# Patient Record
Sex: Female | Born: 1965 | Race: White | Hispanic: No | Marital: Single | Smoking: Current every day smoker
Health system: Southern US, Community
[De-identification: ages and names within clinical notes are randomized; demographics above are authoritative.]

## PROBLEM LIST (undated history)

## (undated) DIAGNOSIS — I5031 Acute diastolic (congestive) heart failure: Secondary | ICD-10-CM

## (undated) DIAGNOSIS — I10 Essential (primary) hypertension: Secondary | ICD-10-CM

## (undated) DIAGNOSIS — C649 Malignant neoplasm of unspecified kidney, except renal pelvis: Secondary | ICD-10-CM

## (undated) DIAGNOSIS — R011 Cardiac murmur, unspecified: Secondary | ICD-10-CM

## (undated) DIAGNOSIS — C719 Malignant neoplasm of brain, unspecified: Secondary | ICD-10-CM

## (undated) DIAGNOSIS — C169 Malignant neoplasm of stomach, unspecified: Secondary | ICD-10-CM

## (undated) DIAGNOSIS — I739 Peripheral vascular disease, unspecified: Secondary | ICD-10-CM

## (undated) DIAGNOSIS — E119 Type 2 diabetes mellitus without complications: Secondary | ICD-10-CM

## (undated) DIAGNOSIS — I6523 Occlusion and stenosis of bilateral carotid arteries: Secondary | ICD-10-CM

## (undated) HISTORY — PX: CHOLECYSTECTOMY: SHX55

---

## 2001-06-04 ENCOUNTER — Encounter: Payer: Self-pay | Admitting: Pediatrics

## 2001-06-04 ENCOUNTER — Ambulatory Visit (HOSPITAL_COMMUNITY): Admission: RE | Admit: 2001-06-04 | Discharge: 2001-06-04 | Payer: Self-pay | Admitting: Pediatrics

## 2002-05-01 ENCOUNTER — Encounter: Payer: Self-pay | Admitting: Emergency Medicine

## 2002-05-01 ENCOUNTER — Emergency Department (HOSPITAL_COMMUNITY): Admission: EM | Admit: 2002-05-01 | Discharge: 2002-05-01 | Payer: Self-pay | Admitting: Emergency Medicine

## 2002-05-01 IMAGING — CT CT PELVIS W/O CM
1 series · 15 of 32 positions shown, 19 images · non-contrast
Comparison: none

FINDINGS
CLINICAL DATA: RIGHT FLANK PAIN, NAUSEA, VOMITING.
CT ABDOMEN AND PELVIS WITHOUT CONTRAST (URINARY CALCULUS PROTOCOL)
TECHNIQUE: NEITHER INTRAVENOUS NOR ORAL CONTRAST WERE ADMINISTERED.  HELICAL CT THROUGH THE
ABDOMEN AND PELVIS WAS PERFORMED AT 5 MM SLICE THICKNESS TO INCLUDE THE KIDNEYS, URETERS AND
BLADDER.  THERE ARE NO PRIOR CT STUDIES OF THE ABDOMEN OR PELVIS AVAILABLE FOR COMPARISON.
CT ABDOMEN W/O CONTRAST
BILATERAL LOWER POLE RENAL CALCULI ARE PRESENT.  THERE IS MODERATE TO SEVERE HYDRONEPHROSIS
INVOLVING THE RIGHT KIDNEY, AND THE RIGHT KIDNEY IS EDEMATOUS.  THERE IS SIGNIFICANT PERINEPHRIC
EDEMA AS WELL.  NO PROXIMAL URETERAL CALCULI ARE IDENTIFIED.
THE GALLBLADDER IS SURGICALLY ABSENT.  WITHIN THE LIMITS OF THE UNENHANCED TECHNIQUE, THE REMAINDER
OF THE UPPER ABDOMEN IS UNREMARKABLE.
IMPRESSION
1.  OBSTRUCTION OF THE RIGHT UPPER URINARY TRACT.
2.  BILATERAL LOWER POLE RENAL CALCULI.
CT PELVIS W/O CONTRAST
NO DISTAL RIGHT URETERAL CALCULI ARE IDENTIFIED.  THERE IS AN APPROXIMATE 2 MM CALCULUS IN THE
BLADDER, CONSISTENT WITH RECENT PASSAGE.  THE UTERUS AND ADNEXA ARE UNREMARKABLE FOR AGE.  THE
COLON AND SMALL BOWEL ARE UNREMARKABLE.
RECENTLY PASSED RIGHT URINARY CALCULUS, WITH THE CALCULUS NOW PRESENT IN THE BLADDER.

[Series 1363: — · axial · 0.90mm/px · z∈[+1211,+1566]mm · 15 of 80 slices shown, 19 images]
[im 6/80  soft-tissue]
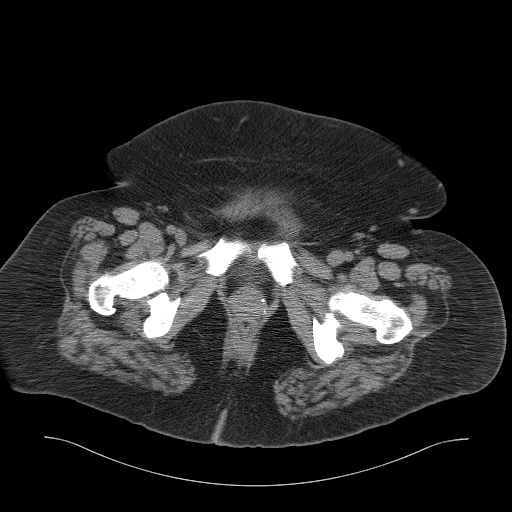
[im 6/80  bone]
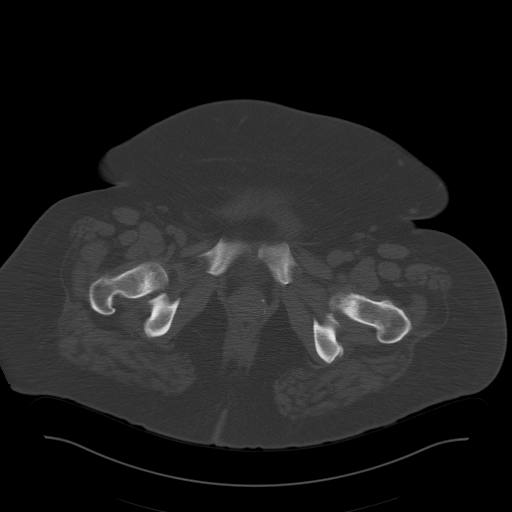
[im 11/80  soft-tissue]
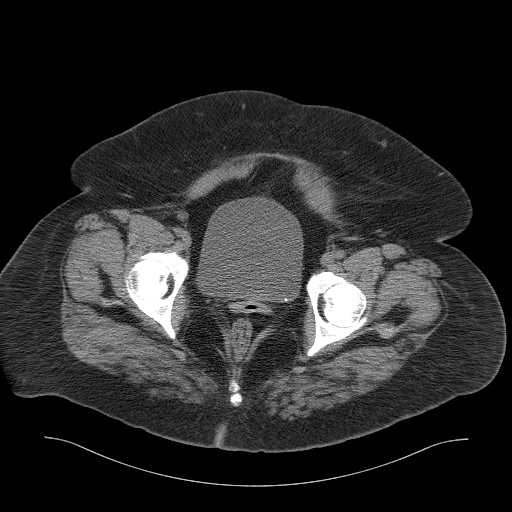
[im 16/80  soft-tissue]
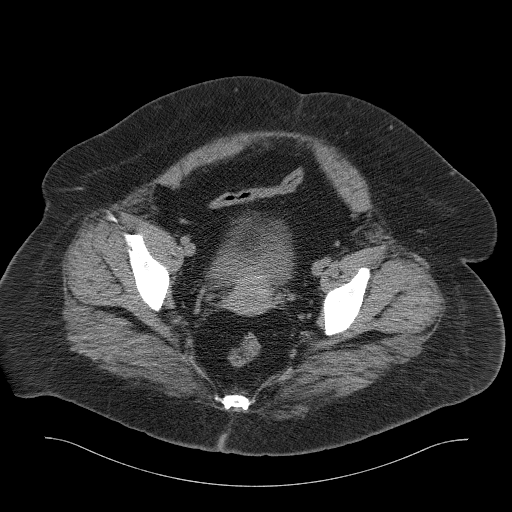
[im 23/80  soft-tissue]
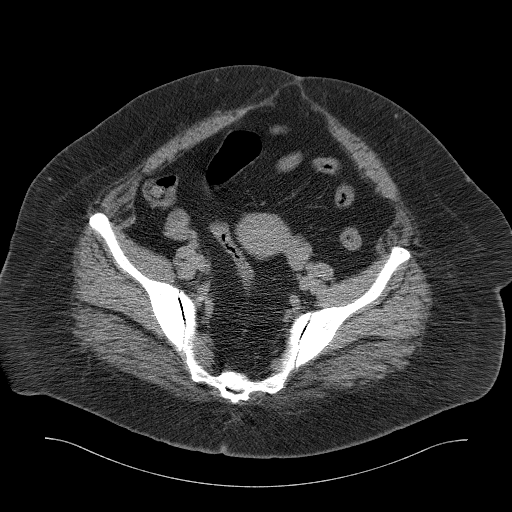
[im 29/80  soft-tissue]
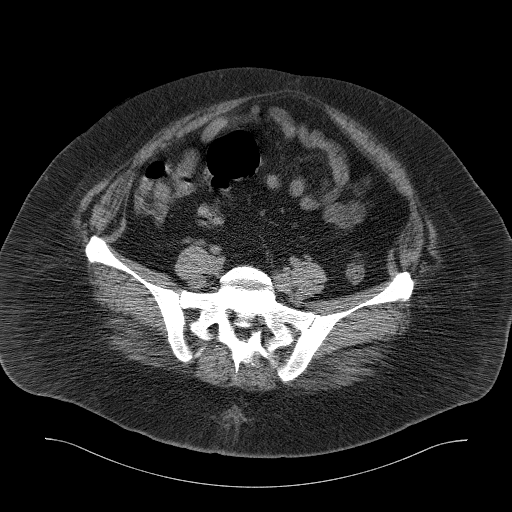
[im 34/80  soft-tissue]
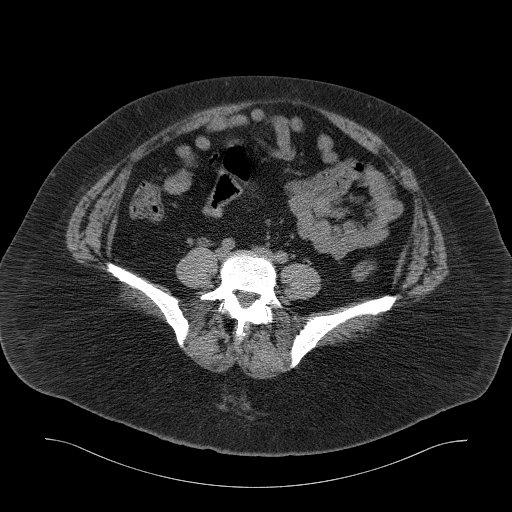
[im 41/80  soft-tissue]
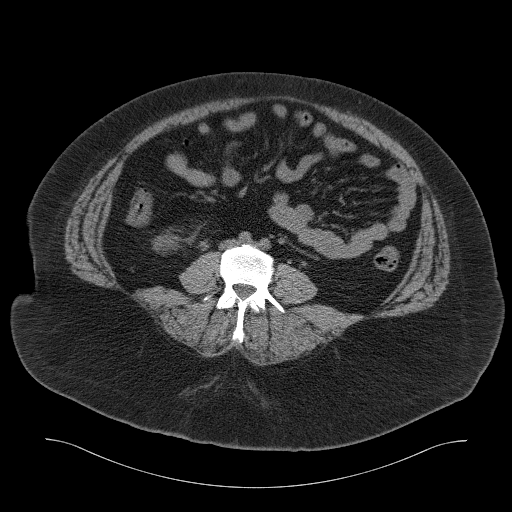
[im 46/80  soft-tissue]
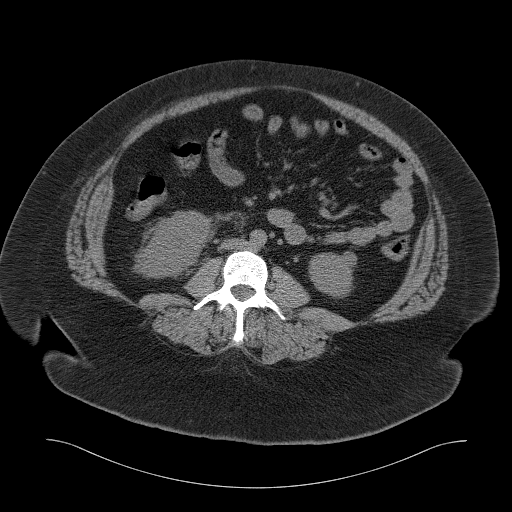
[im 51/80  soft-tissue]
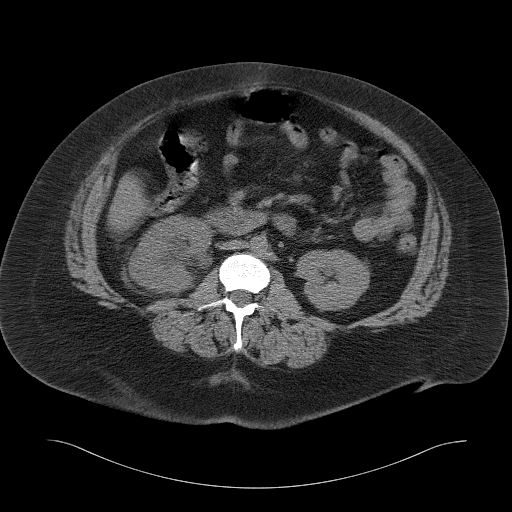
[im 51/80  bone]
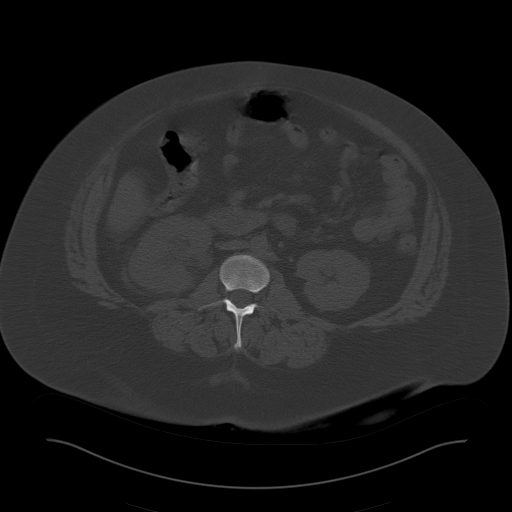
[im 57/80  soft-tissue]
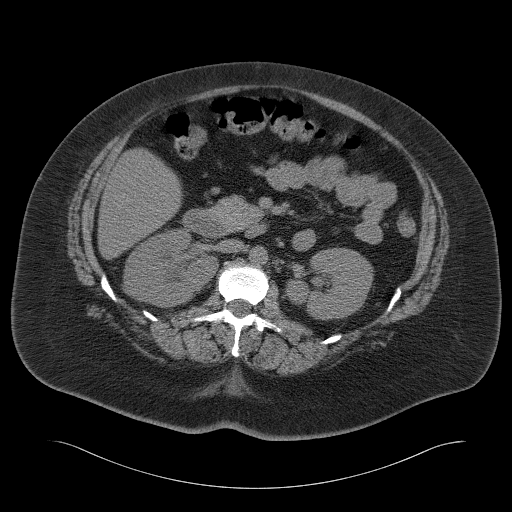
[im 64/80  soft-tissue]
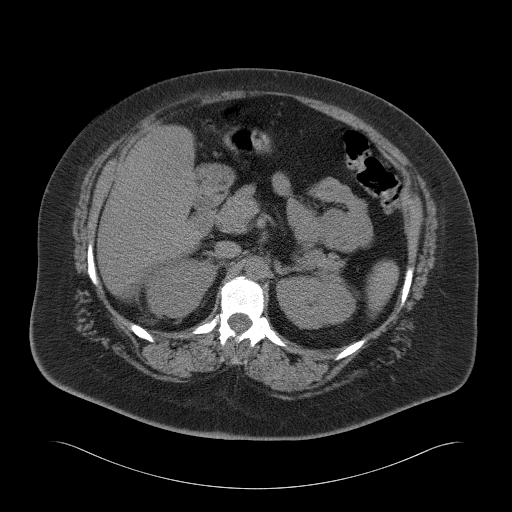
[im 69/80  soft-tissue]
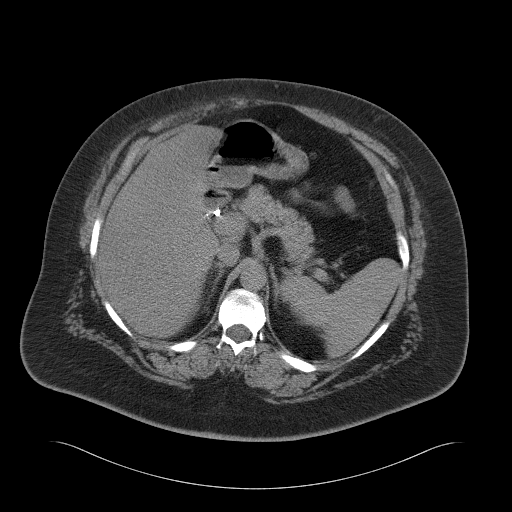
[im 69/80  lung]
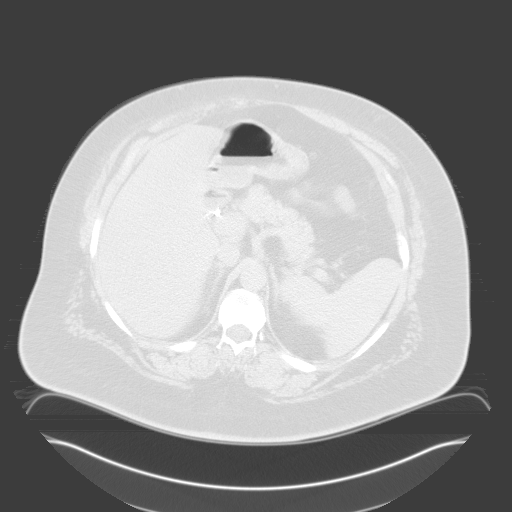
[im 72/80  lung]
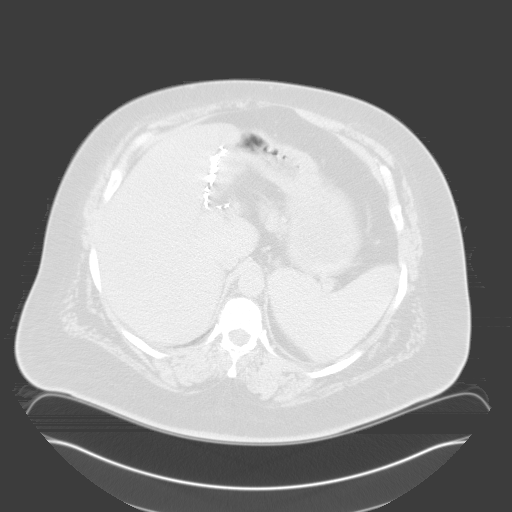
[im 74/80  soft-tissue]
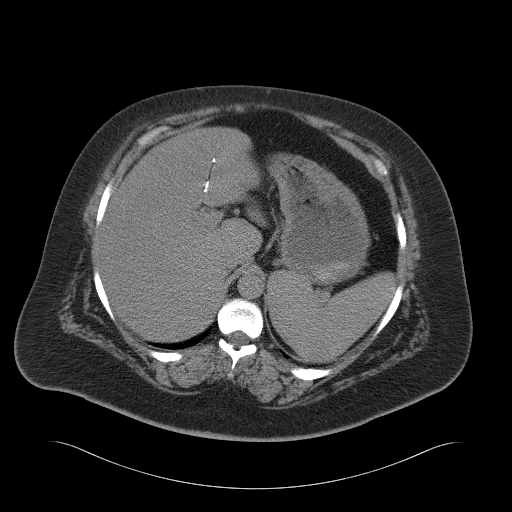
[im 74/80  lung]
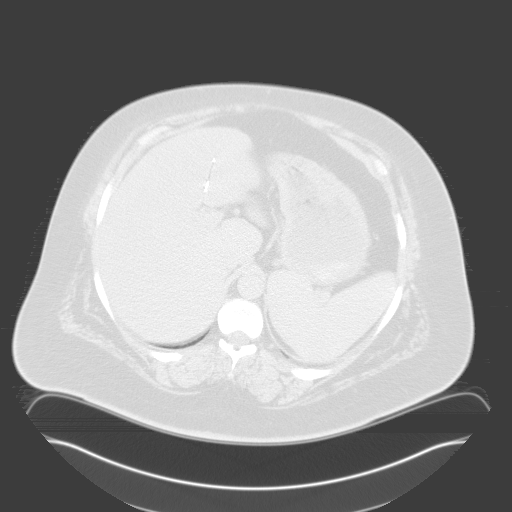
[im 77/80  lung]
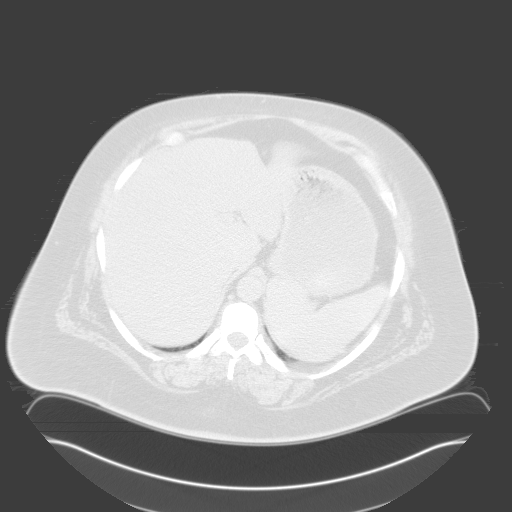

[15 of 32 positions shown; findings below may reference images not displayed]

## 2016-01-04 NOTE — Patient Instructions (Signed)
Natalie Contreras  01/04/2016     @PREFPERIOPPHARMACY @   Your procedure is scheduled on 01/11/16  Report to [facility] at [phone].  Call this number if you have problems the morning of surgery:  [phone]   Remember:  Do not eat food or drink liquids after midnight.  Take these medicines the morning of surgery with A SIP OF WATER zestoretic   Do not wear jewelry, make-up or nail polish.  Do not wear lotions, powders, or perfumes.  You may wear deodorant.  Do not shave 48 hours prior to surgery.  Men may shave face and neck.  Do not bring valuables to the hospital.  [facility] is not responsible for any belongings or valuables.  Contacts, dentures or bridgework may not be worn into surgery.  Leave your suitcase in the car.  After surgery it may be brought to your room.  For patients admitted to the hospital, discharge time will be determined by your treatment team.  Patients discharged the day of surgery will not be allowed to drive home.   Name and phone number of your driver:   family Special instructions:  none  Please read over the following fact sheets that you were given. Anesthesia Post-op Instructions      PATIENT INSTRUCTIONS POST-ANESTHESIA  IMMEDIATELY FOLLOWING SURGERY:  Do not drive or operate machinery for the first twenty four hours after surgery.  Do not make any important decisions for twenty four hours after surgery or while taking narcotic pain medications or sedatives.  If you develop intractable nausea and vomiting or a severe headache please notify your doctor immediately.  FOLLOW-UP:  Please make an appointment with your surgeon as instructed. You do not need to follow up with anesthesia unless specifically instructed to do so.  WOUND CARE INSTRUCTIONS (if applicable):  Keep a dry clean dressing on the anesthesia/puncture wound site if there is drainage.  Once the wound has quit draining you may leave it open to air.  Generally you should leave the  bandage intact for twenty four hours unless there is drainage.  If the epidural site drains for more than 36-48 hours please call the anesthesia department.  QUESTIONS?:  Please feel free to call your physician or the hospital operator if you have any questions, and they will be happy to assist you.      Cataract A cataract is a clouding of the lens of the eye. When a lens becomes cloudy, vision is reduced based on the degree and nature of the clouding. Many cataracts reduce vision to some degree. Some cataracts make people more near-sighted as they develop. Other cataracts increase glare. Cataracts that are ignored and become worse can sometimes look white. The white color can be seen through the pupil. CAUSES   Aging. However, cataracts may occur at any age, even in newborns.  Certain drugs.  Trauma to the eye.  Certain diseases such as diabetes.  Specific eye diseases such as chronic inflammation inside the eye or a sudden attack of a rare form of glaucoma.  Inherited or acquired medical problems. SYMPTOMS   Gradual, progressive drop in vision in the affected eye.  Severe, rapid visual loss. This most often happens when trauma is the cause. DIAGNOSIS  To detect a cataract, an eye doctor examines the lens. Cataracts are best diagnosed with an exam of the eyes with the pupils enlarged (dilated) by drops.  TREATMENT  For an early cataract, vision may improve by using different eyeglasses  or stronger lighting. If that does not help your vision, surgery is the only effective treatment. A cataract needs to be surgically removed when vision loss interferes with your everyday activities, such as driving, reading, or watching TV. A cataract may also have to be removed if it prevents examination or treatment of another eye problem. Surgery removes the cloudy lens and usually replaces it with a substitute lens (intraocular lens, IOL).  At a time when both you and your doctor agree, the cataract  will be surgically removed. If you have cataracts in both eyes, only one is usually removed at a time. This allows the operated eye to heal and be out of danger from any possible problems after surgery (such as infection or poor wound healing). In rare cases, a cataract may be doing damage to your eye. In these cases, your caregiver may advise surgical removal right away. The vast majority of people who have cataract surgery have better vision afterward. HOME CARE INSTRUCTIONS  If you are not planning surgery, you may be asked to do the following:  Use different eyeglasses.  Use stronger or brighter lighting.  Ask your eye doctor about reducing your medicine dose or changing medicines if it is thought that a medicine caused your cataract. Changing medicines does not make the cataract go away on its own.  Become familiar with your surroundings. Poor vision can lead to injury. Avoid bumping into things on the affected side. You are at a higher risk for tripping or falling.  Exercise extreme care when driving or operating machinery.  Wear sunglasses if you are sensitive to bright light or experiencing problems with glare. SEEK IMMEDIATE MEDICAL CARE IF:   You have a worsening or sudden vision loss.  You notice redness, swelling, or increasing pain in the eye.  You have a fever.   This information is not intended to replace advice given to you by your health care provider. Make sure you discuss any questions you have with your health care provider.   Document Released: 09/02/2005 Document Revised: 11/25/2011 Document Reviewed: 03/08/2015 Elsevier Interactive Patient Education [employer].

## 2016-01-08 ENCOUNTER — Encounter (HOSPITAL_COMMUNITY): Payer: Self-pay

## 2016-01-08 ENCOUNTER — Other Ambulatory Visit: Payer: Self-pay

## 2016-01-08 ENCOUNTER — Encounter (HOSPITAL_COMMUNITY)
Admission: RE | Admit: 2016-01-08 | Discharge: 2016-01-08 | Disposition: A | Discharge status: Home / Self Care | Payer: BLUE CROSS/BLUE SHIELD | Source: Ambulatory Visit | Attending: Ophthalmology | Admitting: Ophthalmology

## 2016-01-08 DIAGNOSIS — Z01812 Encounter for preprocedural laboratory examination: Secondary | ICD-10-CM | POA: Diagnosis not present

## 2016-01-08 DIAGNOSIS — Z01810 Encounter for preprocedural cardiovascular examination: Secondary | ICD-10-CM | POA: Insufficient documentation

## 2016-01-08 HISTORY — DX: Essential (primary) hypertension: I10

## 2016-01-08 LAB — CBC WITH DIFFERENTIAL/PLATELET
BASOS ABS: 0 10*3/uL (ref 0.0–0.1)
Basophils Relative: 1 %
EOS PCT: 2 %
Eosinophils Absolute: 0.1 10*3/uL (ref 0.0–0.7)
HCT: 45.1 % (ref 36.0–46.0)
Hemoglobin: 15.6 g/dL — ABNORMAL HIGH (ref 12.0–15.0)
LYMPHS PCT: 28 %
Lymphs Abs: 2 10*3/uL (ref 0.7–4.0)
MCH: 30.3 pg (ref 26.0–34.0)
MCHC: 34.6 g/dL (ref 30.0–36.0)
MCV: 87.6 fL (ref 78.0–100.0)
MONO ABS: 0.3 10*3/uL (ref 0.1–1.0)
MONOS PCT: 4 %
Neutro Abs: 4.6 10*3/uL (ref 1.7–7.7)
Neutrophils Relative %: 65 %
PLATELETS: 300 10*3/uL (ref 150–400)
RBC: 5.15 MIL/uL — ABNORMAL HIGH (ref 3.87–5.11)
RDW: 13.5 % (ref 11.5–15.5)
WBC: 7 10*3/uL (ref 4.0–10.5)

## 2016-01-08 LAB — BASIC METABOLIC PANEL
Anion gap: 10 (ref 5–15)
BUN: 25 mg/dL — ABNORMAL HIGH (ref 6–20)
CO2: 26 mmol/L (ref 22–32)
Calcium: 9.3 mg/dL (ref 8.9–10.3)
Chloride: 97 mmol/L — ABNORMAL LOW (ref 101–111)
Creatinine, Ser: 0.79 mg/dL (ref 0.44–1.00)
GFR calc Af Amer: >60 mL/min (ref >60)
GFR calc non Af Amer: >60 mL/min (ref >60)
Glucose, Bld: 533 mg/dL — ABNORMAL HIGH (ref 65–99)
Potassium: 4.1 mmol/L (ref 3.5–5.1)
Sodium: 133 mmol/L — ABNORMAL LOW (ref 135–145)

## 2016-01-08 LAB — HCG, SERUM, QUALITATIVE: Preg, Serum: NEGATIVE

## 2016-01-08 NOTE — Pre-Procedure Instructions (Signed)
Patient given information to sign up for my chart at home. 

## 2016-01-09 NOTE — Pre-Procedure Instructions (Signed)
Glucose on PAT is 533. Patient denies having diabetes. Dr Rochel Brome made aware of glucose. Patient's surgery will be cancelled pending treatment for diabetes. Called patient's PCP, Richardine Service [phone]. They are to see patient tomorrow 10 January 2016 at 1415  concerning glucose. Called Mrs. Cawley and spoke with her about this appointment as well as cancellation of surgery and reason why. Spoke with her briefly about diabetes, complications as well as diet that should be followed. [place] and spoke with Wannetta Sender and let her know surgery is cancelled for now and that patient will call and rescheduled when Patient's PCP thinks this is ok after being treated for diabetes.

## 2016-01-10 ENCOUNTER — Other Ambulatory Visit (HOSPITAL_COMMUNITY): Payer: Self-pay | Admitting: Nurse Practitioner

## 2016-01-10 DIAGNOSIS — R0989 Other specified symptoms and signs involving the circulatory and respiratory systems: Secondary | ICD-10-CM

## 2016-01-10 DIAGNOSIS — E785 Hyperlipidemia, unspecified: Secondary | ICD-10-CM

## 2016-01-10 DIAGNOSIS — E118 Type 2 diabetes mellitus with unspecified complications: Principal | ICD-10-CM

## 2016-01-10 DIAGNOSIS — IMO0002 Reserved for concepts with insufficient information to code with codable children: Secondary | ICD-10-CM

## 2016-01-10 DIAGNOSIS — E1165 Type 2 diabetes mellitus with hyperglycemia: Secondary | ICD-10-CM

## 2016-01-11 ENCOUNTER — Encounter (HOSPITAL_COMMUNITY): Admission: RE | Payer: Self-pay | Source: Ambulatory Visit

## 2016-01-11 ENCOUNTER — Ambulatory Visit (HOSPITAL_COMMUNITY): Admission: RE | Admit: 2016-01-11 | Payer: BLUE CROSS/BLUE SHIELD | Source: Ambulatory Visit | Admitting: Ophthalmology

## 2016-01-11 SURGERY — PHACOEMULSIFICATION, CATARACT, WITH IOL INSERTION
Anesthesia: Monitor Anesthesia Care | Site: Eye | Laterality: Left

## 2016-01-15 ENCOUNTER — Ambulatory Visit (HOSPITAL_COMMUNITY)
Admission: RE | Admit: 2016-01-15 | Discharge: 2016-01-15 | Disposition: A | Discharge status: Home / Self Care | Payer: BLUE CROSS/BLUE SHIELD | Source: Ambulatory Visit | Attending: Nurse Practitioner | Admitting: Nurse Practitioner

## 2016-01-15 DIAGNOSIS — R0989 Other specified symptoms and signs involving the circulatory and respiratory systems: Secondary | ICD-10-CM | POA: Diagnosis present

## 2016-01-15 DIAGNOSIS — R938 Abnormal findings on diagnostic imaging of other specified body structures: Secondary | ICD-10-CM | POA: Insufficient documentation

## 2016-01-15 DIAGNOSIS — E785 Hyperlipidemia, unspecified: Secondary | ICD-10-CM | POA: Diagnosis present

## 2016-01-15 DIAGNOSIS — E1165 Type 2 diabetes mellitus with hyperglycemia: Secondary | ICD-10-CM

## 2016-01-15 DIAGNOSIS — E118 Type 2 diabetes mellitus with unspecified complications: Secondary | ICD-10-CM

## 2016-01-15 DIAGNOSIS — IMO0002 Reserved for concepts with insufficient information to code with codable children: Secondary | ICD-10-CM

## 2016-02-02 ENCOUNTER — Encounter (HOSPITAL_COMMUNITY): Payer: Self-pay

## 2016-02-05 ENCOUNTER — Encounter (HOSPITAL_COMMUNITY)
Admission: RE | Admit: 2016-02-05 | Discharge: 2016-02-05 | Disposition: A | Discharge status: Home / Self Care | Payer: BLUE CROSS/BLUE SHIELD | Source: Ambulatory Visit | Attending: Ophthalmology | Admitting: Ophthalmology

## 2016-02-05 HISTORY — DX: Type 2 diabetes mellitus without complications: E11.9

## 2016-02-08 ENCOUNTER — Ambulatory Visit (HOSPITAL_COMMUNITY): Payer: BLUE CROSS/BLUE SHIELD | Admitting: Anesthesiology

## 2016-02-08 ENCOUNTER — Encounter (HOSPITAL_COMMUNITY)
Admission: RE | Disposition: A | Discharge status: Home / Self Care | Payer: Self-pay | Source: Ambulatory Visit | Attending: Ophthalmology

## 2016-02-08 ENCOUNTER — Encounter (HOSPITAL_COMMUNITY): Payer: Self-pay | Admitting: *Deleted

## 2016-02-08 ENCOUNTER — Ambulatory Visit (HOSPITAL_COMMUNITY)
Admission: RE | Admit: 2016-02-08 | Discharge: 2016-02-08 | Disposition: A | Discharge status: Home / Self Care | Payer: BLUE CROSS/BLUE SHIELD | Source: Ambulatory Visit | Attending: Ophthalmology | Admitting: Ophthalmology

## 2016-02-08 DIAGNOSIS — Z7982 Long term (current) use of aspirin: Secondary | ICD-10-CM | POA: Insufficient documentation

## 2016-02-08 DIAGNOSIS — F17210 Nicotine dependence, cigarettes, uncomplicated: Secondary | ICD-10-CM | POA: Insufficient documentation

## 2016-02-08 DIAGNOSIS — E119 Type 2 diabetes mellitus without complications: Secondary | ICD-10-CM | POA: Insufficient documentation

## 2016-02-08 DIAGNOSIS — Z7984 Long term (current) use of oral hypoglycemic drugs: Secondary | ICD-10-CM | POA: Insufficient documentation

## 2016-02-08 DIAGNOSIS — H25812 Combined forms of age-related cataract, left eye: Secondary | ICD-10-CM | POA: Insufficient documentation

## 2016-02-08 DIAGNOSIS — Z79899 Other long term (current) drug therapy: Secondary | ICD-10-CM | POA: Diagnosis not present

## 2016-02-08 DIAGNOSIS — I10 Essential (primary) hypertension: Secondary | ICD-10-CM | POA: Insufficient documentation

## 2016-02-08 HISTORY — PX: CATARACT EXTRACTION W/PHACO: SHX586

## 2016-02-08 LAB — PREGNANCY, URINE: Preg Test, Ur: NEGATIVE

## 2016-02-08 LAB — GLUCOSE, CAPILLARY: Glucose-Capillary: 302 mg/dL — ABNORMAL HIGH (ref 65–99)

## 2016-02-08 SURGERY — PHACOEMULSIFICATION, CATARACT, WITH IOL INSERTION
Anesthesia: Monitor Anesthesia Care | Site: Eye | Laterality: Left

## 2016-02-08 MED ORDER — EPINEPHRINE HCL 1 MG/ML IJ SOLN
INTRAMUSCULAR | Status: AC
  Filled 2016-02-08: qty 1

## 2016-02-08 MED ORDER — NEOMYCIN-POLYMYXIN-DEXAMETH 3.5-10000-0.1 OP SUSP
OPHTHALMIC | Status: DC | PRN
  Administered 2016-02-08: 2 [drp] via OPHTHALMIC

## 2016-02-08 MED ORDER — FENTANYL CITRATE (PF) 100 MCG/2ML IJ SOLN
25.0000 ug | INTRAMUSCULAR | Status: AC
  Administered 2016-02-08 (×2): 25 ug via INTRAVENOUS

## 2016-02-08 MED ORDER — POVIDONE-IODINE 5 % OP SOLN
OPHTHALMIC | Status: DC | PRN
  Administered 2016-02-08: 1 via OPHTHALMIC

## 2016-02-08 MED ORDER — PROVISC 10 MG/ML IO SOLN
INTRAOCULAR | Status: DC | PRN
  Administered 2016-02-08: 0.85 mL via INTRAOCULAR

## 2016-02-08 MED ORDER — EPINEPHRINE HCL 1 MG/ML IJ SOLN
INTRAOCULAR | Status: DC | PRN
  Administered 2016-02-08: 500 mL

## 2016-02-08 MED ORDER — LACTATED RINGERS IV SOLN
INTRAVENOUS | Status: DC
  Administered 2016-02-08: 07:00:00 via INTRAVENOUS

## 2016-02-08 MED ORDER — MIDAZOLAM HCL 2 MG/2ML IJ SOLN
1.0000 mg | INTRAMUSCULAR | Status: DC | PRN
  Administered 2016-02-08: 2 mg via INTRAVENOUS

## 2016-02-08 MED ORDER — FENTANYL CITRATE (PF) 100 MCG/2ML IJ SOLN
INTRAMUSCULAR | Status: AC
Start: 2016-02-08 — End: 2016-02-08
  Filled 2016-02-08: qty 2

## 2016-02-08 MED ORDER — PHENYLEPHRINE HCL 2.5 % OP SOLN
1.0000 [drp] | OPHTHALMIC | Status: AC
  Administered 2016-02-08 (×3): 1 [drp] via OPHTHALMIC

## 2016-02-08 MED ORDER — ONDANSETRON HCL 4 MG/2ML IJ SOLN: 4.0000 mg | Freq: Once | INTRAMUSCULAR | Status: DC | PRN

## 2016-02-08 MED ORDER — TETRACAINE HCL 0.5 % OP SOLN
1.0000 [drp] | OPHTHALMIC | Status: AC
  Administered 2016-02-08 (×3): 1 [drp] via OPHTHALMIC

## 2016-02-08 MED ORDER — BSS IO SOLN
INTRAOCULAR | Status: DC | PRN
  Administered 2016-02-08: 15 mL

## 2016-02-08 MED ORDER — LIDOCAINE HCL 3.5 % OP GEL
1.0000 "application " | Freq: Once | OPHTHALMIC | Status: AC
  Administered 2016-02-08: 1 via OPHTHALMIC

## 2016-02-08 MED ORDER — LIDOCAINE HCL (PF) 1 % IJ SOLN
INTRAOCULAR | Status: DC | PRN
  Administered 2016-02-08: .8 mL via OPHTHALMIC

## 2016-02-08 MED ORDER — MIDAZOLAM HCL 2 MG/2ML IJ SOLN
INTRAMUSCULAR | Status: AC
  Filled 2016-02-08: qty 2

## 2016-02-08 MED ORDER — FENTANYL CITRATE (PF) 100 MCG/2ML IJ SOLN: 25.0000 ug | INTRAMUSCULAR | Status: DC | PRN

## 2016-02-08 MED ORDER — CYCLOPENTOLATE-PHENYLEPHRINE 0.2-1 % OP SOLN
1.0000 [drp] | OPHTHALMIC | Status: AC
  Administered 2016-02-08 (×3): 1 [drp] via OPHTHALMIC

## 2016-02-08 SURGICAL SUPPLY — 23 items
CAPSULAR TENSION RING-AMO (OPHTHALMIC RELATED) IMPLANT
CLOTH BEACON ORANGE TIMEOUT ST (SAFETY) ×2 IMPLANT
EYE SHIELD UNIVERSAL CLEAR (GAUZE/BANDAGES/DRESSINGS) ×2 IMPLANT
GLOVE BIOGEL PI IND STRL 7.0 (GLOVE) ×2 IMPLANT
GLOVE BIOGEL PI IND STRL 7.5 (GLOVE) IMPLANT
GLOVE BIOGEL PI INDICATOR 7.0 (GLOVE) ×2
GLOVE BIOGEL PI INDICATOR 7.5 (GLOVE)
GLOVE EXAM NITRILE LRG STRL (GLOVE) IMPLANT
GLOVE EXAM NITRILE MD LF STRL (GLOVE) IMPLANT
KIT VITRECTOMY (OPHTHALMIC RELATED) IMPLANT
PAD ARMBOARD 7.5X6 YLW CONV (MISCELLANEOUS) ×2 IMPLANT
PROC W NO LENS (INTRAOCULAR LENS)
PROC W SPEC LENS (INTRAOCULAR LENS)
PROCESS W NO LENS (INTRAOCULAR LENS) IMPLANT
PROCESS W SPEC LENS (INTRAOCULAR LENS) IMPLANT
RETRACTOR IRIS SIGHTPATH (OPHTHALMIC RELATED) IMPLANT
RING MALYGIN (MISCELLANEOUS) IMPLANT
SIGHTPATH CAT PROC W REG LENS (Ophthalmic Related) ×2 IMPLANT
SYRINGE LUER LOK 1CC (MISCELLANEOUS) ×2 IMPLANT
TAPE SURG TRANSPORE 1 IN (GAUZE/BANDAGES/DRESSINGS) ×1 IMPLANT
TAPE SURGICAL TRANSPORE 1 IN (GAUZE/BANDAGES/DRESSINGS) ×1
VISCOELASTIC ADDITIONAL (OPHTHALMIC RELATED) IMPLANT
WATER STERILE IRR 250ML POUR (IV SOLUTION) ×2 IMPLANT

## 2016-02-08 NOTE — OR Nursing (Signed)
Glasses  Given to friend by  patient

## 2016-02-08 NOTE — Anesthesia Postprocedure Evaluation (Signed)
Anesthesia Post Note  Patient: Natalie Contreras  Procedure(s) Performed: Procedure(s) (LRB): CATARACT EXTRACTION PHACO AND INTRAOCULAR LENS PLACEMENT (IOC) (Left)  Patient location during evaluation: Short Stay Anesthesia Type: MAC Level of consciousness: awake and alert and oriented Pain management: pain level controlled Vital Signs Assessment: post-procedure vital signs reviewed and stable Respiratory status: spontaneous breathing Cardiovascular status: stable Postop Assessment: no signs of nausea or vomiting Anesthetic complications: no    Last Vitals:  Filed Vitals:   02/08/16 0755 02/08/16 0800  BP: 129/66   Temp:    Resp: 14 15    Last Pain: There were no vitals filed for this visit.               ADAMS, AMY A

## 2016-02-08 NOTE — Transfer of Care (Signed)
Immediate Anesthesia Transfer of Care Note  Patient: Natalie Contreras  Procedure(s) Performed: Procedure(s) with comments: CATARACT EXTRACTION PHACO AND INTRAOCULAR LENS PLACEMENT (IOC) (Left) - CDE 11.43   Patient Location: Short Stay  Anesthesia Type:MAC  Level of Consciousness: awake, alert , oriented and patient cooperative  Airway & Oxygen Therapy: Patient Spontanous Breathing  Post-op Assessment: Report given to RN and Post -op Vital signs reviewed and stable  Post vital signs: Reviewed and stable  Last Vitals:  Filed Vitals:   02/08/16 0755 02/08/16 0800  BP: 129/66   Temp:    Resp: 14 15    Last Pain: There were no vitals filed for this visit.    Patients Stated Pain Goal: 8 ([id number])  Complications: No apparent anesthesia complications

## 2016-02-08 NOTE — Discharge Instructions (Signed)

## 2016-02-08 NOTE — Anesthesia Preprocedure Evaluation (Signed)
Anesthesia Evaluation  Patient identified by MRN, date of birth, ID band Patient awake    Reviewed: Allergy & Precautions, NPO status , Patient's Chart, lab work & pertinent test results  Airway Mallampati: I  TM Distance: >3 FB     Dental  (+) Poor Dentition, Missing, Chipped   Pulmonary Current Smoker,    breath sounds clear to auscultation       Cardiovascular hypertension, Pt. on medications  Rhythm:Regular Rate:Normal     Neuro/Psych    GI/Hepatic   Endo/Other  diabetes, Poorly Controlled, Type 2, Oral Hypoglycemic Agents  Renal/GU      Musculoskeletal   Abdominal   Peds  Hematology   Anesthesia Other Findings   Reproductive/Obstetrics                             Anesthesia Physical Anesthesia Plan  ASA: III  Anesthesia Plan: MAC   Post-op Pain Management:    Induction: Intravenous  Airway Management Planned: Nasal Cannula  Additional Equipment:   Intra-op Plan:   Post-operative Plan:   Informed Consent: I have reviewed the patients History and Physical, chart, labs and discussed the procedure including the risks, benefits and alternatives for the proposed anesthesia with the patient or authorized representative who has indicated his/her understanding and acceptance.     Plan Discussed with:   Anesthesia Plan Comments:         Anesthesia Quick Evaluation

## 2016-02-08 NOTE — H&P (Signed)
I have reviewed the H&P, the patient was re-examined, and I have identified no interval changes in medical condition and plan of care since the history and physical of record  

## 2016-02-08 NOTE — Op Note (Signed)
Date of Admission: 02/08/2016  Date of Surgery: 02/08/2016   Pre-Op Dx: Cataract Left Eye  Post-Op Dx: Senile Combined Cataract Left  Eye,  Dx Code [id number]  Surgeon: Tonny Branch, M.D.  Assistants: None  Anesthesia: Topical with MAC  Indications: Painless, progressive loss of vision with compromise of daily activities.  Surgery: Cataract Extraction with Intraocular lens Implant Left Eye  Discription: The patient had dilating drops and viscous lidocaine placed into the Left eye in the pre-op holding area. After transfer to the operating room, a time out was performed. The patient was then prepped and draped. Beginning with a 55 degree blade a paracentesis port was made at the surgeon's 2 o'clock position. The anterior chamber was then filled with 1% non-preserved lidocaine with epinepherine. This was followed by filling the anterior chamber with Provisc.  A 2.29mm keratome blade was used to make a clear corneal incision at the temporal limbus.  A bent cystatome needle was used to create a continuous tear capsulotomy. Hydrodissection was performed with balanced salt solution on a Fine canula. The lens nucleus was then removed using the phacoemulsification handpiece. Residual cortex was removed with the I&A handpiece. The anterior chamber and capsular bag were refilled with Provisc. A posterior chamber intraocular lens was placed into the capsular bag with it's injector. The implant was positioned with the Kuglan hook. The Provisc was then removed from the anterior chamber and capsular bag with the I&A handpiece. Stromal hydration of the main incision and paracentesis port was performed with BSS on a Fine canula. The wounds were tested for leak which was negative. The patient tolerated the procedure well. There were no operative complications. The patient was then transferred to the recovery room in stable condition.  Complications: None  Specimen: None  EBL: None  Prosthetic device: Hoya iSert  250, power 18.0 D, SN [id number].

## 2016-02-08 NOTE — Anesthesia Procedure Notes (Signed)
Procedure Name: MAC Date/Time: 02/08/2016 8:01 AM Performed by: Andree Elk, Melchor Kirchgessner A Pre-anesthesia Checklist: Patient identified, Emergency Drugs available, Suction available, Timeout performed and Patient being monitored Patient Re-evaluated:Patient Re-evaluated prior to inductionOxygen Delivery Method: Nasal Cannula

## 2016-02-09 ENCOUNTER — Encounter (HOSPITAL_COMMUNITY): Payer: Self-pay | Admitting: Ophthalmology

## 2016-02-23 ENCOUNTER — Encounter (HOSPITAL_COMMUNITY)
Admission: RE | Admit: 2016-02-23 | Discharge: 2016-02-23 | Disposition: A | Discharge status: Home / Self Care | Payer: BLUE CROSS/BLUE SHIELD | Source: Ambulatory Visit | Attending: Ophthalmology | Admitting: Ophthalmology

## 2016-02-26 ENCOUNTER — Encounter (HOSPITAL_COMMUNITY)
Admission: RE | Disposition: A | Discharge status: Home / Self Care | Payer: Self-pay | Source: Ambulatory Visit | Attending: Ophthalmology

## 2016-02-26 ENCOUNTER — Ambulatory Visit (HOSPITAL_COMMUNITY): Payer: BLUE CROSS/BLUE SHIELD | Admitting: Anesthesiology

## 2016-02-26 ENCOUNTER — Encounter (HOSPITAL_COMMUNITY): Payer: Self-pay | Admitting: *Deleted

## 2016-02-26 ENCOUNTER — Ambulatory Visit (HOSPITAL_COMMUNITY)
Admission: RE | Admit: 2016-02-26 | Discharge: 2016-02-26 | Disposition: A | Discharge status: Home / Self Care | Payer: BLUE CROSS/BLUE SHIELD | Source: Ambulatory Visit | Attending: Ophthalmology | Admitting: Ophthalmology

## 2016-02-26 DIAGNOSIS — I10 Essential (primary) hypertension: Secondary | ICD-10-CM | POA: Diagnosis not present

## 2016-02-26 DIAGNOSIS — Z7984 Long term (current) use of oral hypoglycemic drugs: Secondary | ICD-10-CM | POA: Diagnosis not present

## 2016-02-26 DIAGNOSIS — Z7982 Long term (current) use of aspirin: Secondary | ICD-10-CM | POA: Insufficient documentation

## 2016-02-26 DIAGNOSIS — H2181 Floppy iris syndrome: Secondary | ICD-10-CM | POA: Insufficient documentation

## 2016-02-26 DIAGNOSIS — E119 Type 2 diabetes mellitus without complications: Secondary | ICD-10-CM | POA: Insufficient documentation

## 2016-02-26 DIAGNOSIS — F17200 Nicotine dependence, unspecified, uncomplicated: Secondary | ICD-10-CM | POA: Diagnosis not present

## 2016-02-26 DIAGNOSIS — Z79899 Other long term (current) drug therapy: Secondary | ICD-10-CM | POA: Diagnosis not present

## 2016-02-26 DIAGNOSIS — H268 Other specified cataract: Secondary | ICD-10-CM | POA: Diagnosis present

## 2016-02-26 HISTORY — PX: CATARACT EXTRACTION W/PHACO: SHX586

## 2016-02-26 LAB — GLUCOSE, CAPILLARY: Glucose-Capillary: 281 mg/dL — ABNORMAL HIGH (ref 65–99)

## 2016-02-26 LAB — PREGNANCY, URINE: PREG TEST UR: NEGATIVE

## 2016-02-26 SURGERY — PHACOEMULSIFICATION, CATARACT, WITH IOL INSERTION
Anesthesia: Monitor Anesthesia Care | Site: Eye | Laterality: Right

## 2016-02-26 MED ORDER — PROVISC 10 MG/ML IO SOLN
INTRAOCULAR | Status: DC | PRN
  Administered 2016-02-26: 0.85 mL via INTRAOCULAR

## 2016-02-26 MED ORDER — TETRACAINE HCL 0.5 % OP SOLN
1.0000 [drp] | OPHTHALMIC | Status: AC
  Administered 2016-02-26 (×3): 1 [drp] via OPHTHALMIC

## 2016-02-26 MED ORDER — BSS IO SOLN
INTRAOCULAR | Status: DC | PRN
  Administered 2016-02-26: 15 mL via INTRAOCULAR

## 2016-02-26 MED ORDER — PHENYLEPHRINE HCL 2.5 % OP SOLN
1.0000 [drp] | OPHTHALMIC | Status: AC
  Administered 2016-02-26 (×3): 1 [drp] via OPHTHALMIC

## 2016-02-26 MED ORDER — LACTATED RINGERS IV SOLN
INTRAVENOUS | Status: DC
  Administered 2016-02-26: 10:00:00 via INTRAVENOUS

## 2016-02-26 MED ORDER — EPINEPHRINE HCL 1 MG/ML IJ SOLN
INTRAMUSCULAR | Status: AC
  Filled 2016-02-26: qty 1

## 2016-02-26 MED ORDER — MIDAZOLAM HCL 2 MG/2ML IJ SOLN
1.0000 mg | INTRAMUSCULAR | Status: DC | PRN
  Administered 2016-02-26: 2 mg via INTRAVENOUS

## 2016-02-26 MED ORDER — MIDAZOLAM HCL 2 MG/2ML IJ SOLN
INTRAMUSCULAR | Status: AC
  Filled 2016-02-26: qty 2

## 2016-02-26 MED ORDER — LIDOCAINE 3.5 % OP GEL OPTIME - NO CHARGE
OPHTHALMIC | Status: DC | PRN
  Administered 2016-02-26: 1 [drp] via OPHTHALMIC

## 2016-02-26 MED ORDER — FENTANYL CITRATE (PF) 100 MCG/2ML IJ SOLN
25.0000 ug | INTRAMUSCULAR | Status: AC | PRN
  Administered 2016-02-26: 25 ug via INTRAVENOUS
  Administered 2016-02-26: 10:00:00 via INTRAVENOUS

## 2016-02-26 MED ORDER — NEOMYCIN-POLYMYXIN-DEXAMETH 3.5-10000-0.1 OP SUSP
OPHTHALMIC | Status: DC | PRN
  Administered 2016-02-26: 2 [drp] via OPHTHALMIC

## 2016-02-26 MED ORDER — EPINEPHRINE HCL 1 MG/ML IJ SOLN
INTRAOCULAR | Status: DC | PRN
  Administered 2016-02-26: 500 mL

## 2016-02-26 MED ORDER — POVIDONE-IODINE 5 % OP SOLN
OPHTHALMIC | Status: DC | PRN
  Administered 2016-02-26: 1 via OPHTHALMIC

## 2016-02-26 MED ORDER — LIDOCAINE HCL (PF) 1 % IJ SOLN
INTRAOCULAR | Status: DC | PRN
  Administered 2016-02-26 (×2): .8 mL via OPHTHALMIC

## 2016-02-26 MED ORDER — LIDOCAINE HCL 3.5 % OP GEL
1.0000 "application " | Freq: Once | OPHTHALMIC | Status: AC
  Administered 2016-02-26: 1 via OPHTHALMIC

## 2016-02-26 MED ORDER — FENTANYL CITRATE (PF) 100 MCG/2ML IJ SOLN
INTRAMUSCULAR | Status: AC
  Filled 2016-02-26: qty 2

## 2016-02-26 MED ORDER — CYCLOPENTOLATE-PHENYLEPHRINE 0.2-1 % OP SOLN
1.0000 [drp] | OPHTHALMIC | Status: AC
  Administered 2016-02-26 (×3): 1 [drp] via OPHTHALMIC

## 2016-02-26 SURGICAL SUPPLY — 23 items
CAPSULAR TENSION RING-AMO (OPHTHALMIC RELATED) IMPLANT
CLOTH BEACON ORANGE TIMEOUT ST (SAFETY) ×2 IMPLANT
EYE SHIELD UNIVERSAL CLEAR (GAUZE/BANDAGES/DRESSINGS) ×2 IMPLANT
GLOVE BIOGEL PI IND STRL 7.0 (GLOVE) ×1 IMPLANT
GLOVE BIOGEL PI IND STRL 7.5 (GLOVE) IMPLANT
GLOVE BIOGEL PI INDICATOR 7.0 (GLOVE) ×1
GLOVE BIOGEL PI INDICATOR 7.5 (GLOVE)
GLOVE EXAM NITRILE LRG STRL (GLOVE) IMPLANT
GLOVE EXAM NITRILE MD LF STRL (GLOVE) ×2 IMPLANT
KIT VITRECTOMY (OPHTHALMIC RELATED) IMPLANT
PAD ARMBOARD 7.5X6 YLW CONV (MISCELLANEOUS) ×2 IMPLANT
PROC W NO LENS (INTRAOCULAR LENS)
PROC W SPEC LENS (INTRAOCULAR LENS)
PROCESS W NO LENS (INTRAOCULAR LENS) IMPLANT
PROCESS W SPEC LENS (INTRAOCULAR LENS) IMPLANT
RETRACTOR IRIS SIGHTPATH (OPHTHALMIC RELATED) IMPLANT
RING MALYGIN (MISCELLANEOUS) IMPLANT
SIGHTPATH CAT PROC W REG LENS (Ophthalmic Related) ×2 IMPLANT
SYRINGE LUER LOK 1CC (MISCELLANEOUS) ×2 IMPLANT
TAPE SURG TRANSPORE 1 IN (GAUZE/BANDAGES/DRESSINGS) ×1 IMPLANT
TAPE SURGICAL TRANSPORE 1 IN (GAUZE/BANDAGES/DRESSINGS) ×1
VISCOELASTIC ADDITIONAL (OPHTHALMIC RELATED) ×2 IMPLANT
WATER STERILE IRR 250ML POUR (IV SOLUTION) ×2 IMPLANT

## 2016-02-26 NOTE — Anesthesia Postprocedure Evaluation (Signed)
Anesthesia Post Note  Patient: Natalie Contreras  Procedure(s) Performed: Procedure(s) (LRB): CATARACT EXTRACTION PHACO AND INTRAOCULAR LENS PLACEMENT RIGHT; CDE:  16.90 (Right)  Patient location during evaluation: Short Stay Anesthesia Type: MAC Level of consciousness: awake and alert Pain management: pain level controlled Vital Signs Assessment: post-procedure vital signs reviewed and stable Respiratory status: spontaneous breathing Cardiovascular status: blood pressure returned to baseline Postop Assessment: no signs of nausea or vomiting Anesthetic complications: no    Last Vitals:  Filed Vitals:   02/26/16 1005 02/26/16 1010  BP: 149/69 135/67  Resp: 16 16    Last Pain: There were no vitals filed for this visit.               Ural Acree

## 2016-02-26 NOTE — H&P (Signed)
I have reviewed the H&P, the patient was re-examined, and I have identified no interval changes in medical condition and plan of care since the history and physical of record  

## 2016-02-26 NOTE — Discharge Instructions (Signed)

## 2016-02-26 NOTE — Op Note (Signed)
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  Assistants: None  Anesthesia: Topical with MAC  Indications: Painless, progressive loss of vision with compromise of daily activities.  Surgery: Cataract Extraction with Intraocular lens Implant Right Eye, CPT Code [phone]  Discription: The patient had dilating drops and viscous lidocaine placed into the left eye in the pre-op holding area. After transfer to the operating room, a time out was performed. The patient was then prepped and draped. Beginning with a 27 degree blade a paracentesis port was made at the surgeon's 2 o'clock position. The anterior chamber was then filled with 1% non-preserved lidocaine with epinepherine. This was followed by filling the anterior chamber with Provisc. The pupil was less than 5.62mm so a Malyugan ring was placed into the anterior chamber using its injector. The loops were positioned with the Kuglan hook. A bent cystatome needle was used to create a continuous tear capsulotomy. Hydrodissection was performed with balanced salt solution on a Fine canula. The lens nucleus was then removed using the phacoemulsification handpiece. Residual cortex was removed with the I&A handpiece. The anterior chamber and capsular bag were refilled with Provisc. A posterior chamber intraocular lens was placed into the capsular bag with it's injector. The implant was positioned with the Kuglan hook. The Malyugan ring was disengaged from the iris margin and removed with its injector. The Provisc was then removed from the anterior chamber and capsular bag with the I&A handpiece. Stromal hydration of the main incision and paracentesis port was performed with BSS on a Fine canula. The wounds were tested for leak which was negative. The patient tolerated the  procedure well. There were no operative complications. The patient was then transferred to the recovery room in stable condition.  Complications: None  Specimen: None  EBL: None  Prosthetic device: Hoya Model 250, power 18.0, SN [id number].

## 2016-02-26 NOTE — Anesthesia Preprocedure Evaluation (Signed)
Anesthesia Evaluation  Patient identified by MRN, date of birth, ID band Patient awake    Reviewed: Allergy & Precautions, NPO status , Patient's Chart, lab work & pertinent test results  Airway Mallampati: I  TM Distance: >3 FB     Dental  (+) Poor Dentition, Missing, Chipped   Pulmonary Current Smoker,    breath sounds clear to auscultation       Cardiovascular hypertension, Pt. on medications  Rhythm:Regular Rate:Normal     Neuro/Psych    GI/Hepatic   Endo/Other  diabetes, Poorly Controlled, Type 2, Oral Hypoglycemic Agents  Renal/GU      Musculoskeletal   Abdominal   Peds  Hematology   Anesthesia Other Findings   Reproductive/Obstetrics                             Anesthesia Physical Anesthesia Plan  ASA: III  Anesthesia Plan: MAC   Post-op Pain Management:    Induction: Intravenous  Airway Management Planned: Nasal Cannula  Additional Equipment:   Intra-op Plan:   Post-operative Plan:   Informed Consent: I have reviewed the patients History and Physical, chart, labs and discussed the procedure including the risks, benefits and alternatives for the proposed anesthesia with the patient or authorized representative who has indicated his/her understanding and acceptance.     Plan Discussed with:   Anesthesia Plan Comments:         Anesthesia Quick Evaluation

## 2016-02-26 NOTE — Transfer of Care (Signed)
Immediate Anesthesia Transfer of Care Note  Patient: Natalie Contreras  Procedure(s) Performed: Procedure(s): CATARACT EXTRACTION PHACO AND INTRAOCULAR LENS PLACEMENT RIGHT; CDE:  16.90 (Right)  Patient Location: PACU  Anesthesia Type:MAC  Level of Consciousness: awake  Airway & Oxygen Therapy: Patient Spontanous Breathing  Post-op Assessment: Report given to RN  Post vital signs: Reviewed  Last Vitals:  Filed Vitals:   02/26/16 1005 02/26/16 1010  BP: 149/69 135/67  Resp: 16 16    Last Pain: There were no vitals filed for this visit.       Complications: No apparent anesthesia complications

## 2016-02-27 ENCOUNTER — Encounter (HOSPITAL_COMMUNITY): Payer: Self-pay | Admitting: Ophthalmology

## 2016-07-16 IMAGING — US US CAROTID DUPLEX BILAT
1 series · 13 of 24 positions shown · non-contrast
Comparison: None.

CLINICAL DATA: Bi bilateral carotid bruits. Hypertension,
hyperlipidemia, diabetes, previous tobacco abuse.

EXAM:
BILATERAL CAROTID DUPLEX ULTRASOUND
TECHNIQUE: Gray scale imaging, color Doppler and duplex ultrasound was
performed of bilateral carotid and vertebral arteries in the neck.
TECHNIQUE: Quantification of carotid stenosis is based on velocity parameters
that correlate the residual internal carotid diameter with
NASCET-based stenosis levels, using the diameter of the distal
internal carotid lumen as the denominator for stenosis measurement.

[Series 1: us carotid duplex bilat · 0.06mm/px · 13 of 68 slices shown]
[im 1/68]
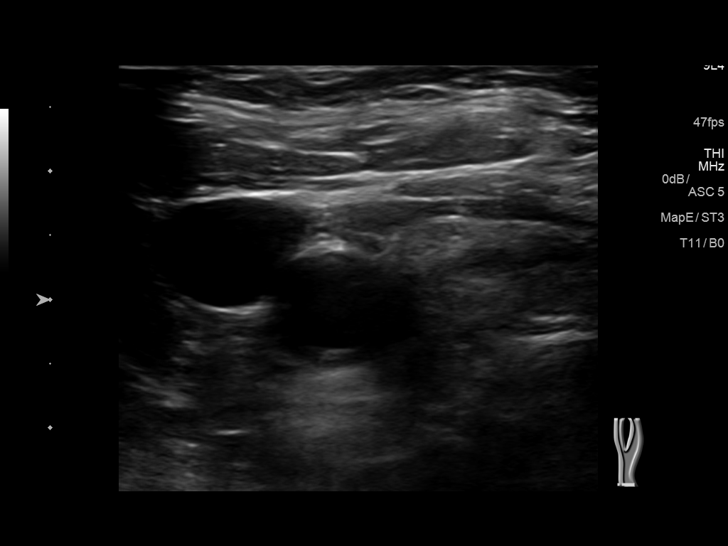
[im 6/68]
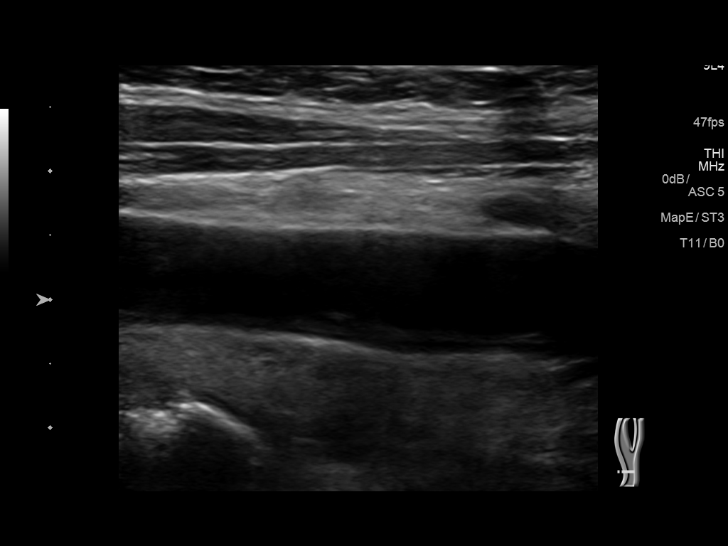
[im 12/68]
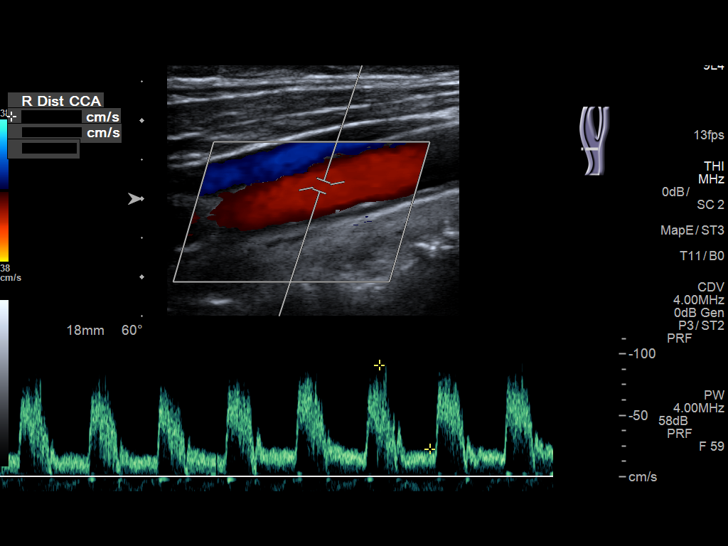
[im 18/68]
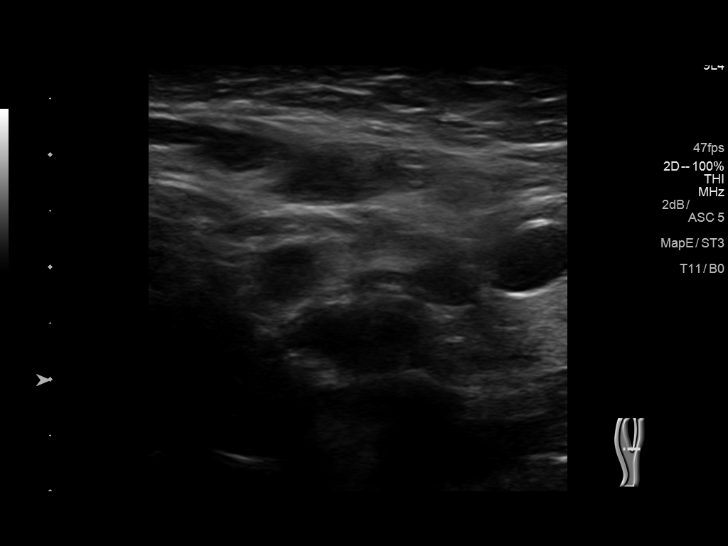
[im 24/68]
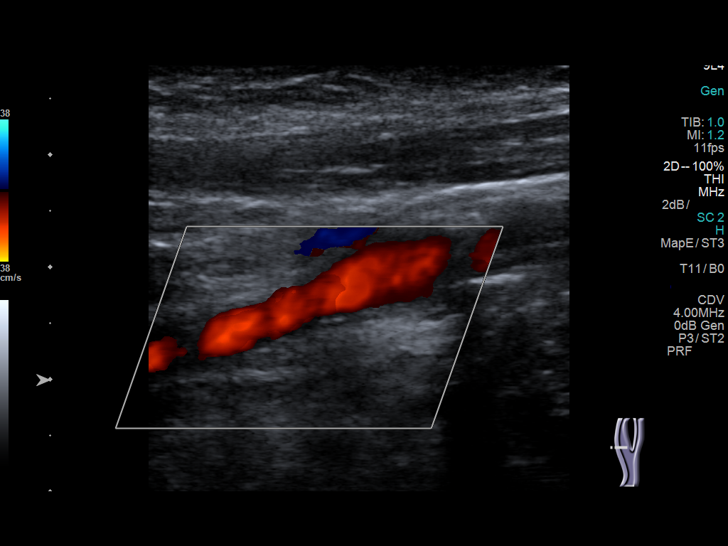
[im 30/68]
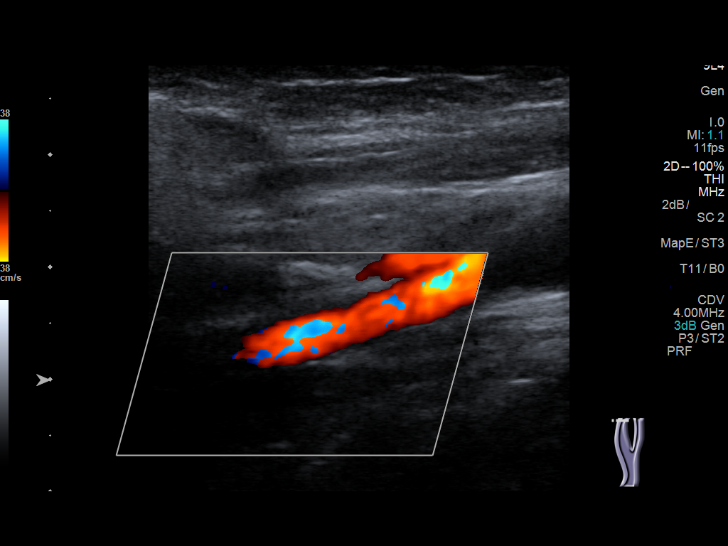
[im 35/68]
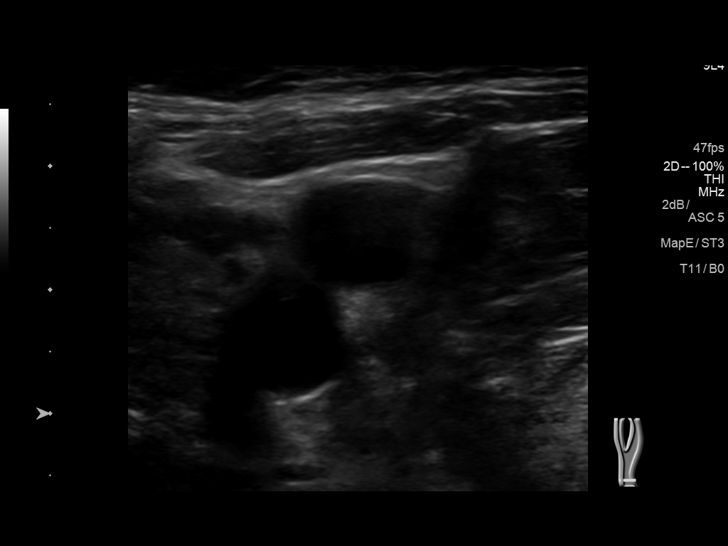
[im 38/68]
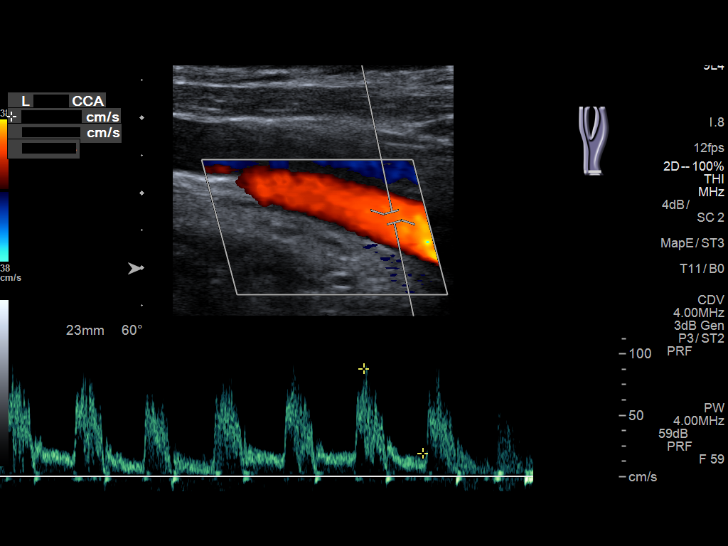
[im 44/68]
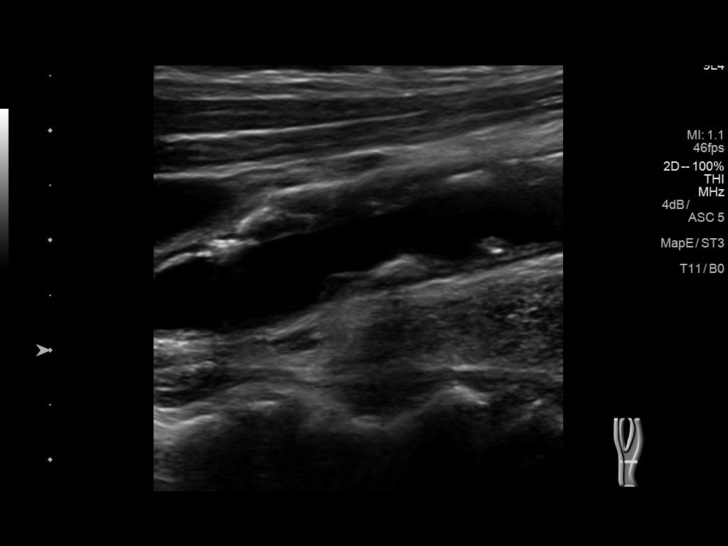
[im 50/68]
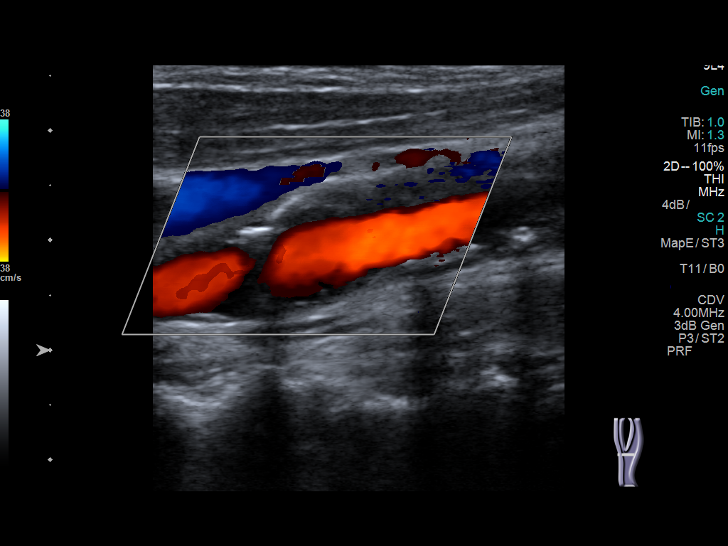
[im 56/68]
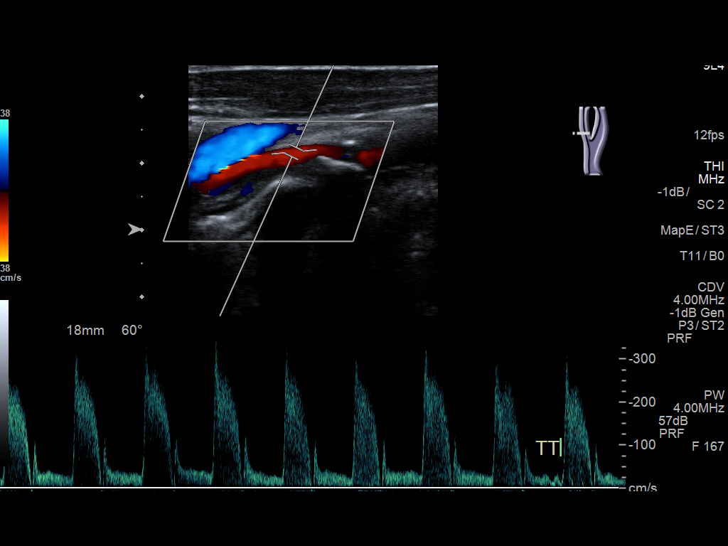
[im 62/68]
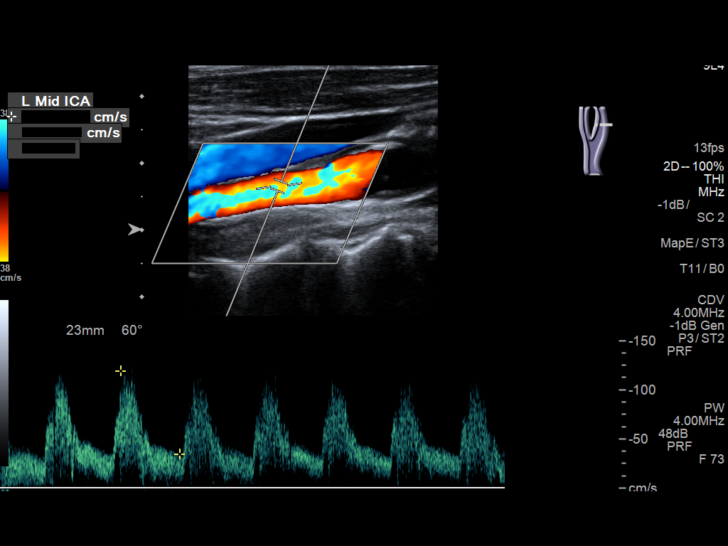
[im 68/68]
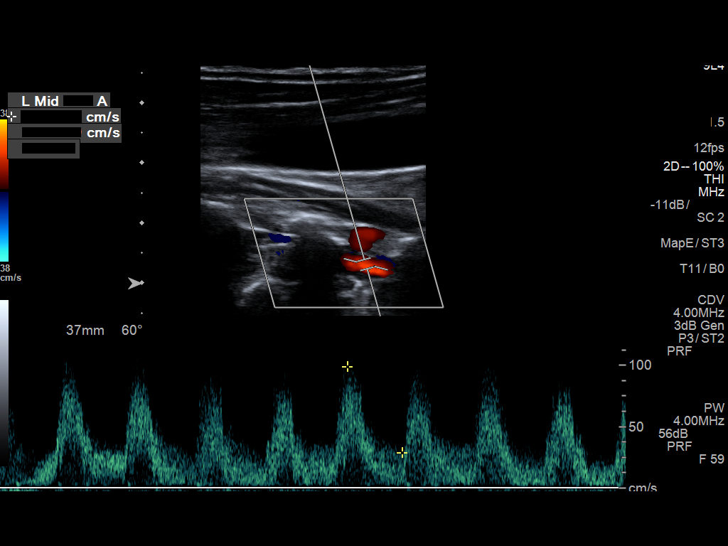

[13 of 24 positions shown; findings below may reference images not displayed]

The following velocity measurements were obtained:

PEAK SYSTOLIC/END DIASTOLIC

RIGHT

ICA:                     153/51cm/sec

CCA:                     103/19cm/sec

SYSTOLIC ICA/CCA RATIO:

DIASTOLIC ICA/CCA RATIO:

ECA:                     201cm/sec

LEFT

ICA:                     136/48cm/sec

CCA:                     76/19cm/sec

SYSTOLIC ICA/CCA RATIO:

DIASTOLIC ICA/CCA RATIO:

ECA:                     320cm/sec
FINDINGS: RIGHT CAROTID ARTERY: Smooth plaque through the common carotid
artery. Partially calcified eccentric plaque in the carotid bulb
extending across the bifurcation resulting in at least mild
stenosis. There arm elevated peak systolic velocities in the
proximal external carotid artery. Normal color Doppler signal and
waveforms through the visualized ICA.

RIGHT VERTEBRAL ARTERY:  Normal flow direction and waveform.

LEFT CAROTID ARTERY: Partially calcified plaque without stenosis in
the proximal and mid common carotid artery. Eccentric partially
calcified somewhat irregular plaque in the carotid bulb extending
into proximal internal and external carotid arteries. Focal color
aliasing in the proximal left external carotid artery with elevated
peak systolic velocities. Normal waveforms and color Doppler signal
through the ICA.

LEFT VERTEBRAL ARTERY: Normal flow direction and waveform.
IMPRESSION: 1. Bilateral carotid bifurcation and proximal ICA plaque resulting
in 50-69% diameter bilateral ICA stenosis.
2. Origin stenoses of the external carotid arteries, left worse than
right, may account for the bruits on physical exam.
3.  Antegrade bilateral vertebral arterial flow.

## 2016-10-07 ENCOUNTER — Other Ambulatory Visit (HOSPITAL_COMMUNITY): Payer: Self-pay | Admitting: Internal Medicine

## 2016-10-07 DIAGNOSIS — A269 Erysipeloid, unspecified: Secondary | ICD-10-CM

## 2016-10-07 DIAGNOSIS — R600 Localized edema: Secondary | ICD-10-CM

## 2016-10-07 DIAGNOSIS — M79606 Pain in leg, unspecified: Secondary | ICD-10-CM

## 2016-10-28 ENCOUNTER — Inpatient Hospital Stay (HOSPITAL_COMMUNITY): Payer: BLUE CROSS/BLUE SHIELD

## 2016-10-28 ENCOUNTER — Inpatient Hospital Stay (HOSPITAL_COMMUNITY)
Admission: EM | Admit: 2016-10-28 | Discharge: 2016-11-01 | DRG: 291 | Disposition: A | Discharge status: Home / Self Care | Payer: BLUE CROSS/BLUE SHIELD | Attending: Family Medicine | Admitting: Family Medicine

## 2016-10-28 ENCOUNTER — Encounter (HOSPITAL_COMMUNITY): Payer: Self-pay | Admitting: Emergency Medicine

## 2016-10-28 ENCOUNTER — Emergency Department (HOSPITAL_COMMUNITY): Payer: BLUE CROSS/BLUE SHIELD

## 2016-10-28 DIAGNOSIS — R0602 Shortness of breath: Secondary | ICD-10-CM | POA: Diagnosis present

## 2016-10-28 DIAGNOSIS — I272 Pulmonary hypertension, unspecified: Secondary | ICD-10-CM | POA: Diagnosis present

## 2016-10-28 DIAGNOSIS — E119 Type 2 diabetes mellitus without complications: Secondary | ICD-10-CM | POA: Diagnosis present

## 2016-10-28 DIAGNOSIS — I5031 Acute diastolic (congestive) heart failure: Secondary | ICD-10-CM | POA: Diagnosis present

## 2016-10-28 DIAGNOSIS — F17200 Nicotine dependence, unspecified, uncomplicated: Secondary | ICD-10-CM | POA: Diagnosis not present

## 2016-10-28 DIAGNOSIS — Z7982 Long term (current) use of aspirin: Secondary | ICD-10-CM

## 2016-10-28 DIAGNOSIS — Z9049 Acquired absence of other specified parts of digestive tract: Secondary | ICD-10-CM | POA: Diagnosis not present

## 2016-10-28 DIAGNOSIS — I110 Hypertensive heart disease with heart failure: Secondary | ICD-10-CM | POA: Diagnosis present

## 2016-10-28 DIAGNOSIS — Z9841 Cataract extraction status, right eye: Secondary | ICD-10-CM

## 2016-10-28 DIAGNOSIS — Z961 Presence of intraocular lens: Secondary | ICD-10-CM | POA: Diagnosis present

## 2016-10-28 DIAGNOSIS — Z7984 Long term (current) use of oral hypoglycemic drugs: Secondary | ICD-10-CM | POA: Diagnosis not present

## 2016-10-28 DIAGNOSIS — E785 Hyperlipidemia, unspecified: Secondary | ICD-10-CM

## 2016-10-28 DIAGNOSIS — R600 Localized edema: Secondary | ICD-10-CM

## 2016-10-28 DIAGNOSIS — J9601 Acute respiratory failure with hypoxia: Secondary | ICD-10-CM

## 2016-10-28 DIAGNOSIS — L089 Local infection of the skin and subcutaneous tissue, unspecified: Secondary | ICD-10-CM

## 2016-10-28 DIAGNOSIS — I10 Essential (primary) hypertension: Secondary | ICD-10-CM | POA: Diagnosis not present

## 2016-10-28 DIAGNOSIS — I340 Nonrheumatic mitral (valve) insufficiency: Secondary | ICD-10-CM | POA: Diagnosis present

## 2016-10-28 DIAGNOSIS — F17210 Nicotine dependence, cigarettes, uncomplicated: Secondary | ICD-10-CM | POA: Diagnosis present

## 2016-10-28 DIAGNOSIS — I509 Heart failure, unspecified: Secondary | ICD-10-CM

## 2016-10-28 DIAGNOSIS — E1140 Type 2 diabetes mellitus with diabetic neuropathy, unspecified: Secondary | ICD-10-CM

## 2016-10-28 DIAGNOSIS — Z9842 Cataract extraction status, left eye: Secondary | ICD-10-CM

## 2016-10-28 DIAGNOSIS — R0600 Dyspnea, unspecified: Secondary | ICD-10-CM

## 2016-10-28 DIAGNOSIS — E11628 Type 2 diabetes mellitus with other skin complications: Secondary | ICD-10-CM

## 2016-10-28 HISTORY — DX: Acute diastolic (congestive) heart failure: I50.31

## 2016-10-28 LAB — CBC WITH DIFFERENTIAL/PLATELET
BASOS ABS: 0 10*3/uL (ref 0.0–0.1)
Basophils Relative: 0 %
Eosinophils Absolute: 0.1 10*3/uL (ref 0.0–0.7)
Eosinophils Relative: 2 %
HEMATOCRIT: 43 % (ref 36.0–46.0)
Hemoglobin: 14.2 g/dL (ref 12.0–15.0)
LYMPHS PCT: 33 %
Lymphs Abs: 2.3 10*3/uL (ref 0.7–4.0)
MCH: 28.5 pg (ref 26.0–34.0)
MCHC: 33 g/dL (ref 30.0–36.0)
MCV: 86.2 fL (ref 78.0–100.0)
Monocytes Absolute: 0.4 10*3/uL (ref 0.1–1.0)
Monocytes Relative: 6 %
NEUTROS ABS: 4 10*3/uL (ref 1.7–7.7)
NEUTROS PCT: 59 %
Platelets: 275 10*3/uL (ref 150–400)
RBC: 4.99 MIL/uL (ref 3.87–5.11)
RDW: 14.7 % (ref 11.5–15.5)
WBC: 6.9 10*3/uL (ref 4.0–10.5)

## 2016-10-28 LAB — BRAIN NATRIURETIC PEPTIDE: B NATRIURETIC PEPTIDE 5: 220 pg/mL — AB (ref 0.0–100.0)

## 2016-10-28 LAB — COMPREHENSIVE METABOLIC PANEL
ALBUMIN: 3 g/dL — AB (ref 3.5–5.0)
ALT: 22 U/L (ref 14–54)
ANION GAP: 9 (ref 5–15)
AST: 16 U/L (ref 15–41)
Alkaline Phosphatase: 141 U/L — ABNORMAL HIGH (ref 38–126)
BILIRUBIN TOTAL: 0.4 mg/dL (ref 0.3–1.2)
BUN: 23 mg/dL — ABNORMAL HIGH (ref 6–20)
CO2: 29 mmol/L (ref 22–32)
Calcium: 9.1 mg/dL (ref 8.9–10.3)
Chloride: 101 mmol/L (ref 101–111)
Creatinine, Ser: 0.65 mg/dL (ref 0.44–1.00)
GFR calc Af Amer: >60 mL/min (ref >60)
Glucose, Bld: 205 mg/dL — ABNORMAL HIGH (ref 65–99)
POTASSIUM: 3.5 mmol/L (ref 3.5–5.1)
Sodium: 139 mmol/L (ref 135–145)
TOTAL PROTEIN: 6.7 g/dL (ref 6.5–8.1)

## 2016-10-28 LAB — GLUCOSE, CAPILLARY
GLUCOSE-CAPILLARY: 242 mg/dL — AB (ref 65–99)
GLUCOSE-CAPILLARY: 142 mg/dL — AB (ref 65–99)

## 2016-10-28 LAB — TROPONIN I
Troponin I: <0.03 ng/mL (ref <0.03)
Troponin I: <0.03 ng/mL (ref <0.03)

## 2016-10-28 LAB — TSH: TSH: 1.589 u[IU]/mL (ref 0.350–4.500)

## 2016-10-28 IMAGING — DX DG CHEST 2V
2 series · 2 of 2 positions shown · non-contrast
Comparison: None.

CLINICAL DATA: Shortness of breath with lower extremity edema.
Hypertension.

EXAM:
CHEST  2 VIEW

[chest pa]
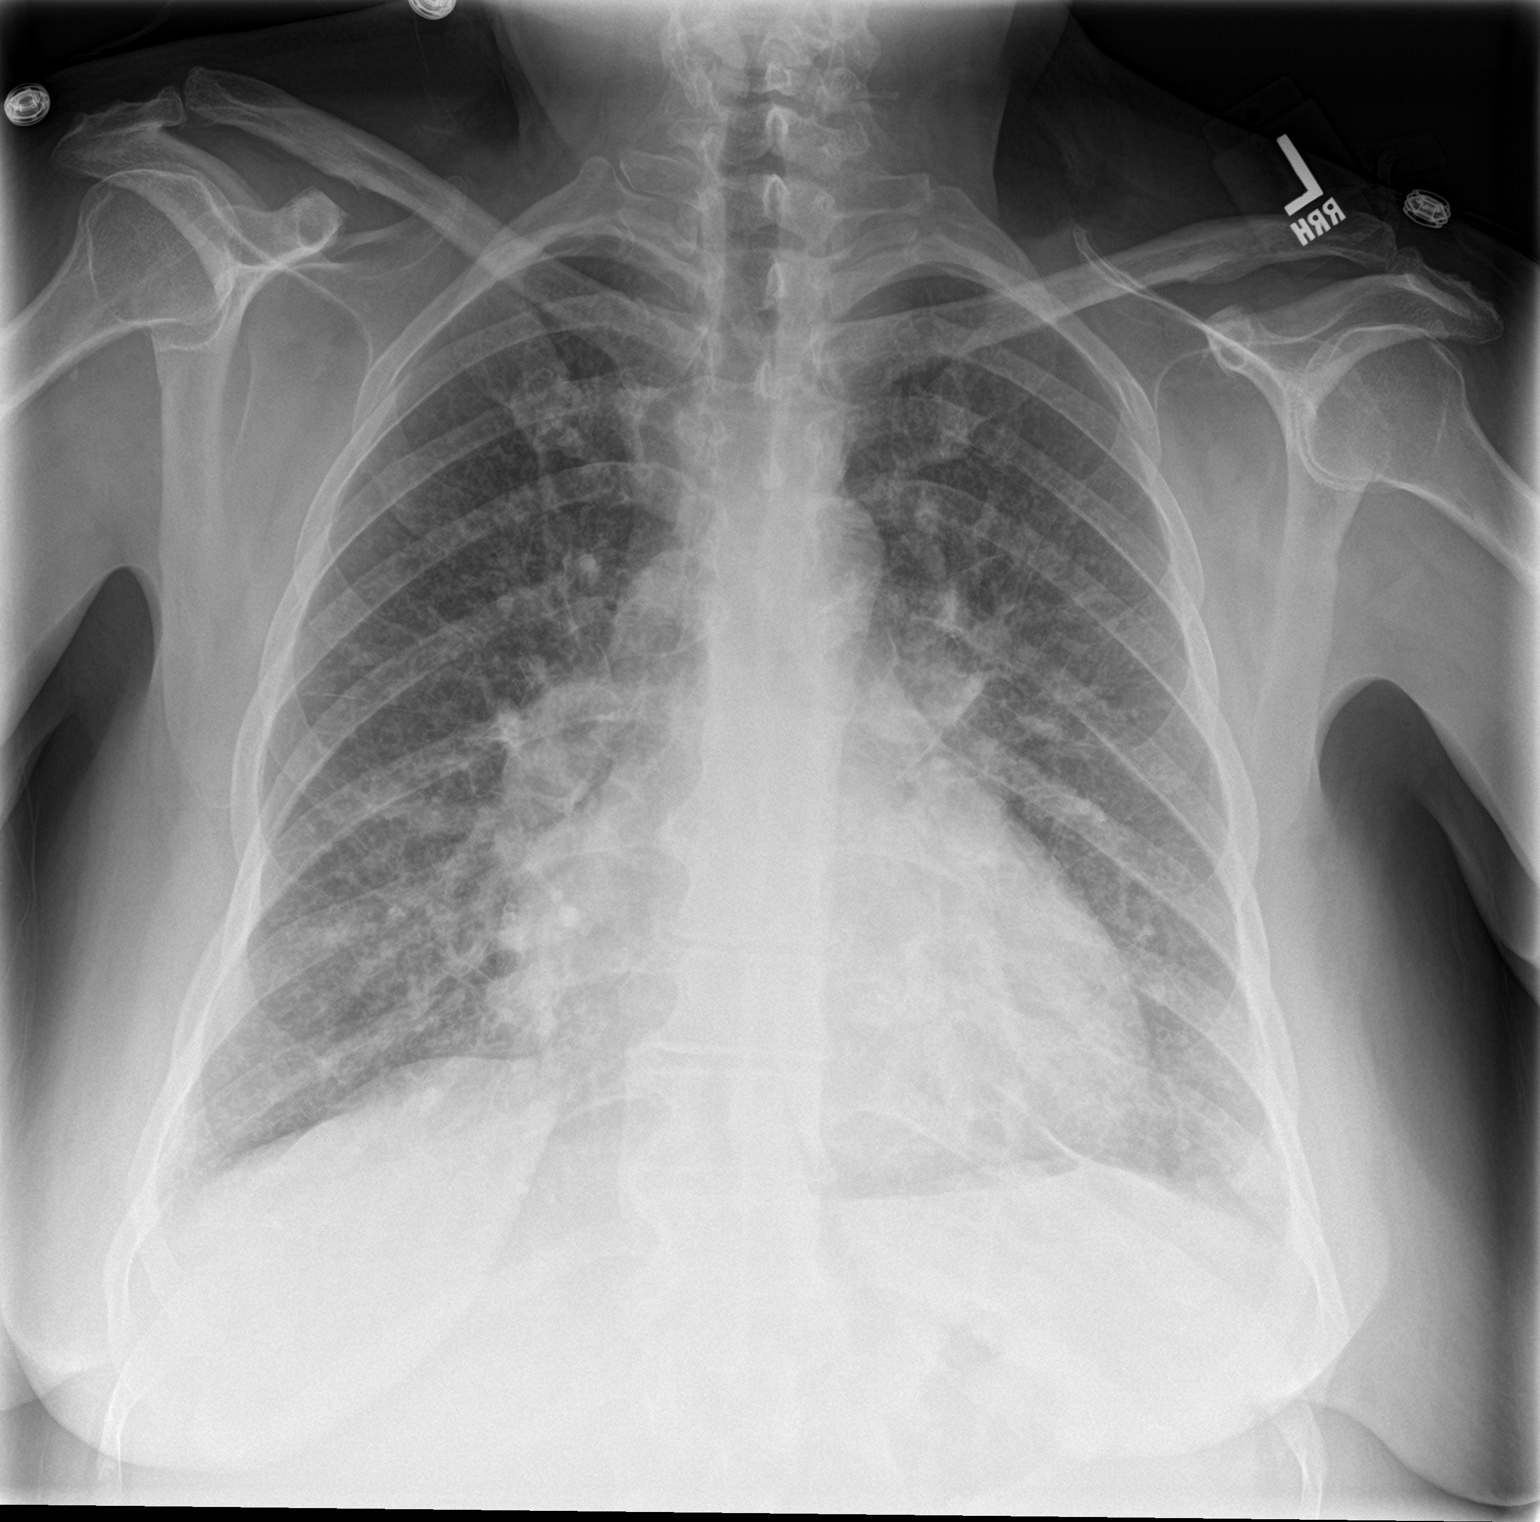

[chest lat]
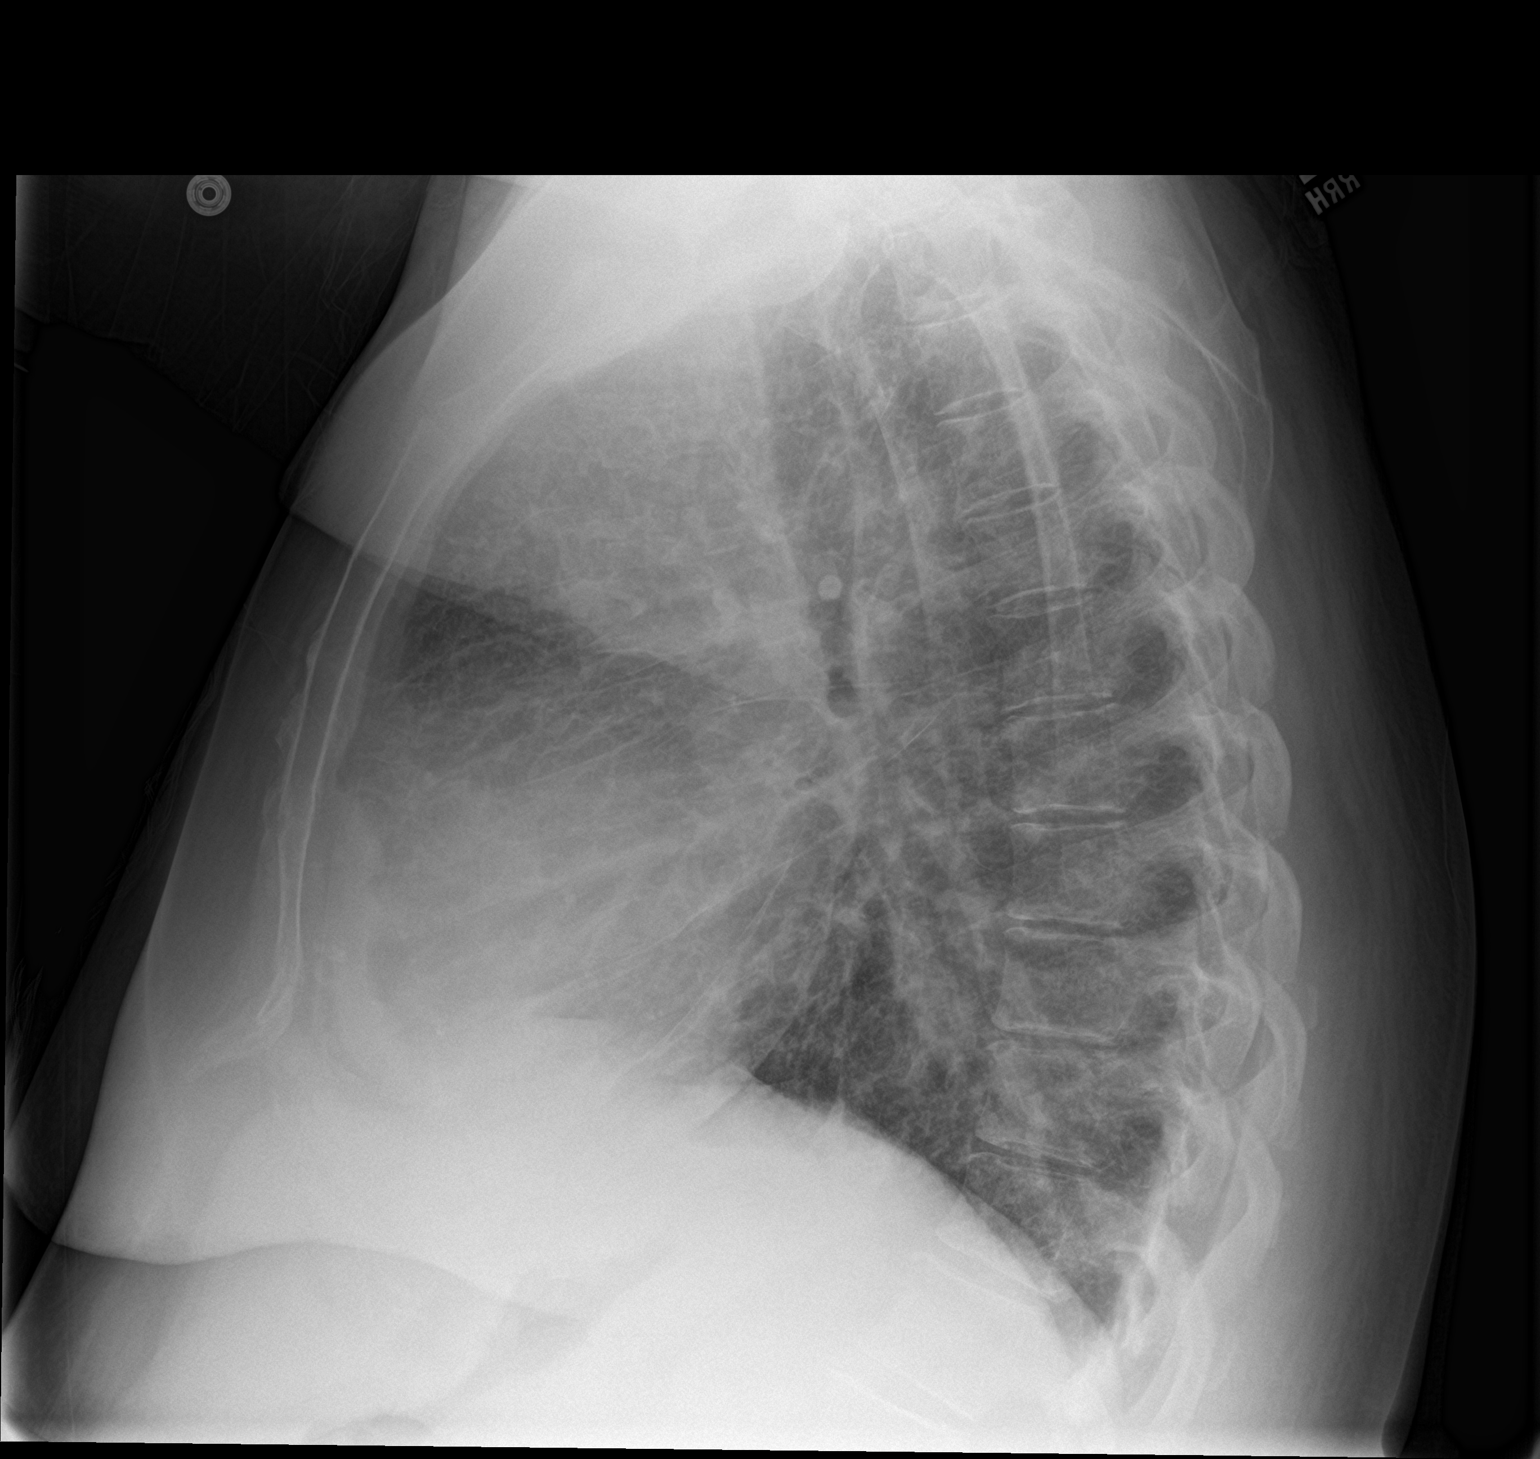

[2 of 2 positions shown; findings below may reference images not displayed]

FINDINGS: There is interstitial pulmonary edema bilaterally. There is no
airspace consolidation. There is cardiomegaly with pulmonary venous
hypertension. There is atherosclerotic calcification in the aorta.
No bone lesions. No pneumothorax.
IMPRESSION: Findings indicative of a degree of congestive heart failure. No
airspace consolidation.

## 2016-10-28 MED ORDER — ONDANSETRON HCL 4 MG/2ML IJ SOLN: 4.0000 mg | Freq: Four times a day (QID) | INTRAMUSCULAR | Status: DC | PRN

## 2016-10-28 MED ORDER — PRAVASTATIN SODIUM 40 MG PO TABS
40.0000 mg | ORAL_TABLET | Freq: Every day | ORAL | Status: DC
  Administered 2016-10-28 – 2016-10-31 (×4): 40 mg via ORAL
  Filled 2016-10-28 (×4): qty 1

## 2016-10-28 MED ORDER — NITROGLYCERIN 0.4 MG SL SUBL
0.4000 mg | SUBLINGUAL_TABLET | Freq: Once | SUBLINGUAL | Status: AC
  Administered 2016-10-28: 0.4 mg via SUBLINGUAL
  Filled 2016-10-28: qty 1

## 2016-10-28 MED ORDER — SODIUM CHLORIDE 0.9% FLUSH: 3.0000 mL | INTRAVENOUS | Status: DC | PRN

## 2016-10-28 MED ORDER — ASPIRIN EC 81 MG PO TBEC
81.0000 mg | DELAYED_RELEASE_TABLET | Freq: Every day | ORAL | Status: DC
  Administered 2016-10-29 – 2016-11-01 (×4): 81 mg via ORAL
  Filled 2016-10-28 (×4): qty 1

## 2016-10-28 MED ORDER — INSULIN ASPART 100 UNIT/ML ~~LOC~~ SOLN: 0.0000 [IU] | Freq: Every day | SUBCUTANEOUS | Status: DC

## 2016-10-28 MED ORDER — SODIUM CHLORIDE 0.9% FLUSH
3.0000 mL | Freq: Two times a day (BID) | INTRAVENOUS | Status: DC
  Administered 2016-10-28 – 2016-11-01 (×9): 3 mL via INTRAVENOUS

## 2016-10-28 MED ORDER — SODIUM CHLORIDE 0.9 % IV SOLN: 250.0000 mL | INTRAVENOUS | Status: DC | PRN

## 2016-10-28 MED ORDER — LISINOPRIL 10 MG PO TABS
30.0000 mg | ORAL_TABLET | Freq: Every day | ORAL | Status: DC
Start: 2016-10-29 — End: 2016-11-01
  Administered 2016-10-29 – 2016-11-01 (×4): 30 mg via ORAL
  Filled 2016-10-28 (×4): qty 3

## 2016-10-28 MED ORDER — ENOXAPARIN SODIUM 60 MG/0.6ML ~~LOC~~ SOLN
50.0000 mg | SUBCUTANEOUS | Status: DC
  Administered 2016-10-28 – 2016-10-31 (×4): 50 mg via SUBCUTANEOUS
  Filled 2016-10-28 (×4): qty 0.6

## 2016-10-28 MED ORDER — NITROGLYCERIN 2 % TD OINT
TOPICAL_OINTMENT | TRANSDERMAL | Status: AC
  Filled 2016-10-28: qty 1

## 2016-10-28 MED ORDER — FUROSEMIDE 10 MG/ML IJ SOLN
40.0000 mg | Freq: Two times a day (BID) | INTRAMUSCULAR | Status: DC
  Administered 2016-10-28 – 2016-10-29 (×3): 40 mg via INTRAVENOUS
  Filled 2016-10-28 (×3): qty 4

## 2016-10-28 MED ORDER — ACETAMINOPHEN 325 MG PO TABS
650.0000 mg | ORAL_TABLET | Freq: Four times a day (QID) | ORAL | Status: DC | PRN
  Administered 2016-10-28: 650 mg via ORAL
  Filled 2016-10-28: qty 2

## 2016-10-28 MED ORDER — INSULIN ASPART 100 UNIT/ML ~~LOC~~ SOLN
0.0000 [IU] | Freq: Three times a day (TID) | SUBCUTANEOUS | Status: DC
  Administered 2016-10-28: 5 [IU] via SUBCUTANEOUS
  Administered 2016-10-29: 2 [IU] via SUBCUTANEOUS
  Administered 2016-10-29: 3 [IU] via SUBCUTANEOUS
  Administered 2016-10-29: 5 [IU] via SUBCUTANEOUS
  Administered 2016-10-30: 3 [IU] via SUBCUTANEOUS
  Administered 2016-10-30: 2 [IU] via SUBCUTANEOUS
  Administered 2016-10-30 – 2016-10-31 (×2): 3 [IU] via SUBCUTANEOUS
  Administered 2016-10-31 (×2): 2 [IU] via SUBCUTANEOUS
  Administered 2016-11-01: 3 [IU] via SUBCUTANEOUS

## 2016-10-28 MED ORDER — FUROSEMIDE 10 MG/ML IJ SOLN
40.0000 mg | Freq: Once | INTRAMUSCULAR | Status: AC
  Administered 2016-10-28: 40 mg via INTRAVENOUS
  Filled 2016-10-28: qty 4

## 2016-10-28 MED ORDER — CARVEDILOL 3.125 MG PO TABS
3.1250 mg | ORAL_TABLET | Freq: Two times a day (BID) | ORAL | Status: DC
  Administered 2016-10-28 – 2016-11-01 (×8): 3.125 mg via ORAL
  Filled 2016-10-28 (×8): qty 1

## 2016-10-28 MED ORDER — ACETAMINOPHEN 325 MG PO TABS: 650.0000 mg | ORAL_TABLET | ORAL | Status: DC | PRN

## 2016-10-28 NOTE — ED Provider Notes (Signed)
[facility] DEPT Provider Note   CSN: [id number] Arrival date & time: 10/28/16  0715   By signing my name below, I, Hilbert Odor, attest that this documentation has been prepared under the direction and in the presence of Elnora Morrison, MD. Electronically Signed: Hilbert Odor, Scribe. 10/28/16. 9:12 AM. History   Chief Complaint Chief Complaint  Patient presents with  . Leg Swelling  . Shortness of Breath     HPI HPI Comments: Natalie Contreras is a 51 y.o. female who presents to the Emergency Department complaining of Worsening leg edema bilateral for the past 2 months. Patient developed new onset shortness of breath this morning with lying flat. No history of cardiac issues including no history of heart attack known or heart failure. Unsure weight gain. No fevers chills or cough. No chest pain but persistent shortness of breath. No home oxygen.   Past Medical History:  Diagnosis Date  . Diabetes mellitus without complication ([place])   . Hypertension     Patient Active Problem List   Diagnosis Date Noted  . CHF (congestive heart failure) ([place]) 10/28/2016    Past Surgical History:  Procedure Laterality Date  . CATARACT EXTRACTION W/PHACO Left 02/08/2016   Procedure: CATARACT EXTRACTION PHACO AND INTRAOCULAR LENS PLACEMENT (IOC);  Surgeon: Tonny Branch, MD;  Location: [facility];  Service: Ophthalmology;  Laterality: Left;  CDE 11.43   . CATARACT EXTRACTION W/PHACO Right 02/26/2016   Procedure: CATARACT EXTRACTION PHACO AND INTRAOCULAR LENS PLACEMENT RIGHT; CDE:  16.90;  Surgeon: Tonny Branch, MD;  Location: [facility];  Service: Ophthalmology;  Laterality: Right;  . CHOLECYSTECTOMY      OB History    No data available       Home Medications    Prior to Admission medications   Medication Sig Start Date End Date Taking? Authorizing Provider  aspirin EC 81 MG tablet Take 81 mg by mouth daily.    Historical Provider, MD  lisinopril-hydrochlorothiazide (PRINZIDE,ZESTORETIC)  10-12.5 MG tablet Take 1 tablet by mouth daily.    Historical Provider, MD  metFORMIN (GLUCOPHAGE) 500 MG tablet Take 1 tablet by mouth daily. 01/10/16   Historical Provider, MD    Family History History reviewed. No pertinent family history.  Social History Social History  Substance Use Topics  . Smoking status: Current Every Day Smoker    Packs/day: 1.00    Years: 25.00    Types: Cigarettes  . Smokeless tobacco: Not on file  . Alcohol use No     Allergies   Patient has no known allergies.   Review of Systems Review of Systems  Constitutional: Negative for chills and fever.  HENT: Negative for congestion.   Eyes: Negative for visual disturbance.  Respiratory: Positive for shortness of breath.   Cardiovascular: Positive for leg swelling. Negative for chest pain.  Gastrointestinal: Negative for abdominal pain and vomiting.  Genitourinary: Negative for dysuria and flank pain.  Musculoskeletal: Negative for back pain, neck pain and neck stiffness.  Skin: Negative for rash.  Neurological: Negative for light-headedness and headaches.     Physical Exam Updated Vital Signs BP 152/65   Pulse 66   Temp 97.8 F (36.6 C) (Oral)   Resp 18   Ht 5\' 4"  (1.626 m)   Wt 220 lb (99.8 kg)   SpO2 93%   BMI 37.76 kg/m   Physical Exam  Constitutional: She appears well-developed and well-nourished. No distress.  HENT:  Head: Normocephalic and atraumatic.  Eyes: Conjunctivae are normal.  Neck: Neck supple.  Cardiovascular: Normal rate and regular rhythm.   No murmur heard. Pulmonary/Chest: No respiratory distress. She has rales (crackles at bases, mild increase eff).  Abdominal: Soft. There is no tenderness.  Musculoskeletal: She exhibits edema.  Neurological: She is alert.  Skin: Skin is warm and dry. There is erythema.  Patient has 3+ pitting edema bilateral lower extremities from mid thigh down to ankles and feet. Patient has mild erythema anterior bilateral.  Psychiatric:  She has a normal mood and affect.  Nursing note and vitals reviewed.    ED Treatments / Results  DIAGNOSTIC STUDIES: Oxygen Saturation is 93% on RA, normal by my interpretation.    COORDINATION OF CARE: 9:12 AM Discussed treatment plan with pt at bedside and pt agreed to plan.  Labs (all labs ordered are listed, but only abnormal results are displayed) Labs Reviewed  COMPREHENSIVE METABOLIC PANEL - Abnormal; Notable for the following:       Result Value   Glucose, Bld 205 (*)    BUN 23 (*)    Albumin 3.0 (*)    Alkaline Phosphatase 141 (*)    All other components within normal limits  BRAIN NATRIURETIC PEPTIDE - Abnormal; Notable for the following:    B Natriuretic Peptide 220.0 (*)    All other components within normal limits  CBC WITH DIFFERENTIAL/PLATELET  TROPONIN I    EKG  EKG Interpretation  Date/Time:  Monday October 28 2016 08:20:36 EST Ventricular Rate:  73 PR Interval:    QRS Duration: 92 QT Interval:  394 QTC Calculation: 435 R Axis:   -52 Text Interpretation:  Sinus rhythm Left axis deviation Anterior infarct, old Confirmed by Quinley Nesler MD, Senan Urey [phone]) on 10/28/2016 9:09:33 AM       Radiology Dg Chest 2 View  Result Date: 10/28/2016 CLINICAL DATA:  Shortness of breath with lower extremity edema. Hypertension. EXAM: CHEST  2 VIEW COMPARISON:  None. FINDINGS: There is interstitial pulmonary edema bilaterally. There is no airspace consolidation. There is cardiomegaly with pulmonary venous hypertension. There is atherosclerotic calcification in the aorta. No bone lesions. No pneumothorax. IMPRESSION: Findings indicative of a degree of congestive heart failure. No airspace consolidation. Electronically Signed   By: Lowella Grip III M.D.   On: 10/28/2016 08:15    Procedures Procedures (including critical care time)  Medications Ordered in ED Medications  furosemide (LASIX) injection 40 mg (not administered)  nitroGLYCERIN (NITROSTAT) SL tablet 0.4 mg  (0.4 mg Sublingual Given 10/28/16 0820)     Initial Impression / Assessment and Plan / ED Course  I have reviewed the triage vital signs and the nursing notes.  Pertinent labs & imaging results that were available during my care of the patient were reviewed by me and considered in my medical decision making (see chart for details).   patient presents with clinically CHF with leg edema shortness of breath, abnormal x-ray and requiring 1 L nasal cannula. Discussed plan with triad hospitalist for admission/observation for further workup further IV Lasix, formal echo and possible other testing.  The patients results and plan were reviewed and discussed.   Any x-rays performed were independently reviewed by myself.   Differential diagnosis were considered with the presenting HPI.  Medications  furosemide (LASIX) injection 40 mg (not administered)  nitroGLYCERIN (NITROSTAT) SL tablet 0.4 mg (0.4 mg Sublingual Given 10/28/16 0820)    Vitals:   10/28/16 0827 10/28/16 0829 10/28/16 0830 10/28/16 0900  BP: 143/58  152/65   Pulse: 71 69 67 66  Resp: 20 17  17 18  Temp:      TempSrc:      SpO2: (!) 88% 92% 91% 93%  Weight:      Height:        Final diagnoses:  Bilateral lower extremity edema  Acute dyspnea    Admission/ observation were discussed with the admitting physician, patient and/or family and they are comfortable with the plan.    Final Clinical Impressions(s) / ED Diagnoses   Final diagnoses:  Bilateral lower extremity edema  Acute dyspnea    New Prescriptions New Prescriptions   No medications on file      Elnora Morrison, MD 10/28/16 [phone]

## 2016-10-28 NOTE — ED Notes (Signed)
Report given to Ander Purpura, RN for 3A admission.

## 2016-10-28 NOTE — ED Triage Notes (Signed)
Pt reports swelling in her feet and legs with redness since before Christmas.  Progressive shortness of breath.

## 2016-10-28 NOTE — H&P (Signed)
History and Physical    Natalie Contreras [id number] DOB: 08-Mar-1966 DOA: 10/28/2016  PCP: [name], MD  Patient coming from: home  Chief Complaint: home  HPI: Natalie Contreras is a 51 y.o. female with medical history significant of hypertension, diabetes, hyperlipidemia who presents to the hospital with complaints of shortness of breath. Patient reports lower extremity edema which she had noticed for the past 2 months. She was seen her primary care physician and was prescribed Lasix which did not have significant benefit. She has not had any chest pain. Her lower extremity edema has progressively gotten worse. She has redness in her lower extremities bilaterally. She feels that her weight has gone up at least 20 pounds since onset of symptoms. This morning, she woke up from sleep when she was unable to breathe and had to sit up. She's not had any fever, cough, nausea, vomiting, dysuria.  ED Course: Chest x-ray indicated congestive heart failure, BNP was mildly elevated. Cardiac enzymes were negative and EKG did not show any acute findings.  Review of Systems: As per HPI otherwise 10 point review of systems negative.    Past Medical History:  Diagnosis Date  . Diabetes mellitus without complication ([place])   . Hypertension     Past Surgical History:  Procedure Laterality Date  . CATARACT EXTRACTION W/PHACO Left 02/08/2016   Procedure: CATARACT EXTRACTION PHACO AND INTRAOCULAR LENS PLACEMENT (IOC);  Surgeon: Tonny Branch, MD;  Location: [facility];  Service: Ophthalmology;  Laterality: Left;  CDE 11.43   . CATARACT EXTRACTION W/PHACO Right 02/26/2016   Procedure: CATARACT EXTRACTION PHACO AND INTRAOCULAR LENS PLACEMENT RIGHT; CDE:  16.90;  Surgeon: Tonny Branch, MD;  Location: [facility];  Service: Ophthalmology;  Laterality: Right;  . CHOLECYSTECTOMY       reports that she has been smoking Cigarettes.  She has a 25.00 pack-year smoking history. She uses smokeless tobacco. She reports that she does  not drink alcohol or use drugs.  No Known Allergies  Family history: no family history of heart disease that the patient is aware of  Prior to Admission medications   Medication Sig Start Date End Date Taking? Authorizing Provider  aspirin EC 81 MG tablet Take 81 mg by mouth daily.   Yes Historical Provider, MD  carvedilol (COREG) 3.125 MG tablet Take 1 tablet by mouth 2 (two) times daily. 10/08/16  Yes Historical Provider, MD  dicloxacillin (DYNAPEN) 500 MG capsule Take 1 capsule by mouth daily. 09/24/16  Yes Historical Provider, MD  furosemide (LASIX) 40 MG tablet Take 1 tablet by mouth daily. 10/18/16  Yes Historical Provider, MD  glipiZIDE (GLUCOTROL) 5 MG tablet Take 5 mg by mouth daily before breakfast.   Yes Historical Provider, MD  lisinopril (PRINIVIL,ZESTRIL) 30 MG tablet Take 1 tablet by mouth daily. 10/17/16  Yes Historical Provider, MD  lovastatin (MEVACOR) 40 MG tablet Take 1 tablet by mouth at bedtime.  07/29/16  Yes Historical Provider, MD  metFORMIN (GLUCOPHAGE) 1000 MG tablet Take 1,000 mg by mouth daily.  01/10/16  Yes Historical Provider, MD    Physical Exam: Vitals:   10/28/16 0830 10/28/16 0900 10/28/16 0930 10/28/16 1027  BP: 152/65  163/63 (!) 178/59  Pulse: 67 66 68 71  Resp: 17 18 19    Temp:   98 F (36.7 C)   TempSrc:   Oral   SpO2: 91% 93% 94% 99%  Weight:    101.5 kg (223 lb 11.2 oz)  Height:    5\' 4"  (1.626 m)  Constitutional: NAD, calm, comfortable Vitals:   10/28/16 0830 10/28/16 0900 10/28/16 0930 10/28/16 1027  BP: 152/65  163/63 (!) 178/59  Pulse: 67 66 68 71  Resp: 17 18 19    Temp:   98 F (36.7 C)   TempSrc:   Oral   SpO2: 91% 93% 94% 99%  Weight:    101.5 kg (223 lb 11.2 oz)  Height:    5\' 4"  (1.626 m)   Eyes: PERRL, lids and conjunctivae normal ENMT: Mucous membranes are moist. Posterior pharynx clear of any exudate or lesions. Poor dentition.  Neck: normal, supple, no masses, no thyromegaly Respiratory: clear to auscultation  bilaterally, no wheezing, no crackles. Normal respiratory effort. No accessory muscle use.  Cardiovascular: Regular rate and rhythm, no murmurs / rubs / gallops.2-3+ extremity edema. 2+ pedal pulses. No carotid bruits.  Abdomen: no tenderness, no masses palpated. No hepatosplenomegaly. Bowel sounds positive.  Musculoskeletal: no clubbing / cyanosis. No joint deformity upper and lower extremities. Good ROM, no contractures. Normal muscle tone.  Skin: erythema over lower legs bilaterally Neurologic: CN 2-12 grossly intact. Sensation intact, DTR normal. Strength 5/5 in all 4.  Psychiatric: Normal judgment and insight. Alert and oriented x 3. Normal mood.    Labs on Admission: I have personally reviewed following labs and imaging studies  CBC:  Recent Labs Lab 10/28/16 0739  WBC 6.9  NEUTROABS 4.0  HGB 14.2  HCT 43.0  MCV 86.2  PLT 123XX123   Basic Metabolic Panel:  Recent Labs Lab 10/28/16 0739  NA 139  K 3.5  CL 101  CO2 29  GLUCOSE 205*  BUN 23*  CREATININE 0.65  CALCIUM 9.1   GFR: Estimated Creatinine Clearance: 98.6 mL/min (by C-G formula based on SCr of 0.65 mg/dL). Liver Function Tests:  Recent Labs Lab 10/28/16 0739  AST 16  ALT 22  ALKPHOS 141*  BILITOT 0.4  PROT 6.7  ALBUMIN 3.0*   No results for input(s): LIPASE, AMYLASE in the last 168 hours. No results for input(s): AMMONIA in the last 168 hours. Coagulation Profile: No results for input(s): INR, PROTIME in the last 168 hours. Cardiac Enzymes:  Recent Labs Lab 10/28/16 0739  TROPONINI <0.03   BNP (last 3 results) No results for input(s): PROBNP in the last 8760 hours. HbA1C: No results for input(s): HGBA1C in the last 72 hours. CBG: No results for input(s): GLUCAP in the last 168 hours. Lipid Profile: No results for input(s): CHOL, HDL, LDLCALC, TRIG, CHOLHDL, LDLDIRECT in the last 72 hours. Thyroid Function Tests: No results for input(s): TSH, T4TOTAL, FREET4, T3FREE, THYROIDAB in the last  72 hours. Anemia Panel: No results for input(s): VITAMINB12, FOLATE, FERRITIN, TIBC, IRON, RETICCTPCT in the last 72 hours. Urine analysis: No results found for: COLORURINE, APPEARANCEUR, LABSPEC, PHURINE, GLUCOSEU, HGBUR, BILIRUBINUR, KETONESUR, PROTEINUR, UROBILINOGEN, NITRITE, LEUKOCYTESUR Sepsis Labs: !!!!!!!!!!!!!!!!!!!!!!!!!!!!!!!!!!!!!!!!!!!! @LABRCNTIP (procalcitonin:4,lacticidven:4) )No results found for this or any previous visit (from the past 240 hour(s)).   Radiological Exams on Admission: Dg Chest 2 View  Result Date: 10/28/2016 CLINICAL DATA:  Shortness of breath with lower extremity edema. Hypertension. EXAM: CHEST  2 VIEW COMPARISON:  None. FINDINGS: There is interstitial pulmonary edema bilaterally. There is no airspace consolidation. There is cardiomegaly with pulmonary venous hypertension. There is atherosclerotic calcification in the aorta. No bone lesions. No pneumothorax. IMPRESSION: Findings indicative of a degree of congestive heart failure. No airspace consolidation. Electronically Signed   By: Lowella Grip III M.D.   On: 10/28/2016 08:15    EKG: Independently reviewed.  Sinus rhythm without acute changes  Assessment/Plan Active Problems:   CHF (congestive heart failure) ([facility])   Benign essential HTN   Hyperlipidemia   Diabetes mellitus type 2, noninsulin dependent ([place])    1. Acute congestive heart failure. Suspect diastolic heart failure. Start the patient on intravenous Lasix. Check echocardiogram. She is already on ACE inhibitor and beta blocker. Continue aspirin. Cycle cardiac markers and check TSH.  2. Diabetes. Hold metformin. Start insulin sliding scale insulin.  3. Hypertension. Continue lisinopril. Follow blood pressures.  4. Hyperlipidemia. Continue statin   DVT prophylaxis: lovenox Code Status: full code Family Communication: discussed with patient Disposition Plan: discharge home once improved Consults called:  Admission status:  inpatient, telemetry   Chene Kasinger MD [facility] Pager [phone]  If 7PM-7AM, please contact night-coverage [email or web address] Password [facility]  10/28/2016, 1:07 PM

## 2016-10-29 ENCOUNTER — Inpatient Hospital Stay (HOSPITAL_COMMUNITY): Payer: BLUE CROSS/BLUE SHIELD

## 2016-10-29 DIAGNOSIS — I509 Heart failure, unspecified: Secondary | ICD-10-CM

## 2016-10-29 DIAGNOSIS — I272 Pulmonary hypertension, unspecified: Secondary | ICD-10-CM | POA: Diagnosis present

## 2016-10-29 LAB — ECHOCARDIOGRAM COMPLETE
Height: 64 in
Weight: 3664.93 [oz_av]

## 2016-10-29 LAB — GLUCOSE, CAPILLARY
GLUCOSE-CAPILLARY: 149 mg/dL — AB (ref 65–99)
GLUCOSE-CAPILLARY: 200 mg/dL — AB (ref 65–99)
Glucose-Capillary: 248 mg/dL — ABNORMAL HIGH (ref 65–99)
Glucose-Capillary: 162 mg/dL — ABNORMAL HIGH (ref 65–99)

## 2016-10-29 LAB — BASIC METABOLIC PANEL
ANION GAP: 7 (ref 5–15)
BUN: 22 mg/dL — ABNORMAL HIGH (ref 6–20)
CHLORIDE: 102 mmol/L (ref 101–111)
CO2: 30 mmol/L (ref 22–32)
CREATININE: 0.53 mg/dL (ref 0.44–1.00)
Calcium: 8.6 mg/dL — ABNORMAL LOW (ref 8.9–10.3)
GFR calc non Af Amer: >60 mL/min (ref >60)
Glucose, Bld: 133 mg/dL — ABNORMAL HIGH (ref 65–99)
Potassium: 3.3 mmol/L — ABNORMAL LOW (ref 3.5–5.1)
SODIUM: 139 mmol/L (ref 135–145)

## 2016-10-29 LAB — TROPONIN I: Troponin I: <0.03 ng/mL (ref <0.03)

## 2016-10-29 MED ORDER — POTASSIUM CHLORIDE CRYS ER 20 MEQ PO TBCR
40.0000 meq | EXTENDED_RELEASE_TABLET | Freq: Once | ORAL | Status: AC
  Administered 2016-10-29: 40 meq via ORAL
  Filled 2016-10-29: qty 2

## 2016-10-29 MED ORDER — FUROSEMIDE 10 MG/ML IJ SOLN
60.0000 mg | Freq: Two times a day (BID) | INTRAMUSCULAR | Status: DC
  Administered 2016-10-30 – 2016-11-01 (×5): 60 mg via INTRAVENOUS
  Filled 2016-10-29 (×5): qty 6

## 2016-10-29 MED ORDER — INSULIN GLARGINE 100 UNIT/ML ~~LOC~~ SOLN
10.0000 [IU] | Freq: Every day | SUBCUTANEOUS | Status: DC
  Administered 2016-10-29 – 2016-10-31 (×3): 10 [IU] via SUBCUTANEOUS
  Filled 2016-10-29 (×6): qty 0.1

## 2016-10-29 NOTE — Progress Notes (Addendum)
Inpatient Diabetes Program Recommendations  AACE/ADA: New Consensus Statement on Inpatient Glycemic Control (2015)  Target Ranges:  Prepandial:   less than 140 mg/dL      Peak postprandial:   less than 180 mg/dL (1-2 hours)      Critically ill patients:  140 - 180 mg/dL  Results for FLORENTINE, LEISTER (MRN [id number]) as of 10/29/2016 11:58  Ref. Range 10/28/2016 16:29 10/28/2016 21:07 10/29/2016 07:28 10/29/2016 11:12  Glucose-Capillary Latest Ref Range: 65 - 99 mg/dL 242 (H) 142 (H) 149 (H) 200 (H)    Review of Glycemic Control  Diabetes history: DM2 Outpatient Diabetes medications: Metformin 1000 mg daily, Glipizide 5 mg QAM Current orders for Inpatient glycemic control: Novolog 0-15 units TID with meals, Novolog 0-5 units QHS  Inpatient Diabetes Program Recommendations: Insulin - Meal Coverage: While inpatient, please consider ordering Novolog 3 units TID with meals for meal coverage if patient eats at least 50% of meals. A1C: Please consider ordering an A1C to evaluate glycemic control over the past 2-3 months.  Thanks, Barnie Alderman, RN, MSN, CDE Diabetes Coordinator Inpatient Diabetes Program [phone] (Team Pager from 8am to 5pm)

## 2016-10-29 NOTE — Progress Notes (Signed)
PROGRESS NOTE    Natalie Contreras  [id number] DOB: December 05, 1965 DOA: 10/28/2016 PCP: [name], MD    Brief Narrative:  51 year old female admitted with progressive lower extremity edema who developed significant orthopnea prior to admission. Found to have decompensated CHF . Started on intravenous Lasix. She has significant volume overload and will likely be in the hospital several days.   Assessment & Plan:   Active Problems:   Acute diastolic CHF (congestive heart failure) (HCC)   Benign essential HTN   Hyperlipidemia   Diabetes mellitus type 2, noninsulin dependent ([place])   1. Acute c diastolic ongestive heart failure. Patient was started on Lasix 40 mg IV twice a day, but urine output has been unimpressive. Will increase to 60 mg twice a day . echocardiogram shows normal ejection fraction with grade 2 diastolic dysfunction. She is already on ACE inhibitor and beta blocker. Continue aspirin. TSH and cardiac markers are unremarkable.  2. Diabetes. Hold metformin and glipizide. She is on insulin sliding scale insulin. Blood sugars are running high. Will add Lantus.  3. Hypertension. Continue lisinopril and carvedilol. Follow blood pressures.  4. Hyperlipidemia. Continue statin  5. Pulmonary hypertension. Noted on echocardiogram with a PA pressure of 49 mmHg . would likely benefit from sleep study as an outpatient.   DVT prophylaxis: Lovenox Code Status: Full code Family Communication: Discussed with patient Disposition Plan: Discharge home once improved   Consultants:     Procedures:  Echo:- Left ventricle: The cavity size was normal. Wall thickness was   increased in a pattern of mild LVH. Systolic function was normal.   The estimated ejection fraction was in the range of 55% to 60%.   Wall motion was normal; there were no regional wall motion   abnormalities. Features are consistent with a pseudonormal left   ventricular filling pattern, with concomitant  abnormal relaxation   and increased filling pressure (grade 2 diastolic dysfunction).   Doppler parameters are consistent with high ventricular filling   pressure. - Aortic valve: Mildly calcified annulus. Trileaflet; mildly   thickened leaflets. Valve area (VTI): 1.65 cm^2. Valve area   (Vmax): 1.79 cm^2. Valve area (Vmean): 2.04 cm^2. - Mitral valve: Mildly calcified annulus. Mildly thickened leaflets   . There was mild regurgitation. - Left atrium: The atrium was moderately dilated. - Right atrium: The atrium was mildly dilated. - Pulmonary arteries: Systolic pressure was moderately increased.   PA peak pressure: 49 mm Hg (S). - Inferior vena cava: The vessel was dilated. The respirophasic   diameter changes were blunted (< 50%), consistent with elevated    central venous pressure.  Antimicrobials:      Subjective: Feeling better today. Feels that lower extremity edema is improving. Breathing is improving. No chest pain.  Objective: Vitals:   10/28/16 1954 10/28/16 2100 10/29/16 0456 10/29/16 1439  BP:  (!) 169/65 (!) 165/66 (!) 154/128  Pulse:  73 72 74  Resp:  18 18 20   Temp:  97.9 F (36.6 C) 97.8 F (36.6 C) 98.1 F (36.7 C)  TempSrc:  Oral Oral Oral  SpO2: 95% 97% 98% 95%  Weight:   103.9 kg (229 lb 0.9 oz)   Height:        Intake/Output Summary (Last 24 hours) at 10/29/16 1732 Last data filed at 10/29/16 1300  Gross per 24 hour  Intake              720 ml  Output  800 ml  Net              -80 ml   Filed Weights   10/28/16 0722 10/28/16 1027 10/29/16 0456  Weight: 99.8 kg (220 lb) 101.5 kg (223 lb 11.2 oz) 103.9 kg (229 lb 0.9 oz)    Examination:  General exam: Appears calm and comfortable  Respiratory system: Crackles at bases. Respiratory effort normal. Cardiovascular system: S1 & S2 heard, RRR. No JVD, murmurs, rubs, gallops or clicks. 2-3+ pedal edema. Gastrointestinal system: Abdomen is nondistended, soft and nontender. No  organomegaly or masses felt. Normal bowel sounds heard. Central nervous system: Alert and oriented. No focal neurological deficits. Extremities: Symmetric 5 x 5 power. Skin: erythema noted over lower extremities bilaterally Psychiatry: Judgement and insight appear normal. Mood & affect appropriate.     Data Reviewed: I have personally reviewed following labs and imaging studies  CBC:  Recent Labs Lab 10/28/16 0739  WBC 6.9  NEUTROABS 4.0  HGB 14.2  HCT 43.0  MCV 86.2  PLT 123XX123   Basic Metabolic Panel:  Recent Labs Lab 10/28/16 0739 10/29/16 0615  NA 139 139  K 3.5 3.3*  CL 101 102  CO2 29 30  GLUCOSE 205* 133*  BUN 23* 22*  CREATININE 0.65 0.53  CALCIUM 9.1 8.6*   GFR: Estimated Creatinine Clearance: 99.9 mL/min (by C-G formula based on SCr of 0.53 mg/dL). Liver Function Tests:  Recent Labs Lab 10/28/16 0739  AST 16  ALT 22  ALKPHOS 141*  BILITOT 0.4  PROT 6.7  ALBUMIN 3.0*   No results for input(s): LIPASE, AMYLASE in the last 168 hours. No results for input(s): AMMONIA in the last 168 hours. Coagulation Profile: No results for input(s): INR, PROTIME in the last 168 hours. Cardiac Enzymes:  Recent Labs Lab 10/28/16 0739 10/28/16 1405 10/28/16 1836 10/29/16 0055  TROPONINI <0.03 <0.03 <0.03 <0.03   BNP (last 3 results) No results for input(s): PROBNP in the last 8760 hours. HbA1C: No results for input(s): HGBA1C in the last 72 hours. CBG:  Recent Labs Lab 10/28/16 1629 10/28/16 2107 10/29/16 0728 10/29/16 1112 10/29/16 1610  GLUCAP 242* 142* 149* 200* 248*   Lipid Profile: No results for input(s): CHOL, HDL, LDLCALC, TRIG, CHOLHDL, LDLDIRECT in the last 72 hours. Thyroid Function Tests:  Recent Labs  10/28/16 1405  TSH 1.589   Anemia Panel: No results for input(s): VITAMINB12, FOLATE, FERRITIN, TIBC, IRON, RETICCTPCT in the last 72 hours. Sepsis Labs: No results for input(s): PROCALCITON, LATICACIDVEN in the last 168  hours.  No results found for this or any previous visit (from the past 240 hour(s)).       Radiology Studies: Dg Chest 2 View  Result Date: 10/28/2016 CLINICAL DATA:  Shortness of breath with lower extremity edema. Hypertension. EXAM: CHEST  2 VIEW COMPARISON:  None. FINDINGS: There is interstitial pulmonary edema bilaterally. There is no airspace consolidation. There is cardiomegaly with pulmonary venous hypertension. There is atherosclerotic calcification in the aorta. No bone lesions. No pneumothorax. IMPRESSION: Findings indicative of a degree of congestive heart failure. No airspace consolidation. Electronically Signed   By: Lowella Grip III M.D.   On: 10/28/2016 08:15        Scheduled Meds: . aspirin EC  81 mg Oral Daily  . carvedilol  3.125 mg Oral BID  . enoxaparin (LOVENOX) injection  50 mg Subcutaneous Q24H  . [START ON 10/30/2016] furosemide  60 mg Intravenous BID  . insulin aspart  0-15 Units Subcutaneous TID  WC  . insulin aspart  0-5 Units Subcutaneous QHS  . lisinopril  30 mg Oral Daily  . potassium chloride  40 mEq Oral Once  . pravastatin  40 mg Oral q1800  . sodium chloride flush  3 mL Intravenous Q12H   Continuous Infusions:   LOS: 1 day    Time spent: 60mins    Audree Schrecengost, MD [facility] Pager [phone]  If 7PM-7AM, please contact night-coverage [email or web address] Password [facility] 10/29/2016, 5:32 PM

## 2016-10-29 NOTE — Care Management Note (Signed)
  Case Management Note  Patient Details  Name: Natalie Contreras MRN: [id number] Date of Birth: 05-16-1966   Subjective/Objective:                  Pt admitted with CHF. She lives with family and is ind with ADL's. Pt has PCP, cardiologist, She drives herself to appointments. She weighs herself daily. She has insurance with drug coverage. She has no difficulty affording or managing medications. She plans to return home with self care. Has no HH or DME needs at this time.   Action/Plan: Pt plans to return home with self care. No CM needs.   Expected Discharge Date:    10/30/2016              Expected Discharge Plan:  Home/Self Care  In-House Referral:  NA  Discharge planning Services  CM Consult  Post Acute Care Choice:  NA Choice offered to:  NA  Status of Service:  Completed, signed off  Sherald Barge, RN 10/29/2016, 1:54 PM

## 2016-10-29 NOTE — Progress Notes (Signed)
*  PRELIMINARY RESULTS* Echocardiogram 2D Echocardiogram has been performed.  Natalie Contreras 10/29/2016, 10:54 AM

## 2016-10-30 DIAGNOSIS — J9601 Acute respiratory failure with hypoxia: Secondary | ICD-10-CM

## 2016-10-30 DIAGNOSIS — I5031 Acute diastolic (congestive) heart failure: Secondary | ICD-10-CM

## 2016-10-30 DIAGNOSIS — E119 Type 2 diabetes mellitus without complications: Secondary | ICD-10-CM

## 2016-10-30 LAB — BASIC METABOLIC PANEL
Anion gap: 8 (ref 5–15)
BUN: 21 mg/dL — AB (ref 6–20)
CO2: 29 mmol/L (ref 22–32)
CREATININE: 0.63 mg/dL (ref 0.44–1.00)
Calcium: 8.4 mg/dL — ABNORMAL LOW (ref 8.9–10.3)
Chloride: 102 mmol/L (ref 101–111)
Glucose, Bld: 203 mg/dL — ABNORMAL HIGH (ref 65–99)
Potassium: 3.2 mmol/L — ABNORMAL LOW (ref 3.5–5.1)
SODIUM: 139 mmol/L (ref 135–145)

## 2016-10-30 LAB — GLUCOSE, CAPILLARY
GLUCOSE-CAPILLARY: 158 mg/dL — AB (ref 65–99)
GLUCOSE-CAPILLARY: 206 mg/dL — AB (ref 65–99)
Glucose-Capillary: 138 mg/dL — ABNORMAL HIGH (ref 65–99)
Glucose-Capillary: 137 mg/dL — ABNORMAL HIGH (ref 65–99)

## 2016-10-30 LAB — PREGNANCY, URINE: Preg Test, Ur: NEGATIVE

## 2016-10-30 MED ORDER — NICOTINE 21 MG/24HR TD PT24
21.0000 mg | MEDICATED_PATCH | Freq: Every day | TRANSDERMAL | Status: DC
  Administered 2016-10-30 – 2016-11-01 (×3): 21 mg via TRANSDERMAL
  Filled 2016-10-30 (×3): qty 1

## 2016-10-30 MED ORDER — POTASSIUM CHLORIDE CRYS ER 20 MEQ PO TBCR
40.0000 meq | EXTENDED_RELEASE_TABLET | Freq: Two times a day (BID) | ORAL | Status: AC
  Administered 2016-10-30 (×2): 40 meq via ORAL
  Filled 2016-10-30 (×2): qty 2

## 2016-10-30 MED ORDER — INSULIN ASPART 100 UNIT/ML ~~LOC~~ SOLN
3.0000 [IU] | Freq: Three times a day (TID) | SUBCUTANEOUS | Status: DC
  Administered 2016-10-30 – 2016-11-01 (×5): 3 [IU] via SUBCUTANEOUS

## 2016-10-30 NOTE — Progress Notes (Signed)
PROGRESS NOTE  Natalie Contreras [id number] DOB: 10/16/1965 DOA: 10/28/2016 PCP: [name], MD  Brief Narrative: 51 year old woman PMH hypertension, hyperlipidemia, diabetes mellitus type 2 on oral medications, presented with increasing lower extremity edema over the last 2 months resistant to Lasix. Admitted for treatment of acute heart failure.  Assessment/Plan 1. Acute diastolic congestive heart failure, new diagnosis.  Now starting to respond increased dose Lasix. Continue high-dose IV Lasix and will follow I/L. Still has significant volume overload.  2. Acute hypoxic respiratory failure secondary to above.  Wean oxygen as tolerated  3. Diabetes mellitus type 2, stable.  NovoLog 3 units with meals. Check hemoglobin A1c.  4. Mild pulmonary hypertension. Consider outpatient sleep study.  5. Tobacco use disorder.  Nicotine patch.   Appears stable to still has massive volume overload. Anticipate will need several days of IV diuresis.  Check urine pregnancy  DVT prophylaxis: enoxaparin Code Status: full code Family Communication: none Disposition Plan: home   [name], MD  [facility] Direct contact: [phone] --[place]ia [place]  --[email or web address]; password [email or web address]  7PM-7AM contact night coverage as above 10/30/2016, 1:02 PM  LOS: 2 days   Consultants:    Procedures:  2-d echo Study Conclusions  - Left ventricle: The cavity size was normal. Wall thickness was   increased in a pattern of mild LVH. Systolic function was normal.   The estimated ejection fraction was in the range of 55% to 60%.   Wall motion was normal; there were no regional wall motion   abnormalities. Features are consistent with a pseudonormal left   ventricular filling pattern, with concomitant abnormal relaxation   and increased filling pressure (grade 2 diastolic dysfunction).   Doppler parameters are consistent with high ventricular filling   pressure. - Aortic valve:  Mildly calcified annulus. Trileaflet; mildly   thickened leaflets. Valve area (VTI): 1.65 cm^2. Valve area   (Vmax): 1.79 cm^2. Valve area (Vmean): 2.04 cm^2. - Mitral valve: Mildly calcified annulus. Mildly thickened leaflets   . There was mild regurgitation. - Left atrium: The atrium was moderately dilated. - Right atrium: The atrium was mildly dilated. - Pulmonary arteries: Systolic pressure was moderately increased.   PA peak pressure: 49 mm Hg (S). - Inferior vena cava: The vessel was dilated. The respirophasic   diameter changes were blunted (< 50%), consistent with elevated   central venous pressure. - Technically adequate study.  Antimicrobials:    Interval history/Subjective: Feels much better. Decreased lower extremity edema. Breathing well. No complaints. Wants to go home. Reports significant urinary output.  Objective: Vitals:   10/29/16 0456 10/29/16 1439 10/29/16 2211 10/30/16 0645  BP: (!) 165/66 (!) 154/128 (!) 157/57 (!) 156/66  Pulse: 72 74 73 75  Resp: 18 20 20 17   Temp: 97.8 F (36.6 C) 98.1 F (36.7 C) 97.8 F (36.6 C) 97.1 F (36.2 C)  TempSrc: Oral Oral Oral Oral  SpO2: 98% 95% 92% 97%  Weight: 103.9 kg (229 lb 0.9 oz)   102 kg (224 lb 13.9 oz)  Height:        Intake/Output Summary (Last 24 hours) at 10/30/16 1302 Last data filed at 10/30/16 1100  Gross per 24 hour  Intake              960 ml  Output              600 ml  Net              360 ml  Filed Weights   10/28/16 1027 10/29/16 0456 10/30/16 0645  Weight: 101.5 kg (223 lb 11.2 oz) 103.9 kg (229 lb 0.9 oz) 102 kg (224 lb 13.9 oz)    Exam:    Constitutional:   Appears calm, comfortable, sitting in chair. Respiratory:   Clear to auscultation anteriorly. Posterior crackles on the left. No rhonchi or wheezes. Normal respiratory effort. Cardiovascular:   Regular rate and rhythm. No murmur, rub or gallop.  3+ bilateral lower extremity edema Psychiatric:   Alert, speech fluent  and clear. Grossly normal mood and affect.   I have personally reviewed following labs and imaging studies:  Urine output 1400  -700 since admission  Blood sugars stable  Potassium 3.2, BUN and creatinine without significant change.  TSH within normal limits.  Scheduled Meds: . aspirin EC  81 mg Oral Daily  . carvedilol  3.125 mg Oral BID  . enoxaparin (LOVENOX) injection  50 mg Subcutaneous Q24H  . furosemide  60 mg Intravenous BID  . insulin aspart  0-15 Units Subcutaneous TID WC  . insulin aspart  0-5 Units Subcutaneous QHS  . insulin glargine  10 Units Subcutaneous QHS  . lisinopril  30 mg Oral Daily  . pravastatin  40 mg Oral q1800  . sodium chloride flush  3 mL Intravenous Q12H   Continuous Infusions:  Active Problems:   Acute diastolic CHF (congestive heart failure) (HCC)   Benign essential HTN   Hyperlipidemia   Diabetes mellitus type 2, noninsulin dependent ([place])   Pulmonary hypertension   LOS: 2 days

## 2016-10-30 NOTE — Progress Notes (Signed)
Went in the patients room to assess her and the room was full of the smell of cigarette smoke.  The NT who had her today also stated that she could smell the smoke.  I asked her if she was smoking in her room and she denied that she had. She states she does not have cigarettes in the room and I could check her stuff.  I told her that I was not going to do that but, smoking is not allowed in the room.   I went over the smoking policy with her and she verbalized understanding.   Dr Sarajane Jews was made aware of the patient most likely smoking in her room.

## 2016-10-30 NOTE — Progress Notes (Signed)
Inpatient Diabetes Program Recommendations  AACE/ADA: New Consensus Statement on Inpatient Glycemic Control (2015)  Target Ranges:  Prepandial:   less than 140 mg/dL      Peak postprandial:   less than 180 mg/dL (1-2 hours)      Critically ill patients:  140 - 180 mg/dL   Results for SHAKINAH, BECVAR (MRN [id number]) as of 10/30/2016 10:45  Ref. Range 10/29/2016 07:28 10/29/2016 11:12 10/29/2016 16:10 10/29/2016 22:04  Glucose-Capillary Latest Ref Range: 65 - 99 mg/dL 149 (H) 200 (H) 248 (H) 162 (H)     Review of Glycemic Control  Diabetes history: DM2  Outpatient DM meds: Metformin 1000 mg daily, Glipizide 5 mg QAM  Current orders: Novolog Moderate Correction Scale/ SSI (0-15 units) TID AC + HS     Lantus 10 units QHS     MD- Please consider the following in-hospital insulin adjustments:  1. Start Novolog 3 units TID with meals for meal coverage if patient eats at least 50% of meals.  2. Order an A1C to evaluate glycemic control over the past 2-3 months.    --Will follow patient during hospitalization--  Wyn Quaker RN, MSN, CDE Diabetes Coordinator Inpatient Glycemic Control Team Team Pager: [phone] (8a-5p)

## 2016-10-31 DIAGNOSIS — F17200 Nicotine dependence, unspecified, uncomplicated: Secondary | ICD-10-CM

## 2016-10-31 LAB — BASIC METABOLIC PANEL
ANION GAP: 6 (ref 5–15)
BUN: 19 mg/dL (ref 6–20)
CO2: 32 mmol/L (ref 22–32)
Calcium: 8.7 mg/dL — ABNORMAL LOW (ref 8.9–10.3)
Chloride: 102 mmol/L (ref 101–111)
Creatinine, Ser: 0.55 mg/dL (ref 0.44–1.00)
GFR calc Af Amer: >60 mL/min (ref >60)
GLUCOSE: 123 mg/dL — AB (ref 65–99)
POTASSIUM: 3.6 mmol/L (ref 3.5–5.1)
Sodium: 140 mmol/L (ref 135–145)

## 2016-10-31 LAB — GLUCOSE, CAPILLARY
GLUCOSE-CAPILLARY: 140 mg/dL — AB (ref 65–99)
Glucose-Capillary: 125 mg/dL — ABNORMAL HIGH (ref 65–99)
Glucose-Capillary: 130 mg/dL — ABNORMAL HIGH (ref 65–99)
Glucose-Capillary: 177 mg/dL — ABNORMAL HIGH (ref 65–99)

## 2016-10-31 NOTE — Progress Notes (Signed)
PROGRESS NOTE  Natalie Contreras [id number] DOB: [birth year] DOA: 10/28/2016 PCP: [name], MD  Brief Narrative: 51 year old woman PMH hypertension, hyperlipidemia, diabetes mellitus type 2 on oral medications, presented with increasing lower extremity edema over the last 2 months resistant to Lasix. Admitted for treatment of acute heart failure.  Assessment/Plan 1. Acute diastolic congestive heart failure, new diagnosis.  Improving but still has significant volume overload. Continue current management with IV Lasix.  2. Acute hypoxic respiratory failure secondary to above.  Resolved.  3. Diabetes mellitus type 2, stable.  Blood sugars stable. Continue current management.  4. Mild pulmonary hypertension.   Consider outpatient sleep study.  5. Tobacco use disorder.  Continue nicotine patch.   Improving with decreasing lower extremity edema although still has significant volume overload. If continues to respond well, anticipate discharge home on oral diuretics in the next 48 hours.  DVT prophylaxis: enoxaparin Code Status: full code Family Communication: none Disposition Plan: home   [name], MD  [facility] Direct contact: [phone] --Via [place]  --[email or web address]; password [email or web address]  7PM-7AM contact night coverage as above 10/31/2016, 12:47 PM  LOS: 3 days   Consultants:    Procedures:  2-d echo Study Conclusions  - Left ventricle: The cavity size was normal. Wall thickness was   increased in a pattern of mild LVH. Systolic function was normal.   The estimated ejection fraction was in the range of 55% to 60%.   Wall motion was normal; there were no regional wall motion   abnormalities. Features are consistent with a pseudonormal left   ventricular filling pattern, with concomitant abnormal relaxation   and increased filling pressure (grade 2 diastolic dysfunction).   Doppler parameters are consistent with high ventricular filling  pressure. - Aortic valve: Mildly calcified annulus. Trileaflet; mildly   thickened leaflets. Valve area (VTI): 1.65 cm^2. Valve area   (Vmax): 1.79 cm^2. Valve area (Vmean): 2.04 cm^2. - Mitral valve: Mildly calcified annulus. Mildly thickened leaflets   . There was mild regurgitation. - Left atrium: The atrium was moderately dilated. - Right atrium: The atrium was mildly dilated. - Pulmonary arteries: Systolic pressure was moderately increased.   PA peak pressure: 49 mm Hg (S). - Inferior vena cava: The vessel was dilated. The respirophasic   diameter changes were blunted (< 50%), consistent with elevated   central venous pressure. - Technically adequate study.  Antimicrobials:    Interval history/Subjective: Continues to feel better. Less swelling in the legs. Breathing well.  Objective: Vitals:   10/30/16 1441 10/30/16 2023 10/30/16 2027 10/31/16 0702  BP: (!) 189/69 (!) 181/61  (!) 161/62  Pulse: 66 67  79  Resp: 16 18  18   Temp: 97.8 F (36.6 C) 97.5 F (36.4 C)  98.6 F (37 C)  TempSrc: Oral Oral  Oral  SpO2: 98% 94% 95% 93%  Weight:    96.9 kg (213 lb 9.6 oz)  Height:        Intake/Output Summary (Last 24 hours) at 10/31/16 1247 Last data filed at 10/31/16 0900  Gross per 24 hour  Intake              480 ml  Output              500 ml  Net              -20 ml     Filed Weights   10/29/16 0456 10/30/16 0645 10/31/16 0702  Weight: 103.9 kg (229 lb 0.9 oz)  102 kg (224 lb 13.9 oz) 96.9 kg (213 lb 9.6 oz)    Exam:    Constitutional. Appears calm, comfortable, sitting in chair.  Respiratory. [place] crackles. No wheezes or rhonchi. Normal respiratory effort.  Cardiovascular. Regular rate and rhythm. No murmur, rub or gallop. 3+ bilateral lower extremity edema, however improving.   Skin. Some bulla over bilateral lower legs.  Psychiatric. Alert. Speech fluent and clear. Grossly normal mood and affect.   I have personally reviewed following labs  and imaging studies:  Urine output not completely recorded. I/O unreliable.  BUN and creatinine stable. Potassium within normal limits.  Urine pregnancy negative.  Scheduled Meds: . aspirin EC  81 mg Oral Daily  . carvedilol  3.125 mg Oral BID  . enoxaparin (LOVENOX) injection  50 mg Subcutaneous Q24H  . furosemide  60 mg Intravenous BID  . insulin aspart  0-15 Units Subcutaneous TID WC  . insulin aspart  0-5 Units Subcutaneous QHS  . insulin aspart  3 Units Subcutaneous TID WC  . insulin glargine  10 Units Subcutaneous QHS  . lisinopril  30 mg Oral Daily  . nicotine  21 mg Transdermal Daily  . pravastatin  40 mg Oral q1800  . sodium chloride flush  3 mL Intravenous Q12H   Continuous Infusions:  Principal Problem:   Acute diastolic CHF (congestive heart failure) (HCC) Active Problems:   Benign essential HTN   Hyperlipidemia   Diabetes mellitus type 2, noninsulin dependent (HCC)   Pulmonary hypertension   Acute respiratory failure with hypoxia ([place])   LOS: 3 days

## 2016-11-01 DIAGNOSIS — I272 Pulmonary hypertension, unspecified: Secondary | ICD-10-CM

## 2016-11-01 LAB — BASIC METABOLIC PANEL
ANION GAP: 9 (ref 5–15)
BUN: 23 mg/dL — ABNORMAL HIGH (ref 6–20)
CALCIUM: 8.9 mg/dL (ref 8.9–10.3)
CO2: 32 mmol/L (ref 22–32)
Chloride: 99 mmol/L — ABNORMAL LOW (ref 101–111)
Creatinine, Ser: 0.66 mg/dL (ref 0.44–1.00)
GLUCOSE: 133 mg/dL — AB (ref 65–99)
Potassium: 3.4 mmol/L — ABNORMAL LOW (ref 3.5–5.1)
SODIUM: 140 mmol/L (ref 135–145)

## 2016-11-01 LAB — GLUCOSE, CAPILLARY
GLUCOSE-CAPILLARY: 176 mg/dL — AB (ref 65–99)
Glucose-Capillary: 108 mg/dL — ABNORMAL HIGH (ref 65–99)

## 2016-11-01 LAB — HEMOGLOBIN A1C
HEMOGLOBIN A1C: 9.2 % — AB (ref 4.8–5.6)
MEAN PLASMA GLUCOSE: 217 mg/dL

## 2016-11-01 MED ORDER — POTASSIUM CHLORIDE CRYS ER 20 MEQ PO TBCR
40.0000 meq | EXTENDED_RELEASE_TABLET | Freq: Every day | ORAL | 0 refills | Status: DC
Start: 2016-11-01 — End: 2017-04-15

## 2016-11-01 MED ORDER — POTASSIUM CHLORIDE CRYS ER 20 MEQ PO TBCR
40.0000 meq | EXTENDED_RELEASE_TABLET | Freq: Two times a day (BID) | ORAL | Status: DC
  Administered 2016-11-01: 40 meq via ORAL
  Filled 2016-11-01: qty 2

## 2016-11-01 NOTE — Progress Notes (Signed)
Pt IV removed, tolerated well.  Reviewed discharge instructions with pt and answered all questions.

## 2016-11-01 NOTE — Discharge Summary (Addendum)
Physician Discharge Summary  Natalie Contreras [id number] DOB: 10/03/1965 DOA: 10/28/2016  PCP: [name], MD  Admit date: 10/28/2016 Discharge date: 11/01/2016  Recommendations for Outpatient Follow-up:  1. Ongoing treatment for diastolic congestive heart failure. Lasix increased to twice daily. Started on oral potassium. 2. Continue to encourage smoking cessation. 3. Consider outpatient sleep study  Follow-up Information    [name], MD Follow up in 1 week(s).   Specialty:  Internal Medicine Why:  Call office for appointment Contact information: [address] [place] [address] [phone]          Discharge Diagnoses:  1. Acute diastolic congestive heart failure 2. Acute hypoxic respiratory failure secondary to heart failure 3. Diabetes mellitus type 2 4. Mild pulmonary hypertension 5. Tobacco use disorder  Discharge Condition: Improved Disposition: Home  Diet recommendation: Heart healthy diabetic diet  Filed Weights   10/30/16 0645 10/31/16 0702 11/01/16 0609  Weight: 102 kg (224 lb 13.9 oz) 96.9 kg (213 lb 9.6 oz) 95 kg (209 lb 6.4 oz)    History of present illness:  51 year old woman PMH hypertension, hyperlipidemia, diabetes mellitus type 2 on oral medications, presented with increasing lower extremity edema over the last 2 months resistant to Lasix. Admitted for treatment of acute heart failure.  Hospital Course:  Patient was treated with IV diuretics with gradual clinical improvement, excellent diuresis and significant reduction in peripheral edema. Weight down approximately 15 pounds. Echocardiogram revealed diastolic dysfunction. On discharge Lasix was increased to twice daily and potassium was added. She has follow-up arranged with her primary care physician 2/17 therefore will defer further treatment, adjustment of Lasix, as needed BMP and consideration for cardiology involvement as an outpatient to her primary care physician.  Acute hypoxic  respiratory failure resolved with treatment of CHF. Diabetes remained stable. Mild pulmonary hypertension was noted, could consider outpatient sleep study. Recommend smoking cessation.  Procedures:  2-d echo Study Conclusions  - Left ventricle: The cavity size was normal. Wall thickness was increased in a pattern of mild LVH. Systolic function was normal. The estimated ejection fraction was in the range of 55% to 60%. Wall motion was normal; there were no regional wall motion abnormalities. Features are consistent with a pseudonormal left ventricular filling pattern, with concomitant abnormal relaxation and increased filling pressure (grade 2 diastolic dysfunction). Doppler parameters are consistent with high ventricular filling pressure. - Aortic valve: Mildly calcified annulus. Trileaflet; mildly thickened leaflets. Valve area (VTI): 1.65 cm^2. Valve area (Vmax): 1.79 cm^2. Valve area (Vmean): 2.04 cm^2. - Mitral valve: Mildly calcified annulus. Mildly thickened leaflets . There was mild regurgitation. - Left atrium: The atrium was moderately dilated. - Right atrium: The atrium was mildly dilated. - Pulmonary arteries: Systolic pressure was moderately increased. PA peak pressure: 49 mm Hg (S). - Inferior vena cava: The vessel was dilated. The respirophasic diameter changes were blunted (<50%), consistent with elevated central venous pressure. - Technically adequate study.  Today: Subjective. Feeling much better. Breathing well. Decreasing lower extremity edema.  Objective. Afebrile, temperature 97.7, pulse 67, respirations 18, blood pressure 167/72. SPO2 93% on room air.  Appears calm, comfortable. Cardiovascular regular rate and rhythm. No murmur, rub or gallop. Decreasing lower extremity edema, 2+.  Respiratory clear to auscultation bilaterally. No wheezes, rales or rhonchi. Normal respiratory effort.  Psychiatric. Normal mood and affect.  Speech fluent and appropriate.  Discharge Instructions  Discharge Instructions    (HEART FAILURE PATIENTS) Call MD:  Anytime you have any of the following symptoms: 1) 3  pound weight gain in 24 hours or 5 pounds in 1 week 2) shortness of breath, with or without a dry hacking cough 3) swelling in the hands, feet or stomach 4) if you have to sleep on extra pillows at night in order to breathe.    Complete by:  As directed    Diet - low sodium heart healthy    Complete by:  As directed    Discharge instructions    Complete by:  As directed    Call your physician or seek immediate medical attention for weight gain, worsening swelling, pain, shortness of breath or worsening of condition.   Heart Failure patients record your daily weight using the same scale at the same time of day    Complete by:  As directed    Increase activity slowly    Complete by:  As directed      Allergies as of 11/01/2016   No Known Allergies     Medication List    STOP taking these medications   dicloxacillin 500 MG capsule Commonly known as:  DYNAPEN     TAKE these medications   aspirin EC 81 MG tablet Take 81 mg by mouth daily.   carvedilol 3.125 MG tablet Commonly known as:  COREG Take 1 tablet by mouth 2 (two) times daily.   furosemide 40 MG tablet Commonly known as:  LASIX Take 1 tablet by mouth daily.   glipiZIDE 5 MG tablet Commonly known as:  GLUCOTROL Take 5 mg by mouth daily before breakfast.   lisinopril 30 MG tablet Commonly known as:  PRINIVIL,ZESTRIL Take 1 tablet by mouth daily.   lovastatin 40 MG tablet Commonly known as:  MEVACOR Take 1 tablet by mouth at bedtime.   metFORMIN 1000 MG tablet Commonly known as:  GLUCOPHAGE Take 1,000 mg by mouth daily.   potassium chloride SA 20 MEQ tablet Commonly known as:  K-DUR,KLOR-CON Take 2 tablets (40 mEq total) by mouth daily.      No Known Allergies  The results of significant diagnostics from this hospitalization (including  imaging, microbiology, ancillary and laboratory) are listed below for reference.    Significant Diagnostic Studies: Dg Chest 2 View  Result Date: 10/28/2016 CLINICAL DATA:  Shortness of breath with lower extremity edema. Hypertension. EXAM: CHEST  2 VIEW COMPARISON:  None. FINDINGS: There is interstitial pulmonary edema bilaterally. There is no airspace consolidation. There is cardiomegaly with pulmonary venous hypertension. There is atherosclerotic calcification in the aorta. No bone lesions. No pneumothorax. IMPRESSION: Findings indicative of a degree of congestive heart failure. No airspace consolidation. Electronically Signed   By: Lowella Grip III M.D.   On: 10/28/2016 08:15    Microbiology: No results found for this or any previous visit (from the past 240 hour(s)).   Labs: Basic Metabolic Panel:  Recent Labs Lab 10/28/16 0739 10/29/16 0615 10/30/16 0435 10/31/16 0559 11/01/16 0502  NA 139 139 139 140 140  K 3.5 3.3* 3.2* 3.6 3.4*  CL 101 102 102 102 99*  CO2 29 30 29  32 32  GLUCOSE 205* 133* 203* 123* 133*  BUN 23* 22* 21* 19 23*  CREATININE 0.65 0.53 0.63 0.55 0.66  CALCIUM 9.1 8.6* 8.4* 8.7* 8.9   Liver Function Tests:  Recent Labs Lab 10/28/16 0739  AST 16  ALT 22  ALKPHOS 141*  BILITOT 0.4  PROT 6.7  ALBUMIN 3.0*   CBC:  Recent Labs Lab 10/28/16 0739  WBC 6.9  NEUTROABS 4.0  HGB 14.2  HCT 43.0  MCV 86.2  PLT 275   Cardiac Enzymes:  Recent Labs Lab 10/28/16 0739 10/28/16 1405 10/28/16 1836 10/29/16 0055  TROPONINI <0.03 <0.03 <0.03 <0.03    Recent Labs  10/28/16 0739  BNP 220.0*    CBG:  Recent Labs Lab 10/31/16 1110 10/31/16 1659 10/31/16 2125 11/01/16 0751 11/01/16 1124  GLUCAP 140* 130* 177* 108* 176*    Principal Problem:   Acute diastolic CHF (congestive heart failure) (HCC) Active Problems:   Benign essential HTN   Hyperlipidemia   Diabetes mellitus type 2, noninsulin dependent ([place])   Pulmonary  hypertension   Acute respiratory failure with hypoxia ([place])   Time coordinating discharge: 25 minutes  Signed:  [name], MD [facility] 11/01/2016, 12:16 PM

## 2016-12-19 ENCOUNTER — Ambulatory Visit (INDEPENDENT_AMBULATORY_CARE_PROVIDER_SITE_OTHER): Payer: BLUE CROSS/BLUE SHIELD | Admitting: Cardiology

## 2016-12-19 ENCOUNTER — Encounter: Payer: Self-pay | Admitting: Cardiology

## 2016-12-19 VITALS — BP 150/74 | HR 72 | Ht 64.0 in | Wt 199.0 lb

## 2016-12-19 DIAGNOSIS — R0989 Other specified symptoms and signs involving the circulatory and respiratory systems: Secondary | ICD-10-CM | POA: Diagnosis not present

## 2016-12-19 DIAGNOSIS — I5033 Acute on chronic diastolic (congestive) heart failure: Secondary | ICD-10-CM

## 2016-12-19 DIAGNOSIS — Z79899 Other long term (current) drug therapy: Secondary | ICD-10-CM

## 2016-12-19 DIAGNOSIS — I10 Essential (primary) hypertension: Secondary | ICD-10-CM | POA: Diagnosis not present

## 2016-12-19 DIAGNOSIS — E119 Type 2 diabetes mellitus without complications: Secondary | ICD-10-CM | POA: Diagnosis not present

## 2016-12-19 MED ORDER — FUROSEMIDE 40 MG PO TABS: 40.0000 mg | ORAL_TABLET | Freq: Every day | ORAL | 2 refills | Status: DC

## 2016-12-19 NOTE — Progress Notes (Signed)
Clinical Summary Natalie Contreras is a 51 y.o.female seen as new patient, she is referred by Dr Maudie Mercury  1. Heart murmur - echo 10/2016 without significant valvular dysfunction - no recent symptoms  2. Chronic diastolic HF - patient admitted 10/2016 with volume overload - echo 10/2016 LVEF 55-60%, grade II diastolic dysfunction - diuresed 15 lbs, discharge weight 209 lbs  - home weight have been around 199 lbs. Still with LE edema. Occasional SOB. Net diuresis looks to have slowed.  - limiting salt intake. Avoid NSAIDs.    3. OSA screen - pcp is referring for evaluation  4. HTN - does not check regularly at home - ran out lisionpril x 2 weeks  5. DM2 - followed by pcp, she is on oral agents only - 10/2016 HgbA1c 9.2  Past Medical History:  Diagnosis Date  . Diabetes mellitus without complication ([place])   . Hypertension      No Known Allergies   Current Outpatient Prescriptions  Medication Sig Dispense Refill  . aspirin EC 81 MG tablet Take 81 mg by mouth daily.    . carvedilol (COREG) 3.125 MG tablet Take 1 tablet by mouth 2 (two) times daily.  4  . furosemide (LASIX) 40 MG tablet Take 1 tablet by mouth daily.  2  . glipiZIDE (GLUCOTROL) 5 MG tablet Take 5 mg by mouth daily before breakfast.    . lisinopril (PRINIVIL,ZESTRIL) 30 MG tablet Take 1 tablet by mouth daily.  2  . lovastatin (MEVACOR) 40 MG tablet Take 1 tablet by mouth at bedtime.   2  . metFORMIN (GLUCOPHAGE) 1000 MG tablet Take 1,000 mg by mouth daily.   10  . potassium chloride SA (K-DUR,KLOR-CON) 20 MEQ tablet Take 2 tablets (40 mEq total) by mouth daily. 60 tablet 0   No current facility-administered medications for this visit.      Past Surgical History:  Procedure Laterality Date  . CATARACT EXTRACTION W/PHACO Left 02/08/2016   Procedure: CATARACT EXTRACTION PHACO AND INTRAOCULAR LENS PLACEMENT (IOC);  Surgeon: Tonny , MD;  Location: [facility];  Service: Ophthalmology;  Laterality: Left;  CDE 11.43     . CATARACT EXTRACTION W/PHACO Right 02/26/2016   Procedure: CATARACT EXTRACTION PHACO AND INTRAOCULAR LENS PLACEMENT RIGHT; CDE:  16.90;  Surgeon: Tonny , MD;  Location: [facility];  Service: Ophthalmology;  Laterality: Right;  . CHOLECYSTECTOMY       No Known Allergies     Family History  Problem Relation Age of Onset  . Lung cancer Mother   . Lung cancer Father   . AAA (abdominal aortic aneurysm) Brother      Social History Ms. Cocke reports that she has been smoking Cigarettes.  She has a 25.00 pack-year smoking history. She uses smokeless tobacco. Ms. Holdren reports that she does not drink alcohol.   Review of Systems CONSTITUTIONAL: No weight loss, fever, chills, weakness or fatigue.  HEENT: Eyes: No visual loss, blurred vision, double vision or yellow sclerae.No hearing loss, sneezing, congestion, runny nose or sore throat.  SKIN: No rash or itching.  CARDIOVASCULAR: no chest pain RESPIRATORY: No shortness of breath, cough or sputum.  GASTROINTESTINAL: No anorexia, nausea, vomiting or diarrhea. No abdominal pain or blood.  GENITOURINARY: No burning on urination, no polyuria NEUROLOGICAL: No headache, dizziness, syncope, paralysis, ataxia, numbness or tingling in the extremities. No change in bowel or bladder control.  MUSCULOSKELETAL: No muscle, back pain, joint pain or stiffness.  LYMPHATICS: No enlarged nodes. No history of splenectomy.  PSYCHIATRIC: No history of depression or anxiety.  ENDOCRINOLOGIC: No reports of sweating, cold or heat intolerance. No polyuria or polydipsia.  [name]   Physical Examination Vitals:   12/19/16 0824 12/19/16 0827  BP: (!) 152/78 (!) 150/74  Pulse: 72    Vitals:   12/19/16 0824  Weight: 199 lb (90.3 kg)  Height: 5\' 4"  (1.626 m)    Gen: resting comfortably, no acute distress HEENT: no scleral icterus, pupils equal round and reactive, no palptable cervical adenopathy,  CV: RRR, 2/6 systolic murmur at apex, no jvd. Bilateral carotid  bruits Resp: Clear to auscultation bilaterally GI: abdomen is soft, non-tender, non-distended, normal bowel sounds, no hepatosplenomegaly MSK: extremities are warm, 1+ bilateral LE edema Skin: warm, no rash Neuro:  no focal deficits Psych: appropriate affect      Assessment and Plan  1. Acute on chronic diastolic HF - remains volume overloaded. We will increase lasix to 40mg  in AM and 20mg  in pm, check BMET/Mg  2. Carotid bruits - order carotid US  3. HTN - elevated in clinic, follow with increased diuresis  4. DM2  - not at goal - continued therapy by pcp    F/u 3-4 week with PA.    Arnoldo Lenis, M.D.

## 2016-12-19 NOTE — Patient Instructions (Signed)
Medication Instructions:  INCREASE LASIX TO 40 MG IN THE MORNING & 20 MG IN THE EVENING   Labwork: Your physician recommends that you return for lab work in: [place]   Testing/Procedures: Your physician has requested that you have a carotid duplex. This test is an ultrasound of the carotid arteries in your neck. It looks at blood flow through these arteries that supply the brain with blood. Allow one hour for this exam. There are no restrictions or special instructions.    Follow-Up: Your physician recommends that you schedule a follow-up appointment in: 3-4 WEEKS    Any Other Special Instructions Will Be Listed Below (If Applicable).     If you need a refill on your cardiac medications before your next appointment, please call your pharmacy.

## 2016-12-23 LAB — MAGNESIUM: MAGNESIUM: 1.9 mg/dL (ref 1.5–2.5)

## 2016-12-23 LAB — BASIC METABOLIC PANEL
BUN: 30 mg/dL — ABNORMAL HIGH (ref 7–25)
CALCIUM: 8.5 mg/dL — AB (ref 8.6–10.2)
CO2: 31 mmol/L (ref 20–31)
CREATININE: 0.7 mg/dL (ref 0.50–1.10)
Chloride: 106 mmol/L (ref 98–110)
GLUCOSE: 140 mg/dL — AB (ref 65–99)
Potassium: 4.2 mmol/L (ref 3.5–5.3)
SODIUM: 140 mmol/L (ref 135–146)

## 2016-12-25 ENCOUNTER — Ambulatory Visit (HOSPITAL_COMMUNITY)
Admission: RE | Admit: 2016-12-25 | Discharge: 2016-12-25 | Disposition: A | Discharge status: Home / Self Care | Payer: BLUE CROSS/BLUE SHIELD | Source: Ambulatory Visit | Attending: Cardiology | Admitting: Cardiology

## 2016-12-25 ENCOUNTER — Telehealth: Payer: Self-pay

## 2016-12-25 DIAGNOSIS — I6523 Occlusion and stenosis of bilateral carotid arteries: Secondary | ICD-10-CM | POA: Diagnosis not present

## 2016-12-25 DIAGNOSIS — R0989 Other specified symptoms and signs involving the circulatory and respiratory systems: Secondary | ICD-10-CM

## 2016-12-25 NOTE — Telephone Encounter (Signed)
Called pt. Left message for pt to return call.  

## 2016-12-25 NOTE — Telephone Encounter (Signed)
-----   Message from Arnoldo Lenis, MD sent at 12/25/2016  2:33 PM EDT ----- Carotid US shows moderate blockages on both sides, we will continue to monitor at this time  J BrancH MD

## 2017-01-15 ENCOUNTER — Encounter: Payer: Self-pay | Admitting: Physician Assistant

## 2017-01-15 ENCOUNTER — Ambulatory Visit (INDEPENDENT_AMBULATORY_CARE_PROVIDER_SITE_OTHER): Payer: BLUE CROSS/BLUE SHIELD | Admitting: Physician Assistant

## 2017-01-15 VITALS — BP 160/78 | HR 67 | Ht 64.0 in | Wt 201.6 lb

## 2017-01-15 DIAGNOSIS — I6523 Occlusion and stenosis of bilateral carotid arteries: Secondary | ICD-10-CM | POA: Diagnosis not present

## 2017-01-15 DIAGNOSIS — I5031 Acute diastolic (congestive) heart failure: Secondary | ICD-10-CM | POA: Diagnosis not present

## 2017-01-15 DIAGNOSIS — E785 Hyperlipidemia, unspecified: Secondary | ICD-10-CM

## 2017-01-15 DIAGNOSIS — H539 Unspecified visual disturbance: Secondary | ICD-10-CM | POA: Diagnosis not present

## 2017-01-15 DIAGNOSIS — I10 Essential (primary) hypertension: Secondary | ICD-10-CM | POA: Diagnosis not present

## 2017-01-15 DIAGNOSIS — Z720 Tobacco use: Secondary | ICD-10-CM | POA: Insufficient documentation

## 2017-01-15 HISTORY — DX: Occlusion and stenosis of bilateral carotid arteries: I65.23

## 2017-01-15 MED ORDER — HYDRALAZINE HCL 50 MG PO TABS: 50.0000 mg | ORAL_TABLET | Freq: Three times a day (TID) | ORAL | 3 refills | Status: DC

## 2017-01-15 MED ORDER — FUROSEMIDE 40 MG PO TABS: 40.0000 mg | ORAL_TABLET | Freq: Two times a day (BID) | ORAL | 3 refills | Status: DC

## 2017-01-15 NOTE — Progress Notes (Signed)
Cardiology Office Note    Date:  01/15/2017   ID:  Natalie, Contreras April 22, 1966, MRN [id number]  PCP:  [name], MD  Cardiologist: Dr. Harl Bowie   Chief Complaint  Patient presents with  . Follow-up    History of Present Illness:  Natalie Contreras is a 51 y.o. female  who saw Dr. Harl Bowie for the first time 12/19/16 with acute on chronic diastolic heart failure. Lasix was increased to 40 mg in the morning 20 in the evening. 2-D echo 10/2016 LVEF 55-60% with grade 2 DD, baseline weight 199 pounds, hypertension, DM 2, moderate carotid disease.  Patient comes in today for two-week follow-up. Her legs remain swollen but she thinks are low better. Her weight is up 2 pounds. She continues to eat canned foods. Her blood pressure is elevated. She is also complaining of visual changes with dizziness at times. She says it feels like when she goes out in the sun and comes back in and can't see. She's had many episodes in the past year. She had cataracts removed last year. Still smoking just under a pack of cigarettes daily.    Past Medical History:  Diagnosis Date  . Diabetes mellitus without complication ([place])   . Hypertension     Past Surgical History:  Procedure Laterality Date  . CATARACT EXTRACTION W/PHACO Left 02/08/2016   Procedure: CATARACT EXTRACTION PHACO AND INTRAOCULAR LENS PLACEMENT (IOC);  Surgeon: Tonny Branch, MD;  Location: [facility];  Service: Ophthalmology;  Laterality: Left;  CDE 11.43   . CATARACT EXTRACTION W/PHACO Right 02/26/2016   Procedure: CATARACT EXTRACTION PHACO AND INTRAOCULAR LENS PLACEMENT RIGHT; CDE:  16.90;  Surgeon: Tonny Branch, MD;  Location: [facility];  Service: Ophthalmology;  Laterality: Right;  . CHOLECYSTECTOMY      Current Medications: Outpatient Medications Prior to Visit  Medication Sig Dispense Refill  . aspirin EC 81 MG tablet Take 81 mg by mouth daily.    . carvedilol (COREG) 3.125 MG tablet Take 1 tablet by mouth 2 (two) times daily.  4  . glipiZIDE  (GLUCOTROL) 5 MG tablet Take 5 mg by mouth daily before breakfast.    . lisinopril (PRINIVIL,ZESTRIL) 30 MG tablet Take 1 tablet by mouth daily.  2  . lovastatin (MEVACOR) 40 MG tablet Take 1 tablet by mouth at bedtime.   2  . metFORMIN (GLUCOPHAGE) 1000 MG tablet Take 1,000 mg by mouth daily.   10  . potassium chloride SA (K-DUR,KLOR-CON) 20 MEQ tablet Take 2 tablets (40 mEq total) by mouth daily. 60 tablet 0  . furosemide (LASIX) 40 MG tablet Take 1 tablet (40 mg total) by mouth daily. Take 40 mg in the am, take 20 mg in the pm 45 tablet 2  . hydrALAZINE (APRESOLINE) 25 MG tablet Take 25 mg by mouth 3 (three) times daily.   0   No facility-administered medications prior to visit.      Allergies:   Patient has no known allergies.   Social History   Social History  . Marital status: Single    Spouse name: N/A  . Number of children: N/A  . Years of education: N/A   Social History Main Topics  . Smoking status: Current Every Day Smoker    Packs/day: 0.75    Years: 25.00    Types: Cigarettes    Start date: 09/16/1984  . Smokeless tobacco: Current User  . Alcohol use No  . Drug use: No  . Sexual activity: Yes  Birth control/ protection: None   Other Topics Concern  . None   Social History Narrative  . None     Family History:  The patient's   family history includes AAA (abdominal aortic aneurysm) in her brother; Lung cancer in her father and mother.   ROS:   Please see the history of present illness.    Review of Systems  Constitution: Positive for weakness, malaise/fatigue and weight gain.  HENT: Negative.   Eyes: Positive for visual disturbance.  Cardiovascular: Positive for leg swelling.  Respiratory: Negative.   Hematologic/Lymphatic: Negative.   Musculoskeletal: Negative.  Negative for joint pain.  Gastrointestinal: Negative.   Genitourinary: Negative.    All other systems reviewed and are negative.   PHYSICAL EXAM:   VS:  BP (!) 160/78   Pulse 67   Ht 5'  4" (1.626 m)   Wt 201 lb 9.6 oz (91.4 kg)   SpO2 97%   BMI 34.60 kg/m   Physical Exam  GEN: Obese, in no acute distress smells of cigarette smoke Neck:carotid bruits, no JVD, or masses Cardiac:RRR; no murmurs, rubs, or gallops  Respiratory:  Decreased breath sounds butclear to auscultation bilaterally, normal work of breathing GI: soft, nontender, nondistended, + BS Ext:3-4+ edema on the right, 2-3+ edema on the left Neuro:  Alert and Oriented x 3 Psych: euthymic mood, full affect  Wt Readings from Last 3 Encounters:  01/15/17 201 lb 9.6 oz (91.4 kg)  12/19/16 199 lb (90.3 kg)  11/01/16 209 lb 6.4 oz (95 kg)      Studies/Labs Reviewed:   EKG:  EKG is not ordered today.    Recent Labs: 10/28/2016: ALT 22; B Natriuretic Peptide 220.0; Hemoglobin 14.2; Platelets 275; TSH 1.589 12/23/2016: BUN 30; Creat 0.70; Magnesium 1.9; Potassium 4.2; Sodium 140   Lipid Panel No results found for: CHOL, TRIG, HDL, CHOLHDL, VLDL, LDLCALC, LDLDIRECT  Additional studies/ records that were reviewed today include:  Carotid Dopplers 4/2018IMPRESSION: 1. 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 2. Origin stenoses of the external carotid arteries, left worse than right, may account for the bruits on physical exam. 3.  Antegrade bilateral vertebral arterial flow.     Electronically Signed   By: Lucrezia Europe M.D.   On: 12/25/2016 09:16    2-D echo 2/2018Study Conclusions   - Left ventricle: The cavity size was normal. Wall thickness was   increased in a pattern of mild LVH. Systolic function was normal.   The estimated ejection fraction was in the range of 55% to 60%.   Wall motion was normal; there were no regional wall motion   abnormalities. Features are consistent with a pseudonormal left   ventricular filling pattern, with concomitant abnormal relaxation   and increased filling pressure (grade 2 diastolic dysfunction).    Doppler parameters are consistent with high ventricular filling   pressure. - Aortic valve: Mildly calcified annulus. Trileaflet; mildly   thickened leaflets. Valve area (VTI): 1.65 cm^2. Valve area   (Vmax): 1.79 cm^2. Valve area (Vmean): 2.04 cm^2. - Mitral valve: Mildly calcified annulus. Mildly thickened leaflets   . There was mild regurgitation. - Left atrium: The atrium was moderately dilated. - Right atrium: The atrium was mildly dilated. - Pulmonary arteries: Systolic pressure was moderately increased.   PA peak pressure: 49 mm Hg (S). - Inferior vena cava: The vessel was dilated. The respirophasic   diameter changes were blunted (< 50%), consistent with elevated   central  venous pressure. - Technically adequate study.      ASSESSMENT:    1. Acute diastolic CHF (congestive heart failure) ([place])   2. Benign essential HTN   3. Hyperlipidemia, unspecified hyperlipidemia type   4. Carotid stenosis, asymptomatic, bilateral   5. Visual disturbance   6. Tobacco abuse      PLAN:  In order of problems listed above:  Acute on chronic diastolic CHF patients with actually up 2 pounds and she continues to have edema although she thinks it might be marginally better. Blood pressure is also elevated. Increase Lasix to 40 mg twice a day, increase hydralazine to 50 mg 3 times a day,BMET today, 2 g sodium diet discussed with patient and given a diet to follow. She refuses to wear compression stockings. Follow-up with myself or Dr. Harl Bowie in 2 weeks.  Essential hypertension elevated increase hydralazine to 50 mg 3 times a day  Hyperlipidemia on Mevacor  Carotid disease with moderate stenosis bilaterally doubt causing visual disturbances. We'll follow.  Visual disturbances recommend follow-up with ophthalmologist and if every  Tobacco abuse smoking cessation discussed   Medication Adjustments/Labs and Tests Ordered: Current medicines are reviewed at length with the patient today.   Concerns regarding medicines are outlined above.  Medication changes, Labs and Tests ordered today are listed in the Patient Instructions below. Patient Instructions  Your physician recommends that you schedule a follow-up appointment in: 2 Weeks with Ermalinda Barrios, PA-C.   Your physician recommends that you schedule a follow-up appointment in: 1 Month with Dr. Harl Bowie.   Your physician has recommended you make the following change in your medication:  Increase Lasix to 40 mg Two Times Daily  Increase Hydralazine to 50 mg Three Times Daily   Your physician discussed the hazards of tobacco use. Tobacco use cessation is recommended and techniques and options to help you quit were discussed.  Please see Dr. Maudie Mercury for vision   You have been given a low salt diet.   If you need a refill on your cardiac medications before your next appointment, please call your pharmacy.  Thank you for choosing [facility]!         Sumner Boast, PA-C  01/15/2017 11:21 AM    [facility] [place], [place], [place]  [address] Phone: [phone]; Fax: [phone]

## 2017-01-15 NOTE — Patient Instructions (Addendum)
Your physician recommends that you schedule a follow-up appointment in: 2 Weeks with Ermalinda Barrios, PA-C.   Your physician recommends that you schedule a follow-up appointment in: 1 Month with Dr. Harl Bowie.   Your physician has recommended you make the following change in your medication:  Increase Lasix to 40 mg Two Times Daily  Increase Hydralazine to 50 mg Three Times Daily   Your physician discussed the hazards of tobacco use. Tobacco use cessation is recommended and techniques and options to help you quit were discussed.  Please see Dr. Maudie Mercury for vision   You have been given a low salt diet.   If you need a refill on your cardiac medications before your next appointment, please call your pharmacy.  Thank you for choosing [facility]!

## 2017-01-17 LAB — BASIC METABOLIC PANEL
BUN: 33 mg/dL — AB (ref 7–25)
CHLORIDE: 105 mmol/L (ref 98–110)
CO2: 30 mmol/L (ref 20–31)
CREATININE: 0.73 mg/dL (ref 0.50–1.10)
Calcium: 8.5 mg/dL — ABNORMAL LOW (ref 8.6–10.2)
Glucose, Bld: 162 mg/dL — ABNORMAL HIGH (ref 65–99)
POTASSIUM: 4 mmol/L (ref 3.5–5.3)
Sodium: 141 mmol/L (ref 135–146)

## 2017-01-22 ENCOUNTER — Telehealth: Payer: Self-pay

## 2017-01-22 NOTE — Telephone Encounter (Signed)
lmtcb for lab results

## 2017-01-28 ENCOUNTER — Encounter: Payer: Self-pay | Admitting: *Deleted

## 2017-01-29 ENCOUNTER — Ambulatory Visit (INDEPENDENT_AMBULATORY_CARE_PROVIDER_SITE_OTHER): Payer: BLUE CROSS/BLUE SHIELD | Admitting: Physician Assistant

## 2017-01-29 ENCOUNTER — Encounter: Payer: Self-pay | Admitting: Physician Assistant

## 2017-01-29 VITALS — BP 140/72 | HR 73 | Ht 64.0 in | Wt 207.0 lb

## 2017-01-29 DIAGNOSIS — I10 Essential (primary) hypertension: Secondary | ICD-10-CM | POA: Diagnosis not present

## 2017-01-29 DIAGNOSIS — I6523 Occlusion and stenosis of bilateral carotid arteries: Secondary | ICD-10-CM | POA: Diagnosis not present

## 2017-01-29 DIAGNOSIS — E785 Hyperlipidemia, unspecified: Secondary | ICD-10-CM | POA: Diagnosis not present

## 2017-01-29 DIAGNOSIS — Z720 Tobacco use: Secondary | ICD-10-CM

## 2017-01-29 DIAGNOSIS — I5031 Acute diastolic (congestive) heart failure: Secondary | ICD-10-CM

## 2017-01-29 MED ORDER — FUROSEMIDE 40 MG PO TABS: ORAL_TABLET | ORAL | 3 refills | Status: DC

## 2017-01-29 NOTE — Patient Instructions (Signed)
Your physician recommends that you schedule a follow-up appointment Dr. Harl Bowie  Your physician has recommended you make the following change in your medication:   Increase Lasix to 60 mg in the morning and 40 mg in the Evening  You have been referred to the Lymphedema clinic   If you need a refill on your cardiac medications before your next appointment, please call your pharmacy.  Thank you for choosing [facility]!

## 2017-01-29 NOTE — Progress Notes (Signed)
Cardiology Office Note    Date:  01/29/2017   ID:  Krisy, Dix 1966-05-22, MRN [id number]  PCP:  [name], MD  Cardiologist: Dr. Harl Bowie  Chief Complaint  Patient presents with  . Follow-up    History of Present Illness:  Natalie Contreras is a 51 y.o. female   who saw Dr. Harl Bowie for the first time 12/19/16 with acute on chronic diastolic heart failure. Lasix was increased to 40 mg in the morning 20 in the evening. 2-D echo 10/2016 LVEF 55-60% with grade 2 DD, baseline weight 199 pounds, hypertension, DM 2, moderate carotid disease.   I saw the patient 01/15/17 in f/u and weight up 2 lbs and legs remained swollen. She was eating a lot of canned foods. BP high and some dizziness. Was smoking. I increased lasix to 40 mg BID, hydralazine 50 mg TID, 2 gm sodium diet.   bmet 5/4 stable crt 0.73.  Patient comes in today with 6 pound weight gain and ongoing edema problems. She feels like her edema goes down at night and is actually a little bit better but when she is on her feet all day working at the [employer] she has significant edema. She says she does not eat out and she has cut back on her salt dramatically. She says she is not eating any canned foods. Blood pressure is a little better today. She has not missed any doses of medications.  Past Medical History:  Diagnosis Date  . Diabetes mellitus without complication ([place])   . Hypertension     Past Surgical History:  Procedure Laterality Date  . CATARACT EXTRACTION W/PHACO Left 02/08/2016   Procedure: CATARACT EXTRACTION PHACO AND INTRAOCULAR LENS PLACEMENT (IOC);  Surgeon: Tonny Branch, MD;  Location: [facility];  Service: Ophthalmology;  Laterality: Left;  CDE 11.43   . CATARACT EXTRACTION W/PHACO Right 02/26/2016   Procedure: CATARACT EXTRACTION PHACO AND INTRAOCULAR LENS PLACEMENT RIGHT; CDE:  16.90;  Surgeon: Tonny Branch, MD;  Location: [facility];  Service: Ophthalmology;  Laterality: Right;  . CHOLECYSTECTOMY      Current  Medications: Outpatient Medications Prior to Visit  Medication Sig Dispense Refill  . aspirin EC 81 MG tablet Take 81 mg by mouth daily.    . carvedilol (COREG) 3.125 MG tablet Take 1 tablet by mouth 2 (two) times daily.  4  . glipiZIDE (GLUCOTROL) 5 MG tablet Take 5 mg by mouth daily before breakfast.    . hydrALAZINE (APRESOLINE) 50 MG tablet Take 1 tablet (50 mg total) by mouth 3 (three) times daily. 270 tablet 3  . lisinopril (PRINIVIL,ZESTRIL) 30 MG tablet Take 1 tablet by mouth daily.  2  . lovastatin (MEVACOR) 40 MG tablet Take 1 tablet by mouth at bedtime.   2  . metFORMIN (GLUCOPHAGE) 1000 MG tablet Take 1,000 mg by mouth daily.   10  . potassium chloride SA (K-DUR,KLOR-CON) 20 MEQ tablet Take 2 tablets (40 mEq total) by mouth daily. 60 tablet 0  . furosemide (LASIX) 40 MG tablet Take 1 tablet (40 mg total) by mouth 2 (two) times daily. 180 tablet 3   No facility-administered medications prior to visit.      Allergies:   Patient has no known allergies.   Social History   Social History  . Marital status: Single    Spouse name: N/A  . Number of children: N/A  . Years of education: N/A   Social History Main Topics  . Smoking status: Current Every  Day Smoker    Packs/day: 0.75    Years: 25.00    Types: Cigarettes    Start date: 09/16/1984  . Smokeless tobacco: Current User  . Alcohol use No  . Drug use: No  . Sexual activity: Yes    Birth control/ protection: None   Other Topics Concern  . None   Social History Narrative  . None     Family History:  The patient's family history includes AAA (abdominal aortic aneurysm) in her brother; Lung cancer in her father and mother.   ROS:   Please see the history of present illness.    Review of Systems  Constitution: Positive for weight gain.  HENT: Negative.   Eyes: Negative.   Cardiovascular: Positive for leg swelling.  Respiratory: Negative.   Hematologic/Lymphatic: Negative.   Musculoskeletal: Positive for joint  pain, muscle cramps and stiffness.  Gastrointestinal: Negative.   Genitourinary: Negative.   Neurological: Negative.    All other systems reviewed and are negative.   PHYSICAL EXAM:   VS:  BP 140/72   Pulse 73   Ht 5\' 4"  (1.626 m)   Wt 207 lb (93.9 kg)   SpO2 96%   BMI 35.53 kg/m   Physical Exam  GEN: Obese, in no acute distress  Neck: Bilateral carotid bruits, no JVD, or masses Cardiac:RRR; no murmurs, rubs, or gallops  Respiratory:  clear to auscultation bilaterally, normal work of breathing GI: soft, nontender, nondistended, + BS Ext: 3-4+ edema on the right lower extremity, 2-3+ edema on the left, brawny changes bilaterally Neuro:  Alert and Oriented x 3 Psych: euthymic mood, full affect  Wt Readings from Last 3 Encounters:  01/29/17 207 lb (93.9 kg)  01/15/17 201 lb 9.6 oz (91.4 kg)  12/19/16 199 lb (90.3 kg)      Studies/Labs Reviewed:   EKG:  EKG is not ordered today.   Recent Labs: 10/28/2016: ALT 22; B Natriuretic Peptide 220.0; Hemoglobin 14.2; Platelets 275; TSH 1.589 12/23/2016: Magnesium 1.9 01/17/2017: BUN 33; Creat 0.73; Potassium 4.0; Sodium 141   Lipid Panel No results found for: CHOL, TRIG, HDL, CHOLHDL, VLDL, LDLCALC, LDLDIRECT  Additional studies/ records that were reviewed today include:   Carotid Dopplers 4/2018IMPRESSION: 1. 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 2. Origin stenoses of the external carotid arteries, left worse than right, may account for the bruits on physical exam. 3.  Antegrade bilateral vertebral arterial flow.     Electronically Signed   By: Lucrezia Europe M.D.   On: 12/25/2016 09:16     2-D echo 2/2018Study Conclusions   - Left ventricle: The cavity size was normal. Wall thickness was   increased in a pattern of mild LVH. Systolic function was normal.   The estimated ejection fraction was in the range of 55% to 60%.   Wall motion was normal; there were  no regional wall motion   abnormalities. Features are consistent with a pseudonormal left   ventricular filling pattern, with concomitant abnormal relaxation   and increased filling pressure (grade 2 diastolic dysfunction).   Doppler parameters are consistent with high ventricular filling   pressure. - Aortic valve: Mildly calcified annulus. Trileaflet; mildly   thickened leaflets. Valve area (VTI): 1.65 cm^2. Valve area   (Vmax): 1.79 cm^2. Valve area (Vmean): 2.04 cm^2. - Mitral valve: Mildly calcified annulus. Mildly thickened leaflets   . There was mild regurgitation. - Left atrium: The atrium was moderately dilated. - Right atrium: The  atrium was mildly dilated. - Pulmonary arteries: Systolic pressure was moderately increased.   PA peak pressure: 49 mm Hg (S). - Inferior vena cava: The vessel was dilated. The respirophasic   diameter changes were blunted (< 50%), consistent with elevated   central venous pressure. - Technically adequate study.       ASSESSMENT:    1. Acute diastolic CHF (congestive heart failure) ([place])   2. Benign essential HTN   3. Carotid stenosis, asymptomatic, bilateral   4. Hyperlipidemia, unspecified hyperlipidemia type   5. Tobacco abuse      PLAN:  In order of problems listed above:  Acute diastolic CHF weight continues to creep up despite increase Lasix. Edema is significant. Blood pressure is better. Will increase Lasix to 60 mg in the morning 40 in the afternoon. Referred to lymphedema clinic, check lab work today, follow-up with Dr. branch in 2-3 weeks. Continue 2 g sodium diet.  Benign essential hypertension improved with increase hydralazine  Carotid stenosis with moderate stenosis 40-59% on Dopplers 12/2016. Continue to monitor closely  Hyperlipidemia on Mevacor 40 mg daily  Tobacco abuse smoking cessation discussed with patient. She has cut back to less than a pack per day but is not going to quit yet. Medication Adjustments/Labs and  Tests Ordered: Current medicines are reviewed at length with the patient today.  Concerns regarding medicines are outlined above.  Medication changes, Labs and Tests ordered today are listed in the Patient Instructions below. There are no Patient Instructions on file for this visit.   Sumner Boast, PA-C  01/29/2017 11:12 AM    [facility] [place], [place], [place]  [address] Phone: [phone]; Fax: [phone]

## 2017-01-30 NOTE — Addendum Note (Signed)
Addended by: Levonne Hubert on: 01/30/2017 10:10 AM   Modules accepted: Orders

## 2017-02-04 LAB — BASIC METABOLIC PANEL
BUN: 28 mg/dL — ABNORMAL HIGH (ref 7–25)
CALCIUM: 8.9 mg/dL (ref 8.6–10.2)
CO2: 29 mmol/L (ref 20–31)
CREATININE: 0.69 mg/dL (ref 0.50–1.10)
Chloride: 106 mmol/L (ref 98–110)
Glucose, Bld: 146 mg/dL — ABNORMAL HIGH (ref 65–99)
Potassium: 3.7 mmol/L (ref 3.5–5.3)
Sodium: 143 mmol/L (ref 135–146)

## 2017-02-05 IMAGING — US US EXTREM LOW VENOUS BILAT
1 series · 13 of 24 positions shown · non-contrast
Comparison: None.

CLINICAL DATA: 50-year-old female with a history of swelling



[Series 1: us extrem low venous bilat · 0.08mm/px · 13 of 61 slices shown]
[im 1/61]
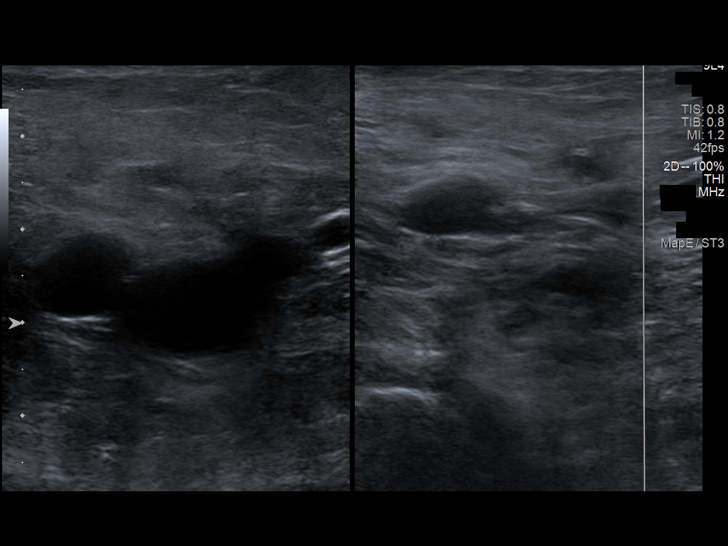
[im 6/61]
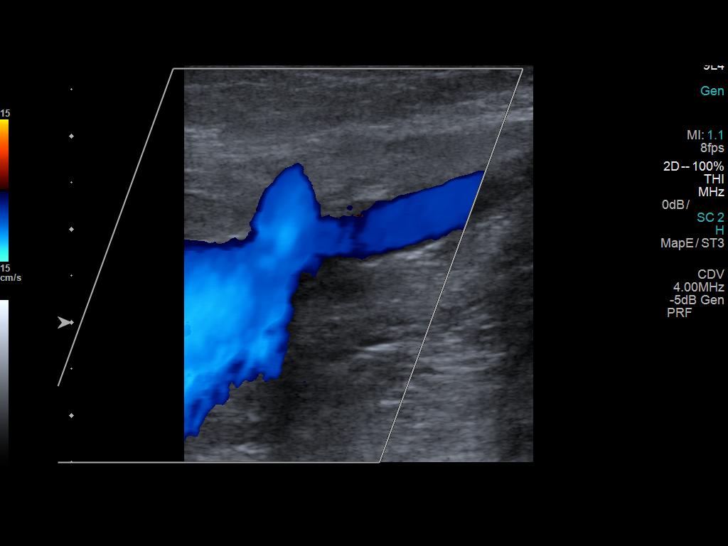
[im 11/61]
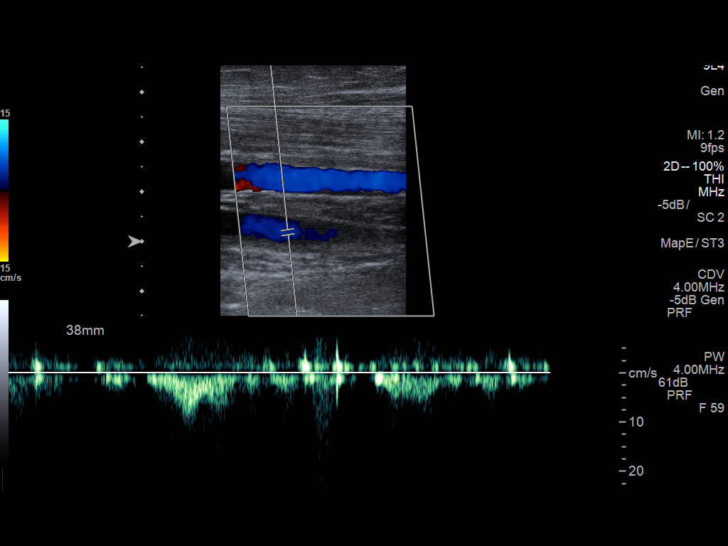
[im 16/61]
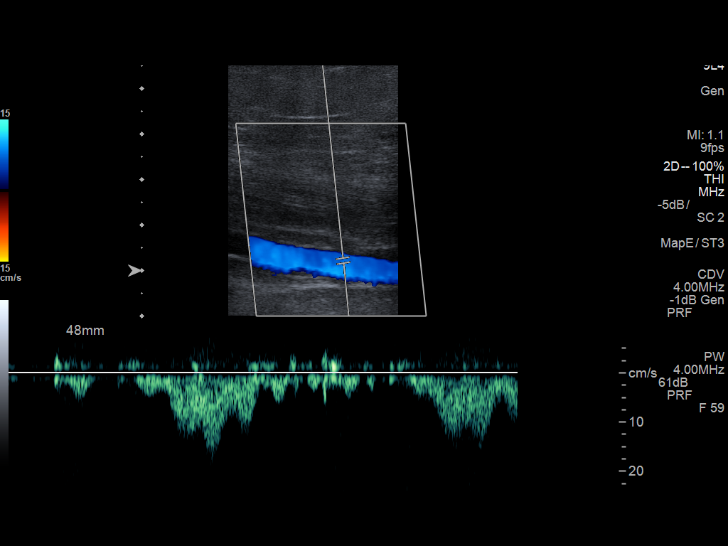
[im 21/61]
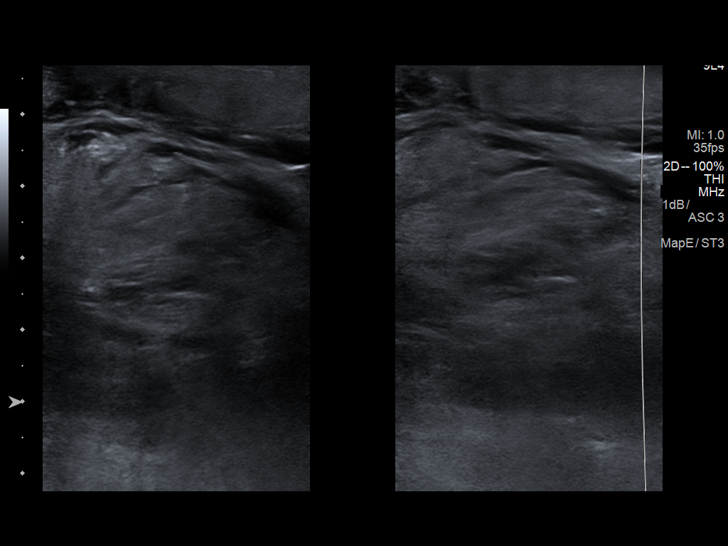
[im 27/61]
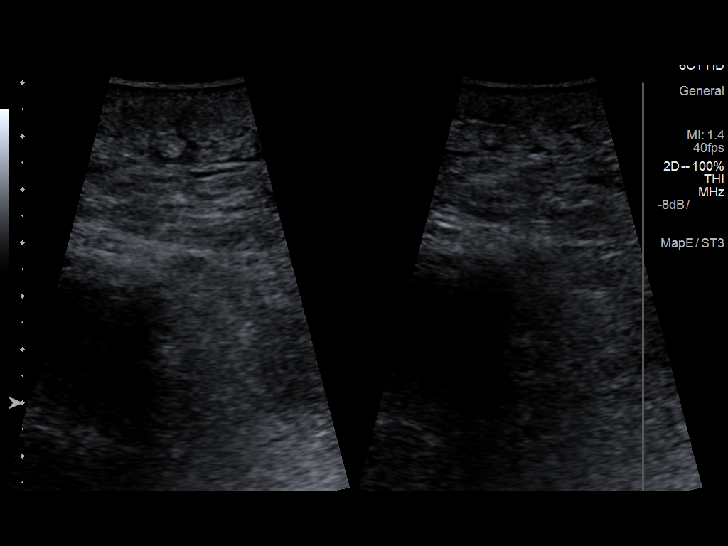
[im 32/61]
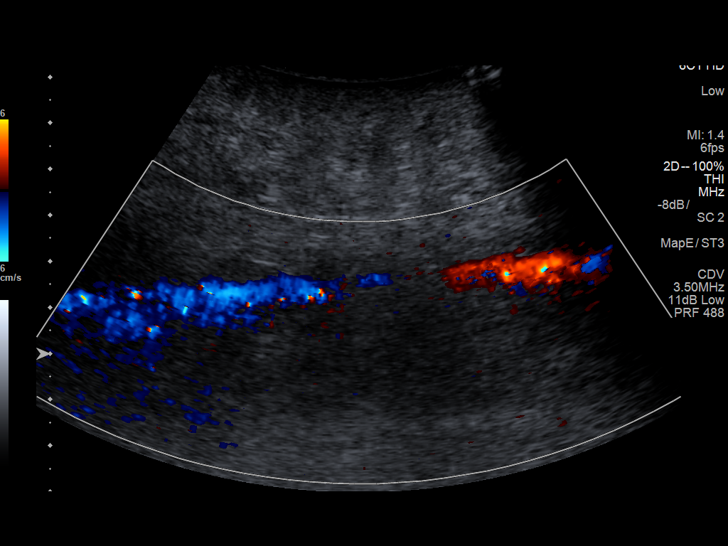
[im 34/61]
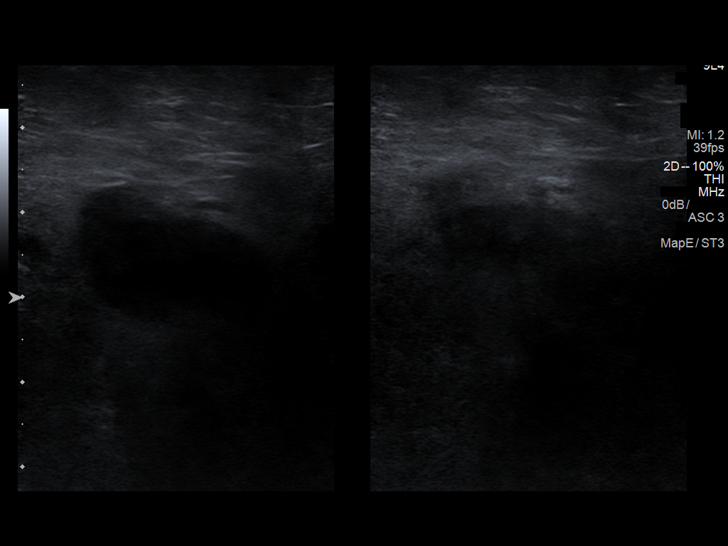
[im 40/61]
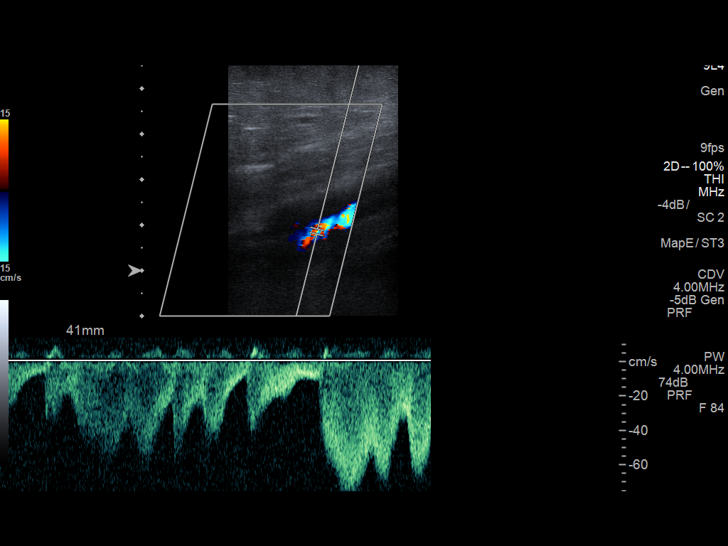
[im 45/61]
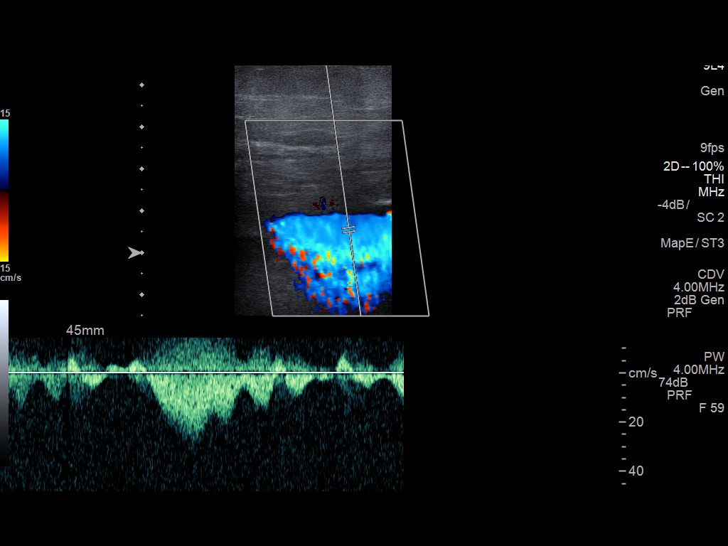
[im 50/61]
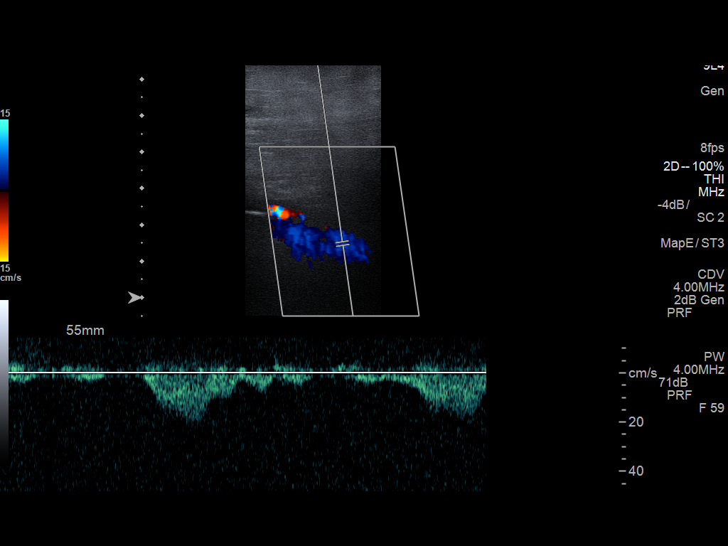
[im 55/61]
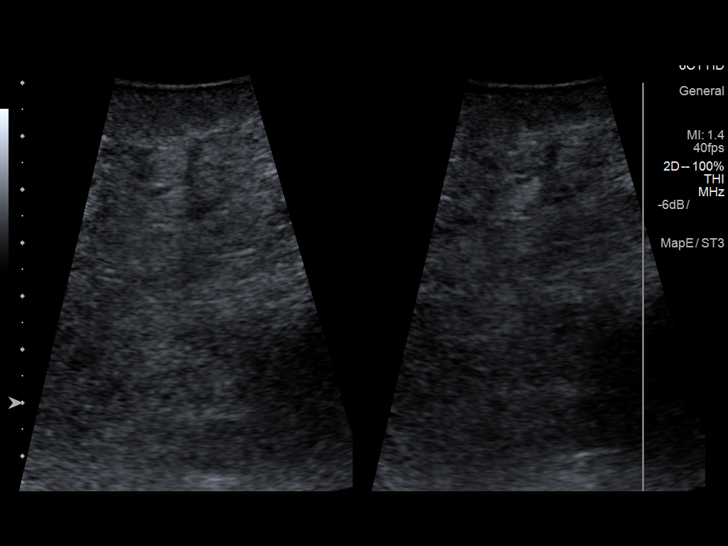
[im 61/61]
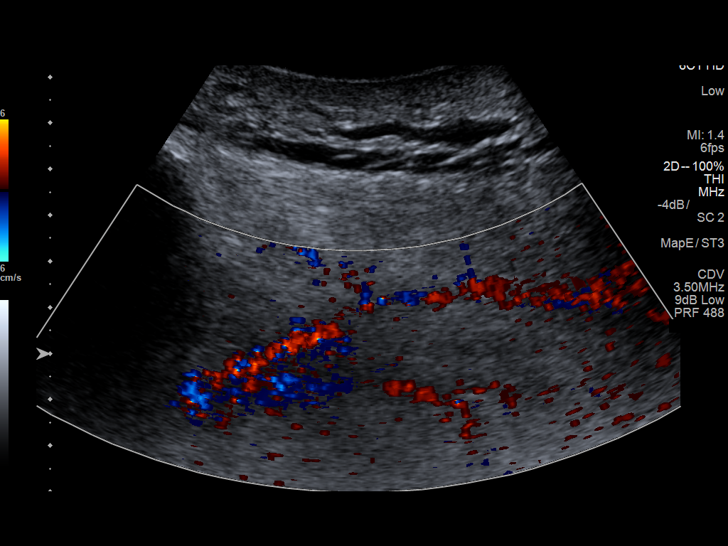

[13 of 24 positions shown; findings below may reference images not displayed]

FINDINGS: RIGHT LOWER EXTREMITY

Common Femoral Vein: No evidence of thrombus. Normal
compressibility, respiratory phasicity and response to augmentation.

Saphenofemoral Junction: No evidence of thrombus. Normal
compressibility and flow on color Doppler imaging.

Profunda Femoral Vein: No evidence of thrombus. Normal
compressibility and flow on color Doppler imaging.

Femoral Vein: No evidence of thrombus. Normal compressibility,
respiratory phasicity and response to augmentation.

Popliteal Vein: No evidence of thrombus. Normal compressibility,
respiratory phasicity and response to augmentation.

Calf Veins: No evidence of thrombus. Normal compressibility and flow
on color Doppler imaging.

Superficial Great Saphenous Vein: No evidence of thrombus. Normal
compressibility and flow on color Doppler imaging.

Other Findings:  Edema

LEFT LOWER EXTREMITY

Common Femoral Vein: No evidence of thrombus. Normal
compressibility, respiratory phasicity and response to augmentation.

Saphenofemoral Junction: No evidence of thrombus. Normal
compressibility and flow on color Doppler imaging.

Profunda Femoral Vein: No evidence of thrombus. Normal
compressibility and flow on color Doppler imaging.

Femoral Vein: No evidence of thrombus. Normal compressibility,
respiratory phasicity and response to augmentation.

Popliteal Vein: No evidence of thrombus. Normal compressibility,
respiratory phasicity and response to augmentation.

Calf Veins: No evidence of thrombus. Normal compressibility and flow
on color Doppler imaging.

Superficial Great Saphenous Vein: No evidence of thrombus. Normal
compressibility and flow on color Doppler imaging.

Other Findings:  Edema
IMPRESSION: Sonographic survey of the bilateral lower extremities negative for
DVT.

Edema of the lower extremities.

## 2017-02-07 ENCOUNTER — Encounter: Payer: Self-pay | Admitting: Cardiology

## 2017-02-07 ENCOUNTER — Telehealth: Payer: Self-pay | Admitting: *Deleted

## 2017-02-07 ENCOUNTER — Ambulatory Visit (INDEPENDENT_AMBULATORY_CARE_PROVIDER_SITE_OTHER): Payer: BLUE CROSS/BLUE SHIELD | Admitting: Cardiology

## 2017-02-07 VITALS — BP 122/50 | HR 75 | Ht 64.0 in | Wt 211.0 lb

## 2017-02-07 DIAGNOSIS — R42 Dizziness and giddiness: Secondary | ICD-10-CM | POA: Diagnosis not present

## 2017-02-07 DIAGNOSIS — I5033 Acute on chronic diastolic (congestive) heart failure: Secondary | ICD-10-CM | POA: Diagnosis not present

## 2017-02-07 DIAGNOSIS — I5031 Acute diastolic (congestive) heart failure: Secondary | ICD-10-CM

## 2017-02-07 MED ORDER — MECLIZINE HCL 25 MG PO TABS: 25.0000 mg | ORAL_TABLET | Freq: Three times a day (TID) | ORAL | 3 refills | Status: DC | PRN

## 2017-02-07 MED ORDER — TORSEMIDE 20 MG PO TABS: ORAL_TABLET | ORAL | 3 refills | Status: DC

## 2017-02-07 NOTE — Telephone Encounter (Signed)
-----   Message from Arnoldo Lenis, MD sent at 02/07/2017  3:19 PM EDT ----- Let patient know labs reviewed, I think her kidney function is ok. Would change her diuretics. Stop lasix, start torsemide 40mg  in AM and 20mg  in PM, update [place] on weights mid next week. Repeat BMET/Mg in 2 weeks  J BrancH MD

## 2017-02-07 NOTE — Patient Instructions (Addendum)
Medication Instructions:  START MECLIZINE 25 MG - TAKE EVERY 8 HOURS AS NEEDED FOR DIZZINESS  Labwork: I REQUESTED A COPY OF LABS FROM PCP   Testing/Procedures: NONE  Follow-Up: Your physician recommends that you schedule a follow-up appointment in: 1  MONTH   Any Other Special Instructions Will Be Listed Below (If Applicable).  PLEASE CALL us WITH YOUR WEIGHTS IN 1 WEEK    If you need a refill on your cardiac medications before your next appointment, please call your pharmacy.

## 2017-02-07 NOTE — Telephone Encounter (Signed)
Patient notified. Orders placed. °

## 2017-02-07 NOTE — Progress Notes (Signed)
Clinical Summary Natalie Contreras is a 51 y.o.female seen today for follow up of the following medical problems. This is a focused visit on her history of acute on chronic dyastolic HF.    1. Chronic diastolic HF - patient admitted 10/2016 with volume overload - echo 10/2016 LVEF 55-60%, grade II diastolic dysfunction - diuresed 15 lbs, discharge weight 209 lbs  - home weight have been around 199 lbs. Still with LE edema. Occasional SOB. Net diuresis looks to have slowed.  - limiting salt intake. Avoid NSAIDs.  - lasix increased to 60mg  in AM and 40mg  in PM most recently. Labs remain stable  2. Dizziness - can occur in any position. - mainly occurs at work. Feeling of room spinning around her.  Past Medical History:  Diagnosis Date  . Diabetes mellitus without complication ([place])   . Hypertension      No Known Allergies   Current Outpatient Prescriptions  Medication Sig Dispense Refill  . aspirin EC 81 MG tablet Take 81 mg by mouth daily.    . carvedilol (COREG) 3.125 MG tablet Take 1 tablet by mouth 2 (two) times daily.  4  . furosemide (LASIX) 40 MG tablet Take 60 mg (1 1/2 Tablets) in the Morning and Take 40 mg (1 Tablet) in the Evening Daily 225 tablet 3  . glipiZIDE (GLUCOTROL) 5 MG tablet Take 5 mg by mouth daily before breakfast.    . hydrALAZINE (APRESOLINE) 50 MG tablet Take 1 tablet (50 mg total) by mouth 3 (three) times daily. 270 tablet 3  . lisinopril (PRINIVIL,ZESTRIL) 30 MG tablet Take 1 tablet by mouth daily.  2  . lovastatin (MEVACOR) 40 MG tablet Take 1 tablet by mouth at bedtime.   2  . metFORMIN (GLUCOPHAGE) 1000 MG tablet Take 1,000 mg by mouth daily.   10  . potassium chloride SA (K-DUR,KLOR-CON) 20 MEQ tablet Take 2 tablets (40 mEq total) by mouth daily. 60 tablet 0   No current facility-administered medications for this visit.      Past Surgical History:  Procedure Laterality Date  . CATARACT EXTRACTION W/PHACO Left 02/08/2016   Procedure: CATARACT  EXTRACTION PHACO AND INTRAOCULAR LENS PLACEMENT (IOC);  Surgeon: Tonny Branch, MD;  Location: [facility];  Service: Ophthalmology;  Laterality: Left;  CDE 11.43   . CATARACT EXTRACTION W/PHACO Right 02/26/2016   Procedure: CATARACT EXTRACTION PHACO AND INTRAOCULAR LENS PLACEMENT RIGHT; CDE:  16.90;  Surgeon: Tonny Branch, MD;  Location: [facility];  Service: Ophthalmology;  Laterality: Right;  . CHOLECYSTECTOMY       No Known Allergies    Family History  Problem Relation Age of Onset  . Lung cancer Mother   . Lung cancer Father   . AAA (abdominal aortic aneurysm) Brother      Social History Natalie Contreras reports that she has been smoking Cigarettes.  She started smoking about 32 years ago. She has a 18.75 pack-year smoking history. She uses smokeless tobacco. Natalie Contreras reports that she does not drink alcohol.   Review of Systems CONSTITUTIONAL: No weight loss, fever, chills, weakness or fatigue.  HEENT: Eyes: No visual loss, blurred vision, double vision or yellow sclerae.No hearing loss, sneezing, congestion, runny nose or sore throat.  SKIN: No rash or itching.  CARDIOVASCULAR: no chest pain, no palpitatoins.  RESPIRATORY: No shortness of breath, cough or sputum.  GASTROINTESTINAL: No anorexia, nausea, vomiting or diarrhea. No abdominal pain or blood.  GENITOURINARY: No burning on urination, no polyuria NEUROLOGICAL: +dizziness MUSCULOSKELETAL:  No muscle, back pain, joint pain or stiffness.  LYMPHATICS: No enlarged nodes. No history of splenectomy.  PSYCHIATRIC: No history of depression or anxiety.  ENDOCRINOLOGIC: No reports of sweating, cold or heat intolerance. No polyuria or polydipsia.  [name]   Physical Examination Vitals:   02/07/17 0758  BP: (!) 122/50  Pulse: 75   Filed Weights   02/07/17 0758  Weight: 211 lb (95.7 kg)    Gen: resting comfortably, no acute distress HEENT: no scleral icterus, pupils equal round and reactive, no palptable cervical adenopathy,  CV: RRR, no  jvd Resp: Clear to auscultation bilaterally GI: abdomen is soft, non-tender, non-distended, normal bowel sounds, no hepatosplenomegaly MSK: extremities are warm, no edema.  Skin: warm, no rash Neuro:  no focal deficits Psych: appropriate affect   Diagnostic Studies  10/2016 echo Study Conclusions  - Left ventricle: The cavity size was normal. Wall thickness was   increased in a pattern of mild LVH. Systolic function was normal.   The estimated ejection fraction was in the range of 55% to 60%.   Wall motion was normal; there were no regional wall motion   abnormalities. Features are consistent with a pseudonormal left   ventricular filling pattern, with concomitant abnormal relaxation   and increased filling pressure (grade 2 diastolic dysfunction).   Doppler parameters are consistent with high ventricular filling   pressure. - Aortic valve: Mildly calcified annulus. Trileaflet; mildly   thickened leaflets. Valve area (VTI): 1.65 cm^2. Valve area   (Vmax): 1.79 cm^2. Valve area (Vmean): 2.04 cm^2. - Mitral valve: Mildly calcified annulus. Mildly thickened leaflets   . There was mild regurgitation. - Left atrium: The atrium was moderately dilated. - Right atrium: The atrium was mildly dilated. - Pulmonary arteries: Systolic pressure was moderately increased.   PA peak pressure: 49 mm Hg (S). - Inferior vena cava: The vessel was dilated. The respirophasic   diameter changes were blunted (< 50%), consistent with elevated   central venous pressure. - Technically adequate study.   Assessment and Plan  1. Acute on chronic diastolic HF - remains volume overloaded. Change to torsemide 40mg  in AM and 20mg  in PM - report weights in 1 week  2. Vertigo - start prn meclizine.    F/u 1 month      Arnoldo Lenis, M.D.

## 2017-02-11 ENCOUNTER — Encounter: Payer: Self-pay | Admitting: *Deleted

## 2017-02-17 ENCOUNTER — Other Ambulatory Visit (HOSPITAL_COMMUNITY): Payer: Self-pay | Admitting: Preventative Medicine

## 2017-02-17 ENCOUNTER — Ambulatory Visit (HOSPITAL_COMMUNITY)
Admission: RE | Admit: 2017-02-17 | Discharge: 2017-02-17 | Disposition: A | Discharge status: Home / Self Care | Payer: BLUE CROSS/BLUE SHIELD | Source: Ambulatory Visit | Attending: Preventative Medicine | Admitting: Preventative Medicine

## 2017-02-17 DIAGNOSIS — R9089 Other abnormal findings on diagnostic imaging of central nervous system: Secondary | ICD-10-CM | POA: Diagnosis not present

## 2017-02-17 DIAGNOSIS — R42 Dizziness and giddiness: Secondary | ICD-10-CM | POA: Diagnosis not present

## 2017-02-17 IMAGING — CT CT HEAD W/O CM
3 series · 16 of 46 positions shown, 19 images · non-contrast
Comparison: None.

CLINICAL DATA: Dizziness and giddiness for the past week. No known
injury.

EXAM:
CT HEAD WITHOUT CONTRAST
TECHNIQUE: Contiguous axial images were obtained from the base of the skull
through the vertex without intravenous contrast.

[Series 2: head wo · axial · 0.41mm/px · z∈[+22,+142]mm · 10 of 29 slices shown, 13 images]
[im 3/29  brain]
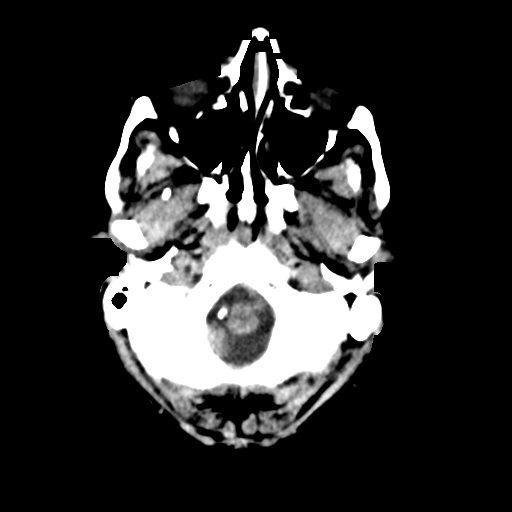
[im 3/29  bone]
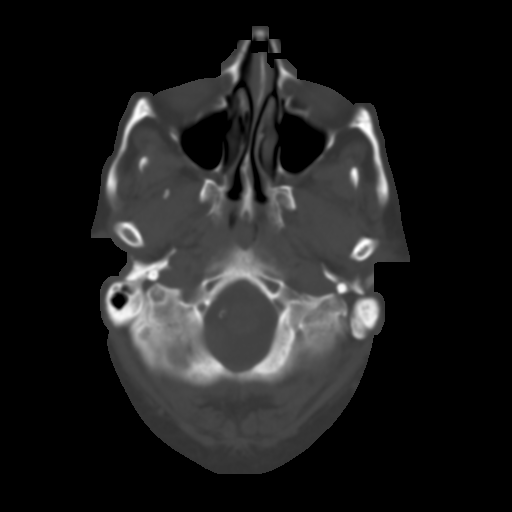
[im 6/29  brain]
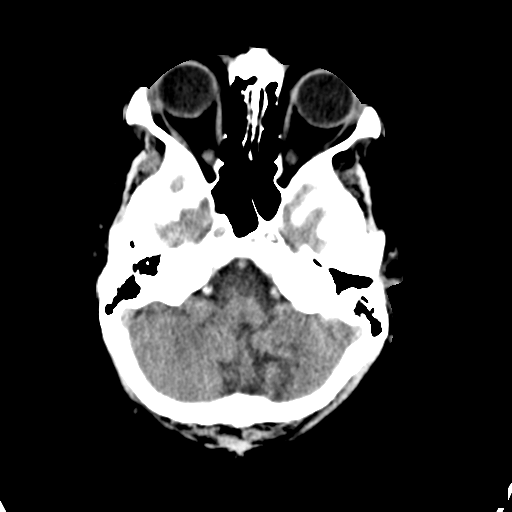
[im 8/29  brain]
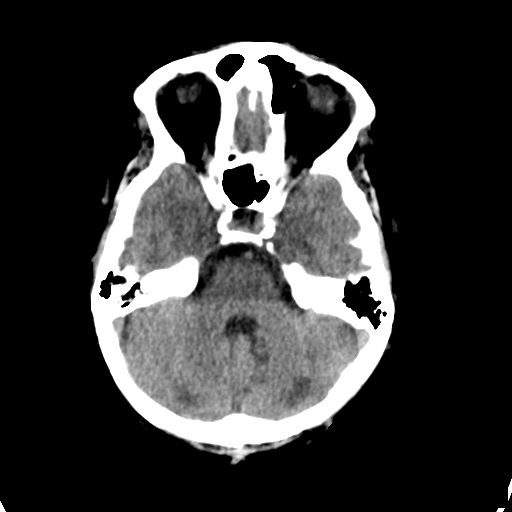
[im 11/29  brain]
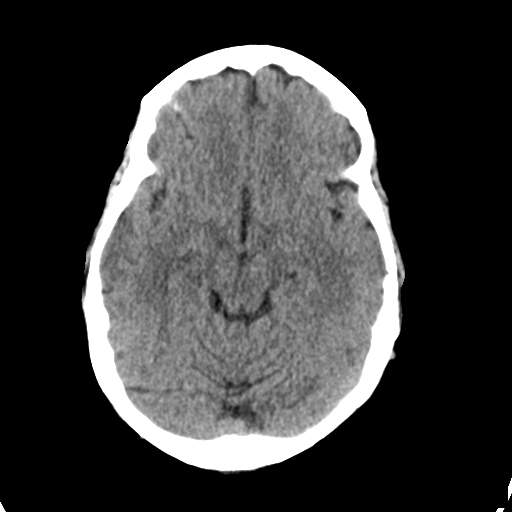
[im 14/29  brain]
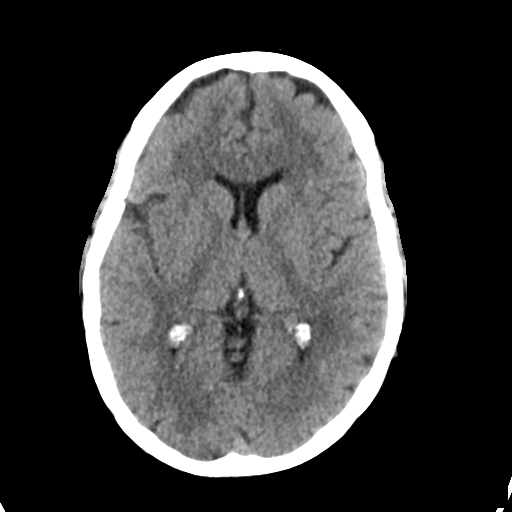
[im 14/29  bone]
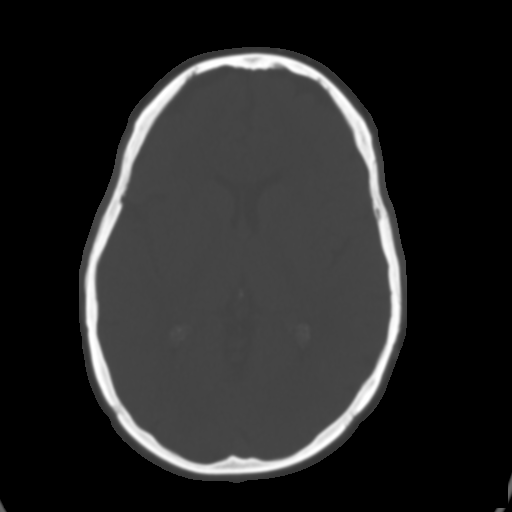
[im 16/29  brain]
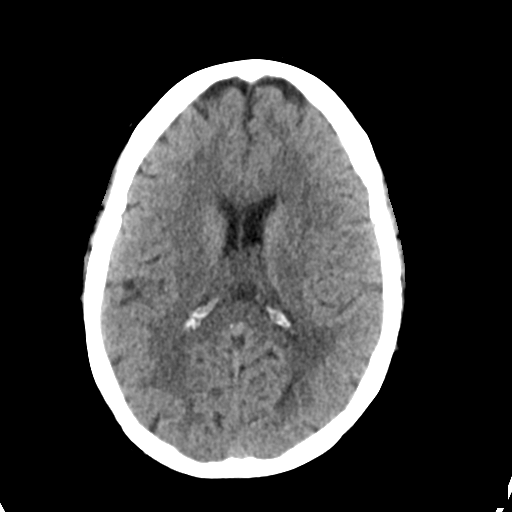
[im 19/29  brain]
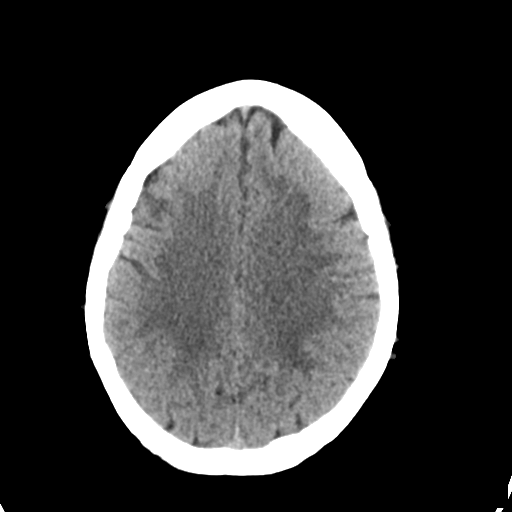
[im 22/29  brain]
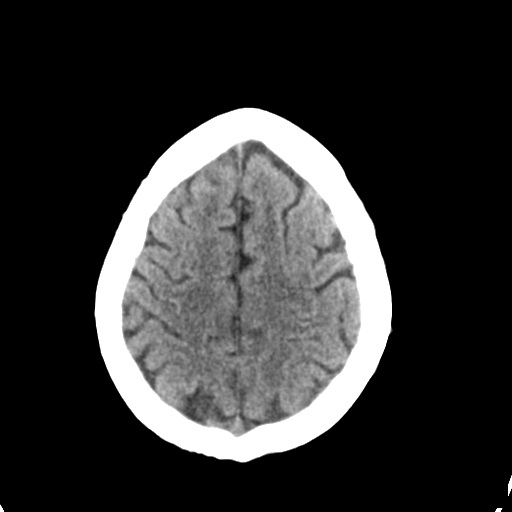
[im 24/29  brain]
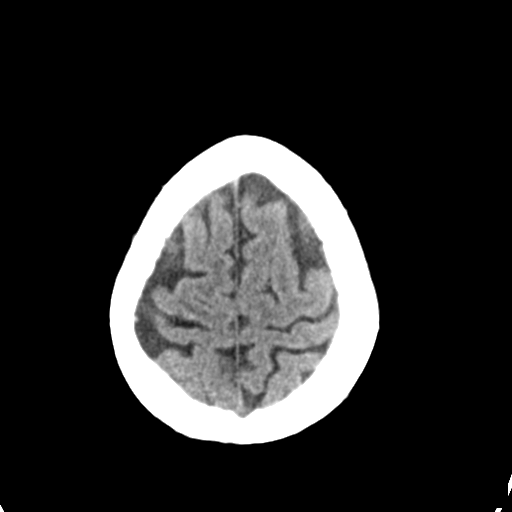
[im 24/29  bone]
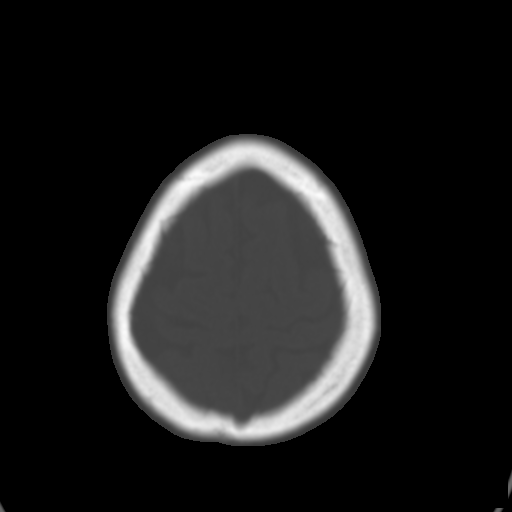
[im 27/29  brain]
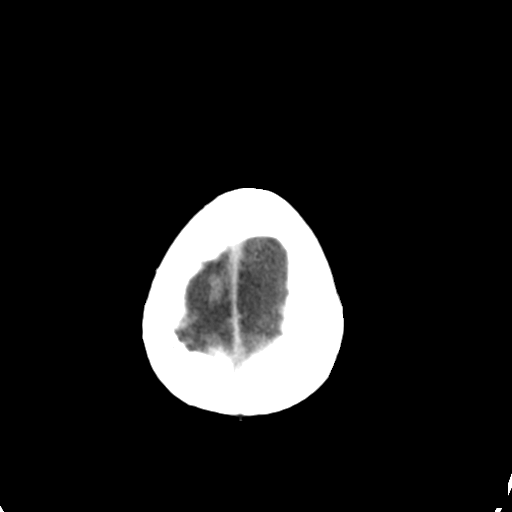

[Series 4: coronal soft tissue · coronal · 0.30mm/px · 3 of 64 slices shown]
[im 22/64  brain]
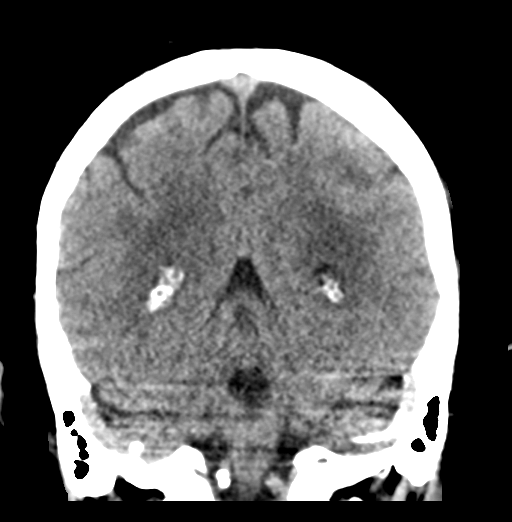
[im 29/64  brain]
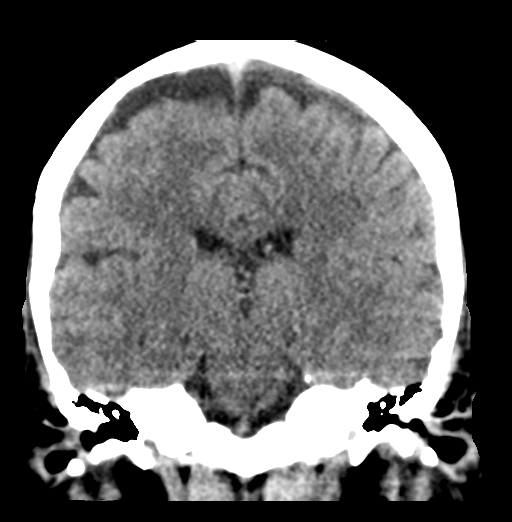
[im 36/64  brain]
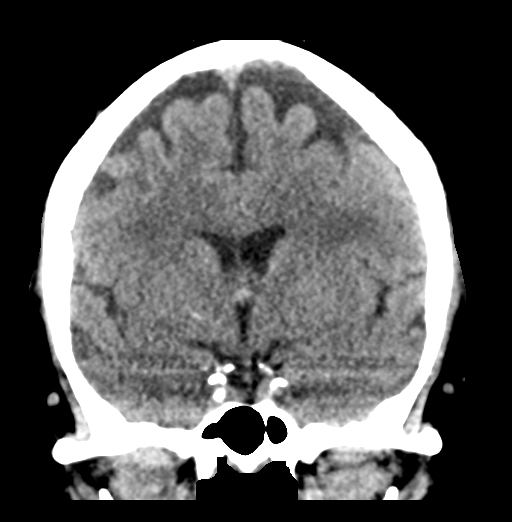

[Series 5: sagittal soft tissue · sagittal · 0.29mm/px · 3 of 48 slices shown]
[im 16/48  brain]
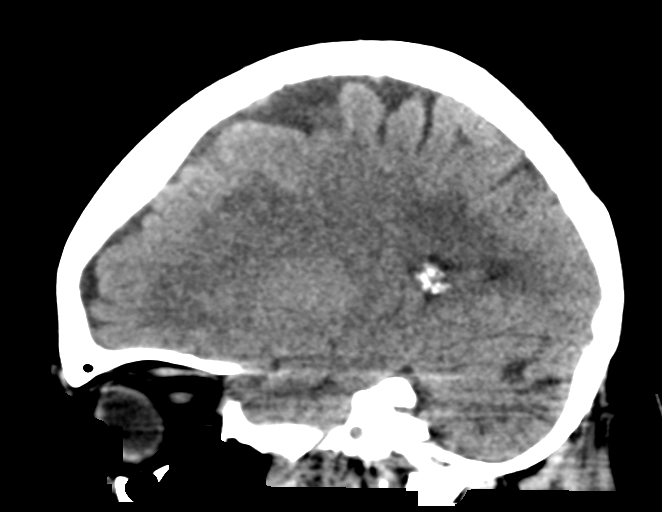
[im 24/48  brain]
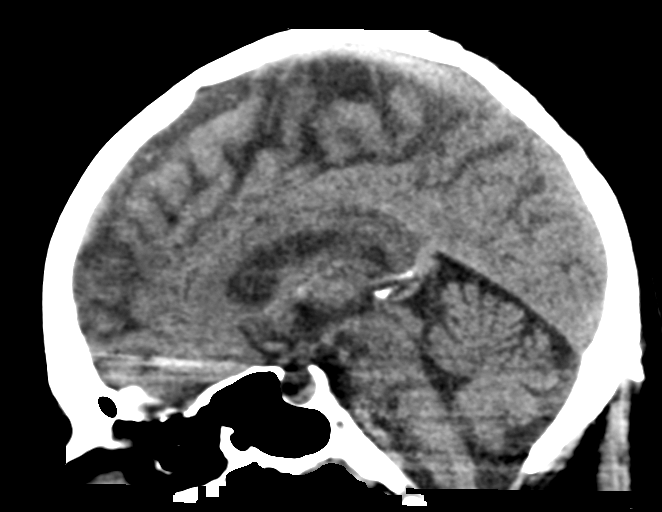
[im 32/48  brain]
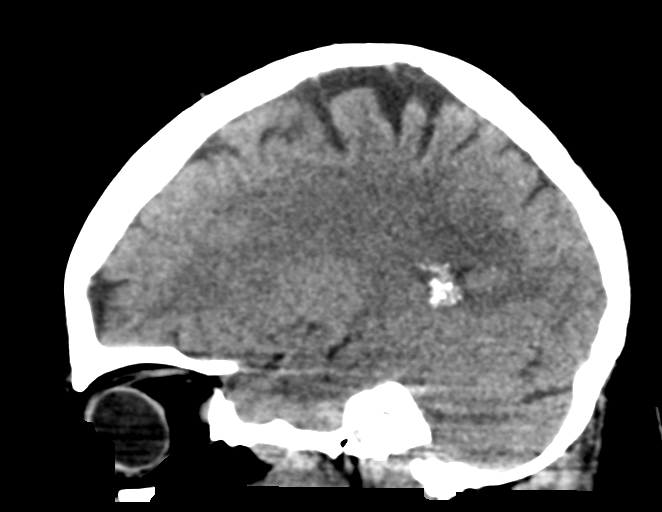

[16 of 46 positions shown; findings below may reference images not displayed]

FINDINGS: Brain: Mild patchy white matter low density in both cerebral
hemispheres. Normal size and position of the ventricles. No
intracranial hemorrhage, mass lesion or CT evidence of acute
infarction.

Vascular: No hyperdense vessel or unexpected calcification.

Skull: Normal. Negative for fracture or focal lesion.

Sinuses/Orbits: Unremarkable.

Other: None.
IMPRESSION: Mild patchy white matter low density in both cerebral hemispheres.
These could represent chronic small vessel ischemic changes or
subacute small vessel white matter ischemic changes. A demyelinating
process such as multiple sclerosis is less likely but not excluded.
If this is a clinical concern, a MRI without and with contrast would
be recommended.

## 2017-02-26 ENCOUNTER — Ambulatory Visit: Payer: BLUE CROSS/BLUE SHIELD | Admitting: Cardiology

## 2017-02-27 ENCOUNTER — Ambulatory Visit (INDEPENDENT_AMBULATORY_CARE_PROVIDER_SITE_OTHER): Payer: BLUE CROSS/BLUE SHIELD | Admitting: Cardiology

## 2017-02-27 ENCOUNTER — Encounter: Payer: Self-pay | Admitting: Cardiology

## 2017-02-27 VITALS — BP 126/60 | HR 70 | Ht 64.0 in | Wt 199.0 lb

## 2017-02-27 DIAGNOSIS — I5033 Acute on chronic diastolic (congestive) heart failure: Secondary | ICD-10-CM

## 2017-02-27 DIAGNOSIS — Z79899 Other long term (current) drug therapy: Secondary | ICD-10-CM | POA: Diagnosis not present

## 2017-02-27 DIAGNOSIS — R55 Syncope and collapse: Secondary | ICD-10-CM

## 2017-02-27 DIAGNOSIS — R930 Abnormal findings on diagnostic imaging of skull and head, not elsewhere classified: Secondary | ICD-10-CM | POA: Diagnosis not present

## 2017-02-27 NOTE — Patient Instructions (Addendum)
Medication Instructions:  DECREASE TORSEMIDE TO 20 MG DAILY    Labwork: TODAY   Testing/Procedures: NONE  Follow-Up: Your physician recommends that you schedule a follow-up appointment in: 6 WEEKS    Any Other Special Instructions Will Be Listed Below (If Applicable).  You have been referred to [facility]. [facility]     If you need a refill on your cardiac medications before your next appointment, please call your pharmacy.

## 2017-02-27 NOTE — Progress Notes (Signed)
Clinical Summary Natalie Contreras is a 51 y.o.female seen today for follow up of the following medical problems.   1. Chronic diastolic HF - patient admitted 10/2016 with volume overload - echo 10/2016 LVEF 55-60%, grade II diastolic dysfunction - diuresed 15 lbs, discharge weight 209 lbs   - last visit we changed her to torsemide 40mg  in AM and 20mg  in PM - reports improved swelling. Weight down 12 lbs based on clinic weights.    2. Syncope - episoe occurred while walking at work - no prodrome. Has had prior dizziness but no other recent syncope   Past Medical History:  Diagnosis Date  . Diabetes mellitus without complication ([place])   . Hypertension      No Known Allergies   Current Outpatient Prescriptions  Medication Sig Dispense Refill  . aspirin EC 81 MG tablet Take 81 mg by mouth daily.    . carvedilol (COREG) 3.125 MG tablet Take 1 tablet by mouth 2 (two) times daily.  4  . glipiZIDE (GLUCOTROL) 5 MG tablet Take 5 mg by mouth daily before breakfast.    . hydrALAZINE (APRESOLINE) 50 MG tablet Take 1 tablet (50 mg total) by mouth 3 (three) times daily. 270 tablet 3  . lisinopril (PRINIVIL,ZESTRIL) 30 MG tablet Take 1 tablet by mouth daily.  2  . lovastatin (MEVACOR) 40 MG tablet Take 1 tablet by mouth at bedtime.   2  . meclizine (ANTIVERT) 25 MG tablet Take 1 tablet (25 mg total) by mouth 3 (three) times daily as needed for dizziness. 30 tablet 3  . metFORMIN (GLUCOPHAGE) 1000 MG tablet Take 1,000 mg by mouth daily.   10  . potassium chloride SA (K-DUR,KLOR-CON) 20 MEQ tablet Take 2 tablets (40 mEq total) by mouth daily. 60 tablet 0  . torsemide (DEMADEX) 20 MG tablet Take 40 mg in the AM and 20 mg in the PM 270 tablet 3   No current facility-administered medications for this visit.      Past Surgical History:  Procedure Laterality Date  . CATARACT EXTRACTION W/PHACO Left 02/08/2016   Procedure: CATARACT EXTRACTION PHACO AND INTRAOCULAR LENS PLACEMENT (IOC);   Surgeon: Tonny , MD;  Location: [facility];  Service: Ophthalmology;  Laterality: Left;  CDE 11.43   . CATARACT EXTRACTION W/PHACO Right 02/26/2016   Procedure: CATARACT EXTRACTION PHACO AND INTRAOCULAR LENS PLACEMENT RIGHT; CDE:  16.90;  Surgeon: Tonny , MD;  Location: [facility];  Service: Ophthalmology;  Laterality: Right;  . CHOLECYSTECTOMY       No Known Allergies    Family History  Problem Relation Age of Onset  . Lung cancer Mother   . Lung cancer Father   . AAA (abdominal aortic aneurysm) Brother      Social History [name] reports that she has been smoking Cigarettes.  She started smoking about 32 years ago. She has a 18.75 pack-year smoking history. She uses smokeless tobacco. [name] reports that she does not drink alcohol.   Review of Systems CONSTITUTIONAL: No weight loss, fever, chills, weakness or fatigue.  HEENT: Eyes: No visual loss, blurred vision, double vision or yellow sclerae.No hearing loss, sneezing, congestion, runny nose or sore throat.  SKIN: No rash or itching.  CARDIOVASCULAR: per hpi RESPIRATORY: No shortness of breath, cough or sputum.  GASTROINTESTINAL: No anorexia, nausea, vomiting or diarrhea. No abdominal pain or blood.  GENITOURINARY: No burning on urination, no polyuria NEUROLOGICAL: per hpi MUSCULOSKELETAL: No muscle, back pain, joint pain or stiffness.  LYMPHATICS:  No enlarged nodes. No history of splenectomy.  PSYCHIATRIC: No history of depression or anxiety.  ENDOCRINOLOGIC: No reports of sweating, cold or heat intolerance. No polyuria or polydipsia.  [name]   Physical Examination There were no vitals filed for this visit. Filed Weights   02/27/17 0813  Weight: 199 lb (90.3 kg)    Gen: resting comfortably, no acute distress HEENT: no scleral icterus, pupils equal round and reactive, no palptable cervical adenopathy,  CV: RRR, 2/6 systolic murmur RUSB, no jvd Resp: Clear to auscultation bilaterally GI: abdomen is soft,  non-tender, non-distended, normal bowel sounds, no hepatosplenomegaly MSK: extremities are warm, 1+ bilateral LE edema Skin: warm, no rash Neuro:  no focal deficits Psych: appropriate affect   Diagnostic Studies 10/2016 echo Study Conclusions  - Left ventricle: The cavity size was normal. Wall thickness was increased in a pattern of mild LVH. Systolic function was normal. The estimated ejection fraction was in the range of 55% to 60%. Wall motion was normal; there were no regional wall motion abnormalities. Features are consistent with a pseudonormal left ventricular filling pattern, with concomitant abnormal relaxation and increased filling pressure (grade 2 diastolic dysfunction). Doppler parameters are consistent with high ventricular filling pressure. - Aortic valve: Mildly calcified annulus. Trileaflet; mildly thickened leaflets. Valve area (VTI): 1.65 cm^2. Valve area (Vmax): 1.79 cm^2. Valve area (Vmean): 2.04 cm^2. - Mitral valve: Mildly calcified annulus. Mildly thickened leaflets . There was mild regurgitation. - Left atrium: The atrium was moderately dilated. - Right atrium: The atrium was mildly dilated. - Pulmonary arteries: Systolic pressure was moderately increased. PA peak pressure: 49 mm Hg (S). - Inferior vena cava: The vessel was dilated. The respirophasic diameter changes were blunted (<50%), consistent with elevated central venous pressure. - Technically adequate study.   12/2016 carotid US IMPRESSION: 1. 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 2. Origin stenoses of the external carotid arteries, left worse than right, may account for the bruits on physical exam. 3.  Antegrade bilateral vertebral arterial flow.   Assessment and Plan  1. Acute on chronic diastolic HF - signifcant improvement on torsemide - she had recent episode of syncope. She is  orthostatic in clinic. I suspect though she still has peripheral edema that she is intravascualrly depleted and this led to her episdoe - lower toresmide to 20mg  daily. Repeat labs  2. Abnormal head CT - CT ordered after recent syncope episode. I suspect her syncope was orthoatic in nature however her CT head does show changes of which the significance is unclear to me. She is quite anxious about this report - I will refer to Dr Arsenio Katz to evaluate findings and see if additional testing is indicated.    3. Syncope - orthostatic in clinic - diuretic changes as described above  F/u 6 weeks.       Arnoldo Lenis, M.D.

## 2017-03-05 LAB — BASIC METABOLIC PANEL
BUN: 41 mg/dL — ABNORMAL HIGH (ref 7–25)
CALCIUM: 8.8 mg/dL (ref 8.6–10.2)
CHLORIDE: 105 mmol/L (ref 98–110)
CO2: 26 mmol/L (ref 20–31)
Creat: 0.83 mg/dL (ref 0.50–1.10)
GLUCOSE: 120 mg/dL — AB (ref 65–99)
Potassium: 4.3 mmol/L (ref 3.5–5.3)
SODIUM: 139 mmol/L (ref 135–146)

## 2017-03-05 LAB — MAGNESIUM: Magnesium: 2.2 mg/dL (ref 1.5–2.5)

## 2017-03-12 ENCOUNTER — Ambulatory Visit: Payer: BLUE CROSS/BLUE SHIELD | Admitting: Physician Assistant

## 2017-04-07 ENCOUNTER — Emergency Department (HOSPITAL_COMMUNITY): Payer: BLUE CROSS/BLUE SHIELD

## 2017-04-07 ENCOUNTER — Inpatient Hospital Stay (HOSPITAL_COMMUNITY): Payer: BLUE CROSS/BLUE SHIELD

## 2017-04-07 ENCOUNTER — Inpatient Hospital Stay (HOSPITAL_COMMUNITY)
Admission: EM | Admit: 2017-04-07 | Discharge: 2017-04-15 | DRG: 913 | Disposition: A | Discharge status: Home / Self Care | Payer: BLUE CROSS/BLUE SHIELD | Attending: Internal Medicine | Admitting: Internal Medicine

## 2017-04-07 ENCOUNTER — Encounter (HOSPITAL_COMMUNITY): Payer: Self-pay

## 2017-04-07 DIAGNOSIS — E1140 Type 2 diabetes mellitus with diabetic neuropathy, unspecified: Secondary | ICD-10-CM | POA: Diagnosis present

## 2017-04-07 DIAGNOSIS — Z79899 Other long term (current) drug therapy: Secondary | ICD-10-CM

## 2017-04-07 DIAGNOSIS — E11649 Type 2 diabetes mellitus with hypoglycemia without coma: Secondary | ICD-10-CM | POA: Diagnosis present

## 2017-04-07 DIAGNOSIS — I110 Hypertensive heart disease with heart failure: Secondary | ICD-10-CM | POA: Diagnosis present

## 2017-04-07 DIAGNOSIS — Z9841 Cataract extraction status, right eye: Secondary | ICD-10-CM | POA: Diagnosis not present

## 2017-04-07 DIAGNOSIS — L02619 Cutaneous abscess of unspecified foot: Secondary | ICD-10-CM | POA: Diagnosis not present

## 2017-04-07 DIAGNOSIS — S91332D Puncture wound without foreign body, left foot, subsequent encounter: Secondary | ICD-10-CM | POA: Diagnosis not present

## 2017-04-07 DIAGNOSIS — I2510 Atherosclerotic heart disease of native coronary artery without angina pectoris: Secondary | ICD-10-CM | POA: Diagnosis present

## 2017-04-07 DIAGNOSIS — L02612 Cutaneous abscess of left foot: Secondary | ICD-10-CM | POA: Diagnosis present

## 2017-04-07 DIAGNOSIS — W458XXA Other foreign body or object entering through skin, initial encounter: Secondary | ICD-10-CM | POA: Diagnosis present

## 2017-04-07 DIAGNOSIS — E1151 Type 2 diabetes mellitus with diabetic peripheral angiopathy without gangrene: Secondary | ICD-10-CM | POA: Diagnosis present

## 2017-04-07 DIAGNOSIS — N289 Disorder of kidney and ureter, unspecified: Secondary | ICD-10-CM | POA: Diagnosis not present

## 2017-04-07 DIAGNOSIS — I5032 Chronic diastolic (congestive) heart failure: Secondary | ICD-10-CM | POA: Diagnosis present

## 2017-04-07 DIAGNOSIS — I10 Essential (primary) hypertension: Secondary | ICD-10-CM

## 2017-04-07 DIAGNOSIS — Z9842 Cataract extraction status, left eye: Secondary | ICD-10-CM

## 2017-04-07 DIAGNOSIS — E11628 Type 2 diabetes mellitus with other skin complications: Secondary | ICD-10-CM | POA: Diagnosis present

## 2017-04-07 DIAGNOSIS — D62 Acute posthemorrhagic anemia: Secondary | ICD-10-CM | POA: Diagnosis present

## 2017-04-07 DIAGNOSIS — E785 Hyperlipidemia, unspecified: Secondary | ICD-10-CM | POA: Diagnosis present

## 2017-04-07 DIAGNOSIS — F17210 Nicotine dependence, cigarettes, uncomplicated: Secondary | ICD-10-CM | POA: Diagnosis present

## 2017-04-07 DIAGNOSIS — S90852A Superficial foreign body, left foot, initial encounter: Secondary | ICD-10-CM

## 2017-04-07 DIAGNOSIS — Z7984 Long term (current) use of oral hypoglycemic drugs: Secondary | ICD-10-CM

## 2017-04-07 DIAGNOSIS — W25XXXA Contact with sharp glass, initial encounter: Secondary | ICD-10-CM | POA: Diagnosis present

## 2017-04-07 DIAGNOSIS — L03119 Cellulitis of unspecified part of limb: Secondary | ICD-10-CM | POA: Diagnosis not present

## 2017-04-07 DIAGNOSIS — L97509 Non-pressure chronic ulcer of other part of unspecified foot with unspecified severity: Secondary | ICD-10-CM | POA: Diagnosis not present

## 2017-04-07 DIAGNOSIS — E0840 Diabetes mellitus due to underlying condition with diabetic neuropathy, unspecified: Secondary | ICD-10-CM | POA: Diagnosis not present

## 2017-04-07 DIAGNOSIS — E11621 Type 2 diabetes mellitus with foot ulcer: Secondary | ICD-10-CM | POA: Diagnosis not present

## 2017-04-07 DIAGNOSIS — L089 Local infection of the skin and subcutaneous tissue, unspecified: Secondary | ICD-10-CM | POA: Diagnosis present

## 2017-04-07 DIAGNOSIS — I739 Peripheral vascular disease, unspecified: Secondary | ICD-10-CM | POA: Diagnosis not present

## 2017-04-07 DIAGNOSIS — D509 Iron deficiency anemia, unspecified: Secondary | ICD-10-CM | POA: Diagnosis present

## 2017-04-07 DIAGNOSIS — N179 Acute kidney failure, unspecified: Secondary | ICD-10-CM | POA: Diagnosis not present

## 2017-04-07 DIAGNOSIS — I5033 Acute on chronic diastolic (congestive) heart failure: Secondary | ICD-10-CM | POA: Diagnosis present

## 2017-04-07 DIAGNOSIS — Z961 Presence of intraocular lens: Secondary | ICD-10-CM | POA: Diagnosis present

## 2017-04-07 DIAGNOSIS — R52 Pain, unspecified: Secondary | ICD-10-CM

## 2017-04-07 DIAGNOSIS — L03116 Cellulitis of left lower limb: Secondary | ICD-10-CM | POA: Diagnosis present

## 2017-04-07 DIAGNOSIS — S91342A Puncture wound with foreign body, left foot, initial encounter: Secondary | ICD-10-CM | POA: Diagnosis not present

## 2017-04-07 DIAGNOSIS — S91332A Puncture wound without foreign body, left foot, initial encounter: Secondary | ICD-10-CM

## 2017-04-07 DIAGNOSIS — Z7982 Long term (current) use of aspirin: Secondary | ICD-10-CM

## 2017-04-07 DIAGNOSIS — Y990 Civilian activity done for income or pay: Secondary | ICD-10-CM

## 2017-04-07 DIAGNOSIS — N170 Acute kidney failure with tubular necrosis: Secondary | ICD-10-CM | POA: Diagnosis present

## 2017-04-07 DIAGNOSIS — S90852D Superficial foreign body, left foot, subsequent encounter: Secondary | ICD-10-CM | POA: Diagnosis not present

## 2017-04-07 DIAGNOSIS — E119 Type 2 diabetes mellitus without complications: Secondary | ICD-10-CM

## 2017-04-07 DIAGNOSIS — Z419 Encounter for procedure for purposes other than remedying health state, unspecified: Secondary | ICD-10-CM

## 2017-04-07 HISTORY — DX: Acute diastolic (congestive) heart failure: I50.31

## 2017-04-07 HISTORY — DX: Cardiac murmur, unspecified: R01.1

## 2017-04-07 HISTORY — DX: Peripheral vascular disease, unspecified: I73.9

## 2017-04-07 HISTORY — DX: Occlusion and stenosis of bilateral carotid arteries: I65.23

## 2017-04-07 LAB — BASIC METABOLIC PANEL
Anion gap: 11 (ref 5–15)
BUN: 27 mg/dL — AB (ref 6–20)
CHLORIDE: 101 mmol/L (ref 101–111)
CO2: 28 mmol/L (ref 22–32)
CREATININE: 0.82 mg/dL (ref 0.44–1.00)
Calcium: 8.9 mg/dL (ref 8.9–10.3)
GFR calc Af Amer: >60 mL/min (ref >60)
GFR calc non Af Amer: >60 mL/min (ref >60)
Glucose, Bld: 118 mg/dL — ABNORMAL HIGH (ref 65–99)
POTASSIUM: 3.4 mmol/L — AB (ref 3.5–5.1)
Sodium: 140 mmol/L (ref 135–145)

## 2017-04-07 LAB — CBC WITH DIFFERENTIAL/PLATELET
BASOS ABS: 0 10*3/uL (ref 0.0–0.1)
BASOS PCT: 0 %
Eosinophils Absolute: 0.1 10*3/uL (ref 0.0–0.7)
Eosinophils Relative: 1 %
HEMATOCRIT: 33.5 % — AB (ref 36.0–46.0)
HEMOGLOBIN: 10.9 g/dL — AB (ref 12.0–15.0)
LYMPHS PCT: 17 %
Lymphs Abs: 1.9 10*3/uL (ref 0.7–4.0)
MCH: 29.1 pg (ref 26.0–34.0)
MCHC: 32.5 g/dL (ref 30.0–36.0)
MCV: 89.6 fL (ref 78.0–100.0)
MONO ABS: 0.8 10*3/uL (ref 0.1–1.0)
Monocytes Relative: 7 %
NEUTROS ABS: 8.2 10*3/uL — AB (ref 1.7–7.7)
NEUTROS PCT: 75 %
Platelets: 316 10*3/uL (ref 150–400)
RBC: 3.74 MIL/uL — ABNORMAL LOW (ref 3.87–5.11)
RDW: 13.9 % (ref 11.5–15.5)
WBC: 11.1 10*3/uL — ABNORMAL HIGH (ref 4.0–10.5)

## 2017-04-07 LAB — LACTIC ACID, PLASMA
LACTIC ACID, VENOUS: 0.7 mmol/L (ref 0.5–1.9)
Lactic Acid, Venous: 1 mmol/L (ref 0.5–1.9)

## 2017-04-07 LAB — URINALYSIS, ROUTINE W REFLEX MICROSCOPIC
Bilirubin Urine: NEGATIVE
GLUCOSE, UA: NEGATIVE mg/dL
Hgb urine dipstick: NEGATIVE
KETONES UR: NEGATIVE mg/dL
LEUKOCYTES UA: NEGATIVE
NITRITE: NEGATIVE
PH: 5 (ref 5.0–8.0)
Protein, ur: NEGATIVE mg/dL
SPECIFIC GRAVITY, URINE: 1.006 (ref 1.005–1.030)

## 2017-04-07 IMAGING — DX DG CHEST 2V
2 series · 2 of 2 positions shown · non-contrast
Comparison: [DATE]

CLINICAL DATA: Congestion and rales on exam.

EXAM:
CHEST  2 VIEW

[chest lat]
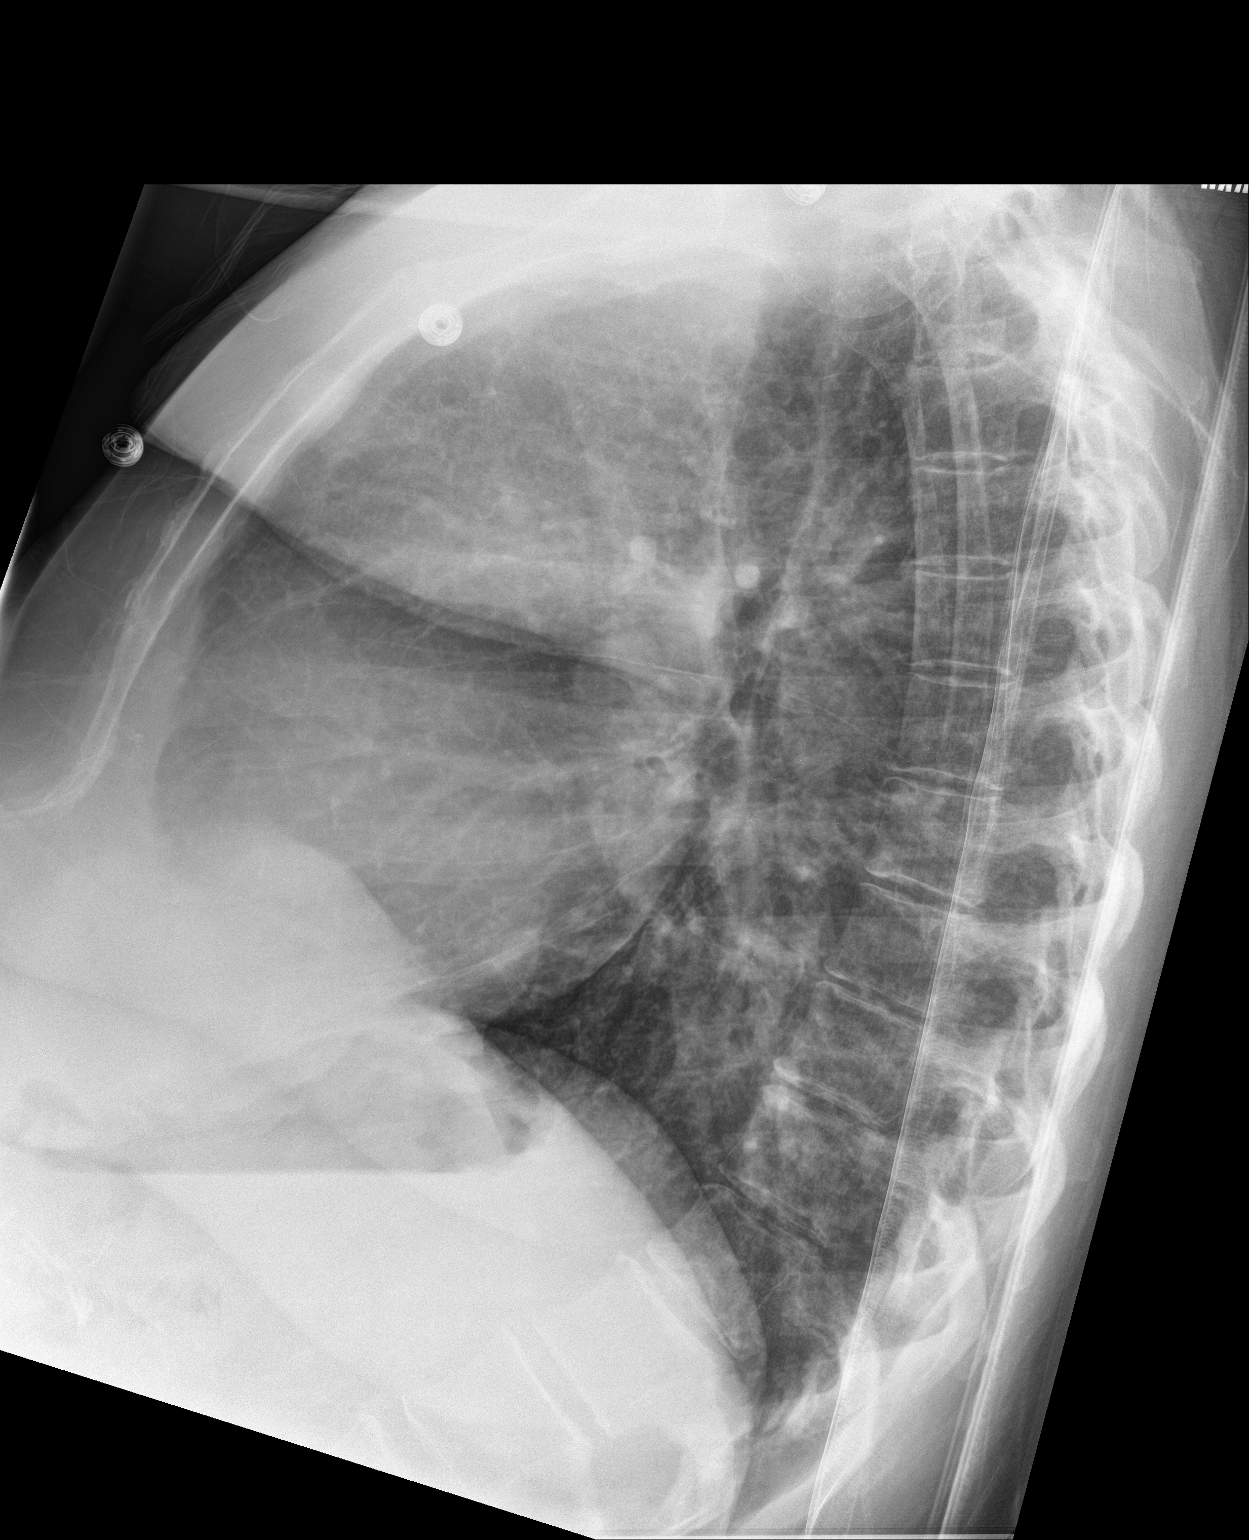

[chest ap]
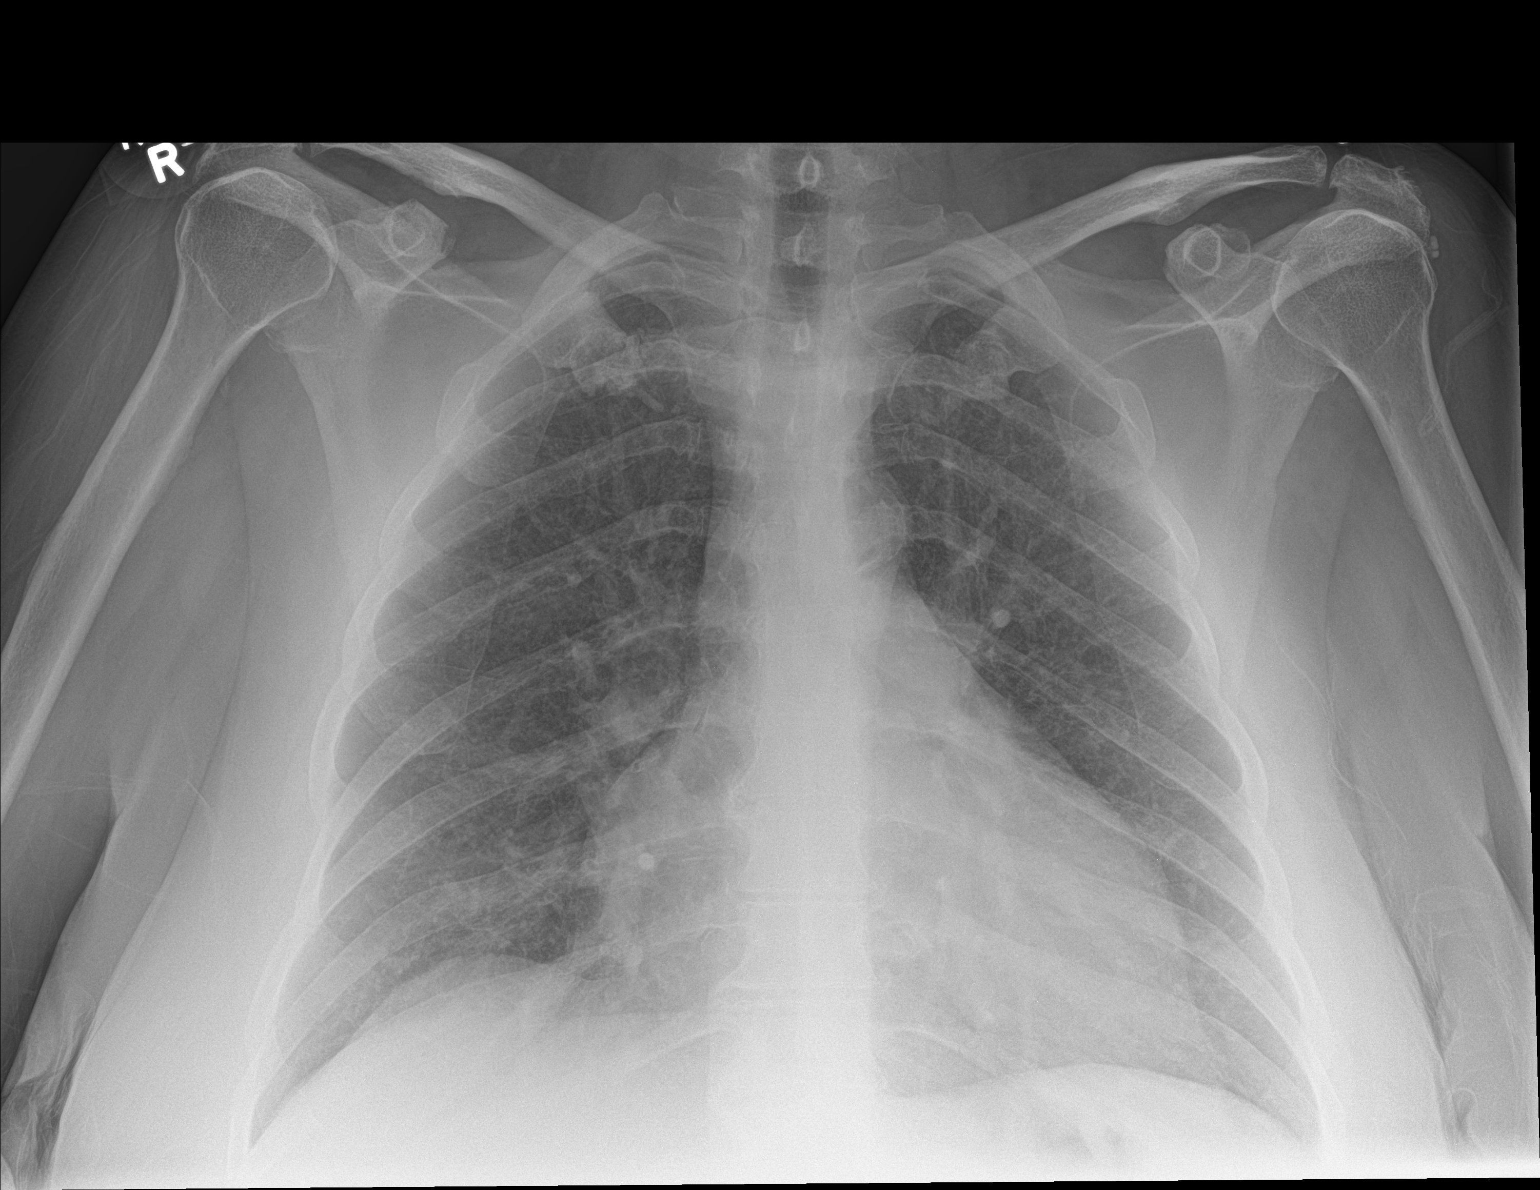

[2 of 2 positions shown; findings below may reference images not displayed]

FINDINGS: The heart is mildly enlarged. Mild pulmonary vascular congestion is
present without frank edema. There are no significant effusions. No
focal airspace disease is present. The visualized soft tissues and
bony thorax are unremarkable.
IMPRESSION: 1. Mild cardiac enlargement and pulmonary vascular congestion. This
may represent early congestive heart failure.

## 2017-04-07 IMAGING — DX DG FOOT COMPLETE 3+V*L*
3 series · 3 of 3 positions shown · non-contrast
Comparison: None.

CLINICAL DATA: Puncture wound of the bottom of the foot.

EXAM:
LEFT FOOT - COMPLETE 3+ VIEW

[foot ap]
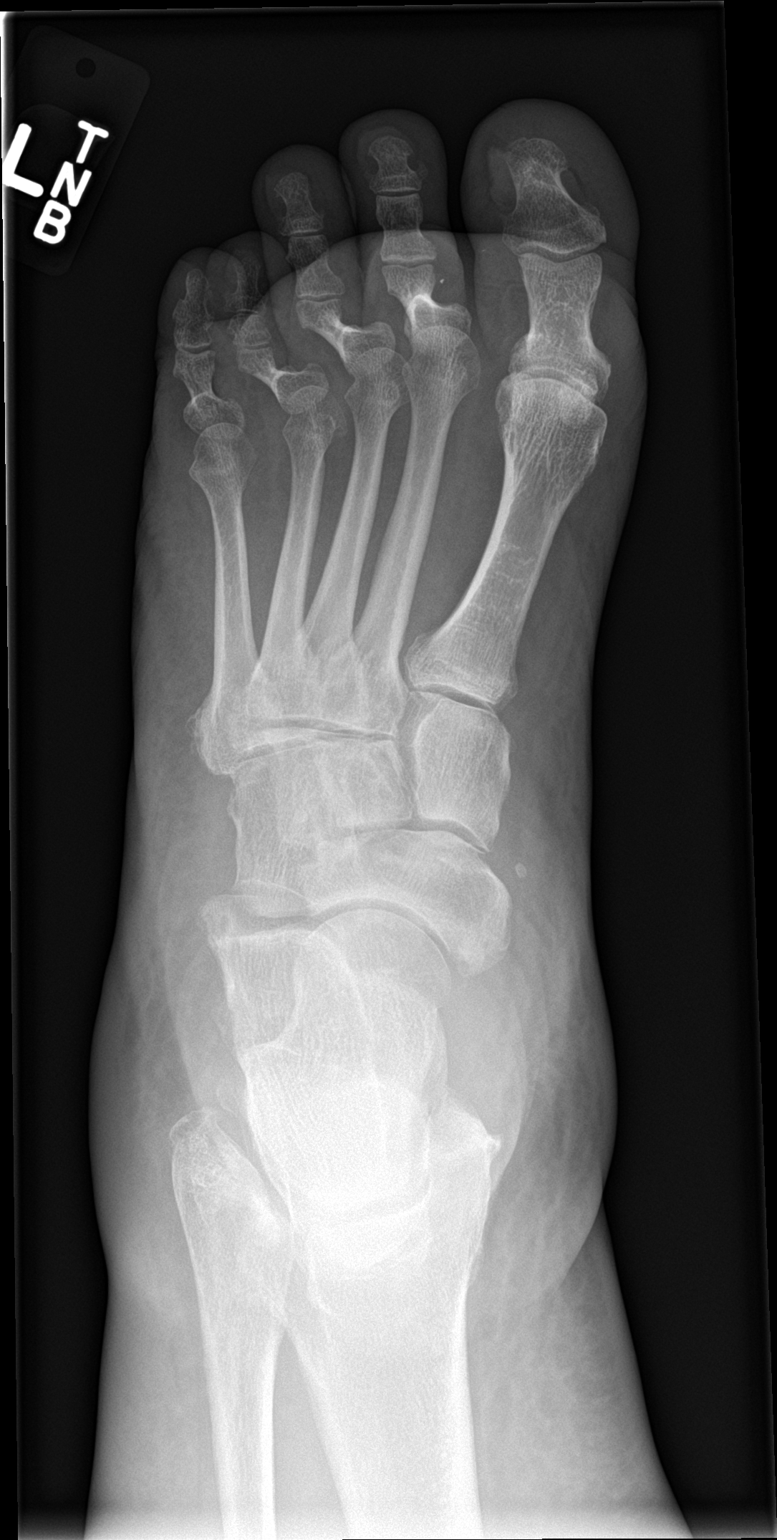

[foot obl]
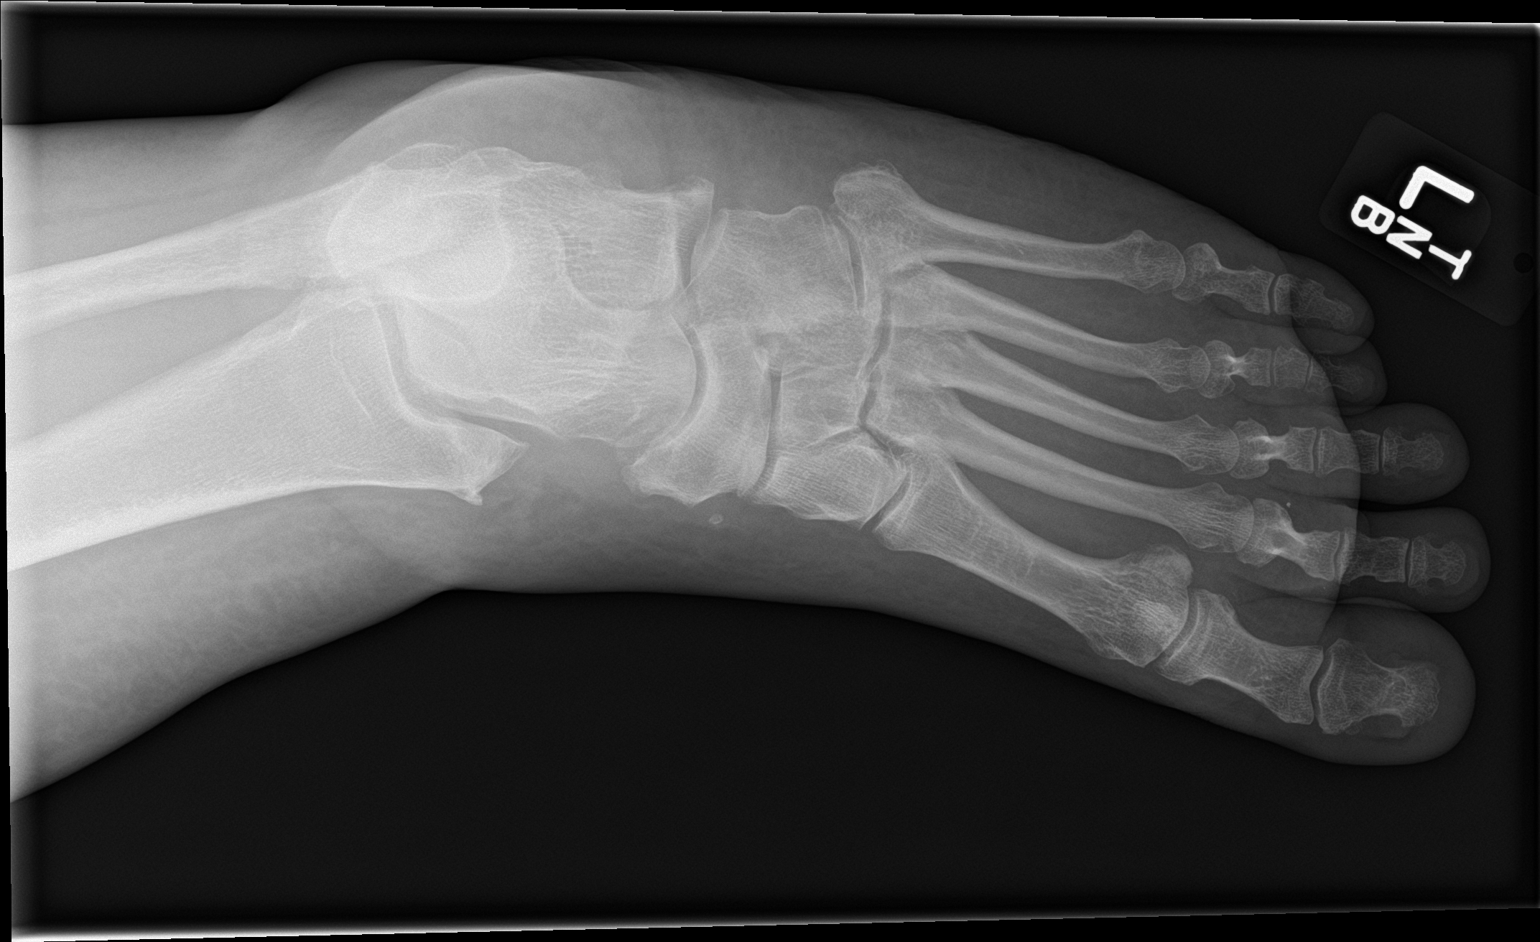

[foot lat]
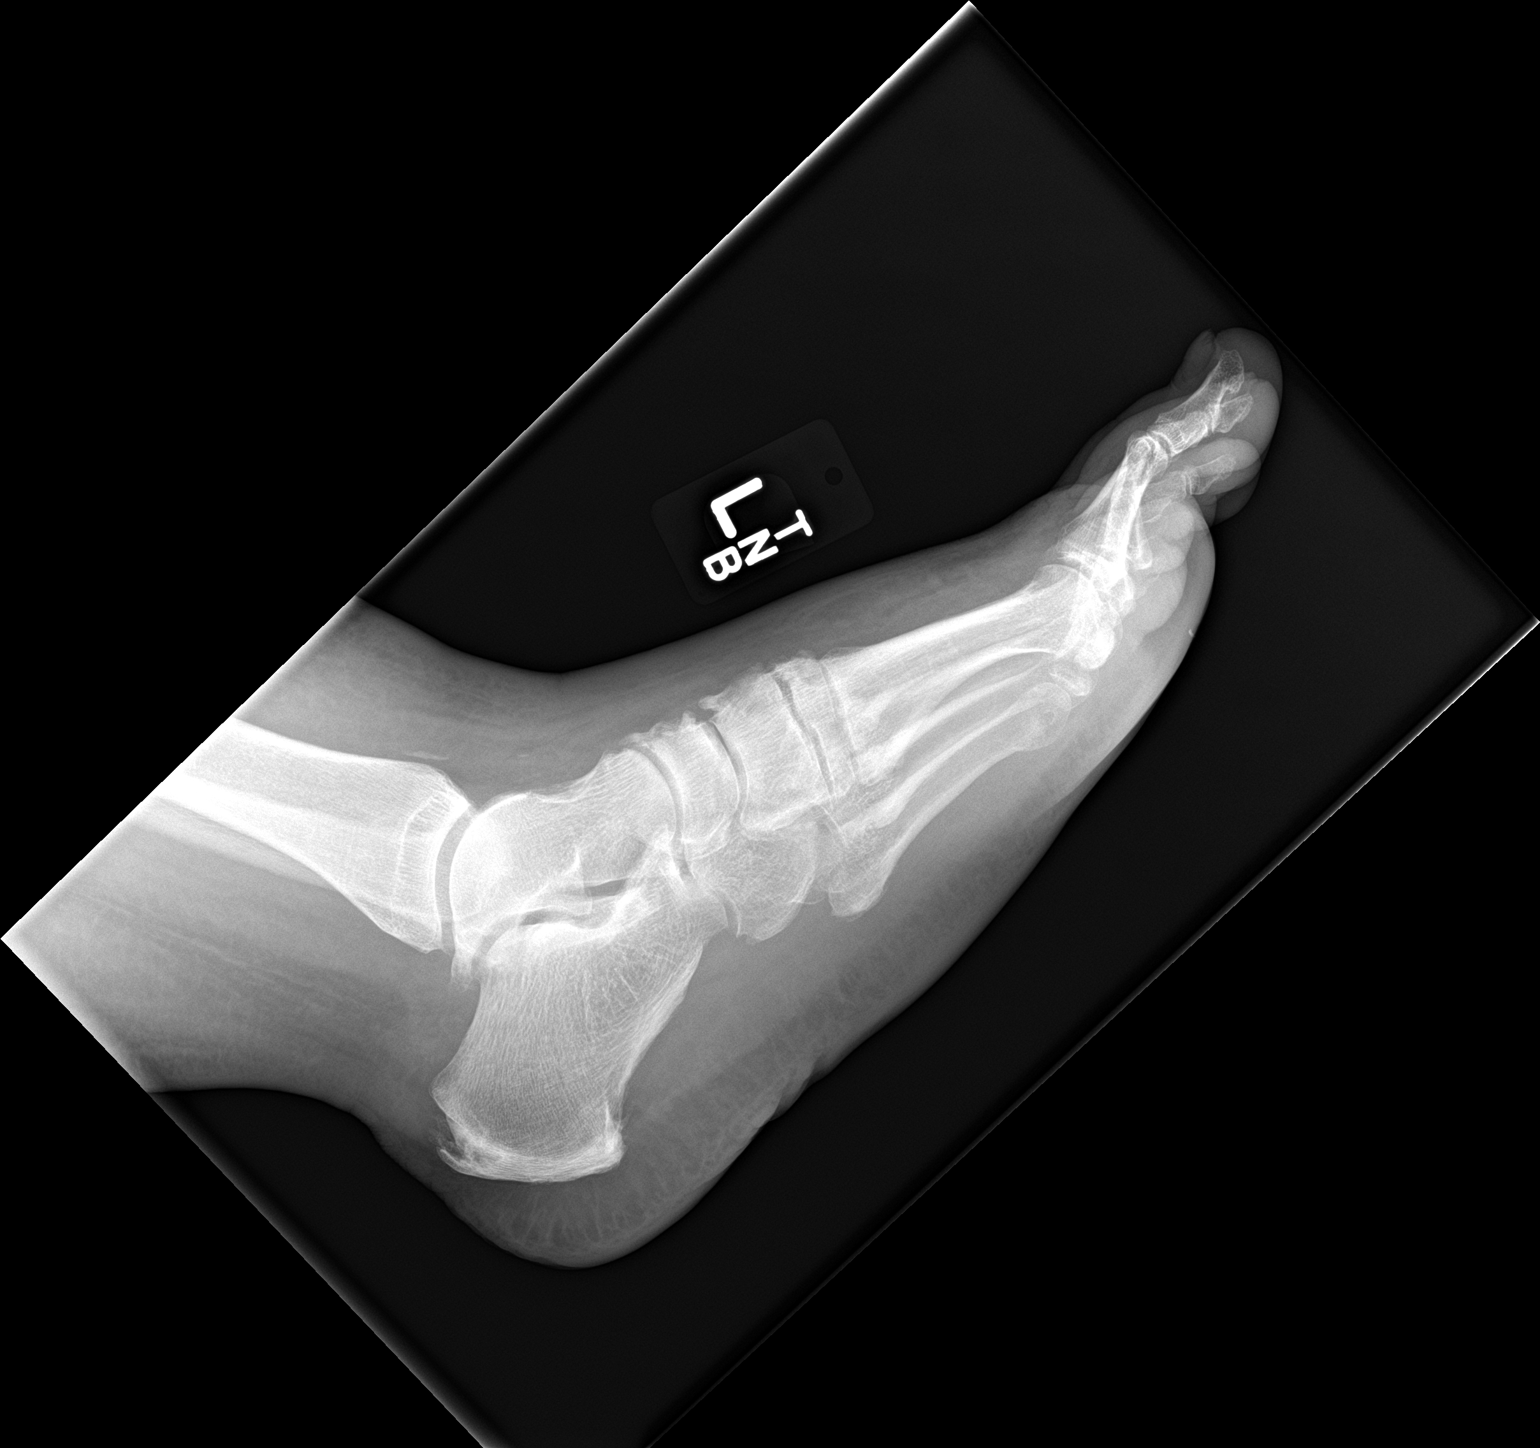

[3 of 3 positions shown; findings below may reference images not displayed]

FINDINGS: There is no evidence of fracture or dislocation. Mild osteoarthritis
of the first MTP joint, first TMT joint and talonavicular joint.
Enthesopathic changes of the Achilles tendon insertion. Tiny
radiopaque foreign body in the soft tissues of the lateral base of
the second toe along the plantar aspect. No soft tissue emphysema.
IMPRESSION: Tiny radiopaque foreign body in the soft tissues of the lateral base
of the second toe along the plantar aspect. No evidence of
osteomyelitis.

## 2017-04-07 IMAGING — CR DG FOOT COMPLETE 3+V*L*
1 series · 3 of 3 positions shown · non-contrast
Comparison: Film from earlier in the same day

CLINICAL DATA: Follow-up wound debridement, check for retained
foreign body

EXAM:
LEFT FOOT - COMPLETE 3+ VIEW

[Series 1: ap · 0.17mm/px · 3 of 3 slices shown]
[im 1/3]
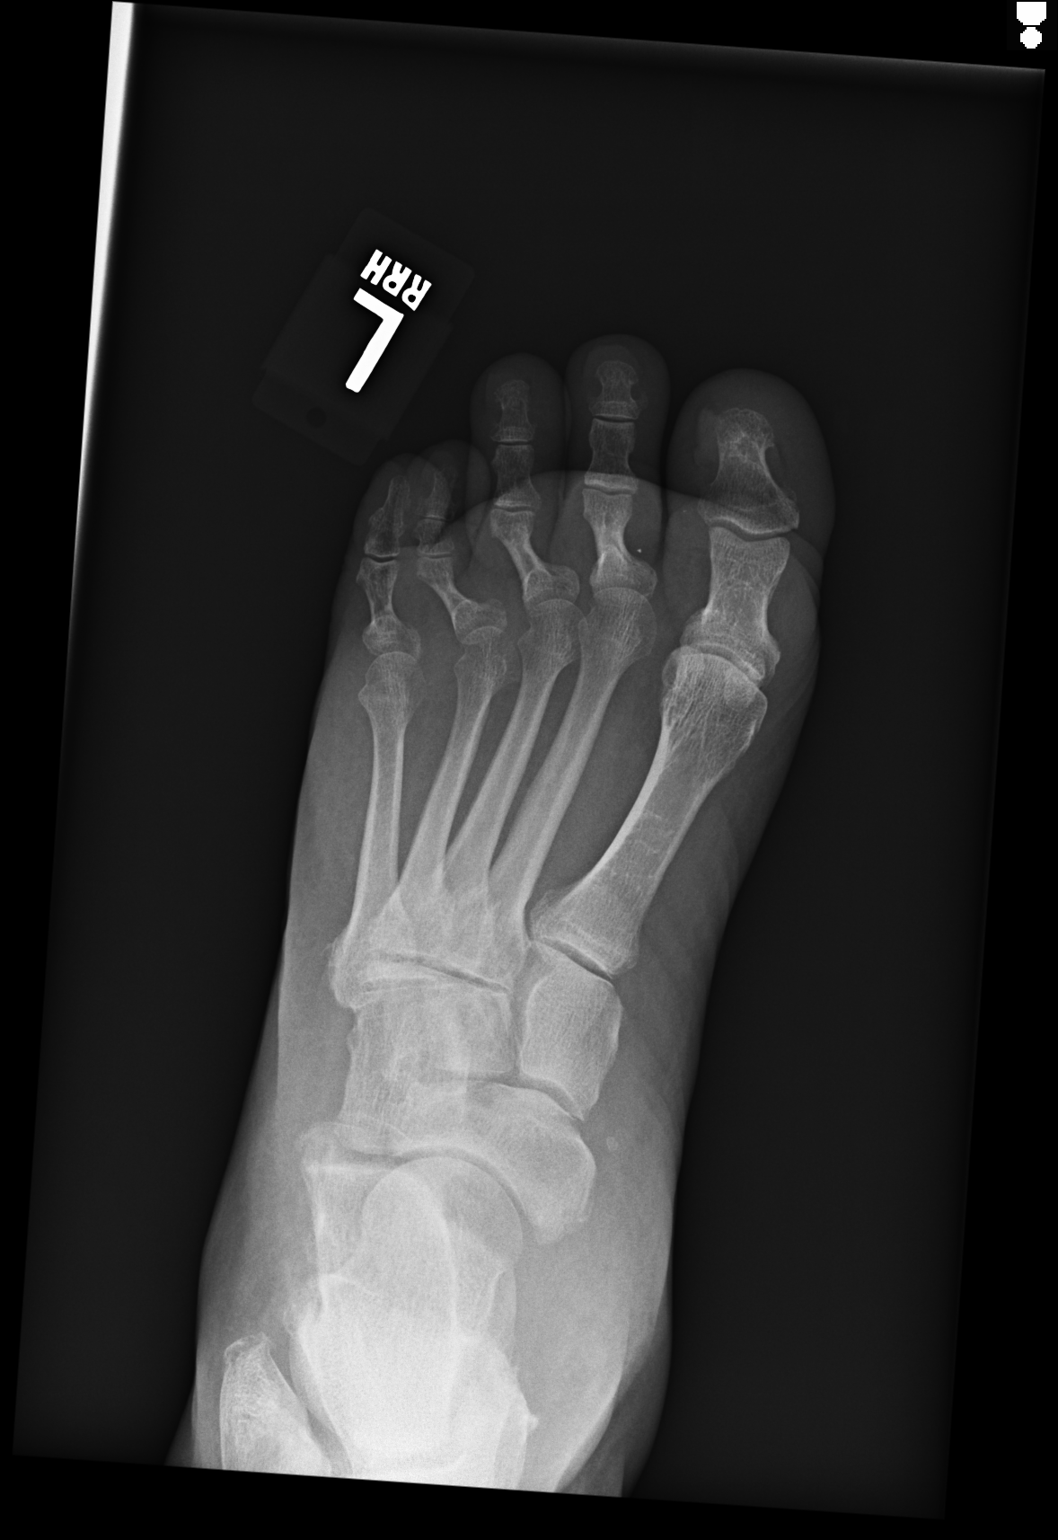
[im 2/3]
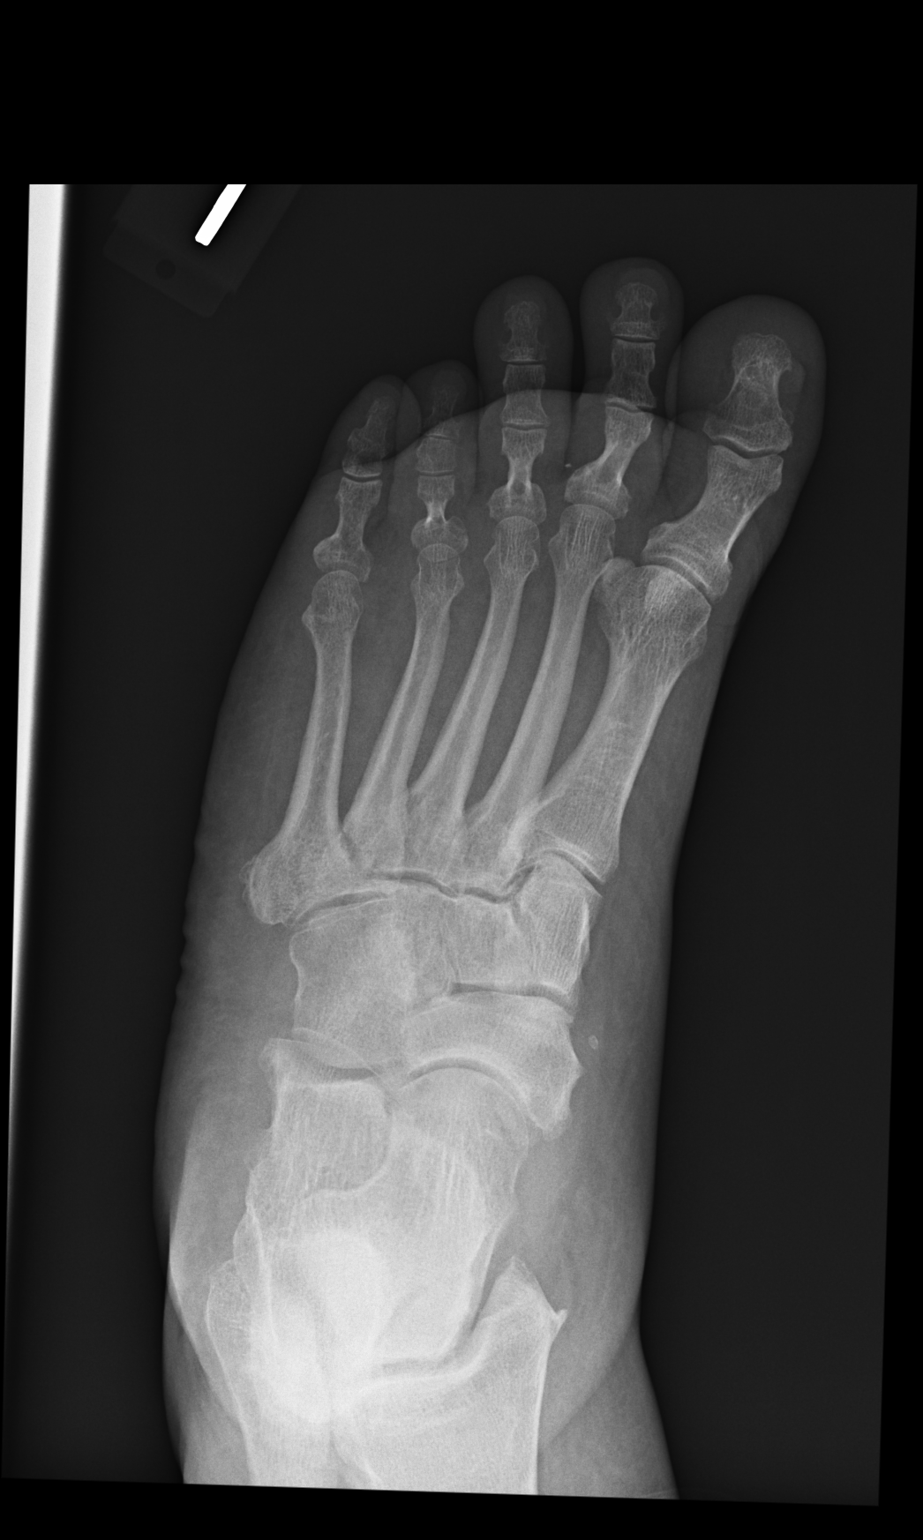
[im 3/3]
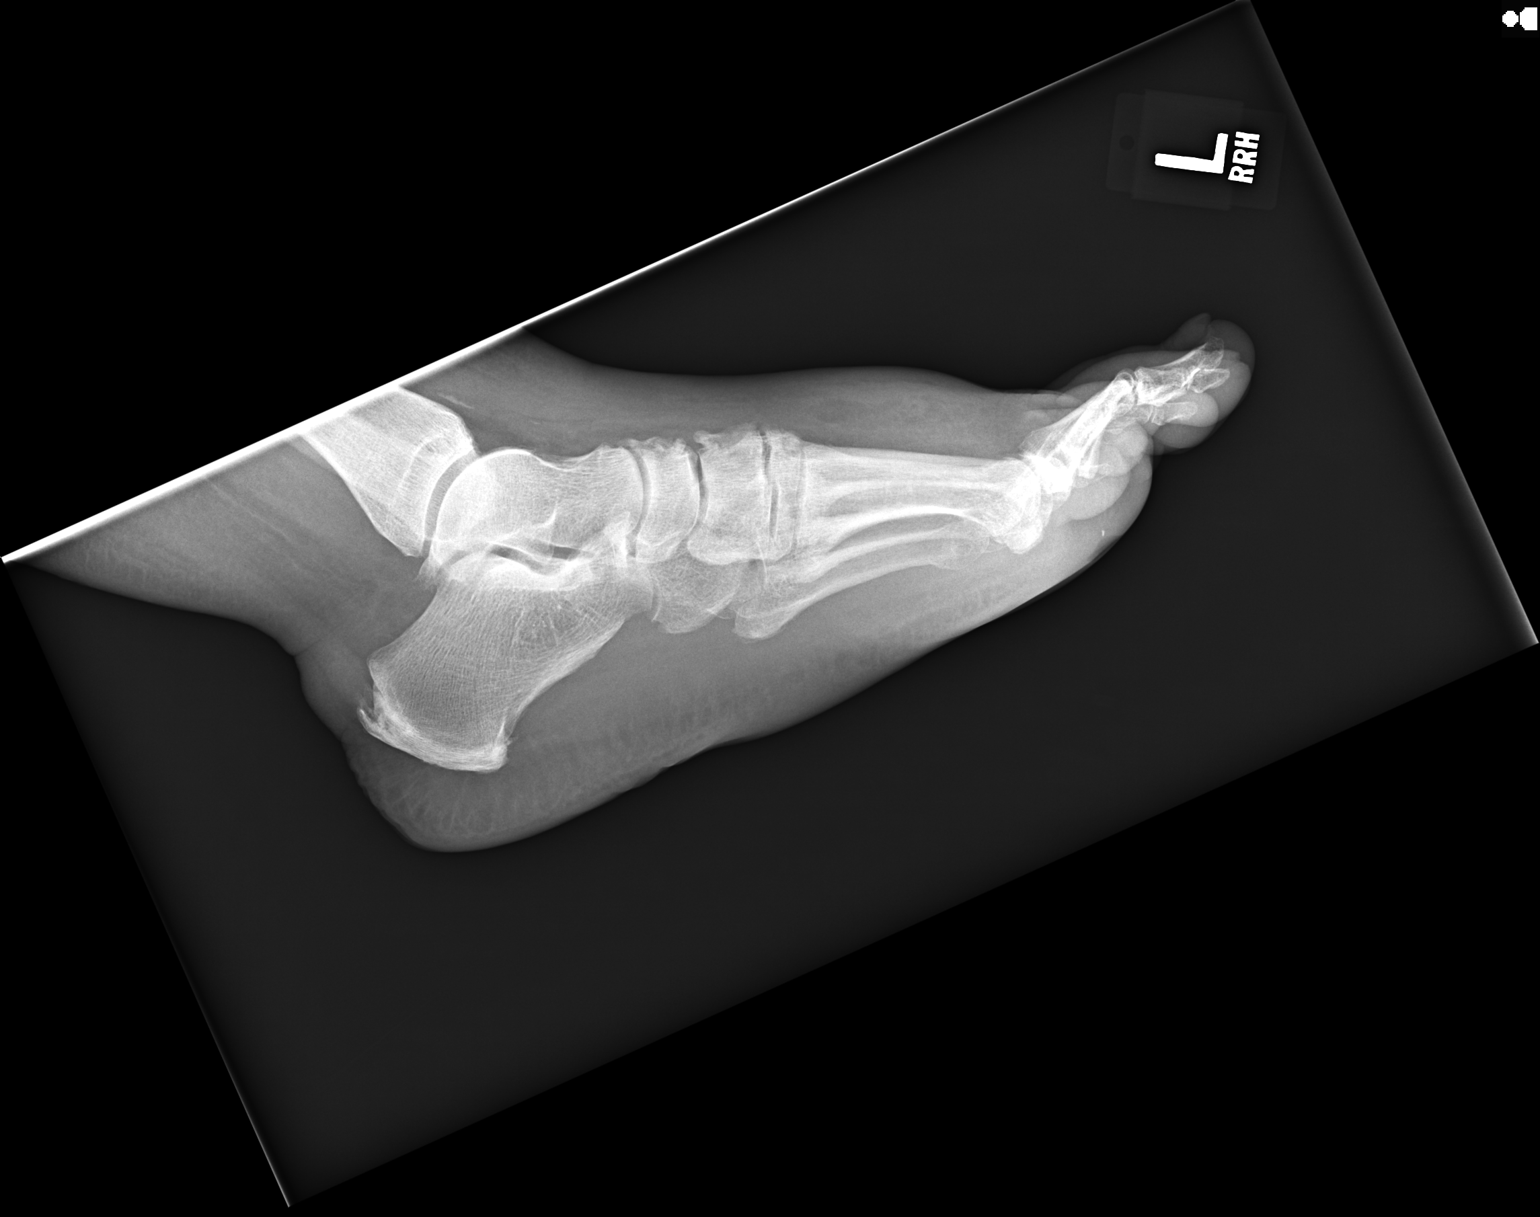

[3 of 3 positions shown; findings below may reference images not displayed]

FINDINGS: There is again noted a a metallic density identified within the soft
tissues of the plantar aspect of the foot between the second and
third proximal phalanges. No acute fracture or dislocation is noted.
A soft tissue defect consistent with the recent treatment is noted.
The remainder the exam is stable from the prior study.
IMPRESSION: Stable appearing Retained foreign body along the plantar aspect of
the foot as described.

## 2017-04-07 MED ORDER — ACYCLOVIR 800 MG PO TABS
800.0000 mg | ORAL_TABLET | Freq: Every day | ORAL | Status: DC
  Filled 2017-04-07 (×3): qty 1

## 2017-04-07 MED ORDER — CARVEDILOL 3.125 MG PO TABS
3.1250 mg | ORAL_TABLET | Freq: Two times a day (BID) | ORAL | Status: DC
  Administered 2017-04-07 – 2017-04-15 (×15): 3.125 mg via ORAL
  Filled 2017-04-07 (×18): qty 1

## 2017-04-07 MED ORDER — ONDANSETRON HCL 4 MG/2ML IJ SOLN
4.0000 mg | Freq: Once | INTRAMUSCULAR | Status: AC
  Administered 2017-04-07: 4 mg via INTRAVENOUS
  Filled 2017-04-07: qty 2

## 2017-04-07 MED ORDER — LIDOCAINE HCL (PF) 2 % IJ SOLN
10.0000 mL | Freq: Once | INTRAMUSCULAR | Status: DC
  Filled 2017-04-07: qty 10

## 2017-04-07 MED ORDER — ONDANSETRON HCL 4 MG/2ML IJ SOLN: 4.0000 mg | Freq: Four times a day (QID) | INTRAMUSCULAR | Status: DC | PRN

## 2017-04-07 MED ORDER — PYRIDOSTIGMINE BROMIDE 60 MG PO TABS
60.0000 mg | ORAL_TABLET | Freq: Three times a day (TID) | ORAL | Status: DC
  Administered 2017-04-07 – 2017-04-15 (×23): 60 mg via ORAL
  Filled 2017-04-07 (×29): qty 1

## 2017-04-07 MED ORDER — SODIUM CHLORIDE 0.9% FLUSH
3.0000 mL | INTRAVENOUS | Status: DC | PRN
  Administered 2017-04-07: 3 mL via INTRAVENOUS
  Filled 2017-04-07: qty 3

## 2017-04-07 MED ORDER — SODIUM CHLORIDE 0.9 % IV SOLN
INTRAVENOUS | Status: DC
  Administered 2017-04-07 – 2017-04-09 (×3): via INTRAVENOUS

## 2017-04-07 MED ORDER — ASPIRIN EC 81 MG PO TBEC
81.0000 mg | DELAYED_RELEASE_TABLET | Freq: Every day | ORAL | Status: DC
  Administered 2017-04-08 – 2017-04-15 (×7): 81 mg via ORAL
  Filled 2017-04-07 (×8): qty 1

## 2017-04-07 MED ORDER — ACYCLOVIR 200 MG PO CAPS
800.0000 mg | ORAL_CAPSULE | Freq: Every day | ORAL | Status: DC
  Administered 2017-04-08 – 2017-04-15 (×5): 800 mg via ORAL
  Filled 2017-04-07 (×6): qty 4

## 2017-04-07 MED ORDER — PIPERACILLIN-TAZOBACTAM 3.375 G IVPB 30 MIN
3.3750 g | Freq: Once | INTRAVENOUS | Status: AC
  Administered 2017-04-07: 3.375 g via INTRAVENOUS
  Filled 2017-04-07: qty 50

## 2017-04-07 MED ORDER — SODIUM CHLORIDE 0.9% FLUSH
3.0000 mL | Freq: Two times a day (BID) | INTRAVENOUS | Status: DC
  Administered 2017-04-07 – 2017-04-15 (×10): 3 mL via INTRAVENOUS

## 2017-04-07 MED ORDER — MORPHINE SULFATE (PF) 2 MG/ML IV SOLN
2.0000 mg | INTRAVENOUS | Status: DC | PRN
  Administered 2017-04-07 – 2017-04-10 (×7): 2 mg via INTRAVENOUS
  Filled 2017-04-07 (×7): qty 1

## 2017-04-07 MED ORDER — SODIUM CHLORIDE 0.9 % IV SOLN: 250.0000 mL | INTRAVENOUS | Status: DC | PRN

## 2017-04-07 MED ORDER — MORPHINE SULFATE (PF) 4 MG/ML IV SOLN
4.0000 mg | Freq: Once | INTRAVENOUS | Status: AC
  Administered 2017-04-07: 4 mg via INTRAVENOUS
  Filled 2017-04-07: qty 1

## 2017-04-07 MED ORDER — PRAVASTATIN SODIUM 40 MG PO TABS
40.0000 mg | ORAL_TABLET | Freq: Every day | ORAL | Status: DC
  Administered 2017-04-08 – 2017-04-15 (×8): 40 mg via ORAL
  Filled 2017-04-07 (×10): qty 1

## 2017-04-07 MED ORDER — ONDANSETRON HCL 4 MG PO TABS: 4.0000 mg | ORAL_TABLET | Freq: Four times a day (QID) | ORAL | Status: DC | PRN

## 2017-04-07 MED ORDER — DEXTROSE 5 % IV SOLN
1.0000 g | INTRAVENOUS | Status: DC
  Administered 2017-04-08: 1 g via INTRAVENOUS
  Filled 2017-04-07 (×2): qty 10

## 2017-04-07 MED ORDER — ENOXAPARIN SODIUM 40 MG/0.4ML ~~LOC~~ SOLN
40.0000 mg | SUBCUTANEOUS | Status: DC
  Administered 2017-04-07 – 2017-04-15 (×9): 40 mg via SUBCUTANEOUS
  Filled 2017-04-07 (×9): qty 0.4

## 2017-04-07 MED ORDER — HYDROMORPHONE HCL 1 MG/ML IJ SOLN
0.5000 mg | Freq: Once | INTRAMUSCULAR | Status: AC
  Administered 2017-04-07: 0.5 mg via INTRAVENOUS
  Filled 2017-04-07: qty 1

## 2017-04-07 MED ORDER — DEXTROSE 5 % IV SOLN
1.0000 g | Freq: Once | INTRAVENOUS | Status: AC
  Administered 2017-04-07: 1 g via INTRAVENOUS
  Filled 2017-04-07: qty 10

## 2017-04-07 NOTE — Progress Notes (Signed)
Pharmacy Antibiotic Note  Natalie Contreras is a 51 y.o. female admitted on 04/07/2017 with cellulitis.  Pharmacy has been consulted for rocephin dosing. Rocephin 1gm given earlier today in ED  Plan: Continue rocephin 1gm IV q24 hours F/u cultures and clinical course  Height: 5\' 4"  (162.6 cm) Weight: 196 lb (88.9 kg) IBW/kg (Calculated) : 54.7  Temp (24hrs), Avg:97.7 F (36.5 C), Min:97.7 F (36.5 C), Max:97.7 F (36.5 C)   Recent Labs Lab 04/07/17 1140 04/07/17 1326  WBC 11.1*  --   CREATININE 0.82  --   LATICACIDVEN 1.0 0.7    Estimated Creatinine Clearance: 89.6 mL/min (by C-G formula based on SCr of 0.82 mg/dL).    No Known Allergies  Thank you for allowing pharmacy to be a part of this patient's care.  Excell Seltzer Poteet 04/07/2017 5:15 PM

## 2017-04-07 NOTE — ED Triage Notes (Addendum)
Pt reports stepping on glass at work on 7/14 when the glass punctured her left foot but she did not realize it until later due to peripheral neuropathy r/t DM. States she noticed the ulceration a few days ago, now area is painful, LLE is red and swollen. 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

## 2017-04-07 NOTE — H&P (Signed)
History and Physical    Natalie Contreras QQI:[id number] DOB: 01-25-1966 DOA: 04/07/2017  PCP: [name], MD   Patient coming from: Home   Chief Complaint: Left foot pain.    HPI: Natalie Contreras is a 51 y.o. female with medical history significant of type 2 diabetes mellitus. Patient presents with left foot pain. The pain is moderate to severe intensity, with local edema and erythema, there is no improving factors, it seems to be worse with ambulation. Denies any associated fevers chills, nausea vomiting. The pain does not radiate. Her symptoms started about 7 days ago with a injury to his left foot, she stepped on a glass, producing a cut on her forefoot. These symptoms have been worsening over last 7 days. Today the skin had worsening discoloration, that prompted her to come to the hospital.   ED Course: Diagnosed with a cellulitis, started on IV antibiotics, she had local debridement.  Review of Systems:  1. General. No fevers no chills, no waking a weight loss 2. ENT no runny nose or sore throat 3. Pulmonary no shortness of breath, cough or ecchymosis 4. Cardiovascular. No angina, no claudication, no PND orthopnea 5. Gastrointestinal, no nausea, no vomiting or diarrhea 6. Skeletal no joint pain 7. Hematology no easy bruisability or frequent infections 8. Endocrine a tremors daily called 9. Urology no dysuria or increased frequency 10. Skin positive for rash in the left foot as mentioned in history present illness  Past Medical History:  Diagnosis Date  . Diabetes mellitus without complication ([place])   . Heart murmur   . Hypertension     Past Surgical History:  Procedure Laterality Date  . CATARACT EXTRACTION W/PHACO Left 02/08/2016   Procedure: CATARACT EXTRACTION PHACO AND INTRAOCULAR LENS PLACEMENT (IOC);  Surgeon: Tonny Branch, MD;  Location: [facility];  Service: Ophthalmology;  Laterality: Left;  CDE 11.43   . CATARACT EXTRACTION W/PHACO Right 02/26/2016   Procedure:  CATARACT EXTRACTION PHACO AND INTRAOCULAR LENS PLACEMENT RIGHT; CDE:  16.90;  Surgeon: Tonny Branch, MD;  Location: [facility];  Service: Ophthalmology;  Laterality: Right;  . CHOLECYSTECTOMY       reports that she has been smoking Cigarettes.  She started smoking about 32 years ago. She has a 18.75 pack-year smoking history. She uses smokeless tobacco. She reports that she does not drink alcohol or use drugs.  No Known Allergies  Family History  Problem Relation Age of Onset  . Lung cancer Mother   . Lung cancer Father   . AAA (abdominal aortic aneurysm) Brother    Unacceptable: Noncontributory, unremarkable, or negative. Acceptable: Family history reviewed and not pertinent (If you reviewed it)  Prior to Admission medications   Medication Sig Start Date End Date Taking? Authorizing Provider  acyclovir (ZOVIRAX) 800 MG tablet Take 1 tablet by mouth daily. 02/19/17  Yes [provider]  aspirin EC 81 MG tablet Take 81 mg by mouth daily.   Yes [provider]  carvedilol (COREG) 3.125 MG tablet Take 1 tablet by mouth 2 (two) times daily. 10/08/16  Yes [provider]  glimepiride (AMARYL) 2 MG tablet Take 1 tablet by mouth daily. 03/15/17  Yes [provider]  glipiZIDE (GLUCOTROL) 5 MG tablet Take 5 mg by mouth daily before breakfast.   Yes [provider]  hydrALAZINE (APRESOLINE) 50 MG tablet Take 1 tablet (50 mg total) by mouth 3 (three) times daily. 01/15/17 04/15/17 Yes Natalie Burn, PA-C  lisinopril (PRINIVIL,ZESTRIL) 30 MG tablet Take 1 tablet  by mouth daily. 10/17/16  Yes [provider]  lovastatin (MEVACOR) 40 MG tablet Take 1 tablet by mouth at bedtime.  07/29/16  Yes [provider]  meclizine (ANTIVERT) 25 MG tablet Take 1 tablet (25 mg total) by mouth 3 (three) times daily as needed for dizziness. 02/07/17  Yes BranchAlphonse Guild, MD  metFORMIN (GLUCOPHAGE) 1000 MG tablet Take 1,000 mg by mouth daily.  01/10/16  Yes [provider]  potassium chloride SA (K-DUR,KLOR-CON) 20 MEQ tablet Take 2 tablets (40 mEq total) by mouth daily. 11/01/16  Yes Natalie Cota, MD  pyridostigmine (MESTINON) 60 MG tablet Take 1 tablet by mouth every 8 (eight) hours. 03/12/17  Yes [provider]  torsemide (DEMADEX) 20 MG tablet Take 40 mg in the AM and 20 mg in the PM 02/07/17  Yes Arnoldo Lenis, MD    Physical Exam: Vitals:   04/07/17 1300 04/07/17 1315 04/07/17 1330 04/07/17 1430  BP: (!) 118/46  (!) 122/48 (!) 116/56  Pulse: 65 65 65 (!) 59  Resp:      Temp:      TempSrc:      SpO2: (!) 88% 99% 99% 100%  Weight:      Height:        Constitutional: deconditioned  Vitals:   04/07/17 1300 04/07/17 1315 04/07/17 1330 04/07/17 1430  BP: (!) 118/46  (!) 122/48 (!) 116/56  Pulse: 65 65 65 (!) 59  Resp:      Temp:      TempSrc:      SpO2: (!) 88% 99% 99% 100%  Weight:      Height:       Eyes: PERRL, lids and conjunctivae normal, no pallor or icterus.  Head normocephalic, nose and ears with no deformities.  ENMT: Mucous membranes are dry. Posterior pharynx clear of any exudate or lesions.Normal dentition.  Neck: normal, supple, no masses, no thyromegaly Respiratory: clear to auscultation bilaterally, no wheezing, no crackles. Normal respiratory effort. No accessory muscle use.  Cardiovascular: Regular rate and rhythm, no murmurs / rubs / gallops. No extremity edema. 2+ pedal pulses. No carotid bruits.  Abdomen: no tenderness, no masses palpated. No hepatosplenomegaly. Bowel sounds positive.  Musculoskeletal: no clubbing / cyanosis. No joint deformity upper and lower extremities. Good ROM, no contractures. Normal muscle tone.  Skin: left foot with erythema and edema, increase local temperature, at the forefoot plantar region, base of the first metatarsal noted round ulcerated lesion, abut 10 mm diameter with clean borders.  Neurologic: CN 2-12 grossly intact. Sensation intact, DTR normal. Strength  5/5 in all 4.    Labs on Admission: I have personally reviewed following labs and imaging studies  CBC:  Recent Labs Lab 04/07/17 1140  WBC 11.1*  NEUTROABS 8.2*  HGB 10.9*  HCT 33.5*  MCV 89.6  PLT 993   Basic Metabolic Panel:  Recent Labs Lab 04/07/17 1140  NA 140  K 3.4*  CL 101  CO2 28  GLUCOSE 118*  BUN 27*  CREATININE 0.82  CALCIUM 8.9   GFR: Estimated Creatinine Clearance: 89.6 mL/min (by C-G formula based on SCr of 0.82 mg/dL). Liver Function Tests: No results for input(s): AST, ALT, ALKPHOS, BILITOT, PROT, ALBUMIN in the last 168 hours. No results for input(s): LIPASE, AMYLASE in the last 168 hours. No results for input(s): AMMONIA in the last 168 hours. Coagulation Profile: No results for input(s): INR, PROTIME in the last 168 hours. Cardiac Enzymes: No results for input(s): CKTOTAL,  CKMB, CKMBINDEX, TROPONINI in the last 168 hours. BNP (last 3 results) No results for input(s): PROBNP in the last 8760 hours. HbA1C: No results for input(s): HGBA1C in the last 72 hours. CBG: No results for input(s): GLUCAP in the last 168 hours. Lipid Profile: No results for input(s): CHOL, HDL, LDLCALC, TRIG, CHOLHDL, LDLDIRECT in the last 72 hours. Thyroid Function Tests: No results for input(s): TSH, T4TOTAL, FREET4, T3FREE, THYROIDAB in the last 72 hours. Anemia Panel: No results for input(s): VITAMINB12, FOLATE, FERRITIN, TIBC, IRON, RETICCTPCT in the last 72 hours. Urine analysis:    Component Value Date/Time   COLORURINE STRAW (A) 04/07/2017 1027   APPEARANCEUR CLEAR 04/07/2017 1027   LABSPEC 1.006 04/07/2017 1027   PHURINE 5.0 04/07/2017 1027   GLUCOSEU NEGATIVE 04/07/2017 1027   HGBUR NEGATIVE 04/07/2017 1027   BILIRUBINUR NEGATIVE 04/07/2017 1027   KETONESUR NEGATIVE 04/07/2017 1027   PROTEINUR NEGATIVE 04/07/2017 1027   NITRITE NEGATIVE 04/07/2017 1027   LEUKOCYTESUR NEGATIVE 04/07/2017 1027    Radiological Exams on Admission: Dg Chest 2  View  Result Date: 04/07/2017 CLINICAL DATA:  Congestion and rales on exam. EXAM: CHEST  2 VIEW COMPARISON:  10/28/2016 FINDINGS: The heart is mildly enlarged. Mild pulmonary vascular congestion is present without frank edema. There are no significant effusions. No focal airspace disease is present. The visualized soft tissues and bony thorax are unremarkable. IMPRESSION: 1. Mild cardiac enlargement and pulmonary vascular congestion. This may represent early congestive heart failure. Electronically Signed   By: San Morelle M.D.   On: 04/07/2017 11:32   Dg Foot Complete Left  Result Date: 04/07/2017 CLINICAL DATA:  Follow-up wound debridement, check for retained foreign body EXAM: LEFT FOOT - COMPLETE 3+ VIEW COMPARISON:  Film from earlier in the same day FINDINGS: There is again noted a a metallic density identified within the soft tissues of the plantar aspect of the foot between the second and third proximal phalanges. No acute fracture or dislocation is noted. A soft tissue defect consistent with the recent treatment is noted. The remainder the exam is stable from the prior study. IMPRESSION: Stable appearing Retained foreign body along the plantar aspect of the foot as described. Electronically Signed   By: Inez Catalina M.D.   On: 04/07/2017 14:49   Dg Foot Complete Left  Result Date: 04/07/2017 CLINICAL DATA:  Puncture wound of the bottom of the foot. EXAM: LEFT FOOT - COMPLETE 3+ VIEW COMPARISON:  None. FINDINGS: There is no evidence of fracture or dislocation. Mild osteoarthritis of the first MTP joint, first TMT joint and talonavicular joint. Enthesopathic changes of the Achilles tendon insertion. Tiny radiopaque foreign body in the soft tissues of the lateral base of the second toe along the plantar aspect. No soft tissue emphysema. IMPRESSION: Tiny radiopaque foreign body in the soft tissues of the lateral base of the second toe along the plantar aspect. No evidence of osteomyelitis.  Electronically Signed   By: Kathreen Devoid   On: 04/07/2017 11:13    EKG: Independently reviewed. NA  Assessment/Plan Active Problems:   Cellulitis   This is a 51 year old female with diabetes mellitus type 2 who presents with left foot injury for last 7 days. It has been worsening, associated with erythema, edema and discoloration. On initial physical examination blood pressure 122/48 , heart rate 65, oxygen saturation 99%. Lungs clear to auscultation, heart S1-S2 present rhythmic, abdomen soft, no lower extremity edema. Left foot with edema, erythema, increased local tenderness. Positive ulceration at the forefoot, plantar  region, base of the first metatarsal. Sodium 140, potassium 3.4, chloride 101, bicarbonate 28, glucose 118, BUN 27, creatinine 0.82, white count 11.1, hemoglobin 10.9, hematocrit 33.5, platelets 316, urinalysis negative for infection. Foot x-ray with stable appearing retained foreign object along the plantar aspect. Chest x-ray negative for infiltrates.  Working diagnosis left cellulitis.  1. Left foot cellulitis. Patient will be admitted to the medical unit, she will placed on IV antibiotic therapy with ceftriaxone, nonpurulent cellulitis. Patient will get an MRI of the foot to evaluate extension of the infection. Follow-up cell count, temperature curve and cultures.   2. Type 2 diabetes mellitus. We'll continue glucose coverage and monitor with insulin sliding scale. Diabetic diet. Will hold on oral hypoglycemic agents. Patient on glimepiride, glipizide and metformin at home.  3. Hypertension. Continue Coreg 3.125 g twice daily, hold on hydralazine, lisinopril and torsemide to avoid hypotension. Patient will receive gentle IV fluids.  4. Dyslipidemia. Continue lovastatin 40 mg, daily.   DVT prophylaxis: enoxaparin  Code Status: full  Family Communication: No family at the bedside  Disposition Plan: Home  Consults called: Admission status: inpatient   Monae Topping Gerome Apley MD [facility] Pager [phone]  If 7PM-7AM, please contact night-coverage [email or web address] Password [email or web address]  04/07/2017, 4:11 PM

## 2017-04-07 NOTE — ED Notes (Signed)
Pt unable to provide UA.

## 2017-04-07 NOTE — ED Provider Notes (Signed)
[facility] DEPT Provider Note   CSN: [id number] Arrival date & time: 04/07/17  9373     History   Chief Complaint Chief Complaint  Patient presents with  . Wound Infection    HPI Natalie Contreras is a 51 y.o. female.  [name] who presents to the emergency department with a complaint of pain and swelling of the left foot.  The patient states her usual approximately 9 days ago she was at work, was walking out to [employer], and apparently stepped on a piece of glass. The patient states that she is diabetic, she has diabetic neuropathy, and she did not notice the injury. On the following day when looking at her shoes she saw blood in her shoe and then found blood in her sock. She then noticed a piece of glass that was in the sock. She removed the glass and the thought she had removed the glass from her foot. She has been using conservative measures and over-the-counter medications to try to treat the foot injury. On yesterday she began to have increasing pain. Today she had unbearable pain and noticed redness and swelling involving the left foot. She presents now because she is having increasing pain of her foot and also she is concerned that the wound is slow in healing with her being a diabetic. She has had chills, but no actual fever. She's had some nausea as well. Nothing makes the pain any better. Standing on it or moving makes the pain worse.      Past Medical History:  Diagnosis Date  . Diabetes mellitus without complication ([place])   . Heart murmur   . Hypertension     Patient Active Problem List   Diagnosis Date Noted  . Carotid stenosis, asymptomatic, bilateral 01/15/2017  . Visual disturbance 01/15/2017  . Tobacco abuse 01/15/2017  . Acute respiratory failure with hypoxia ([place]) 10/30/2016  . Pulmonary hypertension ([place]) 10/29/2016  . Acute diastolic CHF (congestive heart failure) ([place]) 10/28/2016  . Benign essential HTN 10/28/2016  .  Hyperlipidemia 10/28/2016  . Diabetes mellitus type 2, noninsulin dependent ([place]) 10/28/2016    Past Surgical History:  Procedure Laterality Date  . CATARACT EXTRACTION W/PHACO Left 02/08/2016   Procedure: CATARACT EXTRACTION PHACO AND INTRAOCULAR LENS PLACEMENT (IOC);  Surgeon: Tonny Branch, MD;  Location: [facility];  Service: Ophthalmology;  Laterality: Left;  CDE 11.43   . CATARACT EXTRACTION W/PHACO Right 02/26/2016   Procedure: CATARACT EXTRACTION PHACO AND INTRAOCULAR LENS PLACEMENT RIGHT; CDE:  16.90;  Surgeon: Tonny Branch, MD;  Location: [facility];  Service: Ophthalmology;  Laterality: Right;  . CHOLECYSTECTOMY      OB History    No data available       Home Medications    Prior to Admission medications   Medication Sig Start Date End Date Taking? Authorizing Provider  aspirin EC 81 MG tablet Take 81 mg by mouth daily.    [provider]  carvedilol (COREG) 3.125 MG tablet Take 1 tablet by mouth 2 (two) times daily. 10/08/16   [provider]  glipiZIDE (GLUCOTROL) 5 MG tablet Take 5 mg by mouth daily before breakfast.    [provider]  hydrALAZINE (APRESOLINE) 50 MG tablet Take 1 tablet (50 mg total) by mouth 3 (three) times daily. 01/15/17 04/15/17  Imogene Burn, PA-C  lisinopril (PRINIVIL,ZESTRIL) 30 MG tablet Take 1 tablet by mouth daily. 10/17/16   [provider]  lovastatin (MEVACOR) 40 MG tablet Take 1 tablet by  mouth at bedtime.  07/29/16   [provider]  meclizine (ANTIVERT) 25 MG tablet Take 1 tablet (25 mg total) by mouth 3 (three) times daily as needed for dizziness. 02/07/17   Arnoldo Lenis, MD  metFORMIN (GLUCOPHAGE) 1000 MG tablet Take 1,000 mg by mouth daily.  01/10/16   [provider]  potassium chloride SA (K-DUR,KLOR-CON) 20 MEQ tablet Take 2 tablets (40 mEq total) by mouth daily. 11/01/16   Samuella Cota, MD  torsemide (DEMADEX) 20 MG tablet Take 40 mg in the AM and 20 mg in the PM 02/07/17   Branch,  Alphonse Guild, MD    Family History Family History  Problem Relation Age of Onset  . Lung cancer Mother   . Lung cancer Father   . AAA (abdominal aortic aneurysm) Brother     Social History Social History  Substance Use Topics  . Smoking status: Current Every Day Smoker    Packs/day: 0.75    Years: 25.00    Types: Cigarettes    Start date: 09/16/1984  . Smokeless tobacco: Current User  . Alcohol use No     Allergies   Patient has no known allergies.   Review of Systems Review of Systems  Constitutional: Positive for chills. Negative for activity change.       All ROS Neg except as noted in HPI  HENT: Negative for nosebleeds.   Eyes: Negative for photophobia and discharge.  Respiratory: Negative for cough, shortness of breath and wheezing.   Cardiovascular: Negative for chest pain and palpitations.  Gastrointestinal: Positive for nausea. Negative for abdominal pain and blood in stool.  Genitourinary: Negative for dysuria, frequency and hematuria.  Musculoskeletal: Negative for arthralgias, back pain and neck pain.  Skin: Positive for wound.       Left foot wound  Neurological: Negative for dizziness, seizures and speech difficulty.  Psychiatric/Behavioral: Negative for confusion and hallucinations.     Physical Exam Updated Vital Signs BP (!) 128/53 (BP Location: Left Arm)   Pulse 78   Temp 97.7 F (36.5 C) (Oral)   Resp 18   Ht 5\' 4"  (1.626 m)   Wt 88.9 kg (196 lb)   SpO2 98%   BMI 33.64 kg/m   Physical Exam  Constitutional: She is oriented to person, place, and time. She appears well-developed and well-nourished.  Non-toxic appearance.  HENT:  Head: Normocephalic.  Right Ear: Tympanic membrane and external ear normal.  Left Ear: Tympanic membrane and external ear normal.  Eyes: Pupils are equal, round, and reactive to light. EOM and lids are normal.  Neck: Normal range of motion. Neck supple. Carotid bruit is not present.  Cardiovascular: Regular rhythm,  normal heart sounds, intact distal pulses and normal pulses.  Bradycardia present.   Pulmonary/Chest: No accessory muscle usage. No respiratory distress. She has rhonchi. She has rales.  Few rales noted. Some scattered rhonchi also present. The patient speaks in complete sentences without problem.  Abdominal: Soft. Bowel sounds are normal. There is no tenderness. There is no guarding.  Musculoskeletal: Normal range of motion.  There is a puncture wound/ulcer of the plantar surface of the left foot between the second and third toe. There is tenderness to palpation. There is increased redness from the first toe over to the third toe extending into the midfoot. There is some increased warmth present. The area is extremely tender to touch. The dorsalis pedis pulses 2+. The posterior tibial pulses 2+. There is increased redness of the anterior tibial  area on the right and left lower extremity. The area is not hot. There is 2+ pitting edema of the right and left lower extremities.  Lymphadenopathy:       Head (right side): No submandibular adenopathy present.       Head (left side): No submandibular adenopathy present.    She has no cervical adenopathy.  Neurological: She is alert and oriented to person, place, and time. She has normal strength. No cranial nerve deficit or sensory deficit.  Skin: Skin is warm and dry.  Psychiatric: She has a normal mood and affect. Her speech is normal.  Nursing note and vitals reviewed.    ED Treatments / Results  Labs (all labs ordered are listed, but only abnormal results are displayed) Labs Reviewed - No data to display  EKG  EKG Interpretation None       Radiology No results found.  Procedures Irrigation and debridement of the left foot.  Irrigation and debridement Date/Time: 04/07/2017 4:31 PM Performed by: Lily Kocher Authorized by: Lily Kocher  Consent: Verbal consent obtained. Risks and benefits: risks, benefits and alternatives were  discussed Consent given by: patient Patient understanding: patient states understanding of the procedure being performed Patient identity confirmed: verbally with patient Time out: Immediately prior to procedure a "time out" was called to verify the correct patient, procedure, equipment, support staff and site/side marked as required. Preparation: Patient was prepped and draped in the usual sterile fashion. Local anesthesia used: yes  Anesthesia: Local anesthesia used: yes Local Anesthetic: lidocaine 2% without epinephrine  Sedation: Patient sedated: no Patient tolerance: Patient tolerated the procedure well with no immediate complications    (including critical care time)  Medications Ordered in ED Medications - No data to display   Initial Impression / Assessment and Plan / ED Course  I have reviewed the triage vital signs and the nursing notes.  Pertinent labs & imaging results that were available during my care of the patient were reviewed by me and considered in my medical decision making (see chart for details).       Final Clinical Impressions(s) / ED Diagnoses MDM Vital signs reviewed. Patient given medication to assist with her pain.  Patient states pain has not improved with the pain medication. Will order additional medication.  The basic metabolic panel shows the potassium be slightly low 3.4, the glucose is elevated at 118, BUN is elevated at 27, otherwise the basic metabolic panel was within normal limits. The complete blood count shows an elevation in white blood cells of 11,100. There is slight anemia present. There is no noted shift to the left. Urinalysis is within normal limits.  X-ray of the left foot shows a tiny radiopaque foreign body in the soft tissue of the lateral base of the second toe along the plantar aspect. There is no fracture. There is no evidence of osteomyelitis. A chest x-ray was obtained which shows mild cardiac enlargement and pulmonary  vascular congestion consistent with some early congestive heart failure.  The patient was given intravenous Dilaudid. The patient states the pain is significantly improved.  The case was discussed in detail with Dr. Roderic Palau. Orthopedics consulted concerning the case. And they referred to general surgery. I discussed the case with Dr. Arnoldo Morale. He will see the patient is a hospital. I discussed the case with the hospitalist. And they will admit the patient to the hospital.  The scab wound of the plantar surface was debrided. An attempt was made to find the foreign body  noted on x-ray, but this was unsuccessful.    Final diagnoses:  Puncture wound of left foot  Foreign body in left foot  Cellulitis of left foot    New Prescriptions New Prescriptions   No medications on file     Annette Stable 04/07/17 1635    Milton Ferguson, MD 04/08/17 [phone]

## 2017-04-07 NOTE — ED Notes (Signed)
O2 applied at 2 lpm via [place]. 

## 2017-04-07 NOTE — ED Notes (Signed)
Assumed care of patient from [place], [place]. Pt resting quietly. No distress. Awaiting test results.

## 2017-04-08 DIAGNOSIS — S90852A Superficial foreign body, left foot, initial encounter: Secondary | ICD-10-CM

## 2017-04-08 DIAGNOSIS — S91332A Puncture wound without foreign body, left foot, initial encounter: Secondary | ICD-10-CM

## 2017-04-08 LAB — COMPREHENSIVE METABOLIC PANEL
ALBUMIN: 2.4 g/dL — AB (ref 3.5–5.0)
ALK PHOS: 140 U/L — AB (ref 38–126)
ALT: 30 U/L (ref 14–54)
AST: 30 U/L (ref 15–41)
Anion gap: 9 (ref 5–15)
BUN: 28 mg/dL — ABNORMAL HIGH (ref 6–20)
CALCIUM: 8.4 mg/dL — AB (ref 8.9–10.3)
CO2: 28 mmol/L (ref 22–32)
CREATININE: 1.09 mg/dL — AB (ref 0.44–1.00)
Chloride: 103 mmol/L (ref 101–111)
GFR calc Af Amer: >60 mL/min (ref >60)
GFR calc non Af Amer: 59 mL/min — ABNORMAL LOW (ref >60)
GLUCOSE: 68 mg/dL (ref 65–99)
Potassium: 3.5 mmol/L (ref 3.5–5.1)
SODIUM: 140 mmol/L (ref 135–145)
Total Bilirubin: 0.5 mg/dL (ref 0.3–1.2)
Total Protein: 5.7 g/dL — ABNORMAL LOW (ref 6.5–8.1)

## 2017-04-08 LAB — SURGICAL PCR SCREEN
MRSA, PCR: NEGATIVE
Staphylococcus aureus: NEGATIVE

## 2017-04-08 LAB — CBC
HCT: 29.3 % — ABNORMAL LOW (ref 36.0–46.0)
HEMOGLOBIN: 9.5 g/dL — AB (ref 12.0–15.0)
MCH: 28.9 pg (ref 26.0–34.0)
MCHC: 32.4 g/dL (ref 30.0–36.0)
MCV: 89.1 fL (ref 78.0–100.0)
PLATELETS: 269 10*3/uL (ref 150–400)
RBC: 3.29 MIL/uL — ABNORMAL LOW (ref 3.87–5.11)
RDW: 14.1 % (ref 11.5–15.5)
WBC: 7.9 10*3/uL (ref 4.0–10.5)

## 2017-04-08 LAB — HIV ANTIBODY (ROUTINE TESTING W REFLEX): HIV Screen 4th Generation wRfx: NONREACTIVE

## 2017-04-08 MED ORDER — CHLORHEXIDINE GLUCONATE CLOTH 2 % EX PADS
6.0000 | MEDICATED_PAD | Freq: Once | CUTANEOUS | Status: AC
  Administered 2017-04-08: 6 via TOPICAL

## 2017-04-08 MED ORDER — VANCOMYCIN HCL IN DEXTROSE 1-5 GM/200ML-% IV SOLN
1000.0000 mg | Freq: Two times a day (BID) | INTRAVENOUS | Status: DC
  Administered 2017-04-09 – 2017-04-10 (×4): 1000 mg via INTRAVENOUS
  Filled 2017-04-08 (×4): qty 200

## 2017-04-08 MED ORDER — CHLORHEXIDINE GLUCONATE CLOTH 2 % EX PADS
6.0000 | MEDICATED_PAD | Freq: Once | CUTANEOUS | Status: AC
  Administered 2017-04-09: 6 via TOPICAL

## 2017-04-08 MED ORDER — PIPERACILLIN-TAZOBACTAM 3.375 G IVPB
3.3750 g | Freq: Three times a day (TID) | INTRAVENOUS | Status: DC
Start: 2017-04-08 — End: 2017-04-10
  Administered 2017-04-08 – 2017-04-10 (×6): 3.375 g via INTRAVENOUS
  Filled 2017-04-08 (×6): qty 50

## 2017-04-08 MED ORDER — VANCOMYCIN HCL 10 G IV SOLR
1500.0000 mg | Freq: Once | INTRAVENOUS | Status: AC
  Administered 2017-04-08: 1500 mg via INTRAVENOUS
  Filled 2017-04-08: qty 1500

## 2017-04-08 NOTE — Consult Note (Signed)
Reason for Consult: Foreign body, left foot with cellulitis Referring Physician: [name] is an 51 y.o. female.  HPI: [name] who some glass 10 days ago and presented emergency room yesterday with worsening swelling and pain left foot. 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 She states that when she did step on the glass, she noticed blood on her sock. She was seen in the emergency room yesterday and an x-ray confirmed a foreign body in the left foot. And attempt by the emergency room was unsuccessful to remove it. The patient was admitted to the hospital for IV antibiotics. Patient currently has no pain. Patient has had a vascular workup in her private physician's office in the past.  Past Medical History:  Diagnosis Date  . Diabetes mellitus without complication ([place])   . Heart murmur   . Hypertension     Past Surgical History:  Procedure Laterality Date  . CATARACT EXTRACTION W/PHACO Left 02/08/2016   Procedure: CATARACT EXTRACTION PHACO AND INTRAOCULAR LENS PLACEMENT (IOC);  Surgeon: Tonny Branch, MD;  Location: [facility];  Service: Ophthalmology;  Laterality: Left;  CDE 11.43   . CATARACT EXTRACTION W/PHACO Right 02/26/2016   Procedure: CATARACT EXTRACTION PHACO AND INTRAOCULAR LENS PLACEMENT RIGHT; CDE:  16.90;  Surgeon: Tonny Branch, MD;  Location: [facility];  Service: Ophthalmology;  Laterality: Right;  . CHOLECYSTECTOMY      Family History  Problem Relation Age of Onset  . Lung cancer Mother   . Lung cancer Father   . AAA (abdominal aortic aneurysm) Brother     Social History:  reports that she has been smoking Cigarettes.  She started smoking about 32 years ago. She has a 18.75 pack-year smoking history. She uses smokeless tobacco. She reports that she does not drink alcohol or use drugs.  Allergies: No Known Allergies  Medications:  Scheduled: . acyclovir  800 mg Oral Daily  . aspirin EC  81 mg Oral  Daily  . carvedilol  3.125 mg Oral BID  . enoxaparin (LOVENOX) injection  40 mg Subcutaneous Q24H  . lidocaine  10 mL Other Once  . pravastatin  40 mg Oral q1800  . pyridostigmine  60 mg Oral Q8H  . sodium chloride flush  3 mL Intravenous Q12H    Results for orders placed or performed during the hospital encounter of 04/07/17 (from the past 48 hour(s))  Urinalysis, Routine w reflex microscopic     Status: Abnormal   Collection Time: 04/07/17 10:27 AM  Result Value Ref Range   Color, Urine STRAW (A) YELLOW   APPearance CLEAR CLEAR   Specific Gravity, Urine 1.006 1.005 - 1.030   pH 5.0 5.0 - 8.0   Glucose, UA NEGATIVE NEGATIVE mg/dL   Hgb urine dipstick NEGATIVE NEGATIVE   Bilirubin Urine NEGATIVE NEGATIVE   Ketones, ur NEGATIVE NEGATIVE mg/dL   Protein, ur NEGATIVE NEGATIVE mg/dL   Nitrite NEGATIVE NEGATIVE   Leukocytes, UA NEGATIVE NEGATIVE  Basic metabolic panel     Status: Abnormal   Collection Time: 04/07/17 11:40 AM  Result Value Ref Range   Sodium 140 135 - 145 mmol/L   Potassium 3.4 (L) 3.5 - 5.1 mmol/L   Chloride 101 101 - 111 mmol/L   CO2 28 22 - 32 mmol/L   Glucose, Bld 118 (H) 65 - 99 mg/dL   BUN 27 (H) 6 - 20 mg/dL   Creatinine, Ser 0.82 0.44 - 1.00 mg/dL  Calcium 8.9 8.9 - 10.3 mg/dL   GFR calc non Af Amer >60 >60 mL/min   GFR calc Af Amer >60 >60 mL/min    Comment: (NOTE) The eGFR has been calculated using the CKD EPI equation. This calculation has not been validated in all clinical situations. eGFR's persistently <60 mL/min signify possible Chronic Kidney Disease.    Anion gap 11 5 - 15  Lactic acid, plasma     Status: None   Collection Time: 04/07/17 11:40 AM  Result Value Ref Range   Lactic Acid, Venous 1.0 0.5 - 1.9 mmol/L  CBC with Differential     Status: Abnormal   Collection Time: 04/07/17 11:40 AM  Result Value Ref Range   WBC 11.1 (H) 4.0 - 10.5 K/uL   RBC 3.74 (L) 3.87 - 5.11 MIL/uL   Hemoglobin 10.9 (L) 12.0 - 15.0 g/dL   HCT 33.5 (L)  36.0 - 46.0 %   MCV 89.6 78.0 - 100.0 fL   MCH 29.1 26.0 - 34.0 pg   MCHC 32.5 30.0 - 36.0 g/dL   RDW 13.9 11.5 - 15.5 %   Platelets 316 150 - 400 K/uL   Neutrophils Relative % 75 %   Neutro Abs 8.2 (H) 1.7 - 7.7 K/uL   Lymphocytes Relative 17 %   Lymphs Abs 1.9 0.7 - 4.0 K/uL   Monocytes Relative 7 %   Monocytes Absolute 0.8 0.1 - 1.0 K/uL   Eosinophils Relative 1 %   Eosinophils Absolute 0.1 0.0 - 0.7 K/uL   Basophils Relative 0 %   Basophils Absolute 0.0 0.0 - 0.1 K/uL  Culture, blood (routine x 2)     Status: None (Preliminary result)   Collection Time: 04/07/17 11:40 AM  Result Value Ref Range   Specimen Description BLOOD BLOOD LEFT WRIST    Special Requests      BOTTLES DRAWN AEROBIC AND ANAEROBIC Blood Culture adequate volume   Culture PENDING    Report Status PENDING   Culture, blood (routine x 2)     Status: None (Preliminary result)   Collection Time: 04/07/17 11:40 AM  Result Value Ref Range   Specimen Description BLOOD BLOOD LEFT HAND    Special Requests      BOTTLES DRAWN AEROBIC AND ANAEROBIC Blood Culture adequate volume   Culture PENDING    Report Status PENDING   Lactic acid, plasma     Status: None   Collection Time: 04/07/17  1:26 PM  Result Value Ref Range   Lactic Acid, Venous 0.7 0.5 - 1.9 mmol/L  Wound or Superficial Culture     Status: None (Preliminary result)   Collection Time: 04/07/17  1:35 PM  Result Value Ref Range   Specimen Description FOOT    Special Requests Normal    Gram Stain      FEW WBC PRESENT, PREDOMINANTLY PMN MODERATE GRAM POSITIVE COCCI FEW GRAM POSITIVE RODS RARE GRAM NEGATIVE RODS Performed at [facility], [address], [place], [place] [address]    Culture PENDING    Report Status PENDING   Comprehensive metabolic panel     Status: Abnormal   Collection Time: 04/08/17  6:06 AM  Result Value Ref Range   Sodium 140 135 - 145 mmol/L   Potassium 3.5 3.5 - 5.1 mmol/L   Chloride 103 101 - 111 mmol/L   CO2 28 22  - 32 mmol/L   Glucose, Bld 68 65 - 99 mg/dL   BUN 28 (H) 6 - 20 mg/dL  Creatinine, Ser 1.09 (H) 0.44 - 1.00 mg/dL   Calcium 8.4 (L) 8.9 - 10.3 mg/dL   Total Protein 5.7 (L) 6.5 - 8.1 g/dL   Albumin 2.4 (L) 3.5 - 5.0 g/dL   AST 30 15 - 41 U/L   ALT 30 14 - 54 U/L   Alkaline Phosphatase 140 (H) 38 - 126 U/L   Total Bilirubin 0.5 0.3 - 1.2 mg/dL   GFR calc non Af Amer 59 (L) >60 mL/min   GFR calc Af Amer >60 >60 mL/min    Comment: (NOTE) The eGFR has been calculated using the CKD EPI equation. This calculation has not been validated in all clinical situations. eGFR's persistently <60 mL/min signify possible Chronic Kidney Disease.    Anion gap 9 5 - 15  CBC     Status: Abnormal   Collection Time: 04/08/17  6:06 AM  Result Value Ref Range   WBC 7.9 4.0 - 10.5 K/uL   RBC 3.29 (L) 3.87 - 5.11 MIL/uL   Hemoglobin 9.5 (L) 12.0 - 15.0 g/dL   HCT 29.3 (L) 36.0 - 46.0 %   MCV 89.1 78.0 - 100.0 fL   MCH 28.9 26.0 - 34.0 pg   MCHC 32.4 30.0 - 36.0 g/dL   RDW 14.1 11.5 - 15.5 %   Platelets 269 150 - 400 K/uL    Dg Chest 2 View  Result Date: 04/07/2017 CLINICAL DATA:  Congestion and rales on exam. EXAM: CHEST  2 VIEW COMPARISON:  10/28/2016 FINDINGS: The heart is mildly enlarged. Mild pulmonary vascular congestion is present without frank edema. There are no significant effusions. No focal airspace disease is present. The visualized soft tissues and bony thorax are unremarkable. IMPRESSION: 1. Mild cardiac enlargement and pulmonary vascular congestion. This may represent early congestive heart failure. Electronically Signed   By: San Morelle M.D.   On: 04/07/2017 11:32   Dg Foot Complete Left  Result Date: 04/07/2017 CLINICAL DATA:  Follow-up wound debridement, check for retained foreign body EXAM: LEFT FOOT - COMPLETE 3+ VIEW COMPARISON:  Film from earlier in the same day FINDINGS: There is again noted a a metallic density identified within the soft tissues of the plantar aspect  of the foot between the second and third proximal phalanges. No acute fracture or dislocation is noted. A soft tissue defect consistent with the recent treatment is noted. The remainder the exam is stable from the prior study. IMPRESSION: Stable appearing Retained foreign body along the plantar aspect of the foot as described. Electronically Signed   By: Inez Catalina M.D.   On: 04/07/2017 14:49   Dg Foot Complete Left  Result Date: 04/07/2017 CLINICAL DATA:  Puncture wound of the bottom of the foot. EXAM: LEFT FOOT - COMPLETE 3+ VIEW COMPARISON:  None. FINDINGS: There is no evidence of fracture or dislocation. Mild osteoarthritis of the first MTP joint, first TMT joint and talonavicular joint. Enthesopathic changes of the Achilles tendon insertion. Tiny radiopaque foreign body in the soft tissues of the lateral base of the second toe along the plantar aspect. No soft tissue emphysema. IMPRESSION: Tiny radiopaque foreign body in the soft tissues of the lateral base of the second toe along the plantar aspect. No evidence of osteomyelitis. Electronically Signed   By: Kathreen Devoid   On: 04/07/2017 11:13    ROS:  Pertinent items are noted in HPI.  Blood pressure (!) 112/52, pulse 73, temperature 99.5 F (37.5 C), temperature source Oral, resp. rate 18, height 5' 4" (1.626  m), weight 196 lb (88.9 kg), last menstrual period 12/16/2015, SpO2 (!) 86 %. Physical Exam: Pleasant well-developed well-nourished white female in no acute distress. Head is normocephalic, atraumatic Lungs clear auscultation with breath sounds bilaterally Heart examination reveals a regular rate and rhythm without S3, S4, murmurs Left foot is swollen and erythematous. A scab is noted along the plantar surface of the foot between the second and third metatarsal heads. No purulent drainage noted. I was unable to palpate a dorsalis pedis or posterior tibial pulse.  X-ray images reviewed  Assessment/Plan: Impression: Foreign body with  cellulitis, left foot, diabetes mellitus with neuropathy Plan: Will take patient to the operating room tomorrow for removal of foreign body of the left foot. The risks and benefits of the procedure fully explained to the patient, who gave informed consent.  Natalie Contreras 04/08/2017, 8:34 AM

## 2017-04-08 NOTE — Progress Notes (Signed)
Inpatient Diabetes Program Recommendations  AACE/ADA: New Consensus Statement on Inpatient Glycemic Control (2015)  Target Ranges:  Prepandial:   less than 140 mg/dL      Peak postprandial:   less than 180 mg/dL (1-2 hours)      Critically ill patients:  140 - 180 mg/dL   Results for Natalie Contreras, Natalie Contreras (MRN [id number]) as of 04/08/2017 09:47  Ref. Range 04/07/2017 11:40 04/08/2017 06:06  Glucose Latest Ref Range: 65 - 99 mg/dL 118 (H) 68   Review of Glycemic Control  Diabetes history: DM2 Outpatient Diabetes medications: Glipizide 5 mg QAM, Amaryl 1 mg daily, Metformin 1000 mg daily Current orders for Inpatient glycemic control: None  Inpatient Diabetes Program Recommendations: Correction (SSI): While inpatient, please consider ordering CBGs with Novolog 0-9 units TID with meals and Novolog 0-5 units QHS.  Thanks, Barnie Alderman, RN, MSN, CDE Diabetes Coordinator Inpatient Diabetes Program [phone] (Team Pager from 8am to 5pm)

## 2017-04-08 NOTE — Progress Notes (Signed)
Pharmacy Antibiotic Note  Natalie Contreras is a 51 y.o. female admitted on 04/07/2017 with cellulitis.  Was initially started on Rocephin but d/c'd in favor of Vanc and Zosyn.  Plan: Vancomycin 1500mg  x 1 then 1000mg  IV q12hrs Zosyn 3.375gm IV q8h F/u cultures and clinical course  Height: 5\' 4"  (162.6 cm) Weight: 196 lb (88.9 kg) IBW/kg (Calculated) : 54.7  Temp (24hrs), Avg:98.6 F (37 C), Min:98.1 F (36.7 C), Max:99.5 F (37.5 C)   Recent Labs Lab 04/07/17 1140 04/07/17 1326 04/08/17 0606  WBC 11.1*  --  7.9  CREATININE 0.82  --  1.09*  LATICACIDVEN 1.0 0.7  --     Estimated Creatinine Clearance: 67.4 mL/min (A) (by C-G formula based on SCr of 1.09 mg/dL (H)).    No Known Allergies  Thank you for allowing pharmacy to be a part of this patient's care.  Hart Robinsons A 04/08/2017 1:12 PM

## 2017-04-08 NOTE — Progress Notes (Signed)
PROGRESS NOTE    Natalie Contreras  ALP:[id number] DOB: May 31, 1966 DOA: 04/07/2017 PCP: [name], MD    Brief Narrative:  This is a 51 year old female with diabetes mellitus type 2 who presents with left foot injury for last 7 days. It has been worsening, associated with erythema, edema and discoloration. On initial physical examination blood pressure 122/48 , heart rate 65, oxygen saturation 99%. Lungs clear to auscultation, heart S1-S2 present rhythmic, abdomen soft, no lower extremity edema. Left foot with edema, erythema, increased local tenderness. Positive ulceration at the forefoot, plantar region, base of the first metatarsal. Sodium 140, potassium 3.4, chloride 101, bicarbonate 28, glucose 118, BUN 27, creatinine 0.82, white count 11.1, hemoglobin 10.9, hematocrit 33.5, platelets 316, urinalysis negative for infection. Foot x-ray with stable appearing retained foreign object along the plantar aspect. Chest x-ray negative for infiltrates.  Working diagnosis left cellulitis   Assessment & Plan:   Active Problems:   Cellulitis   1. Left foot cellulitis. Worsening rash and edema, will change antibiotic to Zosyn and Vancomycin, will continue to follow on cell count, temperature curve. Patient scheduled for OR in am, to extract body. Wbc at 7.9, cultures have been no growth.   2. Type 2 diabetes mellitus. Glucose coverage and monitor with insulin sliding scale. Capillary glucose   3. Hypertension.  Coreg 3.125 g twice daily, continue to hold on hydralazine, lisinopril and torsemide to avoid hypotension.   4. Dyslipidemia. On lovastatin 40 mg, daily.     DVT prophylaxis: enoxaparin  Code Status: full  Family Communication: no family at the bedside  Disposition Plan: Home   Consultants:   Surgery   Procedures:     Antimicrobials:   Vancomycin     Subjective: Patient with persistent edema, and erythema on the left foot, pain improved with analgesics, no  associated nausea or vomiting.   Objective: Vitals:   04/07/17 1839 04/07/17 2119 04/07/17 2205 04/08/17 0444  BP: 128/71  125/64 (!) 112/52  Pulse: 79  73 73  Resp: 18  18 18   Temp: 98.2 F (36.8 C)  98.1 F (36.7 C) 99.5 F (37.5 C)  TempSrc: Oral  Oral Oral  SpO2: 95% 96% 98% (!) 86%  Weight: 88.9 kg (196 lb)     Height: 5\' 4"  (1.626 m)       Intake/Output Summary (Last 24 hours) at 04/08/17 1245 Last data filed at 04/08/17 1020  Gross per 24 hour  Intake           983.75 ml  Output                0 ml  Net           983.75 ml   Filed Weights   04/07/17 0954 04/07/17 1839  Weight: 88.9 kg (196 lb) 88.9 kg (196 lb)    Examination:  General exam: deconditioned E ENT. No pallor or icterus, oral mucosa moist.   Respiratory system: Clear to auscultation. Respiratory effort normal. Cardiovascular system: S1 & S2 heard, RRR. No JVD, murmurs, rubs, gallops or clicks. No pedal edema. Gastrointestinal system: Abdomen is nondistended, soft and nontender. No organomegaly or masses felt. Normal bowel sounds heard. Central nervous system: Alert and oriented. No focal neurological deficits. Extremities: Symmetric 5 x 5 power. Skin: left foot with edema and erythema, tender to palpation and increased local temperature. .     Data Reviewed: I have personally reviewed following labs and imaging studies  CBC:  Recent Labs Lab 04/07/17  1140 04/08/17 0606  WBC 11.1* 7.9  NEUTROABS 8.2*  --   HGB 10.9* 9.5*  HCT 33.5* 29.3*  MCV 89.6 89.1  PLT 316 270   Basic Metabolic Panel:  Recent Labs Lab 04/07/17 1140 04/08/17 0606  NA 140 140  K 3.4* 3.5  CL 101 103  CO2 28 28  GLUCOSE 118* 68  BUN 27* 28*  CREATININE 0.82 1.09*  CALCIUM 8.9 8.4*   GFR: Estimated Creatinine Clearance: 67.4 mL/min (A) (by C-G formula based on SCr of 1.09 mg/dL (H)). Liver Function Tests:  Recent Labs Lab 04/08/17 0606  AST 30  ALT 30  ALKPHOS 140*  BILITOT 0.5  PROT 5.7*  ALBUMIN  2.4*   No results for input(s): LIPASE, AMYLASE in the last 168 hours. No results for input(s): AMMONIA in the last 168 hours. Coagulation Profile: No results for input(s): INR, PROTIME in the last 168 hours. Cardiac Enzymes: No results for input(s): CKTOTAL, CKMB, CKMBINDEX, TROPONINI in the last 168 hours. BNP (last 3 results) No results for input(s): PROBNP in the last 8760 hours. HbA1C: No results for input(s): HGBA1C in the last 72 hours. CBG: No results for input(s): GLUCAP in the last 168 hours. Lipid Profile: No results for input(s): CHOL, HDL, LDLCALC, TRIG, CHOLHDL, LDLDIRECT in the last 72 hours. Thyroid Function Tests: No results for input(s): TSH, T4TOTAL, FREET4, T3FREE, THYROIDAB in the last 72 hours. Anemia Panel: No results for input(s): VITAMINB12, FOLATE, FERRITIN, TIBC, IRON, RETICCTPCT in the last 72 hours. Sepsis Labs:  Recent Labs Lab 04/07/17 1140 04/07/17 1326  LATICACIDVEN 1.0 0.7    Recent Results (from the past 240 hour(s))  Culture, blood (routine x 2)     Status: None (Preliminary result)   Collection Time: 04/07/17 11:40 AM  Result Value Ref Range Status   Specimen Description BLOOD BLOOD LEFT WRIST  Final   Special Requests   Final    BOTTLES DRAWN AEROBIC AND ANAEROBIC Blood Culture adequate volume   Culture NO GROWTH < 24 HOURS  Final   Report Status PENDING  Incomplete  Culture, blood (routine x 2)     Status: None (Preliminary result)   Collection Time: 04/07/17 11:40 AM  Result Value Ref Range Status   Specimen Description BLOOD BLOOD LEFT HAND  Final   Special Requests   Final    BOTTLES DRAWN AEROBIC AND ANAEROBIC Blood Culture adequate volume   Culture NO GROWTH < 24 HOURS  Final   Report Status PENDING  Incomplete  Wound or Superficial Culture     Status: None (Preliminary result)   Collection Time: 04/07/17  1:35 PM  Result Value Ref Range Status   Specimen Description FOOT  Final   Special Requests Normal  Final   Gram  Stain   Final    FEW WBC PRESENT, PREDOMINANTLY PMN MODERATE GRAM POSITIVE COCCI FEW GRAM POSITIVE RODS RARE GRAM NEGATIVE RODS    Culture   Final    CULTURE REINCUBATED FOR BETTER GROWTH Performed at [facility], [address] [place], [place] [address]    Report Status PENDING  Incomplete  Surgical PCR screen     Status: None   Collection Time: 04/08/17  9:43 AM  Result Value Ref Range Status   MRSA, PCR NEGATIVE NEGATIVE Final   Staphylococcus aureus NEGATIVE NEGATIVE Final    Comment:        The Xpert SA Assay (FDA approved for NASAL specimens in patients over 50 years of age), is  one component of a comprehensive surveillance program.  Test performance has been validated by [facility] for patients greater than or equal to 25 year old. It is not intended to diagnose infection nor to guide or monitor treatment.          Radiology Studies: Dg Chest 2 View  Result Date: 04/07/2017 CLINICAL DATA:  Congestion and rales on exam. EXAM: CHEST  2 VIEW COMPARISON:  10/28/2016 FINDINGS: The heart is mildly enlarged. Mild pulmonary vascular congestion is present without frank edema. There are no significant effusions. No focal airspace disease is present. The visualized soft tissues and bony thorax are unremarkable. IMPRESSION: 1. Mild cardiac enlargement and pulmonary vascular congestion. This may represent early congestive heart failure. Electronically Signed   By: San Morelle M.D.   On: 04/07/2017 11:32   Dg Foot Complete Left  Result Date: 04/07/2017 CLINICAL DATA:  Follow-up wound debridement, check for retained foreign body EXAM: LEFT FOOT - COMPLETE 3+ VIEW COMPARISON:  Film from earlier in the same day FINDINGS: There is again noted a a metallic density identified within the soft tissues of the plantar aspect of the foot between the second and third proximal phalanges. No acute fracture or dislocation is noted. A soft tissue defect consistent with the  recent treatment is noted. The remainder the exam is stable from the prior study. IMPRESSION: Stable appearing Retained foreign body along the plantar aspect of the foot as described. Electronically Signed   By: Inez Catalina M.D.   On: 04/07/2017 14:49   Dg Foot Complete Left  Result Date: 04/07/2017 CLINICAL DATA:  Puncture wound of the bottom of the foot. EXAM: LEFT FOOT - COMPLETE 3+ VIEW COMPARISON:  None. FINDINGS: There is no evidence of fracture or dislocation. Mild osteoarthritis of the first MTP joint, first TMT joint and talonavicular joint. Enthesopathic changes of the Achilles tendon insertion. Tiny radiopaque foreign body in the soft tissues of the lateral base of the second toe along the plantar aspect. No soft tissue emphysema. IMPRESSION: Tiny radiopaque foreign body in the soft tissues of the lateral base of the second toe along the plantar aspect. No evidence of osteomyelitis. Electronically Signed   By: Kathreen Devoid   On: 04/07/2017 11:13        Scheduled Meds: . acyclovir  800 mg Oral Daily  . aspirin EC  81 mg Oral Daily  . carvedilol  3.125 mg Oral BID  . Chlorhexidine Gluconate Cloth  6 each Topical Once   And  . Chlorhexidine Gluconate Cloth  6 each Topical Once  . enoxaparin (LOVENOX) injection  40 mg Subcutaneous Q24H  . lidocaine  10 mL Other Once  . pravastatin  40 mg Oral q1800  . pyridostigmine  60 mg Oral Q8H  . sodium chloride flush  3 mL Intravenous Q12H   Continuous Infusions: . sodium chloride    . sodium chloride 75 mL/hr at 04/08/17 7035     LOS: 1 day        Tawni Millers, MD [facility] Pager [phone]  If 7PM-7AM, please contact night-coverage [email or web address] Password [facility] 04/08/2017, 12:45 PM

## 2017-04-09 ENCOUNTER — Inpatient Hospital Stay (HOSPITAL_COMMUNITY): Payer: BLUE CROSS/BLUE SHIELD | Admitting: Anesthesiology

## 2017-04-09 ENCOUNTER — Inpatient Hospital Stay (HOSPITAL_COMMUNITY): Payer: BLUE CROSS/BLUE SHIELD

## 2017-04-09 ENCOUNTER — Encounter: Payer: Self-pay | Admitting: Cardiology

## 2017-04-09 ENCOUNTER — Encounter (HOSPITAL_COMMUNITY): Payer: Self-pay | Admitting: *Deleted

## 2017-04-09 ENCOUNTER — Encounter (HOSPITAL_COMMUNITY)
Admission: EM | Disposition: A | Discharge status: Home / Self Care | Payer: Self-pay | Source: Home / Self Care | Attending: Internal Medicine

## 2017-04-09 ENCOUNTER — Ambulatory Visit: Payer: BLUE CROSS/BLUE SHIELD | Admitting: Cardiology

## 2017-04-09 DIAGNOSIS — S90852D Superficial foreign body, left foot, subsequent encounter: Secondary | ICD-10-CM

## 2017-04-09 DIAGNOSIS — S91332D Puncture wound without foreign body, left foot, subsequent encounter: Secondary | ICD-10-CM

## 2017-04-09 DIAGNOSIS — S90852A Superficial foreign body, left foot, initial encounter: Secondary | ICD-10-CM | POA: Diagnosis present

## 2017-04-09 HISTORY — PX: FOREIGN BODY REMOVAL: SHX962

## 2017-04-09 LAB — BASIC METABOLIC PANEL
ANION GAP: 9 (ref 5–15)
BUN: 26 mg/dL — ABNORMAL HIGH (ref 6–20)
CHLORIDE: 102 mmol/L (ref 101–111)
CO2: 27 mmol/L (ref 22–32)
Calcium: 8.3 mg/dL — ABNORMAL LOW (ref 8.9–10.3)
Creatinine, Ser: 0.9 mg/dL (ref 0.44–1.00)
GFR calc non Af Amer: >60 mL/min (ref >60)
GLUCOSE: 105 mg/dL — AB (ref 65–99)
Potassium: 3.3 mmol/L — ABNORMAL LOW (ref 3.5–5.1)
Sodium: 138 mmol/L (ref 135–145)

## 2017-04-09 LAB — GLUCOSE, CAPILLARY
GLUCOSE-CAPILLARY: 125 mg/dL — AB (ref 65–99)
GLUCOSE-CAPILLARY: 99 mg/dL (ref 65–99)
Glucose-Capillary: 124 mg/dL — ABNORMAL HIGH (ref 65–99)

## 2017-04-09 LAB — CBC WITH DIFFERENTIAL/PLATELET
BASOS ABS: 0 10*3/uL (ref 0.0–0.1)
BASOS PCT: 0 %
Eosinophils Absolute: 0.2 10*3/uL (ref 0.0–0.7)
Eosinophils Relative: 2 %
HEMATOCRIT: 27.9 % — AB (ref 36.0–46.0)
HEMOGLOBIN: 9.2 g/dL — AB (ref 12.0–15.0)
LYMPHS PCT: 25 %
Lymphs Abs: 2.2 10*3/uL (ref 0.7–4.0)
MCH: 29.3 pg (ref 26.0–34.0)
MCHC: 33 g/dL (ref 30.0–36.0)
MCV: 88.9 fL (ref 78.0–100.0)
Monocytes Absolute: 0.9 10*3/uL (ref 0.1–1.0)
Monocytes Relative: 11 %
NEUTROS ABS: 5.4 10*3/uL (ref 1.7–7.7)
NEUTROS PCT: 62 %
Platelets: 263 10*3/uL (ref 150–400)
RBC: 3.14 MIL/uL — ABNORMAL LOW (ref 3.87–5.11)
RDW: 14.1 % (ref 11.5–15.5)
WBC: 8.6 10*3/uL (ref 4.0–10.5)

## 2017-04-09 IMAGING — CR DG FOOT 2V*L*
1 series · 2 of 2 positions shown · non-contrast
Comparison: None

CLINICAL DATA: Foreign body excision LEFT foot

EXAM:
LEFT FOOT - 2 VIEW

[Series 2: lat · 0.17mm/px · 2 of 2 slices shown]
[im 1/2]
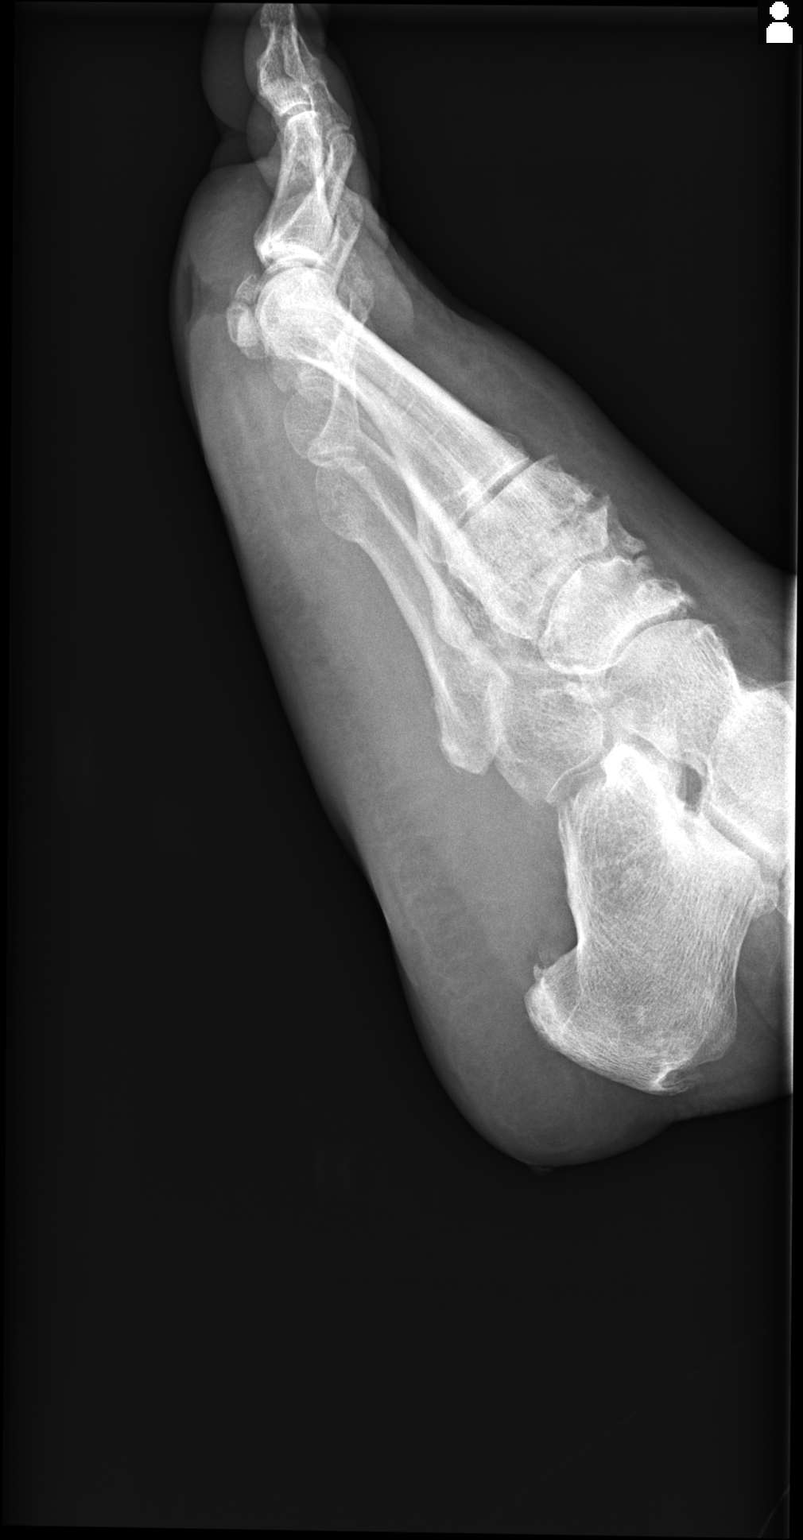
[im 2/2]
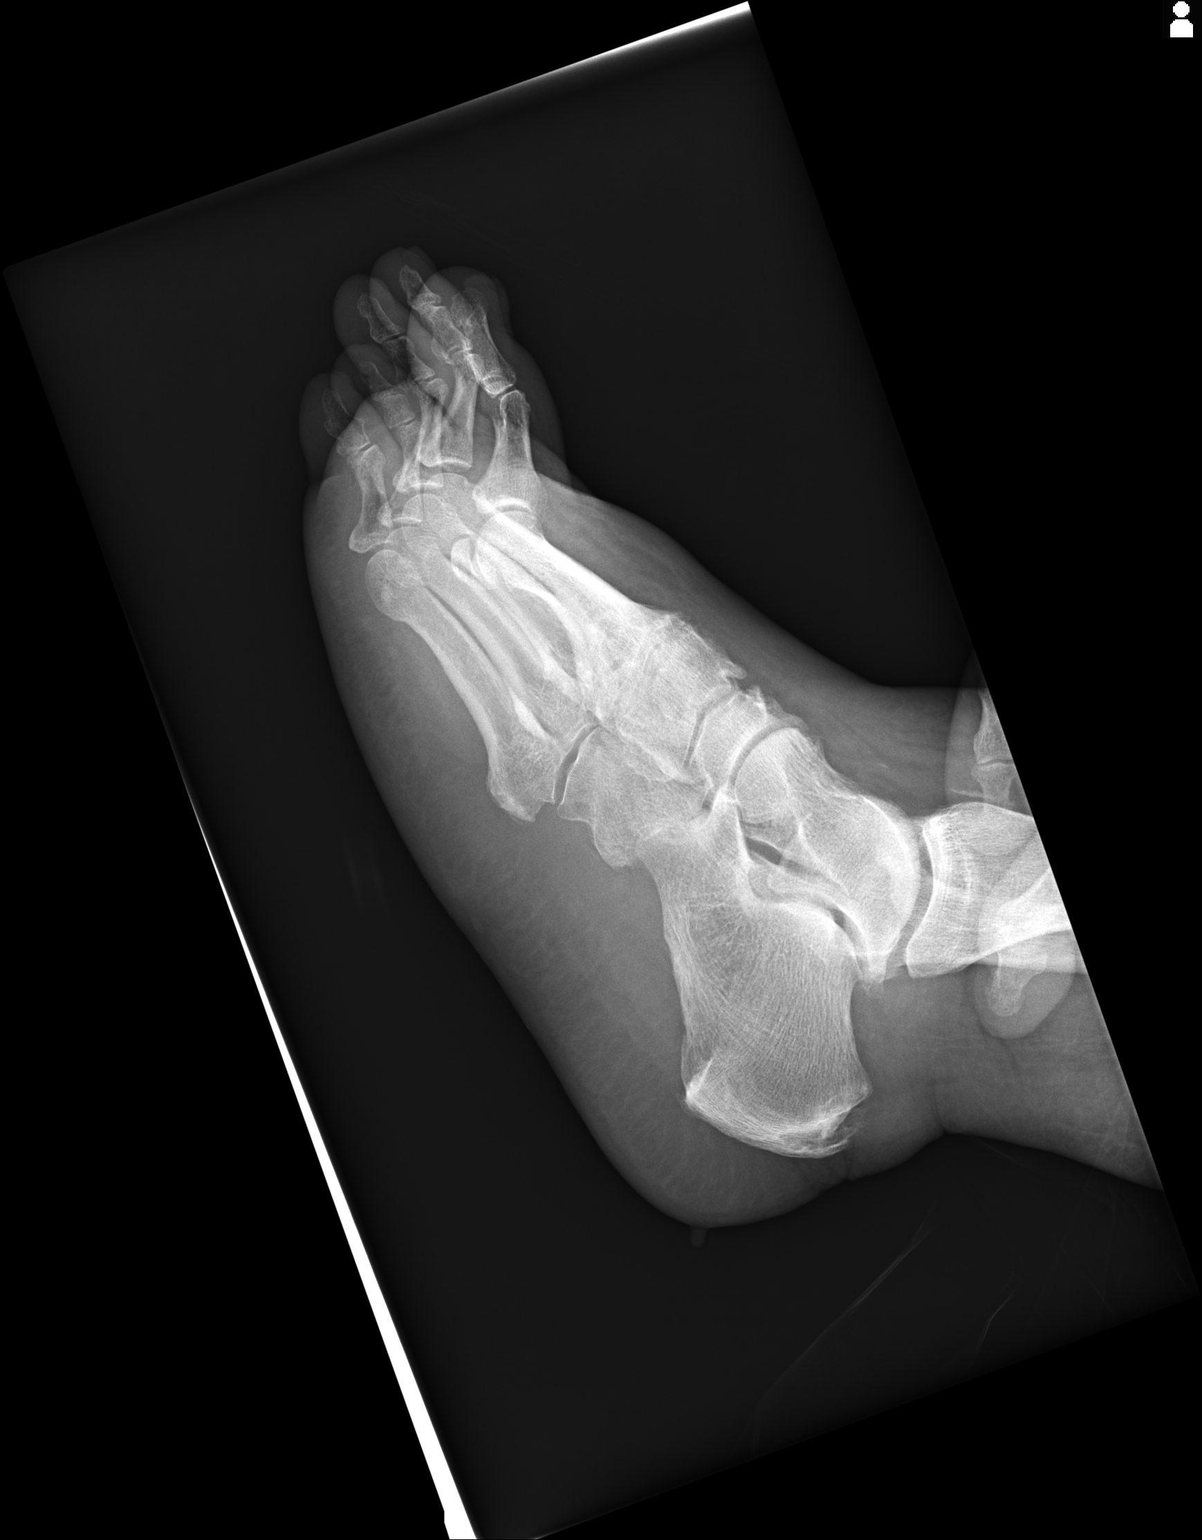

[2 of 2 positions shown; findings below may reference images not displayed]

FINDINGS: Lateral and oblique lateral views of the LEFT 4 were obtained
intraoperatively.

The tiny plantar radiopaque foreign body identified at the level of
the first MTP joint is no longer seen.

Soft tissue lucency is identified compatible with wound/incision.

Diffuse soft tissue swelling LEFT foot.

Small plantar and Achilles insertion calcaneal spurs.

Scattered intertarsal degenerative changes.

No fracture, dislocation or bone destruction.
IMPRESSION: Tiny plantar foreign body previous identified at the LEFT forefoot
is no longer identified.

## 2017-04-09 SURGERY — FOREIGN BODY REMOVAL ADULT
Anesthesia: General | Laterality: Left

## 2017-04-09 MED ORDER — BUPIVACAINE HCL (PF) 0.5 % IJ SOLN
INTRAMUSCULAR | Status: AC
  Filled 2017-04-09: qty 30

## 2017-04-09 MED ORDER — BACITRACIN-NEOMYCIN-POLYMYXIN 400-5-5000 EX OINT
TOPICAL_OINTMENT | CUTANEOUS | Status: AC
  Filled 2017-04-09: qty 1

## 2017-04-09 MED ORDER — LIDOCAINE HCL (PF) 1 % IJ SOLN
INTRAMUSCULAR | Status: AC
  Filled 2017-04-09: qty 5

## 2017-04-09 MED ORDER — PROPOFOL 10 MG/ML IV BOLUS
INTRAVENOUS | Status: DC | PRN
  Administered 2017-04-09: 150 mg via INTRAVENOUS

## 2017-04-09 MED ORDER — MIDAZOLAM HCL 2 MG/2ML IJ SOLN
INTRAMUSCULAR | Status: AC
  Filled 2017-04-09: qty 2

## 2017-04-09 MED ORDER — HYDRALAZINE HCL 20 MG/ML IJ SOLN: 5.0000 mg | Freq: Four times a day (QID) | INTRAMUSCULAR | Status: DC | PRN

## 2017-04-09 MED ORDER — LIDOCAINE HCL (CARDIAC) 10 MG/ML IV SOLN
INTRAVENOUS | Status: DC | PRN
  Administered 2017-04-09: 50 mg via INTRAVENOUS

## 2017-04-09 MED ORDER — INSULIN ASPART 100 UNIT/ML ~~LOC~~ SOLN
0.0000 [IU] | Freq: Every day | SUBCUTANEOUS | Status: DC
  Administered 2017-04-11 – 2017-04-14 (×3): 2 [IU] via SUBCUTANEOUS

## 2017-04-09 MED ORDER — HYDRALAZINE HCL 25 MG PO TABS
50.0000 mg | ORAL_TABLET | Freq: Three times a day (TID) | ORAL | Status: DC
  Administered 2017-04-09 – 2017-04-15 (×18): 50 mg via ORAL
  Filled 2017-04-09 (×19): qty 2

## 2017-04-09 MED ORDER — LACTATED RINGERS IV SOLN
INTRAVENOUS | Status: DC
  Administered 2017-04-09: 08:00:00 via INTRAVENOUS

## 2017-04-09 MED ORDER — ONDANSETRON HCL 4 MG/2ML IJ SOLN
4.0000 mg | Freq: Once | INTRAMUSCULAR | Status: AC
  Administered 2017-04-09: 4 mg via INTRAVENOUS

## 2017-04-09 MED ORDER — ROCURONIUM BROMIDE 50 MG/5ML IV SOLN
INTRAVENOUS | Status: AC
  Filled 2017-04-09: qty 1

## 2017-04-09 MED ORDER — ONDANSETRON HCL 4 MG/2ML IJ SOLN
INTRAMUSCULAR | Status: AC
  Filled 2017-04-09: qty 2

## 2017-04-09 MED ORDER — PROPOFOL 10 MG/ML IV BOLUS
INTRAVENOUS | Status: AC
  Filled 2017-04-09: qty 20

## 2017-04-09 MED ORDER — KETOROLAC TROMETHAMINE 30 MG/ML IJ SOLN
30.0000 mg | Freq: Once | INTRAMUSCULAR | Status: AC
  Administered 2017-04-09: 30 mg via INTRAVENOUS

## 2017-04-09 MED ORDER — INSULIN ASPART 100 UNIT/ML ~~LOC~~ SOLN
0.0000 [IU] | Freq: Three times a day (TID) | SUBCUTANEOUS | Status: DC
  Administered 2017-04-09: 1 [IU] via SUBCUTANEOUS
  Administered 2017-04-10 – 2017-04-11 (×2): 2 [IU] via SUBCUTANEOUS
  Administered 2017-04-11: 3 [IU] via SUBCUTANEOUS
  Administered 2017-04-12: 1 [IU] via SUBCUTANEOUS
  Administered 2017-04-12: 5 [IU] via SUBCUTANEOUS
  Administered 2017-04-12: 2 [IU] via SUBCUTANEOUS
  Administered 2017-04-13: 3 [IU] via SUBCUTANEOUS
  Administered 2017-04-13 (×2): 2 [IU] via SUBCUTANEOUS
  Administered 2017-04-14: 1 [IU] via SUBCUTANEOUS
  Administered 2017-04-14: 3 [IU] via SUBCUTANEOUS
  Administered 2017-04-14 – 2017-04-15 (×3): 2 [IU] via SUBCUTANEOUS
  Administered 2017-04-15: 5 [IU] via SUBCUTANEOUS

## 2017-04-09 MED ORDER — LISINOPRIL 10 MG PO TABS
30.0000 mg | ORAL_TABLET | Freq: Every day | ORAL | Status: DC
  Administered 2017-04-09 – 2017-04-10 (×2): 30 mg via ORAL
  Filled 2017-04-09 (×3): qty 3

## 2017-04-09 MED ORDER — FENTANYL CITRATE (PF) 100 MCG/2ML IJ SOLN: 25.0000 ug | INTRAMUSCULAR | Status: DC | PRN

## 2017-04-09 MED ORDER — HYDRALAZINE HCL 25 MG PO TABS: 50.0000 mg | ORAL_TABLET | Freq: Three times a day (TID) | ORAL | Status: DC

## 2017-04-09 MED ORDER — 0.9 % SODIUM CHLORIDE (POUR BTL) OPTIME
TOPICAL | Status: DC | PRN
  Administered 2017-04-09 (×2): 1000 mL

## 2017-04-09 MED ORDER — MIDAZOLAM HCL 2 MG/2ML IJ SOLN
1.0000 mg | INTRAMUSCULAR | Status: AC
  Administered 2017-04-09: 2 mg via INTRAVENOUS

## 2017-04-09 MED ORDER — KETOROLAC TROMETHAMINE 30 MG/ML IJ SOLN
INTRAMUSCULAR | Status: AC
  Filled 2017-04-09: qty 1

## 2017-04-09 MED ORDER — INSULIN ASPART 100 UNIT/ML ~~LOC~~ SOLN
3.0000 [IU] | Freq: Three times a day (TID) | SUBCUTANEOUS | Status: DC
  Administered 2017-04-09 – 2017-04-10 (×3): 3 [IU] via SUBCUTANEOUS

## 2017-04-09 MED ORDER — BACITRACIN-NEOMYCIN-POLYMYXIN OINTMENT TUBE
TOPICAL_OINTMENT | CUTANEOUS | Status: DC | PRN
  Administered 2017-04-09: 1 via TOPICAL

## 2017-04-09 MED ORDER — FENTANYL CITRATE (PF) 100 MCG/2ML IJ SOLN
INTRAMUSCULAR | Status: DC | PRN
  Administered 2017-04-09: 50 ug via INTRAVENOUS

## 2017-04-09 MED ORDER — FENTANYL CITRATE (PF) 100 MCG/2ML IJ SOLN
INTRAMUSCULAR | Status: AC
  Filled 2017-04-09: qty 2

## 2017-04-09 MED ORDER — HYDROCODONE-ACETAMINOPHEN 5-325 MG PO TABS
1.0000 | ORAL_TABLET | ORAL | Status: DC | PRN
  Administered 2017-04-09 – 2017-04-11 (×4): 2 via ORAL
  Filled 2017-04-09 (×6): qty 2

## 2017-04-09 SURGICAL SUPPLY — 38 items
BAG HAMPER (MISCELLANEOUS) ×2 IMPLANT
BANDAGE ACE 3X5.8 VEL STRL LF (GAUZE/BANDAGES/DRESSINGS) ×2 IMPLANT
BANDAGE CONFORM 2X5YD N/S (GAUZE/BANDAGES/DRESSINGS) ×2 IMPLANT
BANDAGE ESMARK 4X12 BL STRL LF (DISPOSABLE) IMPLANT
BNDG ESMARK 4X12 BLUE STRL LF (DISPOSABLE)
CLOTH BEACON ORANGE TIMEOUT ST (SAFETY) ×2 IMPLANT
COVER LIGHT HANDLE STERIS (MISCELLANEOUS) ×4 IMPLANT
COVER MAYO STAND XLG (DRAPE) ×4 IMPLANT
COVER X RAY CASSETTE (MISCELLANEOUS) ×2 IMPLANT
DECANTER SPIKE VIAL GLASS SM (MISCELLANEOUS) IMPLANT
DRESSING TELFA 4X3 1S ST N-ADH (GAUZE/BANDAGES/DRESSINGS) ×2 IMPLANT
ELECT NEEDLE TIP 2.8 STRL (NEEDLE) IMPLANT
ELECT REM PT RETURN 9FT ADLT (ELECTROSURGICAL) ×2
ELECTRODE REM PT RTRN 9FT ADLT (ELECTROSURGICAL) ×1 IMPLANT
FORMALIN 10 PREFIL 480ML (MISCELLANEOUS) IMPLANT
GLOVE BIOGEL PI IND STRL 7.0 (GLOVE) ×1 IMPLANT
GLOVE BIOGEL PI INDICATOR 7.0 (GLOVE) ×1
GLOVE SURG SS PI 7.5 STRL IVOR (GLOVE) ×6 IMPLANT
GOWN STRL REUS W/TWL LRG LVL3 (GOWN DISPOSABLE) ×4 IMPLANT
KIT ROOM TURNOVER APOR (KITS) ×2 IMPLANT
LIQUID BAND (GAUZE/BANDAGES/DRESSINGS) IMPLANT
MANIFOLD NEPTUNE II (INSTRUMENTS) ×2 IMPLANT
NEEDLE HYPO 25X1 1.5 SAFETY (NEEDLE) ×2 IMPLANT
NS IRRIG 1000ML POUR BTL (IV SOLUTION) IMPLANT
PACK MINOR (CUSTOM PROCEDURE TRAY) ×2 IMPLANT
PAD ARMBOARD 7.5X6 YLW CONV (MISCELLANEOUS) ×2 IMPLANT
SET BASIN LINEN APH (SET/KITS/TRAYS/PACK) ×2 IMPLANT
SOL PREP PROV IODINE SCRUB 4OZ (MISCELLANEOUS) ×2 IMPLANT
SPONGE GAUZE 2X2 8PLY STRL LF (GAUZE/BANDAGES/DRESSINGS) IMPLANT
STRIP CLOSURE SKIN 1/4X3 (GAUZE/BANDAGES/DRESSINGS) IMPLANT
SUT ETHILON 3 0 FSL (SUTURE) IMPLANT
SUT PROLENE 3 0 PS 1 (SUTURE) IMPLANT
SUT VIC AB 3-0 SH 27 (SUTURE) ×1
SUT VIC AB 3-0 SH 27X BRD (SUTURE) ×1 IMPLANT
SUT VIC AB 4-0 PS2 27 (SUTURE) IMPLANT
SUT VICRYL AB 3-0 FS1 BRD 27IN (SUTURE) IMPLANT
SYR CONTROL 10ML LL (SYRINGE) ×2 IMPLANT
TOWEL OR 17X26 4PK STRL BLUE (TOWEL DISPOSABLE) ×2 IMPLANT

## 2017-04-09 NOTE — Progress Notes (Addendum)
Pt hit her left arm on the chair in her sleep and now has a wound just below the elbow. The area was cleansed and pink foam applied.

## 2017-04-09 NOTE — Anesthesia Preprocedure Evaluation (Signed)
Anesthesia Evaluation  Patient identified by MRN, date of birth, ID band Patient awake    Reviewed: Allergy & Precautions, NPO status , Patient's Chart, lab work & pertinent test results  Airway Mallampati: I  TM Distance: >3 FB     Dental  (+) Poor Dentition, Missing, Chipped   Pulmonary Current Smoker,    breath sounds clear to auscultation       Cardiovascular hypertension, Pt. on medications + Peripheral Vascular Disease and +CHF   Rhythm:Regular Rate:Normal     Neuro/Psych    GI/Hepatic negative GI ROS, Neg liver ROS,   Endo/Other  diabetes, Poorly Controlled, Type 2, Oral Hypoglycemic Agents  Renal/GU      Musculoskeletal   Abdominal   Peds  Hematology  (+) Blood dyscrasia, anemia ,   Anesthesia Other Findings   Reproductive/Obstetrics                             Anesthesia Physical Anesthesia Plan  ASA: III  Anesthesia Plan: General   Post-op Pain Management:    Induction: Intravenous  PONV Risk Score and Plan:   Airway Management Planned: LMA  Additional Equipment:   Intra-op Plan:   Post-operative Plan: Extubation in OR  Informed Consent: I have reviewed the patients History and Physical, chart, labs and discussed the procedure including the risks, benefits and alternatives for the proposed anesthesia with the patient or authorized representative who has indicated his/her understanding and acceptance.     Plan Discussed with:   Anesthesia Plan Comments:         Anesthesia Quick Evaluation

## 2017-04-09 NOTE — Evaluation (Signed)
Physical Therapy Evaluation Patient Details Name: Natalie Contreras MRN: [id number] DOB: 1965/11/04 Today's Date: 04/09/2017   History of Present Illness   [name] who some glass 10 days ago and presented emergency room yesterday with worsening swelling and pain left foot. 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 She states that when she did step on the glass, she noticed blood on her sock. She was seen in the emergency room yesterday and an x-ray confirmed a foreign body in the left foot. And attempt by the emergency room was unsuccessful to remove it. The patient was admitted to the hospital for IV antibiotics. Patient currently has no pain. Patient has had a vascular workup in her private physician's office in the past.  Clinical Impression  Natalie Contreras normally ambulates with no assistive device.  She lives with her daughter.  She has no steps to get into her home; nor are there any steps in her house.  She was I in bed mobility and mod I with ambulation with a walker.  She feels that she is able to walk without an assistive device but therapist reminded pt that she will be more sore tomorrow than she is at the present time.  Natalie Contreras does not need skilled physical therapy services at this time.    Follow Up Recommendations No PT follow up    Equipment Recommendations  Rolling walker with 5" wheels    Recommendations for Other Services   none    Precautions / Restrictions Precautions Precautions: None Restrictions Weight Bearing Restrictions: No      Mobility  Bed Mobility Overal bed mobility: Independent                Transfers Overall transfer level: Independent                  Ambulation/Gait Ambulation/Gait assistance: Modified independent (Device/Increase time) Ambulation Distance (Feet): 80 Feet Assistive device: Rolling walker (2 wheeled) Gait Pattern/deviations: Step-to pattern                 Pertinent Vitals/Pain Pain Assessment: No/denies pain (States that she just had pain medication )    Home Living Family/patient expects to be discharged to:: Private residence Living Arrangements: Children Available Help at Discharge: Family Type of Home: House Home Access: Level entry     Home Layout: One level Home Equipment: None      Prior Function Level of Independence: Independent                  Extremity/Trunk Assessment        Lower Extremity Assessment Lower Extremity Assessment: Overall WFL for tasks assessed       Communication   Communication: No difficulties  Cognition Arousal/Alertness: Awake/alert Behavior During Therapy: WFL for tasks assessed/performed Overall Cognitive Status: Within Functional Limits for tasks assessed                                               Assessment/Plan    PT Assessment Patent does not need any further PT services                         End of Session Equipment Utilized During Treatment: Gait belt Activity Tolerance: Patient tolerated treatment well Patient left: in  chair;with call bell/phone within reach        Time: 1500-1530 PT Time Calculation (min) (ACUTE ONLY): 30 min   Charges:   PT Evaluation $PT Eval Low Complexity: 1 Procedure        Rayetta Humphrey, PT CLT [phone] 04/09/2017, 3:30 PM

## 2017-04-09 NOTE — Anesthesia Procedure Notes (Signed)
Procedure Name: LMA Insertion Date/Time: 04/09/2017 8:53 AM Performed by: Vista Deck Pre-anesthesia Checklist: Patient identified, Patient being monitored, Emergency Drugs available, Timeout performed and Suction available Patient Re-evaluated:Patient Re-evaluated prior to induction Oxygen Delivery Method: Circle System Utilized Preoxygenation: Pre-oxygenation with 100% oxygen Induction Type: IV induction Ventilation: Mask ventilation without difficulty LMA: LMA inserted LMA Size: 3.0 Number of attempts: 1 Placement Confirmation: positive ETCO2 and breath sounds checked- equal and bilateral Tube secured with: Tape Dental Injury: Teeth and Oropharynx as per pre-operative assessment

## 2017-04-09 NOTE — Progress Notes (Deleted)
Clinical Summary Natalie Contreras is a 51 y.o.female seen today for follow up of the following medical problems.   1. Chronic diastolic HF - patient admitted 10/2016 with volume overload - echo 10/2016 LVEF 55-60%, grade II diastolic dysfunction - diuresed 15 lbs, discharge weight 209 lbs   - last visit we changed her to torsemide 40mg  in AM and 20mg  in PM - reports improved swelling. Weight down 12 lbs based on clinic weights.   - last visit weight down to 199 lbs.    2. Syncope - episoe occurred while walking at work - no prodrome. Has had prior dizziness but no other recent syncope  - last visit we lowered torsemide to 20mg  daily as she was orthostatic.  Past Medical History:  Diagnosis Date  . Diabetes mellitus without complication ([place])   . Heart murmur   . Hypertension      No Known Allergies   No current facility-administered medications for this visit.    No current outpatient prescriptions on file.   Facility-Administered Medications Ordered in Other Visits  Medication Dose Route Frequency Provider Last Rate Last Dose  . [MAR Hold] 0.9 %  sodium chloride infusion  250 mL Intravenous PRN Arrien, Jimmy Picket, MD      . 0.9 %  sodium chloride infusion   Intravenous Continuous Arrien, Jimmy Picket, MD 50 mL/hr at 04/08/17 1252    . [MAR Hold] acyclovir (ZOVIRAX) 200 MG capsule 800 mg  800 mg Oral Daily Arrien, Jimmy Picket, MD   800 mg at 04/08/17 [phone]  . [MAR Hold] aspirin EC tablet 81 mg  81 mg Oral Daily Arrien, Jimmy Picket, MD   81 mg at 04/08/17 0950  . [MAR Hold] carvedilol (COREG) tablet 3.125 mg  3.125 mg Oral BID Tawni Millers, MD   3.125 mg at 04/08/17 2111  . [MAR Hold] enoxaparin (LOVENOX) injection 40 mg  40 mg Subcutaneous Q24H Arrien, Jimmy Picket, MD   40 mg at 04/08/17 1708  . lactated ringers infusion   Intravenous Continuous Lerry Liner, MD      . Doug Sou Hold] lidocaine (XYLOCAINE) 2 % injection 10 mL  10 mL Other Once  Lily Kocher, PA-C      . [MAR Hold] morphine 2 MG/ML injection 2 mg  2 mg Intravenous Q4H PRN Arrien, Jimmy Picket, MD   2 mg at 04/08/17 2132  . [MAR Hold] ondansetron (ZOFRAN) tablet 4 mg  4 mg Oral Q6H PRN Arrien, Jimmy Picket, MD       Or  . Doug Sou Hold] ondansetron [facility]) injection 4 mg  4 mg Intravenous Q6H PRN Arrien, Jimmy Picket, MD      . ondansetron [facility]) injection 4 mg  4 mg Intravenous Once Lerry Liner, MD      . Doug Sou Hold] piperacillin-tazobactam (ZOSYN) IVPB 3.375 g  3.375 g Intravenous Q8H Tawni Millers, MD 12.5 mL/hr at 04/09/17 0620 3.375 g at 04/09/17 0620  . [MAR Hold] pravastatin (PRAVACHOL) tablet 40 mg  40 mg Oral q1800 Tawni Millers, MD   40 mg at 04/08/17 1708  . [MAR Hold] pyridostigmine (MESTINON) tablet 60 mg  60 mg Oral Q8H Arrien, Jimmy Picket, MD   60 mg at 04/08/17 2111  . [MAR Hold] sodium chloride flush (NS) 0.9 % injection 3 mL  3 mL Intravenous Q12H Arrien, Jimmy Picket, MD   3 mL at 04/08/17 2110  . [MAR Hold] sodium chloride flush (NS) 0.9 % injection 3 mL  3  mL Intravenous PRN Arrien, Jimmy Picket, MD   3 mL at 04/07/17 2159  . [MAR Hold] vancomycin (VANCOCIN) IVPB 1000 mg/200 mL premix  1,000 mg Intravenous Q12H Arrien, Jimmy Picket, MD   Stopped at 04/09/17 [phone]     Past Surgical History:  Procedure Laterality Date  . CATARACT EXTRACTION W/PHACO Left 02/08/2016   Procedure: CATARACT EXTRACTION PHACO AND INTRAOCULAR LENS PLACEMENT (IOC);  Surgeon: Tonny , MD;  Location: [facility];  Service: Ophthalmology;  Laterality: Left;  CDE 11.43   . CATARACT EXTRACTION W/PHACO Right 02/26/2016   Procedure: CATARACT EXTRACTION PHACO AND INTRAOCULAR LENS PLACEMENT RIGHT; CDE:  16.90;  Surgeon: Tonny , MD;  Location: [facility];  Service: Ophthalmology;  Laterality: Right;  . CHOLECYSTECTOMY       No Known Allergies    Family History  Problem Relation Age of Onset  . Lung cancer Mother   . Lung cancer Father     . AAA (abdominal aortic aneurysm) Brother      Social History Ms. Plante reports that she has been smoking Cigarettes.  She started smoking about 32 years ago. She has a 18.75 pack-year smoking history. She uses smokeless tobacco. Ms. Gao reports that she does not drink alcohol.   Review of Systems CONSTITUTIONAL: No weight loss, fever, chills, weakness or fatigue.  HEENT: Eyes: No visual loss, blurred vision, double vision or yellow sclerae.No hearing loss, sneezing, congestion, runny nose or sore throat.  SKIN: No rash or itching.  CARDIOVASCULAR:  RESPIRATORY: No shortness of breath, cough or sputum.  GASTROINTESTINAL: No anorexia, nausea, vomiting or diarrhea. No abdominal pain or blood.  GENITOURINARY: No burning on urination, no polyuria NEUROLOGICAL: No headache, dizziness, syncope, paralysis, ataxia, numbness or tingling in the extremities. No change in bowel or bladder control.  MUSCULOSKELETAL: No muscle, back pain, joint pain or stiffness.  LYMPHATICS: No enlarged nodes. No history of splenectomy.  PSYCHIATRIC: No history of depression or anxiety.  ENDOCRINOLOGIC: No reports of sweating, cold or heat intolerance. No polyuria or polydipsia.  [name]   Physical Examination There were no vitals filed for this visit. There were no vitals filed for this visit.  Gen: resting comfortably, no acute distress HEENT: no scleral icterus, pupils equal round and reactive, no palptable cervical adenopathy,  CV Resp: Clear to auscultation bilaterally GI: abdomen is soft, non-tender, non-distended, normal bowel sounds, no hepatosplenomegaly MSK: extremities are warm, no edema.  Skin: warm, no rash Neuro:  no focal deficits Psych: appropriate affect   Diagnostic Studies 10/2016 echo Study Conclusions  - Left ventricle: The cavity size was normal. Wall thickness was increased in a pattern of mild LVH. Systolic function was normal. The estimated ejection fraction was in the range  of 55% to 60%. Wall motion was normal; there were no regional wall motion abnormalities. Features are consistent with a pseudonormal left ventricular filling pattern, with concomitant abnormal relaxation and increased filling pressure (grade 2 diastolic dysfunction). Doppler parameters are consistent with high ventricular filling pressure. - Aortic valve: Mildly calcified annulus. Trileaflet; mildly thickened leaflets. Valve area (VTI): 1.65 cm^2. Valve area (Vmax): 1.79 cm^2. Valve area (Vmean): 2.04 cm^2. - Mitral valve: Mildly calcified annulus. Mildly thickened leaflets . There was mild regurgitation. - Left atrium: The atrium was moderately dilated. - Right atrium: The atrium was mildly dilated. - Pulmonary arteries: Systolic pressure was moderately increased. PA peak pressure: 49 mm Hg (S). - Inferior vena cava: The vessel was dilated. The respirophasic diameter changes were blunted (<50%), consistent with elevated  central venous pressure. - Technically adequate study.   12/2016 carotid US IMPRESSION: 1. 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 2. Origin stenoses of the external carotid arteries, left worse than right, may account for the bruits on physical exam. 3. Antegrade bilateral vertebral arterial flow.     Assessment and Plan  1. Acute on chronic diastolic HF - signifcant improvement on torsemide - she had recent episode of syncope. She is orthostatic in clinic. I suspect though she still has peripheral edema that she is intravascualrly depleted and this led to her episdoe - lower toresmide to 20mg  daily. Repeat labs  2. Abnormal head CT - CT ordered after recent syncope episode. I suspect her syncope was orthoatic in nature however her CT head does show changes of which the significance is unclear to me. She is quite anxious about this report - I will refer to Dr  Arsenio Katz to evaluate findings and see if additional testing is indicated.    3. Syncope - orthostatic in clinic - diuretic changes as described above  F/u 6 weeks.       Arnoldo Lenis, M.D.

## 2017-04-09 NOTE — Transfer of Care (Signed)
Immediate Anesthesia Transfer of Care Note  Patient: Natalie Contreras  Procedure(s) Performed: Procedure(s): FOREIGN BODY REMOVAL ADULT FOOT (Left)  Patient Location: PACU  Anesthesia Type:General  Level of Consciousness: awake and unresponsive  Airway & Oxygen Therapy: Patient Spontanous Breathing and non-rebreather face mask  Post-op Assessment: Report given to RN and Post -op Vital signs reviewed and stable  Post vital signs: Reviewed and stable  Last Vitals:  Vitals:   04/09/17 0807 04/09/17 0838  BP: (!) 168/73 (!) 154/55  Pulse:    Resp:    Temp:      Last Pain:  Vitals:   04/09/17 0749  TempSrc: Oral  PainSc: 7       Patients Stated Pain Goal: 7 (85/02/77 4128)  Complications: No apparent anesthesia complications

## 2017-04-09 NOTE — Anesthesia Postprocedure Evaluation (Signed)
Anesthesia Post Note  Patient: Natalie Contreras  Procedure(s) Performed: Procedure(s) (LRB): FOREIGN BODY REMOVAL ADULT FOOT (Left)  Patient location during evaluation: PACU Anesthesia Type: General Level of consciousness: awake and alert Pain management: satisfactory to patient Vital Signs Assessment: post-procedure vital signs reviewed and stable Respiratory status: spontaneous breathing and patient connected to nasal cannula oxygen Cardiovascular status: stable Postop Assessment: no signs of nausea or vomiting Anesthetic complications: no     Last Vitals:  Vitals:   04/09/17 0945 04/09/17 1000  BP: (!) 166/64 (!) 175/66  Pulse: 72 69  Resp: 19 15  Temp:      Last Pain:  Vitals:   04/09/17 0749  TempSrc: Oral  PainSc: 7                  Indianna Boran

## 2017-04-09 NOTE — Progress Notes (Addendum)
[facility] PROGRESS NOTE    Progress Note  Natalie Contreras  BEE:[id number] DOB: 1965/11/14 DOA: 04/07/2017 PCP: [name], MD     Brief Narrative:   Natalie Contreras is an 51 y.o. female past medical history of diabetes type 2 present with left foot injury evidence for days prior to admission in the ED he was found to be afebrile with leukocytosis.  Assessment/Plan:   Active Problems:   Cellulitis   Foreign body in left foot  Now on IV vancomycin and Zosyn, surgery was consulted, recommended surgical intervention for Foreign body extraction, performed on 04/09/2017. KVO IV fluids. She has remained afebrile.  Type 2 diabetes mellitus: Continue sliding scale insulin. Next  Essential hypertension: Continue Coreg, continue to torsemide, restart metoprolol and hydralazine.   DVT prophylaxis: lovenox Family Communication:none Disposition Plan/Barrier to D/C: unable to determine Code Status:     Code Status Orders        Start     Ordered   04/07/17 1701  Full code  Continuous     04/07/17 1700    Code Status History    Date Active Date Inactive Code Status Order ID Comments User Context   10/28/2016  1:07 PM 11/01/2016  3:52 PM Full Code [id number]  Kathie Dike, MD Inpatient        IV Access:    Peripheral IV   Procedures and diagnostic studies:   Dg Foot 2 Views Left  Result Date: 04/09/2017 CLINICAL DATA:  Foreign body excision LEFT foot EXAM: LEFT FOOT - 2 VIEW COMPARISON:  None FINDINGS: Lateral and oblique lateral views of the LEFT 4 were obtained intraoperatively. The tiny plantar radiopaque foreign body identified at the level of the first MTP joint is no longer seen. Soft tissue lucency is identified compatible with wound/incision. Diffuse soft tissue swelling LEFT foot. Small plantar and Achilles insertion calcaneal spurs. Scattered intertarsal degenerative changes. No fracture, dislocation or bone destruction. IMPRESSION: Tiny plantar foreign  body previous identified at the LEFT forefoot is no longer identified. Electronically Signed   By: Lavonia Dana M.D.   On: 04/09/2017 09:22   Dg Foot Complete Left  Result Date: 04/07/2017 CLINICAL DATA:  Follow-up wound debridement, check for retained foreign body EXAM: LEFT FOOT - COMPLETE 3+ VIEW COMPARISON:  Film from earlier in the same day FINDINGS: There is again noted a a metallic density identified within the soft tissues of the plantar aspect of the foot between the second and third proximal phalanges. No acute fracture or dislocation is noted. A soft tissue defect consistent with the recent treatment is noted. The remainder the exam is stable from the prior study. IMPRESSION: Stable appearing Retained foreign body along the plantar aspect of the foot as described. Electronically Signed   By: Inez Catalina M.D.   On: 04/07/2017 14:49     Medical Consultants:    None.  Anti-Infectives:   IV vancomycin and Zosyn.  Subjective:    Natalie Contreras she relates her pain is controlled.  Objective:    Vitals:   04/09/17 0934 04/09/17 0945 04/09/17 1000 04/09/17 1015  BP: 134/61 (!) 166/64 (!) 175/66 (!) 174/63  Pulse: 70 72 69 75  Resp: 18 19 15 15   Temp: 98.7 F (37.1 C)     TempSrc:      SpO2: 100% 98% 100% 98%  Weight:      Height:        Intake/Output Summary (Last 24 hours) at 04/09/17 1356 Last data  filed at 04/09/17 1001  Gross per 24 hour  Intake          2724.17 ml  Output              400 ml  Net          2324.17 ml   Filed Weights   04/07/17 0954 04/07/17 1839 04/09/17 0751  Weight: 88.9 kg (196 lb) 88.9 kg (196 lb) 88.9 kg (196 lb)    Exam: General exam: In no acute distress, missing multiple teeth. Respiratory system: Good air movement in clear to auscultation. Cardiovascular system: Regular rate and rhythm with positive S1-S2 no murmurs rubs gallops. Gastrointestinal system: Abdomen is nondistended, soft and nontender.  Central nervous system: Alert  and oriented 3 nonfocal Extremities: 2+ edema. Skin: Left foot is wrapped, she has an erythema on the right lower extremity Psychiatry: Judgment and insight appear normal, mood and affect are appropriate.   Data Reviewed:    Labs: Basic Metabolic Panel:  Recent Labs Lab 04/07/17 1140 04/08/17 0606 04/09/17 0425  NA 140 140 138  K 3.4* 3.5 3.3*  CL 101 103 102  CO2 28 28 27   GLUCOSE 118* 68 105*  BUN 27* 28* 26*  CREATININE 0.82 1.09* 0.90  CALCIUM 8.9 8.4* 8.3*   GFR Estimated Creatinine Clearance: 81.6 mL/min (by C-G formula based on SCr of 0.9 mg/dL). Liver Function Tests:  Recent Labs Lab 04/08/17 0606  AST 30  ALT 30  ALKPHOS 140*  BILITOT 0.5  PROT 5.7*  ALBUMIN 2.4*   No results for input(s): LIPASE, AMYLASE in the last 168 hours. No results for input(s): AMMONIA in the last 168 hours. Coagulation profile No results for input(s): INR, PROTIME in the last 168 hours.  CBC:  Recent Labs Lab 04/07/17 1140 04/08/17 0606 04/09/17 0425  WBC 11.1* 7.9 8.6  NEUTROABS 8.2*  --  5.4  HGB 10.9* 9.5* 9.2*  HCT 33.5* 29.3* 27.9*  MCV 89.6 89.1 88.9  PLT 316 269 263   Cardiac Enzymes: No results for input(s): CKTOTAL, CKMB, CKMBINDEX, TROPONINI in the last 168 hours. BNP (last 3 results) No results for input(s): PROBNP in the last 8760 hours. CBG:  Recent Labs Lab 04/09/17 0747 04/09/17 0936  GLUCAP 124* 125*   D-Dimer: No results for input(s): DDIMER in the last 72 hours. Hgb A1c: No results for input(s): HGBA1C in the last 72 hours. Lipid Profile: No results for input(s): CHOL, HDL, LDLCALC, TRIG, CHOLHDL, LDLDIRECT in the last 72 hours. Thyroid function studies: No results for input(s): TSH, T4TOTAL, T3FREE, THYROIDAB in the last 72 hours.  Invalid input(s): FREET3 Anemia work up: No results for input(s): VITAMINB12, FOLATE, FERRITIN, TIBC, IRON, RETICCTPCT in the last 72 hours. Sepsis Labs:  Recent Labs Lab 04/07/17 1140  04/07/17 1326 04/08/17 0606 04/09/17 0425  WBC 11.1*  --  7.9 8.6  LATICACIDVEN 1.0 0.7  --   --    Microbiology Recent Results (from the past 240 hour(s))  Culture, blood (routine x 2)     Status: None (Preliminary result)   Collection Time: 04/07/17 11:40 AM  Result Value Ref Range Status   Specimen Description BLOOD BLOOD LEFT WRIST  Final   Special Requests   Final    BOTTLES DRAWN AEROBIC AND ANAEROBIC Blood Culture adequate volume   Culture NO GROWTH 2 DAYS  Final   Report Status PENDING  Incomplete  Culture, blood (routine x 2)     Status: None (Preliminary result)   Collection  Time: 04/07/17 11:40 AM  Result Value Ref Range Status   Specimen Description BLOOD BLOOD LEFT HAND  Final   Special Requests   Final    BOTTLES DRAWN AEROBIC AND ANAEROBIC Blood Culture adequate volume   Culture NO GROWTH 2 DAYS  Final   Report Status PENDING  Incomplete  Wound or Superficial Culture     Status: Abnormal (Preliminary result)   Collection Time: 04/07/17  1:35 PM  Result Value Ref Range Status   Specimen Description FOOT  Final   Special Requests Normal  Final   Gram Stain   Final    FEW WBC PRESENT, PREDOMINANTLY PMN MODERATE GRAM POSITIVE COCCI FEW GRAM POSITIVE RODS RARE GRAM NEGATIVE RODS Performed at [facility], [address], [place], [place] [address]    Culture MULTIPLE ORGANISMS PRESENT, NONE PREDOMINANT (A)  Final   Report Status PENDING  Incomplete  Surgical PCR screen     Status: None   Collection Time: 04/08/17  9:43 AM  Result Value Ref Range Status   MRSA, PCR NEGATIVE NEGATIVE Final   Staphylococcus aureus NEGATIVE NEGATIVE Final    Comment:        The Xpert SA Assay (FDA approved for NASAL specimens in patients over 26 years of age), is one component of a comprehensive surveillance program.  Test performance has been validated by [facility] for patients greater than or equal to 9 year old. It is not intended to diagnose infection nor  to guide or monitor treatment.      Medications:   . acyclovir  800 mg Oral Daily  . aspirin EC  81 mg Oral Daily  . carvedilol  3.125 mg Oral BID  . enoxaparin (LOVENOX) injection  40 mg Subcutaneous Q24H  . lidocaine  10 mL Other Once  . pravastatin  40 mg Oral q1800  . pyridostigmine  60 mg Oral Q8H  . sodium chloride flush  3 mL Intravenous Q12H   Continuous Infusions: . sodium chloride    . sodium chloride 50 mL/hr at 04/09/17 1123  . piperacillin-tazobactam (ZOSYN)  IV Stopped (04/09/17 1020)  . vancomycin Stopped (04/09/17 0556)      LOS: 2 days   Charlynne Cousins  [facility] Pager [phone]  *Please refer to [email or web address], password [email or web address] to get updated schedule on who will round on this patient, as hospitalists switch teams weekly. If 7PM-7AM, please contact night-coverage at [email or web address], password [email or web address] for any overnight needs.  04/09/2017, 1:56 PM

## 2017-04-09 NOTE — H&P (View-Only) (Signed)
Reason for Consult: Foreign body, left foot with cellulitis Referring Physician: [name] is an 51 y.o. female.  HPI: [name] who some glass 10 days ago and presented emergency room yesterday with worsening swelling and pain left foot. 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 She states that when she did step on the glass, she noticed blood on her sock. She was seen in the emergency room yesterday and an x-ray confirmed a foreign body in the left foot. And attempt by the emergency room was unsuccessful to remove it. The patient was admitted to the hospital for IV antibiotics. Patient currently has no pain. Patient has had a vascular workup in her private physician's office in the past.  Past Medical History:  Diagnosis Date  . Diabetes mellitus without complication ([place])   . Heart murmur   . Hypertension     Past Surgical History:  Procedure Laterality Date  . CATARACT EXTRACTION W/PHACO Left 02/08/2016   Procedure: CATARACT EXTRACTION PHACO AND INTRAOCULAR LENS PLACEMENT (IOC);  Surgeon: Tonny Branch, MD;  Location: [facility];  Service: Ophthalmology;  Laterality: Left;  CDE 11.43   . CATARACT EXTRACTION W/PHACO Right 02/26/2016   Procedure: CATARACT EXTRACTION PHACO AND INTRAOCULAR LENS PLACEMENT RIGHT; CDE:  16.90;  Surgeon: Tonny Branch, MD;  Location: [facility];  Service: Ophthalmology;  Laterality: Right;  . CHOLECYSTECTOMY      Family History  Problem Relation Age of Onset  . Lung cancer Mother   . Lung cancer Father   . AAA (abdominal aortic aneurysm) Brother     Social History:  reports that she has been smoking Cigarettes.  She started smoking about 32 years ago. She has a 18.75 pack-year smoking history. She uses smokeless tobacco. She reports that she does not drink alcohol or use drugs.  Allergies: No Known Allergies  Medications:  Scheduled: . acyclovir  800 mg Oral Daily  . aspirin EC  81 mg Oral  Daily  . carvedilol  3.125 mg Oral BID  . enoxaparin (LOVENOX) injection  40 mg Subcutaneous Q24H  . lidocaine  10 mL Other Once  . pravastatin  40 mg Oral q1800  . pyridostigmine  60 mg Oral Q8H  . sodium chloride flush  3 mL Intravenous Q12H    Results for orders placed or performed during the hospital encounter of 04/07/17 (from the past 48 hour(s))  Urinalysis, Routine w reflex microscopic     Status: Abnormal   Collection Time: 04/07/17 10:27 AM  Result Value Ref Range   Color, Urine STRAW (A) YELLOW   APPearance CLEAR CLEAR   Specific Gravity, Urine 1.006 1.005 - 1.030   pH 5.0 5.0 - 8.0   Glucose, UA NEGATIVE NEGATIVE mg/dL   Hgb urine dipstick NEGATIVE NEGATIVE   Bilirubin Urine NEGATIVE NEGATIVE   Ketones, ur NEGATIVE NEGATIVE mg/dL   Protein, ur NEGATIVE NEGATIVE mg/dL   Nitrite NEGATIVE NEGATIVE   Leukocytes, UA NEGATIVE NEGATIVE  Basic metabolic panel     Status: Abnormal   Collection Time: 04/07/17 11:40 AM  Result Value Ref Range   Sodium 140 135 - 145 mmol/L   Potassium 3.4 (L) 3.5 - 5.1 mmol/L   Chloride 101 101 - 111 mmol/L   CO2 28 22 - 32 mmol/L   Glucose, Bld 118 (H) 65 - 99 mg/dL   BUN 27 (H) 6 - 20 mg/dL   Creatinine, Ser 0.82 0.44 - 1.00 mg/dL  Calcium 8.9 8.9 - 10.3 mg/dL   GFR calc non Af Amer >60 >60 mL/min   GFR calc Af Amer >60 >60 mL/min    Comment: (NOTE) The eGFR has been calculated using the CKD EPI equation. This calculation has not been validated in all clinical situations. eGFR's persistently <60 mL/min signify possible Chronic Kidney Disease.    Anion gap 11 5 - 15  Lactic acid, plasma     Status: None   Collection Time: 04/07/17 11:40 AM  Result Value Ref Range   Lactic Acid, Venous 1.0 0.5 - 1.9 mmol/L  CBC with Differential     Status: Abnormal   Collection Time: 04/07/17 11:40 AM  Result Value Ref Range   WBC 11.1 (H) 4.0 - 10.5 K/uL   RBC 3.74 (L) 3.87 - 5.11 MIL/uL   Hemoglobin 10.9 (L) 12.0 - 15.0 g/dL   HCT 33.5 (L)  36.0 - 46.0 %   MCV 89.6 78.0 - 100.0 fL   MCH 29.1 26.0 - 34.0 pg   MCHC 32.5 30.0 - 36.0 g/dL   RDW 13.9 11.5 - 15.5 %   Platelets 316 150 - 400 K/uL   Neutrophils Relative % 75 %   Neutro Abs 8.2 (H) 1.7 - 7.7 K/uL   Lymphocytes Relative 17 %   Lymphs Abs 1.9 0.7 - 4.0 K/uL   Monocytes Relative 7 %   Monocytes Absolute 0.8 0.1 - 1.0 K/uL   Eosinophils Relative 1 %   Eosinophils Absolute 0.1 0.0 - 0.7 K/uL   Basophils Relative 0 %   Basophils Absolute 0.0 0.0 - 0.1 K/uL  Culture, blood (routine x 2)     Status: None (Preliminary result)   Collection Time: 04/07/17 11:40 AM  Result Value Ref Range   Specimen Description BLOOD BLOOD LEFT WRIST    Special Requests      BOTTLES DRAWN AEROBIC AND ANAEROBIC Blood Culture adequate volume   Culture PENDING    Report Status PENDING   Culture, blood (routine x 2)     Status: None (Preliminary result)   Collection Time: 04/07/17 11:40 AM  Result Value Ref Range   Specimen Description BLOOD BLOOD LEFT HAND    Special Requests      BOTTLES DRAWN AEROBIC AND ANAEROBIC Blood Culture adequate volume   Culture PENDING    Report Status PENDING   Lactic acid, plasma     Status: None   Collection Time: 04/07/17  1:26 PM  Result Value Ref Range   Lactic Acid, Venous 0.7 0.5 - 1.9 mmol/L  Wound or Superficial Culture     Status: None (Preliminary result)   Collection Time: 04/07/17  1:35 PM  Result Value Ref Range   Specimen Description FOOT    Special Requests Normal    Gram Stain      FEW WBC PRESENT, PREDOMINANTLY PMN MODERATE GRAM POSITIVE COCCI FEW GRAM POSITIVE RODS RARE GRAM NEGATIVE RODS Performed at [facility], [address], [place], [place] [address]    Culture PENDING    Report Status PENDING   Comprehensive metabolic panel     Status: Abnormal   Collection Time: 04/08/17  6:06 AM  Result Value Ref Range   Sodium 140 135 - 145 mmol/L   Potassium 3.5 3.5 - 5.1 mmol/L   Chloride 103 101 - 111 mmol/L   CO2 28 22  - 32 mmol/L   Glucose, Bld 68 65 - 99 mg/dL   BUN 28 (H) 6 - 20 mg/dL  Creatinine, Ser 1.09 (H) 0.44 - 1.00 mg/dL   Calcium 8.4 (L) 8.9 - 10.3 mg/dL   Total Protein 5.7 (L) 6.5 - 8.1 g/dL   Albumin 2.4 (L) 3.5 - 5.0 g/dL   AST 30 15 - 41 U/L   ALT 30 14 - 54 U/L   Alkaline Phosphatase 140 (H) 38 - 126 U/L   Total Bilirubin 0.5 0.3 - 1.2 mg/dL   GFR calc non Af Amer 59 (L) >60 mL/min   GFR calc Af Amer >60 >60 mL/min    Comment: (NOTE) The eGFR has been calculated using the CKD EPI equation. This calculation has not been validated in all clinical situations. eGFR's persistently <60 mL/min signify possible Chronic Kidney Disease.    Anion gap 9 5 - 15  CBC     Status: Abnormal   Collection Time: 04/08/17  6:06 AM  Result Value Ref Range   WBC 7.9 4.0 - 10.5 K/uL   RBC 3.29 (L) 3.87 - 5.11 MIL/uL   Hemoglobin 9.5 (L) 12.0 - 15.0 g/dL   HCT 29.3 (L) 36.0 - 46.0 %   MCV 89.1 78.0 - 100.0 fL   MCH 28.9 26.0 - 34.0 pg   MCHC 32.4 30.0 - 36.0 g/dL   RDW 14.1 11.5 - 15.5 %   Platelets 269 150 - 400 K/uL    Dg Chest 2 View  Result Date: 04/07/2017 CLINICAL DATA:  Congestion and rales on exam. EXAM: CHEST  2 VIEW COMPARISON:  10/28/2016 FINDINGS: The heart is mildly enlarged. Mild pulmonary vascular congestion is present without frank edema. There are no significant effusions. No focal airspace disease is present. The visualized soft tissues and bony thorax are unremarkable. IMPRESSION: 1. Mild cardiac enlargement and pulmonary vascular congestion. This may represent early congestive heart failure. Electronically Signed   By: San Morelle M.D.   On: 04/07/2017 11:32   Dg Foot Complete Left  Result Date: 04/07/2017 CLINICAL DATA:  Follow-up wound debridement, check for retained foreign body EXAM: LEFT FOOT - COMPLETE 3+ VIEW COMPARISON:  Film from earlier in the same day FINDINGS: There is again noted a a metallic density identified within the soft tissues of the plantar aspect  of the foot between the second and third proximal phalanges. No acute fracture or dislocation is noted. A soft tissue defect consistent with the recent treatment is noted. The remainder the exam is stable from the prior study. IMPRESSION: Stable appearing Retained foreign body along the plantar aspect of the foot as described. Electronically Signed   By: Inez Catalina M.D.   On: 04/07/2017 14:49   Dg Foot Complete Left  Result Date: 04/07/2017 CLINICAL DATA:  Puncture wound of the bottom of the foot. EXAM: LEFT FOOT - COMPLETE 3+ VIEW COMPARISON:  None. FINDINGS: There is no evidence of fracture or dislocation. Mild osteoarthritis of the first MTP joint, first TMT joint and talonavicular joint. Enthesopathic changes of the Achilles tendon insertion. Tiny radiopaque foreign body in the soft tissues of the lateral base of the second toe along the plantar aspect. No soft tissue emphysema. IMPRESSION: Tiny radiopaque foreign body in the soft tissues of the lateral base of the second toe along the plantar aspect. No evidence of osteomyelitis. Electronically Signed   By: Kathreen Devoid   On: 04/07/2017 11:13    ROS:  Pertinent items are noted in HPI.  Blood pressure (!) 112/52, pulse 73, temperature 99.5 F (37.5 C), temperature source Oral, resp. rate 18, height 5' 4" (1.626  m), weight 196 lb (88.9 kg), last menstrual period 12/16/2015, SpO2 (!) 86 %. Physical Exam: Pleasant well-developed well-nourished white female in no acute distress. Head is normocephalic, atraumatic Lungs clear auscultation with breath sounds bilaterally Heart examination reveals a regular rate and rhythm without S3, S4, murmurs Left foot is swollen and erythematous. A scab is noted along the plantar surface of the foot between the second and third metatarsal heads. No purulent drainage noted. I was unable to palpate a dorsalis pedis or posterior tibial pulse.  X-ray images reviewed  Assessment/Plan: Impression: Foreign body with  cellulitis, left foot, diabetes mellitus with neuropathy Plan: Will take patient to the operating room tomorrow for removal of foreign body of the left foot. The risks and benefits of the procedure fully explained to the patient, who gave informed consent.  Natalie Contreras 04/08/2017, 8:34 AM

## 2017-04-09 NOTE — Op Note (Signed)
Patient:  Natalie Contreras  DOB:  [birth year]  MRN:  [id number]   Preop Diagnosis:  Cellulitis, left foot Foreign body, left foot  Postop Diagnosis:  Same  Procedure:  Removal foreign body, left foot  Surgeon:  Aviva Signs, M.D.  Anes:  Gen.  Indications:  Patient is a 51 year old white female with diabetic neuropathy in the left foot who 10 days ago noticed blood at the sole of her foot and felt that she stepped on glass. She presented emergency room due to worsening left foot swelling. A small foreign body was noted in the sole of the left foot. The risks and benefits of the procedure were fully explained to the patient, who gave informed consent.  Procedure note:  The patient was placed in supine position. After general anesthesia was administered, the left foot was prepped and draped using usual sterile technique with Betadine. Surgical site confirmation was performed.  An open wound was already present in the left foot. This was explored and debrided using a curette. The foreign body was seen. Intraoperative x-rays revealed the foreign body to be absent from the left foot. The wound was irrigated with normal saline. 0.5% Sensorcaine was instilled into the surrounding wound. Triple antibiotic ointment and a dry sterile dressing were applied.  All tape and needle counts were correct at the end the procedure. The patient was awakened and transferred to PACU in stable condition.  Complications:  None  EBL:  Minimal  Specimen:  None

## 2017-04-09 NOTE — Interval H&P Note (Signed)
History and Physical Interval Note:  04/09/2017 8:08 AM  Natalie Contreras  has presented today for surgery, with the diagnosis of Foreign body, left foot  The various methods of treatment have been discussed with the patient and family. After consideration of risks, benefits and other options for treatment, the patient has consented to  Procedure(s): FOREIGN BODY REMOVAL ADULT FOOT (Left) as a surgical intervention .  The patient's history has been reviewed, patient examined, no change in status, stable for surgery.  I have reviewed the patient's chart and labs.  Questions were answered to the patient's satisfaction.     Aviva Signs

## 2017-04-10 ENCOUNTER — Inpatient Hospital Stay (HOSPITAL_COMMUNITY): Payer: BLUE CROSS/BLUE SHIELD

## 2017-04-10 ENCOUNTER — Encounter (HOSPITAL_COMMUNITY): Payer: Self-pay | Admitting: Internal Medicine

## 2017-04-10 DIAGNOSIS — I739 Peripheral vascular disease, unspecified: Secondary | ICD-10-CM

## 2017-04-10 DIAGNOSIS — E0840 Diabetes mellitus due to underlying condition with diabetic neuropathy, unspecified: Secondary | ICD-10-CM

## 2017-04-10 DIAGNOSIS — N289 Disorder of kidney and ureter, unspecified: Secondary | ICD-10-CM

## 2017-04-10 DIAGNOSIS — N179 Acute kidney failure, unspecified: Secondary | ICD-10-CM | POA: Diagnosis not present

## 2017-04-10 DIAGNOSIS — I10 Essential (primary) hypertension: Secondary | ICD-10-CM | POA: Diagnosis present

## 2017-04-10 DIAGNOSIS — I5032 Chronic diastolic (congestive) heart failure: Secondary | ICD-10-CM | POA: Diagnosis present

## 2017-04-10 DIAGNOSIS — I5033 Acute on chronic diastolic (congestive) heart failure: Secondary | ICD-10-CM | POA: Diagnosis present

## 2017-04-10 HISTORY — DX: Peripheral vascular disease, unspecified: I73.9

## 2017-04-10 LAB — CBC
HEMATOCRIT: 28.4 % — AB (ref 36.0–46.0)
HEMOGLOBIN: 9 g/dL — AB (ref 12.0–15.0)
MCH: 28.9 pg (ref 26.0–34.0)
MCHC: 31.7 g/dL (ref 30.0–36.0)
MCV: 91.3 fL (ref 78.0–100.0)
Platelets: 260 10*3/uL (ref 150–400)
RBC: 3.11 MIL/uL — AB (ref 3.87–5.11)
RDW: 13.6 % (ref 11.5–15.5)
WBC: 8.3 10*3/uL (ref 4.0–10.5)

## 2017-04-10 LAB — GLUCOSE, CAPILLARY
GLUCOSE-CAPILLARY: 152 mg/dL — AB (ref 65–99)
GLUCOSE-CAPILLARY: 66 mg/dL (ref 65–99)
Glucose-Capillary: 111 mg/dL — ABNORMAL HIGH (ref 65–99)
Glucose-Capillary: 118 mg/dL — ABNORMAL HIGH (ref 65–99)
Glucose-Capillary: 183 mg/dL — ABNORMAL HIGH (ref 65–99)

## 2017-04-10 LAB — BASIC METABOLIC PANEL
ANION GAP: 8 (ref 5–15)
BUN: 28 mg/dL — ABNORMAL HIGH (ref 6–20)
CHLORIDE: 103 mmol/L (ref 101–111)
CO2: 28 mmol/L (ref 22–32)
CREATININE: 1.39 mg/dL — AB (ref 0.44–1.00)
Calcium: 8.6 mg/dL — ABNORMAL LOW (ref 8.9–10.3)
GFR calc non Af Amer: 44 mL/min — ABNORMAL LOW (ref >60)
GFR, EST AFRICAN AMERICAN: 51 mL/min — AB (ref >60)
Glucose, Bld: 125 mg/dL — ABNORMAL HIGH (ref 65–99)
POTASSIUM: 3.7 mmol/L (ref 3.5–5.1)
Sodium: 139 mmol/L (ref 135–145)

## 2017-04-10 LAB — AEROBIC CULTURE W GRAM STAIN (SUPERFICIAL SPECIMEN): Special Requests: NORMAL

## 2017-04-10 MED ORDER — DEXTROSE 5 % IV SOLN
1.0000 g | INTRAVENOUS | Status: DC
  Administered 2017-04-11 – 2017-04-15 (×6): 1 g via INTRAVENOUS
  Filled 2017-04-10 (×7): qty 10

## 2017-04-10 NOTE — Progress Notes (Signed)
1 Day Post-Op  Subjective: Patient has minimal incisional pain.  Objective: Vital signs in last 24 hours: Temp:  [97.6 F (36.4 C)-98.7 F (37.1 C)] 98.5 F (36.9 C) (07/26 0525) Pulse Rate:  [57-75] 58 (07/26 0525) Resp:  [15-19] 16 (07/26 0525) BP: (110-175)/(61-88) 140/63 (07/26 0525) SpO2:  [98 %-100 %] 99 % (07/26 0525) Last BM Date: 04/06/17  Intake/Output from previous day: 07/25 0701 - 07/26 0700 In: 450 [I.V.:300; IV Piggyback:150] Out: 0  Intake/Output this shift: No intake/output data recorded.  General appearance: alert, cooperative and no distress Extremities: Left foot dressing intact. Does have some skin blistering over second toe  Lab Results:   Recent Labs  04/09/17 0425 04/10/17 0420  WBC 8.6 8.3  HGB 9.2* 9.0*  HCT 27.9* 28.4*  PLT 263 260   BMET  Recent Labs  04/09/17 0425 04/10/17 0420  NA 138 139  K 3.3* 3.7  CL 102 103  CO2 27 28  GLUCOSE 105* 125*  BUN 26* 28*  CREATININE 0.90 1.39*  CALCIUM 8.3* 8.6*   PT/INR No results for input(s): LABPROT, INR in the last 72 hours.  Studies/Results: Dg Foot 2 Views Left  Result Date: 04/09/2017 CLINICAL DATA:  Foreign body excision LEFT foot EXAM: LEFT FOOT - 2 VIEW COMPARISON:  None FINDINGS: Lateral and oblique lateral views of the LEFT 4 were obtained intraoperatively. The tiny plantar radiopaque foreign body identified at the level of the first MTP joint is no longer seen. Soft tissue lucency is identified compatible with wound/incision. Diffuse soft tissue swelling LEFT foot. Small plantar and Achilles insertion calcaneal spurs. Scattered intertarsal degenerative changes. No fracture, dislocation or bone destruction. IMPRESSION: Tiny plantar foreign body previous identified at the LEFT forefoot is no longer identified. Electronically Signed   By: Lavonia Dana M.D.   On: 04/09/2017 09:22    Anti-infectives: Anti-infectives    Start     Dose/Rate Route Frequency Ordered Stop   04/09/17 0600   vancomycin (VANCOCIN) IVPB 1000 mg/200 mL premix     1,000 mg 200 mL/hr over 60 Minutes Intravenous Every 12 hours 04/08/17 1306     04/08/17 1500  vancomycin (VANCOCIN) 1,500 mg in sodium chloride 0.9 % 500 mL IVPB     1,500 mg 250 mL/hr over 120 Minutes Intravenous  Once 04/08/17 1306 04/08/17 1908   04/08/17 1400  piperacillin-tazobactam (ZOSYN) IVPB 3.375 g     3.375 g 12.5 mL/hr over 240 Minutes Intravenous Every 8 hours 04/08/17 1300     04/08/17 1000  cefTRIAXone (ROCEPHIN) 1 g in dextrose 5 % 50 mL IVPB  Status:  Discontinued     1 g 100 mL/hr over 30 Minutes Intravenous Every 24 hours 04/07/17 1714 04/08/17 1245   04/08/17 1000  acyclovir (ZOVIRAX) 200 MG capsule 800 mg     800 mg Oral Daily 04/07/17 1846     04/07/17 1715  acyclovir (ZOVIRAX) tablet 800 mg  Status:  Discontinued     800 mg Oral Daily 04/07/17 1701 04/07/17 1845   04/07/17 1215  cefTRIAXone (ROCEPHIN) 1 g in dextrose 5 % 50 mL IVPB     1 g 100 mL/hr over 30 Minutes Intravenous  Once 04/07/17 1210 04/07/17 1454   04/07/17 1215  piperacillin-tazobactam (ZOSYN) IVPB 3.375 g     3.375 g 100 mL/hr over 30 Minutes Intravenous  Once 04/07/17 1210 04/07/17 1337      Assessment/Plan: s/p Procedure(s): FOREIGN BODY REMOVAL ADULT FOOT  impression: stable on postperaive day 1.  Will get segmental arterial doppler study today.  LOS: 3 days    Aviva Signs 04/10/2017

## 2017-04-10 NOTE — Progress Notes (Signed)
PROGRESS NOTE    Natalie Contreras  WNI:[id number] DOB: 05-08-1966 DOA: 04/07/2017 PCP: [name], MD    Brief Narrative:  [name] with DM-2 who presented with a left foot injury after apparently accidentally stepping on a piece of glass at work. She did not notice or feel the injury until the following few days when she saw blood in her shoe. 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 Her foot became red, swollen, and discolored. In the ED, she was afebrile and hemodynamically stable. Her lab data revealed:  sodium 140, potassium 3.4, chloride 101, bicarbonate 28, glucose 118, BUN27, creatinine 0.82, white count 11.1, hemoglobin 10.9, hematocrit 33.5, platelets 316, urinalysis negative for infection. Foot x-ray revealed stable appearing retained foreign object along the plantar aspect. Chest x-ray was negative for infiltrates. She was admitted for evaluation and management of left foot cellulitis.  Assessment & Plan:   Principal Problem:   Cellulitis of left foot Active Problems:   PAD (peripheral artery disease) (HCC)   Diabetes mellitus with neuropathy (HCC)   Chronic diastolic CHF (congestive heart failure) ([place])   Acute renal insufficiency   Foreign body in left foot   Essential hypertension   1. Left foot cellulitis associated with left foot foreign body. The patient was given Rocephin 1 in the ED and subsequently started on vancomycin. Supportive treatment and gentle IV fluids were given. -Zosyn was added to vancomycin for treatment. -General surgeon, Dr. Arnoldo Morale was consulted for removal of the foreign object from her left foot. She was taken to the OR  on 7/25 with successful removal of the foreign object. -ABI was ordered by Dr. Arnoldo Morale and it revealed PAD of the left lower extremity with an ABI of 0.74. -Wound culture prior to the operation revealed multiple organisms with none predominate. Blood cultures have remained negative to date. She is afebrile and  her white blood cell count has normalized. -Would favor narrowing antibiotic from Zosyn back to Rocephin, particularly in light of the elevated creatinine, negative cultures, and afebrile.  PAD Noted per arterial Dopplers. Patient also has a history of nonobstructive coronary artery disease. -Aspirin was restarted. Will order a fasting lipid profile.  Chronic diastolic heart failure. The patient is treated with torsemide chronically. It was held on admission. She was started on gentle IV fluids 48 hours. -In light of the recent increase in her creatinine, we'll continue to hold torsemide. -Her CHF appears to be stable.  Essential hypertension. Patient is treated chronically with carvedilol, hydralazine, and lisinopril. All were continued on admission. -Her blood pressure is stable and controlled.  Type 2 diabetes with neuropathy. Patient is treated chronically with glimeperide, glipizide, and metformin. Her oral agents were withheld and sliding scale NovoLog was started. -Her CBGs have been relatively controlled. Will order hemoglobin A1c.  Acute renal insufficiency. Patient has no history of chronic kidney disease. Her creatinine was within normal limits on admission. -Creatinine has trended up to 1.39. She does not appear volume depleted. She was hydrated the first 2 days of the hospitalization. -We'll continue to hold torsemide. Watch closely on chronic treatment with ACE inhibitor. -We will monitor her urine output. Of note, Zosyn discontinued in favor of Rocephin and get a vancomycin trough-discussed with pharmacy.  Normocytic anemia. Patient's hemoglobin was 10.9 on admission and has drifted down to 9.5>>9.0. -Patient appears to have chronic normocytic anemia, likely of chronic disease with an element of mild acute blood loss from the operation. -We'll order an anemia panel and  TSH; Hemoccult stools. We'll continue to monitor.    DVT prophylaxis: Lovenox Code Status: Full  code Family Communication: Family not available Disposition Plan: Discharge to home with clinically appropriate   Consultants:   Gen Surgery  Procedures:   ABI-base left ABI 0.74 (previously 0.79). Right ABI 0.96-stable. 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  Removal of foreign body, left foot, Dr. Arnoldo Morale, 04/09/17.  Antimicrobials:   Zosyn  Vancomycin 7/23>>  Rocephin 7/23 x 1.   Subjective: Patient says that her left foot is sore, but not particular painful. She denies diarrhea, vomiting, or sensation of feeling dehydrated. She believes her urine output is normal.  Objective: Vitals:   04/09/17 1412 04/09/17 2135 04/09/17 2229 04/10/17 0525  BP: (!) 149/88 110/76  140/63  Pulse: 67 (!) 57  (!) 58  Resp: 16 16  16   Temp: 98.3 F (36.8 C) 97.6 F (36.4 C)  98.5 F (36.9 C)  TempSrc: Oral Oral  Oral  SpO2: 98% 100% 98% 99%  Weight:      Height:        Intake/Output Summary (Last 24 hours) at 04/10/17 1342 Last data filed at 04/10/17 0300  Gross per 24 hour  Intake              150 ml  Output                0 ml  Net              150 ml   Filed Weights   04/07/17 0954 04/07/17 1839 04/09/17 0751  Weight: 88.9 kg (196 lb) 88.9 kg (196 lb) 88.9 kg (196 lb)    Examination:  General exam: Appears calm and comfortable  Respiratory system: Clear to auscultation. Respiratory effort normal. Cardiovascular system: S1 & S2 heard, With a 2/6 systolic murmur.. Trace right lower extremity/ pedal edema and trace- 1+ edema on the left lower extremity. Gastrointestinal system: Abdomen is obese, nondistended, soft and nontender. No organomegaly or masses felt. Normal bowel sounds heard. Central nervous system: Alert and oriented. No focal neurological deficits. Skin: Left foot with bandage in place, not taken off; mild erythema of the toes on the left foot noted; bilateral pretibial mild erythema. Psychiatry: Judgement and insight appear normal.  Mood & affect appropriate.     Data Reviewed: I have personally reviewed following labs and imaging studies  CBC:  Recent Labs Lab 04/07/17 1140 04/08/17 0606 04/09/17 0425 04/10/17 0420  WBC 11.1* 7.9 8.6 8.3  NEUTROABS 8.2*  --  5.4  --   HGB 10.9* 9.5* 9.2* 9.0*  HCT 33.5* 29.3* 27.9* 28.4*  MCV 89.6 89.1 88.9 91.3  PLT 316 269 263 829   Basic Metabolic Panel:  Recent Labs Lab 04/07/17 1140 04/08/17 0606 04/09/17 0425 04/10/17 0420  NA 140 140 138 139  K 3.4* 3.5 3.3* 3.7  CL 101 103 102 103  CO2 28 28 27 28   GLUCOSE 118* 68 105* 125*  BUN 27* 28* 26* 28*  CREATININE 0.82 1.09* 0.90 1.39*  CALCIUM 8.9 8.4* 8.3* 8.6*   GFR: Estimated Creatinine Clearance: 52.9 mL/min (A) (by C-G formula based on SCr of 1.39 mg/dL (H)). Liver Function Tests:  Recent Labs Lab 04/08/17 0606  AST 30  ALT 30  ALKPHOS 140*  BILITOT 0.5  PROT 5.7*  ALBUMIN 2.4*   No results for input(s): LIPASE, AMYLASE in the last 168 hours. No results for input(s): AMMONIA in the last 168 hours.  Coagulation Profile: No results for input(s): INR, PROTIME in the last 168 hours. Cardiac Enzymes: No results for input(s): CKTOTAL, CKMB, CKMBINDEX, TROPONINI in the last 168 hours. BNP (last 3 results) No results for input(s): PROBNP in the last 8760 hours. HbA1C: No results for input(s): HGBA1C in the last 72 hours. CBG:  Recent Labs Lab 04/09/17 0747 04/09/17 0936 04/09/17 2107 04/10/17 0732 04/10/17 1129  GLUCAP 124* 125* 99 111* 152*   Lipid Profile: No results for input(s): CHOL, HDL, LDLCALC, TRIG, CHOLHDL, LDLDIRECT in the last 72 hours. Thyroid Function Tests: No results for input(s): TSH, T4TOTAL, FREET4, T3FREE, THYROIDAB in the last 72 hours. Anemia Panel: No results for input(s): VITAMINB12, FOLATE, FERRITIN, TIBC, IRON, RETICCTPCT in the last 72 hours. Sepsis Labs:  Recent Labs Lab 04/07/17 1140 04/07/17 1326  LATICACIDVEN 1.0 0.7    Recent Results (from the  past 240 hour(s))  Culture, blood (routine x 2)     Status: None (Preliminary result)   Collection Time: 04/07/17 11:40 AM  Result Value Ref Range Status   Specimen Description BLOOD BLOOD LEFT WRIST  Final   Special Requests   Final    BOTTLES DRAWN AEROBIC AND ANAEROBIC Blood Culture adequate volume   Culture NO GROWTH 3 DAYS  Final   Report Status PENDING  Incomplete  Culture, blood (routine x 2)     Status: None (Preliminary result)   Collection Time: 04/07/17 11:40 AM  Result Value Ref Range Status   Specimen Description BLOOD BLOOD LEFT HAND  Final   Special Requests   Final    BOTTLES DRAWN AEROBIC AND ANAEROBIC Blood Culture adequate volume   Culture NO GROWTH 3 DAYS  Final   Report Status PENDING  Incomplete  Wound or Superficial Culture     Status: Abnormal   Collection Time: 04/07/17  1:35 PM  Result Value Ref Range Status   Specimen Description FOOT  Final   Special Requests Normal  Final   Gram Stain   Final    FEW WBC PRESENT, PREDOMINANTLY PMN MODERATE GRAM POSITIVE COCCI FEW GRAM POSITIVE RODS RARE GRAM NEGATIVE RODS Performed at [facility], [place] [address] [place], [place] [address]    Culture MULTIPLE ORGANISMS PRESENT, NONE PREDOMINANT (A)  Final   Report Status 04/10/2017 FINAL  Final  Surgical PCR screen     Status: None   Collection Time: 04/08/17  9:43 AM  Result Value Ref Range Status   MRSA, PCR NEGATIVE NEGATIVE Final   Staphylococcus aureus NEGATIVE NEGATIVE Final    Comment:        The Xpert SA Assay (FDA approved for NASAL specimens in patients over 88 years of age), is one component of a comprehensive surveillance program.  Test performance has been validated by [facility] for patients greater than or equal to 51 year old. It is not intended to diagnose infection nor to guide or monitor treatment.          Radiology Studies: US Arterial Seg Single  Result Date: 04/10/2017 CLINICAL DATA:  Previous Abnormal lower extremity  ABIs segmental study. Recent left foot foreign body removal. Diabetes, hypertension, hyperlipidemia, previous tobacco abuse. EXAM: NONINVASIVE PHYSIOLOGIC VASCULAR STUDY OF BILATERAL LOWER EXTREMITIES TECHNIQUE: Evaluation of both lower extremities were performed at rest, including calculation of ankle-brachial indices with single level Doppler, pressure recording. COMPARISON:  01/15/2016 FINDINGS: Right ABI:  0.96, stable Left ABI:  0.74 (previously 0.79) Right Lower Extremity: Biphasic arterial waveform in the dorsalis pedis. Left Lower  Extremity:  Monophasic arterial waveforms distally. IMPRESSION: 1. 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 Should the patient fail conservative treatment, consider CTA runoff (higher spatial resolution) or MRA runoff (no radiation risk, can be performed noncontrast in the setting of renal dysfunction) to better define the site and nature of arterial occlusive disease and delineate treatment options. Electronically Signed   By: Lucrezia Europe M.D.   On: 04/10/2017 10:38   Dg Foot 2 Views Left  Result Date: 04/09/2017 CLINICAL DATA:  Foreign body excision LEFT foot EXAM: LEFT FOOT - 2 VIEW COMPARISON:  None FINDINGS: Lateral and oblique lateral views of the LEFT 4 were obtained intraoperatively. The tiny plantar radiopaque foreign body identified at the level of the first MTP joint is no longer seen. Soft tissue lucency is identified compatible with wound/incision. Diffuse soft tissue swelling LEFT foot. Small plantar and Achilles insertion calcaneal spurs. Scattered intertarsal degenerative changes. No fracture, dislocation or bone destruction. IMPRESSION: Tiny plantar foreign body previous identified at the LEFT forefoot is no longer identified. Electronically Signed   By: Lavonia Dana M.D.   On: 04/09/2017 09:22        Scheduled Meds: . acyclovir  800 mg Oral Daily  . aspirin EC  81 mg Oral Daily  . carvedilol  3.125 mg Oral BID  . enoxaparin  (LOVENOX) injection  40 mg Subcutaneous Q24H  . hydrALAZINE  50 mg Oral TID  . insulin aspart  0-5 Units Subcutaneous QHS  . insulin aspart  0-9 Units Subcutaneous TID WC  . insulin aspart  3 Units Subcutaneous TID WC  . lidocaine  10 mL Other Once  . lisinopril  30 mg Oral Daily  . pravastatin  40 mg Oral q1800  . pyridostigmine  60 mg Oral Q8H  . sodium chloride flush  3 mL Intravenous Q12H   Continuous Infusions: . sodium chloride    . piperacillin-tazobactam (ZOSYN)  IV Stopped (04/10/17 1036)  . vancomycin Stopped (04/10/17 0558)     LOS: 3 days    Time spent: [place], MD [facility] Pager [phone]  If 7PM-7AM, please contact night-coverage [email or web address] Password [email or web address] 04/10/2017, 1:42 PM

## 2017-04-10 NOTE — Progress Notes (Signed)
Pharmacy Antibiotic Note  Natalie Contreras is a 51 y.o. female admitted on 04/07/2017 with cellulitis.  Was initially started on Rocephin but d/c'd in favor of Vanc and Zosyn.  SCr has bumped up, D/W Dr Caryn Section, plan to d/c Zosyn and place back on Rocephin.  Will check Vanc trough tomorrow.  Plan: Vancomycin 1000mg  IV q12hrs Check trough level tomorrow in addition to f/u SCr F/u cultures and clinical course  Rocephin 7/23 >>7/24, 7/26 >> Zosyn 7/23 >> 7/26 Vancomycin 7/24 >>  Height: 5\' 4"  (162.6 cm) Weight: 196 lb (88.9 kg) IBW/kg (Calculated) : 54.7  Temp (24hrs), Avg:98.1 F (36.7 C), Min:97.6 F (36.4 C), Max:98.5 F (36.9 C)   Recent Labs Lab 04/07/17 1140 04/07/17 1326 04/08/17 0606 04/09/17 0425 04/10/17 0420  WBC 11.1*  --  7.9 8.6 8.3  CREATININE 0.82  --  1.09* 0.90 1.39*  LATICACIDVEN 1.0 0.7  --   --   --     Estimated Creatinine Clearance: 52.9 mL/min (A) (by C-G formula based on SCr of 1.39 mg/dL (H)).    No Known Allergies  Thank you for allowing pharmacy to be a part of this patient's care.  Hart Robinsons A 04/10/2017 2:05 PM

## 2017-04-10 NOTE — Care Management Note (Signed)
Case Management Note  Patient Details  Name: Natalie Contreras MRN: [id number] Date of Birth: 1966-04-28  Subjective/Objective:                  Pt admitted with cellulitis. She is from home, lives with her daughter. She is ind with ADL's. She has a PCP, transportation and insurance with drug coverage. PT has recommended no HH but use of RW. Pt does not want RW and does not feel she'll need one. She does ask about shower stool. She has no preference of DME agency.   Action/Plan: Referral sent to [place], [facility] rep. He has obtained pt info from chart and delivered shower stool to pt room. Plan for DC home with self care. CM will follow to DC.   Expected Discharge Date:      04/11/2017            Expected Discharge Plan:  Home/Self Care  In-House Referral:  NA  Discharge planning Services  CM Consult  Post Acute Care Choice:  Durable Medical Equipment Choice offered to:  Patient  DME Arranged:  Shower stool DME Agency:  [facility].  Status of Service:  Completed, signed off  Sherald Barge, RN 04/10/2017, 1:29 PM

## 2017-04-10 NOTE — Addendum Note (Signed)
Addendum  created 04/10/17 1453 by Ollen Bowl, CRNA   Sign clinical note

## 2017-04-10 NOTE — Anesthesia Postprocedure Evaluation (Signed)
Anesthesia Post Note  Patient: Natalie Contreras  Procedure(s) Performed: Procedure(s) (LRB): FOREIGN BODY REMOVAL ADULT FOOT (Left)  Patient location during evaluation: Nursing Unit Anesthesia Type: General Level of consciousness: awake and alert and oriented Pain management: pain level controlled Vital Signs Assessment: post-procedure vital signs reviewed and stable Respiratory status: spontaneous breathing Cardiovascular status: blood pressure returned to baseline Postop Assessment: no signs of nausea or vomiting Anesthetic complications: no     Last Vitals:  Vitals:   04/10/17 0525 04/10/17 1439  BP: 140/63 (!) 113/46  Pulse: (!) 58 77  Resp: 16 16  Temp: 36.9 C 36.8 C    Last Pain:  Vitals:   04/10/17 1439  TempSrc: Oral  PainSc:                  Tressie Stalker

## 2017-04-11 ENCOUNTER — Encounter (HOSPITAL_COMMUNITY): Payer: Self-pay | Admitting: General Surgery

## 2017-04-11 DIAGNOSIS — I739 Peripheral vascular disease, unspecified: Secondary | ICD-10-CM

## 2017-04-11 LAB — BASIC METABOLIC PANEL
Anion gap: 8 (ref 5–15)
BUN: 29 mg/dL — AB (ref 6–20)
CHLORIDE: 103 mmol/L (ref 101–111)
CO2: 26 mmol/L (ref 22–32)
CREATININE: 1.63 mg/dL — AB (ref 0.44–1.00)
Calcium: 8.8 mg/dL — ABNORMAL LOW (ref 8.9–10.3)
GFR calc Af Amer: 42 mL/min — ABNORMAL LOW (ref >60)
GFR calc non Af Amer: 36 mL/min — ABNORMAL LOW (ref >60)
Glucose, Bld: 111 mg/dL — ABNORMAL HIGH (ref 65–99)
Potassium: 3.7 mmol/L (ref 3.5–5.1)
Sodium: 137 mmol/L (ref 135–145)

## 2017-04-11 LAB — LIPID PANEL
CHOL/HDL RATIO: 4.4 ratio
CHOLESTEROL: 155 mg/dL (ref 0–200)
HDL: 35 mg/dL — AB (ref >40)
LDL Cholesterol: 104 mg/dL — ABNORMAL HIGH (ref 0–99)
Triglycerides: 79 mg/dL (ref <150)
VLDL: 16 mg/dL (ref 0–40)

## 2017-04-11 LAB — IRON AND TIBC
Iron: 21 ug/dL — ABNORMAL LOW (ref 28–170)
SATURATION RATIOS: 11 % (ref 10.4–31.8)
TIBC: 195 ug/dL — AB (ref 250–450)
UIBC: 174 ug/dL

## 2017-04-11 LAB — GLUCOSE, CAPILLARY
GLUCOSE-CAPILLARY: 242 mg/dL — AB (ref 65–99)
Glucose-Capillary: 119 mg/dL — ABNORMAL HIGH (ref 65–99)
Glucose-Capillary: 181 mg/dL — ABNORMAL HIGH (ref 65–99)
Glucose-Capillary: 214 mg/dL — ABNORMAL HIGH (ref 65–99)

## 2017-04-11 LAB — VITAMIN B12: Vitamin B-12: 440 pg/mL (ref 180–914)

## 2017-04-11 LAB — CBC
HCT: 28.8 % — ABNORMAL LOW (ref 36.0–46.0)
HEMOGLOBIN: 9.4 g/dL — AB (ref 12.0–15.0)
MCH: 29 pg (ref 26.0–34.0)
MCHC: 32.6 g/dL (ref 30.0–36.0)
MCV: 88.9 fL (ref 78.0–100.0)
PLATELETS: 336 10*3/uL (ref 150–400)
RBC: 3.24 MIL/uL — AB (ref 3.87–5.11)
RDW: 14 % (ref 11.5–15.5)
WBC: 8.7 10*3/uL (ref 4.0–10.5)

## 2017-04-11 LAB — VANCOMYCIN, TROUGH: Vancomycin Tr: 39 ug/mL (ref 15–20)

## 2017-04-11 LAB — FERRITIN: Ferritin: 205 ng/mL (ref 11–307)

## 2017-04-11 LAB — TSH: TSH: 1.509 u[IU]/mL (ref 0.350–4.500)

## 2017-04-11 MED ORDER — FUROSEMIDE 10 MG/ML IJ SOLN
20.0000 mg | Freq: Two times a day (BID) | INTRAMUSCULAR | Status: DC
  Administered 2017-04-11 – 2017-04-12 (×3): 20 mg via INTRAVENOUS
  Filled 2017-04-11 (×3): qty 2

## 2017-04-11 MED ORDER — LISINOPRIL 10 MG PO TABS
10.0000 mg | ORAL_TABLET | Freq: Every day | ORAL | Status: DC
  Administered 2017-04-11 – 2017-04-12 (×2): 10 mg via ORAL
  Filled 2017-04-11: qty 1

## 2017-04-11 MED ORDER — NICOTINE 14 MG/24HR TD PT24
14.0000 mg | MEDICATED_PATCH | Freq: Every day | TRANSDERMAL | Status: DC
  Administered 2017-04-11 – 2017-04-15 (×4): 14 mg via TRANSDERMAL
  Filled 2017-04-11 (×5): qty 1

## 2017-04-11 NOTE — Progress Notes (Signed)
Came in patients room to administer her her insulin.  Smelled of cigarette smoke in her room.  Discussed with her that it was against hospital policy to smoke in the rooms.  She verbalized understanding and states that she is not smoking in her room.  Voiced to her that it is not safe to smoke with the Nicotine patch in place.  As well.

## 2017-04-11 NOTE — Progress Notes (Signed)
2 Days Post-Op  Subjective: Patient sitting in chair. Denies any incisional pain.  Objective: Vital signs in last 24 hours: Temp:  [98 F (36.7 C)-98.4 F (36.9 C)] 98 F (36.7 C) (07/27 0603) Pulse Rate:  [63-77] 63 (07/27 0603) Resp:  [16] 16 (07/27 0603) BP: (110-130)/(46-63) 130/63 (07/27 0603) SpO2:  [93 %-100 %] 98 % (07/27 0603) Last BM Date: 04/06/17  Intake/Output from previous day: 07/26 0701 - 07/27 0700 In: 200 [IV Piggyback:200] Out: -  Intake/Output this shift: No intake/output data recorded.  General appearance: alert, cooperative and no distress Extremities: Left foot still slightly swollen. Incision healing well. No purulent drainage noted. Ischemic changes noted in the left second toe. This is new.  Lab Results:   Recent Labs  04/10/17 0420 04/11/17 0511  WBC 8.3 8.7  HGB 9.0* 9.4*  HCT 28.4* 28.8*  PLT 260 336   BMET  Recent Labs  04/10/17 0420 04/11/17 0511  NA 139 137  K 3.7 3.7  CL 103 103  CO2 28 26  GLUCOSE 125* 111*  BUN 28* 29*  CREATININE 1.39* 1.63*  CALCIUM 8.6* 8.8*   PT/INR No results for input(s): LABPROT, INR in the last 72 hours.  Studies/Results: US Arterial Seg Single  Result Date: 04/10/2017 CLINICAL DATA:  Previous Abnormal lower extremity ABIs segmental study. Recent left foot foreign body removal. Diabetes, hypertension, hyperlipidemia, previous tobacco abuse. EXAM: NONINVASIVE PHYSIOLOGIC VASCULAR STUDY OF BILATERAL LOWER EXTREMITIES TECHNIQUE: Evaluation of both lower extremities were performed at rest, including calculation of ankle-brachial indices with single level Doppler, pressure recording. COMPARISON:  01/15/2016 FINDINGS: Right ABI:  0.96, stable Left ABI:  0.74 (previously 0.79) Right Lower Extremity: Biphasic arterial waveform in the dorsalis pedis. Left Lower Extremity:  Monophasic arterial waveforms distally. IMPRESSION: 1. 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 Should the  patient fail conservative treatment, consider CTA runoff (higher spatial resolution) or MRA runoff (no radiation risk, can be performed noncontrast in the setting of renal dysfunction) to better define the site and nature of arterial occlusive disease and delineate treatment options. Electronically Signed   By: Lucrezia Europe M.D.   On: 04/10/2017 10:38    Anti-infectives: Anti-infectives    Start     Dose/Rate Route Frequency Ordered Stop   04/11/17 0800  cefTRIAXone (ROCEPHIN) 1 g in dextrose 5 % 50 mL IVPB     1 g 100 mL/hr over 30 Minutes Intravenous Every 24 hours 04/10/17 1357     04/09/17 0600  vancomycin (VANCOCIN) IVPB 1000 mg/200 mL premix  Status:  Discontinued     1,000 mg 200 mL/hr over 60 Minutes Intravenous Every 12 hours 04/08/17 1306 04/11/17 0914   04/08/17 1500  vancomycin (VANCOCIN) 1,500 mg in sodium chloride 0.9 % 500 mL IVPB     1,500 mg 250 mL/hr over 120 Minutes Intravenous  Once 04/08/17 1306 04/08/17 1908   04/08/17 1400  piperacillin-tazobactam (ZOSYN) IVPB 3.375 g  Status:  Discontinued     3.375 g 12.5 mL/hr over 240 Minutes Intravenous Every 8 hours 04/08/17 1300 04/10/17 1357   04/08/17 1000  cefTRIAXone (ROCEPHIN) 1 g in dextrose 5 % 50 mL IVPB  Status:  Discontinued     1 g 100 mL/hr over 30 Minutes Intravenous Every 24 hours 04/07/17 1714 04/08/17 1245   04/08/17 1000  acyclovir (ZOVIRAX) 200 MG capsule 800 mg     800 mg Oral Daily 04/07/17 1846     04/07/17 1715  acyclovir (ZOVIRAX) tablet 800  mg  Status:  Discontinued     800 mg Oral Daily 04/07/17 1701 04/07/17 1845   04/07/17 1215  cefTRIAXone (ROCEPHIN) 1 g in dextrose 5 % 50 mL IVPB     1 g 100 mL/hr over 30 Minutes Intravenous  Once 04/07/17 1210 04/07/17 1454   04/07/17 1215  piperacillin-tazobactam (ZOSYN) IVPB 3.375 g     3.375 g 100 mL/hr over 30 Minutes Intravenous  Once 04/07/17 1210 04/07/17 1337      Assessment/Plan: s/p Procedure(s): FOREIGN BODY REMOVAL ADULT FOOT Impression:  Patient healing well from warm body removal standpoint. She does have ischemic changes in the left second toe. The blood supply to the toes were not in the area of debridement. We will monitor this. I did review her ABIs. No need for further vascular workup at this time. We will continue to monitor.  LOS: 4 days    Aviva Signs 04/11/2017

## 2017-04-11 NOTE — Progress Notes (Signed)
CRITICAL VALUE ALERT  Critical Value:  Vanc Trough 39  Date & Time Notied: 04/11/2017 0160  Provider Notified: N/A  Orders Received/Actions taken: Vanc not given per previous electronic order.

## 2017-04-11 NOTE — Progress Notes (Signed)
Passed on in report that I was not able to do the daily dressing change on my shift.  Passed on in report to [place] whom stated that she would do the dressing change, if possible.

## 2017-04-11 NOTE — Progress Notes (Addendum)
Pharmacy Antibiotic Note  Natalie Contreras is a 51 y.o. female admitted on 04/07/2017 with cellulitis.  Was initially started on Rocephin but d/c'd in favor of Vanc and Zosyn.  Vancomycin trough elevated at 34mcg/ml and SCr has bumped up even more. D/W Dr Caryn Section and will narrow tx, continue on Rocephin.  Plan: Continue ceftriaxone 1gm IV q24h F/u cultures and clinical course  Height: 5\' 4"  (162.6 cm) Weight: 196 lb (88.9 kg) IBW/kg (Calculated) : 54.7  Temp (24hrs), Avg:98.2 F (36.8 C), Min:98 F (36.7 C), Max:98.4 F (36.9 C)   Recent Labs Lab 04/07/17 1140 04/07/17 1326 04/08/17 0606 04/09/17 0425 04/10/17 0420 04/11/17 0511  WBC 11.1*  --  7.9 8.6 8.3 8.7  CREATININE 0.82  --  1.09* 0.90 1.39* 1.63*  LATICACIDVEN 1.0 0.7  --   --   --   --   VANCOTROUGH  --   --   --   --   --  39*    Estimated Creatinine Clearance: 45.1 mL/min (A) (by C-G formula based on SCr of 1.63 mg/dL (H)).    No Known Allergies  Antibiotics: Rocephin 7/23 >>7/24, 7/26 >> Zosyn 7/23 >> 7/26 Vancomycin 7/24 >>7/27  Microbiology Cxs: 7/23 BCx: ngtd 7/23 Wound Cx: FEW WBC PRESENT, mostly PMN , mod GPC, few GPR, rare GNR--> Multiple organisms present, No predominant organisms. 7/24 MRSA PCR is negative  Thank you for allowing pharmacy to be a part of this patient's care.  Isac Sarna, BS Pharm D, BCPS Clinical Pharmacist Pager [phone] 04/11/2017 9:15 AM

## 2017-04-11 NOTE — Progress Notes (Addendum)
Inpatient Diabetes Program Recommendations  AACE/ADA: New Consensus Statement on Inpatient Glycemic Control (2015)  Target Ranges:  Prepandial:   less than 140 mg/dL      Peak postprandial:   less than 180 mg/dL (1-2 hours)      Critically ill patients:  140 - 180 mg/dL  Results for ARLYCE, CIRCLE (MRN [id number]) as of 04/11/2017 08:18  Ref. Range 04/10/2017 07:32 04/10/2017 11:29 04/10/2017 16:20 04/10/2017 17:40 04/10/2017 20:18 04/11/2017 07:50  Glucose-Capillary Latest Ref Range: 65 - 99 mg/dL 111 (H) 152 (H) 66 118 (H) 183 (H) 119 (H)    Review of Glycemic Control  Diabetes history: DM2 Outpatient Diabetes medications: Amaryl 2 mg daily, Metformin 1000 mg daily Current orders for Inpatient glycemic control: Novolog 0-9 units TID with meals, Novolog 0-5 units QHS, Novolog 3 TID with meals for meal coverage  Inpatient Diabetes Program Recommendations: Insulin - Meal Coverage: Glucose down to 66 mg/dl yesterday at 16:20 after getting Novolog meal coverage for lunch yesterday. May want to consider discontinuing meal coverage.  Thanks, Barnie Alderman, RN, MSN, CDE Diabetes Coordinator Inpatient Diabetes Program [phone] (Team Pager from 8am to 5pm)

## 2017-04-11 NOTE — Progress Notes (Signed)
PROGRESS NOTE    Natalie Contreras  AOZ:[id number] DOB: [birth year] DOA: 04/07/2017 PCP: [name], MD    Brief Narrative:  [name] with DM-2 who presented with a left foot injury after apparently accidentally stepping on a piece of glass at work. She did not notice or feel the injury until the following few days when she saw blood in her shoe. 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 Her foot became red, swollen, and discolored. In the ED, she was afebrile and hemodynamically stable. Her lab data revealed:  sodium 140, potassium 3.4, chloride 101, bicarbonate 28, glucose 118, BUN27, creatinine 0.82, white count 11.1, hemoglobin 10.9, hematocrit 33.5, platelets 316, urinalysis negative for infection. Foot x-ray revealed stable appearing retained foreign object along the plantar aspect. Chest x-ray was negative for infiltrates. She was admitted for evaluation and management of left foot cellulitis.  Assessment & Plan:   Principal Problem:   Cellulitis of left foot Active Problems:   PAD (peripheral artery disease) (HCC)   Diabetes mellitus with neuropathy (HCC)   Chronic diastolic CHF (congestive heart failure) (HCC)   AKI (acute kidney injury) ([place])   Foreign body in left foot   Essential hypertension   1. Left foot cellulitis associated with left foot foreign body. The patient was given Rocephin 1 in the ED and subsequently started on vancomycin. Supportive treatment and gentle IV fluids were given. -Zosyn was added to vancomycin for treatment. -General surgeon, Dr. Arnoldo Morale was consulted for removal of the foreign object from her left foot. She was taken to the OR  on 7/25 with successful removal of the foreign object. -ABI was ordered by Dr. Arnoldo Morale and it revealed PAD of the left lower extremity with an ABI of 0.74. -Wound culture prior to the operation revealed multiple organisms with none predominate. Blood cultures have remained negative to date. She is  afebrile and her white blood cell count has normalized. --Antibiotic narrowed from Zosyn and vancomycin to Rocephin for monotherapy.  PAD Noted per arterial Dopplers. Patient also has a history of nonobstructive coronary artery disease. -Aspirin was restarted. -Fasting lipid profile revealed a total cholesterol of 155, HDL of 35, and LDL of 104. We'll continue pravastatin.  Acute on chronic diastolic heart failure. The patient is treated with torsemide chronically. It was held on admission. She was started on gentle IV fluids 48 hours. -She has worsening bilateral lower extremity edema. -We'll start Lasix 20 mg IV every 12 hours. Strict ins and outs and daily weights ordered.  Acute kidney injury. Patient has no history of chronic kidney disease. Her creatinine was within normal limits on admission. -She was given IV fluids 24 hours. -Creatinine trended up to 1.39 on 7/26. It is 1.63 today. -In light of the lower extremity edema and diastolic heart failure, Lasix will be started as stated above. -Decreased the dose of fosinopril from 30 to 10 mg daily. -The etiology is unclear, but could be ATN, vancomycin induced (trough level 39 on 7/27), or from heart failure. -We'll order urine sodium, potassium, creatinine, chloride for further evaluation. Burnis Medin consult nephrology for 7/28.  Essential hypertension. Patient is treated chronically with carvedilol, hydralazine, and lisinopril. All were continued on admission. -Her blood pressure is stable and controlled. -Due to AKI, the dose was in a low was decreased.  Type 2 diabetes with neuropathy. Patient is treated chronically with glimeperide, glipizide, and metformin. Her oral agents were withheld and sliding scale NovoLog was started. -Her CBGs have been relatively controlled,  but has trended toward the hypoglycemic range. -Mealtime NovoLog was discontinued. - A1c pending.  Normocytic anemia. Patient's hemoglobin was 10.9 on  admission and has drifted down to 9.5>>9.0. -Patient appears to have chronic normocytic anemia, likely of chronic disease with an element of mild acute blood loss from the operation. -Anemia studies ordered and revealed a low total iron of 21, TIBC 195, ferritin 205, and B12 of 440. TSH was within normal limits at 1.5. -We'll start ferrous sulfate at the time of discharge. Her hemoglobin has been stable for the past 48 hours.    DVT prophylaxis: Lovenox Code Status: Full code Family Communication: Family not available Disposition Plan: Discharge to home with clinically appropriate   Consultants:   Gen Surgery  Procedures:   ABI-base left ABI 0.74 (previously 0.79). Right ABI 0.96-stable. 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  Removal of foreign body, left foot, Dr. Arnoldo Morale, 04/09/17.  Antimicrobials:   Zosyn>>7/25  Vancomycin 7/23>>7/26  Rocephin 7/23 x 1 in ED>>restart 7/26>>   Subjective: Patient has no complaints of nausea, vomiting, or diarrhea. She believes her urine output has been about the same. She  Objective: Vitals:   04/10/17 1439 04/10/17 2025 04/10/17 2202 04/11/17 0603  BP: (!) 113/46  (!) 110/58 130/63  Pulse: 77  68 63  Resp: 16  16 16   Temp: 98.3 F (36.8 C)  98.4 F (36.9 C) 98 F (36.7 C)  TempSrc: Oral  Oral Oral  SpO2: 100% 93% 94% 98%  Weight:      Height:        Intake/Output Summary (Last 24 hours) at 04/11/17 1253 Last data filed at 04/10/17 1827  Gross per 24 hour  Intake              200 ml  Output                0 ml  Net              200 ml   Filed Weights   04/07/17 0954 04/07/17 1839 04/09/17 0751  Weight: 88.9 kg (196 lb) 88.9 kg (196 lb) 88.9 kg (196 lb)    Examination:  General exam: Appears calm and comfortable  Respiratory system: Clear Anteriorly and decreased breath sounds in the bases. Respiratory effort normal. Cardiovascular system: S1 & S2 heard, with a 2/6 systolic murmur.. 3+  bilateral lower extremity pitting edema. Gastrointestinal system: Abdomen is obese, nondistended, soft and nontender. No organomegaly or masses felt. Normal bowel sounds heard. Central nervous system: Alert and oriented. No focal neurological deficits. Skin: Left foot with plantar incision healing; no early drainage; mild edema around the incision site; ischemic bluish tent to the left second toe. Psychiatry: Judgement and insight appear normal. Mood & affect appropriate.     Data Reviewed: I have personally reviewed following labs and imaging studies  CBC:  Recent Labs Lab 04/07/17 1140 04/08/17 0606 04/09/17 0425 04/10/17 0420 04/11/17 0511  WBC 11.1* 7.9 8.6 8.3 8.7  NEUTROABS 8.2*  --  5.4  --   --   HGB 10.9* 9.5* 9.2* 9.0* 9.4*  HCT 33.5* 29.3* 27.9* 28.4* 28.8*  MCV 89.6 89.1 88.9 91.3 88.9  PLT 316 269 263 260 734   Basic Metabolic Panel:  Recent Labs Lab 04/07/17 1140 04/08/17 0606 04/09/17 0425 04/10/17 0420 04/11/17 0511  NA 140 140 138 139 137  K 3.4* 3.5 3.3* 3.7 3.7  CL 101 103 102 103 103  CO2 28 28  27 28 26   GLUCOSE 118* 68 105* 125* 111*  BUN 27* 28* 26* 28* 29*  CREATININE 0.82 1.09* 0.90 1.39* 1.63*  CALCIUM 8.9 8.4* 8.3* 8.6* 8.8*   GFR: Estimated Creatinine Clearance: 45.1 mL/min (A) (by C-G formula based on SCr of 1.63 mg/dL (H)). Liver Function Tests:  Recent Labs Lab 04/08/17 0606  AST 30  ALT 30  ALKPHOS 140*  BILITOT 0.5  PROT 5.7*  ALBUMIN 2.4*   No results for input(s): LIPASE, AMYLASE in the last 168 hours. No results for input(s): AMMONIA in the last 168 hours. Coagulation Profile: No results for input(s): INR, PROTIME in the last 168 hours. Cardiac Enzymes: No results for input(s): CKTOTAL, CKMB, CKMBINDEX, TROPONINI in the last 168 hours. BNP (last 3 results) No results for input(s): PROBNP in the last 8760 hours. HbA1C: No results for input(s): HGBA1C in the last 72 hours. CBG:  Recent Labs Lab 04/10/17 1620  04/10/17 1740 04/10/17 2018 04/11/17 0750 04/11/17 1134  GLUCAP 66 118* 183* 119* 181*   Lipid Profile:  Recent Labs  04/11/17 0511  CHOL 155  HDL 35*  LDLCALC 104*  TRIG 79  CHOLHDL 4.4   Thyroid Function Tests:  Recent Labs  04/11/17 0511  TSH 1.509   Anemia Panel: No results for input(s): VITAMINB12, FOLATE, FERRITIN, TIBC, IRON, RETICCTPCT in the last 72 hours. Sepsis Labs:  Recent Labs Lab 04/07/17 1140 04/07/17 1326  LATICACIDVEN 1.0 0.7    Recent Results (from the past 240 hour(s))  Culture, blood (routine x 2)     Status: None (Preliminary result)   Collection Time: 04/07/17 11:40 AM  Result Value Ref Range Status   Specimen Description BLOOD BLOOD LEFT WRIST  Final   Special Requests   Final    BOTTLES DRAWN AEROBIC AND ANAEROBIC Blood Culture adequate volume   Culture NO GROWTH 4 DAYS  Final   Report Status PENDING  Incomplete  Culture, blood (routine x 2)     Status: None (Preliminary result)   Collection Time: 04/07/17 11:40 AM  Result Value Ref Range Status   Specimen Description BLOOD BLOOD LEFT HAND  Final   Special Requests   Final    BOTTLES DRAWN AEROBIC AND ANAEROBIC Blood Culture adequate volume   Culture NO GROWTH 4 DAYS  Final   Report Status PENDING  Incomplete  Wound or Superficial Culture     Status: Abnormal   Collection Time: 04/07/17  1:35 PM  Result Value Ref Range Status   Specimen Description FOOT  Final   Special Requests Normal  Final   Gram Stain   Final    FEW WBC PRESENT, PREDOMINANTLY PMN MODERATE GRAM POSITIVE COCCI FEW GRAM POSITIVE RODS RARE GRAM NEGATIVE RODS Performed at [facility], [place] [address], [place], [place] [address]    Culture MULTIPLE ORGANISMS PRESENT, NONE PREDOMINANT (A)  Final   Report Status 04/10/2017 FINAL  Final  Surgical PCR screen     Status: None   Collection Time: 04/08/17  9:43 AM  Result Value Ref Range Status   MRSA, PCR NEGATIVE NEGATIVE Final   Staphylococcus aureus  NEGATIVE NEGATIVE Final    Comment:        The Xpert SA Assay (FDA approved for NASAL specimens in patients over 31 years of age), is one component of a comprehensive surveillance program.  Test performance has been validated by [facility] for patients greater than or equal to 57 year old. It is not intended to diagnose  infection nor to guide or monitor treatment.          Radiology Studies: US Arterial Seg Single  Result Date: 04/10/2017 CLINICAL DATA:  Previous Abnormal lower extremity ABIs segmental study. Recent left foot foreign body removal. Diabetes, hypertension, hyperlipidemia, previous tobacco abuse. EXAM: NONINVASIVE PHYSIOLOGIC VASCULAR STUDY OF BILATERAL LOWER EXTREMITIES TECHNIQUE: Evaluation of both lower extremities were performed at rest, including calculation of ankle-brachial indices with single level Doppler, pressure recording. COMPARISON:  01/15/2016 FINDINGS: Right ABI:  0.96, stable Left ABI:  0.74 (previously 0.79) Right Lower Extremity: Biphasic arterial waveform in the dorsalis pedis. Left Lower Extremity:  Monophasic arterial waveforms distally. IMPRESSION: 1. 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 Should the patient fail conservative treatment, consider CTA runoff (higher spatial resolution) or MRA runoff (no radiation risk, can be performed noncontrast in the setting of renal dysfunction) to better define the site and nature of arterial occlusive disease and delineate treatment options. Electronically Signed   By: Lucrezia Europe M.D.   On: 04/10/2017 10:38        Scheduled Meds: . acyclovir  800 mg Oral Daily  . aspirin EC  81 mg Oral Daily  . carvedilol  3.125 mg Oral BID  . enoxaparin (LOVENOX) injection  40 mg Subcutaneous Q24H  . furosemide  20 mg Intravenous BID  . hydrALAZINE  50 mg Oral TID  . insulin aspart  0-5 Units Subcutaneous QHS  . insulin aspart  0-9 Units Subcutaneous TID WC  . lidocaine  10 mL Other Once  .  lisinopril  10 mg Oral Daily  . nicotine  14 mg Transdermal Daily  . pravastatin  40 mg Oral q1800  . pyridostigmine  60 mg Oral Q8H  . sodium chloride flush  3 mL Intravenous Q12H   Continuous Infusions: . sodium chloride    . cefTRIAXone (ROCEPHIN)  IV 1 g (04/11/17 1024)     LOS: 4 days    Time spent: [place], MD [facility] Pager [phone]  If 7PM-7AM, please contact night-coverage [email or web address] Password [facility] 04/11/2017, 12:53 PM

## 2017-04-12 LAB — CBC
HEMATOCRIT: 27.9 % — AB (ref 36.0–46.0)
HEMOGLOBIN: 9.4 g/dL — AB (ref 12.0–15.0)
MCH: 29.7 pg (ref 26.0–34.0)
MCHC: 33.7 g/dL (ref 30.0–36.0)
MCV: 88.3 fL (ref 78.0–100.0)
Platelets: 304 10*3/uL (ref 150–400)
RBC: 3.16 MIL/uL — AB (ref 3.87–5.11)
RDW: 14.1 % (ref 11.5–15.5)
WBC: 8.4 10*3/uL (ref 4.0–10.5)

## 2017-04-12 LAB — BASIC METABOLIC PANEL
Anion gap: 9 (ref 5–15)
BUN: 32 mg/dL — AB (ref 6–20)
CHLORIDE: 103 mmol/L (ref 101–111)
CO2: 26 mmol/L (ref 22–32)
CREATININE: 1.78 mg/dL — AB (ref 0.44–1.00)
Calcium: 8.4 mg/dL — ABNORMAL LOW (ref 8.9–10.3)
GFR calc non Af Amer: 32 mL/min — ABNORMAL LOW (ref >60)
GFR, EST AFRICAN AMERICAN: 38 mL/min — AB (ref >60)
Glucose, Bld: 174 mg/dL — ABNORMAL HIGH (ref 65–99)
POTASSIUM: 3.5 mmol/L (ref 3.5–5.1)
Sodium: 138 mmol/L (ref 135–145)

## 2017-04-12 LAB — CHLORIDE, URINE, RANDOM: Chloride Urine: <15 mmol/L

## 2017-04-12 LAB — CULTURE, BLOOD (ROUTINE X 2)
CULTURE: NO GROWTH
Culture: NO GROWTH
SPECIAL REQUESTS: ADEQUATE
Special Requests: ADEQUATE

## 2017-04-12 LAB — HEMOGLOBIN A1C
HEMOGLOBIN A1C: 6.7 % — AB (ref 4.8–5.6)
MEAN PLASMA GLUCOSE: 146 mg/dL

## 2017-04-12 LAB — CREATININE, URINE, RANDOM: Creatinine, Urine: 150.13 mg/dL

## 2017-04-12 LAB — GLUCOSE, CAPILLARY
GLUCOSE-CAPILLARY: 260 mg/dL — AB (ref 65–99)
Glucose-Capillary: 148 mg/dL — ABNORMAL HIGH (ref 65–99)
Glucose-Capillary: 178 mg/dL — ABNORMAL HIGH (ref 65–99)
Glucose-Capillary: 216 mg/dL — ABNORMAL HIGH (ref 65–99)

## 2017-04-12 LAB — NA AND K (SODIUM & POTASSIUM), RAND UR
Potassium Urine: 25 mmol/L
Sodium, Ur: 25 mmol/L

## 2017-04-12 MED ORDER — POTASSIUM CHLORIDE IN NACL 20-0.45 MEQ/L-% IV SOLN
INTRAVENOUS | Status: DC
  Administered 2017-04-12 – 2017-04-14 (×4): via INTRAVENOUS
  Filled 2017-04-12 (×7): qty 1000

## 2017-04-12 MED ORDER — FUROSEMIDE 10 MG/ML IJ SOLN
30.0000 mg | Freq: Two times a day (BID) | INTRAMUSCULAR | Status: DC
  Administered 2017-04-12 – 2017-04-14 (×5): 30 mg via INTRAVENOUS
  Filled 2017-04-12 (×5): qty 4

## 2017-04-12 MED ORDER — BACITRACIN-NEOMYCIN-POLYMYXIN 400-5-5000 EX OINT
TOPICAL_OINTMENT | Freq: Two times a day (BID) | CUTANEOUS | Status: DC
  Administered 2017-04-12 – 2017-04-14 (×7): 1 via TOPICAL
  Filled 2017-04-12 (×7): qty 1

## 2017-04-12 NOTE — Progress Notes (Signed)
Dressing changed as ordered.  Patient stated she did not want the TED hose back on the left leg.  She was educated about the importance but stated it felt better off.

## 2017-04-12 NOTE — Progress Notes (Signed)
3 Days Post-Op  Subjective: No complaints of left foot pain.  Objective: Vital signs in last 24 hours: Temp:  [98.1 F (36.7 C)-99.5 F (37.5 C)] 98.3 F (36.8 C) (07/28 0834) Pulse Rate:  [63-76] 64 (07/28 0834) Resp:  [16-18] 18 (07/28 0834) BP: (122-154)/(44-65) 154/45 (07/28 0834) SpO2:  [92 %-98 %] 93 % (07/28 0630) Weight:  [211 lb 6.4 oz (95.9 kg)] 211 lb 6.4 oz (95.9 kg) (07/28 0500) Last BM Date: 04/11/17  Intake/Output from previous day: 07/27 0701 - 07/28 0700 In: 1048 [P.O.:1021; I.V.:27] Out: -  Intake/Output this shift: Total I/O In: -  Out: 300 [Urine:300]  General appearance: alert, cooperative and no distress Extremities: Left foot and lower extremity swollen. Left second toe with blister present distally. No open ulcerations.  Lab Results:   Recent Labs  04/11/17 0511 04/12/17 0442  WBC 8.7 8.4  HGB 9.4* 9.4*  HCT 28.8* 27.9*  PLT 336 304   BMET  Recent Labs  04/11/17 0511 04/12/17 0442  NA 137 138  K 3.7 3.5  CL 103 103  CO2 26 26  GLUCOSE 111* 174*  BUN 29* 32*  CREATININE 1.63* 1.78*  CALCIUM 8.8* 8.4*   PT/INR No results for input(s): LABPROT, INR in the last 72 hours.  Studies/Results: US Arterial Seg Single  Result Date: 04/10/2017 CLINICAL DATA:  Previous Abnormal lower extremity ABIs segmental study. Recent left foot foreign body removal. Diabetes, hypertension, hyperlipidemia, previous tobacco abuse. EXAM: NONINVASIVE PHYSIOLOGIC VASCULAR STUDY OF BILATERAL LOWER EXTREMITIES TECHNIQUE: Evaluation of both lower extremities were performed at rest, including calculation of ankle-brachial indices with single level Doppler, pressure recording. COMPARISON:  01/15/2016 FINDINGS: Right ABI:  0.96, stable Left ABI:  0.74 (previously 0.79) Right Lower Extremity: Biphasic arterial waveform in the dorsalis pedis. Left Lower Extremity:  Monophasic arterial waveforms distally. IMPRESSION: 1. Moderate left lower extremity arterial occlusive  disease at rest. Should the patient fail conservative treatment, consider CTA runoff (higher spatial resolution) or MRA runoff (no radiation risk, can be performed noncontrast in the setting of renal dysfunction) to better define the site and nature of arterial occlusive disease and delineate treatment options. Electronically Signed   By: Lucrezia Europe M.D.   On: 04/10/2017 10:38    Anti-infectives: Anti-infectives    Start     Dose/Rate Route Frequency Ordered Stop   04/11/17 0800  cefTRIAXone (ROCEPHIN) 1 g in dextrose 5 % 50 mL IVPB     1 g 100 mL/hr over 30 Minutes Intravenous Every 24 hours 04/10/17 1357     04/09/17 0600  vancomycin (VANCOCIN) IVPB 1000 mg/200 mL premix  Status:  Discontinued     1,000 mg 200 mL/hr over 60 Minutes Intravenous Every 12 hours 04/08/17 1306 04/11/17 0914   04/08/17 1500  vancomycin (VANCOCIN) 1,500 mg in sodium chloride 0.9 % 500 mL IVPB     1,500 mg 250 mL/hr over 120 Minutes Intravenous  Once 04/08/17 1306 04/08/17 1908   04/08/17 1400  piperacillin-tazobactam (ZOSYN) IVPB 3.375 g  Status:  Discontinued     3.375 g 12.5 mL/hr over 240 Minutes Intravenous Every 8 hours 04/08/17 1300 04/10/17 1357   04/08/17 1000  cefTRIAXone (ROCEPHIN) 1 g in dextrose 5 % 50 mL IVPB  Status:  Discontinued     1 g 100 mL/hr over 30 Minutes Intravenous Every 24 hours 04/07/17 1714 04/08/17 1245   04/08/17 1000  acyclovir (ZOVIRAX) 200 MG capsule 800 mg     800 mg Oral Daily 04/07/17 1846  04/07/17 1715  acyclovir (ZOVIRAX) tablet 800 mg  Status:  Discontinued     800 mg Oral Daily 04/07/17 1701 04/07/17 1845   04/07/17 1215  cefTRIAXone (ROCEPHIN) 1 g in dextrose 5 % 50 mL IVPB     1 g 100 mL/hr over 30 Minutes Intravenous  Once 04/07/17 1210 04/07/17 1454   04/07/17 1215  piperacillin-tazobactam (ZOSYN) IVPB 3.375 g     3.375 g 100 mL/hr over 30 Minutes Intravenous  Once 04/07/17 1210 04/07/17 1337      Assessment/Plan: s/p Procedure(s): FOREIGN BODY REMOVAL  ADULT FOOT Impression: Left foot abscess resolving. Ischemic changes and left second toe which are not critical at this time. Patient still has renal insufficiency. Being addressed by hospitalist.  LOS: 5 days    Aviva Signs 04/12/2017

## 2017-04-12 NOTE — Progress Notes (Signed)
PROGRESS NOTE    Natalie Contreras  MWU:[id number] DOB: 1965-11-21 DOA: 04/07/2017 PCP: [name], MD    Brief Narrative:  [name] with DM-2 who presented with a left foot injury after apparently accidentally stepping on a piece of glass at work. She did not notice or feel the injury until the following few days when she saw blood in her shoe. 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 Her foot became red, swollen, and discolored. In the ED, she was afebrile and hemodynamically stable. Her lab data revealed:  sodium 140, potassium 3.4, chloride 101, bicarbonate 28, glucose 118, BUN27, creatinine 0.82, white count 11.1, hemoglobin 10.9, hematocrit 33.5, platelets 316, urinalysis negative for infection. Foot x-ray revealed stable appearing retained foreign object along the plantar aspect. Chest x-ray was negative for infiltrates. She was admitted for evaluation and management of left foot cellulitis.  Assessment & Plan:   Principal Problem:   Cellulitis of left foot Active Problems:   PAD (peripheral artery disease) (HCC)   Diabetes mellitus with neuropathy (HCC)   Chronic diastolic CHF (congestive heart failure) (HCC)   AKI (acute kidney injury) ([place])   Foreign body in left foot   Essential hypertension   1. Left foot cellulitis with abscess associated with left foot foreign body. The patient was given Rocephin 1 in the ED and subsequently started on vancomycin. Supportive treatment and gentle IV fluids were given. -Zosyn was added to vancomycin for treatment. -General surgeon, Dr. Arnoldo Morale was consulted for removal of the foreign object from her left foot. She was taken to the OR  on 7/25 with successful removal of the foreign object. -ABI was ordered by Dr. Arnoldo Morale and it revealed PAD of the left lower extremity with an ABI of 0.74. -Wound culture prior to the operation revealed multiple organisms with none predominate. Blood cultures have remained negative to date.  She is afebrile and her white blood cell count has normalized. --Antibiotic narrowed from Zosyn and vancomycin to Rocephin for monotherapy. -The left foot cellulitis/abscess is resolving and appears to be healing well. Discussed with Dr. Arnoldo Morale.  PAD/ischemic changes left second toe Noted per arterial Dopplers. Patient also has a history of nonobstructive coronary artery disease. -Patient has ischemic changes of the distal left second toe. This was noted by Dr. Arnoldo Morale. He recommends watchful observation, but notes that the patient may eventually lose that toe. -Aspirin was restarted. -Fasting lipid profile revealed a total cholesterol of 155, HDL of 35, and LDL of 104. We'll continue pravastatin.  Acute on chronic diastolic heart failure. The patient is treated with torsemide chronically. It was held on admission. She was started on gentle IV fluids 48 hours. -She developed worsening bilateral lower extremity edema on 7/27. -Lasix 20 mg IV every 12 hours was started on 7/27. Compression stockings were added.. -There is a small improvement in the edema.  Acute kidney injury. Patient has no history of chronic kidney disease. Her creatinine was within normal limits on admission. -She was given IV fluids 24 hours. -Creatinine trended up to 1.39 on 7/26>>1.63 >>1.78 today. Not sure if the urine output recorded as accurate. -In light of the lower extremity edema and diastolic heart failure, Lasix was started as stated above. -We'll stop the lisinopril altogether after it was decreased to 10 mg daily. -Urine indices ordered and noted. -Nephrology consult pending. -In the meantime, will at half-normal saline at 100 cc an hour and increase Lasix to 30 mg every 12 hours, pending nephrology consultation.  Essential hypertension. Patient is treated chronically with carvedilol, hydralazine, and lisinopril. All were continued on admission. -Lisinopril was discontinued 7/28 due to acute kidney  injury. -Her blood pressure has been stable. If her blood pressure trends up off of lisinopril, will increase the dose of carvedilol or hydralazine as tolerated.  Type 2 diabetes with neuropathy. Patient is treated chronically with glimeperide, glipizide, and metformin. Her oral agents were withheld and sliding scale NovoLog was started. -Her CBGs have been relatively controlled, but has trended toward the hypoglycemic range. -Mealtime NovoLog was discontinued. - Her A1c was 6.7.  Normocytic anemia. Patient's hemoglobin was 10.9 on admission and has drifted down to 9.5>>9.0. -Patient appears to have chronic normocytic anemia, likely of chronic disease with an element of mild acute blood loss from the operation. -Anemia studies ordered and revealed a low total iron of 21, TIBC 195, ferritin 205, and B12 of 440. TSH was within normal limits at 1.5. -We'll start ferrous sulfate. Her hemoglobin has been stable ranging from 9.0-9.4.    DVT prophylaxis: Lovenox Code Status: Full code Family Communication: Family not available Disposition Plan: Discharge to home with clinically appropriate   Consultants:   Gen Surgery  Procedures:   ABI-base left ABI 0.74 (previously 0.79). Right ABI 0.96-stable. 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  Removal of foreign body, left foot, Dr. Arnoldo Morale, 04/09/17.  Antimicrobials:   Zosyn>>7/25  Vancomycin 7/23>>7/26  Rocephin 7/23 x 1 in ED>>restart 7/26>>   Subjective: Patient reports urinating near normal. She denies foot pain at rest. She denies shortness of breath. She says that the compression stockings are helping with the fluid in her legs.  Objective: Vitals:   04/11/17 2100 04/12/17 0500 04/12/17 0630 04/12/17 0834  BP: (!) 128/44  (!) 135/49 (!) 154/45  Pulse: 76  63 64  Resp: 16  18 18   Temp: 99.5 F (37.5 C)  98.3 F (36.8 C) 98.3 F (36.8 C)  TempSrc: Oral  Oral Oral  SpO2: 92%  93%   Weight:  95.9  kg (211 lb 6.4 oz)    Height:        Intake/Output Summary (Last 24 hours) at 04/12/17 1627 Last data filed at 04/12/17 3762  Gross per 24 hour  Intake              667 ml  Output              300 ml  Net              367 ml   Filed Weights   04/07/17 1839 04/09/17 0751 04/12/17 0500  Weight: 88.9 kg (196 lb) 88.9 kg (196 lb) 95.9 kg (211 lb 6.4 oz)    Examination:  General exam: Appears calm and comfortable  Respiratory system: Clear Anteriorly and decreased breath sounds in the bases. Respiratory effort normal. Cardiovascular system: S1 & S2 heard, with a 2/6 systolic murmur.. 3+ bilateral lower extremity pitting edema-Slight decrease from yesterday.. Gastrointestinal system: Abdomen is obese, nondistended, soft and nontender. No organomegaly or masses felt. Normal bowel sounds heard. Central nervous system: Alert and oriented. No focal neurological deficits. Skin: Left foot with plantar incision healing; no early drainage; mild edema around the incision site;  bluish hue of the left second toe. Psychiatry: Judgement and insight appear normal. Mood & affect appropriate.     Data Reviewed: I have personally reviewed following labs and imaging studies  CBC:  Recent Labs Lab 04/07/17 1140 04/08/17 0606 04/09/17 0425  04/10/17 0420 04/11/17 0511 04/12/17 0442  WBC 11.1* 7.9 8.6 8.3 8.7 8.4  NEUTROABS 8.2*  --  5.4  --   --   --   HGB 10.9* 9.5* 9.2* 9.0* 9.4* 9.4*  HCT 33.5* 29.3* 27.9* 28.4* 28.8* 27.9*  MCV 89.6 89.1 88.9 91.3 88.9 88.3  PLT 316 269 263 260 336 030   Basic Metabolic Panel:  Recent Labs Lab 04/08/17 0606 04/09/17 0425 04/10/17 0420 04/11/17 0511 04/12/17 0442  NA 140 138 139 137 138  K 3.5 3.3* 3.7 3.7 3.5  CL 103 102 103 103 103  CO2 28 27 28 26 26   GLUCOSE 68 105* 125* 111* 174*  BUN 28* 26* 28* 29* 32*  CREATININE 1.09* 0.90 1.39* 1.63* 1.78*  CALCIUM 8.4* 8.3* 8.6* 8.8* 8.4*   GFR: Estimated Creatinine Clearance: 43 mL/min (A) (by  C-G formula based on SCr of 1.78 mg/dL (H)). Liver Function Tests:  Recent Labs Lab 04/08/17 0606  AST 30  ALT 30  ALKPHOS 140*  BILITOT 0.5  PROT 5.7*  ALBUMIN 2.4*   No results for input(s): LIPASE, AMYLASE in the last 168 hours. No results for input(s): AMMONIA in the last 168 hours. Coagulation Profile: No results for input(s): INR, PROTIME in the last 168 hours. Cardiac Enzymes: No results for input(s): CKTOTAL, CKMB, CKMBINDEX, TROPONINI in the last 168 hours. BNP (last 3 results) No results for input(s): PROBNP in the last 8760 hours. HbA1C:  Recent Labs  04/11/17 0511  HGBA1C 6.7*   CBG:  Recent Labs Lab 04/11/17 1617 04/11/17 2142 04/12/17 0743 04/12/17 1127 04/12/17 1626  GLUCAP 242* 214* 148* 260* 178*   Lipid Profile:  Recent Labs  04/11/17 0511  CHOL 155  HDL 35*  LDLCALC 104*  TRIG 79  CHOLHDL 4.4   Thyroid Function Tests:  Recent Labs  04/11/17 0511  TSH 1.509   Anemia Panel:  Recent Labs  04/11/17 0511  VITAMINB12 440  FERRITIN 205  TIBC 195*  IRON 21*   Sepsis Labs:  Recent Labs Lab 04/07/17 1140 04/07/17 1326  LATICACIDVEN 1.0 0.7    Recent Results (from the past 240 hour(s))  Culture, blood (routine x 2)     Status: None   Collection Time: 04/07/17 11:40 AM  Result Value Ref Range Status   Specimen Description BLOOD BLOOD LEFT WRIST  Final   Special Requests   Final    BOTTLES DRAWN AEROBIC AND ANAEROBIC Blood Culture adequate volume   Culture NO GROWTH 5 DAYS  Final   Report Status 04/12/2017 FINAL  Final  Culture, blood (routine x 2)     Status: None   Collection Time: 04/07/17 11:40 AM  Result Value Ref Range Status   Specimen Description BLOOD BLOOD LEFT HAND  Final   Special Requests   Final    BOTTLES DRAWN AEROBIC AND ANAEROBIC Blood Culture adequate volume   Culture NO GROWTH 5 DAYS  Final   Report Status 04/12/2017 FINAL  Final  Wound or Superficial Culture     Status: Abnormal   Collection Time:  04/07/17  1:35 PM  Result Value Ref Range Status   Specimen Description FOOT  Final   Special Requests Normal  Final   Gram Stain   Final    FEW WBC PRESENT, PREDOMINANTLY PMN MODERATE GRAM POSITIVE COCCI FEW GRAM POSITIVE RODS RARE GRAM NEGATIVE RODS Performed at [facility], [place] [address], [place], [place] [address]    Culture MULTIPLE ORGANISMS PRESENT, NONE  PREDOMINANT (A)  Final   Report Status 04/10/2017 FINAL  Final  Surgical PCR screen     Status: None   Collection Time: 04/08/17  9:43 AM  Result Value Ref Range Status   MRSA, PCR NEGATIVE NEGATIVE Final   Staphylococcus aureus NEGATIVE NEGATIVE Final    Comment:        The Xpert SA Assay (FDA approved for NASAL specimens in patients over 24 years of age), is one component of a comprehensive surveillance program.  Test performance has been validated by [facility] for patients greater than or equal to 61 year old. It is not intended to diagnose infection nor to guide or monitor treatment.          Radiology Studies: No results found.      Scheduled Meds: . acyclovir  800 mg Oral Daily  . aspirin EC  81 mg Oral Daily  . carvedilol  3.125 mg Oral BID  . enoxaparin (LOVENOX) injection  40 mg Subcutaneous Q24H  . furosemide  20 mg Intravenous BID  . hydrALAZINE  50 mg Oral TID  . insulin aspart  0-5 Units Subcutaneous QHS  . insulin aspart  0-9 Units Subcutaneous TID WC  . lidocaine  10 mL Other Once  . lisinopril  10 mg Oral Daily  . neomycin-bacitracin-polymyxin   Topical BID  . nicotine  14 mg Transdermal Daily  . pravastatin  40 mg Oral q1800  . pyridostigmine  60 mg Oral Q8H  . sodium chloride flush  3 mL Intravenous Q12H   Continuous Infusions: . sodium chloride    . cefTRIAXone (ROCEPHIN)  IV Stopped (04/12/17 0902)     LOS: 5 days    Time spent: [place], MD [facility] Pager [phone]  If 7PM-7AM, please contact  night-coverage [email or web address] Password [facility] 04/12/2017, 4:27 PM

## 2017-04-13 LAB — GLUCOSE, CAPILLARY
GLUCOSE-CAPILLARY: 205 mg/dL — AB (ref 65–99)
GLUCOSE-CAPILLARY: 147 mg/dL — AB (ref 65–99)
Glucose-Capillary: 191 mg/dL — ABNORMAL HIGH (ref 65–99)
Glucose-Capillary: 187 mg/dL — ABNORMAL HIGH (ref 65–99)

## 2017-04-13 LAB — BASIC METABOLIC PANEL
Anion gap: 10 (ref 5–15)
BUN: 30 mg/dL — AB (ref 6–20)
CALCIUM: 8.6 mg/dL — AB (ref 8.9–10.3)
CO2: 25 mmol/L (ref 22–32)
CREATININE: 1.55 mg/dL — AB (ref 0.44–1.00)
Chloride: 104 mmol/L (ref 101–111)
GFR calc non Af Amer: 38 mL/min — ABNORMAL LOW (ref >60)
GFR, EST AFRICAN AMERICAN: 44 mL/min — AB (ref >60)
Glucose, Bld: 182 mg/dL — ABNORMAL HIGH (ref 65–99)
Potassium: 3.9 mmol/L (ref 3.5–5.1)
SODIUM: 139 mmol/L (ref 135–145)

## 2017-04-13 LAB — PROTEIN / CREATININE RATIO, URINE
Creatinine, Urine: 27.96 mg/dL
PROTEIN CREATININE RATIO: 0.64 mg/mg{creat} — AB (ref 0.00–0.15)
Total Protein, Urine: 18 mg/dL

## 2017-04-13 MED ORDER — POTASSIUM CHLORIDE CRYS ER 20 MEQ PO TBCR
20.0000 meq | EXTENDED_RELEASE_TABLET | Freq: Two times a day (BID) | ORAL | Status: DC
  Administered 2017-04-13: 20 meq via ORAL
  Filled 2017-04-13: qty 1

## 2017-04-13 MED ORDER — POTASSIUM CHLORIDE IN NACL 20-0.45 MEQ/L-% IV SOLN
INTRAVENOUS | Status: AC
  Filled 2017-04-13: qty 1000

## 2017-04-13 MED ORDER — FERROUS SULFATE 325 (65 FE) MG PO TABS
325.0000 mg | ORAL_TABLET | Freq: Every day | ORAL | Status: DC
  Administered 2017-04-14 – 2017-04-15 (×2): 325 mg via ORAL
  Filled 2017-04-13 (×2): qty 1

## 2017-04-13 NOTE — Progress Notes (Signed)
Wound care done as ordered for today.  Done later in the day since it was changed late shift this am per the nurse.  The patient refused bilateral ted stockings.

## 2017-04-13 NOTE — Progress Notes (Signed)
Dressing on left foot changed as ordered. No TED hose placed due to PT refusal. Assisted pt to the chair. All belongings within reach.

## 2017-04-13 NOTE — Consult Note (Signed)
Reason for Consult:AKI Referring Physician: Dr. Andria Rhein is an 51 y.o. female.  HPI: She is a patient who has history of hypertension, diabetes, came with complaints of left foot pain. According to the patient stepped on a glass and develop some swelling and pain. Patient  came to the emergency room and  was found to have a foreign body. Presently the foreign  body was removed and feeling better. Presently  consult is called because of acute kidney injury. Patient denies any nausea or vomiting. She denies any previous history of renal failure or kidney stone. She however states that she has some swelling of her legs for some time and she was admitted to the hospital because of CHF recently. She denies any proteinuria.   Past Medical History:  Diagnosis Date  . Acute diastolic CHF (congestive heart failure) (HCC) 10/28/2016  . Carotid stenosis, asymptomatic, bilateral 01/15/2017  . Diabetes mellitus without complication (HCC)   . Heart murmur   . Hypertension   . PAD (peripheral artery disease) (HCC) 04/10/2017   Left ABI 0.74. Right ABI 0.96.    Past Surgical History:  Procedure Laterality Date  . CATARACT EXTRACTION W/PHACO Left 02/08/2016   Procedure: CATARACT EXTRACTION PHACO AND INTRAOCULAR LENS PLACEMENT (IOC);  Surgeon: Gemma Payor, MD;  Location: [facility];  Service: Ophthalmology;  Laterality: Left;  CDE 11.43   . CATARACT EXTRACTION W/PHACO Right 02/26/2016   Procedure: CATARACT EXTRACTION PHACO AND INTRAOCULAR LENS PLACEMENT RIGHT; CDE:  16.90;  Surgeon: Gemma Payor, MD;  Location: [facility];  Service: Ophthalmology;  Laterality: Right;  . CHOLECYSTECTOMY    . FOREIGN BODY REMOVAL Left 04/09/2017   Procedure: FOREIGN BODY REMOVAL ADULT FOOT;  Surgeon: Franky Macho, MD;  Location: [facility];  Service: General;  Laterality: Left;    Family History  Problem Relation Age of Onset  . Lung cancer Mother   . Lung cancer Father   . AAA (abdominal aortic aneurysm) Brother      Social History:  reports that she has been smoking Cigarettes.  She started smoking about 32 years ago. She has a 18.75 pack-year smoking history. She uses smokeless tobacco. She reports that she does not drink alcohol or use drugs.  Allergies: No Known Allergies  Medications: I have reviewed the patient's current medications.  Results for orders placed or performed during the hospital encounter of 04/07/17 (from the past 48 hour(s))  Glucose, capillary     Status: Abnormal   Collection Time: 04/11/17 11:34 AM  Result Value Ref Range   Glucose-Capillary 181 (H) 65 - 99 mg/dL   Comment 1 Notify RN   Glucose, capillary     Status: Abnormal   Collection Time: 04/11/17  4:17 PM  Result Value Ref Range   Glucose-Capillary 242 (H) 65 - 99 mg/dL   Comment 1 Notify RN   Glucose, capillary     Status: Abnormal   Collection Time: 04/11/17  9:42 PM  Result Value Ref Range   Glucose-Capillary 214 (H) 65 - 99 mg/dL  CBC     Status: Abnormal   Collection Time: 04/12/17  4:42 AM  Result Value Ref Range   WBC 8.4 4.0 - 10.5 K/uL   RBC 3.16 (L) 3.87 - 5.11 MIL/uL   Hemoglobin 9.4 (L) 12.0 - 15.0 g/dL   HCT 00.4 (L) 22.4 - 08.2 %   MCV 88.3 78.0 - 100.0 fL   MCH 29.7 26.0 - 34.0 pg   MCHC 33.7 30.0 - 36.0 g/dL  RDW 14.1 11.5 - 15.5 %   Platelets 304 150 - 400 K/uL  Basic metabolic panel     Status: Abnormal   Collection Time: 04/12/17  4:42 AM  Result Value Ref Range   Sodium 138 135 - 145 mmol/L   Potassium 3.5 3.5 - 5.1 mmol/L   Chloride 103 101 - 111 mmol/L   CO2 26 22 - 32 mmol/L   Glucose, Bld 174 (H) 65 - 99 mg/dL   BUN 32 (H) 6 - 20 mg/dL   Creatinine, Ser 1.78 (H) 0.44 - 1.00 mg/dL   Calcium 8.4 (L) 8.9 - 10.3 mg/dL   GFR calc non Af Amer 32 (L) >60 mL/min   GFR calc Af Amer 38 (L) >60 mL/min    Comment: (NOTE) The eGFR has been calculated using the CKD EPI equation. This calculation has not been validated in all clinical situations. eGFR's persistently <60 mL/min  signify possible Chronic Kidney Disease.    Anion gap 9 5 - 15  Glucose, capillary     Status: Abnormal   Collection Time: 04/12/17  7:43 AM  Result Value Ref Range   Glucose-Capillary 148 (H) 65 - 99 mg/dL   Comment 1 Notify RN   Chloride, urine, random     Status: None   Collection Time: 04/12/17  9:00 AM  Result Value Ref Range   Chloride Urine <15 mmol/L  Creatinine, urine, random     Status: None   Collection Time: 04/12/17  9:00 AM  Result Value Ref Range   Creatinine, Urine 150.13 mg/dL  Na and K (sodium & potassium), rand urine     Status: None   Collection Time: 04/12/17  9:00 AM  Result Value Ref Range   Sodium, Ur 25 mmol/L   Potassium Urine 25 mmol/L  Glucose, capillary     Status: Abnormal   Collection Time: 04/12/17 11:27 AM  Result Value Ref Range   Glucose-Capillary 260 (H) 65 - 99 mg/dL   Comment 1 Notify RN   Glucose, capillary     Status: Abnormal   Collection Time: 04/12/17  4:26 PM  Result Value Ref Range   Glucose-Capillary 178 (H) 65 - 99 mg/dL   Comment 1 Notify RN   Glucose, capillary     Status: Abnormal   Collection Time: 04/12/17  8:52 PM  Result Value Ref Range   Glucose-Capillary 216 (H) 65 - 99 mg/dL  Basic metabolic panel     Status: Abnormal   Collection Time: 04/13/17  7:04 AM  Result Value Ref Range   Sodium 139 135 - 145 mmol/L   Potassium 3.9 3.5 - 5.1 mmol/L   Chloride 104 101 - 111 mmol/L   CO2 25 22 - 32 mmol/L   Glucose, Bld 182 (H) 65 - 99 mg/dL   BUN 30 (H) 6 - 20 mg/dL   Creatinine, Ser 1.55 (H) 0.44 - 1.00 mg/dL   Calcium 8.6 (L) 8.9 - 10.3 mg/dL   GFR calc non Af Amer 38 (L) >60 mL/min   GFR calc Af Amer 44 (L) >60 mL/min    Comment: (NOTE) The eGFR has been calculated using the CKD EPI equation. This calculation has not been validated in all clinical situations. eGFR's persistently <60 mL/min signify possible Chronic Kidney Disease.    Anion gap 10 5 - 15  Glucose, capillary     Status: Abnormal   Collection Time:  04/13/17  9:10 AM  Result Value Ref Range   Glucose-Capillary 191 (H) 65 -  99 mg/dL   Comment 1 Document in Chart     No results found.  Review of Systems  Constitutional: Negative for chills and fever.  Respiratory: Negative for cough, sputum production and shortness of breath.   Cardiovascular: Positive for leg swelling. Negative for orthopnea.  Gastrointestinal: Negative for nausea and vomiting.  Musculoskeletal:       Left foot pain and swelling   Blood pressure (!) 147/35, pulse 67, temperature 98.5 F (36.9 C), temperature source Oral, resp. rate 16, height '5\' 4"'$  (1.626 m), weight 96.4 kg (212 lb 8 oz), last menstrual period 12/16/2015, SpO2 94 %. Physical Exam  Assessment/Plan: problem #1 acute kidney injury: Possibly a combination of vancomycin related AKI/ Acyclovir/ Pre/renal/ATN. Patient presently nonoliguric. Her renal function is improving. Patient denies any nausea or vomiting. Problem #2 hypertension: Blood pressure is reasonably controlled Problem #3 history of diabetes Problem #4 history of diabetic neuropathy Problem #5 history of infected left leg/foreign body and status post removal Problem #6 anemia :  Problem#7 PAD Plan: Agree with present treatment 2]Renal panel in am 3]Urine protein to creatinine ratio  Ethelbert Thain S 04/13/2017, 10:17 AM

## 2017-04-13 NOTE — Progress Notes (Signed)
PROGRESS NOTE    Natalie Contreras  NFA:[id number] DOB: [birth year] DOA: 04/07/2017 PCP: [name], MD    Brief Narrative:  [name] with DM-2 who presented with a left foot injury after apparently accidentally stepping on a piece of glass at work. She did not notice or feel the injury until the following few days when she saw blood in her shoe. 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 Her foot became red, swollen, and discolored. In the ED, she was afebrile and hemodynamically stable. Her lab data revealed:  sodium 140, potassium 3.4, chloride 101, bicarbonate 28, glucose 118, BUN27, creatinine 0.82, white count 11.1, hemoglobin 10.9, hematocrit 33.5, platelets 316, urinalysis negative for infection. Foot x-ray revealed stable appearing retained foreign object along the plantar aspect. Chest x-ray was negative for infiltrates. She was admitted for evaluation and management of left foot cellulitis/abscess.  Assessment & Plan:   Principal Problem:   Cellulitis and abscess of foot Active Problems:   PAD (peripheral artery disease) (HCC)   Diabetes mellitus with neuropathy (HCC)   Chronic diastolic CHF (congestive heart failure) (HCC)   AKI (acute kidney injury) ([place])   Foreign body in left foot   Essential hypertension   1. Left foot cellulitis with abscess associated with left foot foreign body. The patient was given Rocephin 1 in the ED and subsequently started on vancomycin. Supportive treatment and gentle IV fluids were given. -Zosyn was added to vancomycin for treatment. -General surgeon, Dr. Arnoldo Morale was consulted for removal of the foreign object from her left foot. She was taken to the OR  on 7/25 with successful removal of the foreign object. -ABI was ordered by Dr. Arnoldo Morale and it revealed PAD of the left lower extremity with an ABI of 0.74. -Wound culture prior to the operation revealed multiple organisms with none predominate. Blood cultures have remained  negative to date. She is afebrile and her white blood cell count has normalized. --Antibiotic narrowed from Zosyn and vancomycin to Rocephin for monotherapy. Day #7 total antibiotic therapy. -The left foot cellulitis/abscess is resolving and appears to be healing well. Discussed with Dr. Arnoldo Morale. -Consider 2-3 more days of antibiotics which could be treated orally following discharge.  PAD/ischemic changes left second toe Noted per arterial Dopplers. Patient also has a history of nonobstructive coronary artery disease. -Patient has ischemic changes of the distal left second toe. This was noted by Dr. Arnoldo Morale. He recommends watchful observation, but notes that the patient may eventually lose that toe. -Aspirin was restarted. -Fasting lipid profile revealed a total cholesterol of 155, HDL of 35, and LDL of 104. Pravastatin was continued.  Acute on chronic diastolic heart failure. The patient is treated with torsemide chronically. It was held on admission. On admission, she was started on gentle IV fluids 48 hours. -She developed worsening bilateral lower extremity edema on 7/27. -Lasix 20 mg IV every 12 hours was started on 7/27. Compression stockings were added, with some improvement.  Acute kidney injury. Patient has no history of chronic kidney disease. Her creatinine was within normal limits on admission. -Creatinine trended up to 1.39 on 7/26>>1.63 >>1.78. Lasix was started as above along with IV fluids. -Lisinopril was held on 7/28. -Urine indices ordered and noted. -Nephrology consulted. Befekadu He agreed with the current management of Lasix and IV fluids. -Etiology could be a combination of vancomycin induced renal injury/Acyclovir/Prerenal. -Her creatinine improved to 1.5 today. -Continue current management.  Essential hypertension. Patient is treated chronically with carvedilol, hydralazine, and lisinopril.  All were continued on admission. -Lisinopril was discontinued 7/28 due  to acute kidney injury. -Her blood pressure has been stable. If her blood pressure trends up off of lisinopril, will increase the dose of carvedilol or hydralazine as tolerated.  Type 2 diabetes with neuropathy. Patient is treated chronically with glimeperide, glipizide, and metformin. Her oral agents were withheld and sliding scale NovoLog was started. -Her CBGs have been relatively controlled, generally less than 200. -Mealtime NovoLog was discontinued due to borderline hypoglycemia a couple of days ago. - Her A1c was 6.7.  Normocytic anemia. Patient's hemoglobin was 10.9 on admission and has drifted down to 9.5>>9.0. -Patient appears to have chronic normocytic anemia, likely of chronic disease with an element of mild acute blood loss from the operation. -Anemia studies ordered and revealed a low total iron of 21, TIBC 195, ferritin 205, and B12 of 440. TSH was within normal limits at 1.5. -Ferrous sulfate ordered. Her hemoglobin has been stable ranging from 9.0-9.4.    DVT prophylaxis: Lovenox Code Status: Full code Family Communication: Family not available Disposition Plan: Discharge to home with clinically appropriate   Consultants:   Gen Surgery  Procedures:   ABI-base left ABI 0.74 (previously 0.79). Right ABI 0.96-stable. 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  Removal of foreign body, left foot, Dr. Arnoldo Morale, 04/09/17.  Antimicrobials:   Zosyn>>7/25  Vancomycin 7/23>>7/26  Rocephin 7/23 x 1 in ED>>restarted on 7/26>>   Subjective: Patient has no new complaints of chest pain, abdominal pain, painful with urination, or increased foot pain.  Objective: Vitals:   04/12/17 2055 04/13/17 0500 04/13/17 0954 04/13/17 1427  BP: 136/81 (!) 100/37 (!) 147/35 128/72  Pulse: 82 74 67 77  Resp:  16  16  Temp: 98.3 F (36.8 C) 98.5 F (36.9 C)  99.2 F (37.3 C)  TempSrc: Oral Oral  Oral  SpO2: 96% 94%  93%  Weight:  96.4 kg (212 lb 8 oz)      Height:        Intake/Output Summary (Last 24 hours) at 04/13/17 1738 Last data filed at 04/13/17 0624  Gross per 24 hour  Intake          1659.67 ml  Output             1050 ml  Net           609.67 ml   Filed Weights   04/09/17 0751 04/12/17 0500 04/13/17 0500  Weight: 88.9 kg (196 lb) 95.9 kg (211 lb 6.4 oz) 96.4 kg (212 lb 8 oz)    Examination:  General exam: Appears calm and comfortable  Respiratory system: Clear Anteriorly and decreased breath sounds in the bases. Respiratory effort normal. Cardiovascular system: S1 & S2 heard, with a 2/6 systolic murmur.. 2-3+ bilateral lower extremity pitting edema-Slight decrease from yesterday.. Gastrointestinal system: Abdomen is obese, nondistended, soft and nontender. No organomegaly or masses felt. Normal bowel sounds heard. Central nervous system: Alert and oriented. No focal neurological deficits. Skin: Left foot with plantar incision healing; no early drainage; mild edema around the incision site;  bluish hue of the left second toe. Psychiatry: Judgement and insight appear normal. Mood & affect appropriate.     Data Reviewed: I have personally reviewed following labs and imaging studies  CBC:  Recent Labs Lab 04/07/17 1140 04/08/17 0606 04/09/17 0425 04/10/17 0420 04/11/17 0511 04/12/17 0442  WBC 11.1* 7.9 8.6 8.3 8.7 8.4  NEUTROABS 8.2*  --  5.4  --   --   --  HGB 10.9* 9.5* 9.2* 9.0* 9.4* 9.4*  HCT 33.5* 29.3* 27.9* 28.4* 28.8* 27.9*  MCV 89.6 89.1 88.9 91.3 88.9 88.3  PLT 316 269 263 260 336 563   Basic Metabolic Panel:  Recent Labs Lab 04/09/17 0425 04/10/17 0420 04/11/17 0511 04/12/17 0442 04/13/17 0704  NA 138 139 137 138 139  K 3.3* 3.7 3.7 3.5 3.9  CL 102 103 103 103 104  CO2 27 28 26 26 25   GLUCOSE 105* 125* 111* 174* 182*  BUN 26* 28* 29* 32* 30*  CREATININE 0.90 1.39* 1.63* 1.78* 1.55*  CALCIUM 8.3* 8.6* 8.8* 8.4* 8.6*   GFR: Estimated Creatinine Clearance: 49.5 mL/min (A) (by C-G formula  based on SCr of 1.55 mg/dL (H)). Liver Function Tests:  Recent Labs Lab 04/08/17 0606  AST 30  ALT 30  ALKPHOS 140*  BILITOT 0.5  PROT 5.7*  ALBUMIN 2.4*   No results for input(s): LIPASE, AMYLASE in the last 168 hours. No results for input(s): AMMONIA in the last 168 hours. Coagulation Profile: No results for input(s): INR, PROTIME in the last 168 hours. Cardiac Enzymes: No results for input(s): CKTOTAL, CKMB, CKMBINDEX, TROPONINI in the last 168 hours. BNP (last 3 results) No results for input(s): PROBNP in the last 8760 hours. HbA1C:  Recent Labs  04/11/17 0511  HGBA1C 6.7*   CBG:  Recent Labs Lab 04/12/17 1626 04/12/17 2052 04/13/17 0910 04/13/17 1254 04/13/17 1710  GLUCAP 178* 216* 191* 205* 187*   Lipid Profile:  Recent Labs  04/11/17 0511  CHOL 155  HDL 35*  LDLCALC 104*  TRIG 79  CHOLHDL 4.4   Thyroid Function Tests:  Recent Labs  04/11/17 0511  TSH 1.509   Anemia Panel:  Recent Labs  04/11/17 0511  VITAMINB12 440  FERRITIN 205  TIBC 195*  IRON 21*   Sepsis Labs:  Recent Labs Lab 04/07/17 1140 04/07/17 1326  LATICACIDVEN 1.0 0.7    Recent Results (from the past 240 hour(s))  Culture, blood (routine x 2)     Status: None   Collection Time: 04/07/17 11:40 AM  Result Value Ref Range Status   Specimen Description BLOOD BLOOD LEFT WRIST  Final   Special Requests   Final    BOTTLES DRAWN AEROBIC AND ANAEROBIC Blood Culture adequate volume   Culture NO GROWTH 5 DAYS  Final   Report Status 04/12/2017 FINAL  Final  Culture, blood (routine x 2)     Status: None   Collection Time: 04/07/17 11:40 AM  Result Value Ref Range Status   Specimen Description BLOOD BLOOD LEFT HAND  Final   Special Requests   Final    BOTTLES DRAWN AEROBIC AND ANAEROBIC Blood Culture adequate volume   Culture NO GROWTH 5 DAYS  Final   Report Status 04/12/2017 FINAL  Final  Wound or Superficial Culture     Status: Abnormal   Collection Time: 04/07/17   1:35 PM  Result Value Ref Range Status   Specimen Description FOOT  Final   Special Requests Normal  Final   Gram Stain   Final    FEW WBC PRESENT, PREDOMINANTLY PMN MODERATE GRAM POSITIVE COCCI FEW GRAM POSITIVE RODS RARE GRAM NEGATIVE RODS Performed at [facility], [place] [address] [place], [place] [address]    Culture MULTIPLE ORGANISMS PRESENT, NONE PREDOMINANT (A)  Final   Report Status 04/10/2017 FINAL  Final  Surgical PCR screen     Status: None   Collection Time: 04/08/17  9:43 AM  Result Value Ref Range Status   MRSA, PCR NEGATIVE NEGATIVE Final   Staphylococcus aureus NEGATIVE NEGATIVE Final    Comment:        The Xpert SA Assay (FDA approved for NASAL specimens in patients over 49 years of age), is one component of a comprehensive surveillance program.  Test performance has been validated by [facility] for patients greater than or equal to 65 year old. It is not intended to diagnose infection nor to guide or monitor treatment.          Radiology Studies: No results found.      Scheduled Meds: . acyclovir  800 mg Oral Daily  . aspirin EC  81 mg Oral Daily  . carvedilol  3.125 mg Oral BID  . enoxaparin (LOVENOX) injection  40 mg Subcutaneous Q24H  . furosemide  30 mg Intravenous BID  . hydrALAZINE  50 mg Oral TID  . insulin aspart  0-5 Units Subcutaneous QHS  . insulin aspart  0-9 Units Subcutaneous TID WC  . lidocaine  10 mL Other Once  . neomycin-bacitracin-polymyxin   Topical BID  . nicotine  14 mg Transdermal Daily  . pravastatin  40 mg Oral q1800  . pyridostigmine  60 mg Oral Q8H  . sodium chloride flush  3 mL Intravenous Q12H   Continuous Infusions: . 0.45 % NaCl with KCl 20 mEq / L 100 mL/hr at 04/13/17 0724  . sodium chloride    . cefTRIAXone (ROCEPHIN)  IV Stopped (04/13/17 1016)     LOS: 6 days    Time spent: [place], MD [facility] Pager [phone]  If 7PM-7AM, please contact  night-coverage [email or web address] Password [facility] 04/13/2017, 5:38 PM

## 2017-04-14 LAB — RENAL FUNCTION PANEL
ALBUMIN: 2.3 g/dL — AB (ref 3.5–5.0)
ANION GAP: 10 (ref 5–15)
BUN: 32 mg/dL — ABNORMAL HIGH (ref 6–20)
CALCIUM: 8.8 mg/dL — AB (ref 8.9–10.3)
CO2: 25 mmol/L (ref 22–32)
CREATININE: 1.44 mg/dL — AB (ref 0.44–1.00)
Chloride: 104 mmol/L (ref 101–111)
GFR, EST AFRICAN AMERICAN: 49 mL/min — AB (ref >60)
GFR, EST NON AFRICAN AMERICAN: 42 mL/min — AB (ref >60)
Glucose, Bld: 134 mg/dL — ABNORMAL HIGH (ref 65–99)
PHOSPHORUS: 3.5 mg/dL (ref 2.5–4.6)
Potassium: 4.1 mmol/L (ref 3.5–5.1)
SODIUM: 139 mmol/L (ref 135–145)

## 2017-04-14 LAB — GLUCOSE, CAPILLARY
GLUCOSE-CAPILLARY: 250 mg/dL — AB (ref 65–99)
GLUCOSE-CAPILLARY: 186 mg/dL — AB (ref 65–99)
Glucose-Capillary: 140 mg/dL — ABNORMAL HIGH (ref 65–99)
Glucose-Capillary: 220 mg/dL — ABNORMAL HIGH (ref 65–99)

## 2017-04-14 NOTE — Progress Notes (Signed)
Wound care completed as ordered. Pt tolerated well. No pain per patient. Wound is healing well.  Margaret Pyle, RN

## 2017-04-14 NOTE — Plan of Care (Signed)
Problem: Tissue Perfusion: Goal: Risk factors for ineffective tissue perfusion will decrease Outcome: Progressing Pt has refused SCD's and TED hose. RN re explained the importance of these items. Patient is receiving lovenox shots and moving extremities adequately.

## 2017-04-14 NOTE — Progress Notes (Signed)
[facility] PROGRESS NOTE    Progress Note  Natalie Contreras  QBH:[id number] DOB: [birth year] DOA: 04/07/2017 PCP: [name], MD     Brief Narrative:   Natalie Contreras is an 51 y.o. female past medical history diabetes mellitus type 2, with left foot injury after apparently accidentally stepping on a piece of glass.  Assessment/Plan:   Left Cellulitis and abscess of foot Patient on IV Rocephin. Surgical intervention was performed on 04/09/2017. ABI orders by Dr. Arnoldo Morale which revealed. The the left lower extremity. Blood cultures remain negative till date.  PAD: Continue aspirin. ABIs showed decreased flow of 0.74 to the left lower extremity. Continue pravastatin. Continue aspirin.  Essential hypertension: Continue Coreg hydralazine and lisinopril.  Diabetes mellitus with neuropathy (HCC) Continue oral agents and sliding scale insulin.  AKI: Nephrology was consulted who agreed with IV Lasix and IV fluids. This likely due to combination of IV vancomycin, acyclovir in the setting of prerenal azotemia. Continue to hold torsemide.  Acute on chronic diastolic heart failure: She is diuresing well on IV Lasix, continue compression stockings. Further management per IV Lasix.  Essential hypertension: Continue Coreg Lasix and hydralazine. Blood pressure seems to be stable.  Normocytic anemia: Hemoglobin slowly trending down, started empirically on oral ferrous sulfate.   DVT prophylaxis: lovenox Family Communication:none Disposition Plan/Barrier to D/C: home hopefully in am Code Status:     Code Status Orders        Start     Ordered   04/07/17 1701  Full code  Continuous     04/07/17 1700    Code Status History    Date Active Date Inactive Code Status Order ID Comments User Context   10/28/2016  1:07 PM 11/01/2016  3:52 PM Full Code [id number]  Kathie Dike, MD Inpatient        IV Access:    Peripheral IV   Procedures and diagnostic studies:     No results found.   Medical Consultants:    None.  Anti-Infectives:   [place]  Subjective:    Natalie Contreras 1 no new complains.  Objective:    Vitals:   04/13/17 1427 04/13/17 2048 04/14/17 0500 04/14/17 0556  BP: 128/72 130/68  (!) 138/33  Pulse: 77 78  100  Resp: 16 16  16   Temp: 99.2 F (37.3 C) 99.1 F (37.3 C)  98.2 F (36.8 C)  TempSrc: Oral Oral  Oral  SpO2: 93% 94%  98%  Weight:   95.4 kg (210 lb 6.4 oz)   Height:        Intake/Output Summary (Last 24 hours) at 04/14/17 0942 Last data filed at 04/14/17 0100  Gross per 24 hour  Intake             1820 ml  Output                0 ml  Net             1820 ml   Filed Weights   04/12/17 0500 04/13/17 0500 04/14/17 0500  Weight: 95.9 kg (211 lb 6.4 oz) 96.4 kg (212 lb 8 oz) 95.4 kg (210 lb 6.4 oz)    Exam: General exam: In no acute distress. Respiratory system: Good air movement clear to auscultation. Cardiovascular system: Regular rate and rhythm with positive S1-S2 no murmurs rubs gallops. Gastrointestinal system: Bowel sounds soft nontender nondistended. Central nervous system: Awake alert and oriented 3. Extremities: Lower extremity edema. Skin: Left foot plantar incision is  healing mild erythema. Psychiatry: Judgment and insight are normal.   Data Reviewed:    Labs: Basic Metabolic Panel:  Recent Labs Lab 04/10/17 0420 04/11/17 0511 04/12/17 0442 04/13/17 0704 04/14/17 0650  NA 139 137 138 139 139  K 3.7 3.7 3.5 3.9 4.1  CL 103 103 103 104 104  CO2 28 26 26 25 25   GLUCOSE 125* 111* 174* 182* 134*  BUN 28* 29* 32* 30* 32*  CREATININE 1.39* 1.63* 1.78* 1.55* 1.44*  CALCIUM 8.6* 8.8* 8.4* 8.6* 8.8*  PHOS  --   --   --   --  3.5   GFR Estimated Creatinine Clearance: 53 mL/min (A) (by C-G formula based on SCr of 1.44 mg/dL (H)). Liver Function Tests:  Recent Labs Lab 04/08/17 0606 04/14/17 0650  AST 30  --   ALT 30  --   ALKPHOS 140*  --   BILITOT 0.5  --   PROT  5.7*  --   ALBUMIN 2.4* 2.3*   No results for input(s): LIPASE, AMYLASE in the last 168 hours. No results for input(s): AMMONIA in the last 168 hours. Coagulation profile No results for input(s): INR, PROTIME in the last 168 hours.  CBC:  Recent Labs Lab 04/07/17 1140 04/08/17 0606 04/09/17 0425 04/10/17 0420 04/11/17 0511 04/12/17 0442  WBC 11.1* 7.9 8.6 8.3 8.7 8.4  NEUTROABS 8.2*  --  5.4  --   --   --   HGB 10.9* 9.5* 9.2* 9.0* 9.4* 9.4*  HCT 33.5* 29.3* 27.9* 28.4* 28.8* 27.9*  MCV 89.6 89.1 88.9 91.3 88.9 88.3  PLT 316 269 263 260 336 304   Cardiac Enzymes: No results for input(s): CKTOTAL, CKMB, CKMBINDEX, TROPONINI in the last 168 hours. BNP (last 3 results) No results for input(s): PROBNP in the last 8760 hours. CBG:  Recent Labs Lab 04/13/17 0910 04/13/17 1254 04/13/17 1710 04/13/17 2046 04/14/17 0729  GLUCAP 191* 205* 187* 147* 140*   D-Dimer: No results for input(s): DDIMER in the last 72 hours. Hgb A1c: No results for input(s): HGBA1C in the last 72 hours. Lipid Profile: No results for input(s): CHOL, HDL, LDLCALC, TRIG, CHOLHDL, LDLDIRECT in the last 72 hours. Thyroid function studies: No results for input(s): TSH, T4TOTAL, T3FREE, THYROIDAB in the last 72 hours.  Invalid input(s): FREET3 Anemia work up: No results for input(s): VITAMINB12, FOLATE, FERRITIN, TIBC, IRON, RETICCTPCT in the last 72 hours. Sepsis Labs:  Recent Labs Lab 04/07/17 1140 04/07/17 1326  04/09/17 0425 04/10/17 0420 04/11/17 0511 04/12/17 0442  WBC 11.1*  --   < > 8.6 8.3 8.7 8.4  LATICACIDVEN 1.0 0.7  --   --   --   --   --   < > = values in this interval not displayed. Microbiology Recent Results (from the past 240 hour(s))  Culture, blood (routine x 2)     Status: None   Collection Time: 04/07/17 11:40 AM  Result Value Ref Range Status   Specimen Description BLOOD BLOOD LEFT WRIST  Final   Special Requests   Final    BOTTLES DRAWN AEROBIC AND ANAEROBIC  Blood Culture adequate volume   Culture NO GROWTH 5 DAYS  Final   Report Status 04/12/2017 FINAL  Final  Culture, blood (routine x 2)     Status: None   Collection Time: 04/07/17 11:40 AM  Result Value Ref Range Status   Specimen Description BLOOD BLOOD LEFT HAND  Final   Special Requests   Final    BOTTLES  DRAWN AEROBIC AND ANAEROBIC Blood Culture adequate volume   Culture NO GROWTH 5 DAYS  Final   Report Status 04/12/2017 FINAL  Final  Wound or Superficial Culture     Status: Abnormal   Collection Time: 04/07/17  1:35 PM  Result Value Ref Range Status   Specimen Description FOOT  Final   Special Requests Normal  Final   Gram Stain   Final    FEW WBC PRESENT, PREDOMINANTLY PMN MODERATE GRAM POSITIVE COCCI FEW GRAM POSITIVE RODS RARE GRAM NEGATIVE RODS Performed at [facility], [place] [address] [place],  [address]    Culture MULTIPLE ORGANISMS PRESENT, NONE PREDOMINANT (A)  Final   Report Status 04/10/2017 FINAL  Final  Surgical PCR screen     Status: None   Collection Time: 04/08/17  9:43 AM  Result Value Ref Range Status   MRSA, PCR NEGATIVE NEGATIVE Final   Staphylococcus aureus NEGATIVE NEGATIVE Final    Comment:        The Xpert SA Assay (FDA approved for NASAL specimens in patients over 28 years of age), is one component of a comprehensive surveillance program.  Test performance has been validated by [facility] for patients greater than or equal to 68 year old. It is not intended to diagnose infection nor to guide or monitor treatment.      Medications:   . acyclovir  800 mg Oral Daily  . aspirin EC  81 mg Oral Daily  . carvedilol  3.125 mg Oral BID  . enoxaparin (LOVENOX) injection  40 mg Subcutaneous Q24H  . ferrous sulfate  325 mg Oral Q breakfast  . furosemide  30 mg Intravenous BID  . hydrALAZINE  50 mg Oral TID  . insulin aspart  0-5 Units Subcutaneous QHS  . insulin aspart  0-9 Units Subcutaneous TID WC  . lidocaine  10 mL Other Once   . neomycin-bacitracin-polymyxin   Topical BID  . nicotine  14 mg Transdermal Daily  . pravastatin  40 mg Oral q1800  . pyridostigmine  60 mg Oral Q8H  . sodium chloride flush  3 mL Intravenous Q12H   Continuous Infusions: . 0.45 % NaCl with KCl 20 mEq / L 100 mL/hr at 04/14/17 0557  . sodium chloride    . cefTRIAXone (ROCEPHIN)  IV Stopped (04/14/17 6962)      LOS: 7 days   Charlynne Cousins  [facility] Pager [phone]  *Please refer to [email or web address], password [email or web address] to get updated schedule on who will round on this patient, as hospitalists switch teams weekly. If 7PM-7AM, please contact night-coverage at [email or web address], password [email or web address] for any overnight needs.  04/14/2017, 9:42 AM

## 2017-04-14 NOTE — Progress Notes (Signed)
Subjective: Interval History: has no complaint of nausea or vomiting. She is  feeling much better  Objective: Vital signs in last 24 hours: Temp:  [98.2 F (36.8 C)-99.2 F (37.3 C)] 98.2 F (36.8 C) (07/30 0556) Pulse Rate:  [67-100] 100 (07/30 0556) Resp:  [16] 16 (07/30 0556) BP: (128-147)/(33-72) 138/33 (07/30 0556) SpO2:  [93 %-98 %] 98 % (07/30 0556) Weight:  [95.4 kg (210 lb 6.4 oz)] 95.4 kg (210 lb 6.4 oz) (07/30 0500) Weight change: -0.953 kg (-2 lb 1.6 oz)  Intake/Output from previous day: 07/29 0701 - 07/30 0700 In: 1920 [I.V.:1870; IV Piggyback:50] Out: -  Intake/Output this shift: No intake/output data recorded.  General appearance: alert, cooperative and no distress Resp: clear to auscultation bilaterally Cardio: regular rate and rhythm, S1, S2 normal, no murmur, click, rub or gallop Extremities: no edema, redness or tenderness in the calves or thighs  Lab Results:  Recent Labs  04/12/17 0442  WBC 8.4  HGB 9.4*  HCT 27.9*  PLT 304   BMET:  Recent Labs  04/13/17 0704 04/14/17 0650  NA 139 139  K 3.9 4.1  CL 104 104  CO2 25 25  GLUCOSE 182* 134*  BUN 30* 32*  CREATININE 1.55* 1.44*  CALCIUM 8.6* 8.8*   No results for input(s): PTH in the last 72 hours. Iron Studies: No results for input(s): IRON, TIBC, TRANSFERRIN, FERRITIN in the last 72 hours.  Studies/Results: No results found.  I have reviewed the patient's current medications.  Assessment/Plan: Problem #1 acute kidney injury: Her renal function is improving. Presently she is asymptomatic. Patient is non-oliguric and had 1900 mL of urine output the last 24 hours. Problem #2 hypertension: Her blood pressure is reasonably controlled Problem #3 history of diabetes Problem #4 anemia: Her iron saturation is 11% and ferritin is 25 most likely iron deficiency anemia Problem #5 cellulitis of her left foot: Presently she is on antibiotics. Patient is a febrile and her white blood cell count is  normal. Problem #6 bone and mineral disorder: Her calcium and phosphorous is his range. Problem #7 PAD Plan: Decrease IV fluid to 75 mL per hour We'll check renal panel in the morning.  LOS: 7 days   Eeshan Verbrugge S 04/14/2017,8:13 AM

## 2017-04-15 DIAGNOSIS — L03119 Cellulitis of unspecified part of limb: Secondary | ICD-10-CM

## 2017-04-15 DIAGNOSIS — L02619 Cutaneous abscess of unspecified foot: Secondary | ICD-10-CM

## 2017-04-15 DIAGNOSIS — L03116 Cellulitis of left lower limb: Secondary | ICD-10-CM

## 2017-04-15 DIAGNOSIS — I5032 Chronic diastolic (congestive) heart failure: Secondary | ICD-10-CM

## 2017-04-15 DIAGNOSIS — N179 Acute kidney failure, unspecified: Secondary | ICD-10-CM

## 2017-04-15 LAB — CBC
HCT: 26.8 % — ABNORMAL LOW (ref 36.0–46.0)
Hemoglobin: 8.8 g/dL — ABNORMAL LOW (ref 12.0–15.0)
MCH: 28.9 pg (ref 26.0–34.0)
MCHC: 32.8 g/dL (ref 30.0–36.0)
MCV: 88.2 fL (ref 78.0–100.0)
PLATELETS: 316 10*3/uL (ref 150–400)
RBC: 3.04 MIL/uL — AB (ref 3.87–5.11)
RDW: 14.2 % (ref 11.5–15.5)
WBC: 8.7 10*3/uL (ref 4.0–10.5)

## 2017-04-15 LAB — RENAL FUNCTION PANEL
Albumin: 2.2 g/dL — ABNORMAL LOW (ref 3.5–5.0)
Anion gap: 10 (ref 5–15)
BUN: 35 mg/dL — AB (ref 6–20)
CO2: 25 mmol/L (ref 22–32)
Calcium: 8.7 mg/dL — ABNORMAL LOW (ref 8.9–10.3)
Chloride: 106 mmol/L (ref 101–111)
Creatinine, Ser: 1.41 mg/dL — ABNORMAL HIGH (ref 0.44–1.00)
GFR calc Af Amer: 50 mL/min — ABNORMAL LOW (ref >60)
GFR calc non Af Amer: 43 mL/min — ABNORMAL LOW (ref >60)
GLUCOSE: 197 mg/dL — AB (ref 65–99)
PHOSPHORUS: 4.3 mg/dL (ref 2.5–4.6)
POTASSIUM: 4.1 mmol/L (ref 3.5–5.1)
SODIUM: 141 mmol/L (ref 135–145)

## 2017-04-15 LAB — GLUCOSE, CAPILLARY
Glucose-Capillary: 168 mg/dL — ABNORMAL HIGH (ref 65–99)
Glucose-Capillary: 262 mg/dL — ABNORMAL HIGH (ref 65–99)
Glucose-Capillary: 160 mg/dL — ABNORMAL HIGH (ref 65–99)

## 2017-04-15 MED ORDER — AMOXICILLIN-POT CLAVULANATE 875-125 MG PO TABS: 1.0000 | ORAL_TABLET | Freq: Two times a day (BID) | ORAL | Status: DC

## 2017-04-15 MED ORDER — AMOXICILLIN-POT CLAVULANATE 875-125 MG PO TABS: 1.0000 | ORAL_TABLET | Freq: Two times a day (BID) | ORAL | 0 refills | Status: DC

## 2017-04-15 NOTE — Care Management Note (Signed)
Case Management Note  Patient Details  Name: ANAIAH MCMANNIS MRN: [id number] Date of Birth: [birth year]  Expected Discharge Date:     04/15/2017             Expected Discharge Plan:  Home/Self Care  In-House Referral:  NA  Discharge planning Services  CM Consult  Post Acute Care Choice:  Durable Medical Equipment Choice offered to:  Patient  DME Arranged:  Shower stool DME Agency:  [place].  Status of Service:  Completed, signed off  If discussed at [place] of Stay Meetings, dates discussed:  04/15/2017   Sherald Barge, RN 04/15/2017, 3:14 PM

## 2017-04-15 NOTE — Discharge Summary (Signed)
Physician Discharge Summary  Natalie Contreras CHE:[id number] DOB: 03/09/1966 DOA: 04/07/2017  PCP: [name], MD  Admit date: 04/07/2017 Discharge date: 04/15/2017  Admitted From: Home.  Disposition:  To home.   Recommendations for Outpatient Follow-up:  1. Follow up with PCP in 1-2 days. 2. Follow up with surgery in 2 weeks. 3. Follow up with nephrology in 2 weeks.   Home Health: None.  Equipment/Devices: None.  Discharge Condition: residual redness of lower extrmities. Cr of 1.4.  CODE STATUS: FULL CODE.  Diet recommendation: cardiac diet.   Brief/Interim Summary: Patient was admitted into the hospital for left foot cellulitis and AKI on April 07, 2017 by Dr Cathlean Sauer.  As per his H and P:  " Natalie Contreras is a 51 y.o. female with medical history significant of type 2 diabetes mellitus. Patient presents with left foot pain. The pain is moderate to severe intensity, with local edema and erythema, there is no improving factors, it seems to be worse with ambulation. Denies any associated fevers chills, nausea vomiting. The pain does not radiate. Her symptoms started about 7 days ago with a injury to his left foot, she stepped on a glass, producing a cut on her forefoot. These symptoms have been worsening over last 7 days. Today the skin had worsening discoloration, that prompted her to come to the hospital.  ED Course: Diagnosed with a cellulitis, started on IV antibiotics, she had local debridement.  HOSPITAL COURSE: Patient was admitted into the hospital, and she was seen in consultation with surgery, Dr Arnoldo Morale.  She was taken to the OR, and had and I and D with removal of foreign body (Glass she stepped on).  She also has a discoloration of her middle toe.  This seems to have improved slightly, but it is still looking ashened.  She was started on IV Rocephin, and later IV Vancomycin.  Her Cr rose, and Dr Hinda Lenis was seen in consultation.  He felt that her AKI was multifactorial including  Vancomycin induced nephropathy, along with prerenal cause, and ATN.  Her renal improved and her discharge Cr was 1.4.  Dr Arnoldo Morale would like to see her in the office in 2 weeks, and Dr Chilton Greathouse would like to see her in follow up as well.  She is very anxious to go home, and was almost insistant.  I marked her cellulitis border, so Dr Maudie Mercury can see it when she see him for follow up in a couple of days.  She will be discharged on Augmentin 875 BID for 5 days.  Thank you for allowing me to pariticipate in her care.  Good Day.     Discharge Diagnoses:  Principal Problem:   Cellulitis and abscess of foot Active Problems:   Diabetes mellitus with neuropathy ([place])   Foreign body in left foot   PAD (peripheral artery disease) ([facility])   Essential hypertension   Chronic diastolic CHF (congestive heart failure) (H[facility])   AKI (acute kidney injury) [facility])   Discharge Instructions  Discharge Instructions    Diet - low sodium heart healthy    Complete by:  As directed    Discharge instructions    Complete by:  As directed    Follow up with Dr Maudie Mercury in 2 days so he can check on your cellulitis.   Increase activity slowly    Complete by:  As directed      Allergies as of 04/15/2017   No Known Allergies     Medication List  STOP taking these medications   acyclovir 800 MG tablet Commonly known as:  ZOVIRAX   lisinopril 30 MG tablet Commonly known as:  PRINIVIL,ZESTRIL   meclizine 25 MG tablet Commonly known as:  ANTIVERT   metFORMIN 1000 MG tablet Commonly known as:  GLUCOPHAGE   potassium chloride SA 20 MEQ tablet Commonly known as:  K-DUR,KLOR-CON     TAKE these medications   amoxicillin-clavulanate 875-125 MG tablet Commonly known as:  AUGMENTIN Take 1 tablet by mouth every 12 (twelve) hours.   aspirin EC 81 MG tablet Take 81 mg by mouth daily.   carvedilol 3.125 MG tablet Commonly known as:  COREG Take 1 tablet by mouth 2 (two) times daily.   glimepiride 2 MG tablet Commonly  known as:  AMARYL Take 1 tablet by mouth daily.   glipiZIDE 5 MG tablet Commonly known as:  GLUCOTROL Take 5 mg by mouth daily before breakfast.   hydrALAZINE 50 MG tablet Commonly known as:  APRESOLINE Take 1 tablet (50 mg total) by mouth 3 (three) times daily.   lovastatin 40 MG tablet Commonly known as:  MEVACOR Take 1 tablet by mouth at bedtime.   pyridostigmine 60 MG tablet Commonly known as:  MESTINON Take 1 tablet by mouth every 8 (eight) hours.   torsemide 20 MG tablet Commonly known as:  DEMADEX Take 40 mg in the AM and 20 mg in the PM            Durable Medical Equipment        Start     Ordered   04/10/17 1110  For home use only DME Shower stool  Once     04/10/17 1109     Follow-up Information    Aviva Signs, MD. Schedule an appointment as soon as possible for a visit on 05/01/2017.   Specialty:  General Surgery Contact information: [address] [place] [address] [phone]        Fran Lowes, MD Follow up in 4 week(s).   Specialty:  Nephrology Contact information: [address] [address] [phone]          No Known Allergies  Consultations:  Surgery   Nephrology.    Procedures/Studies: Dg Chest 2 View  Result Date: 04/07/2017 CLINICAL DATA:  Congestion and rales on exam. EXAM: CHEST  2 VIEW COMPARISON:  10/28/2016 FINDINGS: The heart is mildly enlarged. Mild pulmonary vascular congestion is present without frank edema. There are no significant effusions. No focal airspace disease is present. The visualized soft tissues and bony thorax are unremarkable. IMPRESSION: 1. Mild cardiac enlargement and pulmonary vascular congestion. This may represent early congestive heart failure. Electronically Signed   By: San Morelle M.D.   On: 04/07/2017 11:32   US Arterial Seg Single  Result Date: 04/10/2017 CLINICAL DATA:  Previous Abnormal lower extremity ABIs segmental study. Recent left  foot foreign body removal. Diabetes, hypertension, hyperlipidemia, previous tobacco abuse. EXAM: NONINVASIVE PHYSIOLOGIC VASCULAR STUDY OF BILATERAL LOWER EXTREMITIES TECHNIQUE: Evaluation of both lower extremities were performed at rest, including calculation of ankle-brachial indices with single level Doppler, pressure recording. COMPARISON:  01/15/2016 FINDINGS: Right ABI:  0.96, stable Left ABI:  0.74 (previously 0.79) Right Lower Extremity: Biphasic arterial waveform in the dorsalis pedis. Left Lower Extremity:  Monophasic arterial waveforms distally. IMPRESSION: 1. 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 Should the patient fail conservative treatment, consider CTA runoff (higher spatial resolution) or MRA runoff (no radiation risk, can be performed noncontrast in  the setting of renal dysfunction) to better define the site and nature of arterial occlusive disease and delineate treatment options. Electronically Signed   By: Lucrezia Europe M.D.   On: 04/10/2017 10:38   Dg Foot 2 Views Left  Result Date: 04/09/2017 CLINICAL DATA:  Foreign body excision LEFT foot EXAM: LEFT FOOT - 2 VIEW COMPARISON:  None FINDINGS: Lateral and oblique lateral views of the LEFT 4 were obtained intraoperatively. The tiny plantar radiopaque foreign body identified at the level of the first MTP joint is no longer seen. Soft tissue lucency is identified compatible with wound/incision. Diffuse soft tissue swelling LEFT foot. Small plantar and Achilles insertion calcaneal spurs. Scattered intertarsal degenerative changes. No fracture, dislocation or bone destruction. IMPRESSION: Tiny plantar foreign body previous identified at the LEFT forefoot is no longer identified. Electronically Signed   By: Lavonia Dana M.D.   On: 04/09/2017 09:22   Dg Foot Complete Left  Result Date: 04/07/2017 CLINICAL DATA:  Follow-up wound debridement, check for retained foreign body EXAM: LEFT FOOT - COMPLETE 3+ VIEW COMPARISON:   Film from earlier in the same day FINDINGS: There is again noted a a metallic density identified within the soft tissues of the plantar aspect of the foot between the second and third proximal phalanges. No acute fracture or dislocation is noted. A soft tissue defect consistent with the recent treatment is noted. The remainder the exam is stable from the prior study. IMPRESSION: Stable appearing Retained foreign body along the plantar aspect of the foot as described. Electronically Signed   By: Inez Catalina M.D.   On: 04/07/2017 14:49   Dg Foot Complete Left  Result Date: 04/07/2017 CLINICAL DATA:  Puncture wound of the bottom of the foot. EXAM: LEFT FOOT - COMPLETE 3+ VIEW COMPARISON:  None. FINDINGS: There is no evidence of fracture or dislocation. Mild osteoarthritis of the first MTP joint, first TMT joint and talonavicular joint. Enthesopathic changes of the Achilles tendon insertion. Tiny radiopaque foreign body in the soft tissues of the lateral base of the second toe along the plantar aspect. No soft tissue emphysema. IMPRESSION: Tiny radiopaque foreign body in the soft tissues of the lateral base of the second toe along the plantar aspect. No evidence of osteomyelitis. Electronically Signed   By: Kathreen Devoid   On: 04/07/2017 11:13    Subjective:  I am going home today.   Discharge Exam: Vitals:   04/15/17 0507 04/15/17 1418  BP: (!) 157/61 138/60  Pulse: 74 70  Resp: 16 16  Temp: 98.4 F (36.9 C) 98.2 F (36.8 C)   Vitals:   04/14/17 2041 04/14/17 2117 04/15/17 0507 04/15/17 1418  BP:  (!) 166/48 (!) 157/61 138/60  Pulse:  73 74 70  Resp:  16 16 16   Temp:  98.5 F (36.9 C) 98.4 F (36.9 C) 98.2 F (36.8 C)  TempSrc:  Oral Oral Oral  SpO2: 91% 95% 93% 99%  Weight:   97.6 kg (215 lb 3.2 oz)   Height:        General: Pt is alert, awake, not in acute distress Cardiovascular: RRR, S1/S2 +, no rubs, no gallops Respiratory: CTA bilaterally, no wheezing, no rhonchi Abdominal:  Soft, NT, ND, bowel sounds + Extremities: no edema, no cyanosis    The results of significant diagnostics from this hospitalization (including imaging, microbiology, ancillary and laboratory) are listed below for reference.     Microbiology: Recent Results (from the past 240 hour(s))  Culture, blood (routine x 2)  Status: None   Collection Time: 04/07/17 11:40 AM  Result Value Ref Range Status   Specimen Description BLOOD BLOOD LEFT WRIST  Final   Special Requests   Final    BOTTLES DRAWN AEROBIC AND ANAEROBIC Blood Culture adequate volume   Culture NO GROWTH 5 DAYS  Final   Report Status 04/12/2017 FINAL  Final  Culture, blood (routine x 2)     Status: None   Collection Time: 04/07/17 11:40 AM  Result Value Ref Range Status   Specimen Description BLOOD BLOOD LEFT HAND  Final   Special Requests   Final    BOTTLES DRAWN AEROBIC AND ANAEROBIC Blood Culture adequate volume   Culture NO GROWTH 5 DAYS  Final   Report Status 04/12/2017 FINAL  Final  Wound or Superficial Culture     Status: Abnormal   Collection Time: 04/07/17  1:35 PM  Result Value Ref Range Status   Specimen Description FOOT  Final   Special Requests Normal  Final   Gram Stain   Final    FEW WBC PRESENT, PREDOMINANTLY PMN MODERATE GRAM POSITIVE COCCI FEW GRAM POSITIVE RODS RARE GRAM NEGATIVE RODS Performed at [facility], [place] [address], [place], [place] [address]    Culture MULTIPLE ORGANISMS PRESENT, NONE PREDOMINANT (A)  Final   Report Status 04/10/2017 FINAL  Final  Surgical PCR screen     Status: None   Collection Time: 04/08/17  9:43 AM  Result Value Ref Range Status   MRSA, PCR NEGATIVE NEGATIVE Final   Staphylococcus aureus NEGATIVE NEGATIVE Final    Comment:        The Xpert SA Assay (FDA approved for NASAL specimens in patients over 37 years of age), is one component of a comprehensive surveillance program.  Test performance has been validated by [facility] for patients  greater than or equal to 44 year old. It is not intended to diagnose infection nor to guide or monitor treatment.      Recent Labs  10/28/16 0739  BNP 732.2*   Basic Metabolic Panel:  Recent Labs Lab 04/11/17 0511 04/12/17 0442 04/13/17 0704 04/14/17 0650 04/15/17 0559  NA 137 138 139 139 141  K 3.7 3.5 3.9 4.1 4.1  CL 103 103 104 104 106  CO2 26 26 25 25 25   GLUCOSE 111* 174* 182* 134* 197*  BUN 29* 32* 30* 32* 35*  CREATININE 1.63* 1.78* 1.55* 1.44* 1.41*  CALCIUM 8.8* 8.4* 8.6* 8.8* 8.7*  PHOS  --   --   --  3.5 4.3   Liver Function Tests:  Recent Labs Lab 04/14/17 0650 04/15/17 0559  ALBUMIN 2.3* 2.2*   CBC:  Recent Labs Lab 04/09/17 0425 04/10/17 0420 04/11/17 0511 04/12/17 0442 04/15/17 0559  WBC 8.6 8.3 8.7 8.4 8.7  NEUTROABS 5.4  --   --   --   --   HGB 9.2* 9.0* 9.4* 9.4* 8.8*  HCT 27.9* 28.4* 28.8* 27.9* 26.8*  MCV 88.9 91.3 88.9 88.3 88.2  PLT 263 260 336 304 316   CBG:  Recent Labs Lab 04/14/17 1631 04/14/17 2058 04/15/17 0724 04/15/17 1112 04/15/17 1643  GLUCAP 186* 220* 168* 262* 160*   Urinalysis    Component Value Date/Time   COLORURINE STRAW (A) 04/07/2017 1027   APPEARANCEUR CLEAR 04/07/2017 1027   LABSPEC 1.006 04/07/2017 1027   PHURINE 5.0 04/07/2017 [place] 04/07/2017 1027   [place] 04/07/2017 1027   [place] 04/07/2017 1027  [place] NEGATIVE 04/07/2017 [place] 04/07/2017 1027   NITRITE NEGATIVE 04/07/2017 1027   LEUKOCYTESUR NEGATIVE 04/07/2017 1027   Microbiology Recent Results (from the past 240 hour(s))  Culture, blood (routine x 2)     Status: None   Collection Time: 04/07/17 11:40 AM  Result Value Ref Range Status   Specimen Description BLOOD BLOOD LEFT WRIST  Final   Special Requests   Final    BOTTLES DRAWN AEROBIC AND ANAEROBIC Blood Culture adequate volume   Culture NO GROWTH 5 DAYS  Final   Report Status 04/12/2017 FINAL  Final  Culture,  blood (routine x 2)     Status: None   Collection Time: 04/07/17 11:40 AM  Result Value Ref Range Status   Specimen Description BLOOD BLOOD LEFT HAND  Final   Special Requests   Final    BOTTLES DRAWN AEROBIC AND ANAEROBIC Blood Culture adequate volume   Culture NO GROWTH 5 DAYS  Final   Report Status 04/12/2017 FINAL  Final  Wound or Superficial Culture     Status: Abnormal   Collection Time: 04/07/17  1:35 PM  Result Value Ref Range Status   Specimen Description FOOT  Final   Special Requests Normal  Final   Gram Stain   Final    FEW WBC PRESENT, PREDOMINANTLY PMN MODERATE GRAM POSITIVE COCCI FEW GRAM POSITIVE RODS RARE GRAM NEGATIVE RODS Performed at [facility], [place] [address], [place], [place] [address]    Culture MULTIPLE ORGANISMS PRESENT, NONE PREDOMINANT (A)  Final   Report Status 04/10/2017 FINAL  Final  Surgical PCR screen     Status: None   Collection Time: 04/08/17  9:43 AM  Result Value Ref Range Status   MRSA, PCR NEGATIVE NEGATIVE Final   Staphylococcus aureus NEGATIVE NEGATIVE Final    Comment:        The Xpert SA Assay (FDA approved for NASAL specimens in patients over 57 years of age), is one component of a comprehensive surveillance program.  Test performance has been validated by [facility] for patients greater than or equal to 45 year old. It is not intended to diagnose infection nor to guide or monitor treatment.     Time coordinating discharge: Over 30 minutes SIGNED:  Orvan Falconer, MD FACP [facility] 04/15/2017, 6:04 PM   If 7PM-7AM, please contact night-coverage [email or web address] Password [email or web address]

## 2017-04-15 NOTE — Progress Notes (Signed)
6 Days Post-Op  Subjective: Patient has no complaints.  Objective: Vital signs in last 24 hours: Temp:  [98.4 F (36.9 C)-98.5 F (36.9 C)] 98.4 F (36.9 C) (07/31 0507) Pulse Rate:  [72-74] 74 (07/31 0507) Resp:  [16] 16 (07/31 0507) BP: (146-166)/(48-61) 157/61 (07/31 0507) SpO2:  [91 %-98 %] 93 % (07/31 0507) Weight:  [215 lb 3.2 oz (97.6 kg)] 215 lb 3.2 oz (97.6 kg) (07/31 0507) Last BM Date: 04/13/17  Intake/Output from previous day: 07/30 0701 - 07/31 0700 In: 1782.9 [P.O.:720; I.V.:1062.9] Out: -  Intake/Output this shift: No intake/output data recorded.  General appearance: alert, cooperative and no distress Extremities: Left foot wound healing well. Left second toe slightly less ischemic looking, stable bulla present.  Lab Results:   Recent Labs  04/15/17 0559  WBC 8.7  HGB 8.8*  HCT 26.8*  PLT 316   BMET  Recent Labs  04/14/17 0650 04/15/17 0559  NA 139 141  K 4.1 4.1  CL 104 106  CO2 25 25  GLUCOSE 134* 197*  BUN 32* 35*  CREATININE 1.44* 1.41*  CALCIUM 8.8* 8.7*   PT/INR No results for input(s): LABPROT, INR in the last 72 hours.  Studies/Results: No results found.  Anti-infectives: Anti-infectives    Start     Dose/Rate Route Frequency Ordered Stop   04/11/17 0800  cefTRIAXone (ROCEPHIN) 1 g in dextrose 5 % 50 mL IVPB     1 g 100 mL/hr over 30 Minutes Intravenous Every 24 hours 04/10/17 1357     04/09/17 0600  vancomycin (VANCOCIN) IVPB 1000 mg/200 mL premix  Status:  Discontinued     1,000 mg 200 mL/hr over 60 Minutes Intravenous Every 12 hours 04/08/17 1306 04/11/17 0914   04/08/17 1500  vancomycin (VANCOCIN) 1,500 mg in sodium chloride 0.9 % 500 mL IVPB     1,500 mg 250 mL/hr over 120 Minutes Intravenous  Once 04/08/17 1306 04/08/17 1908   04/08/17 1400  piperacillin-tazobactam (ZOSYN) IVPB 3.375 g  Status:  Discontinued     3.375 g 12.5 mL/hr over 240 Minutes Intravenous Every 8 hours 04/08/17 1300 04/10/17 1357   04/08/17 1000   cefTRIAXone (ROCEPHIN) 1 g in dextrose 5 % 50 mL IVPB  Status:  Discontinued     1 g 100 mL/hr over 30 Minutes Intravenous Every 24 hours 04/07/17 1714 04/08/17 1245   04/08/17 1000  acyclovir (ZOVIRAX) 200 MG capsule 800 mg     800 mg Oral Daily 04/07/17 1846     04/07/17 1715  acyclovir (ZOVIRAX) tablet 800 mg  Status:  Discontinued     800 mg Oral Daily 04/07/17 1701 04/07/17 1845   04/07/17 1215  cefTRIAXone (ROCEPHIN) 1 g in dextrose 5 % 50 mL IVPB     1 g 100 mL/hr over 30 Minutes Intravenous  Once 04/07/17 1210 04/07/17 1454   04/07/17 1215  piperacillin-tazobactam (ZOSYN) IVPB 3.375 g     3.375 g 100 mL/hr over 30 Minutes Intravenous  Once 04/07/17 1210 04/07/17 1337      Assessment/Plan: s/p Procedure(s): FOREIGN BODY REMOVAL ADULT FOOT Impression: Has healed well from surgery. We will monitor left second toe. She is to follow-up in my office in 2 weeks.  LOS: 8 days    Aviva Signs 04/15/2017

## 2017-04-15 NOTE — Progress Notes (Signed)
Subjective: Interval History: Patient presently denies any nausea or vomiting. Her appetite is good. Denies also any difficulty breathing.  Objective: Vital signs in last 24 hours: Temp:  [98.4 F (36.9 C)-98.5 F (36.9 C)] 98.4 F (36.9 C) (07/31 0507) Pulse Rate:  [72-74] 74 (07/31 0507) Resp:  [16] 16 (07/31 0507) BP: (146-166)/(48-61) 157/61 (07/31 0507) SpO2:  [91 %-98 %] 93 % (07/31 0507) Weight:  [97.6 kg (215 lb 3.2 oz)] 97.6 kg (215 lb 3.2 oz) (07/31 0507) Weight change: 2.177 kg (4 lb 12.8 oz)  Intake/Output from previous day: 07/30 0701 - 07/31 0700 In: 1782.9 [P.O.:720; I.V.:1062.9] Out: -  Intake/Output this shift: No intake/output data recorded.  General appearance: alert, cooperative and no distress Resp: clear to auscultation bilaterally Cardio: regular rate and rhythm, S1, S2 normal, no murmur, click, rub or gallop Extremities: no edema, redness or tenderness in the calves or thighs  Lab Results:  Recent Labs  04/15/17 0559  WBC 8.7  HGB 8.8*  HCT 26.8*  PLT 316   BMET:   Recent Labs  04/14/17 0650 04/15/17 0559  NA 139 141  K 4.1 4.1  CL 104 106  CO2 25 25  GLUCOSE 134* 197*  BUN 32* 35*  CREATININE 1.44* 1.41*  CALCIUM 8.8* 8.7*   No results for input(Contreras): PTH in the last 72 hours. Iron Studies: No results for input(Contreras): IRON, TIBC, TRANSFERRIN, FERRITIN in the last 72 hours.  Studies/Results: No results found.  I have reviewed the patient'Contreras current medications.  Assessment/Plan: Problem #1 acute kidney injury: Her renal function is Progressively improving. Patient presently remains asymptomatic. Problem #2 hypertension: Her blood pressure is reasonably controlled Problem #3 history of diabetes: Her blood sugar is reasonably controlled Problem #4 anemia: Her iron saturation is 11% and ferritin is 25 most likely iron deficiency anemia Problem #5 cellulitis of her left foot: Presently she is on antibiotics. Patient is a febrile and her  white blood cell count is normal. Problem #6 bone and mineral disorder: Her calcium and phosphorous is his range. Problem #7 PAD Plan: We'll DC IV fluid We'll check renal panel in the morning. We'll see patient in 4 weeks when he is discharged.  LOS: 8 days   Natalie Contreras 04/15/2017,8:39 AM

## 2017-04-15 NOTE — Progress Notes (Signed)
Discharge instructions read to patient and family .  All verbalized understanding of instructions.  Discharged to home with family. 

## 2017-05-01 ENCOUNTER — Encounter: Payer: Self-pay | Admitting: General Surgery

## 2017-05-01 ENCOUNTER — Encounter (HOSPITAL_COMMUNITY): Payer: Self-pay

## 2017-05-01 ENCOUNTER — Encounter (HOSPITAL_COMMUNITY)
Admission: RE | Admit: 2017-05-01 | Discharge: 2017-05-01 | Disposition: A | Discharge status: Home / Self Care | Payer: BLUE CROSS/BLUE SHIELD | Source: Ambulatory Visit | Attending: General Surgery | Admitting: General Surgery

## 2017-05-01 ENCOUNTER — Ambulatory Visit (INDEPENDENT_AMBULATORY_CARE_PROVIDER_SITE_OTHER): Payer: Self-pay | Admitting: General Surgery

## 2017-05-01 VITALS — BP 156/102 | HR 72 | Temp 97.8°F | Resp 18 | Ht 64.0 in | Wt 212.0 lb

## 2017-05-01 DIAGNOSIS — I96 Gangrene, not elsewhere classified: Secondary | ICD-10-CM

## 2017-05-01 DIAGNOSIS — Z01812 Encounter for preprocedural laboratory examination: Secondary | ICD-10-CM | POA: Insufficient documentation

## 2017-05-01 LAB — CBC WITH DIFFERENTIAL/PLATELET
Basophils Absolute: 0.1 10*3/uL (ref 0.0–0.1)
Basophils Relative: 1 %
EOS ABS: 0.2 10*3/uL (ref 0.0–0.7)
Eosinophils Relative: 3 %
HCT: 29.2 % — ABNORMAL LOW (ref 36.0–46.0)
HEMOGLOBIN: 9.5 g/dL — AB (ref 12.0–15.0)
LYMPHS ABS: 1.8 10*3/uL (ref 0.7–4.0)
Lymphocytes Relative: 23 %
MCH: 28.9 pg (ref 26.0–34.0)
MCHC: 32.5 g/dL (ref 30.0–36.0)
MCV: 88.8 fL (ref 78.0–100.0)
MONOS PCT: 6 %
Monocytes Absolute: 0.5 10*3/uL (ref 0.1–1.0)
NEUTROS PCT: 67 %
Neutro Abs: 5 10*3/uL (ref 1.7–7.7)
Platelets: 388 10*3/uL (ref 150–400)
RBC: 3.29 MIL/uL — ABNORMAL LOW (ref 3.87–5.11)
RDW: 14.9 % (ref 11.5–15.5)
WBC: 7.5 10*3/uL (ref 4.0–10.5)

## 2017-05-01 LAB — BASIC METABOLIC PANEL
Anion gap: 12 (ref 5–15)
BUN: 26 mg/dL — AB (ref 6–20)
CHLORIDE: 99 mmol/L — AB (ref 101–111)
CO2: 28 mmol/L (ref 22–32)
CREATININE: 1.23 mg/dL — AB (ref 0.44–1.00)
Calcium: 8.7 mg/dL — ABNORMAL LOW (ref 8.9–10.3)
GFR calc non Af Amer: 51 mL/min — ABNORMAL LOW (ref >60)
GFR, EST AFRICAN AMERICAN: 59 mL/min — AB (ref >60)
Glucose, Bld: 172 mg/dL — ABNORMAL HIGH (ref 65–99)
Potassium: 3.6 mmol/L (ref 3.5–5.1)
Sodium: 139 mmol/L (ref 135–145)

## 2017-05-01 LAB — HEMOGLOBIN A1C
HEMOGLOBIN A1C: 7 % — AB (ref 4.8–5.6)
Mean Plasma Glucose: 154.2 mg/dL

## 2017-05-01 LAB — SURGICAL PCR SCREEN
MRSA, PCR: INVALID — AB
Staphylococcus aureus: INVALID — AB

## 2017-05-01 NOTE — H&P (Signed)
Natalie Contreras is an 51 y.o. female.   Chief Complaint: Gangrene, left second toe HPI: Patient is a 51 year old white female who was recently hospitalized for cellulitis of the left foot with foreign body removal of the left foot are now presents with dry gangrene of the left second toe. This was not unexpected. She has finished her antibiotic course. She currently has no pain. She now presents for a left second toe amputation.  Past Medical History:  Diagnosis Date  . Acute diastolic CHF (congestive heart failure) ([facility]) 10/28/2016  . Carotid stenosis, asymptomatic, bilateral 01/15/2017  . Diabetes mellitus without complication ([place])   . Heart murmur   . Hypertension   . PAD (peripheral artery disease) ([facility]) 04/10/2017   Left ABI 0.74. Right ABI 0.96.    Past Surgical History:  Procedure Laterality Date  . CATARACT EXTRACTION W/PHACO Left 02/08/2016   Procedure: CATARACT EXTRACTION PHACO AND INTRAOCULAR LENS PLACEMENT (IOC);  Surgeon: Tonny Branch, MD;  Location: [facility];  Service: Ophthalmology;  Laterality: Left;  CDE 11.43   . CATARACT EXTRACTION W/PHACO Right 02/26/2016   Procedure: CATARACT EXTRACTION PHACO AND INTRAOCULAR LENS PLACEMENT RIGHT; CDE:  16.90;  Surgeon: Tonny Branch, MD;  Location: [facility];  Service: Ophthalmology;  Laterality: Right;  . CHOLECYSTECTOMY    . FOREIGN BODY REMOVAL Left 04/09/2017   Procedure: FOREIGN BODY REMOVAL ADULT FOOT;  Surgeon: Aviva Signs, MD;  Location: [facility];  Service: General;  Laterality: Left;    Family History  Problem Relation Age of Onset  . Lung cancer Mother   . Lung cancer Father   . AAA (abdominal aortic aneurysm) Brother    Social History:  reports that she has been smoking Cigarettes.  She started smoking about 32 years ago. She has a 18.75 pack-year smoking history. She uses smokeless tobacco. She reports that she does not drink alcohol or use drugs.  Allergies: No Known Allergies  No prescriptions prior to admission.    No  results found for this or any previous visit (from the past 48 hour(s)). No results found.  Review of Systems  Constitutional: Negative.   HENT: Negative.   Eyes: Negative.   Respiratory: Negative.   Cardiovascular: Negative.   Gastrointestinal: Negative.   Genitourinary: Negative.   Skin: Negative.   Neurological: Negative.   Endo/Heme/Allergies: Negative.   Psychiatric/Behavioral: Negative.     Last menstrual period 12/16/2015. Physical Exam  Vitals reviewed. Constitutional: She is oriented to person, place, and time. She appears well-developed and well-nourished.  HENT:  Head: Normocephalic and atraumatic.  Cardiovascular: Normal rate, regular rhythm and normal heart sounds.   No murmur heard. Respiratory: Effort normal and breath sounds normal. She has no wheezes. She has no rales.  Musculoskeletal:  Dry gangrene with mummification of left second toe. Mild erythema with skin breakdown at base of left second toe. No purulent drainage noted. Peripheral pulses palpable.  Neurological: She is alert and oriented to person, place, and time.  Skin: Skin is warm and dry.    ABI results reviewed. Assessment/Plan Impression: Dry gangrene of left second toe, diabetes mellitus Plan: Patient is scheduled for left second toe amputation on 05/05/2017. The risks and benefits of the procedure including bleeding, infection, and wound breakdown were fully explained to the patient, who gave informed consent. Aviva Signs, MD 05/01/2017, 10:48 AM

## 2017-05-01 NOTE — Patient Instructions (Signed)
Natalie Contreras  05/01/2017     @PREFPERIOPPHARMACY @   Your procedure is scheduled on  05/05/2017 .  Report to [facility] at  800  A.M.  Call this number if you have problems the morning of surgery:  [phone]   Remember:  Do not eat food or drink liquids after midnight.  Take these medicines the morning of surgery with A SIP OF WATER  Coreg, mestinon.   Do not wear jewelry, make-up or nail polish.  Do not wear lotions, powders, or perfumes, or deoderant.  Do not shave 48 hours prior to surgery.  Men may shave face and neck.  Do not bring valuables to the hospital.  [facility] is not responsible for any belongings or valuables.  Contacts, dentures or bridgework may not be worn into surgery.  Leave your suitcase in the car.  After surgery it may be brought to your room.  For patients admitted to the hospital, discharge time will be determined by your treatment team.  Patients discharged the day of surgery will not be allowed to drive home.   Name and phone number of your driver:   family Special instructions:  None  Please read over the following fact sheets that you were given. Anesthesia Post-op Instructions and Care and Recovery After Surgery      Toe Amputation Toe amputation is a surgical procedure to remove all or part of a toe. You may have this procedure if:  Tissue in your toe is dying because of poor blood supply.  You have a severe infection in your toe.  Removing your toe keeps nearby tissue healthy. If the toe is infected, removing it helps to keep the infection from spreading. Tell a health care provider about:  Any allergies you have.  All medicines you are taking, including vitamins, herbs, eye drops, creams, and over-the-counter medicines.  Any problems you or family members have had with anesthetic medicines.  Any blood disorders you have.  Any surgeries you have had.  Any medical conditions you have.  Whether you are  pregnant or may be pregnant. What are the risks? Generally, this is a safe procedure. However, problems may occur, including:  Bleeding.  Buildup of blood and fluid (hematoma).  Infection.  Allergic reactions to medicines.  Tissue death in the flap of skin (flap necrosis).  Trouble with healing.  Minor changes in the way that you walk (your gait).  Feeling pain in the area that was removed (phantom pain). This is rare.  What happens before the procedure?  Follow instructions from your health care provider about eating or drinking restrictions.  Ask your health care provider about: ? Changing or stopping your regular medicines. This is especially important if you are taking diabetes medicines or blood thinners. ? Taking medicines such as aspirin and ibuprofen. These medicines can thin your blood. Do not take these medicines before your procedure if your health care provider instructs you not to.  You may be given antibiotic medicine to help prevent infection.  Plan to have someone take you home after the procedure.  If you will be going home right after the procedure, plan to have someone with you for 24 hours. What happens during the procedure?  You will be given one or more of the following: ? A medicine to help you relax (sedative). ? A medicine to numb the area (local anesthetic). ? A medicine to make you fall asleep (general  anesthetic). ? A medicine that is injected into your spine to numb the area below and slightly above the injection site (spinal anesthetic). ? A medicine that is injected into an area of your body to numb everything below the injection site (regional anesthetic).  To reduce your risk of infection: ? Your health care team will wash or sanitize their hands. ? Your skin will be washed with soap.  Your surgeon will mark the area of your toe for removal.  Your surgeon will make a surgical cut (incision) in your toe.  The dead tissue and bone will  be removed.  Nerves and vessels will be tied or heated with a special tool to stop bleeding.  The area will be drained and cleaned.  If only part of a toe is removed, the remaining part will be covered with a flap of skin.  The incision will be treated in one of these ways: ? It will be closed with stitches (sutures). ? It will be left open to heal if there is an infection.  The incision area may be packed with gauze and covered with bandages (dressings).  Tissue samples may be sent to a lab to be examined under a microscope. The procedure may vary among health care providers and hospitals. What happens after the procedure?  Your blood pressure, heart rate, breathing rate, and blood oxygen level will be monitored often until the medicines you were given have worn off.  Your foot will be raised up high (elevated) to relieve swelling.  You will be monitored for pain.  You will be given pain medicines and antibiotics.  Your health care provider or physical therapist will help you to move around as soon as possible.  Do not drive for 24 hours if you received a sedative. This information is not intended to replace advice given to you by your health care provider. Make sure you discuss any questions you have with your health care provider. Document Released: 08/14/2015 Document Revised: 05/06/2016 Document Reviewed: 05/27/2015 Elsevier Interactive Patient Education  2018 [place].  Toe Amputation, Care After Refer to this sheet in the next few weeks. These instructions provide you with information on caring for yourself after your procedure. Your health care provider may also give you more specific instructions. Your treatment has been planned according to current medical practices, but problems sometimes occur. Call your health care provider if you have any problems or questions after your procedure. What can I expect after the procedure? After your procedure, it is common to have  some pain. Pain usually improves within a week. Follow these instructions at home: Medicines  Take your antibiotic medicine as told by your health care provider. Do not stop taking the antibiotic even if you start to feel better.  Take over-the-counter and prescription medicines only as told by your health care provider. Managing pain, stiffness, and swelling   If directed, apply ice to the surgical area: ? Put ice in a plastic bag. ? Place a towel between your skin and the bag. ? Leave the ice on for 20 minutes, 2-3 times a day.  Raise (elevate) your foot so it is above the level of your heart. This helps to reduce swelling.  Try to walk each day. Incision care   Follow instructions from your health care provider about how to take care of your cut from surgery (incision). Make sure you: ? Wash your hands with soap and water before you change your bandage (dressing). If soap and  water are not available, use hand sanitizer. ? Change your dressing as told by your health care provider. ? If you got stitches (sutures), leave them in place. They may need to stay in place for 2 weeks or longer.  Check your incision area every day for signs of infection. Check for: ? More redness, swelling, or pain. ? More fluid or blood. ? Warmth. ? Pus or a bad smell. Bathing  Do not take baths, swim, use a hot tub, or soak your foot until your health care provider approves.  You may shower unless you were told not to. When you shower, keep your dressing dry.  If your dressing has been removed, you may wash your skin with warm water and soap. Driving  Do not drive for 24 hours if you received a sedative.  Do not drive or operate heavy machinery while taking prescription pain medicine. Activity  Do exercises as told by your health care provider.  Return to your normal activities as told by your health care provider. Ask your health care provider what activities are safe for you. General  instructions  Do not use any tobacco products, such as cigarettes, chewing tobacco, and e-cigarettes. If you need help quitting, ask your health care provider.  Ask your health care provider about wearing special shoes or using inserts to support your foot.  If you have diabetes, keep your blood sugar under control.  Keep all follow-up visits as told by your health care provider. This is important. Contact a health care provider if:  You have more redness around your incision.  You have more fluid or blood coming from your incision.  Your incision feels warm to the touch.  You have pus or a bad smell coming from your incision.  You have a fever.  Your dressing is soaked with blood.  Your sutures tear or they separate.  You have numbness or tingling in your toes or foot.  Your foot is cool or pale, or it changes color.  Your pain does not improve after you take your medicine. Get help right away if:  You have pain or swelling that gets worse or does not go away.  You have red streaks on your skin near your toes, foot, or leg.  You have pain in your calf or behind your knee.  You have shortness of breath.  You have chest pain. This information is not intended to replace advice given to you by your health care provider. Make sure you discuss any questions you have with your health care provider. Document Released: 08/14/2015 Document Revised: 05/06/2016 Document Reviewed: 05/27/2015 Elsevier Interactive Patient Education  2018 [place] Anesthesia, Adult General anesthesia is the use of medicines to make a person "go to sleep" (be unconscious) for a medical procedure. General anesthesia is often recommended when a procedure:  Is long.  Requires you to be still or in an unusual position.  Is major and can cause you to lose blood.  Is impossible to do without general anesthesia.  The medicines used for general anesthesia are called general anesthetics.  In addition to making you sleep, the medicines:  Prevent pain.  Control your blood pressure.  Relax your muscles.  Tell a health care provider about:  Any allergies you have.  All medicines you are taking, including vitamins, herbs, eye drops, creams, and over-the-counter medicines.  Any problems you or family members have had with anesthetic medicines.  Types of anesthetics you have had in the  past.  Any bleeding disorders you have.  Any surgeries you have had.  Any medical conditions you have.  Any history of heart or lung conditions, such as heart failure, sleep apnea, or chronic obstructive pulmonary disease (COPD).  Whether you are pregnant or may be pregnant.  Whether you use tobacco, alcohol, marijuana, or street drugs.  Any history of Armed forces logistics/support/administrative officer.  Any history of depression or anxiety. What are the risks? Generally, this is a safe procedure. However, problems may occur, including:  Allergic reaction to anesthetics.  Lung and heart problems.  Inhaling food or liquids from your stomach into your lungs (aspiration).  Injury to nerves.  Waking up during your procedure and being unable to move (rare).  Extreme agitation or a state of mental confusion (delirium) when you wake up from the anesthetic.  Air in the bloodstream, which can lead to stroke.  These problems are more likely to develop if you are having a major surgery or if you have an advanced medical condition. You can prevent some of these complications by answering all of your health care provider's questions thoroughly and by following all pre-procedure instructions. General anesthesia can cause side effects, including:  Nausea or vomiting  A sore throat from the breathing tube.  Feeling cold or shivery.  Feeling tired, washed out, or achy.  Sleepiness or drowsiness.  Confusion or agitation.  What happens before the procedure? Staying hydrated Follow instructions from your health  care provider about hydration, which may include:  Up to 2 hours before the procedure - you may continue to drink clear liquids, such as water, clear fruit juice, black coffee, and plain tea.  Eating and drinking restrictions Follow instructions from your health care provider about eating and drinking, which may include:  8 hours before the procedure - stop eating heavy meals or foods such as meat, fried foods, or fatty foods.  6 hours before the procedure - stop eating light meals or foods, such as toast or cereal.  6 hours before the procedure - stop drinking milk or drinks that contain milk.  2 hours before the procedure - stop drinking clear liquids.  Medicines  Ask your health care provider about: ? Changing or stopping your regular medicines. This is especially important if you are taking diabetes medicines or blood thinners. ? Taking medicines such as aspirin and ibuprofen. These medicines can thin your blood. Do not take these medicines before your procedure if your health care provider instructs you not to. ? Taking new dietary supplements or medicines. Do not take these during the week before your procedure unless your health care provider approves them.  If you are told to take a medicine or to continue taking a medicine on the day of the procedure, take the medicine with sips of water. General instructions   Ask if you will be going home the same day, the following day, or after a longer hospital stay. ? Plan to have someone take you home. ? Plan to have someone stay with you for the first 24 hours after you leave the hospital or clinic.  For 3-6 weeks before the procedure, try not to use any tobacco products, such as cigarettes, chewing tobacco, and e-cigarettes.  You may brush your teeth on the morning of the procedure, but make sure to spit out the toothpaste. What happens during the procedure?  You will be given anesthetics through a mask and through an IV tube in  one of your veins.  You may  receive medicine to help you relax (sedative).  As soon as you are asleep, a breathing tube may be used to help you breathe.  An anesthesia specialist will stay with you throughout the procedure. He or she will help keep you comfortable and safe by continuing to give you medicines and adjusting the amount of medicine that you get. He or she will also watch your blood pressure, pulse, and oxygen levels to make sure that the anesthetics do not cause any problems.  If a breathing tube was used to help you breathe, it will be removed before you wake up. The procedure may vary among health care providers and hospitals. What happens after the procedure?  You will wake up, often slowly, after the procedure is complete, usually in a recovery area.  Your blood pressure, heart rate, breathing rate, and blood oxygen level will be monitored until the medicines you were given have worn off.  You may be given medicine to help you calm down if you feel anxious or agitated.  If you will be going home the same day, your health care provider may check to make sure you can stand, drink, and urinate.  Your health care providers will treat your pain and side effects before you go home.  Do not drive for 24 hours if you received a sedative.  You may: ? Feel nauseous and vomit. ? Have a sore throat. ? Have mental slowness. ? Feel cold or shivery. ? Feel sleepy. ? Feel tired. ? Feel sore or achy, even in parts of your body where you did not have surgery. This information is not intended to replace advice given to you by your health care provider. Make sure you discuss any questions you have with your health care provider. Document Released: 12/10/2007 Document Revised: 02/13/2016 Document Reviewed: 08/17/2015 Elsevier Interactive Patient Education  2018 [place] Anesthesia, Adult, Care After These instructions provide you with information about caring for  yourself after your procedure. Your health care provider may also give you more specific instructions. Your treatment has been planned according to current medical practices, but problems sometimes occur. Call your health care provider if you have any problems or questions after your procedure. What can I expect after the procedure? After the procedure, it is common to have:  Vomiting.  A sore throat.  Mental slowness.  It is common to feel:  Nauseous.  Cold or shivery.  Sleepy.  Tired.  Sore or achy, even in parts of your body where you did not have surgery.  Follow these instructions at home: For at least 24 hours after the procedure:  Do not: ? Participate in activities where you could fall or become injured. ? Drive. ? Use heavy machinery. ? Drink alcohol. ? Take sleeping pills or medicines that cause drowsiness. ? Make important decisions or sign legal documents. ? Take care of children on your own.  Rest. Eating and drinking  If you vomit, drink water, juice, or soup when you can drink without vomiting.  Drink enough fluid to keep your urine clear or pale yellow.  Make sure you have little or no nausea before eating solid foods.  Follow the diet recommended by your health care provider. General instructions  Have a responsible adult stay with you until you are awake and alert.  Return to your normal activities as told by your health care provider. Ask your health care provider what activities are safe for you.  Take over-the-counter and prescription medicines only as  told by your health care provider.  If you smoke, do not smoke without supervision.  Keep all follow-up visits as told by your health care provider. This is important. Contact a health care provider if:  You continue to have nausea or vomiting at home, and medicines are not helpful.  You cannot drink fluids or start eating again.  You cannot urinate after 8-12 hours.  You develop a  skin rash.  You have fever.  You have increasing redness at the site of your procedure. Get help right away if:  You have difficulty breathing.  You have chest pain.  You have unexpected bleeding.  You feel that you are having a life-threatening or urgent problem. This information is not intended to replace advice given to you by your health care provider. Make sure you discuss any questions you have with your health care provider. Document Released: 12/09/2000 Document Revised: 02/05/2016 Document Reviewed: 08/17/2015 Elsevier Interactive Patient Education  [employer].

## 2017-05-01 NOTE — Progress Notes (Signed)
Subjective:     Natalie Contreras  History for follow-up hospitalization of cellulitis of the left second toe along with removal of foreign body. Patient states she has no pain in the left second toe. There has been serous drainage present. No purulent drainage noted. Objective:    BP (!) 156/102   Pulse 72   Temp 97.8 F (36.6 C)   Resp 18   Ht 5\' 4"  (1.626 m)   Wt 212 lb (96.2 kg)   LMP 12/16/2015 Comment: irregular  BMI 36.39 kg/m   General:  alert, cooperative and no distress  Left second toe with dry gangrene distally. Erythema and mild swelling is noted proximally. No purulent drainage noted.     Assessment:    Dry gangrene of left second toe, diabetes mellitus    Plan:   Patient is scheduled for left second toe amputation on 05/05/2017. The risks and benefits of the procedure including wound breakdown were fully explained to the patient, who gave informed consent.

## 2017-05-01 NOTE — Patient Instructions (Signed)
Toe Amputation Toe amputation is a surgical procedure to remove all or part of a toe. You may have this procedure if:  Tissue in your toe is dying because of poor blood supply.  You have a severe infection in your toe.  Removing your toe keeps nearby tissue healthy. If the toe is infected, removing it helps to keep the infection from spreading. Tell a health care provider about:  Any allergies you have.  All medicines you are taking, including vitamins, herbs, eye drops, creams, and over-the-counter medicines.  Any problems you or family members have had with anesthetic medicines.  Any blood disorders you have.  Any surgeries you have had.  Any medical conditions you have.  Whether you are pregnant or may be pregnant. What are the risks? Generally, this is a safe procedure. However, problems may occur, including:  Bleeding.  Buildup of blood and fluid (hematoma).  Infection.  Allergic reactions to medicines.  Tissue death in the flap of skin (flap necrosis).  Trouble with healing.  Minor changes in the way that you walk (your gait).  Feeling pain in the area that was removed (phantom pain). This is rare.  What happens before the procedure?  Follow instructions from your health care provider about eating or drinking restrictions.  Ask your health care provider about: ? Changing or stopping your regular medicines. This is especially important if you are taking diabetes medicines or blood thinners. ? Taking medicines such as aspirin and ibuprofen. These medicines can thin your blood. Do not take these medicines before your procedure if your health care provider instructs you not to.  You may be given antibiotic medicine to help prevent infection.  Plan to have someone take you home after the procedure.  If you will be going home right after the procedure, plan to have someone with you for 24 hours. What happens during the procedure?  You will be given one or more  of the following: ? A medicine to help you relax (sedative). ? A medicine to numb the area (local anesthetic). ? A medicine to make you fall asleep (general anesthetic). ? A medicine that is injected into your spine to numb the area below and slightly above the injection site (spinal anesthetic). ? A medicine that is injected into an area of your body to numb everything below the injection site (regional anesthetic).  To reduce your risk of infection: ? Your health care team will wash or sanitize their hands. ? Your skin will be washed with soap.  Your surgeon will Maneh Sieben the area of your toe for removal.  Your surgeon will make a surgical cut (incision) in your toe.  The dead tissue and bone will be removed.  Nerves and vessels will be tied or heated with a special tool to stop bleeding.  The area will be drained and cleaned.  If only part of a toe is removed, the remaining part will be covered with a flap of skin.  The incision will be treated in one of these ways: ? It will be closed with stitches (sutures). ? It will be left open to heal if there is an infection.  The incision area may be packed with gauze and covered with bandages (dressings).  Tissue samples may be sent to a lab to be examined under a microscope. The procedure may vary among health care providers and hospitals. What happens after the procedure?  Your blood pressure, heart rate, breathing rate, and blood oxygen level will be monitored   often until the medicines you were given have worn off.  Your foot will be raised up high (elevated) to relieve swelling.  You will be monitored for pain.  You will be given pain medicines and antibiotics.  Your health care provider or physical therapist will help you to move around as soon as possible.  Do not drive for 24 hours if you received a sedative. This information is not intended to replace advice given to you by your health care provider. Make sure you discuss  any questions you have with your health care provider. Document Released: 08/14/2015 Document Revised: 05/06/2016 Document Reviewed: 05/27/2015 Elsevier Interactive Patient Education  2018 [employer]  

## 2017-05-02 LAB — MRSA CULTURE: CULTURE: NOT DETECTED

## 2017-05-05 ENCOUNTER — Inpatient Hospital Stay (HOSPITAL_COMMUNITY): Payer: BLUE CROSS/BLUE SHIELD | Admitting: Anesthesiology

## 2017-05-05 ENCOUNTER — Encounter (HOSPITAL_COMMUNITY)
Admission: RE | Disposition: A | Discharge status: Home / Self Care | Payer: Self-pay | Source: Ambulatory Visit | Attending: General Surgery

## 2017-05-05 ENCOUNTER — Encounter (HOSPITAL_COMMUNITY): Payer: Self-pay

## 2017-05-05 ENCOUNTER — Observation Stay (HOSPITAL_COMMUNITY)
Admission: RE | Admit: 2017-05-05 | Discharge: 2017-05-06 | Disposition: A | Discharge status: Home / Self Care | Payer: BLUE CROSS/BLUE SHIELD | Source: Ambulatory Visit | Attending: General Surgery | Admitting: General Surgery

## 2017-05-05 DIAGNOSIS — E1165 Type 2 diabetes mellitus with hyperglycemia: Secondary | ICD-10-CM | POA: Insufficient documentation

## 2017-05-05 DIAGNOSIS — Z7984 Long term (current) use of oral hypoglycemic drugs: Secondary | ICD-10-CM | POA: Insufficient documentation

## 2017-05-05 DIAGNOSIS — E1152 Type 2 diabetes mellitus with diabetic peripheral angiopathy with gangrene: Secondary | ICD-10-CM | POA: Diagnosis not present

## 2017-05-05 DIAGNOSIS — I272 Pulmonary hypertension, unspecified: Secondary | ICD-10-CM | POA: Insufficient documentation

## 2017-05-05 DIAGNOSIS — Z7982 Long term (current) use of aspirin: Secondary | ICD-10-CM | POA: Insufficient documentation

## 2017-05-05 DIAGNOSIS — I96 Gangrene, not elsewhere classified: Secondary | ICD-10-CM | POA: Diagnosis not present

## 2017-05-05 DIAGNOSIS — I10 Essential (primary) hypertension: Secondary | ICD-10-CM | POA: Insufficient documentation

## 2017-05-05 DIAGNOSIS — Z79899 Other long term (current) drug therapy: Secondary | ICD-10-CM | POA: Insufficient documentation

## 2017-05-05 DIAGNOSIS — F17210 Nicotine dependence, cigarettes, uncomplicated: Secondary | ICD-10-CM | POA: Insufficient documentation

## 2017-05-05 DIAGNOSIS — IMO0002 Reserved for concepts with insufficient information to code with codable children: Secondary | ICD-10-CM

## 2017-05-05 HISTORY — PX: AMPUTATION TOE: SHX6595

## 2017-05-05 LAB — GLUCOSE, CAPILLARY
GLUCOSE-CAPILLARY: 77 mg/dL (ref 65–99)
GLUCOSE-CAPILLARY: 109 mg/dL — AB (ref 65–99)
GLUCOSE-CAPILLARY: 100 mg/dL — AB (ref 65–99)
GLUCOSE-CAPILLARY: 133 mg/dL — AB (ref 65–99)

## 2017-05-05 SURGERY — AMPUTATION, TOE
Anesthesia: Monitor Anesthesia Care | Site: Second Toe | Laterality: Left

## 2017-05-05 MED ORDER — ENOXAPARIN SODIUM 40 MG/0.4ML ~~LOC~~ SOLN: 40.0000 mg | SUBCUTANEOUS | Status: DC

## 2017-05-05 MED ORDER — FENTANYL CITRATE (PF) 100 MCG/2ML IJ SOLN
INTRAMUSCULAR | Status: AC
  Filled 2017-05-05: qty 2

## 2017-05-05 MED ORDER — TORSEMIDE 20 MG PO TABS
40.0000 mg | ORAL_TABLET | Freq: Two times a day (BID) | ORAL | Status: DC
  Administered 2017-05-05 – 2017-05-06 (×2): 40 mg via ORAL
  Filled 2017-05-05 (×2): qty 2

## 2017-05-05 MED ORDER — KETOROLAC TROMETHAMINE 30 MG/ML IJ SOLN
30.0000 mg | Freq: Four times a day (QID) | INTRAMUSCULAR | Status: DC | PRN
Start: 2017-05-05 — End: 2017-05-06

## 2017-05-05 MED ORDER — BUPIVACAINE HCL (PF) 0.25 % IJ SOLN
INTRAMUSCULAR | Status: DC | PRN
  Administered 2017-05-05: 3 mL

## 2017-05-05 MED ORDER — MIDAZOLAM HCL 2 MG/2ML IJ SOLN
INTRAMUSCULAR | Status: AC
  Filled 2017-05-05: qty 2

## 2017-05-05 MED ORDER — INSULIN ASPART 100 UNIT/ML ~~LOC~~ SOLN: 0.0000 [IU] | Freq: Three times a day (TID) | SUBCUTANEOUS | Status: DC

## 2017-05-05 MED ORDER — FENTANYL CITRATE (PF) 100 MCG/2ML IJ SOLN
INTRAMUSCULAR | Status: DC | PRN
  Administered 2017-05-05 (×3): 25 ug via INTRAVENOUS

## 2017-05-05 MED ORDER — ROCURONIUM BROMIDE 50 MG/5ML IV SOLN
INTRAVENOUS | Status: AC
  Filled 2017-05-05: qty 1

## 2017-05-05 MED ORDER — ACETAMINOPHEN 325 MG PO TABS
650.0000 mg | ORAL_TABLET | Freq: Four times a day (QID) | ORAL | Status: DC | PRN
  Administered 2017-05-05: 650 mg via ORAL
  Filled 2017-05-05: qty 2

## 2017-05-05 MED ORDER — PYRIDOSTIGMINE BROMIDE 60 MG PO TABS
60.0000 mg | ORAL_TABLET | Freq: Three times a day (TID) | ORAL | Status: DC
  Administered 2017-05-05 – 2017-05-06 (×3): 60 mg via ORAL
  Filled 2017-05-05 (×7): qty 1

## 2017-05-05 MED ORDER — PROPOFOL 500 MG/50ML IV EMUL
INTRAVENOUS | Status: DC | PRN
  Administered 2017-05-05: 35 ug/kg/min via INTRAVENOUS

## 2017-05-05 MED ORDER — PIPERACILLIN-TAZOBACTAM 3.375 G IVPB
3.3750 g | Freq: Three times a day (TID) | INTRAVENOUS | Status: DC
  Administered 2017-05-05 – 2017-05-06 (×3): 3.375 g via INTRAVENOUS
  Filled 2017-05-05 (×3): qty 50

## 2017-05-05 MED ORDER — DEXTROSE 5 % IV SOLN
INTRAVENOUS | Status: DC | PRN
  Administered 2017-05-05: 10:00:00 via INTRAVENOUS

## 2017-05-05 MED ORDER — VANCOMYCIN HCL IN DEXTROSE 1-5 GM/200ML-% IV SOLN
1000.0000 mg | INTRAVENOUS | Status: AC
  Administered 2017-05-05: 1000 mg via INTRAVENOUS
  Filled 2017-05-05: qty 200

## 2017-05-05 MED ORDER — GLYCOPYRROLATE 0.2 MG/ML IJ SOLN
INTRAMUSCULAR | Status: AC
  Filled 2017-05-05: qty 3

## 2017-05-05 MED ORDER — LACTATED RINGERS IV SOLN
INTRAVENOUS | Status: DC
  Administered 2017-05-05 (×2): via INTRAVENOUS

## 2017-05-05 MED ORDER — ONDANSETRON HCL 4 MG/2ML IJ SOLN: 4.0000 mg | Freq: Four times a day (QID) | INTRAMUSCULAR | Status: DC | PRN

## 2017-05-05 MED ORDER — ONDANSETRON 4 MG PO TBDP
4.0000 mg | ORAL_TABLET | Freq: Four times a day (QID) | ORAL | Status: DC | PRN
  Administered 2017-05-06: 4 mg via ORAL
  Filled 2017-05-05: qty 1

## 2017-05-05 MED ORDER — LIDOCAINE HCL (PF) 1 % IJ SOLN
INTRAMUSCULAR | Status: AC
  Filled 2017-05-05: qty 2

## 2017-05-05 MED ORDER — NEOSTIGMINE METHYLSULFATE 10 MG/10ML IV SOLN
INTRAVENOUS | Status: AC
  Filled 2017-05-05: qty 1

## 2017-05-05 MED ORDER — VANCOMYCIN HCL IN DEXTROSE 1-5 GM/200ML-% IV SOLN
1000.0000 mg | Freq: Two times a day (BID) | INTRAVENOUS | Status: DC
  Administered 2017-05-05: 1000 mg via INTRAVENOUS
  Filled 2017-05-05: qty 200

## 2017-05-05 MED ORDER — HYDROMORPHONE HCL 1 MG/ML IJ SOLN
1.0000 mg | INTRAMUSCULAR | Status: DC | PRN
  Administered 2017-05-05: 1 mg via INTRAVENOUS
  Filled 2017-05-05: qty 1

## 2017-05-05 MED ORDER — HYDROCODONE-ACETAMINOPHEN 5-325 MG PO TABS
1.0000 | ORAL_TABLET | ORAL | Status: DC | PRN
  Administered 2017-05-05 – 2017-05-06 (×3): 2 via ORAL
  Filled 2017-05-05 (×3): qty 2

## 2017-05-05 MED ORDER — PRAVASTATIN SODIUM 40 MG PO TABS
40.0000 mg | ORAL_TABLET | Freq: Every day | ORAL | Status: DC
  Administered 2017-05-05: 40 mg via ORAL
  Filled 2017-05-05: qty 1

## 2017-05-05 MED ORDER — MIDAZOLAM HCL 5 MG/5ML IJ SOLN
INTRAMUSCULAR | Status: DC | PRN
  Administered 2017-05-05 (×3): 1 mg via INTRAVENOUS

## 2017-05-05 MED ORDER — CHLORHEXIDINE GLUCONATE CLOTH 2 % EX PADS: 6.0000 | MEDICATED_PAD | Freq: Once | CUTANEOUS | Status: DC

## 2017-05-05 MED ORDER — ACETAMINOPHEN 650 MG RE SUPP: 650.0000 mg | Freq: Four times a day (QID) | RECTAL | Status: DC | PRN

## 2017-05-05 MED ORDER — SODIUM CHLORIDE 0.9 % IR SOLN
Status: DC | PRN
  Administered 2017-05-05: 1000 mL

## 2017-05-05 MED ORDER — LACTATED RINGERS IV SOLN
INTRAVENOUS | Status: DC
  Administered 2017-05-05: 08:00:00 via INTRAVENOUS

## 2017-05-05 MED ORDER — LIDOCAINE HCL 1 % IJ SOLN
INTRAMUSCULAR | Status: DC | PRN
Start: 2017-05-05 — End: 2017-05-05
  Administered 2017-05-05: 3 mL

## 2017-05-05 MED ORDER — BUPIVACAINE HCL (PF) 0.25 % IJ SOLN
INTRAMUSCULAR | Status: AC
  Filled 2017-05-05: qty 30

## 2017-05-05 MED ORDER — MIDAZOLAM HCL 2 MG/2ML IJ SOLN
1.0000 mg | Freq: Once | INTRAMUSCULAR | Status: AC | PRN
  Administered 2017-05-05: 2 mg via INTRAVENOUS
  Filled 2017-05-05: qty 2

## 2017-05-05 MED ORDER — GLIMEPIRIDE 2 MG PO TABS
2.0000 mg | ORAL_TABLET | Freq: Every day | ORAL | Status: DC
  Administered 2017-05-06: 2 mg via ORAL
  Filled 2017-05-05: qty 1

## 2017-05-05 MED ORDER — CARVEDILOL 3.125 MG PO TABS
6.2500 mg | ORAL_TABLET | Freq: Two times a day (BID) | ORAL | Status: DC
  Administered 2017-05-05 – 2017-05-06 (×3): 6.25 mg via ORAL
  Filled 2017-05-05 (×4): qty 2

## 2017-05-05 MED ORDER — LIDOCAINE HCL (PF) 1 % IJ SOLN
INTRAMUSCULAR | Status: AC
  Filled 2017-05-05: qty 30

## 2017-05-05 SURGICAL SUPPLY — 32 items
BAG HAMPER (MISCELLANEOUS) ×2 IMPLANT
BANDAGE ELASTIC 4 VELCRO NS (GAUZE/BANDAGES/DRESSINGS) IMPLANT
BANDAGE ELASTIC 4 VELCRO ST LF (GAUZE/BANDAGES/DRESSINGS) ×2 IMPLANT
BNDG CONFORM 3 STRL LF (GAUZE/BANDAGES/DRESSINGS) ×2 IMPLANT
BNDG GAUZE ELAST 4 BULKY (GAUZE/BANDAGES/DRESSINGS) IMPLANT
CLOTH BEACON ORANGE TIMEOUT ST (SAFETY) ×2 IMPLANT
COVER LIGHT HANDLE STERIS (MISCELLANEOUS) ×4 IMPLANT
DECANTER SPIKE VIAL GLASS SM (MISCELLANEOUS) ×4 IMPLANT
DRSG XEROFORM 1X8 (GAUZE/BANDAGES/DRESSINGS) IMPLANT
ELECT REM PT RETURN 9FT ADLT (ELECTROSURGICAL) ×2
ELECTRODE REM PT RTRN 9FT ADLT (ELECTROSURGICAL) ×1 IMPLANT
FORMALIN 10 PREFIL 120ML (MISCELLANEOUS) ×2 IMPLANT
GAUZE SPONGE 4X4 12PLY STRL (GAUZE/BANDAGES/DRESSINGS) ×2 IMPLANT
GAUZE XEROFORM 1X8 LF (GAUZE/BANDAGES/DRESSINGS) ×2 IMPLANT
GLOVE BIOGEL PI IND STRL 7.0 (GLOVE) ×1 IMPLANT
GLOVE BIOGEL PI INDICATOR 7.0 (GLOVE) ×1
GLOVE SURG SS PI 7.5 STRL IVOR (GLOVE) ×2 IMPLANT
GOWN STRL REUS W/TWL LRG LVL3 (GOWN DISPOSABLE) ×6 IMPLANT
INST SET MINOR BONE (KITS) ×2 IMPLANT
KIT ROOM TURNOVER APOR (KITS) ×2 IMPLANT
MANIFOLD NEPTUNE II (INSTRUMENTS) ×2 IMPLANT
NS IRRIG 1000ML POUR BTL (IV SOLUTION) ×2 IMPLANT
PACK BASIC LIMB (CUSTOM PROCEDURE TRAY) ×2 IMPLANT
PAD ARMBOARD 7.5X6 YLW CONV (MISCELLANEOUS) ×2 IMPLANT
SET BASIN LINEN APH (SET/KITS/TRAYS/PACK) ×2 IMPLANT
SOL PREP PROV IODINE SCRUB 4OZ (MISCELLANEOUS) ×2 IMPLANT
SPONGE GAUZE 2X2 8PLY STRL LF (GAUZE/BANDAGES/DRESSINGS) ×2 IMPLANT
SPONGE LAP 18X18 X RAY DECT (DISPOSABLE) ×2 IMPLANT
SUT PROLENE 2 0 SH 30 (SUTURE) ×2 IMPLANT
SUT PROLENE 3 0 PS 2 (SUTURE) ×4 IMPLANT
SYR CONTROL 10ML LL (SYRINGE) ×2 IMPLANT
TUBE ANAEROBIC PORT A CUL  W/M (MISCELLANEOUS) IMPLANT

## 2017-05-05 NOTE — Anesthesia Postprocedure Evaluation (Signed)
Anesthesia Post Note  Patient: Natalie Contreras  Procedure(s) Performed: Procedure(s) (LRB): LEFT SECOND TOE AMPUTATION (Left)  Patient location during evaluation: PACU Anesthesia Type: MAC Level of consciousness: awake and patient cooperative Pain management: pain level controlled Vital Signs Assessment: post-procedure vital signs reviewed and stable Respiratory status: spontaneous breathing, nonlabored ventilation and respiratory function stable Cardiovascular status: blood pressure returned to baseline Postop Assessment: no signs of nausea or vomiting Anesthetic complications: no     Last Vitals:  Vitals:   05/05/17 0920 05/05/17 1055  BP:  (!) 180/91  Pulse:  83  Resp: (!) 28 20  Temp:  37.1 C  SpO2: 98% 100%    Last Pain:  Vitals:   05/05/17 1055  TempSrc:   PainSc: Asleep                 Yvette Roark J

## 2017-05-05 NOTE — Anesthesia Preprocedure Evaluation (Addendum)
Anesthesia Evaluation  Patient identified by MRN, date of birth, ID band Patient awake    Airway Mallampati: I  TM Distance: >3 FB Neck ROM: Full    Dental  (+) Poor Dentition   Pulmonary Current Smoker,  Moderate pulmonary htn peak pressures 49 mmHg   Pulmonary exam normal        Cardiovascular hypertension, +CHF (recent EF 55% - 60%)  + Valvular Problems/Murmurs AS  Rhythm:Regular Rate:Normal + Systolic murmurs    Neuro/Psych    GI/Hepatic   Endo/Other  diabetes, Well Controlled, Type 1  Renal/GU Renal InsufficiencyRenal disease     Musculoskeletal   Abdominal (+) + obese,   Peds  Hematology   Anesthesia Other Findings   Reproductive/Obstetrics                            Anesthesia Physical Anesthesia Plan  ASA: IV  Anesthesia Plan: MAC   Post-op Pain Management:    Induction: Intravenous  PONV Risk Score and Plan:   Airway Management Planned: Nasal Cannula and Natural Airway  Additional Equipment:   Intra-op Plan:   Post-operative Plan: Extubation in OR  Informed Consent:   Plan Discussed with: CRNA  Anesthesia Plan Comments:         Anesthesia Quick Evaluation

## 2017-05-05 NOTE — Evaluation (Signed)
Physical Therapy Evaluation Patient Details Name: Natalie Contreras MRN: [id number] DOB: May 03, 1966 Today's Date: 05/05/2017   History of Present Illness  Patient is a 51 year old white female who was recently hospitalized for cellulitis of the left foot with foreign body removal of the left foot are now presents with dry gangrene of the left second toe. This was not unexpected. She has finished her antibiotic course. She currently has no pain. She now presents for a left second toe amputation.  Clinical Impression  Pt received lying semirecumbent in bed and was agreeable to PT evaluation this date. Pt admitted with the above diagnosis and currently presents with the deficits detailed below (see "PT problem list"). She is from home with her dtr and pt was independent with all ADLs, IADLs, community ambulation with no AD, and is still driving when she needs to. Per therapy orders, pt is to be L heel WB. Currently pt required min A for bed mobility, and min guard to min A for transfers and ambulation with RW. Pt able to demonstrate proper WB on L heel throughout transfers and gait with RW. Pt reports multiple falls in the last 6 months due to her diabetic neuropathy. Pt currently functioning below her baseline level of function. Recommend HHPT once discharged to address gait and balance training as well as generalized weakness in order to decrease risk for falls. Pt agreeable to this plan.     Follow Up Recommendations Home health PT    Equipment Recommendations  None recommended by PT    Recommendations for Other Services       Precautions / Restrictions Precautions Precautions: Fall Restrictions Weight Bearing Restrictions: Yes LLE Weight Bearing:  (Heel WB on LLE)      Mobility  Bed Mobility Overal bed mobility: Needs Assistance Bed Mobility: Supine to Sit     Supine to sit: Min assist     General bed mobility comments: min A for upper body  Transfers Overall transfer level:  Needs assistance Equipment used: Rolling walker (2 wheeled) Transfers: Sit to/from Stand Sit to Stand: Min guard;Min assist         General transfer comment: cues for proper hand placement and cues to only put weight through L heel  Ambulation/Gait Ambulation/Gait assistance: Min guard;Min assist Ambulation Distance (Feet): 8 Feet Assistive device: Rolling walker (2 wheeled) Gait Pattern/deviations: Step-to pattern   Gait velocity interpretation: <1.8 ft/sec, indicative of risk for recurrent falls General Gait Details: amb in room with RW; cues to maintain weight through heel on L, pt able to demo understanding  Stairs            Wheelchair Mobility    Modified Rankin (Stroke Patients Only)       Balance Overall balance assessment: Needs assistance Sitting-balance support: No upper extremity supported Sitting balance-Leahy Scale: Good     Standing balance support: Bilateral upper extremity supported Standing balance-Leahy Scale: Fair                               Pertinent Vitals/Pain Pain Assessment: 0-10 Pain Score: 9  Pain Location: diabetic nerve pain in 3rd toe  Pain Descriptors / Indicators: Shooting Pain Intervention(s): Limited activity within patient's tolerance;Monitored during session;Repositioned    Home Living Family/patient expects to be discharged to:: Private residence Living Arrangements: Children (dtr) Available Help at Discharge: Family;Available 24 hours/day Type of Home: Apartment Home Access: Level entry     Home Layout: One  level Home Equipment: Grab bars - tub/shower;Shower seat;Walker - 2 wheels      Prior Function Level of Independence: Independent         Comments: independent with all ADLs, IADLs, community ambulation without AD, still driving     Hand Dominance   Dominant Hand: Right    Extremity/Trunk Assessment   Upper Extremity Assessment Upper Extremity Assessment: Overall WFL for tasks  assessed    Lower Extremity Assessment Lower Extremity Assessment: Generalized weakness    Cervical / Trunk Assessment Cervical / Trunk Assessment: Normal  Communication   Communication: No difficulties  Cognition Arousal/Alertness: Awake/alert Behavior During Therapy: WFL for tasks assessed/performed Overall Cognitive Status: Within Functional Limits for tasks assessed                                        General Comments      Exercises     Assessment/Plan    PT Assessment Patient needs continued PT services  PT Problem List Decreased strength;Decreased activity tolerance;Decreased balance;Decreased mobility;Decreased knowledge of use of DME;Impaired sensation       PT Treatment Interventions DME instruction;Gait training;Functional mobility training;Therapeutic activities;Therapeutic exercise;Balance training;Patient/family education    PT Goals (Current goals can be found in the Care Plan section)  Acute Rehab PT Goals Patient Stated Goal: to go home PT Goal Formulation: With patient Time For Goal Achievement: 05/10/17 Potential to Achieve Goals: Good    Frequency Min 3X/week   Barriers to discharge        Co-evaluation               AM-PAC PT "6 Clicks" Daily Activity  Outcome Measure Difficulty turning over in bed (including adjusting bedclothes, sheets and blankets)?: None Difficulty moving from lying on back to sitting on the side of the bed? : A Little Difficulty sitting down on and standing up from a chair with arms (e.g., wheelchair, bedside commode, etc,.)?: A Little Help needed moving to and from a bed to chair (including a wheelchair)?: A Little Help needed walking in hospital room?: A Little Help needed climbing 3-5 steps with a railing? : A Lot 6 Click Score: 18    End of Session Equipment Utilized During Treatment: Gait belt;Oxygen Activity Tolerance: Patient tolerated treatment well;No increased pain Patient left: in  chair;with call bell/phone within reach Nurse Communication: Mobility status PT Visit Diagnosis: Unsteadiness on feet (R26.81);History of falling (Z91.81);Other abnormalities of gait and mobility (R26.89);Difficulty in walking, not elsewhere classified (R26.2)    Time: 5456-2563 PT Time Calculation (min) (ACUTE ONLY): 26 min   Charges:   PT Evaluation $PT Eval Low Complexity: 1 Low PT Treatments $Therapeutic Activity: 8-22 mins   PT G Codes:   PT G-Codes **NOT FOR INPATIENT CLASS** Functional Assessment Tool Used: AM-PAC 6 Clicks Basic Mobility Functional Limitation: Mobility: Walking and moving around Mobility: Walking and Moving Around Current Status (S9373): At least 60 percent but less than 80 percent impaired, limited or restricted Mobility: Walking and Moving Around Goal Status [phone]): At least 20 percent but less than 40 percent impaired, limited or restricted     Geraldine Solar PT, DPT

## 2017-05-05 NOTE — Progress Notes (Signed)
Pharmacy Antibiotic Note  Natalie Contreras is a 51 y.o. female admitted on 05/05/2017 with cellulitis, s/p left second toe amputation.     Plan: Vancomycin 1000mg  IV q12hrs F/u cultures and clinical course     Temp (24hrs), Avg:99.6 F (37.6 C), Min:98.3 F (36.8 C), Max:101.9 F (38.8 C)   Recent Labs Lab 05/01/17 1130  WBC 7.5  CREATININE 1.23*    Estimated Creatinine Clearance: 62.3 mL/min (A) (by C-G formula based on SCr of 1.23 mg/dL (H)).    No Known Allergies  Antibiotics: Vancomycin 8/20 >>   Thank you for allowing pharmacy to be a part of this patient's care.  Hart Robinsons, PharmD Clinical Pharmacist Pager:  [phone] 05/05/2017

## 2017-05-05 NOTE — Transfer of Care (Signed)
Immediate Anesthesia Transfer of Care Note  Patient: Natalie Contreras  Procedure(s) Performed: Procedure(s): LEFT SECOND TOE AMPUTATION (Left)  Patient Location: PACU  Anesthesia Type:MAC  Level of Consciousness: awake and patient cooperative  Airway & Oxygen Therapy: Patient Spontanous Breathing and Patient connected to face mask oxygen  Post-op Assessment: Report given to RN, Post -op Vital signs reviewed and stable and Patient moving all extremities  Post vital signs: Reviewed and stable  Last Vitals:  Vitals:   05/05/17 0915 05/05/17 0920  BP: (!) 179/75   Pulse:    Resp: (!) 24 (!) 28  Temp:    SpO2: 99% 98%    Last Pain:  Vitals:   05/05/17 0813  TempSrc: Oral  PainSc: 2          Complications: No apparent anesthesia complications

## 2017-05-05 NOTE — Interval H&P Note (Signed)
History and Physical Interval Note:  05/05/2017 9:16 AM  Natalie Contreras  has presented today for surgery, with the diagnosis of gangrene of left 2nd toe  The various methods of treatment have been discussed with the patient and family. After consideration of risks, benefits and other options for treatment, the patient has consented to  Procedure(s): AMPUTATION 2ND TOE (Left) as a surgical intervention .  The patient's history has been reviewed, patient examined, no change in status, stable for surgery.  I have reviewed the patient's chart and labs.  Questions were answered to the patient's satisfaction.     Aviva Signs

## 2017-05-05 NOTE — Anesthesia Postprocedure Evaluation (Signed)
Anesthesia Post Note  Patient: Natalie Contreras  Procedure(s) Performed: Procedure(s) (LRB): LEFT SECOND TOE AMPUTATION (Left)  Patient location during evaluation: PACU Anesthesia Type: MAC Level of consciousness: awake and patient cooperative Pain management: pain level controlled Vital Signs Assessment: post-procedure vital signs reviewed and stable Respiratory status: spontaneous breathing, nonlabored ventilation, respiratory function stable and patient connected to nasal cannula oxygen Cardiovascular status: blood pressure returned to baseline Postop Assessment: no signs of nausea or vomiting Anesthetic complications: no     Last Vitals:  Vitals:   05/05/17 0915 05/05/17 0920  BP: (!) 179/75   Pulse:    Resp: (!) 24 (!) 28  Temp:    SpO2: 99% 98%    Last Pain:  Vitals:   05/05/17 0813  TempSrc: Oral  PainSc: 2                  Awa Bachicha J

## 2017-05-05 NOTE — Op Note (Signed)
Patient:  Natalie Contreras  DOB:  [birth year]  MRN:  [id number]   Preop Diagnosis:  Dry gangrene, left second toe, diabetes mellitus  Postop Diagnosis:  Same  Procedure:  Left second toe amputation  Surgeon:  Aviva Signs, M.D.  Anes:  Mac  Indications:  Patient is a 51 year old white female with uncontrolled diabetes mellitus and peripheral vascular disease who now presents with dry gangrene of the left second toe. The risks and benefits of the procedure were fully explained to the patient, who gave informed consent.  Procedure note:  The patient was placed in supine position. After monitored anesthesia care was given, the left foot was prepped and draped using usual sterile technique with Betadine. Surgical site confirmation was performed. One percent Xylocaine mixed with 1/4% bupivacaine was injected in and around the left second toe for a block.  An elliptical incision was made around the base the left second toe. This was taken down to the transmetatarsal head. Necrotic tissue was found. This was debrided sharply without difficulty. The toe was then amputated using a transmetatarsal approach. It was removed from the operative field. The wound was irrigated with normal saline. Xeroform was placed along the base of the amputation site. The skin was reapproximated using 2-0 and 3-0 Prolene interrupted sutures. A dry sterile dressing and Ace wrap were applied.  All tape and needle counts were correct at the end the procedure. The patient was transferred to PACU in stable condition.  Complications:  None  EBL:  Minimal  Specimen:  Left second toe

## 2017-05-06 DIAGNOSIS — E1152 Type 2 diabetes mellitus with diabetic peripheral angiopathy with gangrene: Secondary | ICD-10-CM | POA: Diagnosis not present

## 2017-05-06 LAB — GLUCOSE, CAPILLARY: Glucose-Capillary: 95 mg/dL (ref 65–99)

## 2017-05-06 LAB — BASIC METABOLIC PANEL
Anion gap: 7 (ref 5–15)
BUN: 23 mg/dL — AB (ref 6–20)
CALCIUM: 8.5 mg/dL — AB (ref 8.9–10.3)
CHLORIDE: 100 mmol/L — AB (ref 101–111)
CO2: 29 mmol/L (ref 22–32)
CREATININE: 1.26 mg/dL — AB (ref 0.44–1.00)
GFR calc non Af Amer: 49 mL/min — ABNORMAL LOW (ref >60)
GFR, EST AFRICAN AMERICAN: 57 mL/min — AB (ref >60)
Glucose, Bld: 104 mg/dL — ABNORMAL HIGH (ref 65–99)
Potassium: 3.8 mmol/L (ref 3.5–5.1)
SODIUM: 136 mmol/L (ref 135–145)

## 2017-05-06 LAB — CBC
HEMATOCRIT: 25.2 % — AB (ref 36.0–46.0)
HEMOGLOBIN: 8.3 g/dL — AB (ref 12.0–15.0)
MCH: 29 pg (ref 26.0–34.0)
MCHC: 32.9 g/dL (ref 30.0–36.0)
MCV: 88.1 fL (ref 78.0–100.0)
Platelets: 229 10*3/uL (ref 150–400)
RBC: 2.86 MIL/uL — AB (ref 3.87–5.11)
RDW: 14.8 % (ref 11.5–15.5)
WBC: 7.6 10*3/uL (ref 4.0–10.5)

## 2017-05-06 MED ORDER — SULFAMETHOXAZOLE-TRIMETHOPRIM 800-160 MG PO TABS: 1.0000 | ORAL_TABLET | Freq: Two times a day (BID) | ORAL | 0 refills | Status: DC

## 2017-05-06 NOTE — Care Management Note (Signed)
Case Management Note  Patient Details  Name: Natalie Contreras MRN: [id number] Date of Birth: 07/21/1966  Subjective/Objective:                  S/p toe amputation. Pt from home with family, ind with ADL's. HH PT recommended pt agreeable, has no preference of [place] provider. Aware HH has 48 hrs to make first visit. No DME needs. No further DC needs communicated.  Action/Plan: DC home today with [place], [facility] rep, aware of referral and will obtain pt info from chart.   Expected Discharge Date:  05/06/17               Expected Discharge Plan:  [place]  In-House Referral:  NA  Discharge planning Services  CM Consult  Post Acute Care Choice:  Home Health Choice offered to:  Patient  HH Arranged:  PT [place]:  [place]  Status of Service:  Completed, signed off  Sherald Barge, RN 05/06/2017, 11:05 AM

## 2017-05-06 NOTE — Anesthesia Postprocedure Evaluation (Signed)
Anesthesia Post Note  Patient: Natalie Contreras  Procedure(s) Performed: Procedure(s) (LRB): LEFT SECOND TOE AMPUTATION (Left)  Patient location during evaluation: Nursing Unit Anesthesia Type: MAC Level of consciousness: awake and alert, oriented and patient cooperative Pain management: pain level controlled Vital Signs Assessment: post-procedure vital signs reviewed and stable Respiratory status: spontaneous breathing Cardiovascular status: stable Postop Assessment: no signs of nausea or vomiting Anesthetic complications: no     Last Vitals:  Vitals:   05/05/17 2025 05/06/17 0541  BP: (!) 158/72 (!) 122/53  Pulse: 90 (!) 55  Resp: 20 15  Temp: (!) 38.1 C 37.4 C  SpO2: 96% 98%    Last Pain:  Vitals:   05/06/17 0643  TempSrc:   PainSc: Asleep                 Marshaun Lortie A

## 2017-05-06 NOTE — Addendum Note (Signed)
Addendum  created 05/06/17 0829 by Mikey College, MD   Anesthesia Attestations filed

## 2017-05-06 NOTE — Progress Notes (Signed)
Pt discharged home today per Dr. Jenkins. Pt's IV site D/C'd and WDL. Pt's VSS. Pt provided with home medication list, discharge instructions and prescriptions. Verbalized understanding. Pt left floor via WC in stable condition accompanied by NT. 

## 2017-05-06 NOTE — Addendum Note (Signed)
Addendum  created 05/06/17 0804 by Mickel Baas, CRNA   Sign clinical note

## 2017-05-06 NOTE — Discharge Summary (Signed)
Physician Discharge Summary  Patient ID: Natalie Contreras MRN: [id number] DOB/AGE: November 03, 1965 51 y.o.  Admit date: 05/05/2017 Discharge date: 05/06/2017  Admission Diagnoses:Dry gangrene of left second toe, diabetes mellitus  Discharge Diagnoses: Same Active Problems:   Toe amputation status, left (HCC)   Gangrene of toe of left foot [facility])   Discharged Condition: good  Hospital Course: Patient is a 51 year old white female with uncontrolled diabetes mellitus who was recently discharged from the hospital with cellulitis of left foot. She was also noted to have mild peripheral vascular disease which was not in need of surgical revascularization. She developed dry gangrene of the left second toe. She underwent a left second toe amputation on 05/05/2017. She tolerated the procedure well. Her postoperative course has been unremarkable. Her diet was advanced without difficulty. The patient is being discharged home on 05/06/2017 in good and improving condition.  Treatments: surgery: Toe amputation, left second toe on 05/05/2017  Discharge Exam: Blood pressure (!) 122/53, pulse (!) 55, temperature 99.3 F (37.4 C), temperature source Oral, resp. rate 15, height 5\' 4"  (1.626 m), weight 196 lb (88.9 kg), last menstrual period 12/16/2015, SpO2 98 %. General appearance: alert, cooperative and no distress Resp: clear to auscultation bilaterally Cardio: regular rate and rhythm, S1, S2 normal, no murmur, click, rub or gallop Extremities: Left foot dressing dry and intact.  Disposition: 01-Home or Self Care  Discharge Instructions    Diet - low sodium heart healthy    Complete by:  As directed    Increase activity slowly    Complete by:  As directed      Allergies as of 05/06/2017   No Known Allergies     Medication List    STOP taking these medications   amoxicillin-clavulanate 875-125 MG tablet Commonly known as:  AUGMENTIN     TAKE these medications   aspirin EC 81 MG tablet Take  81 mg by mouth daily.   carvedilol 6.25 MG tablet Commonly known as:  COREG Take 6.25 mg by mouth 2 (two) times daily.   glimepiride 2 MG tablet Commonly known as:  AMARYL Take 2 mg by mouth daily with breakfast.   hydrALAZINE 50 MG tablet Commonly known as:  APRESOLINE Take 1 tablet (50 mg total) by mouth 3 (three) times daily.   lovastatin 40 MG tablet Commonly known as:  MEVACOR Take 40 mg by mouth at bedtime.   pyridostigmine 60 MG tablet Commonly known as:  MESTINON Take 60 mg by mouth every 8 (eight) hours.   sulfamethoxazole-trimethoprim 800-160 MG tablet Commonly known as:  BACTRIM DS,SEPTRA DS Take 1 tablet by mouth 2 (two) times daily.   torsemide 20 MG tablet Commonly known as:  DEMADEX Take 40 mg in the AM and 20 mg in the PM      Follow-up Information    Aviva Signs, MD. Schedule an appointment as soon as possible for a visit on 05/08/2017.   Specialty:  General Surgery Contact information: [id number] [place] [address] [phone]           Signed: Aviva Signs 05/06/2017, 8:29 AM

## 2017-05-07 ENCOUNTER — Encounter (HOSPITAL_COMMUNITY): Payer: Self-pay | Admitting: General Surgery

## 2017-05-08 ENCOUNTER — Ambulatory Visit (INDEPENDENT_AMBULATORY_CARE_PROVIDER_SITE_OTHER): Payer: Self-pay | Admitting: General Surgery

## 2017-05-08 ENCOUNTER — Encounter: Payer: Self-pay | Admitting: General Surgery

## 2017-05-08 VITALS — BP 141/59 | HR 75 | Temp 97.2°F | Resp 18 | Ht 64.0 in | Wt 211.0 lb

## 2017-05-08 DIAGNOSIS — Z09 Encounter for follow-up examination after completed treatment for conditions other than malignant neoplasm: Secondary | ICD-10-CM

## 2017-05-08 NOTE — Progress Notes (Signed)
Subjective:     Natalie Contreras  Status post left second toe amputation. Patient has no complaints. Objective:    BP (!) 141/59   Pulse 75   Temp (!) 97.2 F (36.2 C)   Resp 18   Ht 5\' 4"  (1.626 m)   Wt 211 lb (95.7 kg)   LMP 12/16/2015   BMI 36.22 kg/m   General:  alert, cooperative and no distress  Left second toe amputation site with partial dehiscence of wound. This was not unexpected. Xeroform removed. Cleaned and redressed.     Assessment:    Doing well postoperatively.    Plan:   Wash wound with soap and water daily. Follow-up here in 2 weeks.

## 2017-05-22 ENCOUNTER — Ambulatory Visit (INDEPENDENT_AMBULATORY_CARE_PROVIDER_SITE_OTHER): Payer: Self-pay | Admitting: General Surgery

## 2017-05-22 VITALS — BP 135/61 | HR 67 | Temp 97.8°F | Resp 18 | Ht 64.0 in | Wt 209.0 lb

## 2017-05-22 DIAGNOSIS — Z09 Encounter for follow-up examination after completed treatment for conditions other than malignant neoplasm: Secondary | ICD-10-CM

## 2017-05-22 NOTE — Progress Notes (Signed)
Subjective:     Natalie Contreras  Status post left second toe amputation. Patient denies any fever or pain. Objective:    BP 135/61   Pulse 67   Temp 97.8 F (36.6 C)   Resp 18   Ht 5\' 4"  (1.626 m)   Wt 209 lb (94.8 kg)   LMP 12/16/2015   BMI 35.87 kg/m   General:  alert, cooperative and no distress  Left foot amputation site healing well by secondary intention. Loose sutures removed. No purulent drainage noted. Significantly less swelling and erythema in left foot.     Assessment:    Doing well postoperatively.    Plan:   Wash wound daily with soap and water. May apply triple antibiotic ointment daily. Follow-up urine 2 weeks.

## 2017-06-05 ENCOUNTER — Ambulatory Visit (INDEPENDENT_AMBULATORY_CARE_PROVIDER_SITE_OTHER): Payer: Self-pay | Admitting: General Surgery

## 2017-06-05 ENCOUNTER — Encounter: Payer: Self-pay | Admitting: General Surgery

## 2017-06-05 VITALS — BP 150/57 | HR 70 | Temp 97.7°F | Resp 18 | Ht 64.0 in | Wt 208.0 lb

## 2017-06-05 DIAGNOSIS — Z09 Encounter for follow-up examination after completed treatment for conditions other than malignant neoplasm: Secondary | ICD-10-CM

## 2017-06-05 MED ORDER — SULFAMETHOXAZOLE-TRIMETHOPRIM 400-80 MG PO TABS: 1.0000 | ORAL_TABLET | Freq: Two times a day (BID) | ORAL | 0 refills | Status: DC

## 2017-06-05 NOTE — Progress Notes (Signed)
Subjective:     Natalie Contreras  Status post left second toe amputation. I have been seeing her for follow-up wound care due to the wound healing by secondary intention. She has developed necrosis of the tip of her left fourth toe and an ulceration at the base of the left fifth toe since I last saw her. She denies any fevers. Objective:    BP (!) 150/57   Pulse 70   Temp 97.7 F (36.5 C)   Resp 18   Ht 5\' 4"  (1.626 m)   Wt 208 lb (94.3 kg)   LMP 12/16/2015   BMI 35.70 kg/m   General:  alert, cooperative and no distress  Left foot with poorly healing left second toe amputation site. Dry gangrene is noted at the tip of the left fourth toe. A small skin ulceration is noted laterally at the base of the left fifth toe. The great toe is not involved.     Assessment:    Diabetes mellitus with peripheral vascular disease. I am concerned that she has had progression of her disease and may need a transmetatarsal amputation of the left foot, but saving the left great toe.    Plan:   Bactrim DS 1 tablet by mouth twice a day 2 weeks. I will see her in one week for follow-up. She is aware that she may require further amputation.

## 2017-06-12 ENCOUNTER — Ambulatory Visit (INDEPENDENT_AMBULATORY_CARE_PROVIDER_SITE_OTHER): Payer: Self-pay | Admitting: General Surgery

## 2017-06-12 ENCOUNTER — Encounter: Payer: Self-pay | Admitting: General Surgery

## 2017-06-12 VITALS — BP 124/54 | HR 70 | Temp 97.8°F | Resp 18 | Ht 64.0 in | Wt 212.0 lb

## 2017-06-12 DIAGNOSIS — Z09 Encounter for follow-up examination after completed treatment for conditions other than malignant neoplasm: Secondary | ICD-10-CM

## 2017-06-12 NOTE — Progress Notes (Signed)
Subjective:     Natalie Contreras  Status post left second toe amputation with wound healing by secondary intention. It was on antibiotics. Patient states her glucoses are well controlled. She denies any fevers. Objective:    BP (!) 124/54   Pulse 70   Temp 97.8 F (36.6 C)   Resp 18   Ht 5\' 4"  (1.626 m)   Wt 212 lb (96.2 kg)   LMP 12/16/2015   BMI 36.39 kg/m   General:  alert, cooperative and no distress  Left foot only slightly edematous. Minimal erythema noted. Surgical wound healing slowly by secondary intention. No purulent drainage noted. Tip of left fourth toe with dry gangrene present. Small superficial ulceration noted along the medial aspect of left fifth toe. Previous incision and drainage site along the sole of the left foot healed.     Assessment:    Diabetes mellitus with peripheral vascular disease, status post left second toe amputation. Healing slowly by secondary intention. Dry gangrene of left fourth toe.    Plan:   Finish antibiotic course. Continue cleaning wound daily. Follow-up here in 3 weeks.

## 2017-06-27 ENCOUNTER — Other Ambulatory Visit: Payer: Self-pay | Admitting: Cardiology

## 2017-07-03 ENCOUNTER — Encounter: Payer: Self-pay | Admitting: General Surgery

## 2017-07-03 ENCOUNTER — Ambulatory Visit (INDEPENDENT_AMBULATORY_CARE_PROVIDER_SITE_OTHER): Payer: Self-pay | Admitting: General Surgery

## 2017-07-03 VITALS — BP 134/53 | HR 78 | Temp 97.8°F | Resp 18 | Ht 64.0 in | Wt 221.0 lb

## 2017-07-03 DIAGNOSIS — Z09 Encounter for follow-up examination after completed treatment for conditions other than malignant neoplasm: Secondary | ICD-10-CM

## 2017-07-03 NOTE — Progress Notes (Signed)
Subjective:     Natalie Contreras  Here for follow-up of left second toe". Patient has bilateral leg edema extending up to the knees. She denies any fevers. She has had no purulent drainage from the open amputation site wound. Objective:    BP (!) 134/53   Pulse 78   Temp 97.8 F (36.6 C)   Resp 18   Ht 5\' 4"  (1.626 m)   Wt 221 lb (100.2 kg)   LMP 12/16/2015   BMI 37.93 kg/m   General:  alert, cooperative and no distress  Left foot is with pitting edema, the amputation site is without purulent drainage. Soft eschar is present. The left fourth toe has dry gangrene on its tip. No worsening cellulitis is noted. The left foot was redressed.     Assessment:    Left foot cellulitis secondary to peripheral vascular disease, diabetes mellitus    Plan:   The left foot actually is improved since I last saw her. No need for further amputation at this time. Continue current wound care. Follow. One month.

## 2017-07-17 ENCOUNTER — Ambulatory Visit (INDEPENDENT_AMBULATORY_CARE_PROVIDER_SITE_OTHER): Payer: Self-pay | Admitting: General Surgery

## 2017-07-17 ENCOUNTER — Inpatient Hospital Stay (HOSPITAL_COMMUNITY)
Admission: EM | Admit: 2017-07-17 | Discharge: 2017-07-23 | DRG: 857 | Disposition: A | Discharge status: Home Health | Payer: BLUE CROSS/BLUE SHIELD | Attending: General Surgery | Admitting: General Surgery

## 2017-07-17 ENCOUNTER — Encounter: Payer: Self-pay | Admitting: General Surgery

## 2017-07-17 ENCOUNTER — Emergency Department (HOSPITAL_COMMUNITY): Payer: BLUE CROSS/BLUE SHIELD

## 2017-07-17 ENCOUNTER — Encounter (HOSPITAL_COMMUNITY): Payer: Self-pay | Admitting: Emergency Medicine

## 2017-07-17 VITALS — BP 191/78 | HR 71 | Temp 96.6°F | Resp 18 | Ht 64.0 in | Wt 209.0 lb

## 2017-07-17 DIAGNOSIS — E1169 Type 2 diabetes mellitus with other specified complication: Secondary | ICD-10-CM | POA: Diagnosis present

## 2017-07-17 DIAGNOSIS — D509 Iron deficiency anemia, unspecified: Secondary | ICD-10-CM | POA: Diagnosis present

## 2017-07-17 DIAGNOSIS — L03119 Cellulitis of unspecified part of limb: Secondary | ICD-10-CM

## 2017-07-17 DIAGNOSIS — I739 Peripheral vascular disease, unspecified: Secondary | ICD-10-CM | POA: Diagnosis present

## 2017-07-17 DIAGNOSIS — Z79899 Other long term (current) drug therapy: Secondary | ICD-10-CM

## 2017-07-17 DIAGNOSIS — I96 Gangrene, not elsewhere classified: Secondary | ICD-10-CM | POA: Diagnosis present

## 2017-07-17 DIAGNOSIS — Y835 Amputation of limb(s) as the cause of abnormal reaction of the patient, or of later complication, without mention of misadventure at the time of the procedure: Secondary | ICD-10-CM | POA: Diagnosis present

## 2017-07-17 DIAGNOSIS — N179 Acute kidney failure, unspecified: Secondary | ICD-10-CM | POA: Diagnosis not present

## 2017-07-17 DIAGNOSIS — Z7982 Long term (current) use of aspirin: Secondary | ICD-10-CM

## 2017-07-17 DIAGNOSIS — I5033 Acute on chronic diastolic (congestive) heart failure: Secondary | ICD-10-CM | POA: Diagnosis present

## 2017-07-17 DIAGNOSIS — M86172 Other acute osteomyelitis, left ankle and foot: Secondary | ICD-10-CM | POA: Diagnosis present

## 2017-07-17 DIAGNOSIS — L0390 Cellulitis, unspecified: Secondary | ICD-10-CM | POA: Insufficient documentation

## 2017-07-17 DIAGNOSIS — Z23 Encounter for immunization: Secondary | ICD-10-CM | POA: Diagnosis not present

## 2017-07-17 DIAGNOSIS — E0840 Diabetes mellitus due to underlying condition with diabetic neuropathy, unspecified: Secondary | ICD-10-CM

## 2017-07-17 DIAGNOSIS — Z961 Presence of intraocular lens: Secondary | ICD-10-CM | POA: Diagnosis present

## 2017-07-17 DIAGNOSIS — F17210 Nicotine dependence, cigarettes, uncomplicated: Secondary | ICD-10-CM | POA: Diagnosis present

## 2017-07-17 DIAGNOSIS — D638 Anemia in other chronic diseases classified elsewhere: Secondary | ICD-10-CM | POA: Diagnosis present

## 2017-07-17 DIAGNOSIS — Z9841 Cataract extraction status, right eye: Secondary | ICD-10-CM | POA: Diagnosis not present

## 2017-07-17 DIAGNOSIS — I272 Pulmonary hypertension, unspecified: Secondary | ICD-10-CM | POA: Diagnosis present

## 2017-07-17 DIAGNOSIS — L03116 Cellulitis of left lower limb: Secondary | ICD-10-CM | POA: Diagnosis present

## 2017-07-17 DIAGNOSIS — IMO0002 Reserved for concepts with insufficient information to code with codable children: Secondary | ICD-10-CM

## 2017-07-17 DIAGNOSIS — E860 Dehydration: Secondary | ICD-10-CM | POA: Diagnosis not present

## 2017-07-17 DIAGNOSIS — Z7984 Long term (current) use of oral hypoglycemic drugs: Secondary | ICD-10-CM | POA: Diagnosis not present

## 2017-07-17 DIAGNOSIS — E1152 Type 2 diabetes mellitus with diabetic peripheral angiopathy with gangrene: Secondary | ICD-10-CM | POA: Diagnosis present

## 2017-07-17 DIAGNOSIS — I10 Essential (primary) hypertension: Secondary | ICD-10-CM | POA: Diagnosis not present

## 2017-07-17 DIAGNOSIS — E1140 Type 2 diabetes mellitus with diabetic neuropathy, unspecified: Secondary | ICD-10-CM | POA: Diagnosis present

## 2017-07-17 DIAGNOSIS — L02619 Cutaneous abscess of unspecified foot: Secondary | ICD-10-CM | POA: Diagnosis not present

## 2017-07-17 DIAGNOSIS — E11628 Type 2 diabetes mellitus with other skin complications: Secondary | ICD-10-CM | POA: Diagnosis present

## 2017-07-17 DIAGNOSIS — Z9842 Cataract extraction status, left eye: Secondary | ICD-10-CM | POA: Diagnosis not present

## 2017-07-17 DIAGNOSIS — M869 Osteomyelitis, unspecified: Secondary | ICD-10-CM | POA: Insufficient documentation

## 2017-07-17 DIAGNOSIS — I110 Hypertensive heart disease with heart failure: Secondary | ICD-10-CM | POA: Diagnosis present

## 2017-07-17 DIAGNOSIS — R531 Weakness: Secondary | ICD-10-CM | POA: Diagnosis present

## 2017-07-17 DIAGNOSIS — Z89422 Acquired absence of other left toe(s): Secondary | ICD-10-CM | POA: Diagnosis not present

## 2017-07-17 DIAGNOSIS — I5032 Chronic diastolic (congestive) heart failure: Secondary | ICD-10-CM | POA: Diagnosis present

## 2017-07-17 DIAGNOSIS — T8149XA Infection following a procedure, other surgical site, initial encounter: Principal | ICD-10-CM | POA: Diagnosis present

## 2017-07-17 DIAGNOSIS — E1165 Type 2 diabetes mellitus with hyperglycemia: Secondary | ICD-10-CM | POA: Diagnosis present

## 2017-07-17 DIAGNOSIS — M86271 Subacute osteomyelitis, right ankle and foot: Secondary | ICD-10-CM | POA: Insufficient documentation

## 2017-07-17 DIAGNOSIS — Z09 Encounter for follow-up examination after completed treatment for conditions other than malignant neoplasm: Secondary | ICD-10-CM

## 2017-07-17 LAB — CBC WITH DIFFERENTIAL/PLATELET
BASOS ABS: 0 10*3/uL (ref 0.0–0.1)
Basophils Relative: 0 %
EOS ABS: 0.1 10*3/uL (ref 0.0–0.7)
EOS PCT: 1 %
HCT: 28.6 % — ABNORMAL LOW (ref 36.0–46.0)
Hemoglobin: 8.9 g/dL — ABNORMAL LOW (ref 12.0–15.0)
Lymphocytes Relative: 10 %
Lymphs Abs: 1.2 10*3/uL (ref 0.7–4.0)
MCH: 24.5 pg — ABNORMAL LOW (ref 26.0–34.0)
MCHC: 31.1 g/dL (ref 30.0–36.0)
MCV: 78.6 fL (ref 78.0–100.0)
Monocytes Absolute: 0.6 10*3/uL (ref 0.1–1.0)
Monocytes Relative: 5 %
NEUTROS PCT: 84 %
Neutro Abs: 9.4 10*3/uL — ABNORMAL HIGH (ref 1.7–7.7)
PLATELETS: 426 10*3/uL — AB (ref 150–400)
RBC: 3.64 MIL/uL — AB (ref 3.87–5.11)
RDW: 16.2 % — ABNORMAL HIGH (ref 11.5–15.5)
WBC: 11.3 10*3/uL — AB (ref 4.0–10.5)

## 2017-07-17 LAB — COMPREHENSIVE METABOLIC PANEL
ALT: 42 U/L (ref 14–54)
AST: 36 U/L (ref 15–41)
Albumin: 2.7 g/dL — ABNORMAL LOW (ref 3.5–5.0)
Alkaline Phosphatase: 313 U/L — ABNORMAL HIGH (ref 38–126)
Anion gap: 10 (ref 5–15)
BILIRUBIN TOTAL: 0.7 mg/dL (ref 0.3–1.2)
BUN: 35 mg/dL — AB (ref 6–20)
CO2: 24 mmol/L (ref 22–32)
CREATININE: 1.14 mg/dL — AB (ref 0.44–1.00)
Calcium: 8.8 mg/dL — ABNORMAL LOW (ref 8.9–10.3)
Chloride: 99 mmol/L — ABNORMAL LOW (ref 101–111)
GFR, EST NON AFRICAN AMERICAN: 56 mL/min — AB (ref >60)
Glucose, Bld: 310 mg/dL — ABNORMAL HIGH (ref 65–99)
POTASSIUM: 4.4 mmol/L (ref 3.5–5.1)
Sodium: 133 mmol/L — ABNORMAL LOW (ref 135–145)
TOTAL PROTEIN: 7.2 g/dL (ref 6.5–8.1)

## 2017-07-17 LAB — GLUCOSE, CAPILLARY
GLUCOSE-CAPILLARY: 322 mg/dL — AB (ref 65–99)
Glucose-Capillary: 201 mg/dL — ABNORMAL HIGH (ref 65–99)

## 2017-07-17 LAB — CBG MONITORING, ED: GLUCOSE-CAPILLARY: 288 mg/dL — AB (ref 65–99)

## 2017-07-17 IMAGING — DX DG FOOT COMPLETE 3+V*L*
3 series · 3 of 3 positions shown · non-contrast
Comparison: [DATE]

CLINICAL DATA: Left foot pain, swelling and redness for 3 days.

EXAM:
LEFT FOOT - COMPLETE 3+ VIEW

[foot ap]
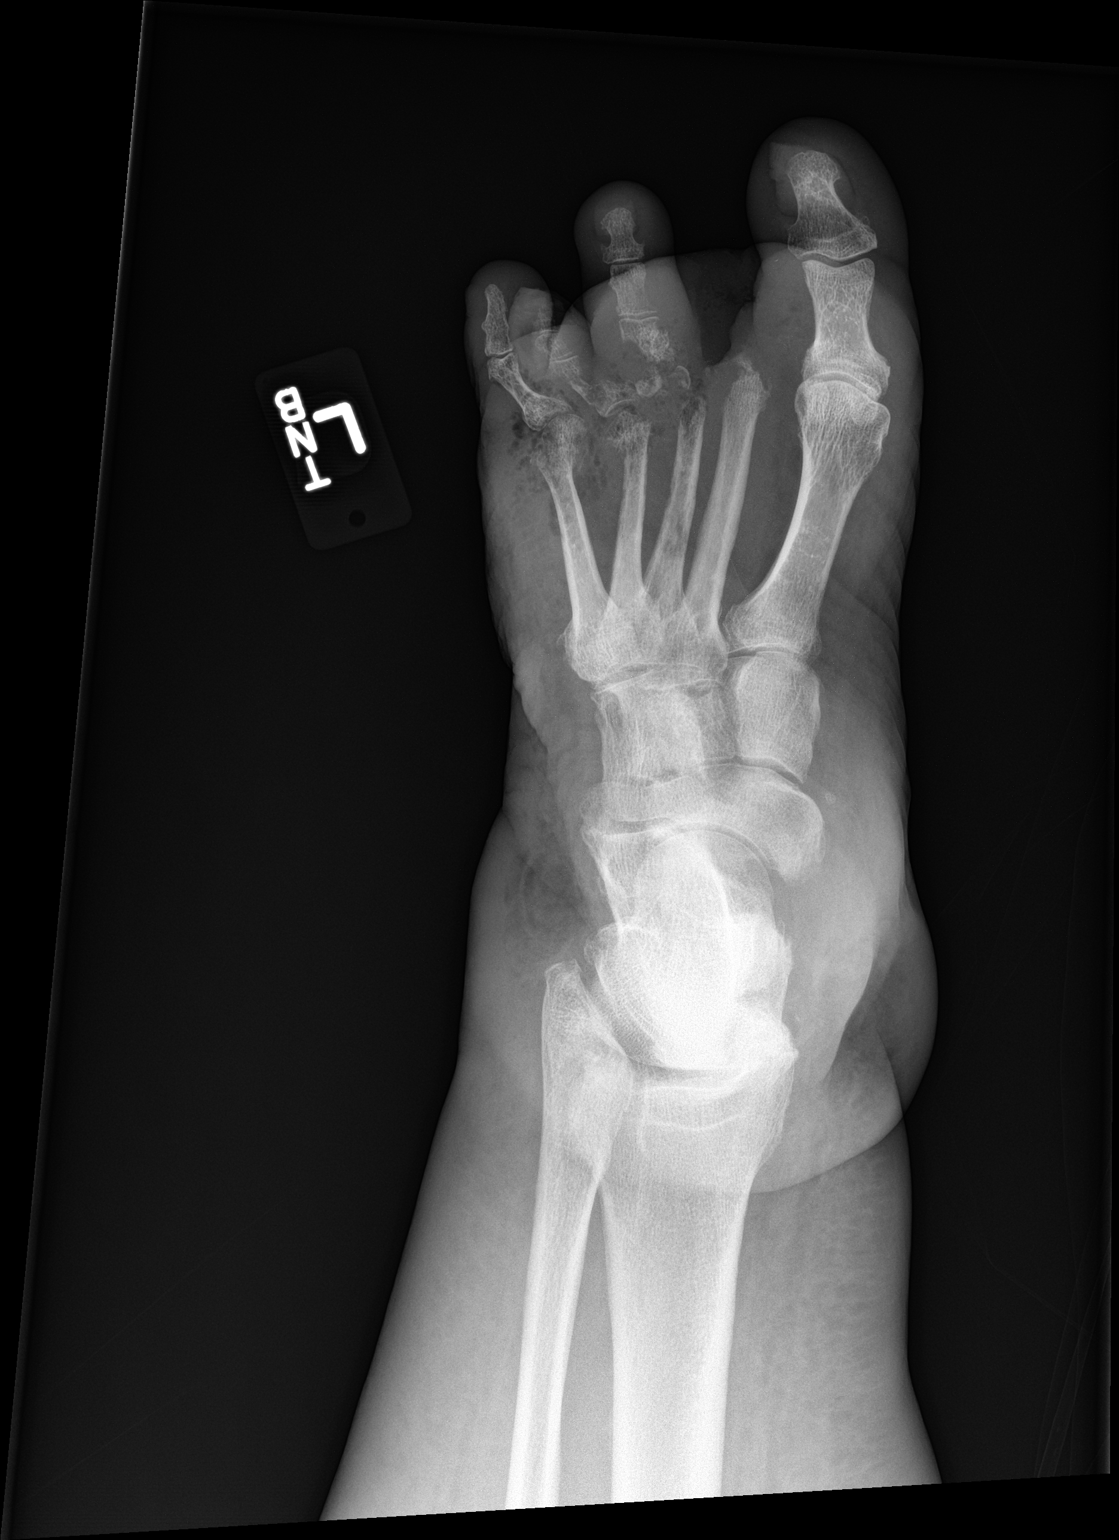

[foot obl]
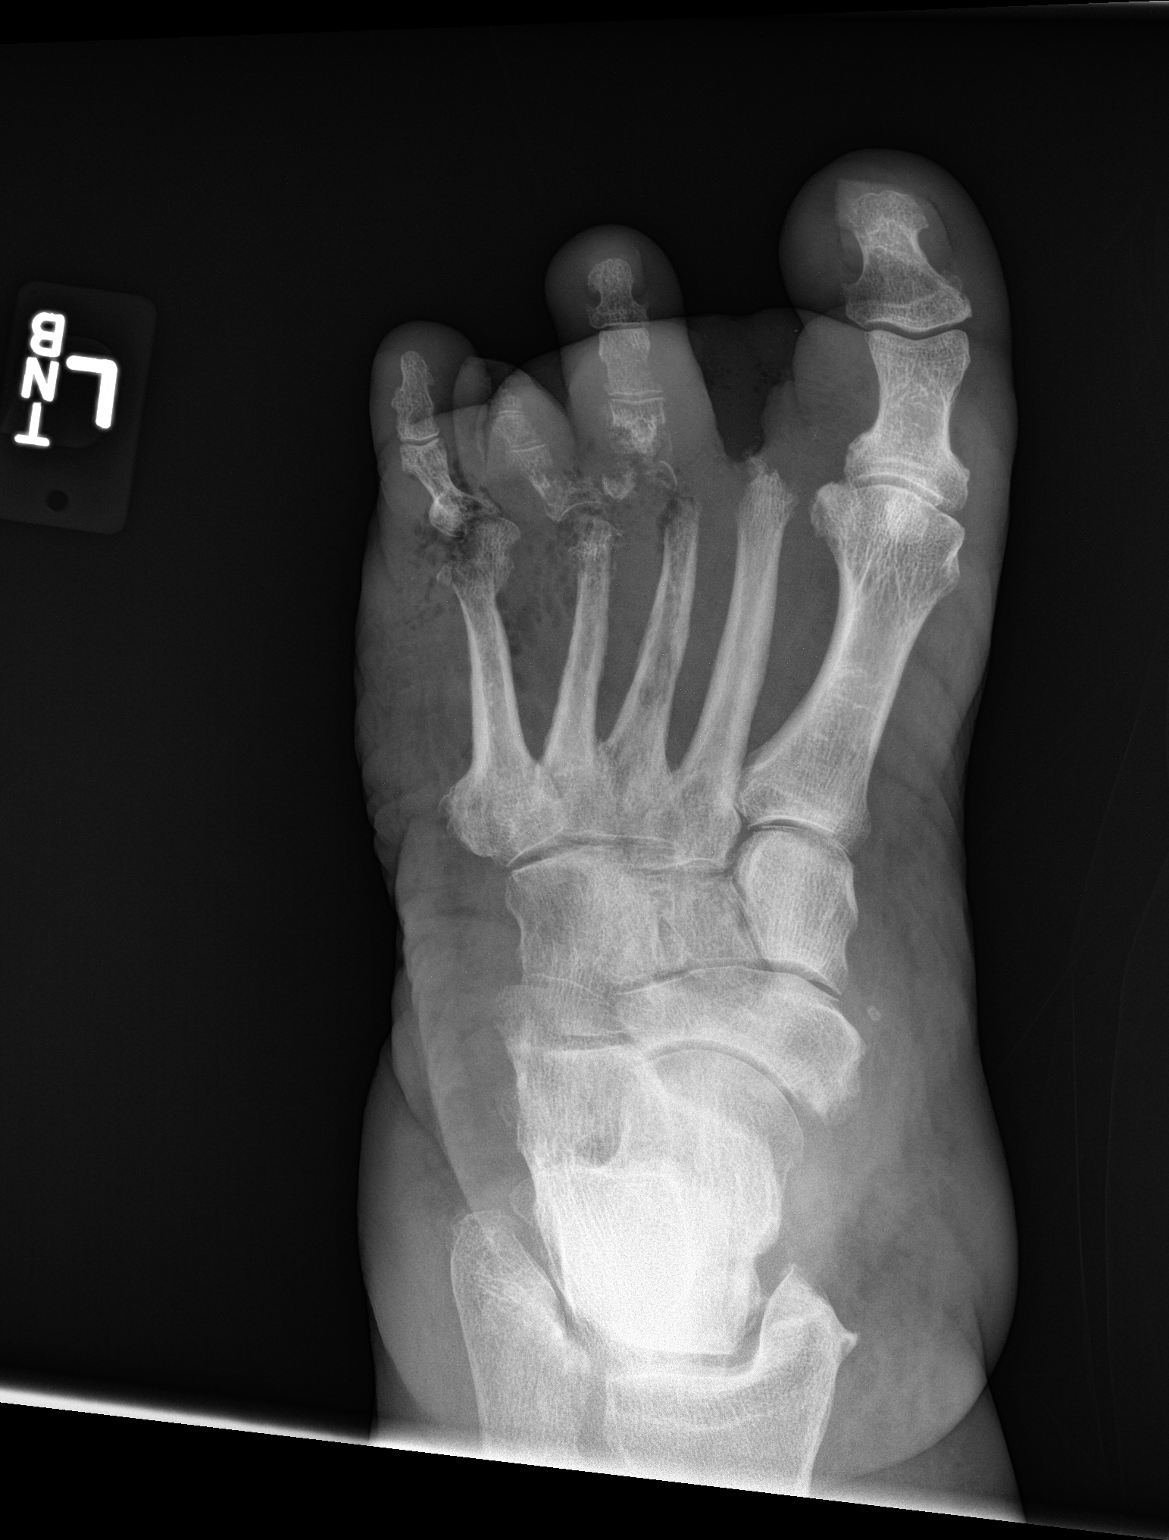

[foot lat]
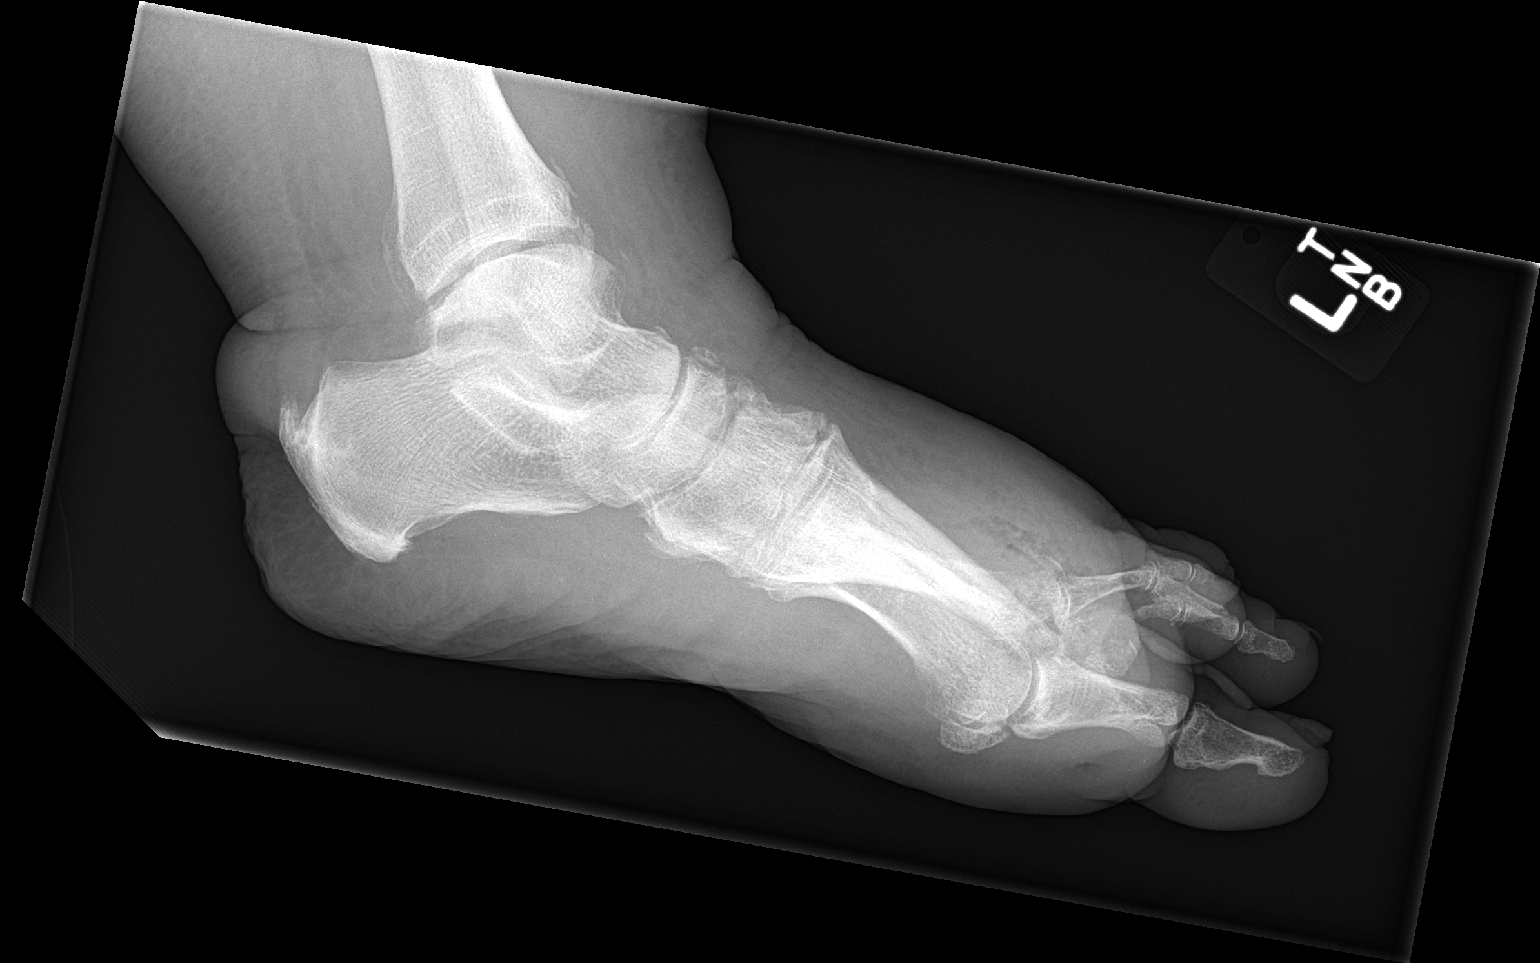

[3 of 3 positions shown; findings below may reference images not displayed]

FINDINGS: Irregularity, deformity and osteolysis at the third, fourth and
fifth MTP joints noted compatible with acute osteomyelitis.

Fragmentation of the proximal aspect of the third toe proximal
phalanx noted.

Deformity of the second third and fourth metatarsal heads noted.

There is been amputation of the second toe.

Diffuse soft tissue swelling with gas noted.
IMPRESSION: Findings compatible with acute osteomyelitis at the third, fourth
and fifth MTP joints. Deformity and fragmentation as described.

Irregularity of the distal second metatarsal which could be related
to chronic/amputation changes.

Diffuse soft tissue swelling with gas.

## 2017-07-17 MED ORDER — SODIUM CHLORIDE 0.9 % IV SOLN
250.0000 mL | INTRAVENOUS | Status: DC | PRN
  Administered 2017-07-18 – 2017-07-21 (×2): 250 mL via INTRAVENOUS

## 2017-07-17 MED ORDER — ONDANSETRON HCL 4 MG PO TABS: 4.0000 mg | ORAL_TABLET | Freq: Four times a day (QID) | ORAL | Status: DC | PRN

## 2017-07-17 MED ORDER — VANCOMYCIN HCL 10 G IV SOLR
1500.0000 mg | Freq: Once | INTRAVENOUS | Status: AC
  Administered 2017-07-17: 1500 mg via INTRAVENOUS
  Filled 2017-07-17: qty 1500

## 2017-07-17 MED ORDER — TETANUS-DIPHTH-ACELL PERTUSSIS 5-2.5-18.5 LF-MCG/0.5 IM SUSP
0.5000 mL | Freq: Once | INTRAMUSCULAR | Status: AC
  Administered 2017-07-17: 0.5 mL via INTRAMUSCULAR
  Filled 2017-07-17: qty 0.5

## 2017-07-17 MED ORDER — SODIUM CHLORIDE 0.9% FLUSH
3.0000 mL | Freq: Two times a day (BID) | INTRAVENOUS | Status: DC
  Administered 2017-07-17 – 2017-07-22 (×7): 3 mL via INTRAVENOUS

## 2017-07-17 MED ORDER — ONDANSETRON HCL 4 MG/2ML IJ SOLN: 4.0000 mg | Freq: Four times a day (QID) | INTRAMUSCULAR | Status: DC | PRN

## 2017-07-17 MED ORDER — ACETAMINOPHEN 325 MG PO TABS: 650.0000 mg | ORAL_TABLET | Freq: Four times a day (QID) | ORAL | Status: DC | PRN

## 2017-07-17 MED ORDER — HYDRALAZINE HCL 25 MG PO TABS
50.0000 mg | ORAL_TABLET | Freq: Three times a day (TID) | ORAL | Status: DC
  Administered 2017-07-17 – 2017-07-23 (×17): 50 mg via ORAL
  Filled 2017-07-17 (×17): qty 2

## 2017-07-17 MED ORDER — ACETAMINOPHEN 650 MG RE SUPP: 650.0000 mg | Freq: Four times a day (QID) | RECTAL | Status: DC | PRN

## 2017-07-17 MED ORDER — VANCOMYCIN HCL 10 G IV SOLR
1250.0000 mg | Freq: Two times a day (BID) | INTRAVENOUS | Status: DC
  Administered 2017-07-18 – 2017-07-19 (×4): 1250 mg via INTRAVENOUS
  Filled 2017-07-17 (×7): qty 1250

## 2017-07-17 MED ORDER — SODIUM CHLORIDE 0.9% FLUSH: 3.0000 mL | INTRAVENOUS | Status: DC | PRN

## 2017-07-17 MED ORDER — INSULIN ASPART 100 UNIT/ML ~~LOC~~ SOLN
0.0000 [IU] | Freq: Three times a day (TID) | SUBCUTANEOUS | Status: DC
  Administered 2017-07-17: 7 [IU] via SUBCUTANEOUS
  Administered 2017-07-18 (×2): 2 [IU] via SUBCUTANEOUS

## 2017-07-17 MED ORDER — CIPROFLOXACIN IN D5W 400 MG/200ML IV SOLN
400.0000 mg | Freq: Once | INTRAVENOUS | Status: AC
  Administered 2017-07-17: 400 mg via INTRAVENOUS
  Filled 2017-07-17: qty 200

## 2017-07-17 MED ORDER — CEFAZOLIN SODIUM-DEXTROSE 1-4 GM/50ML-% IV SOLN
1.0000 g | Freq: Once | INTRAVENOUS | Status: AC
  Administered 2017-07-17: 1 g via INTRAVENOUS
  Filled 2017-07-17: qty 50

## 2017-07-17 MED ORDER — INSULIN ASPART 100 UNIT/ML ~~LOC~~ SOLN: 0.0000 [IU] | Freq: Three times a day (TID) | SUBCUTANEOUS | Status: DC

## 2017-07-17 MED ORDER — PIPERACILLIN-TAZOBACTAM 3.375 G IVPB
3.3750 g | Freq: Three times a day (TID) | INTRAVENOUS | Status: DC
  Administered 2017-07-17 – 2017-07-23 (×17): 3.375 g via INTRAVENOUS
  Filled 2017-07-17 (×13): qty 50

## 2017-07-17 NOTE — Progress Notes (Signed)
Subjective:     Natalie Contreras  Status post left second toe amputation.  Patient continues to have drainage from the open wound.  In addition, she has worsening swelling in both lower extremities.  She states her blood sugars have been dropping recently.  She denies any fevers. Objective:    BP (!) 191/78   Pulse 71   Temp (!) 96.6 F (35.9 C)   Resp 18   Ht 5\' 4"  (1.626 m)   Wt 209 lb (94.8 kg)   LMP 12/16/2015   BMI 35.87 kg/m   General:  alert, cooperative and no distress  Left foot swollen and slightly erythematous.  She does have pretibial edema.  This is bilaterally.  Serous drainage noted from wound.  No purulent drainage is noted.  Left fifth toe cyanotic.  Dry gangrene noted on tip of left fourth toe.     Assessment:    Nonhealing diabetic foot wound, bilateral lower extremity swelling, uncontrolled diabetes mellitus    Plan:   I have told the patient to go to the emergency room for admission.  She needs workup to explain her bilateral lower extremity swelling.  Also need to get her blood sugars under better control.  She will need IV antibiotics.  May need to go back to the operating room for transmetatarsal amputation.

## 2017-07-17 NOTE — Progress Notes (Signed)
Pharmacy Antibiotic Note  Natalie Contreras is a 51 y.o. female admitted on 07/17/2017 with Wound infection.  Pharmacy has been consulted for vancomycin and zosyn dosing.  Plan: Vancomycin 1500 mg IV loading dose then, Vancomycin 1250 IV every 12 hours.  Goal trough 15-20 mcg/mL. Zosyn 3.375g IV q8h (4 hour infusion).  Height: 5\' 4"  (162.6 cm) Weight: 209 lb (94.8 kg) IBW/kg (Calculated) : 54.7  Temp (24hrs), Avg:97.4 F (36.3 C), Min:96.6 F (35.9 C), Max:98.1 F (36.7 C)   Recent Labs Lab 07/17/17 1216  WBC 11.3*  CREATININE 1.14*    Estimated Creatinine Clearance: 66.6 mL/min (A) (by C-G formula based on SCr of 1.14 mg/dL (H)).    No Known Allergies  Antimicrobials this admission: Vancomycin  11/1 >>  Zosyn  11/1 >>  Ancef 11/1 x 1 dose ED Cipro 11/1 x 1 dose ED    Thank you for allowing pharmacy to be a part of this patient's care.  Chriss Czar 07/17/2017 4:41 PM

## 2017-07-17 NOTE — ED Triage Notes (Signed)
PT c/o increased bilateral leg swelling with redness and trouble ambulating x3 days. PT seen at Dr. Arnoldo Morale office this am for work-in due to redness and he told her to come to ED for eval. PT also reports frequent periods of hypoglycemia over the past week with increased weakness.

## 2017-07-17 NOTE — ED Provider Notes (Signed)
[facility] EMERGENCY DEPARTMENT Provider Note   CSN: [id number] Arrival date & time: 07/17/17  1107     History   Chief Complaint Chief Complaint  Patient presents with  . Leg Swelling  . Weakness    HPI Natalie Contreras is a 51 y.o. female.  Complains of bilateral leg swelling for the past 1 week.  She denies pain anywhere.  She is also complaining of generalized weakness to the point where she could not get out of a recliner yesterday.  Other associated symptoms patient reports that her blood sugar has gone low.  The lowest that she recalls is 70 causing her to "pass out" her last low blood sugar was yesterday.  She does not recall the value.  She saw Dr. Arnoldo Morale today in the office who recommended that she come here for evaluation of leg edema admission and intravenous antibiotics  HPI  Past Medical History:  Diagnosis Date  . Acute diastolic CHF (congestive heart failure) ([place]) 10/28/2016  . Carotid stenosis, asymptomatic, bilateral 01/15/2017  . Diabetes mellitus without complication ([place])   . Heart murmur   . Hypertension   . PAD (peripheral artery disease) ([place]) 04/10/2017   Left ABI 0.74. Right ABI 0.96.    Patient Active Problem List   Diagnosis Date Noted  . Toe amputation status, left ([place]) 05/05/2017  . Gangrene of toe of left foot ([place])   . PAD (peripheral artery disease) ([place]) 04/10/2017  . Essential hypertension 04/10/2017  . Chronic diastolic CHF (congestive heart failure) ([place]) 04/10/2017  . AKI (acute kidney injury) ([place]) 04/10/2017  . Foreign body in left foot   . Cellulitis and abscess of foot 04/07/2017  . Carotid stenosis, asymptomatic, bilateral 01/15/2017  . Visual disturbance 01/15/2017  . Tobacco abuse 01/15/2017  . Acute respiratory failure with hypoxia ([place]) 10/30/2016  . Pulmonary hypertension ([place]) 10/29/2016  . Acute diastolic CHF (congestive heart failure) ([place]) 10/28/2016  . Benign essential HTN 10/28/2016  . Hyperlipidemia 10/28/2016  .  Diabetes mellitus with neuropathy ([place]) 10/28/2016    Past Surgical History:  Procedure Laterality Date  . AMPUTATION TOE Left 05/05/2017   Procedure: LEFT SECOND TOE AMPUTATION;  Surgeon: Aviva Signs, MD;  Location: [facility] ORS;  Service: General;  Laterality: Left;  . CATARACT EXTRACTION W/PHACO Left 02/08/2016   Procedure: CATARACT EXTRACTION PHACO AND INTRAOCULAR LENS PLACEMENT (IOC);  Surgeon: Tonny Branch, MD;  Location: [facility];  Service: Ophthalmology;  Laterality: Left;  CDE 11.43   . CATARACT EXTRACTION W/PHACO Right 02/26/2016   Procedure: CATARACT EXTRACTION PHACO AND INTRAOCULAR LENS PLACEMENT RIGHT; CDE:  16.90;  Surgeon: Tonny Branch, MD;  Location: [facility];  Service: Ophthalmology;  Laterality: Right;  . CHOLECYSTECTOMY    . FOREIGN BODY REMOVAL Left 04/09/2017   Procedure: FOREIGN BODY REMOVAL ADULT FOOT;  Surgeon: Aviva Signs, MD;  Location: [facility];  Service: General;  Laterality: Left;    OB History    Gravida Para Term Preterm AB Living             2   SAB TAB Ectopic Multiple Live Births                   Home Medications    Prior to Admission medications   Medication Sig Start Date End Date Taking? Authorizing Provider  aspirin EC 81 MG tablet Take 81 mg by mouth daily.    [provider]  carvedilol (COREG) 6.25 MG tablet TAKE (1) TABLET BY MOUTH TWICE DAILY. 06/27/17  Arnoldo Lenis, MD  glimepiride (AMARYL) 2 MG tablet Take 2 mg by mouth daily with breakfast.  03/15/17   [provider]  hydrALAZINE (APRESOLINE) 50 MG tablet Take 1 tablet (50 mg total) by mouth 3 (three) times daily. 01/15/17 05/02/17  Imogene Burn, PA-C  lovastatin (MEVACOR) 40 MG tablet Take 40 mg by mouth at bedtime.  07/29/16   [provider]  pyridostigmine (MESTINON) 60 MG tablet Take 60 mg by mouth every 8 (eight) hours.  03/12/17   [provider]  sulfamethoxazole-trimethoprim (BACTRIM) 400-80 MG tablet Take 1 tablet by mouth 2 (two) times daily.  06/05/17   Aviva Signs, MD  torsemide (DEMADEX) 20 MG tablet Take 40 mg in the AM and 20 mg in the PM 02/07/17   Branch, Alphonse Guild, MD    Family History Family History  Problem Relation Age of Onset  . Lung cancer Mother   . Lung cancer Father   . AAA (abdominal aortic aneurysm) Brother     Social History Social History  Substance Use Topics  . Smoking status: Current Every Day Smoker    Packs/day: 0.75    Years: 25.00    Types: Cigarettes    Start date: 09/16/1984  . Smokeless tobacco: Never Used  . Alcohol use No     Allergies   Patient has no known allergies.   Review of Systems Review of Systems  Constitutional: Positive for fatigue.  Cardiovascular: Positive for leg swelling.  Skin: Positive for color change and wound.       Open wound of the left foot reddened bilateral lower extremities  Allergic/Immunologic: Positive for immunocompromised state.       Diabetic  Neurological: Positive for weakness.       Generalized weakness     Physical Exam Updated Vital Signs BP (!) 151/62 (BP Location: Right Arm)   Pulse 75   Temp 98.1 F (36.7 C) (Oral)   Resp 17   Ht 5\' 4"  (1.626 m)   Wt 94.8 kg (209 lb)   LMP 12/16/2015   SpO2 95%   BMI 35.87 kg/m   Physical Exam  Constitutional: She appears well-developed and well-nourished.  HENT:  Head: Normocephalic and atraumatic.  Eyes: Pupils are equal, round, and reactive to light. Conjunctivae are normal.  Neck: Neck supple. No tracheal deviation present. No thyromegaly present.  Cardiovascular: Normal rate and regular rhythm.   No murmur heard. Pulmonary/Chest: Effort normal and breath sounds normal.  Abdominal: Soft. Bowel sounds are normal. She exhibits no distension. There is no tenderness.  Musculoskeletal: Normal range of motion. She exhibits no edema or tenderness.  Left lower extremity second toe is amputated at site of amputation wound is foul-smelling, draining serous material.  Fourth toe distally is  black and necrotic appearing fifth toe is cyanotic.  DP and PT pulses obtainable by Doppler.  Leg below the knee is edematous with 2+ edema and slightly reddened.  Nontender.  Right lower extremity 2+ edema, slightly reddened.  DP and PT pulses obtainable with Doppler.  Bilateral upper extremities without redness swelling or tenderness neurovascularly intact  Neurological: She is alert. Coordination normal.  Skin: Skin is warm and dry. No rash noted.  Psychiatric: She has a normal mood and affect.  Nursing note and vitals reviewed.    ED Treatments / Results  Labs (all labs ordered are listed, but only abnormal results are displayed) Labs Reviewed  COMPREHENSIVE METABOLIC PANEL - Abnormal; Notable for the following:  Result Value   Sodium 133 (*)    Chloride 99 (*)    Glucose, Bld 310 (*)    BUN 35 (*)    Creatinine, Ser 1.14 (*)    Calcium 8.8 (*)    Albumin 2.7 (*)    Alkaline Phosphatase 313 (*)    GFR calc non Af Amer 56 (*)    All other components within normal limits  CBC WITH DIFFERENTIAL/PLATELET - Abnormal; Notable for the following:    WBC 11.3 (*)    RBC 3.64 (*)    Hemoglobin 8.9 (*)    HCT 28.6 (*)    MCH 24.5 (*)    RDW 16.2 (*)    Platelets 426 (*)    Neutro Abs 9.4 (*)    All other components within normal limits  CBG MONITORING, ED - Abnormal; Notable for the following:    Glucose-Capillary 288 (*)    All other components within normal limits  CULTURE, BLOOD (ROUTINE X 2)  CULTURE, BLOOD (ROUTINE X 2)    EKG  EKG Interpretation None       Radiology No results found.  Procedures Procedures (including critical care time)  Medications Ordered in ED Medications  Tdap (BOOSTRIX) injection 0.5 mL (not administered)  ceFAZolin (ANCEF) IVPB 1 g/50 mL premix (not administered)    Results for orders placed or performed during the hospital encounter of 07/17/17  Comprehensive metabolic panel  Result Value Ref Range   Sodium 133 (L) 135 - 145  mmol/L   Potassium 4.4 3.5 - 5.1 mmol/L   Chloride 99 (L) 101 - 111 mmol/L   CO2 24 22 - 32 mmol/L   Glucose, Bld 310 (H) 65 - 99 mg/dL   BUN 35 (H) 6 - 20 mg/dL   Creatinine, Ser 1.14 (H) 0.44 - 1.00 mg/dL   Calcium 8.8 (L) 8.9 - 10.3 mg/dL   Total Protein 7.2 6.5 - 8.1 g/dL   Albumin 2.7 (L) 3.5 - 5.0 g/dL   AST 36 15 - 41 U/L   ALT 42 14 - 54 U/L   Alkaline Phosphatase 313 (H) 38 - 126 U/L   Total Bilirubin 0.7 0.3 - 1.2 mg/dL   GFR calc non Af Amer 56 (L) >60 mL/min   GFR calc Af Amer >60 >60 mL/min   Anion gap 10 5 - 15  CBC with Differential  Result Value Ref Range   WBC 11.3 (H) 4.0 - 10.5 K/uL   RBC 3.64 (L) 3.87 - 5.11 MIL/uL   Hemoglobin 8.9 (L) 12.0 - 15.0 g/dL   HCT 28.6 (L) 36.0 - 46.0 %   MCV 78.6 78.0 - 100.0 fL   MCH 24.5 (L) 26.0 - 34.0 pg   MCHC 31.1 30.0 - 36.0 g/dL   RDW 16.2 (H) 11.5 - 15.5 %   Platelets 426 (H) 150 - 400 K/uL   Neutrophils Relative % 84 %   Neutro Abs 9.4 (H) 1.7 - 7.7 K/uL   Lymphocytes Relative 10 %   Lymphs Abs 1.2 0.7 - 4.0 K/uL   Monocytes Relative 5 %   Monocytes Absolute 0.6 0.1 - 1.0 K/uL   Eosinophils Relative 1 %   Eosinophils Absolute 0.1 0.0 - 0.7 K/uL   Basophils Relative 0 %   Basophils Absolute 0.0 0.0 - 0.1 K/uL  CBG monitoring, ED  Result Value Ref Range   Glucose-Capillary 288 (H) 65 - 99 mg/dL   US Venous Img Lower Bilateral  Result Date: 07/17/2017 CLINICAL DATA:  51 year old female with a history of swelling EXAM: BILATERAL LOWER EXTREMITY VENOUS DOPPLER ULTRASOUND TECHNIQUE: Gray-scale sonography with graded compression, as well as color Doppler and duplex ultrasound were performed to evaluate the lower extremity deep venous systems from the level of the common femoral vein and including the common femoral, femoral, profunda femoral, popliteal and calf veins including the posterior tibial, peroneal and gastrocnemius veins when visible. The superficial great saphenous vein was also interrogated. Spectral Doppler  was utilized to evaluate flow at rest and with distal augmentation maneuvers in the common femoral, femoral and popliteal veins. COMPARISON:  None. FINDINGS: RIGHT LOWER EXTREMITY Common Femoral Vein: No evidence of thrombus. Normal compressibility, respiratory phasicity and response to augmentation. Saphenofemoral Junction: No evidence of thrombus. Normal compressibility and flow on color Doppler imaging. Profunda Femoral Vein: No evidence of thrombus. Normal compressibility and flow on color Doppler imaging. Femoral Vein: No evidence of thrombus. Normal compressibility, respiratory phasicity and response to augmentation. Popliteal Vein: No evidence of thrombus. Normal compressibility, respiratory phasicity and response to augmentation. Calf Veins: No evidence of thrombus. Normal compressibility and flow on color Doppler imaging. Superficial Great Saphenous Vein: No evidence of thrombus. Normal compressibility and flow on color Doppler imaging. Other Findings:  Edema LEFT LOWER EXTREMITY Common Femoral Vein: No evidence of thrombus. Normal compressibility, respiratory phasicity and response to augmentation. Saphenofemoral Junction: No evidence of thrombus. Normal compressibility and flow on color Doppler imaging. Profunda Femoral Vein: No evidence of thrombus. Normal compressibility and flow on color Doppler imaging. Femoral Vein: No evidence of thrombus. Normal compressibility, respiratory phasicity and response to augmentation. Popliteal Vein: No evidence of thrombus. Normal compressibility, respiratory phasicity and response to augmentation. Calf Veins: No evidence of thrombus. Normal compressibility and flow on color Doppler imaging. Superficial Great Saphenous Vein: No evidence of thrombus. Normal compressibility and flow on color Doppler imaging. Other Findings:  Edema IMPRESSION: Sonographic survey of the bilateral lower extremities negative for DVT. Edema of the lower extremities. Electronically Signed    By: Corrie Mckusick D.O.   On: 07/17/2017 15:13   Dg Foot Complete Left  Result Date: 07/17/2017 CLINICAL DATA:  Left foot pain, swelling and redness for 3 days. EXAM: LEFT FOOT - COMPLETE 3+ VIEW COMPARISON:  04/07/2017 FINDINGS: Irregularity, deformity and osteolysis at the third, fourth and fifth MTP joints noted compatible with acute osteomyelitis. Fragmentation of the proximal aspect of the third toe proximal phalanx noted. Deformity of the second third and fourth metatarsal heads noted. There is been amputation of the second toe. Diffuse soft tissue swelling with gas noted. IMPRESSION: Findings compatible with acute osteomyelitis at the third, fourth and fifth MTP joints. Deformity and fragmentation as described. Irregularity of the distal second metatarsal which could be related to chronic/amputation changes. Diffuse soft tissue swelling with gas. Electronically Signed   By: Margarette Canada M.D.   On: 07/17/2017 15:28   Initial Impression / Assessment and Plan / ED Course  I have reviewed the triage vital signs and the nursing notes.  Pertinent labs & imaging results that were available during my care of the patient were reviewed by me and considered in my medical decision making (see chart for details).     3:35 PM patient resting comfortably treatment with intravenous antibiotics.  Patient will likely need surgery on left foot to include partial amputation.  Patient is aware.  I consulted Dr. Arnoldo Morale general surgery who will see patient in hospital.. Consulted Dr. Shanon Brow from hospitalist service who will arrange for overnight stay and  admission to renal insufficiency is chronic Final Clinical Impressions(s) / ED Diagnoses  Diagnosis #1 osteomyelitis of left foot #2 hyperglycemia #3 generalized weakness #4 chronic renal insufficiency Final diagnoses:  None    New Prescriptions New Prescriptions   No medications on file     Orlie Dakin, MD 07/17/17 1551

## 2017-07-17 NOTE — H&P (Signed)
History and Physical    CATHERYNE DEFORD YKD:[id number] DOB: Jul 10, 1966 DOA: 07/17/2017  PCP: [name], MD  Patient coming from:  home  Chief Complaint:   Foot swollen and red  HPI: LORENIA HOSTON is a 51 y.o. female with medical history significant of recent left toe amputation by dr Arnoldo Morale, chf diastolic , PAD, DM comes in with worsening swelling and redness and foul order to left wound.  Pt saw dr Arnoldo Morale today in follow up and he sent to ED for admission for iv abx.  Pt denies any fevers.  The foot has been worsening for days.  No n/v/d.  There has been swelling in the legs also, denies sob or cp.  Pt found to have wound infection with likely osteo and will likely need further amputation of foot.   Review of Systems: As per HPI otherwise 10 point review of systems negative.   Past Medical History:  Diagnosis Date  . Acute diastolic CHF (congestive heart failure) ([place]) 10/28/2016  . Carotid stenosis, asymptomatic, bilateral 01/15/2017  . Diabetes mellitus without complication ([place])   . Heart murmur   . Hypertension   . PAD (peripheral artery disease) ([place]) 04/10/2017   Left ABI 0.74. Right ABI 0.96.    Past Surgical History:  Procedure Laterality Date  . AMPUTATION TOE Left 05/05/2017   Procedure: LEFT SECOND TOE AMPUTATION;  Surgeon: Aviva Signs, MD;  Location: [facility];  Service: General;  Laterality: Left;  . CATARACT EXTRACTION W/PHACO Left 02/08/2016   Procedure: CATARACT EXTRACTION PHACO AND INTRAOCULAR LENS PLACEMENT (IOC);  Surgeon: Tonny Branch, MD;  Location: [facility];  Service: Ophthalmology;  Laterality: Left;  CDE 11.43   . CATARACT EXTRACTION W/PHACO Right 02/26/2016   Procedure: CATARACT EXTRACTION PHACO AND INTRAOCULAR LENS PLACEMENT RIGHT; CDE:  16.90;  Surgeon: Tonny Branch, MD;  Location: [facility];  Service: Ophthalmology;  Laterality: Right;  . CHOLECYSTECTOMY    . FOREIGN BODY REMOVAL Left 04/09/2017   Procedure: FOREIGN BODY REMOVAL ADULT FOOT;  Surgeon: Aviva Signs, MD;  Location: [facility];  Service: General;  Laterality: Left;     reports that she has been smoking Cigarettes.  She started smoking about 32 years ago. She has a 18.75 pack-year smoking history. She has never used smokeless tobacco. She reports that she does not drink alcohol or use drugs.  No Known Allergies  Family History  Problem Relation Age of Onset  . Lung cancer Mother   . Lung cancer Father   . AAA (abdominal aortic aneurysm) Brother     Prior to Admission medications   Medication Sig Start Date End Date Taking? Authorizing Provider  aspirin EC 81 MG tablet Take 81 mg by mouth daily.   Yes [provider]  carvedilol (COREG) 6.25 MG tablet TAKE (1) TABLET BY MOUTH TWICE DAILY. 06/27/17  Yes Branch, Alphonse Guild, MD  glimepiride (AMARYL) 2 MG tablet Take 2 mg by mouth daily with breakfast.  03/15/17  Yes [provider]  hydrALAZINE (APRESOLINE) 50 MG tablet Take 1 tablet (50 mg total) by mouth 3 (three) times daily. 01/15/17 07/17/17 Yes Imogene Burn, PA-C  metFORMIN (GLUCOPHAGE) 500 MG tablet Take 500 mg by mouth 2 (two) times daily with a meal.   Yes [provider]  sulfamethoxazole-trimethoprim (BACTRIM) 400-80 MG tablet Take 1 tablet by mouth 2 (two) times daily. Patient not taking: Reported on 07/17/2017 06/05/17   Aviva Signs, MD  torsemide (DEMADEX) 20 MG tablet Take 40 mg in the  AM and 20 mg in the PM Patient not taking: Reported on 07/17/2017 02/07/17   Arnoldo Lenis, MD    Physical Exam: Vitals:   07/17/17 1138  BP: (!) 151/62  Pulse: 75  Resp: 17  Temp: 98.1 F (36.7 C)  TempSrc: Oral  SpO2: 95%  Weight: 94.8 kg (209 lb)  Height: 5\' 4"  (1.626 m)     Constitutional: NAD, calm, comfortable Vitals:   07/17/17 1138  BP: (!) 151/62  Pulse: 75  Resp: 17  Temp: 98.1 F (36.7 C)  TempSrc: Oral  SpO2: 95%  Weight: 94.8 kg (209 lb)  Height: 5\' 4"  (1.626 m)   Eyes: PERRL, lids and conjunctivae normal ENMT: Mucous  membranes are moist. Posterior pharynx clear of any exudate or lesions.Normal dentition.  Neck: normal, supple, no masses, no thyromegaly Respiratory: clear to auscultation bilaterally, no wheezing, no crackles. Normal respiratory effort. No accessory muscle use.  Cardiovascular: Regular rate and rhythm, no murmurs / rubs / gallops.2+ extremity edema. 1+ pedal pulses. No carotid bruits.  Abdomen: no tenderness, no masses palpated. No hepatosplenomegaly. Bowel sounds positive.  Musculoskeletal: no clubbing / cyanosis. No joint deformity upper and lower extremities. Good ROM, no contractures. Normal muscle tone.  Skin: edema and redness to bilateral legs, foul odor from left open foot wound Neurologic: CN 2-12 grossly intact Strength 5/5 in all 4.  Psychiatric: Normal judgment and insight. Alert and oriented x 3. Normal mood.    Labs on Admission: I have personally reviewed following labs and imaging studies  CBC:  Recent Labs Lab 07/17/17 1216  WBC 11.3*  NEUTROABS 9.4*  HGB 8.9*  HCT 28.6*  MCV 78.6  PLT 244*   Basic Metabolic Panel:  Recent Labs Lab 07/17/17 1216  NA 133*  K 4.4  CL 99*  CO2 24  GLUCOSE 310*  BUN 35*  CREATININE 1.14*  CALCIUM 8.8*   GFR: Estimated Creatinine Clearance: 66.6 mL/min (A) (by C-G formula based on SCr of 1.14 mg/dL (H)). Liver Function Tests:  Recent Labs Lab 07/17/17 1216  AST 36  ALT 42  ALKPHOS 313*  BILITOT 0.7  PROT 7.2  ALBUMIN 2.7*   CBG:  Recent Labs Lab 07/17/17 1141  GLUCAP 288*    Recent Results (from the past 240 hour(s))  Blood culture (routine x 2)     Status: None (Preliminary result)   Collection Time: 07/17/17  2:39 PM  Result Value Ref Range Status   Specimen Description RIGHT ANTECUBITAL  Final   Special Requests   Final    BOTTLES DRAWN AEROBIC AND ANAEROBIC Blood Culture adequate volume   Culture PENDING  Incomplete   Report Status PENDING  Incomplete     Radiological Exams on Admission: US  Venous Img Lower Bilateral  Result Date: 07/17/2017 CLINICAL DATA:  51 year old female with a history of swelling EXAM: BILATERAL LOWER EXTREMITY VENOUS DOPPLER ULTRASOUND TECHNIQUE: Gray-scale sonography with graded compression, as well as color Doppler and duplex ultrasound were performed to evaluate the lower extremity deep venous systems from the level of the common femoral vein and including the common femoral, femoral, profunda femoral, popliteal and calf veins including the posterior tibial, peroneal and gastrocnemius veins when visible. The superficial great saphenous vein was also interrogated. Spectral Doppler was utilized to evaluate flow at rest and with distal augmentation maneuvers in the common femoral, femoral and popliteal veins. COMPARISON:  None. FINDINGS: RIGHT LOWER EXTREMITY Common Femoral Vein: No evidence of thrombus. Normal compressibility, respiratory phasicity and response to  augmentation. Saphenofemoral Junction: No evidence of thrombus. Normal compressibility and flow on color Doppler imaging. Profunda Femoral Vein: No evidence of thrombus. Normal compressibility and flow on color Doppler imaging. Femoral Vein: No evidence of thrombus. Normal compressibility, respiratory phasicity and response to augmentation. Popliteal Vein: No evidence of thrombus. Normal compressibility, respiratory phasicity and response to augmentation. Calf Veins: No evidence of thrombus. Normal compressibility and flow on color Doppler imaging. Superficial Great Saphenous Vein: No evidence of thrombus. Normal compressibility and flow on color Doppler imaging. Other Findings:  Edema LEFT LOWER EXTREMITY Common Femoral Vein: No evidence of thrombus. Normal compressibility, respiratory phasicity and response to augmentation. Saphenofemoral Junction: No evidence of thrombus. Normal compressibility and flow on color Doppler imaging. Profunda Femoral Vein: No evidence of thrombus. Normal compressibility and flow on  color Doppler imaging. Femoral Vein: No evidence of thrombus. Normal compressibility, respiratory phasicity and response to augmentation. Popliteal Vein: No evidence of thrombus. Normal compressibility, respiratory phasicity and response to augmentation. Calf Veins: No evidence of thrombus. Normal compressibility and flow on color Doppler imaging. Superficial Great Saphenous Vein: No evidence of thrombus. Normal compressibility and flow on color Doppler imaging. Other Findings:  Edema IMPRESSION: Sonographic survey of the bilateral lower extremities negative for DVT. Edema of the lower extremities. Electronically Signed   By: Corrie Mckusick D.O.   On: 07/17/2017 15:13   Dg Foot Complete Left  Result Date: 07/17/2017 CLINICAL DATA:  Left foot pain, swelling and redness for 3 days. EXAM: LEFT FOOT - COMPLETE 3+ VIEW COMPARISON:  04/07/2017 FINDINGS: Irregularity, deformity and osteolysis at the third, fourth and fifth MTP joints noted compatible with acute osteomyelitis. Fragmentation of the proximal aspect of the third toe proximal phalanx noted. Deformity of the second third and fourth metatarsal heads noted. There is been amputation of the second toe. Diffuse soft tissue swelling with gas noted. IMPRESSION: Findings compatible with acute osteomyelitis at the third, fourth and fifth MTP joints. Deformity and fragmentation as described. Irregularity of the distal second metatarsal which could be related to chronic/amputation changes. Diffuse soft tissue swelling with gas. Electronically Signed   By: Margarette Canada M.D.   On: 07/17/2017 15:28    Old chart reviewed Case discussed with edp  Assessment/Plan 51 yo female with left foot cellulitis and osteomyelitis  Principal Problem:   Cellulitis and osteo of left foot- iv vanc and zosyn.  Elevate legs.  Lasix.  Consult dr Arnoldo Morale, who is aware pt will likely need further amputation  Active Problems:   Toe amputation status, left ([place])- as above   Diabetes  mellitus with neuropathy ([place])- place on SSI   Pulmonary hypertension ([place])- clarify home meds   PAD (peripheral artery disease) ([place])- noted, hold aspirin until clear when going to OR   Essential hypertension- stable   Chronic diastolic CHF (congestive heart failure) ([place])- change po lasix to iv lasix 40 mg iv q 24 hours, elevate legs.  Had echo recently per cardiology notes, diastolic dysfunction with normal EF.     DVT prophylaxis:  scds Code Status:  full Family Communication:  none Disposition Plan:  Per day team Consults called:  General surgery Admission status:  admission   Minah Axelrod A MD [facility]  If 7PM-7AM, please contact night-coverage [email or web address] Password [email or web address]  07/17/2017, 4:05 PM

## 2017-07-18 DIAGNOSIS — E0840 Diabetes mellitus due to underlying condition with diabetic neuropathy, unspecified: Secondary | ICD-10-CM

## 2017-07-18 LAB — BASIC METABOLIC PANEL
ANION GAP: 8 (ref 5–15)
BUN: 29 mg/dL — ABNORMAL HIGH (ref 6–20)
CO2: 24 mmol/L (ref 22–32)
Calcium: 8.4 mg/dL — ABNORMAL LOW (ref 8.9–10.3)
Chloride: 104 mmol/L (ref 101–111)
Creatinine, Ser: 1.01 mg/dL — ABNORMAL HIGH (ref 0.44–1.00)
GFR calc Af Amer: >60 mL/min (ref >60)
GLUCOSE: 166 mg/dL — AB (ref 65–99)
POTASSIUM: 4.3 mmol/L (ref 3.5–5.1)
Sodium: 136 mmol/L (ref 135–145)

## 2017-07-18 LAB — GLUCOSE, CAPILLARY
GLUCOSE-CAPILLARY: 194 mg/dL — AB (ref 65–99)
GLUCOSE-CAPILLARY: 161 mg/dL — AB (ref 65–99)
Glucose-Capillary: 191 mg/dL — ABNORMAL HIGH (ref 65–99)
Glucose-Capillary: 124 mg/dL — ABNORMAL HIGH (ref 65–99)

## 2017-07-18 LAB — CBC
HEMATOCRIT: 24.7 % — AB (ref 36.0–46.0)
HEMOGLOBIN: 7.6 g/dL — AB (ref 12.0–15.0)
MCH: 24.5 pg — ABNORMAL LOW (ref 26.0–34.0)
MCHC: 30.8 g/dL (ref 30.0–36.0)
MCV: 79.7 fL (ref 78.0–100.0)
Platelets: 376 10*3/uL (ref 150–400)
RBC: 3.1 MIL/uL — AB (ref 3.87–5.11)
RDW: 16.5 % — ABNORMAL HIGH (ref 11.5–15.5)
WBC: 9.8 10*3/uL (ref 4.0–10.5)

## 2017-07-18 LAB — VITAMIN B12: Vitamin B-12: 796 pg/mL (ref 180–914)

## 2017-07-18 LAB — IRON AND TIBC
IRON: 21 ug/dL — AB (ref 28–170)
Saturation Ratios: 10 % — ABNORMAL LOW (ref 10.4–31.8)
TIBC: 211 ug/dL — ABNORMAL LOW (ref 250–450)
UIBC: 190 ug/dL

## 2017-07-18 LAB — FERRITIN: FERRITIN: 347 ng/mL — AB (ref 11–307)

## 2017-07-18 LAB — RETICULOCYTES
RBC.: 3.34 MIL/uL — ABNORMAL LOW (ref 3.87–5.11)
RETIC COUNT ABSOLUTE: 90.2 10*3/uL (ref 19.0–186.0)
Retic Ct Pct: 2.7 % (ref 0.4–3.1)

## 2017-07-18 LAB — FOLATE: Folate: 11.1 ng/mL (ref >5.9)

## 2017-07-18 MED ORDER — ENOXAPARIN SODIUM 40 MG/0.4ML ~~LOC~~ SOLN
40.0000 mg | SUBCUTANEOUS | Status: DC
  Administered 2017-07-18 – 2017-07-22 (×4): 40 mg via SUBCUTANEOUS
  Filled 2017-07-18 (×5): qty 0.4

## 2017-07-18 MED ORDER — INSULIN ASPART 100 UNIT/ML ~~LOC~~ SOLN
0.0000 [IU] | Freq: Every day | SUBCUTANEOUS | Status: DC
  Administered 2017-07-20 – 2017-07-21 (×2): 2 [IU] via SUBCUTANEOUS

## 2017-07-18 MED ORDER — INSULIN ASPART 100 UNIT/ML ~~LOC~~ SOLN
0.0000 [IU] | Freq: Three times a day (TID) | SUBCUTANEOUS | Status: DC
  Administered 2017-07-18: 3 [IU] via SUBCUTANEOUS
  Administered 2017-07-19: 2 [IU] via SUBCUTANEOUS
  Administered 2017-07-19: 3 [IU] via SUBCUTANEOUS
  Administered 2017-07-19 – 2017-07-20 (×2): 5 [IU] via SUBCUTANEOUS
  Administered 2017-07-20: 8 [IU] via SUBCUTANEOUS
  Administered 2017-07-20 – 2017-07-22 (×3): 3 [IU] via SUBCUTANEOUS
  Administered 2017-07-22: 5 [IU] via SUBCUTANEOUS
  Administered 2017-07-22: 3 [IU] via SUBCUTANEOUS
  Administered 2017-07-23: 2 [IU] via SUBCUTANEOUS
  Administered 2017-07-23: 5 [IU] via SUBCUTANEOUS

## 2017-07-18 NOTE — Care Management Note (Addendum)
Case Management Note  Patient Details  Name: [name] MRN: [id number] Date of Birth: 1966/04/29  Subjective/Objective:   S/p left toe amputation. Adm with cellulitis/osteo. Will require transmetatarsal amputation.Scheduled for 07/21/2017 to allow for continued IV antibiotics and swelling to reduce. She is active with [facility]. From home, ind with ADL's. Has insurance and PCP.            Action/Plan:  Anticipate DC home with resumption of HH. Will follow.   Expected Discharge Date:  07/22/2017             Expected Discharge Plan:  [place]  In-House Referral:     Discharge planning Services  CM Consult  Post Acute Care Choice:  Home Health, Resumption of Svcs/PTA Provider Choice offered to:     DME Arranged:    DME Agency:     HH Arranged:    [place] Agency:     Status of Service:  In process, will continue to follow  If discussed at Long Length of Stay Meetings, dates discussed:    Additional Comments:  Ivori Storr, Chauncey Reading, RN 07/18/2017, 10:17 AM

## 2017-07-18 NOTE — Progress Notes (Signed)
PROGRESS NOTE    Natalie Contreras  WCH:[id number] DOB: 1965-09-30 DOA: 07/17/2017 PCP: [name], MD    Brief Narrative:  51 year old female with a history of hypertension, diabetes, admitted to the hospital with swelling/redness of her left foot and foul odor coming from the left foot wound.  She was found to have evidence of osteomyelitis on plain films done in the emergency room.  She is admitted for IV antibiotics and further surgical management.   Assessment & Plan:   Principal Problem:   Cellulitis and abscess of foot Active Problems:   Diabetes mellitus with neuropathy (H[facility])   Pulmonary hypertension (H[facility])   PAD (peripheral artery disease) ([facility])   Essential hypertension   Chronic diastolic CHF (congestive heart failure) ([facility])   Toe amputation status, left ([place])   1. Cellulitis/osteomyelitis of toes on left foot.  She is on intravenous antibiotics.  General surgery is following.  She will likely need transmetatarsal amputation.  Continue current treatments. 2. Diabetes.  Start on sliding scale insulin.  Oral medications on hold.  Blood sugars are stable. 3. Chronic diastolic CHF.  Appears compensated.  Diuretics currently on hold. 4. Anemia.  Suspect is related to chronic disease.  Likely related to her chronic foot infection.  No obvious signs of bleeding.  Transfuse for hemoglobin less than 7.  Check anemia panel. 5. Hypertension.  Blood pressure stable.  Continue on hydralazine   DVT prophylaxis: Lovenox Code Status: Full code Family Communication: No family present Disposition Plan: Discharge home once improved   Consultants:   General surgery  Procedures:     Antimicrobials:   Vancomycin 11/1 >  Zosyn 11/1 >   Subjective: Denies any pain in her foot.  Denies any shortness of breath or chest pain.  Objective: Vitals:   07/17/17 2003 07/17/17 2139 07/17/17 2147 07/18/17 0710  BP:  (!) 146/53 (!) 141/80 (!) 159/52  Pulse:  70 92 72  Resp:  18 20 20     Temp:  98.8 F (37.1 C)  98.6 F (37 C)  TempSrc:  Oral  Oral  SpO2: 92% 92% 100% 92%  Weight:      Height:        Intake/Output Summary (Last 24 hours) at 07/18/17 1518 Last data filed at 07/17/17 1900  Gross per 24 hour  Intake              990 ml  Output                0 ml  Net              990 ml   Filed Weights   07/17/17 1138 07/17/17 1709  Weight: 94.8 kg (209 lb) 94.8 kg (209 lb)    Examination:  General exam: Appears calm and comfortable  Respiratory system: Clear to auscultation. Respiratory effort normal. Cardiovascular system: S1 & S2 heard, RRR. No JVD, murmurs, rubs, gallops or clicks. No pedal edema. Gastrointestinal system: Abdomen is nondistended, soft and nontender. No organomegaly or masses felt. Normal bowel sounds heard. Central nervous system: Alert and oriented. No focal neurological deficits. Extremities: Symmetric 5 x 5 power. Skin: Gangrenous changes noted to fourth and fifth toes, open wounds on left foot, mild erythema of left foot.  Foul-smelling discharge. Psychiatry: Judgement and insight appear normal. Mood & affect appropriate.     Data Reviewed: I have personally reviewed following labs and imaging studies  CBC:  Recent Labs Lab 07/17/17 1216 07/18/17 0507  WBC 11.3* 9.8  NEUTROABS 9.4*  --   HGB 8.9* 7.6*  HCT 28.6* 24.7*  MCV 78.6 79.7  PLT 426* 540   Basic Metabolic Panel:  Recent Labs Lab 07/17/17 1216 07/18/17 0507  NA 133* 136  K 4.4 4.3  CL 99* 104  CO2 24 24  GLUCOSE 310* 166*  BUN 35* 29*  CREATININE 1.14* 1.01*  CALCIUM 8.8* 8.4*   GFR: Estimated Creatinine Clearance: 75.2 mL/min (A) (by C-G formula based on SCr of 1.01 mg/dL (H)). Liver Function Tests:  Recent Labs Lab 07/17/17 1216  AST 36  ALT 42  ALKPHOS 313*  BILITOT 0.7  PROT 7.2  ALBUMIN 2.7*   No results for input(s): LIPASE, AMYLASE in the last 168 hours. No results for input(s): AMMONIA in the last 168 hours. Coagulation  Profile: No results for input(s): INR, PROTIME in the last 168 hours. Cardiac Enzymes: No results for input(s): CKTOTAL, CKMB, CKMBINDEX, TROPONINI in the last 168 hours. BNP (last 3 results) No results for input(s): PROBNP in the last 8760 hours. HbA1C: No results for input(s): HGBA1C in the last 72 hours. CBG:  Recent Labs Lab 07/17/17 1141 07/17/17 1719 07/17/17 2140 07/18/17 0740 07/18/17 1143  GLUCAP 288* 322* 201* 194* 161*   Lipid Profile: No results for input(s): CHOL, HDL, LDLCALC, TRIG, CHOLHDL, LDLDIRECT in the last 72 hours. Thyroid Function Tests: No results for input(s): TSH, T4TOTAL, FREET4, T3FREE, THYROIDAB in the last 72 hours. Anemia Panel: No results for input(s): VITAMINB12, FOLATE, FERRITIN, TIBC, IRON, RETICCTPCT in the last 72 hours. Sepsis Labs: No results for input(s): PROCALCITON, LATICACIDVEN in the last 168 hours.  Recent Results (from the past 240 hour(s))  Blood culture (routine x 2)     Status: None (Preliminary result)   Collection Time: 07/17/17  2:39 PM  Result Value Ref Range Status   Specimen Description RIGHT ANTECUBITAL  Final   Special Requests   Final    BOTTLES DRAWN AEROBIC AND ANAEROBIC Blood Culture adequate volume   Culture NO GROWTH < 24 HOURS  Final   Report Status PENDING  Incomplete  Blood culture (routine x 2)     Status: None (Preliminary result)   Collection Time: 07/17/17  2:39 PM  Result Value Ref Range Status   Specimen Description LEFT ANTECUBITAL  Final   Special Requests   Final    BOTTLES DRAWN AEROBIC ONLY Blood Culture adequate volume   Culture NO GROWTH < 24 HOURS  Final   Report Status PENDING  Incomplete         Radiology Studies: US Venous Img Lower Bilateral  Result Date: 07/17/2017 CLINICAL DATA:  51 year old female with a history of swelling EXAM: BILATERAL LOWER EXTREMITY VENOUS DOPPLER ULTRASOUND TECHNIQUE: Gray-scale sonography with graded compression, as well as color Doppler and duplex  ultrasound were performed to evaluate the lower extremity deep venous systems from the level of the common femoral vein and including the common femoral, femoral, profunda femoral, popliteal and calf veins including the posterior tibial, peroneal and gastrocnemius veins when visible. The superficial great saphenous vein was also interrogated. Spectral Doppler was utilized to evaluate flow at rest and with distal augmentation maneuvers in the common femoral, femoral and popliteal veins. COMPARISON:  None. FINDINGS: RIGHT LOWER EXTREMITY Common Femoral Vein: No evidence of thrombus. Normal compressibility, respiratory phasicity and response to augmentation. Saphenofemoral Junction: No evidence of thrombus. Normal compressibility and flow on color Doppler imaging. Profunda Femoral Vein: No evidence of thrombus. Normal compressibility and flow on color Doppler imaging.  Femoral Vein: No evidence of thrombus. Normal compressibility, respiratory phasicity and response to augmentation. Popliteal Vein: No evidence of thrombus. Normal compressibility, respiratory phasicity and response to augmentation. Calf Veins: No evidence of thrombus. Normal compressibility and flow on color Doppler imaging. Superficial Great Saphenous Vein: No evidence of thrombus. Normal compressibility and flow on color Doppler imaging. Other Findings:  Edema LEFT LOWER EXTREMITY Common Femoral Vein: No evidence of thrombus. Normal compressibility, respiratory phasicity and response to augmentation. Saphenofemoral Junction: No evidence of thrombus. Normal compressibility and flow on color Doppler imaging. Profunda Femoral Vein: No evidence of thrombus. Normal compressibility and flow on color Doppler imaging. Femoral Vein: No evidence of thrombus. Normal compressibility, respiratory phasicity and response to augmentation. Popliteal Vein: No evidence of thrombus. Normal compressibility, respiratory phasicity and response to augmentation. Calf Veins: No  evidence of thrombus. Normal compressibility and flow on color Doppler imaging. Superficial Great Saphenous Vein: No evidence of thrombus. Normal compressibility and flow on color Doppler imaging. Other Findings:  Edema IMPRESSION: Sonographic survey of the bilateral lower extremities negative for DVT. Edema of the lower extremities. Electronically Signed   By: Corrie Mckusick D.O.   On: 07/17/2017 15:13   Dg Foot Complete Left  Result Date: 07/17/2017 CLINICAL DATA:  Left foot pain, swelling and redness for 3 days. EXAM: LEFT FOOT - COMPLETE 3+ VIEW COMPARISON:  04/07/2017 FINDINGS: Irregularity, deformity and osteolysis at the third, fourth and fifth MTP joints noted compatible with acute osteomyelitis. Fragmentation of the proximal aspect of the third toe proximal phalanx noted. Deformity of the second third and fourth metatarsal heads noted. There is been amputation of the second toe. Diffuse soft tissue swelling with gas noted. IMPRESSION: Findings compatible with acute osteomyelitis at the third, fourth and fifth MTP joints. Deformity and fragmentation as described. Irregularity of the distal second metatarsal which could be related to chronic/amputation changes. Diffuse soft tissue swelling with gas. Electronically Signed   By: Margarette Canada M.D.   On: 07/17/2017 15:28        Scheduled Meds: . hydrALAZINE  50 mg Oral TID  . insulin aspart  0-9 Units Subcutaneous TID WC  . sodium chloride flush  3 mL Intravenous Q12H   Continuous Infusions: . sodium chloride 250 mL (07/18/17 1110)  . piperacillin-tazobactam (ZOSYN)  IV Stopped (07/18/17 1455)  . vancomycin 1,250 mg (07/18/17 0527)     LOS: 1 day    Time spent: 58mins    Wayne Brunker, MD [facility] Pager [phone]  If 7PM-7AM, please contact night-coverage [email or web address] Password [facility] 07/18/2017, 3:18 PM

## 2017-07-18 NOTE — Progress Notes (Signed)
Subjective: Patient admitted yesterday for IV antibiotics and control of cellulitis of left foot.  Objective: Vital signs in last 24 hours: Temp:  [98.1 F (36.7 C)-98.8 F (37.1 C)] 98.6 F (37 C) (11/02 0710) Pulse Rate:  [70-92] 72 (11/02 0710) Resp:  [17-20] 20 (11/02 0710) BP: (141-173)/(52-80) 159/52 (11/02 0710) SpO2:  [92 %-100 %] 92 % (11/02 0710) Weight:  [209 lb (94.8 kg)] 209 lb (94.8 kg) (11/01 1709) Last BM Date: 07/16/17  Intake/Output from previous day: 11/01 0701 - 11/02 0700 In: 990 [P.O.:240; IV Piggyback:750] Out: -  Intake/Output this shift: No intake/output data recorded.  General appearance: alert, cooperative and no distress Extremities: Poorly healing wound, status post left second toe amputation.  Gangrenous changes noted on fourth and fifth toes.  Mild erythema of left foot noted with mild edema.  Pulses present.  Lab Results:   Recent Labs  07/17/17 1216 07/18/17 0507  WBC 11.3* 9.8  HGB 8.9* 7.6*  HCT 28.6* 24.7*  PLT 426* 376   BMET  Recent Labs  07/17/17 1216 07/18/17 0507  NA 133* 136  K 4.4 4.3  CL 99* 104  CO2 24 24  GLUCOSE 310* 166*  BUN 35* 29*  CREATININE 1.14* 1.01*  CALCIUM 8.8* 8.4*   PT/INR No results for input(s): LABPROT, INR in the last 72 hours.  Studies/Results: US Venous Img Lower Bilateral  Result Date: 07/17/2017 CLINICAL DATA:  51 year old female with a history of swelling EXAM: BILATERAL LOWER EXTREMITY VENOUS DOPPLER ULTRASOUND TECHNIQUE: Gray-scale sonography with graded compression, as well as color Doppler and duplex ultrasound were performed to evaluate the lower extremity deep venous systems from the level of the common femoral vein and including the common femoral, femoral, profunda femoral, popliteal and calf veins including the posterior tibial, peroneal and gastrocnemius veins when visible. The superficial great saphenous vein was also interrogated. Spectral Doppler was utilized to evaluate  flow at rest and with distal augmentation maneuvers in the common femoral, femoral and popliteal veins. COMPARISON:  None. FINDINGS: RIGHT LOWER EXTREMITY Common Femoral Vein: No evidence of thrombus. Normal compressibility, respiratory phasicity and response to augmentation. Saphenofemoral Junction: No evidence of thrombus. Normal compressibility and flow on color Doppler imaging. Profunda Femoral Vein: No evidence of thrombus. Normal compressibility and flow on color Doppler imaging. Femoral Vein: No evidence of thrombus. Normal compressibility, respiratory phasicity and response to augmentation. Popliteal Vein: No evidence of thrombus. Normal compressibility, respiratory phasicity and response to augmentation. Calf Veins: No evidence of thrombus. Normal compressibility and flow on color Doppler imaging. Superficial Great Saphenous Vein: No evidence of thrombus. Normal compressibility and flow on color Doppler imaging. Other Findings:  Edema LEFT LOWER EXTREMITY Common Femoral Vein: No evidence of thrombus. Normal compressibility, respiratory phasicity and response to augmentation. Saphenofemoral Junction: No evidence of thrombus. Normal compressibility and flow on color Doppler imaging. Profunda Femoral Vein: No evidence of thrombus. Normal compressibility and flow on color Doppler imaging. Femoral Vein: No evidence of thrombus. Normal compressibility, respiratory phasicity and response to augmentation. Popliteal Vein: No evidence of thrombus. Normal compressibility, respiratory phasicity and response to augmentation. Calf Veins: No evidence of thrombus. Normal compressibility and flow on color Doppler imaging. Superficial Great Saphenous Vein: No evidence of thrombus. Normal compressibility and flow on color Doppler imaging. Other Findings:  Edema IMPRESSION: Sonographic survey of the bilateral lower extremities negative for DVT. Edema of the lower extremities. Electronically Signed   By: Corrie Mckusick D.O.    On: 07/17/2017 15:13   Dg Foot  Complete Left  Result Date: 07/17/2017 CLINICAL DATA:  Left foot pain, swelling and redness for 3 days. EXAM: LEFT FOOT - COMPLETE 3+ VIEW COMPARISON:  04/07/2017 FINDINGS: Irregularity, deformity and osteolysis at the third, fourth and fifth MTP joints noted compatible with acute osteomyelitis. Fragmentation of the proximal aspect of the third toe proximal phalanx noted. Deformity of the second third and fourth metatarsal heads noted. There is been amputation of the second toe. Diffuse soft tissue swelling with gas noted. IMPRESSION: Findings compatible with acute osteomyelitis at the third, fourth and fifth MTP joints. Deformity and fragmentation as described. Irregularity of the distal second metatarsal which could be related to chronic/amputation changes. Diffuse soft tissue swelling with gas. Electronically Signed   By: Margarette Canada M.D.   On: 07/17/2017 15:28    Anti-infectives: Anti-infectives    Start     Dose/Rate Route Frequency Ordered Stop   07/18/17 0600  vancomycin (VANCOCIN) 1,250 mg in sodium chloride 0.9 % 250 mL IVPB     1,250 mg 166.7 mL/hr over 90 Minutes Intravenous Every 12 hours 07/17/17 1641     07/17/17 1800  piperacillin-tazobactam (ZOSYN) IVPB 3.375 g     3.375 g 12.5 mL/hr over 240 Minutes Intravenous Every 8 hours 07/17/17 1639     07/17/17 1800  vancomycin (VANCOCIN) 1,500 mg in sodium chloride 0.9 % 500 mL IVPB     1,500 mg 250 mL/hr over 120 Minutes Intravenous  Once 07/17/17 1640 07/17/17 1938   07/17/17 1545  ciprofloxacin (CIPRO) IVPB 400 mg    Comments:  osteomyleitis   400 mg 200 mL/hr over 60 Minutes Intravenous  Once 07/17/17 1536 07/17/17 1708   07/17/17 1430  ceFAZolin (ANCEF) IVPB 1 g/50 mL premix     1 g 100 mL/hr over 30 Minutes Intravenous  Once 07/17/17 1427 07/17/17 1608      Assessment/Plan: Impression: Nonhealing left foot secondary to diabetes mellitus, left foot with cellulitis and gangrenous changes of  multiple toes Plan: Continue IV antibiotics.  DVT workup negative.  Temporarily have scheduled left transmetatarsal amputation of the foot on 07/21/2017.  LOS: 1 day    Aviva Signs 07/18/2017

## 2017-07-19 LAB — GLUCOSE, CAPILLARY
GLUCOSE-CAPILLARY: 153 mg/dL — AB (ref 65–99)
GLUCOSE-CAPILLARY: 217 mg/dL — AB (ref 65–99)
GLUCOSE-CAPILLARY: 141 mg/dL — AB (ref 65–99)
Glucose-Capillary: 193 mg/dL — ABNORMAL HIGH (ref 65–99)

## 2017-07-19 LAB — CBC
HCT: 25.2 % — ABNORMAL LOW (ref 36.0–46.0)
HEMOGLOBIN: 7.8 g/dL — AB (ref 12.0–15.0)
MCH: 24.8 pg — AB (ref 26.0–34.0)
MCHC: 31 g/dL (ref 30.0–36.0)
MCV: 80.3 fL (ref 78.0–100.0)
Platelets: 386 10*3/uL (ref 150–400)
RBC: 3.14 MIL/uL — AB (ref 3.87–5.11)
RDW: 16.9 % — ABNORMAL HIGH (ref 11.5–15.5)
WBC: 9.3 10*3/uL (ref 4.0–10.5)

## 2017-07-19 LAB — PREPARE RBC (CROSSMATCH)

## 2017-07-19 LAB — BASIC METABOLIC PANEL
Anion gap: 8 (ref 5–15)
BUN: 20 mg/dL (ref 6–20)
CHLORIDE: 105 mmol/L (ref 101–111)
CO2: 25 mmol/L (ref 22–32)
Calcium: 8.1 mg/dL — ABNORMAL LOW (ref 8.9–10.3)
Creatinine, Ser: 0.81 mg/dL (ref 0.44–1.00)
GFR calc Af Amer: >60 mL/min (ref >60)
GFR calc non Af Amer: >60 mL/min (ref >60)
Glucose, Bld: 139 mg/dL — ABNORMAL HIGH (ref 65–99)
POTASSIUM: 4 mmol/L (ref 3.5–5.1)
SODIUM: 138 mmol/L (ref 135–145)

## 2017-07-19 LAB — SURGICAL PCR SCREEN
MRSA, PCR: NEGATIVE
STAPHYLOCOCCUS AUREUS: NEGATIVE

## 2017-07-19 LAB — ABO/RH: ABO/RH(D): O POS

## 2017-07-19 LAB — HEMOGLOBIN A1C
HEMOGLOBIN A1C: 7.1 % — AB (ref 4.8–5.6)
Mean Plasma Glucose: 157.07 mg/dL

## 2017-07-19 MED ORDER — SODIUM CHLORIDE 0.9 % IV SOLN
Freq: Once | INTRAVENOUS | Status: AC
  Administered 2017-07-19: 09:00:00 via INTRAVENOUS

## 2017-07-19 NOTE — Progress Notes (Signed)
PROGRESS NOTE    Natalie Contreras  CWC:[id number] DOB: [birth year] DOA: 07/17/2017 PCP: [name], MD    Brief Narrative:  51 year old female with a history of hypertension, diabetes, admitted to the hospital with swelling/redness of her left foot and foul odor coming from the left foot wound.  She was found to have evidence of osteomyelitis on plain films done in the emergency room.  She is admitted for IV antibiotics and further surgical management.   Assessment & Plan:   Principal Problem:   Cellulitis and abscess of foot Active Problems:   Diabetes mellitus with neuropathy (HCC)   Pulmonary hypertension (HCC)   PAD (peripheral artery disease) (HCC)   Essential hypertension   Chronic diastolic CHF (congestive heart failure) (HCC)   Toe amputation status, left ([place])   1. 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  She is on intravenous antibiotics.  General surgery is following.  She will likely need transmetatarsal amputation.  Continue current treatments. 2. Diabetes.  On sliding scale insulin.  Oral medications on hold.  Blood sugars are stable. 3. Chronic diastolic CHF.  Appears compensated.  Diuretics currently on hold. 4. Anemia.  Suspect is related to chronic disease.  Likely related to her chronic foot infection.  No obvious signs of bleeding.  Transfuse for hemoglobin less than 7.  Anemia panel indicates iron deficiency as well as chronic disease. 5. Hypertension.  Blood pressure stable.  Continue on hydralazine   DVT prophylaxis: Lovenox Code Status: Full code Family Communication: No family present Disposition Plan: Discharge home once improved   Consultants:   General surgery  Procedures:     Antimicrobials:   Vancomycin 11/1 >  Zosyn 11/1 >   Subjective: No pain in foot.  Feels that overall erythema and foot has gotten better.  Objective: Vitals:   07/18/17 0710 07/18/17 1400 07/18/17 2222 07/19/17 0433  BP: (!) 159/52 139/78 (!)  163/59 (!) 155/63  Pulse: 72 77 72 72  Resp: 20 19 19 16   Temp: 98.6 F (37 C) 98.3 F (36.8 C) 98.2 F (36.8 C) 98 F (36.7 C)  TempSrc: Oral Oral Oral Oral  SpO2: 92% 94% 96% 95%  Weight:      Height:        Intake/Output Summary (Last 24 hours) at 07/19/17 1631 Last data filed at 07/19/17 0322  Gross per 24 hour  Intake             1642 ml  Output              400 ml  Net             1242 ml   Filed Weights   07/17/17 1138 07/17/17 1709  Weight: 94.8 kg (209 lb) 94.8 kg (209 lb)    Examination:  General exam: Appears calm and comfortable  Respiratory system: Clear to auscultation. Respiratory effort normal. Cardiovascular system: S1 & S2 heard, RRR. No JVD, murmurs, rubs, gallops or clicks. No pedal edema. Gastrointestinal system: Abdomen is nondistended, soft and nontender. No organomegaly or masses felt. Normal bowel sounds heard. Central nervous system: Alert and oriented. No focal neurological deficits. Extremities: Symmetric 5 x 5 power. Skin: Gangrenous changes noted to fourth and fifth toes, open wounds on left foot, mild erythema of left foot.  Foul-smelling discharge. Psychiatry: Judgement and insight appear normal. Mood & affect appropriate.     Data Reviewed: I have personally reviewed following labs and imaging studies  CBC:  Recent Labs Lab 07/17/17 1216  07/18/17 0507 07/19/17 0633  WBC 11.3* 9.8 9.3  NEUTROABS 9.4*  --   --   HGB 8.9* 7.6* 7.8*  HCT 28.6* 24.7* 25.2*  MCV 78.6 79.7 80.3  PLT 426* 376 161   Basic Metabolic Panel:  Recent Labs Lab 07/17/17 1216 07/18/17 0507 07/19/17 0633  NA 133* 136 138  K 4.4 4.3 4.0  CL 99* 104 105  CO2 24 24 25   GLUCOSE 310* 166* 139*  BUN 35* 29* 20  CREATININE 1.14* 1.01* 0.81  CALCIUM 8.8* 8.4* 8.1*   GFR: Estimated Creatinine Clearance: 93.8 mL/min (by C-G formula based on SCr of 0.81 mg/dL). Liver Function Tests:  Recent Labs Lab 07/17/17 1216  AST 36  ALT 42  ALKPHOS 313*    BILITOT 0.7  PROT 7.2  ALBUMIN 2.7*   No results for input(s): LIPASE, AMYLASE in the last 168 hours. No results for input(s): AMMONIA in the last 168 hours. Coagulation Profile: No results for input(s): INR, PROTIME in the last 168 hours. Cardiac Enzymes: No results for input(s): CKTOTAL, CKMB, CKMBINDEX, TROPONINI in the last 168 hours. BNP (last 3 results) No results for input(s): PROBNP in the last 8760 hours. HbA1C:  Recent Labs  07/19/17 0633  HGBA1C 7.1*   CBG:  Recent Labs Lab 07/18/17 1745 07/18/17 2221 07/19/17 0738 07/19/17 1147 07/19/17 1624  GLUCAP 191* 124* 153* 217* 141*   Lipid Profile: No results for input(s): CHOL, HDL, LDLCALC, TRIG, CHOLHDL, LDLDIRECT in the last 72 hours. Thyroid Function Tests: No results for input(s): TSH, T4TOTAL, FREET4, T3FREE, THYROIDAB in the last 72 hours. Anemia Panel:  Recent Labs  07/18/17 1617  VITAMINB12 796  FOLATE 11.1  FERRITIN 347*  TIBC 211*  IRON 21*  RETICCTPCT 2.7   Sepsis Labs: No results for input(s): PROCALCITON, LATICACIDVEN in the last 168 hours.  Recent Results (from the past 240 hour(s))  Blood culture (routine x 2)     Status: None (Preliminary result)   Collection Time: 07/17/17  2:39 PM  Result Value Ref Range Status   Specimen Description RIGHT ANTECUBITAL  Final   Special Requests   Final    BOTTLES DRAWN AEROBIC AND ANAEROBIC Blood Culture adequate volume   Culture NO GROWTH 2 DAYS  Final   Report Status PENDING  Incomplete  Blood culture (routine x 2)     Status: None (Preliminary result)   Collection Time: 07/17/17  2:39 PM  Result Value Ref Range Status   Specimen Description LEFT ANTECUBITAL  Final   Special Requests   Final    BOTTLES DRAWN AEROBIC ONLY Blood Culture adequate volume   Culture NO GROWTH 2 DAYS  Final   Report Status PENDING  Incomplete  Surgical pcr screen     Status: None   Collection Time: 07/18/17  8:23 PM  Result Value Ref Range Status   MRSA, PCR  NEGATIVE NEGATIVE Final   Staphylococcus aureus NEGATIVE NEGATIVE Final    Comment: (NOTE) The Xpert SA Assay (FDA approved for NASAL specimens in patients 39 years of age and older), is one component of a comprehensive surveillance program. It is not intended to diagnose infection nor to guide or monitor treatment.          Radiology Studies: No results found.      Scheduled Meds: . enoxaparin (LOVENOX) injection  40 mg Subcutaneous Q24H  . hydrALAZINE  50 mg Oral TID  . insulin aspart  0-15 Units Subcutaneous TID WC  . insulin aspart  0-5  Units Subcutaneous QHS  . sodium chloride flush  3 mL Intravenous Q12H   Continuous Infusions: . sodium chloride 250 mL (07/18/17 1110)  . piperacillin-tazobactam (ZOSYN)  IV Stopped (07/19/17 1519)  . vancomycin Stopped (07/19/17 0827)     LOS: 2 days    Time spent: 30mins    Kapena Hamme, MD [facility] Pager [phone]  If 7PM-7AM, please contact night-coverage [email or web address] Password [email or web address] 07/19/2017, 4:31 PM

## 2017-07-19 NOTE — Progress Notes (Signed)
Subjective: Patient denies any foot pain.  Objective: Vital signs in last 24 hours: Temp:  [98 F (36.7 C)-98.3 F (36.8 C)] 98 F (36.7 C) (11/03 0433) Pulse Rate:  [72-77] 72 (11/03 0433) Resp:  [16-19] 16 (11/03 0433) BP: (139-163)/(59-78) 155/63 (11/03 0433) SpO2:  [94 %-96 %] 95 % (11/03 0433) Last BM Date: 07/18/17  Intake/Output from previous day: 11/02 0701 - 11/03 0700 In: 2482 [P.O.:1620; I.V.:162; IV Piggyback:700] Out: 1900 [Urine:1900] Intake/Output this shift: No intake/output data recorded.  General appearance: alert, cooperative and no distress Extremities: Gangrenous changes noted in fourth and fifth toes, poorly healing previous amputation site.  Mild cyanotic changes in the left foot, pulses intact.  Lab Results:   Recent Labs  07/18/17 0507 07/19/17 0633  WBC 9.8 9.3  HGB 7.6* 7.8*  HCT 24.7* 25.2*  PLT 376 386   BMET  Recent Labs  07/18/17 0507 07/19/17 0633  NA 136 138  K 4.3 4.0  CL 104 105  CO2 24 25  GLUCOSE 166* 139*  BUN 29* 20  CREATININE 1.01* 0.81  CALCIUM 8.4* 8.1*   PT/INR No results for input(s): LABPROT, INR in the last 72 hours.  Studies/Results: US Venous Img Lower Bilateral  Result Date: 07/17/2017 CLINICAL DATA:  51 year old female with a history of swelling EXAM: BILATERAL LOWER EXTREMITY VENOUS DOPPLER ULTRASOUND TECHNIQUE: Gray-scale sonography with graded compression, as well as color Doppler and duplex ultrasound were performed to evaluate the lower extremity deep venous systems from the level of the common femoral vein and including the common femoral, femoral, profunda femoral, popliteal and calf veins including the posterior tibial, peroneal and gastrocnemius veins when visible. The superficial great saphenous vein was also interrogated. Spectral Doppler was utilized to evaluate flow at rest and with distal augmentation maneuvers in the common femoral, femoral and popliteal veins. COMPARISON:  None. FINDINGS:  RIGHT LOWER EXTREMITY Common Femoral Vein: No evidence of thrombus. Normal compressibility, respiratory phasicity and response to augmentation. Saphenofemoral Junction: No evidence of thrombus. Normal compressibility and flow on color Doppler imaging. Profunda Femoral Vein: No evidence of thrombus. Normal compressibility and flow on color Doppler imaging. Femoral Vein: No evidence of thrombus. Normal compressibility, respiratory phasicity and response to augmentation. Popliteal Vein: No evidence of thrombus. Normal compressibility, respiratory phasicity and response to augmentation. Calf Veins: No evidence of thrombus. Normal compressibility and flow on color Doppler imaging. Superficial Great Saphenous Vein: No evidence of thrombus. Normal compressibility and flow on color Doppler imaging. Other Findings:  Edema LEFT LOWER EXTREMITY Common Femoral Vein: No evidence of thrombus. Normal compressibility, respiratory phasicity and response to augmentation. Saphenofemoral Junction: No evidence of thrombus. Normal compressibility and flow on color Doppler imaging. Profunda Femoral Vein: No evidence of thrombus. Normal compressibility and flow on color Doppler imaging. Femoral Vein: No evidence of thrombus. Normal compressibility, respiratory phasicity and response to augmentation. Popliteal Vein: No evidence of thrombus. Normal compressibility, respiratory phasicity and response to augmentation. Calf Veins: No evidence of thrombus. Normal compressibility and flow on color Doppler imaging. Superficial Great Saphenous Vein: No evidence of thrombus. Normal compressibility and flow on color Doppler imaging. Other Findings:  Edema IMPRESSION: Sonographic survey of the bilateral lower extremities negative for DVT. Edema of the lower extremities. Electronically Signed   By: Corrie Mckusick D.O.   On: 07/17/2017 15:13   Dg Foot Complete Left  Result Date: 07/17/2017 CLINICAL DATA:  Left foot pain, swelling and redness for 3  days. EXAM: LEFT FOOT - COMPLETE 3+ VIEW COMPARISON:  04/07/2017 FINDINGS: Irregularity, deformity and osteolysis at the third, fourth and fifth MTP joints noted compatible with acute osteomyelitis. Fragmentation of the proximal aspect of the third toe proximal phalanx noted. Deformity of the second third and fourth metatarsal heads noted. There is been amputation of the second toe. Diffuse soft tissue swelling with gas noted. IMPRESSION: Findings compatible with acute osteomyelitis at the third, fourth and fifth MTP joints. Deformity and fragmentation as described. Irregularity of the distal second metatarsal which could be related to chronic/amputation changes. Diffuse soft tissue swelling with gas. Electronically Signed   By: Margarette Canada M.D.   On: 07/17/2017 15:28    Anti-infectives: Anti-infectives    Start     Dose/Rate Route Frequency Ordered Stop   07/18/17 0600  vancomycin (VANCOCIN) 1,250 mg in sodium chloride 0.9 % 250 mL IVPB     1,250 mg 166.7 mL/hr over 90 Minutes Intravenous Every 12 hours 07/17/17 1641     07/17/17 1800  piperacillin-tazobactam (ZOSYN) IVPB 3.375 g     3.375 g 12.5 mL/hr over 240 Minutes Intravenous Every 8 hours 07/17/17 1639     07/17/17 1800  vancomycin (VANCOCIN) 1,500 mg in sodium chloride 0.9 % 500 mL IVPB     1,500 mg 250 mL/hr over 120 Minutes Intravenous  Once 07/17/17 1640 07/17/17 1938   07/17/17 1545  ciprofloxacin (CIPRO) IVPB 400 mg    Comments:  osteomyleitis   400 mg 200 mL/hr over 60 Minutes Intravenous  Once 07/17/17 1536 07/17/17 1708   07/17/17 1430  ceFAZolin (ANCEF) IVPB 1 g/50 mL premix     1 g 100 mL/hr over 30 Minutes Intravenous  Once 07/17/17 1427 07/17/17 1608      Assessment/Plan: Impression: Gangrene of left foot.  Anemia of chronic disease. Plan: We will check hematocrit in a.m.  Will type and cross for 2 units.  May need transfusion prior to surgery.  Continue current IV antibiotics.  LOS: 2 days    Aviva Signs 07/19/2017

## 2017-07-20 LAB — GLUCOSE, CAPILLARY
GLUCOSE-CAPILLARY: 259 mg/dL — AB (ref 65–99)
GLUCOSE-CAPILLARY: 247 mg/dL — AB (ref 65–99)
GLUCOSE-CAPILLARY: 207 mg/dL — AB (ref 65–99)
Glucose-Capillary: 173 mg/dL — ABNORMAL HIGH (ref 65–99)

## 2017-07-20 LAB — CBC
HEMATOCRIT: 25.9 % — AB (ref 36.0–46.0)
HEMOGLOBIN: 7.8 g/dL — AB (ref 12.0–15.0)
MCH: 24.4 pg — AB (ref 26.0–34.0)
MCHC: 30.1 g/dL (ref 30.0–36.0)
MCV: 80.9 fL (ref 78.0–100.0)
PLATELETS: 396 10*3/uL (ref 150–400)
RBC: 3.2 MIL/uL — AB (ref 3.87–5.11)
RDW: 17 % — ABNORMAL HIGH (ref 11.5–15.5)
WBC: 8.9 10*3/uL (ref 4.0–10.5)

## 2017-07-20 LAB — BASIC METABOLIC PANEL
ANION GAP: 9 (ref 5–15)
BUN: 14 mg/dL (ref 6–20)
CHLORIDE: 103 mmol/L (ref 101–111)
CO2: 25 mmol/L (ref 22–32)
Calcium: 8.1 mg/dL — ABNORMAL LOW (ref 8.9–10.3)
Creatinine, Ser: 0.86 mg/dL (ref 0.44–1.00)
GFR calc non Af Amer: >60 mL/min (ref >60)
Glucose, Bld: 156 mg/dL — ABNORMAL HIGH (ref 65–99)
POTASSIUM: 4.2 mmol/L (ref 3.5–5.1)
SODIUM: 137 mmol/L (ref 135–145)

## 2017-07-20 LAB — VANCOMYCIN, TROUGH: Vancomycin Tr: 33 ug/mL (ref 15–20)

## 2017-07-20 MED ORDER — VANCOMYCIN HCL 10 G IV SOLR
1250.0000 mg | INTRAVENOUS | Status: DC
  Administered 2017-07-20 – 2017-07-22 (×3): 1250 mg via INTRAVENOUS
  Filled 2017-07-20 (×4): qty 1250

## 2017-07-20 MED ORDER — CHLORHEXIDINE GLUCONATE CLOTH 2 % EX PADS: 6.0000 | MEDICATED_PAD | Freq: Once | CUTANEOUS | Status: AC

## 2017-07-20 MED ORDER — CHLORHEXIDINE GLUCONATE CLOTH 2 % EX PADS
6.0000 | MEDICATED_PAD | Freq: Once | CUTANEOUS | Status: AC
  Administered 2017-07-20: 6 via TOPICAL

## 2017-07-20 NOTE — Progress Notes (Signed)
Pharmacy Antibiotic Note  Natalie Contreras is a 51 y.o. female admitted on 07/17/2017 with Wound infection.  Pharmacy has been consulted for vancomycin and zosyn dosing. Vancomycin trough is elevated at 33 mcg/ml  Plan: Change vancomycin to 1250 mg IV q24 hours Cont zosyn  Height: 5\' 4"  (162.6 cm) Weight: 209 lb (94.8 kg) IBW/kg (Calculated) : 54.7  Temp (24hrs), Avg:97.9 F (36.6 C), Min:97.8 F (36.6 C), Max:97.9 F (36.6 C)  Recent Labs  Lab 07/17/17 1216 07/18/17 0507 07/19/17 0633 07/20/17 0627  WBC 11.3* 9.8 9.3 8.9  CREATININE 1.14* 1.01* 0.81 0.86  VANCOTROUGH  --   --   --  33*    Estimated Creatinine Clearance: 88.3 mL/min (by C-G formula based on SCr of 0.86 mg/dL).    No Known Allergies  Antimicrobials this admission: Vancomycin  11/1 >>  Zosyn  11/1 >>  Ancef 11/1 x 1 dose ED Cipro 11/1 x 1 dose ED    Thank you for allowing pharmacy to be a part of this patient's care.  Excell Seltzer [place]et 07/20/2017 8:30 AM

## 2017-07-20 NOTE — Progress Notes (Signed)
Natalie Kitchen  PROGRESS NOTE    TRACY Contreras  AYT:[id number] DOB: [birth year] DOA: 07/17/2017 PCP: [name], MD    Brief Narrative:  51 year old female with a history of hypertension, diabetes, admitted to the hospital with swelling/redness of her left foot and foul odor coming from the left foot wound.  She was found to have evidence of osteomyelitis on plain films done in the emergency room.  She is admitted for IV antibiotics and further surgical management.   Assessment & Plan:   Principal Problem:   Cellulitis and abscess of foot Active Problems:   Diabetes mellitus with neuropathy (HCC)   Pulmonary hypertension (HCC)   PAD (peripheral artery disease) (HCC)   Essential hypertension   Chronic diastolic CHF (congestive heart failure) (HCC)   Toe amputation status, left ([place])   1. 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  She is on intravenous antibiotics.  General surgery is following.  She will likely need transmetatarsal amputation.  Continue current treatments. 2. Diabetes.  On sliding scale insulin.  Oral medications on hold.  Blood sugars are stable. 3. Chronic diastolic CHF.  Appears compensated.  Diuretics currently on hold. 4. Anemia.  Suspect is related to chronic disease.  Likely related to her chronic foot infection.  No obvious signs of bleeding.  Transfuse for hemoglobin less than 7.  Anemia panel indicates iron deficiency as well as chronic disease. 5. Hypertension.  Blood pressure stable.  Continue on hydralazine   DVT prophylaxis: Lovenox Code Status: Full code Family Communication: No family present Disposition Plan: Discharge home once improved   Consultants:   General surgery  Procedures:     Antimicrobials:   Vancomycin 11/1 >  Zosyn 11/1 >   Subjective: Denies any pain in her left foot.  Feels that overall erythema is better.  Anxious about surgery.  Objective: Vitals:   07/19/17 1721 07/19/17 1828 07/19/17 2109 07/20/17 0626    BP: (!) 177/55 (!) 139/44 (!) 164/50 (!) 148/58  Pulse:   68 72  Resp:   18 18  Temp:   97.8 F (36.6 C) 97.9 F (36.6 C)  TempSrc:   Oral Oral  SpO2: 97%  99% 99%  Weight:      Height:       No intake or output data in the 24 hours ending 07/20/17 1346 Filed Weights   07/17/17 1138 07/17/17 1709  Weight: 94.8 kg (209 lb) 94.8 kg (209 lb)    Examination:  General exam: Appears calm and comfortable  Respiratory system: Clear to auscultation. Respiratory effort normal. Cardiovascular system: S1 & S2 heard, RRR. No JVD, murmurs, rubs, gallops or clicks. No pedal edema. Gastrointestinal system: Abdomen is nondistended, soft and nontender. No organomegaly or masses felt. Normal bowel sounds heard. Central nervous system: Alert and oriented. No focal neurological deficits. Extremities: Symmetric 5 x 5 power. Skin: Gangrenous changes noted to fourth and fifth toes, open wounds on left foot, mild erythema of left foot.  Foul-smelling discharge. Psychiatry: Judgement and insight appear normal. Mood & affect appropriate.     Data Reviewed: I have personally reviewed following labs and imaging studies  CBC: Recent Labs  Lab 07/17/17 1216 07/18/17 0507 07/19/17 0633 07/20/17 0627  WBC 11.3* 9.8 9.3 8.9  NEUTROABS 9.4*  --   --   --   HGB 8.9* 7.6* 7.8* 7.8*  HCT 28.6* 24.7* 25.2* 25.9*  MCV 78.6 79.7 80.3 80.9  PLT 426* 376 386 355   Basic Metabolic Panel: Recent Labs  Lab  07/17/17 1216 07/18/17 0507 07/19/17 0633 07/20/17 0627  NA 133* 136 138 137  K 4.4 4.3 4.0 4.2  CL 99* 104 105 103  CO2 24 24 25 25   GLUCOSE 310* 166* 139* 156*  BUN 35* 29* 20 14  CREATININE 1.14* 1.01* 0.81 0.86  CALCIUM 8.8* 8.4* 8.1* 8.1*   GFR: Estimated Creatinine Clearance: 88.3 mL/min (by C-G formula based on SCr of 0.86 mg/dL). Liver Function Tests: Recent Labs  Lab 07/17/17 1216  AST 36  ALT 42  ALKPHOS 313*  BILITOT 0.7  PROT 7.2  ALBUMIN 2.7*   No results for input(s):  LIPASE, AMYLASE in the last 168 hours. No results for input(s): AMMONIA in the last 168 hours. Coagulation Profile: No results for input(s): INR, PROTIME in the last 168 hours. Cardiac Enzymes: No results for input(s): CKTOTAL, CKMB, CKMBINDEX, TROPONINI in the last 168 hours. BNP (last 3 results) No results for input(s): PROBNP in the last 8760 hours. HbA1C: Recent Labs    07/19/17 0633  HGBA1C 7.1*   CBG: Recent Labs  Lab 07/19/17 1147 07/19/17 1624 07/19/17 2107 07/20/17 0754 07/20/17 1111  GLUCAP 217* 141* 193* 173* 259*   Lipid Profile: No results for input(s): CHOL, HDL, LDLCALC, TRIG, CHOLHDL, LDLDIRECT in the last 72 hours. Thyroid Function Tests: No results for input(s): TSH, T4TOTAL, FREET4, T3FREE, THYROIDAB in the last 72 hours. Anemia Panel: Recent Labs    07/18/17 1617  VITAMINB12 796  FOLATE 11.1  FERRITIN 347*  TIBC 211*  IRON 21*  RETICCTPCT 2.7   Sepsis Labs: No results for input(s): PROCALCITON, LATICACIDVEN in the last 168 hours.  Recent Results (from the past 240 hour(s))  Blood culture (routine x 2)     Status: None (Preliminary result)   Collection Time: 07/17/17  2:39 PM  Result Value Ref Range Status   Specimen Description RIGHT ANTECUBITAL  Final   Special Requests   Final    BOTTLES DRAWN AEROBIC AND ANAEROBIC Blood Culture adequate volume   Culture NO GROWTH 3 DAYS  Final   Report Status PENDING  Incomplete  Blood culture (routine x 2)     Status: None (Preliminary result)   Collection Time: 07/17/17  2:39 PM  Result Value Ref Range Status   Specimen Description LEFT ANTECUBITAL  Final   Special Requests   Final    BOTTLES DRAWN AEROBIC ONLY Blood Culture adequate volume   Culture NO GROWTH 3 DAYS  Final   Report Status PENDING  Incomplete  Surgical pcr screen     Status: None   Collection Time: 07/18/17  8:23 PM  Result Value Ref Range Status   MRSA, PCR NEGATIVE NEGATIVE Final   Staphylococcus aureus NEGATIVE NEGATIVE Final      Comment: (NOTE) The Xpert SA Assay (FDA approved for NASAL specimens in patients 71 years of age and older), is one component of a comprehensive surveillance program. It is not intended to diagnose infection nor to guide or monitor treatment.          Radiology Studies: No results found.      Scheduled Meds: . enoxaparin (LOVENOX) injection  40 mg Subcutaneous Q24H  . hydrALAZINE  50 mg Oral TID  . insulin aspart  0-15 Units Subcutaneous TID WC  . insulin aspart  0-5 Units Subcutaneous QHS  . sodium chloride flush  3 mL Intravenous Q12H   Continuous Infusions: . sodium chloride 250 mL (07/18/17 1110)  . piperacillin-tazobactam (ZOSYN)  IV Stopped (07/20/17 1329)  .  vancomycin       LOS: 3 days    Time spent: 13mins    MEMON,JEHANZEB, MD [facility] Pager [phone]  If 7PM-7AM, please contact night-coverage [email or web address] Password [email or web address] 07/20/2017, 1:46 PM

## 2017-07-20 NOTE — Progress Notes (Signed)
  Subjective: Patient denies any foot pain.  Objective: Vital signs in last 24 hours: Temp:  [97.8 F (36.6 C)-97.9 F (36.6 C)] 97.9 F (36.6 C) (11/04 0626) Pulse Rate:  [68-72] 72 (11/04 0626) Resp:  [18] 18 (11/04 0626) BP: (139-177)/(44-115) 148/58 (11/04 0626) SpO2:  [96 %-99 %] 99 % (11/04 0626) Last BM Date: 07/18/17  Intake/Output from previous day: No intake/output data recorded. Intake/Output this shift: No intake/output data recorded.  General appearance: alert, cooperative and no distress Extremities: Gangrene of multiple toes and left foot except for great toe.  Poorly healing previous amputation site.  Lab Results:  Recent Labs    07/19/17 0633 07/20/17 0627  WBC 9.3 8.9  HGB 7.8* 7.8*  HCT 25.2* 25.9*  PLT 386 396   BMET Recent Labs    07/19/17 0633 07/20/17 0627  NA 138 137  K 4.0 4.2  CL 105 103  CO2 25 25  GLUCOSE 139* 156*  BUN 20 14  CREATININE 0.81 0.86  CALCIUM 8.1* 8.1*   PT/INR No results for input(s): LABPROT, INR in the last 72 hours.  Studies/Results: No results found.  Anti-infectives: Anti-infectives (From admission, onward)   Start     Dose/Rate Route Frequency Ordered Stop   07/20/17 2200  vancomycin (VANCOCIN) 1,250 mg in sodium chloride 0.9 % 250 mL IVPB     1,250 mg 166.7 mL/hr over 90 Minutes Intravenous Every 24 hours 07/20/17 0830     07/18/17 0600  vancomycin (VANCOCIN) 1,250 mg in sodium chloride 0.9 % 250 mL IVPB  Status:  Discontinued     1,250 mg 166.7 mL/hr over 90 Minutes Intravenous Every 12 hours 07/17/17 1641 07/20/17 0826   07/17/17 1800  piperacillin-tazobactam (ZOSYN) IVPB 3.375 g     3.375 g 12.5 mL/hr over 240 Minutes Intravenous Every 8 hours 07/17/17 1639     07/17/17 1800  vancomycin (VANCOCIN) 1,500 mg in sodium chloride 0.9 % 500 mL IVPB     1,500 mg 250 mL/hr over 120 Minutes Intravenous  Once 07/17/17 1640 07/17/17 1938   07/17/17 1545  ciprofloxacin (CIPRO) IVPB 400 mg    Comments:   osteomyleitis   400 mg 200 mL/hr over 60 Minutes Intravenous  Once 07/17/17 1536 07/17/17 1708   07/17/17 1430  ceFAZolin (ANCEF) IVPB 1 g/50 mL premix     1 g 100 mL/hr over 30 Minutes Intravenous  Once 07/17/17 1427 07/17/17 1608      Assessment/Plan: Impression: Gangrene of multiple toes, left foot Plan: We will proceed with transmetatarsal amputation of left foot except for great toe.  The risks and benefits of the procedure including bleeding, infection, and the possibility of wound breakdown were fully explained to the patient, who gave informed consent.  Hopefully this will heal and we will be able to avoid a left below-knee amputation.  LOS: 3 days    Aviva Signs 07/20/2017

## 2017-07-20 NOTE — H&P (View-Only) (Signed)
  Subjective: Patient denies any foot pain.  Objective: Vital signs in last 24 hours: Temp:  [97.8 F (36.6 C)-97.9 F (36.6 C)] 97.9 F (36.6 C) (11/04 0626) Pulse Rate:  [68-72] 72 (11/04 0626) Resp:  [18] 18 (11/04 0626) BP: (139-177)/(44-115) 148/58 (11/04 0626) SpO2:  [96 %-99 %] 99 % (11/04 0626) Last BM Date: 07/18/17  Intake/Output from previous day: No intake/output data recorded. Intake/Output this shift: No intake/output data recorded.  General appearance: alert, cooperative and no distress Extremities: Gangrene of multiple toes and left foot except for great toe.  Poorly healing previous amputation site.  Lab Results:  Recent Labs    07/19/17 0633 07/20/17 0627  WBC 9.3 8.9  HGB 7.8* 7.8*  HCT 25.2* 25.9*  PLT 386 396   BMET Recent Labs    07/19/17 0633 07/20/17 0627  NA 138 137  K 4.0 4.2  CL 105 103  CO2 25 25  GLUCOSE 139* 156*  BUN 20 14  CREATININE 0.81 0.86  CALCIUM 8.1* 8.1*   PT/INR No results for input(s): LABPROT, INR in the last 72 hours.  Studies/Results: No results found.  Anti-infectives: Anti-infectives (From admission, onward)   Start     Dose/Rate Route Frequency Ordered Stop   07/20/17 2200  vancomycin (VANCOCIN) 1,250 mg in sodium chloride 0.9 % 250 mL IVPB     1,250 mg 166.7 mL/hr over 90 Minutes Intravenous Every 24 hours 07/20/17 0830     07/18/17 0600  vancomycin (VANCOCIN) 1,250 mg in sodium chloride 0.9 % 250 mL IVPB  Status:  Discontinued     1,250 mg 166.7 mL/hr over 90 Minutes Intravenous Every 12 hours 07/17/17 1641 07/20/17 0826   07/17/17 1800  piperacillin-tazobactam (ZOSYN) IVPB 3.375 g     3.375 g 12.5 mL/hr over 240 Minutes Intravenous Every 8 hours 07/17/17 1639     07/17/17 1800  vancomycin (VANCOCIN) 1,500 mg in sodium chloride 0.9 % 500 mL IVPB     1,500 mg 250 mL/hr over 120 Minutes Intravenous  Once 07/17/17 1640 07/17/17 1938   07/17/17 1545  ciprofloxacin (CIPRO) IVPB 400 mg    Comments:   osteomyleitis   400 mg 200 mL/hr over 60 Minutes Intravenous  Once 07/17/17 1536 07/17/17 1708   07/17/17 1430  ceFAZolin (ANCEF) IVPB 1 g/50 mL premix     1 g 100 mL/hr over 30 Minutes Intravenous  Once 07/17/17 1427 07/17/17 1608      Assessment/Plan: Impression: Gangrene of multiple toes, left foot Plan: We will proceed with transmetatarsal amputation of left foot except for great toe.  The risks and benefits of the procedure including bleeding, infection, and the possibility of wound breakdown were fully explained to the patient, who gave informed consent.  Hopefully this will heal and we will be able to avoid a left below-knee amputation.  LOS: 3 days    Aviva Signs 07/20/2017

## 2017-07-21 ENCOUNTER — Encounter (HOSPITAL_COMMUNITY): Payer: Self-pay

## 2017-07-21 ENCOUNTER — Inpatient Hospital Stay (HOSPITAL_COMMUNITY): Payer: BLUE CROSS/BLUE SHIELD | Admitting: Anesthesiology

## 2017-07-21 ENCOUNTER — Encounter (HOSPITAL_COMMUNITY)
Admission: EM | Disposition: A | Discharge status: Home Health | Payer: Self-pay | Source: Home / Self Care | Attending: Internal Medicine

## 2017-07-21 ENCOUNTER — Other Ambulatory Visit: Payer: Self-pay

## 2017-07-21 DIAGNOSIS — I96 Gangrene, not elsewhere classified: Secondary | ICD-10-CM

## 2017-07-21 HISTORY — PX: TRANSMETATARSAL AMPUTATION: SHX6197

## 2017-07-21 LAB — GLUCOSE, CAPILLARY
GLUCOSE-CAPILLARY: 113 mg/dL — AB (ref 65–99)
GLUCOSE-CAPILLARY: 224 mg/dL — AB (ref 65–99)
Glucose-Capillary: 184 mg/dL — ABNORMAL HIGH (ref 65–99)
Glucose-Capillary: 120 mg/dL — ABNORMAL HIGH (ref 65–99)
Glucose-Capillary: 129 mg/dL — ABNORMAL HIGH (ref 65–99)

## 2017-07-21 LAB — BASIC METABOLIC PANEL WITH GFR
Anion gap: 10 (ref 5–15)
BUN: 16 mg/dL (ref 6–20)
CO2: 24 mmol/L (ref 22–32)
Calcium: 8 mg/dL — ABNORMAL LOW (ref 8.9–10.3)
Chloride: 104 mmol/L (ref 101–111)
Creatinine, Ser: 0.8 mg/dL (ref 0.44–1.00)
GFR calc Af Amer: >60 mL/min
GFR calc non Af Amer: >60 mL/min
Glucose, Bld: 198 mg/dL — ABNORMAL HIGH (ref 65–99)
Potassium: 3.9 mmol/L (ref 3.5–5.1)
Sodium: 138 mmol/L (ref 135–145)

## 2017-07-21 SURGERY — AMPUTATION, FOOT, TRANSMETATARSAL
Anesthesia: General | Site: Foot | Laterality: Left

## 2017-07-21 MED ORDER — FENTANYL CITRATE (PF) 100 MCG/2ML IJ SOLN
INTRAMUSCULAR | Status: AC
  Filled 2017-07-21: qty 2

## 2017-07-21 MED ORDER — HYDROCODONE-ACETAMINOPHEN 5-325 MG PO TABS
1.0000 | ORAL_TABLET | ORAL | Status: DC | PRN
  Administered 2017-07-22: 1 via ORAL
  Filled 2017-07-21: qty 1

## 2017-07-21 MED ORDER — KETOROLAC TROMETHAMINE 30 MG/ML IJ SOLN
30.0000 mg | Freq: Once | INTRAMUSCULAR | Status: AC
  Administered 2017-07-21: 30 mg via INTRAVENOUS

## 2017-07-21 MED ORDER — FENTANYL CITRATE (PF) 100 MCG/2ML IJ SOLN
INTRAMUSCULAR | Status: DC | PRN
  Administered 2017-07-21 (×2): 50 ug via INTRAVENOUS

## 2017-07-21 MED ORDER — MIDAZOLAM HCL 2 MG/2ML IJ SOLN
INTRAMUSCULAR | Status: AC
  Filled 2017-07-21: qty 2

## 2017-07-21 MED ORDER — BUPIVACAINE HCL (PF) 0.5 % IJ SOLN
INTRAMUSCULAR | Status: AC
  Filled 2017-07-21: qty 30

## 2017-07-21 MED ORDER — SUCCINYLCHOLINE CHLORIDE 20 MG/ML IJ SOLN
INTRAMUSCULAR | Status: DC | PRN
  Administered 2017-07-21: 140 mg via INTRAVENOUS

## 2017-07-21 MED ORDER — ONDANSETRON 4 MG PO TBDP
4.0000 mg | ORAL_TABLET | Freq: Once | ORAL | Status: AC
  Administered 2017-07-21: 4 mg via ORAL

## 2017-07-21 MED ORDER — SODIUM CHLORIDE 0.9 % IV SOLN
INTRAVENOUS | Status: DC
  Administered 2017-07-21: 13:00:00 via INTRAVENOUS

## 2017-07-21 MED ORDER — PROPOFOL 10 MG/ML IV BOLUS
INTRAVENOUS | Status: AC
  Filled 2017-07-21: qty 20

## 2017-07-21 MED ORDER — SODIUM CHLORIDE 0.9 % IR SOLN
Status: DC | PRN
  Administered 2017-07-21: 1000 mL

## 2017-07-21 MED ORDER — ONDANSETRON 4 MG PO TBDP
ORAL_TABLET | ORAL | Status: AC
  Filled 2017-07-21: qty 1

## 2017-07-21 MED ORDER — KETOROLAC TROMETHAMINE 30 MG/ML IJ SOLN
INTRAMUSCULAR | Status: AC
  Filled 2017-07-21: qty 1

## 2017-07-21 MED ORDER — CARVEDILOL 3.125 MG PO TABS
6.2500 mg | ORAL_TABLET | Freq: Two times a day (BID) | ORAL | Status: DC
  Administered 2017-07-21 – 2017-07-23 (×4): 6.25 mg via ORAL
  Filled 2017-07-21 (×4): qty 2

## 2017-07-21 MED ORDER — HEMOSTATIC AGENTS (NO CHARGE) OPTIME
TOPICAL | Status: DC | PRN
  Administered 2017-07-21: 1 via TOPICAL

## 2017-07-21 MED ORDER — MIDAZOLAM HCL 2 MG/2ML IJ SOLN
1.0000 mg | Freq: Once | INTRAMUSCULAR | Status: AC | PRN
  Administered 2017-07-21: 2 mg via INTRAVENOUS

## 2017-07-21 MED ORDER — HYDROMORPHONE HCL 1 MG/ML IJ SOLN
1.0000 mg | INTRAMUSCULAR | Status: DC | PRN
  Administered 2017-07-21: 1 mg via INTRAVENOUS
  Filled 2017-07-21: qty 1

## 2017-07-21 MED ORDER — PROPOFOL 10 MG/ML IV BOLUS
INTRAVENOUS | Status: DC | PRN
  Administered 2017-07-21: 150 mg via INTRAVENOUS

## 2017-07-21 SURGICAL SUPPLY — 37 items
BAG HAMPER (MISCELLANEOUS) ×3 IMPLANT
BANDAGE ELASTIC 4 LF NS (GAUZE/BANDAGES/DRESSINGS) ×3 IMPLANT
BANDAGE ELASTIC 4 VELCRO NS (GAUZE/BANDAGES/DRESSINGS) ×3 IMPLANT
BLADE SAW RECIP 87.9 MT (BLADE) ×3 IMPLANT
BNDG GAUZE ELAST 4 BULKY (GAUZE/BANDAGES/DRESSINGS) ×3 IMPLANT
CLOTH BEACON ORANGE TIMEOUT ST (SAFETY) ×3 IMPLANT
COVER LIGHT HANDLE STERIS (MISCELLANEOUS) ×6 IMPLANT
DRSG XEROFORM 1X8 (GAUZE/BANDAGES/DRESSINGS) ×3 IMPLANT
ELECT REM PT RETURN 9FT ADLT (ELECTROSURGICAL) ×3
ELECTRODE REM PT RTRN 9FT ADLT (ELECTROSURGICAL) ×2 IMPLANT
FORMALIN 10 PREFIL 480ML (MISCELLANEOUS) ×3 IMPLANT
GAUZE SPONGE 4X4 12PLY STRL (GAUZE/BANDAGES/DRESSINGS) ×3 IMPLANT
GAUZE XEROFORM 5X9 LF (GAUZE/BANDAGES/DRESSINGS) ×3 IMPLANT
GLOVE BIO SURGEON STRL SZ 6.5 (GLOVE) ×3 IMPLANT
GLOVE BIOGEL PI IND STRL 6.5 (GLOVE) ×2 IMPLANT
GLOVE BIOGEL PI IND STRL 7.0 (GLOVE) ×4 IMPLANT
GLOVE BIOGEL PI INDICATOR 6.5 (GLOVE) ×1
GLOVE BIOGEL PI INDICATOR 7.0 (GLOVE) ×2
GLOVE ECLIPSE 6.5 STRL STRAW (GLOVE) ×3 IMPLANT
GLOVE SURG SS PI 7.5 STRL IVOR (GLOVE) ×3 IMPLANT
GOWN STRL REUS W/TWL LRG LVL3 (GOWN DISPOSABLE) ×9 IMPLANT
HEMOSTAT ARISTA ABSORB 1G (MISCELLANEOUS) ×3 IMPLANT
INST SET MINOR BONE (KITS) ×3 IMPLANT
KIT ROOM TURNOVER APOR (KITS) ×3 IMPLANT
MANIFOLD NEPTUNE II (INSTRUMENTS) ×3 IMPLANT
NEEDLE HYPO 25X1 1.5 SAFETY (NEEDLE) ×3 IMPLANT
NS IRRIG 1000ML POUR BTL (IV SOLUTION) ×3 IMPLANT
PACK BASIC LIMB (CUSTOM PROCEDURE TRAY) ×3 IMPLANT
PAD ARMBOARD 7.5X6 YLW CONV (MISCELLANEOUS) ×3 IMPLANT
SET BASIN LINEN APH (SET/KITS/TRAYS/PACK) ×3 IMPLANT
SOL PREP PROV IODINE SCRUB 4OZ (MISCELLANEOUS) ×3 IMPLANT
SPONGE LAP 18X18 X RAY DECT (DISPOSABLE) ×3 IMPLANT
SUT BONE WAX W31G (SUTURE) ×3 IMPLANT
SUT PROLENE 2 0 FS (SUTURE) ×12 IMPLANT
SWAB CULTURE LIQ STUART DBL (MISCELLANEOUS) ×3 IMPLANT
SYR CONTROL 10ML LL (SYRINGE) ×3 IMPLANT
TUBE ANAEROBIC PORT A CUL  W/M (MISCELLANEOUS) ×3 IMPLANT

## 2017-07-21 NOTE — Interval H&P Note (Signed)
History and Physical Interval Note:  07/21/2017 12:41 PM  Natalie Contreras  has presented today for surgery, with the diagnosis of gangrene, multiple left toes  The various methods of treatment have been discussed with the patient and family. After consideration of risks, benefits and other options for treatment, the patient has consented to  Procedure(s): AMPUTATION TOE (Left) as a surgical intervention .  The patient's history has been reviewed, patient examined, no change in status, stable for surgery.  I have reviewed the patient's chart and labs.  Questions were answered to the patient's satisfaction.     Aviva Signs

## 2017-07-21 NOTE — Op Note (Signed)
Patient:  Natalie Contreras  DOB:  27-Jun-1966  MRN:  [id number]   Preop Diagnosis: Gangrene of leftfoot, diabetes mellitus  Postop Diagnosis: Same  Procedure: Transmetatarsal amputation, left foot  Surgeon: Aviva Signs, MD  Assistant: Blake Divine, MD  Anes: General  Indications: Patient is a 51 year old white female status post puncture wound to left foot and left second toe amputation who now presents with nonhealing of the amputation site as well as gangrene of multiple bringing toes left foot.  She will undergo a left transmetatarsal amputation of the left foot, excluding the great toe.  The risks and benefits of the procedure including bleeding, infection, and nonhealing of her left foot wound were fully explained to the patient, who gave informed consent.  Procedure note: The patient was placed in the supine position.  After general anesthesia was administered, the left foot was prepped and draped using the usual sterile technique with Betadine.  Surgical site confirmation was performed.  An elliptical incision was made from the lateral aspect of the base of the left great toe to the fifth toe.  Transmetatarsal amputations of the left third, fourth, and fifth toes was performed.  Aerobic and anaerobic cultures were taken and sent to microbiology.  Necrotic tissue was found.  Any necrotic tissue was debrided sharply using scissors.  Any bleeding was controlled using Bovie elect cautery.  The wound was debrided back to healthy appearing tissue.  The wound was irrigated with normal saline.  Arista was placed in the base of the wound.  Xeroform was placed along the base of the wound to act as a wick.  The skin edges were reapproximated using 2-0 Prolene interrupted sutures.  Xeroform, gauze, and an Ace wrap were then applied.  All tape needle counts were correct at the end of the procedure.  The patient was awakened and transferred to PACU in stable condition.  Complications:  None  EBL: Minimal  Specimen: Left third, fourth, and fifth toes

## 2017-07-21 NOTE — Anesthesia Preprocedure Evaluation (Signed)
Anesthesia Evaluation  Patient identified by MRN, date of birth, ID band Patient awake    Airway Mallampati: I  TM Distance: >3 FB Neck ROM: Full    Dental  (+) Poor Dentition   Pulmonary Current Smoker,    Pulmonary exam normal breath sounds clear to auscultation       Cardiovascular Exercise Tolerance: Poor hypertension, Pt. on medications + Peripheral Vascular Disease and +CHF   Rhythm:Regular Rate:Normal  - Left ventricle: The cavity size was normal. Wall thickness was   increased in a pattern of mild LVH. Systolic function was normal.   The estimated ejection fraction was in the range of 55% to 60%.   Wall motion was normal; there were no regional wall motion   abnormalities. Features are consistent with a pseudonormal left   ventricular filling pattern, with concomitant abnormal relaxation   and increased filling pressure (grade 2 diastolic dysfunction).   Doppler parameters are consistent with high ventricular filling   pressure. - Aortic valve: Mildly calcified annulus. Trileaflet; mildly   thickened leaflets. Valve area (VTI): 1.65 cm^2. Valve area   (Vmax): 1.79 cm^2. Valve area (Vmean): 2.04 cm^2. - Mitral valve: Mildly calcified annulus. Mildly thickened leaflets   . There was mild regurgitation. - Left atrium: The atrium was moderately dilated. - Right atrium: The atrium was mildly dilated. - Pulmonary arteries: Systolic pressure was moderately increased.   PA peak pressure: 49 mm Hg (S).   (2018)  ECG:  NSR, old ant infarct? (2018)   Neuro/Psych Carotid stenosis    GI/Hepatic   Endo/Other  diabetes, Poorly Controlled, Type 1Results for BRITTAINY, BUCKER (MRN [id number]) as of 07/21/2017 11:55  07/21/2017 04:56 Sodium: 138 Potassium: 3.9 Chloride: 104 CO2: 24 Glucose: 198 (H) BUN: 16 Creatinine: 0.80   Renal/GU      Musculoskeletal   Abdominal Normal abdominal exam  (+)   Peds  Hematology    Anesthesia Other Findings   Reproductive/Obstetrics                             Anesthesia Physical Anesthesia Plan  ASA: IV  Anesthesia Plan: General   Post-op Pain Management:    Induction: Intravenous  PONV Risk Score and Plan: 2  Airway Management Planned: Oral ETT  Additional Equipment:   Intra-op Plan:   Post-operative Plan: Extubation in OR  Informed Consent: I have reviewed the patients History and Physical, chart, labs and discussed the procedure including the risks, benefits and alternatives for the proposed anesthesia with the patient or authorized representative who has indicated his/her understanding and acceptance.   Dental advisory given  Plan Discussed with: CRNA and Surgeon  Anesthesia Plan Comments:         Anesthesia Quick Evaluation

## 2017-07-21 NOTE — Progress Notes (Signed)
Natalie Contreras  PROGRESS NOTE    GLORIAN MCDONELL  EGB:[id number] DOB: 03-14-1966 DOA: 07/17/2017 PCP: [name], MD    Brief Narrative:  51 year old female with a history of hypertension, diabetes, admitted to the hospital with swelling/redness of her left foot and foul odor coming from the left foot wound.  She was found to have evidence of osteomyelitis on plain films done in the emergency room.  She is admitted for IV antibiotics and further surgical management.   Assessment & Plan:   Principal Problem:   Cellulitis and abscess of foot Active Problems:   Diabetes mellitus with neuropathy (HCC)   Pulmonary hypertension (HCC)   PAD (peripheral artery disease) (HCC)   Essential hypertension   Chronic diastolic CHF (congestive heart failure) (HCC)   Toe amputation status, left (HCC)   Gangrene of left foot ([place])   1. 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  She is on intravenous antibiotics.  General surgery is following. Plan is for operative management today.  Continue current treatments. 2. Diabetes.  On sliding scale insulin.  Oral medications on hold.  Blood sugars are stable. 3. Chronic diastolic CHF.  Appears compensated.  Diuretics currently on hold. 4. Anemia.  Suspect is related to chronic disease.  Likely related to her chronic foot infection.  No obvious signs of bleeding.  Transfuse for hemoglobin less than 7.  Anemia panel indicates iron deficiency as well as chronic disease. 5. Hypertension.  Blood pressure elevated. Continue on hydralazine. Restart home dose of coreg   DVT prophylaxis: Lovenox Code Status: Full code Family Communication: No family present Disposition Plan: Discharge home once improved   Consultants:   General surgery  Procedures:     Antimicrobials:   Vancomycin 11/1 >  Zosyn 11/1 >   Subjective: Patient seen in room prior to surgery.  Denies any complaints at this time.  Objective: Vitals:   07/21/17 1325 07/21/17 1448  07/21/17 1515 07/21/17 1536  BP: (!) 199/84 (!) 189/72 (!) 181/69 (!) 182/71  Pulse:  76 72 73  Resp: (!) 21 20 19 18   Temp:  98.3 F (36.8 C)  98 F (36.7 C)  TempSrc:      SpO2: 100% 96% 98% 92%  Weight:      Height:        Intake/Output Summary (Last 24 hours) at 07/21/2017 1539 Last data filed at 07/21/2017 1448 Gross per 24 hour  Intake 1861.67 ml  Output 875 ml  Net 986.67 ml   Filed Weights   07/17/17 1138 07/17/17 1709  Weight: 94.8 kg (209 lb) 94.8 kg (209 lb)    Examination:  General exam: Appears calm and comfortable  Respiratory system: Clear to auscultation. Respiratory effort normal. Cardiovascular system: S1 & S2 heard, RRR. No JVD, murmurs, rubs, gallops or clicks. No pedal edema. Gastrointestinal system: Abdomen is nondistended, soft and nontender. No organomegaly or masses felt. Normal bowel sounds heard. Central nervous system: Alert and oriented. No focal neurological deficits. Extremities: Symmetric 5 x 5 power. Skin: Gangrenous changes noted to fourth and fifth toes, open wounds on left foot, mild erythema of left foot.  Foul-smelling discharge. Psychiatry: Judgement and insight appear normal. Mood & affect appropriate.     Data Reviewed: I have personally reviewed following labs and imaging studies  CBC: Recent Labs  Lab 07/17/17 1216 07/18/17 0507 07/19/17 0633 07/20/17 0627  WBC 11.3* 9.8 9.3 8.9  NEUTROABS 9.4*  --   --   --   HGB 8.9* 7.6* 7.8* 7.8*  HCT 28.6* 24.7* 25.2* 25.9*  MCV 78.6 79.7 80.3 80.9  PLT 426* 376 386 185   Basic Metabolic Panel: Recent Labs  Lab 07/17/17 1216 07/18/17 0507 07/19/17 0633 07/20/17 0627 07/21/17 0456  NA 133* 136 138 137 138  K 4.4 4.3 4.0 4.2 3.9  CL 99* 104 105 103 104  CO2 24 24 25 25 24   GLUCOSE 310* 166* 139* 156* 198*  BUN 35* 29* 20 14 16   CREATININE 1.14* 1.01* 0.81 0.86 0.80  CALCIUM 8.8* 8.4* 8.1* 8.1* 8.0*   GFR: Estimated Creatinine Clearance: 94.9 mL/min (by C-G formula  based on SCr of 0.8 mg/dL). Liver Function Tests: Recent Labs  Lab 07/17/17 1216  AST 36  ALT 42  ALKPHOS 313*  BILITOT 0.7  PROT 7.2  ALBUMIN 2.7*   No results for input(s): LIPASE, AMYLASE in the last 168 hours. No results for input(s): AMMONIA in the last 168 hours. Coagulation Profile: No results for input(s): INR, PROTIME in the last 168 hours. Cardiac Enzymes: No results for input(s): CKTOTAL, CKMB, CKMBINDEX, TROPONINI in the last 168 hours. BNP (last 3 results) No results for input(s): PROBNP in the last 8760 hours. HbA1C: Recent Labs    07/19/17 0633  HGBA1C 7.1*   CBG: Recent Labs  Lab 07/20/17 1645 07/20/17 2126 07/21/17 0734 07/21/17 1120 07/21/17 1451  GLUCAP 247* 207* 184* 120* 129*   Lipid Profile: No results for input(s): CHOL, HDL, LDLCALC, TRIG, CHOLHDL, LDLDIRECT in the last 72 hours. Thyroid Function Tests: No results for input(s): TSH, T4TOTAL, FREET4, T3FREE, THYROIDAB in the last 72 hours. Anemia Panel: No results for input(s): VITAMINB12, FOLATE, FERRITIN, TIBC, IRON, RETICCTPCT in the last 72 hours. Sepsis Labs: No results for input(s): PROCALCITON, LATICACIDVEN in the last 168 hours.  Recent Results (from the past 240 hour(s))  Blood culture (routine x 2)     Status: None (Preliminary result)   Collection Time: 07/17/17  2:39 PM  Result Value Ref Range Status   Specimen Description RIGHT ANTECUBITAL  Final   Special Requests   Final    BOTTLES DRAWN AEROBIC AND ANAEROBIC Blood Culture adequate volume   Culture NO GROWTH 4 DAYS  Final   Report Status PENDING  Incomplete  Blood culture (routine x 2)     Status: None (Preliminary result)   Collection Time: 07/17/17  2:39 PM  Result Value Ref Range Status   Specimen Description LEFT ANTECUBITAL  Final   Special Requests   Final    BOTTLES DRAWN AEROBIC ONLY Blood Culture adequate volume   Culture NO GROWTH 4 DAYS  Final   Report Status PENDING  Incomplete  Surgical pcr screen      Status: None   Collection Time: 07/18/17  8:23 PM  Result Value Ref Range Status   MRSA, PCR NEGATIVE NEGATIVE Final   Staphylococcus aureus NEGATIVE NEGATIVE Final    Comment: (NOTE) The Xpert SA Assay (FDA approved for NASAL specimens in patients 27 years of age and older), is one component of a comprehensive surveillance program. It is not intended to diagnose infection nor to guide or monitor treatment.          Radiology Studies: No results found.      Scheduled Meds: . enoxaparin (LOVENOX) injection  40 mg Subcutaneous Q24H  . hydrALAZINE  50 mg Oral TID  . insulin aspart  0-15 Units Subcutaneous TID WC  . insulin aspart  0-5 Units Subcutaneous QHS  . sodium chloride flush  3 mL Intravenous  Q12H   Continuous Infusions: . sodium chloride 250 mL (07/18/17 1110)  . piperacillin-tazobactam (ZOSYN)  IV 3.375 g (07/21/17 1038)  . vancomycin Stopped (07/21/17 0201)     LOS: 4 days    Time spent: 64mins    Shaquille Murdy, MD [facility] Pager [phone]  If 7PM-7AM, please contact night-coverage [email or web address] Password [facility] 07/21/2017, 3:39 PM

## 2017-07-21 NOTE — Anesthesia Procedure Notes (Signed)
Procedure Name: Intubation Date/Time: 07/21/2017 1:51 PM Performed by: Andree Elk, Amy A, CRNA Pre-anesthesia Checklist: Patient identified, Patient being monitored, Timeout performed, Emergency Drugs available and Suction available Patient Re-evaluated:Patient Re-evaluated prior to induction Oxygen Delivery Method: Circle System Utilized Preoxygenation: Pre-oxygenation with 100% oxygen Induction Type: IV induction Ventilation: Mask ventilation without difficulty Laryngoscope Size: Mac and 3 Grade View: Grade II Tube type: Oral Tube size: 7.0 mm Number of attempts: 1 Airway Equipment and Method: Stylet Placement Confirmation: ETT inserted through vocal cords under direct vision,  positive ETCO2 and breath sounds checked- equal and bilateral Secured at: 21 cm Tube secured with: Tape Dental Injury: Teeth and Oropharynx as per pre-operative assessment

## 2017-07-21 NOTE — Transfer of Care (Signed)
Immediate Anesthesia Transfer of Care Note  Patient: Natalie Contreras  Procedure(s) Performed: TRANSMETATARSAL AMPUTATION LEFT FOOT (Left Foot)  Patient Location: PACU  Anesthesia Type:General  Level of Consciousness: awake, oriented and patient cooperative  Airway & Oxygen Therapy: Patient Spontanous Breathing and Patient connected to nasal cannula oxygen  Post-op Assessment: Report given to RN and Post -op Vital signs reviewed and stable  Post vital signs: Reviewed and stable  Last Vitals:  Vitals:   07/21/17 1320 07/21/17 1325  BP:  (!) 199/84  Pulse:    Resp: (!) 22 (!) 21  Temp:    SpO2: 99% 100%    Last Pain:  Vitals:   07/21/17 1206  TempSrc: Oral  PainSc:       Patients Stated Pain Goal: 1 (01/56/15 3794)  Complications: No apparent anesthesia complications

## 2017-07-22 ENCOUNTER — Encounter (HOSPITAL_COMMUNITY): Payer: Self-pay | Admitting: General Surgery

## 2017-07-22 LAB — GLUCOSE, CAPILLARY
GLUCOSE-CAPILLARY: 232 mg/dL — AB (ref 65–99)
GLUCOSE-CAPILLARY: 155 mg/dL — AB (ref 65–99)
Glucose-Capillary: 182 mg/dL — ABNORMAL HIGH (ref 65–99)
Glucose-Capillary: 190 mg/dL — ABNORMAL HIGH (ref 65–99)

## 2017-07-22 LAB — CULTURE, BLOOD (ROUTINE X 2)
CULTURE: NO GROWTH
Culture: NO GROWTH
SPECIAL REQUESTS: ADEQUATE
Special Requests: ADEQUATE

## 2017-07-22 LAB — BASIC METABOLIC PANEL
Anion gap: 7 (ref 5–15)
BUN: 17 mg/dL (ref 6–20)
CHLORIDE: 104 mmol/L (ref 101–111)
CO2: 27 mmol/L (ref 22–32)
CREATININE: 1.08 mg/dL — AB (ref 0.44–1.00)
Calcium: 8.2 mg/dL — ABNORMAL LOW (ref 8.9–10.3)
GFR calc non Af Amer: 59 mL/min — ABNORMAL LOW (ref >60)
Glucose, Bld: 185 mg/dL — ABNORMAL HIGH (ref 65–99)
POTASSIUM: 4.2 mmol/L (ref 3.5–5.1)
Sodium: 138 mmol/L (ref 135–145)

## 2017-07-22 LAB — CBC
HEMATOCRIT: 25 % — AB (ref 36.0–46.0)
HEMOGLOBIN: 7.5 g/dL — AB (ref 12.0–15.0)
MCH: 24.8 pg — AB (ref 26.0–34.0)
MCHC: 30 g/dL (ref 30.0–36.0)
MCV: 82.8 fL (ref 78.0–100.0)
PLATELETS: 363 10*3/uL (ref 150–400)
RBC: 3.02 MIL/uL — AB (ref 3.87–5.11)
RDW: 17.6 % — ABNORMAL HIGH (ref 11.5–15.5)
WBC: 8.7 10*3/uL (ref 4.0–10.5)

## 2017-07-22 NOTE — Evaluation (Signed)
Physical Therapy Evaluation Patient Details Name: Natalie Contreras MRN: [id number] DOB: [birth year] Today's Date: 07/22/2017   History of Present Illness  Natalie Contreras is a 51 y.o. female s/p Transmetatarsal amputations of the left third, fourth, and fifth toes on 07/21/17, with medical history significant of recent left toe amputation by dr Arnoldo Morale, chf diastolic , PAD, DM comes in with worsening swelling and redness and foul order to left wound.  Pt saw dr Arnoldo Morale today in follow up and he sent to ED for admission for iv abx.  Pt denies any fevers.  The foot has been worsening for days.  No n/v/d.  There has been swelling in the legs also, denies sob or cp.  Pt found to have wound infection with likely osteo and will likely need further amputation of foot.    Clinical Impression  Patient functioning near baseline for functional mobility and gait, demonstrates good return for LLE heel weight bearing during gait training with no loss of balance.  Patient will benefit from continued physical therapy in hospital to increase strength, balance, endurance for safe ADLs and gait.    Follow Up Recommendations No PT follow up    Equipment Recommendations  None recommended by PT    Recommendations for Other Services       Precautions / Restrictions Precautions Precautions: None Restrictions Weight Bearing Restrictions: Yes LLE Weight Bearing: Partial weight bearing LLE Partial Weight Bearing Percentage or Pounds: NWB on left forefoot, heel weight bearing only      Mobility  Bed Mobility Overal bed mobility: Modified Independent                Transfers Overall transfer level: Modified independent Equipment used: Rolling walker (2 wheeled)             General transfer comment: when not using RW, leans on nearby objects for support  Ambulation/Gait Ambulation/Gait assistance: Supervision Ambulation Distance (Feet): 80 Feet Assistive device: Rolling walker (2  wheeled) Gait Pattern/deviations: Decreased step length - left;Decreased stance time - left;Decreased stride length   Gait velocity interpretation: Below normal speed for age/gender General Gait Details: demonstrates good return for keeping body weight off left forefoot  Stairs            Wheelchair Mobility    Modified Rankin (Stroke Patients Only)       Balance Overall balance assessment: No apparent balance deficits (not formally assessed)                                           Pertinent Vitals/Pain Pain Assessment: No/denies pain    Home Living Family/patient expects to be discharged to:: Private residence Living Arrangements: Children Available Help at Discharge: Family;Available 24 hours/day Type of Home: Apartment Home Access: Level entry     Home Layout: One level Home Equipment: Grab bars - tub/shower;Shower seat;Walker - 2 wheels;Wheelchair - manual      Prior Function Level of Independence: Independent               Hand Dominance        Extremity/Trunk Assessment   Upper Extremity Assessment Upper Extremity Assessment: Overall WFL for tasks assessed    Lower Extremity Assessment Lower Extremity Assessment: Overall WFL for tasks assessed;LLE deficits/detail LLE Deficits / Details: grossly -4/5 except left foot/ankle grossly 3+/5       Communication   Communication: No  difficulties  Cognition Arousal/Alertness: Awake/alert Behavior During Therapy: WFL for tasks assessed/performed Overall Cognitive Status: Within Functional Limits for tasks assessed                                        General Comments      Exercises General Exercises - Lower Extremity Ankle Circles/Pumps: Seated;Strengthening;Both;10 reps Long Arc Quad: Seated;AROM;Strengthening;Both;10 reps Hip Flexion/Marching: Seated;AROM;Strengthening;Both;10 reps   Assessment/Plan    PT Assessment Patient needs continued PT  services  PT Problem List Decreased strength;Decreased activity tolerance;Decreased mobility       PT Treatment Interventions Gait training;Functional mobility training;Therapeutic exercise;Therapeutic activities;Patient/family education    PT Goals (Current goals can be found in the Care Plan section)  Acute Rehab PT Goals Patient Stated Goal: return home today PT Goal Formulation: With patient Time For Goal Achievement: 07/25/17 Potential to Achieve Goals: Good    Frequency Min 3X/week   Barriers to discharge        Co-evaluation               AM-PAC PT "6 Clicks" Daily Activity  Outcome Measure Difficulty turning over in bed (including adjusting bedclothes, sheets and blankets)?: None Difficulty moving from lying on back to sitting on the side of the bed? : None Difficulty sitting down on and standing up from a chair with arms (e.g., wheelchair, bedside commode, etc,.)?: None Help needed moving to and from a bed to chair (including a wheelchair)?: None Help needed walking in hospital room?: A Little Help needed climbing 3-5 steps with a railing? : A Little 6 Click Score: 22    End of Session   Activity Tolerance: Patient tolerated treatment well Patient left: in chair;with call bell/phone within reach Nurse Communication: Mobility status PT Visit Diagnosis: Unsteadiness on feet (R26.81);Other abnormalities of gait and mobility (R26.89);Muscle weakness (generalized) (M62.81)    Time: 1245-8099 PT Time Calculation (min) (ACUTE ONLY): 27 min   Charges:   PT Evaluation $PT Eval Low Complexity: 1 Low PT Treatments $Therapeutic Activity: 8-22 mins   PT G Codes:        9:13 AM, 03-Aug-2017 Lonell Grandchild, MPT Physical Therapist with [facility] [phone] office [phone] mobile phone

## 2017-07-22 NOTE — Progress Notes (Addendum)
1 Day Post-Op  Subjective: Patient has no incisional pain.  Has been seen by physical therapy.  Objective: Vital signs in last 24 hours: Temp:  [98 F (36.7 C)-98.5 F (36.9 C)] 98.5 F (36.9 C) (11/06 0612) Pulse Rate:  [66-76] 68 (11/06 0612) Resp:  [10-23] 16 (11/06 0612) BP: (143-199)/(50-126) 145/60 (11/06 0612) SpO2:  [80 %-100 %] 98 % (11/06 0612) Last BM Date: 07/20/17  Intake/Output from previous day: 11/05 0701 - 11/06 0700 In: 1913 [P.O.:480; I.V.:1033; IV Piggyback:400] Out: 875 [Urine:850; Blood:25] Intake/Output this shift: No intake/output data recorded.  General appearance: alert, cooperative and no distress Extremities: Left foot dressing intact.  Mild bilateral lower extremity edema present.  Not worse since surgery.  Lab Results:  Recent Labs    07/20/17 0627 07/22/17 0605  WBC 8.9 8.7  HGB 7.8* 7.5*  HCT 25.9* 25.0*  PLT 396 363   BMET Recent Labs    07/21/17 0456 07/22/17 0605  NA 138 138  K 3.9 4.2  CL 104 104  CO2 24 27  GLUCOSE 198* 185*  BUN 16 17  CREATININE 0.80 1.08*  CALCIUM 8.0* 8.2*   PT/INR No results for input(s): LABPROT, INR in the last 72 hours.  Studies/Results: No results found.  Anti-infectives: Anti-infectives (From admission, onward)   Start     Dose/Rate Route Frequency Ordered Stop   07/20/17 2200  vancomycin (VANCOCIN) 1,250 mg in sodium chloride 0.9 % 250 mL IVPB     1,250 mg 166.7 mL/hr over 90 Minutes Intravenous Every 24 hours 07/20/17 0830     07/18/17 0600  vancomycin (VANCOCIN) 1,250 mg in sodium chloride 0.9 % 250 mL IVPB  Status:  Discontinued     1,250 mg 166.7 mL/hr over 90 Minutes Intravenous Every 12 hours 07/17/17 1641 07/20/17 0826   07/17/17 1800  piperacillin-tazobactam (ZOSYN) IVPB 3.375 g     3.375 g 12.5 mL/hr over 240 Minutes Intravenous Every 8 hours 07/17/17 1639     07/17/17 1800  vancomycin (VANCOCIN) 1,500 mg in sodium chloride 0.9 % 500 mL IVPB     1,500 mg 250 mL/hr over 120  Minutes Intravenous  Once 07/17/17 1640 07/17/17 1938   07/17/17 1545  ciprofloxacin (CIPRO) IVPB 400 mg    Comments:  osteomyleitis   400 mg 200 mL/hr over 60 Minutes Intravenous  Once 07/17/17 1536 07/17/17 1708   07/17/17 1430  ceFAZolin (ANCEF) IVPB 1 g/50 mL premix     1 g 100 mL/hr over 30 Minutes Intravenous  Once 07/17/17 1427 07/17/17 1608      Assessment/Plan: s/p Procedure(s): TRANSMETATARSAL AMPUTATION LEFT FOOT Impression: Stable on postoperative day 1.  Hemoglobin stable.  Plan: We will continue current therapy.  Anticipate discharge in the next 24-48 hours.  LOS: 5 days  Addendum::  Dry, wet gangrene noted in soft tissue at time of surgery.  Aviva Signs 07/22/2017

## 2017-07-22 NOTE — Progress Notes (Signed)
[name]  PROGRESS NOTE    Natalie Contreras  POE:[id number] DOB: 04/10/1966 DOA: 07/17/2017 PCP: [name], MD    Brief Narrative:  51 year old female with a history of hypertension, diabetes, admitted to the hospital with swelling/redness of her left foot and foul odor coming from the left foot wound.  She was found to have evidence of osteomyelitis on plain films done in the emergency room.  She is admitted for IV antibiotics and further surgical management.   Assessment & Plan:   Principal Problem:   Cellulitis and abscess of foot Active Problems:   Diabetes mellitus with neuropathy (HCC)   Pulmonary hypertension (HCC)   PAD (peripheral artery disease) (HCC)   Essential hypertension   Chronic diastolic CHF (congestive heart failure) (HCC)   Toe amputation status, left (HCC)   Gangrene of left foot ([place])   1. 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  She is on intravenous antibiotics.  General surgery is following. Plan is for operative management today.  Continue current treatments. 2. Diabetes.  On sliding scale insulin.  Oral medications on hold.  Blood sugars are stable. 3. Chronic diastolic CHF.  Appears compensated.  Diuretics currently on hold. 4. Anemia.  Suspect is related to chronic disease.  Likely related to her chronic foot infection.  No obvious signs of bleeding.  Transfuse for hemoglobin less than 7.  Anemia panel indicates iron deficiency as well as chronic disease. 5. Hypertension.  Blood pressure stable on Coreg and hydralazine.   DVT prophylaxis: Lovenox Code Status: Full code Family Communication: No family present Disposition Plan: Discharge home once improved   Consultants:   General surgery  Procedures:     Antimicrobials:   Vancomycin 11/1 >  Zosyn 11/1 >   Subjective: Feels better.  No new complaints.  Objective: Vitals:   07/21/17 2001 07/21/17 2128 07/22/17 0612 07/22/17 1514  BP: (!) 143/50  (!) 145/60 (!) 146/45    Pulse: 66  68 63  Resp: 16  16 20   Temp: 98.2 F (36.8 C)  98.5 F (36.9 C) 98 F (36.7 C)  TempSrc: Oral  Oral   SpO2: 97% 97% 98% 98%  Weight:      Height:        Intake/Output Summary (Last 24 hours) at 07/22/2017 1602 Last data filed at 07/22/2017 0550 Gross per 24 hour  Intake 1113 ml  Output -  Net 1113 ml   Filed Weights   07/17/17 1138 07/17/17 1709  Weight: 94.8 kg (209 lb) 94.8 kg (209 lb)    Examination:  General exam: Appears calm and comfortable  Respiratory system: Clear to auscultation. Respiratory effort normal. Cardiovascular system: S1 & S2 heard, RRR. No JVD, murmurs, rubs, gallops or clicks. No pedal edema. Gastrointestinal system: Abdomen is nondistended, soft and nontender. No organomegaly or masses felt. Normal bowel sounds heard. Central nervous system: Alert and oriented. No focal neurological deficits. Extremities: Symmetric 5 x 5 power. Skin: left foot is wrapped in dressings.  Psychiatry: Judgement and insight appear normal. Mood & affect appropriate.     Data Reviewed: I have personally reviewed following labs and imaging studies  CBC: Recent Labs  Lab 07/17/17 1216 07/18/17 0507 07/19/17 0633 07/20/17 0627 07/22/17 0605  WBC 11.3* 9.8 9.3 8.9 8.7  NEUTROABS 9.4*  --   --   --   --   HGB 8.9* 7.6* 7.8* 7.8* 7.5*  HCT 28.6* 24.7* 25.2* 25.9* 25.0*  MCV 78.6 79.7 80.3 80.9 82.8  PLT 426* 376  386 396 160   Basic Metabolic Panel: Recent Labs  Lab 07/18/17 0507 07/19/17 0633 07/20/17 0627 07/21/17 0456 07/22/17 0605  NA 136 138 137 138 138  K 4.3 4.0 4.2 3.9 4.2  CL 104 105 103 104 104  CO2 24 25 25 24 27   GLUCOSE 166* 139* 156* 198* 185*  BUN 29* 20 14 16 17   CREATININE 1.01* 0.81 0.86 0.80 1.08*  CALCIUM 8.4* 8.1* 8.1* 8.0* 8.2*   GFR: Estimated Creatinine Clearance: 70.3 mL/min (A) (by C-G formula based on SCr of 1.08 mg/dL (H)). Liver Function Tests: Recent Labs  Lab 07/17/17 1216  AST 36  ALT 42  ALKPHOS 313*   BILITOT 0.7  PROT 7.2  ALBUMIN 2.7*   No results for input(s): LIPASE, AMYLASE in the last 168 hours. No results for input(s): AMMONIA in the last 168 hours. Coagulation Profile: No results for input(s): INR, PROTIME in the last 168 hours. Cardiac Enzymes: No results for input(s): CKTOTAL, CKMB, CKMBINDEX, TROPONINI in the last 168 hours. BNP (last 3 results) No results for input(s): PROBNP in the last 8760 hours. HbA1C: No results for input(s): HGBA1C in the last 72 hours. CBG: Recent Labs  Lab 07/21/17 1451 07/21/17 1621 07/21/17 2109 07/22/17 0802 07/22/17 1137  GLUCAP 129* 113* 224* 182* 232*   Lipid Profile: No results for input(s): CHOL, HDL, LDLCALC, TRIG, CHOLHDL, LDLDIRECT in the last 72 hours. Thyroid Function Tests: No results for input(s): TSH, T4TOTAL, FREET4, T3FREE, THYROIDAB in the last 72 hours. Anemia Panel: No results for input(s): VITAMINB12, FOLATE, FERRITIN, TIBC, IRON, RETICCTPCT in the last 72 hours. Sepsis Labs: No results for input(s): PROCALCITON, LATICACIDVEN in the last 168 hours.  Recent Results (from the past 240 hour(s))  Blood culture (routine x 2)     Status: None   Collection Time: 07/17/17  2:39 PM  Result Value Ref Range Status   Specimen Description RIGHT ANTECUBITAL  Final   Special Requests   Final    BOTTLES DRAWN AEROBIC AND ANAEROBIC Blood Culture adequate volume   Culture NO GROWTH 5 DAYS  Final   Report Status 07/22/2017 FINAL  Final  Blood culture (routine x 2)     Status: None   Collection Time: 07/17/17  2:39 PM  Result Value Ref Range Status   Specimen Description LEFT ANTECUBITAL  Final   Special Requests   Final    BOTTLES DRAWN AEROBIC ONLY Blood Culture adequate volume   Culture NO GROWTH 5 DAYS  Final   Report Status 07/22/2017 FINAL  Final  Surgical pcr screen     Status: None   Collection Time: 07/18/17  8:23 PM  Result Value Ref Range Status   MRSA, PCR NEGATIVE NEGATIVE Final   Staphylococcus aureus  NEGATIVE NEGATIVE Final    Comment: (NOTE) The Xpert SA Assay (FDA approved for NASAL specimens in patients 50 years of age and older), is one component of a comprehensive surveillance program. It is not intended to diagnose infection nor to guide or monitor treatment.   Aerobic/Anaerobic Culture (surgical/deep wound)     Status: None (Preliminary result)   Collection Time: 07/21/17  2:52 PM  Result Value Ref Range Status   Specimen Description WOUND  Final   Special Requests NONE  Final   Gram Stain   Final    NO WBC SEEN NO SQUAMOUS EPITHELIAL CELLS SEEN FEW GRAM POSITIVE COCCI IN PAIRS    Culture   Final    NO GROWTH < 12 HOURS Performed  at [facility], [place] [address], [place], [place] [address]    Report Status PENDING  Incomplete         Radiology Studies: No results found.      Scheduled Meds: . carvedilol  6.25 mg Oral BID WC  . enoxaparin (LOVENOX) injection  40 mg Subcutaneous Q24H  . hydrALAZINE  50 mg Oral TID  . insulin aspart  0-15 Units Subcutaneous TID WC  . insulin aspart  0-5 Units Subcutaneous QHS  . sodium chloride flush  3 mL Intravenous Q12H   Continuous Infusions: . sodium chloride 250 mL (07/21/17 2015)  . piperacillin-tazobactam (ZOSYN)  IV Stopped (07/22/17 1507)  . vancomycin Stopped (07/21/17 2347)     LOS: 5 days    Time spent: 53mins    Langley Ingalls, MD [facility] Pager [phone]  If 7PM-7AM, please contact night-coverage [email or web address] Password [email or web address] 07/22/2017, 4:02 PM

## 2017-07-22 NOTE — Plan of Care (Addendum)
  Acute Rehab PT Goals(only PT should resolve) Pt Will Transfer Bed To Chair/Chair To Bed 07/22/2017 0914 - Progressing by Lonell Grandchild, PT Flowsheets Taken 07/22/2017 0914  Pt will Transfer Bed to Chair/Chair to Bed Independently Pt Will Ambulate 07/22/2017 0914 by Lonell Grandchild, PT Flowsheets Taken 07/22/2017 0914  Pt will Ambulate > 125 feet;with modified independence    9:18 AM, 07/22/17 Lonell Grandchild, MPT Physical Therapist with [facility] [phone] office [phone] mobile phone

## 2017-07-22 NOTE — Anesthesia Postprocedure Evaluation (Signed)
Anesthesia Post Note  Patient: Natalie Contreras  Procedure(s) Performed: TRANSMETATARSAL AMPUTATION LEFT FOOT (Left Foot)  Patient location during evaluation: Nursing Unit Anesthesia Type: General Level of consciousness: awake and alert and oriented Pain management: pain level controlled Vital Signs Assessment: post-procedure vital signs reviewed and stable Respiratory status: spontaneous breathing Cardiovascular status: blood pressure returned to baseline Postop Assessment: no apparent nausea or vomiting and adequate PO intake Anesthetic complications: no     Last Vitals:  Vitals:   07/21/17 2128 07/22/17 0612  BP:  (!) 145/60  Pulse:  68  Resp:  16  Temp:  36.9 C  SpO2: 97% 98%    Last Pain:  Vitals:   07/22/17 0612  TempSrc: Oral  PainSc: 0-No pain                 [name]

## 2017-07-23 DIAGNOSIS — I739 Peripheral vascular disease, unspecified: Secondary | ICD-10-CM

## 2017-07-23 LAB — TYPE AND SCREEN
ABO/RH(D): O POS
ANTIBODY SCREEN: NEGATIVE
Unit division: 0
Unit division: 0

## 2017-07-23 LAB — CBC
HCT: 23.8 % — ABNORMAL LOW (ref 36.0–46.0)
Hemoglobin: 7.3 g/dL — ABNORMAL LOW (ref 12.0–15.0)
MCH: 25.1 pg — ABNORMAL LOW (ref 26.0–34.0)
MCHC: 30.7 g/dL (ref 30.0–36.0)
MCV: 81.8 fL (ref 78.0–100.0)
PLATELETS: 346 10*3/uL (ref 150–400)
RBC: 2.91 MIL/uL — ABNORMAL LOW (ref 3.87–5.11)
RDW: 17.5 % — AB (ref 11.5–15.5)
WBC: 6.7 10*3/uL (ref 4.0–10.5)

## 2017-07-23 LAB — BPAM RBC
Blood Product Expiration Date: 201811292359
Blood Product Expiration Date: 201811292359
UNIT TYPE AND RH: 5100
Unit Type and Rh: 5100

## 2017-07-23 LAB — BASIC METABOLIC PANEL
ANION GAP: 11 (ref 5–15)
BUN: 19 mg/dL (ref 6–20)
CO2: 23 mmol/L (ref 22–32)
Calcium: 8.2 mg/dL — ABNORMAL LOW (ref 8.9–10.3)
Chloride: 101 mmol/L (ref 101–111)
Creatinine, Ser: 1.1 mg/dL — ABNORMAL HIGH (ref 0.44–1.00)
GFR, EST NON AFRICAN AMERICAN: 58 mL/min — AB (ref >60)
Glucose, Bld: 171 mg/dL — ABNORMAL HIGH (ref 65–99)
POTASSIUM: 3.9 mmol/L (ref 3.5–5.1)
SODIUM: 135 mmol/L (ref 135–145)

## 2017-07-23 LAB — GLUCOSE, CAPILLARY
GLUCOSE-CAPILLARY: 204 mg/dL — AB (ref 65–99)
Glucose-Capillary: 148 mg/dL — ABNORMAL HIGH (ref 65–99)

## 2017-07-23 MED ORDER — SULFAMETHOXAZOLE-TRIMETHOPRIM 400-80 MG PO TABS
1.0000 | ORAL_TABLET | Freq: Two times a day (BID) | ORAL | 0 refills | Status: DC
Start: 2017-07-23 — End: 2019-06-15

## 2017-07-23 MED ORDER — GABAPENTIN 300 MG PO CAPS: 300.0000 mg | ORAL_CAPSULE | Freq: Three times a day (TID) | ORAL | 1 refills | Status: DC

## 2017-07-23 NOTE — Discharge Instructions (Signed)
Wash left foot daily with soap and water.  Keep left foot elevated with two pillows as much as possible.

## 2017-07-23 NOTE — Progress Notes (Signed)
[name]  PROGRESS NOTE    Natalie Contreras  NLG:[id number] DOB: 07-15-1966 DOA: 07/17/2017 PCP: [name], MD    Brief Narrative:  51 year old female with a history of hypertension, diabetes, admitted to the hospital with swelling/redness of her left foot and foul odor coming from the left foot wound.  She was found to have evidence of osteomyelitis on plain films done in the emergency room.  She is admitted for IV antibiotics and further surgical management.  Assessment & Plan:   Principal Problem:   Cellulitis and abscess of foot Active Problems:   Diabetes mellitus with neuropathy (HCC)   Pulmonary hypertension (HCC)   PAD (peripheral artery disease) (HCC)   Essential hypertension   Chronic diastolic CHF (congestive heart failure) (HCC)   Toe amputation status, left (HCC)   Gangrene of left foot ([place])   1. 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  She is on intravenous antibiotics.  General surgery is following. s/p operative treatment. Postop care per general surgery.  2. Diabetes type 2 - well controlled on supplemental sliding scale insulin.  Oral medications on hold.  Blood sugars are stable. 3. Chronic diastolic CHF.  Appears compensated.  Diuretics currently on hold. 4. Anemia.  Suspect is related to chronic disease and operative losses, No obvious signs of severe bleeding.  Transfuse for hemoglobin less than 7.  Anemia panel indicates iron deficiency as well as chronic disease. 5. Hypertension.  Blood pressure stable but suboptimally controlled on Coreg and hydralazine.  Hold ACEI due to AKI.  6. AKI - likely prerenal from dehydration, holding diuretics for now.   DVT prophylaxis: Lovenox Code Status: Full code Family Communication: No family present Disposition Plan: per general surgery team  Antimicrobials:   Vancomycin 11/1 >  Zosyn 11/1 >   Subjective: Feels better.  No new complaints. Wants to go  Home.   Objective: Vitals:   07/22/17 2059  07/22/17 2141 07/23/17 0624 07/23/17 0829  BP:  (!) 149/46 (!) 169/59   Pulse:  94 (!) 58 62  Resp:  18 16   Temp:  97.6 F (36.4 C) 97.7 F (36.5 C)   TempSrc:  Oral Oral   SpO2: 97% 97% 99%   Weight:   96.3 kg (212 lb 3.2 oz)   Height:        Intake/Output Summary (Last 24 hours) at 07/23/2017 0929 Last data filed at 07/23/2017 0653 Gross per 24 hour  Intake 788.5 ml  Output 100 ml  Net 688.5 ml   Filed Weights   07/17/17 1138 07/17/17 1709 07/23/17 0624  Weight: 94.8 kg (209 lb) 94.8 kg (209 lb) 96.3 kg (212 lb 3.2 oz)    Examination:  General exam: Appears calm and comfortable  Respiratory system: Clear to auscultation. Respiratory effort normal. Cardiovascular system: S1 & S2 heard, RRR. No JVD, murmurs, rubs, gallops or clicks. No pedal edema. Gastrointestinal system: Abdomen is nondistended, soft and nontender. No organomegaly or masses felt. Normal bowel sounds heard. Central nervous system: Alert and oriented. No focal neurological deficits. Extremities: Symmetric 5 x 5 power. Skin: left foot is wrapped in dressings.  Psychiatry: Judgement and insight appear normal. Mood & affect appropriate.   Data Reviewed: I have personally reviewed following labs and imaging studies  CBC: Recent Labs  Lab 07/17/17 1216 07/18/17 0507 07/19/17 0633 07/20/17 0627 07/22/17 0605 07/23/17 0004  WBC 11.3* 9.8 9.3 8.9 8.7 6.7  NEUTROABS 9.4*  --   --   --   --   --  HGB 8.9* 7.6* 7.8* 7.8* 7.5* 7.3*  HCT 28.6* 24.7* 25.2* 25.9* 25.0* 23.8*  MCV 78.6 79.7 80.3 80.9 82.8 81.8  PLT 426* 376 386 396 363 841   Basic Metabolic Panel: Recent Labs  Lab 07/19/17 0633 07/20/17 0627 07/21/17 0456 07/22/17 0605 07/22/17 2357  NA 138 137 138 138 135  K 4.0 4.2 3.9 4.2 3.9  CL 105 103 104 104 101  CO2 25 25 24 27 23   GLUCOSE 139* 156* 198* 185* 171*  BUN 20 14 16 17 19   CREATININE 0.81 0.86 0.80 1.08* 1.10*  CALCIUM 8.1* 8.1* 8.0* 8.2* 8.2*   GFR: Estimated Creatinine  Clearance: 69.6 mL/min (A) (by C-G formula based on SCr of 1.1 mg/dL (H)). Liver Function Tests: Recent Labs  Lab 07/17/17 1216  AST 36  ALT 42  ALKPHOS 313*  BILITOT 0.7  PROT 7.2  ALBUMIN 2.7*   No results for input(s): LIPASE, AMYLASE in the last 168 hours. No results for input(s): AMMONIA in the last 168 hours. Coagulation Profile: No results for input(s): INR, PROTIME in the last 168 hours. Cardiac Enzymes: No results for input(s): CKTOTAL, CKMB, CKMBINDEX, TROPONINI in the last 168 hours. BNP (last 3 results) No results for input(s): PROBNP in the last 8760 hours. HbA1C: No results for input(s): HGBA1C in the last 72 hours. CBG: Recent Labs  Lab 07/22/17 0802 07/22/17 1137 07/22/17 1618 07/22/17 2138 07/23/17 0804  GLUCAP 182* 232* 155* 190* 148*   Lipid Profile: No results for input(s): CHOL, HDL, LDLCALC, TRIG, CHOLHDL, LDLDIRECT in the last 72 hours. Thyroid Function Tests: No results for input(s): TSH, T4TOTAL, FREET4, T3FREE, THYROIDAB in the last 72 hours. Anemia Panel: No results for input(s): VITAMINB12, FOLATE, FERRITIN, TIBC, IRON, RETICCTPCT in the last 72 hours. Sepsis Labs: No results for input(s): PROCALCITON, LATICACIDVEN in the last 168 hours.  Recent Results (from the past 240 hour(s))  Blood culture (routine x 2)     Status: None   Collection Time: 07/17/17  2:39 PM  Result Value Ref Range Status   Specimen Description RIGHT ANTECUBITAL  Final   Special Requests   Final    BOTTLES DRAWN AEROBIC AND ANAEROBIC Blood Culture adequate volume   Culture NO GROWTH 5 DAYS  Final   Report Status 07/22/2017 FINAL  Final  Blood culture (routine x 2)     Status: None   Collection Time: 07/17/17  2:39 PM  Result Value Ref Range Status   Specimen Description LEFT ANTECUBITAL  Final   Special Requests   Final    BOTTLES DRAWN AEROBIC ONLY Blood Culture adequate volume   Culture NO GROWTH 5 DAYS  Final   Report Status 07/22/2017 FINAL  Final  Surgical  pcr screen     Status: None   Collection Time: 07/18/17  8:23 PM  Result Value Ref Range Status   MRSA, PCR NEGATIVE NEGATIVE Final   Staphylococcus aureus NEGATIVE NEGATIVE Final    Comment: (NOTE) The Xpert SA Assay (FDA approved for NASAL specimens in patients 52 years of age and older), is one component of a comprehensive surveillance program. It is not intended to diagnose infection nor to guide or monitor treatment.   Aerobic/Anaerobic Culture (surgical/deep wound)     Status: None (Preliminary result)   Collection Time: 07/21/17  2:52 PM  Result Value Ref Range Status   Specimen Description WOUND  Final   Special Requests NONE  Final   Gram Stain   Final    NO WBC  SEEN NO SQUAMOUS EPITHELIAL CELLS SEEN FEW GRAM POSITIVE COCCI IN PAIRS    Culture   Final    NO GROWTH < 12 HOURS Performed at [facility], [place] [address] [place], [place] [address]    Report Status PENDING  Incomplete    Radiology Studies: No results found.  Scheduled Meds: . carvedilol  6.25 mg Oral BID WC  . enoxaparin (LOVENOX) injection  40 mg Subcutaneous Q24H  . hydrALAZINE  50 mg Oral TID  . insulin aspart  0-15 Units Subcutaneous TID WC  . insulin aspart  0-5 Units Subcutaneous QHS  . sodium chloride flush  3 mL Intravenous Q12H   Continuous Infusions: . sodium chloride 250 mL (07/21/17 2015)  . piperacillin-tazobactam (ZOSYN)  IV Stopped (07/23/17 6950)  . vancomycin Stopped (07/23/17 0030)     LOS: 6 days    Time spent: 46mins  Irwin Brakeman, MD [facility] Pager [phone]  If 7PM-7AM, please contact night-coverage [email or web address] Password [email or web address] 07/23/2017, 9:29 AM

## 2017-07-23 NOTE — Progress Notes (Signed)
AVS reviewed with patient.  Verbalized understanding of discharge instructions, physician follow-up, medications.  [facility] to follow patient at home for wound care.  Patient transported by NT via w/c to main entrance at discharge.  Patient stable at time of discharge.

## 2017-07-23 NOTE — Care Management Note (Signed)
Case Management Note  Patient Details  Name: ENIYA CANNADY MRN: [id number] Date of Birth: [birth year]    Expected Discharge Date:  07/23/17               Expected Discharge Plan:  [place]  In-House Referral:     Discharge planning Services  CM Consult  Post Acute Care Choice:  [place], Resumption of Svcs/PTA Provider Choice offered to:     DME Arranged:    DME Agency:     HH Arranged:    [place] Agency:     Status of Service:  In process, will continue to follow  If discussed at Long Length of Stay Meetings, dates discussed:    Additional Comments: Patient discharging home today with resumption for RN services provided by [facility] for wound care. Tim of [facility] notified and will obtain orders from chart.   Kaitlyn Skowron, Chauncey [place], RN 07/23/2017, 11:07 AM

## 2017-07-23 NOTE — Discharge Summary (Signed)
Physician Discharge Summary  Patient ID: Natalie Contreras MRN: [id number] DOB/AGE: 1965/10/10 51 y.o.  Admit date: 07/17/2017 Discharge date: 07/23/2017  Admission Diagnoses: Cellulitis with gangrene, left foot outpatient  Discharge Diagnoses: Same Principal Problem:   Cellulitis and abscess of foot Active Problems:   Diabetes mellitus with neuropathy ([place])   Pulmonary hypertension (HCC)   PAD (peripheral artery disease) (HCC)   Essential hypertension   Chronic diastolic CHF (congestive heart failure) (HCC)   Toe amputation status, left (HCC)   Gangrene of left foot ([place])   Discharged Condition: good  Hospital Course: Patient is a 51 year old insulin-dependent diabetic white female status post left second toe amputation in August of this year who presents with worsening cellulitis and gangrene of the remaining toes.  She was admitted to the hospital on 07/17/2017 for workup of bilateral lower extremity cellulitis and IV antibiotics.  Workup for DVT was negative.  She subsequently underwent a left transmetatarsal amputation of the foot excluding the great toe on 07/21/2017.  She tolerated surgery well.  Her postoperative course has been for the most part unremarkable.  Her diet was advanced without difficulty.  She does have anemia, but this is chronic in nature due to multiple medical issues.  There was no need for blood transfusion.  She is being discharged home on postoperative day 2 in good and stable condition.  Treatments: surgery: Transmetatarsal amputation of left foot on 07/21/2017  Discharge Exam: Blood pressure (!) 169/59, pulse 62, temperature 97.7 F (36.5 C), temperature source Oral, resp. rate 16, height 5\' 4"  (1.626 m), weight 212 lb 3.2 oz (96.3 kg), last menstrual period 12/16/2015, SpO2 99 %. General appearance: alert, cooperative and no distress Resp: clear to auscultation bilaterally Cardio: regular rate and rhythm, S1, S2 normal, no murmur, click, rub or  gallop Extremities: Left transmetatarsal amputation site healing well.  Xeroform packing removed.  Pulses present.  Pretibial edema still present.  Disposition: 01-Home or Self Care  Discharge Instructions    Diet Carb Modified   Complete by:  As directed    Increase activity slowly   Complete by:  As directed      Allergies as of 07/23/2017   No Known Allergies     Medication List    TAKE these medications   aspirin EC 81 MG tablet Take 81 mg by mouth daily.   carvedilol 6.25 MG tablet Commonly known as:  COREG TAKE (1) TABLET BY MOUTH TWICE DAILY.   gabapentin 300 MG capsule Commonly known as:  NEURONTIN Take 1 capsule (300 mg total) 3 (three) times daily by mouth.   glimepiride 2 MG tablet Commonly known as:  AMARYL Take 2 mg by mouth daily with breakfast.   hydrALAZINE 50 MG tablet Commonly known as:  APRESOLINE Take 1 tablet (50 mg total) by mouth 3 (three) times daily.   metFORMIN 500 MG tablet Commonly known as:  GLUCOPHAGE Take 500 mg by mouth 2 (two) times daily with a meal.   sulfamethoxazole-trimethoprim 400-80 MG tablet Commonly known as:  BACTRIM Take 1 tablet 2 (two) times daily by mouth.   torsemide 20 MG tablet Commonly known as:  DEMADEX Take 40 mg in the AM and 20 mg in the PM      Follow-up Information    [facility] Follow up.   Specialty:  [facility] Contact information: [address] [place] [address] [phone]        Aviva Signs, MD. Schedule an appointment as  soon as possible for a visit on 08/05/2017.   Specialty:  General Surgery Contact information: [address] [place] [address] [phone]           Signed: Aviva Signs 07/23/2017, 10:54 AM

## 2017-07-23 NOTE — Plan of Care (Signed)
Continue plan of care.

## 2017-07-28 LAB — AEROBIC/ANAEROBIC CULTURE W GRAM STAIN (SURGICAL/DEEP WOUND): Culture: NORMAL

## 2017-07-28 LAB — AEROBIC/ANAEROBIC CULTURE (SURGICAL/DEEP WOUND): GRAM STAIN: NONE SEEN

## 2017-07-31 ENCOUNTER — Ambulatory Visit: Payer: BLUE CROSS/BLUE SHIELD | Admitting: General Surgery

## 2017-07-31 ENCOUNTER — Encounter: Payer: Self-pay | Admitting: General Surgery

## 2017-07-31 ENCOUNTER — Ambulatory Visit (INDEPENDENT_AMBULATORY_CARE_PROVIDER_SITE_OTHER): Payer: BLUE CROSS/BLUE SHIELD | Admitting: General Surgery

## 2017-07-31 VITALS — BP 147/61 | HR 75 | Temp 97.5°F | Resp 18 | Ht 64.0 in | Wt 194.0 lb

## 2017-07-31 DIAGNOSIS — Z09 Encounter for follow-up examination after completed treatment for conditions other than malignant neoplasm: Secondary | ICD-10-CM

## 2017-07-31 NOTE — Progress Notes (Signed)
Subjective:     Natalie Contreras  Status post transmetatarsal amputation of left foot.  Doing well.  Home health is following patient. Objective:    BP (!) 147/61   Pulse 75   Temp (!) 97.5 F (36.4 C)   Resp 18   Ht 5\' 4"  (1.626 m)   Wt 194 lb (88 kg)   LMP 12/16/2015   BMI 33.30 kg/m   General:  alert, cooperative and no distress  Left foot incision healing well.  Sutures in place.  No purulent drainage noted.  No erythema noted.     Assessment:    Doing well postoperatively.    Plan:   Continue current wound care.  Follow-up on 08/12/2017.

## 2017-08-05 ENCOUNTER — Ambulatory Visit: Payer: BLUE CROSS/BLUE SHIELD | Admitting: General Surgery

## 2017-08-12 ENCOUNTER — Ambulatory Visit (INDEPENDENT_AMBULATORY_CARE_PROVIDER_SITE_OTHER): Payer: Self-pay | Admitting: General Surgery

## 2017-08-12 ENCOUNTER — Encounter: Payer: Self-pay | Admitting: General Surgery

## 2017-08-12 VITALS — BP 161/64 | HR 67 | Temp 97.5°F | Resp 18 | Ht 64.0 in | Wt 186.0 lb

## 2017-08-12 DIAGNOSIS — Z09 Encounter for follow-up examination after completed treatment for conditions other than malignant neoplasm: Secondary | ICD-10-CM

## 2017-08-12 NOTE — Progress Notes (Signed)
Subjective:     [name]  Status post left foot transmetatarsal amputation.  Doing well.  Still being seen by home health.  Is undergoing physical therapy. Objective:    BP (!) 161/64   Pulse 67   Temp (!) 97.5 F (36.4 C)   Resp 18   Ht 5\' 4"  (1.626 m)   Wt 186 lb (84.4 kg)   LMP 12/16/2015   BMI 31.93 kg/m   General:  alert, cooperative and no distress  Left foot incision line healing well.  Great toe appears healthy.  Sutures removed, Steri-Strips applied.     Assessment:    Doing well postoperatively.    Plan:   Continue keeping wound clean and dry.  Follow-up here in 2 weeks.

## 2017-08-26 ENCOUNTER — Ambulatory Visit: Payer: BLUE CROSS/BLUE SHIELD | Admitting: General Surgery

## 2017-08-28 ENCOUNTER — Ambulatory Visit: Payer: BLUE CROSS/BLUE SHIELD | Admitting: General Surgery

## 2017-09-02 ENCOUNTER — Encounter: Payer: Self-pay | Admitting: General Surgery

## 2017-09-02 ENCOUNTER — Ambulatory Visit (INDEPENDENT_AMBULATORY_CARE_PROVIDER_SITE_OTHER): Payer: BLUE CROSS/BLUE SHIELD | Admitting: General Surgery

## 2017-09-02 VITALS — BP 163/71 | HR 77 | Temp 97.5°F | Ht 64.0 in | Wt 211.0 lb

## 2017-09-02 DIAGNOSIS — Z09 Encounter for follow-up examination after completed treatment for conditions other than malignant neoplasm: Secondary | ICD-10-CM

## 2017-09-02 NOTE — Progress Notes (Signed)
Subjective:     Natalie Contreras  Has been progressing well.  Still seen by home health nurse for wound care.  States that her blood sugars have been running higher, but this is being addressed by her primary care physician. Objective:    BP (!) 163/71   Pulse 77   Temp (!) 97.5 F (36.4 C)   Ht 5\' 4"  (1.626 m)   Wt 211 lb (95.7 kg)   LMP 12/16/2015   BMI 36.22 kg/m   General:  alert, cooperative and no distress  Left foot transmetatarsal wound is healing well with minimal superficial opening along the medial aspect.  Silver nitrate applied to granulation tissue.  Left great toe looks good.     Assessment:    Doing well postoperatively.    Plan:   Wash left foot daily with soap and water.  May apply dry dressing.  Continue using orthopedic sandal.  Follow-up here in 3 weeks.

## 2017-09-23 ENCOUNTER — Encounter: Payer: Self-pay | Admitting: General Surgery

## 2017-09-23 ENCOUNTER — Ambulatory Visit (INDEPENDENT_AMBULATORY_CARE_PROVIDER_SITE_OTHER): Payer: Self-pay | Admitting: General Surgery

## 2017-09-23 ENCOUNTER — Ambulatory Visit: Payer: BLUE CROSS/BLUE SHIELD | Admitting: General Surgery

## 2017-09-23 VITALS — BP 140/63 | HR 69 | Temp 97.5°F | Ht 64.0 in | Wt 192.0 lb

## 2017-09-23 DIAGNOSIS — Z09 Encounter for follow-up examination after completed treatment for conditions other than malignant neoplasm: Secondary | ICD-10-CM

## 2017-09-23 NOTE — Progress Notes (Signed)
Subjective:     Natalie Contreras  Doing well.  Home health is coming out twice a week for wound check and diabetes care. Objective:    BP 140/63   Pulse 69   Temp (!) 97.5 F (36.4 C)   Ht 5\' 4"  (1.626 m)   Wt 192 lb (87.1 kg)   LMP 12/16/2015   BMI 32.96 kg/m   General:  alert, cooperative and no distress  Left foot transmetatarsal amputation healing well with small area of granuloma along the lateral aspect.  Silver nitrate applied.  No swelling of the foot noted.  No purulent drainage noted.     Assessment:    Doing well postoperatively.    Plan:   May have left lower extremity wrapped to help prevent swelling.  Follow-up here in 3 weeks.

## 2017-10-16 ENCOUNTER — Encounter: Payer: Self-pay | Admitting: General Surgery

## 2017-10-16 ENCOUNTER — Ambulatory Visit (INDEPENDENT_AMBULATORY_CARE_PROVIDER_SITE_OTHER): Payer: Self-pay | Admitting: General Surgery

## 2017-10-16 VITALS — BP 122/56 | HR 67 | Temp 97.5°F | Ht 64.0 in | Wt 185.0 lb

## 2017-10-16 DIAGNOSIS — Z09 Encounter for follow-up examination after completed treatment for conditions other than malignant neoplasm: Secondary | ICD-10-CM

## 2017-10-16 NOTE — Progress Notes (Signed)
Subjective:     Natalie Contreras  Here for wound check.  Denies any drainage.  Slight burning in the foot. Objective:    BP (!) 122/56   Pulse 67   Temp (!) 97.5 F (36.4 C)   Ht 5\' 4"  (1.626 m)   Wt 185 lb (83.9 kg)   LMP 12/16/2015   BMI 31.76 kg/m   General:  alert, cooperative and no distress  Left foot with small 3 cm superficial wound separation without purulent drainage.  Granulation tissue noted at the base.  Silver nitrate applied.     Assessment:    Slowly healing wound, status post transmetatarsal amputation of left foot.    Plan:  Continue keeping wound clean and dry with soap and water.  Follow-up in 1 month.

## 2017-11-10 ENCOUNTER — Other Ambulatory Visit: Payer: Self-pay | Admitting: Cardiology

## 2017-11-13 ENCOUNTER — Encounter: Payer: Self-pay | Admitting: General Surgery

## 2017-11-13 ENCOUNTER — Other Ambulatory Visit: Payer: Self-pay | Admitting: General Surgery

## 2017-11-13 ENCOUNTER — Ambulatory Visit (INDEPENDENT_AMBULATORY_CARE_PROVIDER_SITE_OTHER): Payer: BLUE CROSS/BLUE SHIELD | Admitting: General Surgery

## 2017-11-13 VITALS — BP 159/69 | HR 81 | Temp 96.9°F | Ht 64.0 in | Wt 181.0 lb

## 2017-11-13 DIAGNOSIS — I739 Peripheral vascular disease, unspecified: Secondary | ICD-10-CM

## 2017-11-13 DIAGNOSIS — E1051 Type 1 diabetes mellitus with diabetic peripheral angiopathy without gangrene: Secondary | ICD-10-CM

## 2017-11-13 NOTE — Progress Notes (Signed)
Subjective:     [name]  Patient presents with a new blister that has developed over the left foot.  She has had clear yellow drainage from it.  She denies any pain, but she has a peripheral neuropathy.  The previous transmetatarsal incision site has healed. Objective:    BP (!) 159/69   Pulse 81   Temp (!) 96.9 F (36.1 C)   Ht 5\' 4"  (1.626 m)   Wt 181 lb (82.1 kg)   LMP 12/16/2015   BMI 31.07 kg/m   General:  alert, cooperative and no distress  Left foot with amputation of multiple toes except great toe.  Incision has healed.  Superficial skin loss is noted along the dorsum of the left foot, just distal to the ankle.  Difficult to palpate dorsalis pedis or posterior tibial pulses.  Dry skin is present.  No other cyanosis is noted.    Previous arterial segmental Dopplers reviewed from 2017. Assessment:    Type 1 diabetes mellitus with peripheral vascular disease, newly formed skin loss on left foot    Plan:  Did offer referral to vascular surgery, but patient cannot make it to [facility] due to transportation issues.  Will get repeat segmental Doppler studies.  Triple antibiotic ointment applied twice daily to ulceration.  Follow-up here in 2 weeks.

## 2017-11-17 ENCOUNTER — Ambulatory Visit (HOSPITAL_COMMUNITY)
Admission: RE | Admit: 2017-11-17 | Discharge: 2017-11-17 | Disposition: A | Discharge status: Home / Self Care | Payer: BLUE CROSS/BLUE SHIELD | Source: Ambulatory Visit | Attending: General Surgery | Admitting: General Surgery

## 2017-11-17 DIAGNOSIS — E1051 Type 1 diabetes mellitus with diabetic peripheral angiopathy without gangrene: Secondary | ICD-10-CM | POA: Diagnosis present

## 2017-11-17 DIAGNOSIS — I739 Peripheral vascular disease, unspecified: Secondary | ICD-10-CM

## 2017-11-27 ENCOUNTER — Ambulatory Visit: Payer: BLUE CROSS/BLUE SHIELD | Admitting: General Surgery

## 2017-11-27 ENCOUNTER — Encounter: Payer: Self-pay | Admitting: General Surgery

## 2017-11-27 VITALS — BP 149/65 | HR 75 | Temp 96.8°F | Ht 64.0 in | Wt 192.0 lb

## 2017-11-27 DIAGNOSIS — I739 Peripheral vascular disease, unspecified: Secondary | ICD-10-CM

## 2017-11-27 NOTE — Progress Notes (Signed)
Subjective:     [name]  Here for follow-up wound check and review of her recent arterial segmental Doppler.  Patient states the wound on the top of her left foot has decreased in size and is not causing her pain.  No drainage has been noted. Objective:    BP (!) 149/65   Pulse 75   Temp (!) 96.8 F (36 C)   Ht 5\' 4"  (1.626 m)   Wt 192 lb (87.1 kg)   LMP 12/16/2015   BMI 32.96 kg/m   General:  alert, cooperative and no distress  Left foot with smaller wound noted on the top of the foot.  No drainage is noted.  Less erythema noted.  Amputation site well-healed. Vascular ABI reviewed.     Assessment:    Left foot wound improving.  No need for acute surgical intervention at this time.    Plan:   Will refer to Dr. Donnetta Hutching of Vascular surgery for further evaluation and treatment.  Patient may need CTA to further evaluate her vascular disease.  Follow-up here as needed.

## 2018-01-13 ENCOUNTER — Encounter: Payer: BLUE CROSS/BLUE SHIELD | Admitting: Vascular Surgery

## 2018-07-29 ENCOUNTER — Other Ambulatory Visit (HOSPITAL_COMMUNITY): Payer: Self-pay | Admitting: Internal Medicine

## 2018-07-29 DIAGNOSIS — Z1231 Encounter for screening mammogram for malignant neoplasm of breast: Secondary | ICD-10-CM

## 2019-06-13 ENCOUNTER — Inpatient Hospital Stay (HOSPITAL_COMMUNITY)
Admission: EM | Admit: 2019-06-13 | Discharge: 2019-06-15 | DRG: 603 | Disposition: A | Discharge status: Home / Self Care | Payer: Medicaid Other | Attending: Family Medicine | Admitting: Family Medicine

## 2019-06-13 ENCOUNTER — Other Ambulatory Visit: Payer: Self-pay

## 2019-06-13 ENCOUNTER — Emergency Department (HOSPITAL_COMMUNITY): Payer: Medicaid Other

## 2019-06-13 ENCOUNTER — Encounter (HOSPITAL_COMMUNITY): Payer: Self-pay

## 2019-06-13 DIAGNOSIS — T148XXA Other injury of unspecified body region, initial encounter: Secondary | ICD-10-CM | POA: Diagnosis not present

## 2019-06-13 DIAGNOSIS — I5032 Chronic diastolic (congestive) heart failure: Secondary | ICD-10-CM | POA: Diagnosis present

## 2019-06-13 DIAGNOSIS — L089 Local infection of the skin and subcutaneous tissue, unspecified: Secondary | ICD-10-CM | POA: Diagnosis present

## 2019-06-13 DIAGNOSIS — L03115 Cellulitis of right lower limb: Secondary | ICD-10-CM

## 2019-06-13 DIAGNOSIS — Z7984 Long term (current) use of oral hypoglycemic drugs: Secondary | ICD-10-CM

## 2019-06-13 DIAGNOSIS — E1140 Type 2 diabetes mellitus with diabetic neuropathy, unspecified: Secondary | ICD-10-CM | POA: Diagnosis present

## 2019-06-13 DIAGNOSIS — S91301A Unspecified open wound, right foot, initial encounter: Secondary | ICD-10-CM

## 2019-06-13 DIAGNOSIS — E1151 Type 2 diabetes mellitus with diabetic peripheral angiopathy without gangrene: Secondary | ICD-10-CM | POA: Diagnosis present

## 2019-06-13 DIAGNOSIS — Z7982 Long term (current) use of aspirin: Secondary | ICD-10-CM

## 2019-06-13 DIAGNOSIS — Z801 Family history of malignant neoplasm of trachea, bronchus and lung: Secondary | ICD-10-CM

## 2019-06-13 DIAGNOSIS — Z20828 Contact with and (suspected) exposure to other viral communicable diseases: Secondary | ICD-10-CM | POA: Diagnosis present

## 2019-06-13 DIAGNOSIS — N179 Acute kidney failure, unspecified: Secondary | ICD-10-CM | POA: Diagnosis present

## 2019-06-13 DIAGNOSIS — F17210 Nicotine dependence, cigarettes, uncomplicated: Secondary | ICD-10-CM | POA: Diagnosis present

## 2019-06-13 DIAGNOSIS — I10 Essential (primary) hypertension: Secondary | ICD-10-CM | POA: Diagnosis present

## 2019-06-13 DIAGNOSIS — I5033 Acute on chronic diastolic (congestive) heart failure: Secondary | ICD-10-CM | POA: Diagnosis present

## 2019-06-13 DIAGNOSIS — I110 Hypertensive heart disease with heart failure: Secondary | ICD-10-CM | POA: Diagnosis present

## 2019-06-13 DIAGNOSIS — E11628 Type 2 diabetes mellitus with other skin complications: Secondary | ICD-10-CM | POA: Diagnosis present

## 2019-06-13 LAB — SARS CORONAVIRUS 2 BY RT PCR (HOSPITAL ORDER, PERFORMED IN ~~LOC~~ HOSPITAL LAB): SARS Coronavirus 2: NEGATIVE

## 2019-06-13 LAB — BASIC METABOLIC PANEL
Anion gap: 11 (ref 5–15)
BUN: 44 mg/dL — ABNORMAL HIGH (ref 6–20)
CO2: 24 mmol/L (ref 22–32)
Calcium: 8.8 mg/dL — ABNORMAL LOW (ref 8.9–10.3)
Chloride: 102 mmol/L (ref 98–111)
Creatinine, Ser: 1.28 mg/dL — ABNORMAL HIGH (ref 0.44–1.00)
GFR calc Af Amer: 56 mL/min — ABNORMAL LOW (ref >60)
GFR calc non Af Amer: 48 mL/min — ABNORMAL LOW (ref >60)
Glucose, Bld: 192 mg/dL — ABNORMAL HIGH (ref 70–99)
Potassium: 3.9 mmol/L (ref 3.5–5.1)
Sodium: 137 mmol/L (ref 135–145)

## 2019-06-13 LAB — MRSA PCR SCREENING: MRSA by PCR: NEGATIVE

## 2019-06-13 LAB — CBC WITH DIFFERENTIAL/PLATELET
Abs Immature Granulocytes: 0.02 10*3/uL (ref 0.00–0.07)
Basophils Absolute: 0.1 10*3/uL (ref 0.0–0.1)
Basophils Relative: 1 %
Eosinophils Absolute: 0.2 10*3/uL (ref 0.0–0.5)
Eosinophils Relative: 2 %
HCT: 43.5 % (ref 36.0–46.0)
Hemoglobin: 13.5 g/dL (ref 12.0–15.0)
Immature Granulocytes: 0 %
Lymphocytes Relative: 20 %
Lymphs Abs: 2 10*3/uL (ref 0.7–4.0)
MCH: 27.9 pg (ref 26.0–34.0)
MCHC: 31 g/dL (ref 30.0–36.0)
MCV: 89.9 fL (ref 80.0–100.0)
Monocytes Absolute: 0.5 10*3/uL (ref 0.1–1.0)
Monocytes Relative: 5 %
Neutro Abs: 7 10*3/uL (ref 1.7–7.7)
Neutrophils Relative %: 72 %
Platelets: 339 10*3/uL (ref 150–400)
RBC: 4.84 MIL/uL (ref 3.87–5.11)
RDW: 15.6 % — ABNORMAL HIGH (ref 11.5–15.5)
WBC: 9.8 10*3/uL (ref 4.0–10.5)
nRBC: 0 % (ref 0.0–0.2)

## 2019-06-13 LAB — GLUCOSE, CAPILLARY
Glucose-Capillary: 196 mg/dL — ABNORMAL HIGH (ref 70–99)
Glucose-Capillary: 293 mg/dL — ABNORMAL HIGH (ref 70–99)

## 2019-06-13 IMAGING — DX DG FOOT COMPLETE 3+V*R*
3 series · 3 of 3 positions shown · non-contrast
Comparison: [DATE]

CLINICAL DATA: Diabetic foot wound.

EXAM:
RIGHT FOOT COMPLETE - 3+ VIEW

[foot ap]
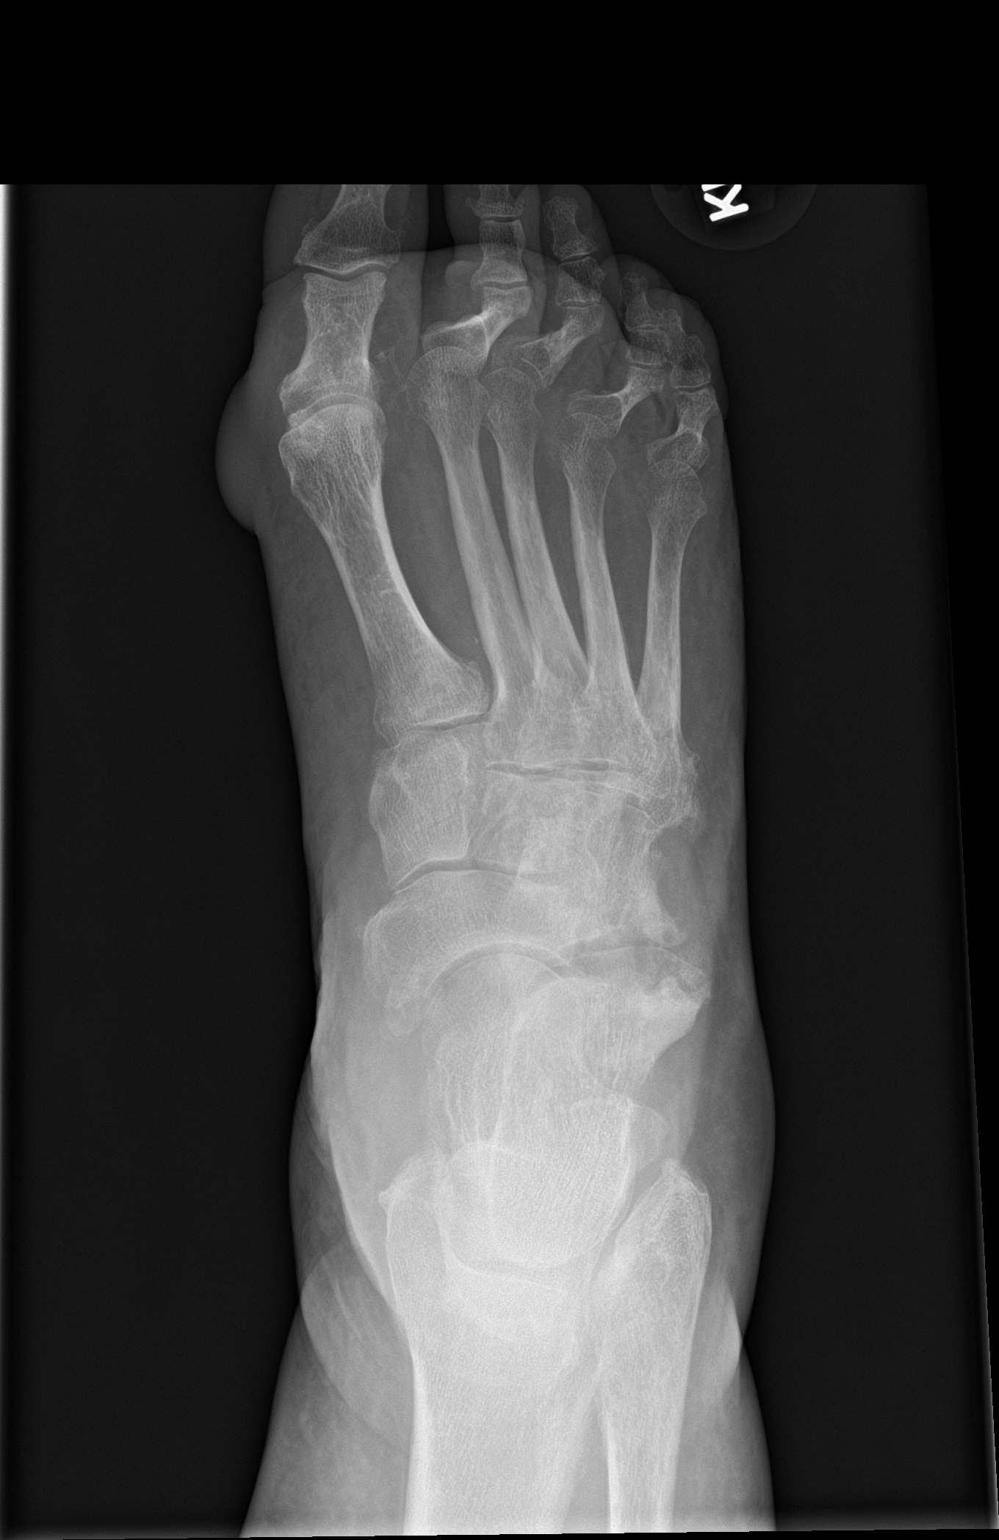

[foot obl]
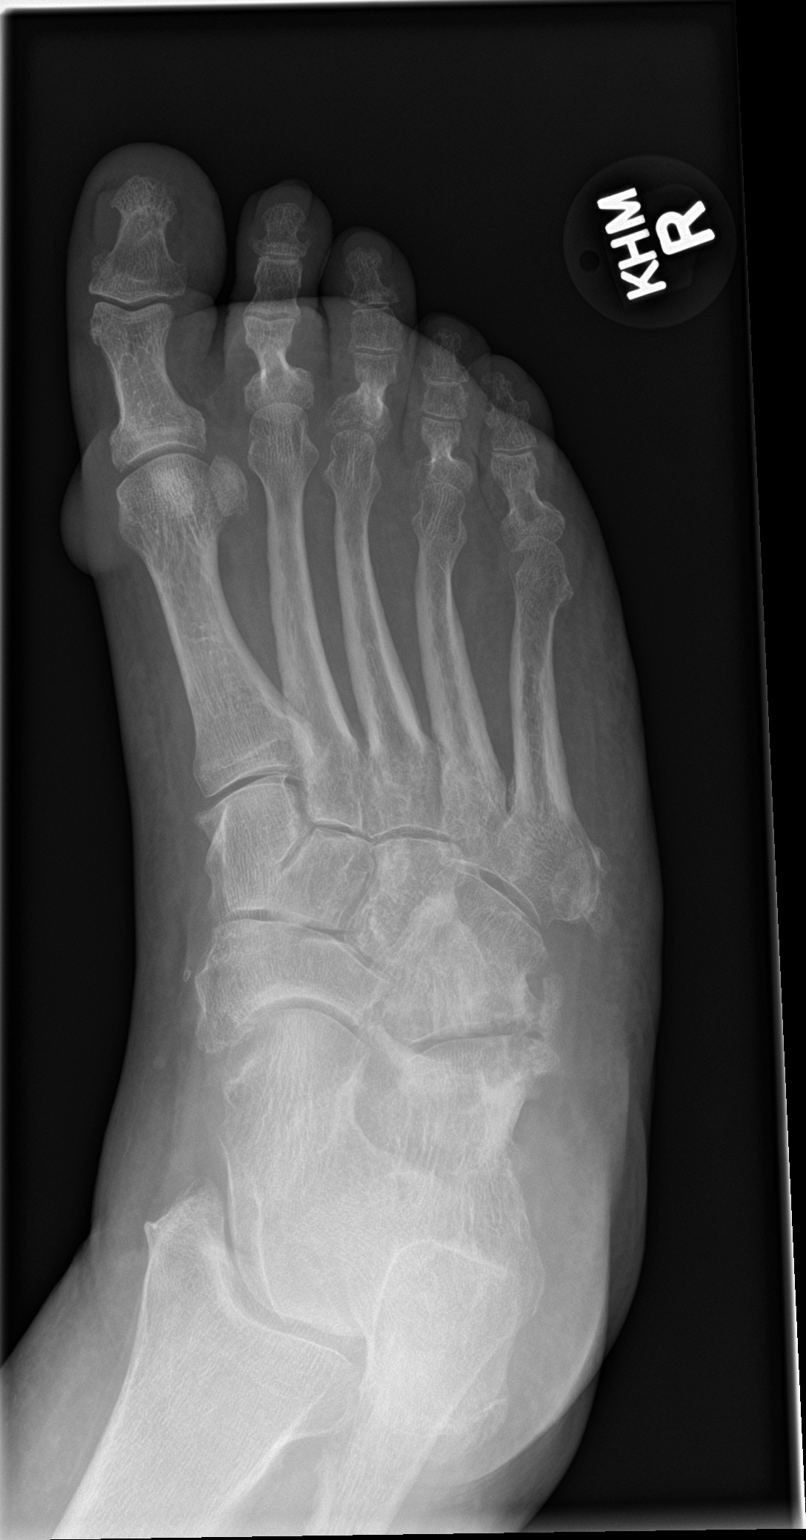

[foot lat]
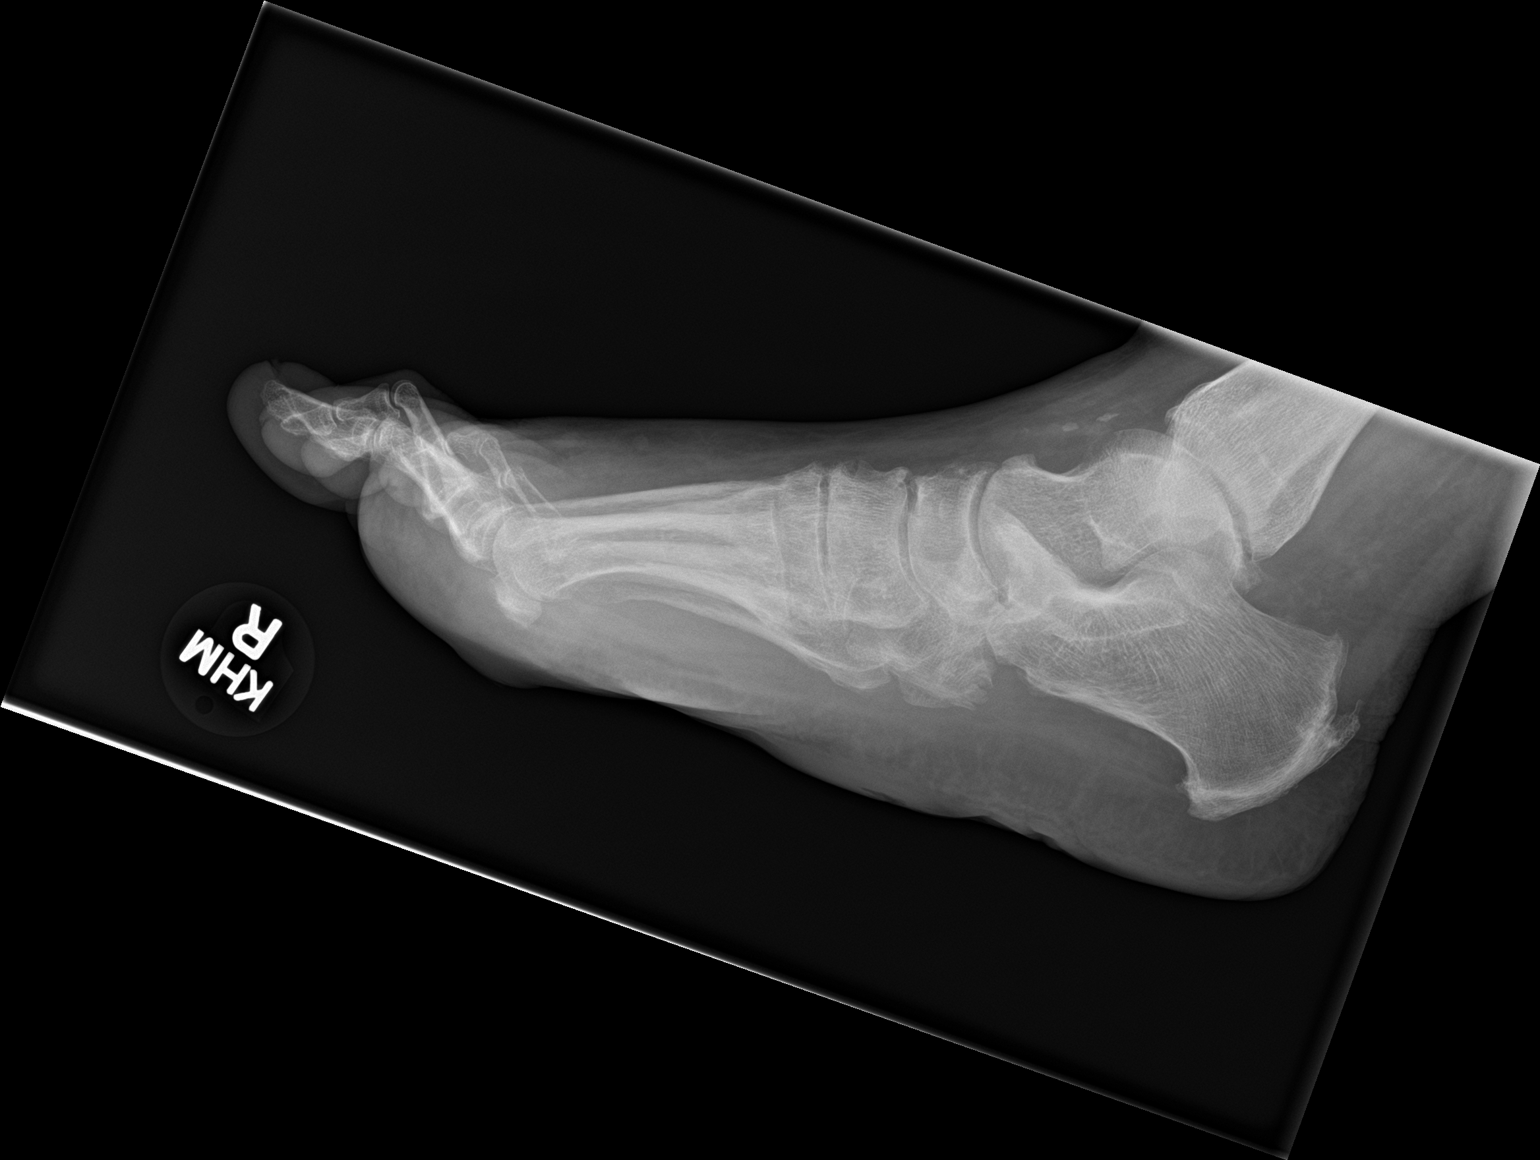

[3 of 3 positions shown; findings below may reference images not displayed]

FINDINGS: There is a large skin blister noted along the medial aspect of the
first MTP joint. No definite plain film findings for septic
arthritis or osteomyelitis.

There are moderate degenerative changes involving the midfoot with
joint space narrowing and spurring, most notably at the
calcaneocuboid joint. Spurring changes from the calcaneus are noted
near the Achilles and plantar fascia attachment.

Small vessel calcifications are noted consistent with patient's
diabetes.
IMPRESSION: Large skin blister noted along the medial aspect of the first MTP
joint.

No definite plain film findings to suggest septic arthritis or
osteomyelitis.

Age advanced degenerative changes.

## 2019-06-13 MED ORDER — CARVEDILOL 3.125 MG PO TABS
6.2500 mg | ORAL_TABLET | Freq: Two times a day (BID) | ORAL | Status: DC
  Administered 2019-06-13 – 2019-06-15 (×4): 6.25 mg via ORAL
  Filled 2019-06-13 (×4): qty 2

## 2019-06-13 MED ORDER — TRAZODONE HCL 50 MG PO TABS: 50.0000 mg | ORAL_TABLET | Freq: Every evening | ORAL | Status: DC | PRN

## 2019-06-13 MED ORDER — SODIUM CHLORIDE 0.9% FLUSH: 3.0000 mL | INTRAVENOUS | Status: DC | PRN

## 2019-06-13 MED ORDER — PIPERACILLIN-TAZOBACTAM 3.375 G IVPB 30 MIN
3.3750 g | Freq: Once | INTRAVENOUS | Status: AC
  Administered 2019-06-13: 3.375 g via INTRAVENOUS
  Filled 2019-06-13: qty 50

## 2019-06-13 MED ORDER — HYDRALAZINE HCL 10 MG PO TABS: 5.0000 mg | ORAL_TABLET | Freq: Two times a day (BID) | ORAL | Status: DC

## 2019-06-13 MED ORDER — ACETAMINOPHEN 325 MG PO TABS: 650.0000 mg | ORAL_TABLET | Freq: Four times a day (QID) | ORAL | Status: DC | PRN

## 2019-06-13 MED ORDER — VANCOMYCIN HCL IN DEXTROSE 1-5 GM/200ML-% IV SOLN
1000.0000 mg | Freq: Once | INTRAVENOUS | Status: AC
  Administered 2019-06-13: 1000 mg via INTRAVENOUS
  Filled 2019-06-13: qty 200

## 2019-06-13 MED ORDER — SODIUM CHLORIDE 0.9 % IV SOLN
250.0000 mL | INTRAVENOUS | Status: DC | PRN
  Administered 2019-06-13: 250 mL via INTRAVENOUS

## 2019-06-13 MED ORDER — HYDRALAZINE HCL 25 MG PO TABS
50.0000 mg | ORAL_TABLET | Freq: Three times a day (TID) | ORAL | Status: DC
  Administered 2019-06-13 – 2019-06-15 (×6): 50 mg via ORAL
  Filled 2019-06-13 (×7): qty 2

## 2019-06-13 MED ORDER — METFORMIN HCL 500 MG PO TABS
1000.0000 mg | ORAL_TABLET | Freq: Every day | ORAL | Status: DC
  Administered 2019-06-14 – 2019-06-15 (×2): 1000 mg via ORAL
  Filled 2019-06-13 (×2): qty 2

## 2019-06-13 MED ORDER — INSULIN ASPART 100 UNIT/ML ~~LOC~~ SOLN
0.0000 [IU] | Freq: Every day | SUBCUTANEOUS | Status: DC
  Administered 2019-06-13: 3 [IU] via SUBCUTANEOUS
  Administered 2019-06-14: 2 [IU] via SUBCUTANEOUS

## 2019-06-13 MED ORDER — SIMVASTATIN 20 MG PO TABS
40.0000 mg | ORAL_TABLET | Freq: Every day | ORAL | Status: DC
  Administered 2019-06-14: 40 mg via ORAL
  Filled 2019-06-13 (×2): qty 2

## 2019-06-13 MED ORDER — ALBUTEROL SULFATE (2.5 MG/3ML) 0.083% IN NEBU: 2.5000 mg | INHALATION_SOLUTION | RESPIRATORY_TRACT | Status: DC | PRN

## 2019-06-13 MED ORDER — TORSEMIDE 20 MG PO TABS
20.0000 mg | ORAL_TABLET | Freq: Every day | ORAL | Status: DC
  Filled 2019-06-13: qty 1

## 2019-06-13 MED ORDER — GABAPENTIN 300 MG PO CAPS
300.0000 mg | ORAL_CAPSULE | Freq: Three times a day (TID) | ORAL | Status: DC
  Administered 2019-06-13 – 2019-06-15 (×6): 300 mg via ORAL
  Filled 2019-06-13 (×6): qty 1

## 2019-06-13 MED ORDER — INSULIN DETEMIR 100 UNIT/ML ~~LOC~~ SOLN
35.0000 [IU] | Freq: Every day | SUBCUTANEOUS | Status: DC
  Administered 2019-06-13 – 2019-06-14 (×2): 35 [IU] via SUBCUTANEOUS
  Filled 2019-06-13 (×3): qty 0.35

## 2019-06-13 MED ORDER — INSULIN ASPART 100 UNIT/ML ~~LOC~~ SOLN
0.0000 [IU] | Freq: Three times a day (TID) | SUBCUTANEOUS | Status: DC
  Administered 2019-06-13 – 2019-06-14 (×3): 2 [IU] via SUBCUTANEOUS
  Administered 2019-06-14: 3 [IU] via SUBCUTANEOUS
  Administered 2019-06-15 (×2): 2 [IU] via SUBCUTANEOUS

## 2019-06-13 MED ORDER — GLIMEPIRIDE 2 MG PO TABS
2.0000 mg | ORAL_TABLET | Freq: Every day | ORAL | Status: DC
  Administered 2019-06-14 – 2019-06-15 (×2): 2 mg via ORAL
  Filled 2019-06-13 (×2): qty 1

## 2019-06-13 MED ORDER — ASPIRIN EC 81 MG PO TBEC
81.0000 mg | DELAYED_RELEASE_TABLET | Freq: Every day | ORAL | Status: DC
  Administered 2019-06-14 – 2019-06-15 (×2): 81 mg via ORAL
  Filled 2019-06-13 (×2): qty 1

## 2019-06-13 MED ORDER — ONDANSETRON HCL 4 MG/2ML IJ SOLN: 4.0000 mg | Freq: Four times a day (QID) | INTRAMUSCULAR | Status: DC | PRN

## 2019-06-13 MED ORDER — ACETAMINOPHEN 650 MG RE SUPP: 650.0000 mg | Freq: Four times a day (QID) | RECTAL | Status: DC | PRN

## 2019-06-13 MED ORDER — POLYETHYLENE GLYCOL 3350 17 G PO PACK: 17.0000 g | PACK | Freq: Every day | ORAL | Status: DC | PRN

## 2019-06-13 MED ORDER — LABETALOL HCL 5 MG/ML IV SOLN: 10.0000 mg | INTRAVENOUS | Status: DC | PRN

## 2019-06-13 MED ORDER — HEPARIN SODIUM (PORCINE) 5000 UNIT/ML IJ SOLN
5000.0000 [IU] | Freq: Three times a day (TID) | INTRAMUSCULAR | Status: DC
  Administered 2019-06-13 – 2019-06-15 (×5): 5000 [IU] via SUBCUTANEOUS
  Filled 2019-06-13 (×4): qty 1

## 2019-06-13 MED ORDER — SODIUM CHLORIDE 0.9 % IV SOLN
1.0000 g | INTRAVENOUS | Status: DC
  Administered 2019-06-13 – 2019-06-14 (×2): 1 g via INTRAVENOUS
  Filled 2019-06-13 (×2): qty 10

## 2019-06-13 MED ORDER — ONDANSETRON HCL 4 MG PO TABS: 4.0000 mg | ORAL_TABLET | Freq: Four times a day (QID) | ORAL | Status: DC | PRN

## 2019-06-13 NOTE — ED Notes (Signed)
From Rad 

## 2019-06-13 NOTE — ED Provider Notes (Signed)
[facility] EMERGENCY DEPARTMENT Provider Note   CSN: [id number] Arrival date & time: 06/13/19  1007     History   Chief Complaint Chief Complaint  Patient presents with  . Wound Infection    HPI Natalie Contreras is a 53 y.o. female.     HPI   Natalie Contreras is a 52 y.o. female, with a history of CHF, DM (poorly controlled), HTN, PAD, presenting to the ED with a wound to the bottom of her right big toe noted Thursday, September 24.  She states, "I felt something squishy while I was walking."  She has intermittent throbbing mild to moderate pain with occasional shooting pain into the foot. She also notes another wound to the plantar surface of the right foot that she had not previously noted. Yesterday, noted painful redness spreading along the dorsal right foot. She adds she had a wound that occurred to the left foot that was allowed to progress without treatment because her neuropathy prevented her from feeling a large amount of pain.  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 Has tried OTC ABX cream. The soonest she can follow-up with her PCP is Friday, October 2. Denies fever/chills, wound drainage, noted injuries, acute numbness, weakness, joint pain, or any other complaints.     Past Medical History:  Diagnosis Date  . Acute diastolic CHF (congestive heart failure) ([place]) 10/28/2016  . Carotid stenosis, asymptomatic, bilateral 01/15/2017  . Diabetes mellitus without complication ([place])   . Heart murmur   . Hypertension   . PAD (peripheral artery disease) ([place]) 04/10/2017   Left ABI 0.74. Right ABI 0.96.    Patient Active Problem List   Diagnosis Date Noted  . Wound infection 06/13/2019  . Cellulitis 07/17/2017  . Pyogenic inflammation of bone ([place])   . Toe amputation status, left 05/05/2017  . Gangrene of left foot ([place])   . PAD (peripheral artery disease) ([place]) 04/10/2017  . Essential hypertension 04/10/2017  . Chronic  diastolic CHF (congestive heart failure) ([place]) 04/10/2017  . AKI (acute kidney injury) ([place]) 04/10/2017  . Foreign body in left foot   . Cellulitis and abscess of foot 04/07/2017  . Carotid stenosis, asymptomatic, bilateral 01/15/2017  . Visual disturbance 01/15/2017  . Tobacco abuse 01/15/2017  . Acute respiratory failure with hypoxia ([place]) 10/30/2016  . Pulmonary hypertension ([place]) 10/29/2016  . Acute diastolic CHF (congestive heart failure) ([place]) 10/28/2016  . Benign essential HTN 10/28/2016  . Hyperlipidemia 10/28/2016  . Diabetes mellitus with neuropathy ([place]) 10/28/2016    Past Surgical History:  Procedure Laterality Date  . AMPUTATION TOE Left 05/05/2017   Procedure: LEFT SECOND TOE AMPUTATION;  Surgeon: Aviva Signs, MD;  Location: [facility] ORS;  Service: General;  Laterality: Left;  . CATARACT EXTRACTION W/PHACO Left 02/08/2016   Procedure: CATARACT EXTRACTION PHACO AND INTRAOCULAR LENS PLACEMENT (IOC);  Surgeon: Tonny Branch, MD;  Location: [facility] ORS;  Service: Ophthalmology;  Laterality: Left;  CDE 11.43   . CATARACT EXTRACTION W/PHACO Right 02/26/2016   Procedure: CATARACT EXTRACTION PHACO AND INTRAOCULAR LENS PLACEMENT RIGHT; CDE:  16.90;  Surgeon: Tonny Branch, MD;  Location: [facility] ORS;  Service: Ophthalmology;  Laterality: Right;  . CHOLECYSTECTOMY    . FOREIGN BODY REMOVAL Left 04/09/2017   Procedure: FOREIGN BODY REMOVAL ADULT FOOT;  Surgeon: Aviva Signs, MD;  Location: [facility] ORS;  Service: General;  Laterality: Left;  . TRANSMETATARSAL AMPUTATION Left 07/21/2017   Procedure: TRANSMETATARSAL AMPUTATION LEFT FOOT;  Surgeon: Arnoldo Morale,  Elta Guadeloupe, MD;  Location: [facility];  Service: General;  Laterality: Left;     OB History    Gravida      Para      Term      Preterm      AB      Living  2     SAB      TAB      Ectopic      Multiple      Live Births               Home Medications    Prior to Admission medications   Medication Sig Start Date End Date Taking? Authorizing  Provider  aspirin EC 81 MG tablet Take 81 mg by mouth daily.    [provider]  carvedilol (COREG) 6.25 MG tablet TAKE (1) TABLET BY MOUTH TWICE DAILY. 11/10/17   Arnoldo Lenis, MD  gabapentin (NEURONTIN) 300 MG capsule Take 1 capsule (300 mg total) 3 (three) times daily by mouth. 07/23/17 07/23/18  Aviva Signs, MD  glimepiride (AMARYL) 2 MG tablet Take 2 mg by mouth daily with breakfast.  03/15/17   [provider]  hydrALAZINE (APRESOLINE) 50 MG tablet Take 1 tablet (50 mg total) by mouth 3 (three) times daily. 01/15/17 07/17/17  Imogene Burn, PA-C  metFORMIN (GLUCOPHAGE) 500 MG tablet Take 500 mg by mouth 2 (two) times daily with a meal.    [provider]  sulfamethoxazole-trimethoprim (BACTRIM) 400-80 MG tablet Take 1 tablet 2 (two) times daily by mouth. 07/23/17   Aviva Signs, MD  torsemide (DEMADEX) 20 MG tablet Take 40 mg in the AM and 20 mg in the PM Patient not taking: Reported on 07/17/2017 02/07/17   Arnoldo Lenis, MD    Family History Family History  Problem Relation Age of Onset  . Lung cancer Mother   . Lung cancer Father   . AAA (abdominal aortic aneurysm) Brother     Social History Social History   Tobacco Use  . Smoking status: Current Every Day Smoker    Packs/day: 0.75    Years: 25.00    Pack years: 18.75    Types: Cigarettes    Start date: 09/16/1984  . Smokeless tobacco: Never Used  Substance Use Topics  . Alcohol use: No  . Drug use: No     Allergies   Patient has no known allergies.   Review of Systems Review of Systems  Constitutional: Negative for chills and fever.  Gastrointestinal: Negative for nausea and vomiting.  Musculoskeletal: Negative for arthralgias, joint swelling and myalgias.  Skin: Positive for color change and wound.  Neurological: Negative for weakness and numbness.  All other systems reviewed and are negative.    Physical Exam Updated Vital Signs BP (!) 161/63 (BP Location: Left Arm)    Pulse 78   Temp 97.9 F (36.6 C) (Oral)   Resp 17   Ht 5\' 4"  (1.626 m)   Wt 98.4 kg   LMP 12/16/2015   SpO2 99%   BMI 37.25 kg/m   Physical Exam Vitals signs and nursing note reviewed.  Constitutional:      General: She is not in acute distress.    Appearance: She is well-developed. She is not diaphoretic.  HENT:     Head: Normocephalic and atraumatic.     Mouth/Throat:     Mouth: Mucous membranes are moist.     Pharynx: Oropharynx is clear.  Eyes:     Conjunctiva/sclera: Conjunctivae  normal.  Neck:     Musculoskeletal: Neck supple.  Cardiovascular:     Rate and Rhythm: Normal rate and regular rhythm.     Pulses: Normal pulses.          Radial pulses are 2+ on the right side and 2+ on the left side.       Posterior tibial pulses are 2+ on the right side and 2+ on the left side.     Comments: Tactile temperature in the extremities appropriate and equal bilaterally. Pulmonary:     Effort: Pulmonary effort is normal. No respiratory distress.  Abdominal:     Palpations: Abdomen is soft.     Tenderness: There is no abdominal tenderness. There is no guarding.  Musculoskeletal:     Right lower leg: No edema.     Left lower leg: No edema.     Comments: Bullae soft, containing dark fluid, possibly blood to the right medial and plantar surface, as shown in the photos.  There appears to be an originating wound to the plantar surface.  No exudate noted.  There is streaking redness spreading from the wound and onto the dorsal surface of the foot.  The erythematous area of the foot is tender to the touch. No pain with range of motion of the toes or ankle. There is erythema noted to the right lower leg just superior to the ankle, however, patient states this is chronic and unchanged.  Patient has a firm, chronic appearing wound to the right plantar midfoot.  Nontender.  Lymphadenopathy:     Cervical: No cervical adenopathy.  Skin:    General: Skin is warm and dry.     Capillary  Refill: Capillary refill takes less than 2 seconds.  Neurological:     Mental Status: She is alert.     Comments: Sensation to light touch grossly intact in the toes and foot of the right lower extremity. Appropriate motor function also intact in the toes, foot, and ankle.  Psychiatric:        Mood and Affect: Mood and affect normal.        Speech: Speech normal.        Behavior: Behavior normal.                ED Treatments / Results  Labs (all labs ordered are listed, but only abnormal results are displayed) Labs Reviewed  CBC WITH DIFFERENTIAL/PLATELET - Abnormal; Notable for the following components:      Result Value   RDW 15.6 (*)    All other components within normal limits  BASIC METABOLIC PANEL - Abnormal; Notable for the following components:   Glucose, Bld 192 (*)    BUN 44 (*)    Creatinine, Ser 1.28 (*)    Calcium 8.8 (*)    GFR calc non Af Amer 48 (*)    GFR calc Af Amer 56 (*)    All other components within normal limits  MRSA PCR SCREENING  SARS CORONAVIRUS 2 (HOSPITAL ORDER, [facility])  HEMOGLOBIN A1C    EKG None  Radiology Dg Foot Complete Right  Result Date: 06/13/2019 CLINICAL DATA:  Diabetic foot wound. EXAM: RIGHT FOOT COMPLETE - 3+ VIEW COMPARISON:  07/17/2017 FINDINGS: There is a large skin blister noted along the medial aspect of the first MTP joint. No definite plain film findings for septic arthritis or osteomyelitis. There are moderate degenerative changes involving the midfoot with joint space narrowing and spurring, most notably  at the calcaneocuboid joint. Spurring changes from the calcaneus are noted near the Achilles and plantar fascia attachment. Small vessel calcifications are noted consistent with patient's diabetes. IMPRESSION: Large skin blister noted along the medial aspect of the first MTP joint. No definite plain film findings to suggest septic arthritis or osteomyelitis. Age advanced degenerative  changes. Electronically Signed   By: Marijo Sanes M.D.   On: 06/13/2019 12:17    Procedures Procedures (including critical care time)  Medications Ordered in ED Medications  vancomycin (VANCOCIN) IVPB 1000 mg/200 mL premix (has no administration in time range)  insulin aspart (novoLOG) injection 0-9 Units (has no administration in time range)  insulin aspart (novoLOG) injection 0-5 Units (has no administration in time range)  piperacillin-tazobactam (ZOSYN) IVPB 3.375 g (3.375 g Intravenous New Bag/Given 06/13/19 1150)     Initial Impression / Assessment and Plan / ED Course  I have reviewed the triage vital signs and the nursing notes.  Pertinent labs & imaging results that were available during my care of the patient were reviewed by me and considered in my medical decision making (see chart for details).  Clinical Course as of Jun 13 1335  Sun Jun 13, 2019  1306 Spoke with Dr. Denton Brick, hospitalist.  Agrees to admit the patient.   [SJ]    Clinical Course User Index [SJ] Delorus Langwell C, PA-C       Patient presents with wound to right foot.  She has associated spreading redness from the wound. Patient is nontoxic appearing, afebrile, not tachycardic, not tachypneic, not hypotensive, and is in no apparent distress.  No leukocytosis. Despite the above, patient does have poorly controlled diabetes and a history of wound complications requiring amputation.  For these reasons, I suspect patient may benefit from IV antibiotics initially as well as at least observation overnight to assure the start of improvement.  Findings and plan of care discussed with Fredia Sorrow, MD.   Vitals:   06/13/19 1100 06/13/19 1130 06/13/19 1200 06/13/19 1230  BP: (!) 170/69 (!) 152/60 135/61 (!) 149/64  Pulse: 72 67 65 64  Resp:      Temp:      TempSrc:      SpO2: 99% 100% 99% 99%  Weight:      Height:         Final Clinical Impressions(s) / ED Diagnoses   Final diagnoses:  Cellulitis of  right lower extremity  Open wound of right foot, initial encounter    ED Discharge Orders    None       Layla Maw 06/13/19 1336    Fredia Sorrow, MD 06/14/19 1227

## 2019-06-13 NOTE — ED Notes (Signed)
To Rad 

## 2019-06-13 NOTE — ED Notes (Signed)
Mr Natalie Contreras in to recheck

## 2019-06-13 NOTE — ED Notes (Signed)
Dr Z in to assess 

## 2019-06-13 NOTE — ED Notes (Signed)
Report to Lisa, RN

## 2019-06-13 NOTE — H&P (Signed)
Patient Demographics:    Natalie Contreras, is a 53 y.o. female  MRN: [id number]   DOB - May 12, 1966  Admit Date - 06/13/2019  Outpatient Primary MD for the patient is Celene Squibb, MD   Assessment & Plan:    Principal Problem:   Wound infection Active Problems:   Benign essential HTN   Diabetes mellitus with neuropathy ([place])   Chronic diastolic CHF (congestive heart failure) ([place])    1)Rt Foot wound infection/cellulitis----patient received Vanco and Zosyn in the ED, MRSA PCR pending, okay to treat empirically with IV Rocephin, wound cultures obtained and pending,  2)DM-no recent A1c available, continue Levemir 35 units daily along with metformin, as well as Amaryl with breakfast and sliding scale insulin hold Jardiance  3)HTN--BP is not at goal, restart Coreg 6.25 mg twice daily, along with hydralazine 50 mg 3 times daily,  4)HFpEF--appears euvolemic, last known EF 55 to 60%, -continue Coreg as above, continue torsemide 20 mg daily  5)PAD--continue simvastatin and aspirin, smoking cessation strongly advised patient not ready to quit  With History of - Reviewed by me  Past Medical History:  Diagnosis Date  . Acute diastolic CHF (congestive heart failure) ([place]) 10/28/2016  . Carotid stenosis, asymptomatic, bilateral 01/15/2017  . Diabetes mellitus without complication ([place])   . Heart murmur   . Hypertension   . PAD (peripheral artery disease) ([place]) 04/10/2017   Left ABI 0.74. Right ABI 0.96.      Past Surgical History:  Procedure Laterality Date  . AMPUTATION TOE Left 05/05/2017   Procedure: LEFT SECOND TOE AMPUTATION;  Surgeon: Aviva Signs, MD;  Location: [facility];  Service: General;  Laterality: Left;  . CATARACT EXTRACTION W/PHACO Left 02/08/2016   Procedure: CATARACT EXTRACTION PHACO AND INTRAOCULAR  LENS PLACEMENT (IOC);  Surgeon: Tonny Branch, MD;  Location: [facility];  Service: Ophthalmology;  Laterality: Left;  CDE 11.43   . CATARACT EXTRACTION W/PHACO Right 02/26/2016   Procedure: CATARACT EXTRACTION PHACO AND INTRAOCULAR LENS PLACEMENT RIGHT; CDE:  16.90;  Surgeon: Tonny Branch, MD;  Location: [facility];  Service: Ophthalmology;  Laterality: Right;  . CHOLECYSTECTOMY    . FOREIGN BODY REMOVAL Left 04/09/2017   Procedure: FOREIGN BODY REMOVAL ADULT FOOT;  Surgeon: Aviva Signs, MD;  Location: [facility];  Service: General;  Laterality: Left;  . TRANSMETATARSAL AMPUTATION Left 07/21/2017   Procedure: TRANSMETATARSAL AMPUTATION LEFT FOOT;  Surgeon: Aviva Signs, MD;  Location: [facility];  Service: General;  Laterality: Left;      Chief Complaint  Patient presents with  . Wound Infection      HPI:    Natalie Contreras  is a 53 y.o. female  With PMhx of dCHF,  DM2, HTN, PAD,presents with right foot wound since 06/10/2019  -patient has neuropathy does not recall any injury the wound on the plantar surface appears to be around a callus with resultant erythema and a huge blister over the dorsal aspect of the right big  toe   --Fevers no chills no nausea no vomiting  --Redness and warmth has progressively worsened  --Patient is concerned because she had a nonhealing wound on the left foot previously requiring amputations of fourth toes of the left foot she only has a left big toe  In ED--WBC is 9.8, creatinine is 1.28  -No chest pains, no palpitations no dizziness no shortness of breath   Review of systems:    In addition to the HPI above,   A full Review of  Systems was done, all other systems reviewed are negative except as noted above in HPI , .    Social History:  Reviewed by me    Social History   Tobacco Use  . Smoking status: Current Every Day Smoker    Packs/day: 0.75    Years: 25.00    Pack years: 18.75    Types: Cigarettes    Start date: 09/16/1984  . Smokeless tobacco: Never Used   Substance Use Topics  . Alcohol use: No     Family History :  Reviewed by me    Family History  Problem Relation Age of Onset  . Lung cancer Mother   . Lung cancer Father   . AAA (abdominal aortic aneurysm) Brother      Home Medications:   Prior to Admission medications   Medication Sig Start Date End Date Taking? Authorizing Provider  aspirin EC 81 MG tablet Take 81 mg by mouth daily.   Yes [provider]  carvedilol (COREG) 6.25 MG tablet TAKE (1) TABLET BY MOUTH TWICE DAILY. Patient taking differently: Take 6.25 mg by mouth 2 (two) times daily with a meal.  11/10/17  Yes Branch, Alphonse Guild, MD  empagliflozin (JARDIANCE) 25 MG TABS tablet Take 25 mg by mouth daily.   Yes [provider]  hydrALAZINE (APRESOLINE) 10 MG tablet Take 5 mg by mouth 2 (two) times daily.   Yes [provider]  insulin detemir (LEVEMIR) 100 UNIT/ML injection Inject 35 Units into the skin at bedtime.   Yes [provider]  metFORMIN (GLUCOPHAGE) 500 MG tablet Take 1,000 mg by mouth daily.    Yes [provider]  simvastatin (ZOCOR) 40 MG tablet Take 40 mg by mouth daily.   Yes [provider]  torsemide (DEMADEX) 20 MG tablet Take 40 mg in the AM and 20 mg in the PM Patient taking differently: Take 20 mg by mouth once. Take 40 mg in the AM and 20 mg in the PM 02/07/17  Yes Branch, Alphonse Guild, MD  gabapentin (NEURONTIN) 300 MG capsule Take 1 capsule (300 mg total) 3 (three) times daily by mouth. 07/23/17 07/23/18  Aviva Signs, MD  glimepiride (AMARYL) 2 MG tablet Take 2 mg by mouth daily with breakfast.  03/15/17   [provider]  hydrALAZINE (APRESOLINE) 50 MG tablet Take 1 tablet (50 mg total) by mouth 3 (three) times daily. Patient not taking: Reported on 06/13/2019 01/15/17 07/17/17  Imogene Burn, PA-C  sulfamethoxazole-trimethoprim (BACTRIM) 400-80 MG tablet Take 1 tablet 2 (two) times daily by mouth. Patient not taking: Reported on  06/13/2019 07/23/17   Aviva Signs, MD     Allergies:    No Known Allergies   Physical Exam:   Vitals  Blood pressure (!) 166/60, pulse 67, temperature 97.7 F (36.5 C), temperature source Oral, resp. rate 17, height 5\' 4"  (1.626 m), weight 98.4 kg, last menstrual period 12/16/2015, SpO2 98 %.  Physical Examination: General appearance - alert,  obese appearing, and in no distress Mental status - alert, oriented to person, place, and time,  Eyes - sclera anicteric Neck - supple, no JVD elevation , Chest - clear  to auscultation bilaterally, symmetrical air movement,  Heart - S1 and S2 normal, regular  Abdomen - soft, nontender, nondistended, no masses or organomegaly Neurological - screening mental status exam normal, neck supple without rigidity, cranial nerves II through XII intact, DTR's normal and symmetric Extremities - no pedal edema noted, intact peripheral pulses  Skin - warm, dry Feet---  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 patient only has a left big toe Rt Foot--- callus on plantar surfaces, blister over right big toe erythema warmth tenderness, over the dorsal aspect of the Rt foot--please see photos in epic    Data Review:    CBC Recent Labs  Lab 06/13/19 1138  WBC 9.8  HGB 13.5  HCT 43.5  PLT 339  MCV 89.9  MCH 27.9  MCHC 31.0  RDW 15.6*  LYMPHSABS 2.0  MONOABS 0.5  EOSABS 0.2  BASOSABS 0.1   ------------------------------------------------------------------------------------------------------------------  Chemistries  Recent Labs  Lab 06/13/19 1138  NA 137  K 3.9  CL 102  CO2 24  GLUCOSE 192*  BUN 44*  CREATININE 1.28*  CALCIUM 8.8*   ------------------------------------------------------------------------------------------------------------------ estimated creatinine clearance is 59.3 mL/min (A) (by C-G formula based on SCr of 1.28 mg/dL (H)).  ------------------------------------------------------------------------------------------------------------------ No results for input(s): TSH, T4TOTAL, T3FREE, THYROIDAB in the last 72 hours.  Invalid input(s): FREET3   Coagulation profile No results for input(s): INR, PROTIME in the last 168 hours. ------------------------------------------------------------------------------------------------------------------- No results for input(s): DDIMER in the last 72 hours. -------------------------------------------------------------------------------------------------------------------  Cardiac Enzymes No results for input(s): CKMB, TROPONINI, MYOGLOBIN in the last 168 hours.  Invalid input(s): CK ------------------------------------------------------------------------------------------------------------------    Component Value Date/Time   BNP 220.0 (H) 10/28/2016 0739     ---------------------------------------------------------------------------------------------------------------  Urinalysis    Component Value Date/Time   COLORURINE STRAW (A) 04/07/2017 1027   APPEARANCEUR CLEAR 04/07/2017 1027   LABSPEC 1.006 04/07/2017 1027   PHURINE 5.0 04/07/2017 1027   GLUCOSEU NEGATIVE 04/07/2017 1027   HGBUR NEGATIVE 04/07/2017 1027   BILIRUBINUR NEGATIVE 04/07/2017 1027   KETONESUR NEGATIVE 04/07/2017 1027   PROTEINUR NEGATIVE 04/07/2017 1027   NITRITE NEGATIVE 04/07/2017 1027   LEUKOCYTESUR NEGATIVE 04/07/2017 1027    ----------------------------------------------------------------------------------------------------------------   Imaging Results:    Dg Foot Complete Right  Result Date: 06/13/2019 CLINICAL DATA:  Diabetic foot wound. EXAM: RIGHT FOOT COMPLETE - 3+ VIEW COMPARISON:  07/17/2017 FINDINGS: There is a large skin blister noted along the medial aspect of the first MTP joint. No definite plain film findings for septic arthritis or osteomyelitis. There are moderate  degenerative changes involving the midfoot with joint space narrowing and spurring, most notably at the calcaneocuboid joint. Spurring changes from the calcaneus are noted near the Achilles and plantar fascia attachment. Small vessel calcifications are noted consistent with patient's diabetes. IMPRESSION: Large skin blister noted along the medial aspect of the first MTP joint. No definite plain film findings to suggest septic arthritis or osteomyelitis. Age advanced degenerative changes. Electronically Signed   By: Marijo Sanes M.D.   On: 06/13/2019 12:17    Radiological Exams on Admission: Dg Foot Complete Right  Result Date: 06/13/2019 CLINICAL DATA:  Diabetic foot wound. EXAM: RIGHT FOOT COMPLETE - 3+ VIEW COMPARISON:  07/17/2017 FINDINGS: There is a large skin blister noted along the medial aspect of the first MTP joint. No definite plain  film findings for septic arthritis or osteomyelitis. There are moderate degenerative changes involving the midfoot with joint space narrowing and spurring, most notably at the calcaneocuboid joint. Spurring changes from the calcaneus are noted near the Achilles and plantar fascia attachment. Small vessel calcifications are noted consistent with patient's diabetes. IMPRESSION: Large skin blister noted along the medial aspect of the first MTP joint. No definite plain film findings to suggest septic arthritis or osteomyelitis. Age advanced degenerative changes. Electronically Signed   By: Marijo Sanes M.D.   On: 06/13/2019 12:17    DVT Prophylaxis -SCD/Heparin AM Labs Ordered, also please review Full Orders  Family Communication: Admission, patients condition and plan of care including tests being ordered have been discussed with the patient who indicate understanding and agree with the plan   Code Status - Full Code  Likely DC to  Home   Condition   stable  Roxan Hockey M.D on 06/13/2019 at 6:13 PM Go to [email or web address] -  for contact info  [facility] - Office  [phone]

## 2019-06-13 NOTE — ED Triage Notes (Signed)
Pt reports is diabetic and noticed a large blister bottom and side of r foot last Wednesday.  Denies injury.

## 2019-06-14 DIAGNOSIS — Z7984 Long term (current) use of oral hypoglycemic drugs: Secondary | ICD-10-CM | POA: Diagnosis not present

## 2019-06-14 DIAGNOSIS — L03115 Cellulitis of right lower limb: Secondary | ICD-10-CM | POA: Diagnosis not present

## 2019-06-14 DIAGNOSIS — F17210 Nicotine dependence, cigarettes, uncomplicated: Secondary | ICD-10-CM | POA: Diagnosis present

## 2019-06-14 DIAGNOSIS — I110 Hypertensive heart disease with heart failure: Secondary | ICD-10-CM | POA: Diagnosis present

## 2019-06-14 DIAGNOSIS — I5032 Chronic diastolic (congestive) heart failure: Secondary | ICD-10-CM | POA: Diagnosis present

## 2019-06-14 DIAGNOSIS — N179 Acute kidney failure, unspecified: Secondary | ICD-10-CM | POA: Diagnosis present

## 2019-06-14 DIAGNOSIS — L089 Local infection of the skin and subcutaneous tissue, unspecified: Secondary | ICD-10-CM | POA: Diagnosis not present

## 2019-06-14 DIAGNOSIS — Z7982 Long term (current) use of aspirin: Secondary | ICD-10-CM | POA: Diagnosis not present

## 2019-06-14 DIAGNOSIS — E1151 Type 2 diabetes mellitus with diabetic peripheral angiopathy without gangrene: Secondary | ICD-10-CM | POA: Diagnosis present

## 2019-06-14 DIAGNOSIS — Z20828 Contact with and (suspected) exposure to other viral communicable diseases: Secondary | ICD-10-CM | POA: Diagnosis present

## 2019-06-14 DIAGNOSIS — T148XXA Other injury of unspecified body region, initial encounter: Secondary | ICD-10-CM | POA: Diagnosis not present

## 2019-06-14 DIAGNOSIS — Z801 Family history of malignant neoplasm of trachea, bronchus and lung: Secondary | ICD-10-CM | POA: Diagnosis not present

## 2019-06-14 LAB — CBC
HCT: 38.9 % (ref 36.0–46.0)
Hemoglobin: 12.2 g/dL (ref 12.0–15.0)
MCH: 28.4 pg (ref 26.0–34.0)
MCHC: 31.4 g/dL (ref 30.0–36.0)
MCV: 90.5 fL (ref 80.0–100.0)
Platelets: 320 10*3/uL (ref 150–400)
RBC: 4.3 MIL/uL (ref 3.87–5.11)
RDW: 15.9 % — ABNORMAL HIGH (ref 11.5–15.5)
WBC: 7.7 10*3/uL (ref 4.0–10.5)
nRBC: 0 % (ref 0.0–0.2)

## 2019-06-14 LAB — BASIC METABOLIC PANEL
Anion gap: 11 (ref 5–15)
BUN: 48 mg/dL — ABNORMAL HIGH (ref 6–20)
CO2: 26 mmol/L (ref 22–32)
Calcium: 8.8 mg/dL — ABNORMAL LOW (ref 8.9–10.3)
Chloride: 101 mmol/L (ref 98–111)
Creatinine, Ser: 1.52 mg/dL — ABNORMAL HIGH (ref 0.44–1.00)
GFR calc Af Amer: 46 mL/min — ABNORMAL LOW (ref >60)
GFR calc non Af Amer: 39 mL/min — ABNORMAL LOW (ref >60)
Glucose, Bld: 248 mg/dL — ABNORMAL HIGH (ref 70–99)
Potassium: 3.7 mmol/L (ref 3.5–5.1)
Sodium: 138 mmol/L (ref 135–145)

## 2019-06-14 LAB — GLUCOSE, CAPILLARY
Glucose-Capillary: 218 mg/dL — ABNORMAL HIGH (ref 70–99)
Glucose-Capillary: 173 mg/dL — ABNORMAL HIGH (ref 70–99)
Glucose-Capillary: 176 mg/dL — ABNORMAL HIGH (ref 70–99)
Glucose-Capillary: 201 mg/dL — ABNORMAL HIGH (ref 70–99)

## 2019-06-14 LAB — HEMOGLOBIN A1C
Hgb A1c MFr Bld: 10.7 % — ABNORMAL HIGH (ref 4.8–5.6)
Mean Plasma Glucose: 260.39 mg/dL

## 2019-06-14 MED ORDER — SODIUM CHLORIDE 0.9% FLUSH
3.0000 mL | Freq: Two times a day (BID) | INTRAVENOUS | Status: DC
  Administered 2019-06-14 – 2019-06-15 (×3): 3 mL via INTRAVENOUS

## 2019-06-14 MED ORDER — SODIUM CHLORIDE 0.9 % IV SOLN
INTRAVENOUS | Status: DC
  Administered 2019-06-14 – 2019-06-15 (×3): via INTRAVENOUS

## 2019-06-14 NOTE — Progress Notes (Signed)
Patient Demographics:    Natalie Contreras, is a 53 y.o. female, DOB - 02/25/1966, [id number]  Admit date - 06/13/2019   Admitting Physician Sophronia Varney Denton Brick, MD  Outpatient Primary MD for the patient is Celene Squibb, MD  LOS - 0   Chief Complaint  Patient presents with   Wound Infection        Subjective:    Natalie Contreras today has no fevers,  ,  No chest pain, no fever no chills no vomiting no diarrhea  Assessment  & Plan :    Principal Problem:   Wound infection Active Problems:   Benign essential HTN   Diabetes mellitus with neuropathy ([place])   Chronic diastolic CHF (congestive heart failure) [facility]  Brief Summary 53 y.o. female  With PMhx of dCHF,  DM2, HTN, PAD-admitted with infected right foot wound on 06/13/2019   A/P 1)Rt Foot wound infection/cellulitis----patient received Vanco and Zosyn in the ED, MRSA PCR pending,  -Prelim Gram stain with gram-positive cocci,  -continue IV Rocephin pending wound cultures   2)DM-no recent A1c available,  -continue Levemir 35 units daily along with metformin, as well as Amaryl with breakfast and sliding scale insulin hold Jardiance  3)HTN--continue oreg 6.25 mg twice daily, along with hydralazine 50 mg 3 times daily,  4)HFpEF--appears euvolemic, last known EF 55 to 60%, -continue Coreg as above, continue torsemide 20 mg daily  5)PAD--continue simvastatin and aspirin, smoking cessation strongly advised patient not ready to quit  Disposition/Need for in-Hospital Stay- patient unable to be discharged at this time due to --continue IV Rocephin for infected right foot pending culture results*  Code Status : full code  Family Communication:   NA (patient is alert, awake and coherent)   Disposition Plan  : home   Consults  :  na  DVT Prophylaxis  :    - Heparin - SCDs  Lab Results  Component Value Date   PLT 320 06/14/2019     Inpatient Medications  Scheduled Meds:  aspirin EC  81 mg Oral Daily   carvedilol  6.25 mg Oral BID WC   gabapentin  300 mg Oral TID   glimepiride  2 mg Oral Q breakfast   heparin  5,000 Units Subcutaneous Q8H   hydrALAZINE  50 mg Oral TID   insulin aspart  0-5 Units Subcutaneous QHS   insulin aspart  0-9 Units Subcutaneous TID WC   insulin detemir  35 Units Subcutaneous QHS   metFORMIN  1,000 mg Oral Q breakfast   simvastatin  40 mg Oral q1800   sodium chloride flush  3 mL Intravenous Q12H   Continuous Infusions:  sodium chloride 250 mL (06/13/19 1903)   sodium chloride 75 mL/hr at 06/14/19 1614   cefTRIAXone (ROCEPHIN)  IV 1 g (06/14/19 1728)   PRN Meds:.sodium chloride, acetaminophen **OR** acetaminophen, albuterol, labetalol, ondansetron **OR** ondansetron (ZOFRAN) IV, polyethylene glycol, sodium chloride flush, traZODone    Anti-infectives (From admission, onward)   Start     Dose/Rate Route Frequency Ordered Stop   06/13/19 1800  cefTRIAXone (ROCEPHIN) 1 g in sodium chloride 0.9 % 100 mL IVPB     1 g 200 mL/hr over 30 Minutes Intravenous Every 24 hours 06/13/19 1755     06/13/19 1400  vancomycin (VANCOCIN) IVPB 1000 mg/200 mL premix     1,000 mg 200 mL/hr over 60 Minutes Intravenous  Once 06/13/19 1308 06/13/19 1501   06/13/19 1130  piperacillin-tazobactam (ZOSYN) IVPB 3.375 g     3.375 g 100 mL/hr over 30 Minutes Intravenous  Once 06/13/19 1118 06/13/19 1200        Objective:   Vitals:   06/13/19 1518 06/13/19 2159 06/14/19 0638 06/14/19 1600  BP: (!) 166/60 (!) 131/55 (!) 159/62 (!) 137/54  Pulse: 67 66 69 72  Resp: 17  18 14   Temp: 97.7 F (36.5 C) 97.9 F (36.6 C) 98 F (36.7 C) 98.2 F (36.8 C)  TempSrc: Oral Oral Oral Oral  SpO2: 98% 96% 100% 99%  Weight:      Height:        Wt Readings from Last 3 Encounters:  06/13/19 98.4 kg  11/27/17 87.1 kg  11/13/17 82.1 kg     Intake/Output Summary (Last 24 hours) at 06/14/2019  1855 Last data filed at 06/14/2019 1537 Gross per 24 hour  Intake 1030.14 ml  Output --  Net 1030.14 ml     Physical Exam  Gen:- Awake Alert,   HEENT:- [place].AT, No sclera icterus Neck-Supple Neck,No JVD,.  Lungs-  CTAB , fair symmetrical air movement CV- S1, S2 normal, regular  Abd-  +ve B.Sounds, Abd Soft, No tenderness,    Extremity/Skin:- No  edema, pedal pulses present  Psych-affect is appropriate, oriented x3 Neuro-no new focal deficits, no tremors MSK--right foot blister significantly reduced in size, patient with significant amount of seropurulent drainage, erythema warmth and swelling appears to be improving on the right foot   Data Review:   Micro Results Recent Results (from the past 240 hour(s))  SARS Coronavirus 2 [facility] order, Performed in [place] hospital lab)     Status: None   Collection Time: 06/13/19 11:50 AM  Result Value Ref Range Status   SARS Coronavirus 2 NEGATIVE NEGATIVE Final    Comment: (NOTE) If result is NEGATIVE SARS-CoV-2 target nucleic acids are NOT DETECTED. The SARS-CoV-2 RNA is generally detectable in upper and lower  respiratory specimens during the acute phase of infection. The lowest  concentration of SARS-CoV-2 viral copies this assay can detect is 250  copies / mL. A negative result does not preclude SARS-CoV-2 infection  and should not be used as the sole basis for treatment or other  patient management decisions.  A negative result may occur with  improper specimen collection / handling, submission of specimen other  than nasopharyngeal swab, presence of viral mutation(s) within the  areas targeted by this assay, and inadequate number of viral copies  (<250 copies / mL). A negative result must be combined with clinical  observations, patient history, and epidemiological information. If result is POSITIVE SARS-CoV-2 target nucleic acids are DETECTED. The SARS-CoV-2 RNA is generally detectable in upper and lower  respiratory  specimens dur ing the acute phase of infection.  Positive  results are indicative of active infection with SARS-CoV-2.  Clinical  correlation with patient history and other diagnostic information is  necessary to determine patient infection status.  Positive results do  not rule out bacterial infection or co-infection with other viruses. If result is PRESUMPTIVE POSTIVE SARS-CoV-2 nucleic acids MAY BE PRESENT.   A presumptive positive result was obtained on the submitted specimen  and confirmed on repeat testing.  While 2019 novel coronavirus  (SARS-CoV-2) nucleic acids may be present in the submitted sample  additional confirmatory  testing may be necessary for epidemiological  and / or clinical management purposes  to differentiate between  SARS-CoV-2 and other Sarbecovirus currently known to infect humans.  If clinically indicated additional testing with an alternate test  methodology [phone]) is advised. The SARS-CoV-2 RNA is generally  detectable in upper and lower respiratory sp ecimens during the acute  phase of infection. The expected result is Negative. Fact Sheet for Patients:  StrictlyIdeas.no Fact Sheet for Healthcare Providers: BankingDealers.co.za This test is not yet approved or cleared by the [place] FDA and has been authorized for detection and/or diagnosis of SARS-CoV-2 by FDA under an Emergency Use Authorization (EUA).  This EUA will remain in effect (meaning this test can be used) for the duration of the COVID-19 declaration under Section 564(b)(1) of the Act, 21 U.S.C. section 360bbb-3(b)(1), unless the authorization is terminated or revoked sooner. Performed at [facility], [address] [place], [place] [address]   MRSA PCR Screening     Status: None   Collection Time: 06/13/19  5:08 PM   Specimen: Nasal Mucosa; Nasopharyngeal  Result Value Ref Range Status   MRSA by PCR NEGATIVE NEGATIVE Final     Comment:        The GeneXpert MRSA Assay (FDA approved for NASAL specimens only), is one component of a comprehensive MRSA colonization surveillance program. It is not intended to diagnose MRSA infection nor to guide or monitor treatment for MRSA infections. Performed at [facility], [address], [place], [place] [address]   Aerobic/Anaerobic Culture (surgical/deep wound)     Status: None (Preliminary result)   Collection Time: 06/13/19  6:14 PM   Specimen: Wound  Result Value Ref Range Status   Specimen Description   Final    WOUND Performed at [facility], [address], [place], [place] [address]    Special Requests   Final    Normal Performed at [facility], [address], [place], [place] [address]    Gram Stain   Final    CORRECTED RESULTS ABUNDANT WBC PRESENT, PREDOMINANTLY PMN ABUNDANT GRAM POSITIVE COCCI PREVIOUSLY REPORTED AS: NO ORGANISMS SEEN MODERATE WBC PRESENT, PREDOMINANTLY PMN Performed at [place]: RN Nash Mantis [phone] [id number] FCP    Culture   Final    TOO YOUNG TO READ Performed at [facility], [place] [address], [place], [place] [address]    Report Status PENDING  Incomplete    Radiology Reports Dg Foot Complete Right  Result Date: 06/13/2019 CLINICAL DATA:  Diabetic foot wound. EXAM: RIGHT FOOT COMPLETE - 3+ VIEW COMPARISON:  07/17/2017 FINDINGS: There is a large skin blister noted along the medial aspect of the first MTP joint. No definite plain film findings for septic arthritis or osteomyelitis. There are moderate degenerative changes involving the midfoot with joint space narrowing and spurring, most notably at the calcaneocuboid joint. Spurring changes from the calcaneus are noted near the Achilles and plantar fascia attachment. Small vessel calcifications are noted consistent with patient's diabetes. IMPRESSION: Large skin blister noted along the medial aspect of the first MTP joint. No definite plain  film findings to suggest septic arthritis or osteomyelitis. Age advanced degenerative changes. Electronically Signed   By: Marijo Sanes M.D.   On: 06/13/2019 12:17     CBC Recent Labs  Lab 06/13/19 1138 06/14/19 0504  WBC 9.8 7.7  HGB 13.5 12.2  HCT 43.5 38.9  PLT 339 320  MCV 89.9 90.5  MCH 27.9 28.4  MCHC 31.0 31.4  RDW 15.6* 15.9*  LYMPHSABS 2.0  --   MONOABS 0.5  --   EOSABS 0.2  --   BASOSABS 0.1  --     Chemistries  Recent Labs  Lab 06/13/19 1138 06/14/19 0504  NA 137 138  K 3.9 3.7  CL 102 101  CO2 24 26  GLUCOSE 192* 248*  BUN 44* 48*  CREATININE 1.28* 1.52*  CALCIUM 8.8* 8.8*   ------------------------------------------------------------------------------------------------------------------ No results for input(s): CHOL, HDL, LDLCALC, TRIG, CHOLHDL, LDLDIRECT in the last 72 hours.  Lab Results  Component Value Date   HGBA1C 10.7 (H) 06/13/2019   ------------------------------------------------------------------------------------------------------------------ No results for input(s): TSH, T4TOTAL, T3FREE, THYROIDAB in the last 72 hours.  Invalid input(s): FREET3 ------------------------------------------------------------------------------------------------------------------ No results for input(s): VITAMINB12, FOLATE, FERRITIN, TIBC, IRON, RETICCTPCT in the last 72 hours.  Coagulation profile No results for input(s): INR, PROTIME in the last 168 hours.  No results for input(s): DDIMER in the last 72 hours.  Cardiac Enzymes No results for input(s): CKMB, TROPONINI, MYOGLOBIN in the last 168 hours.  Invalid input(s): CK ------------------------------------------------------------------------------------------------------------------    Component Value Date/Time   BNP 220.0 (H) 10/28/2016 [id number]   Roxan Hockey M.D on 06/14/2019 at 6:55 PM  Go to [email or web address] - for contact info  [facility] - Office  [phone]

## 2019-06-15 LAB — BASIC METABOLIC PANEL
Anion gap: 13 (ref 5–15)
BUN: 46 mg/dL — ABNORMAL HIGH (ref 6–20)
CO2: 21 mmol/L — ABNORMAL LOW (ref 22–32)
Calcium: 8.7 mg/dL — ABNORMAL LOW (ref 8.9–10.3)
Chloride: 102 mmol/L (ref 98–111)
Creatinine, Ser: 1.24 mg/dL — ABNORMAL HIGH (ref 0.44–1.00)
GFR calc Af Amer: 58 mL/min — ABNORMAL LOW (ref >60)
GFR calc non Af Amer: 50 mL/min — ABNORMAL LOW (ref >60)
Glucose, Bld: 186 mg/dL — ABNORMAL HIGH (ref 70–99)
Potassium: 3.9 mmol/L (ref 3.5–5.1)
Sodium: 136 mmol/L (ref 135–145)

## 2019-06-15 LAB — HIV ANTIBODY (ROUTINE TESTING W REFLEX): HIV Screen 4th Generation wRfx: NONREACTIVE

## 2019-06-15 LAB — GLUCOSE, CAPILLARY
Glucose-Capillary: 164 mg/dL — ABNORMAL HIGH (ref 70–99)
Glucose-Capillary: 184 mg/dL — ABNORMAL HIGH (ref 70–99)

## 2019-06-15 MED ORDER — ACETAMINOPHEN 325 MG PO TABS: 650.0000 mg | ORAL_TABLET | Freq: Four times a day (QID) | ORAL | 0 refills | Status: DC | PRN

## 2019-06-15 MED ORDER — DOXYCYCLINE HYCLATE 100 MG PO TABS
100.0000 mg | ORAL_TABLET | Freq: Once | ORAL | Status: AC
  Administered 2019-06-15: 100 mg via ORAL
  Filled 2019-06-15: qty 1

## 2019-06-15 MED ORDER — CEPHALEXIN 500 MG PO CAPS: 500.0000 mg | ORAL_CAPSULE | Freq: Three times a day (TID) | ORAL | 0 refills | Status: AC

## 2019-06-15 MED ORDER — ASPIRIN EC 81 MG PO TBEC: 81.0000 mg | DELAYED_RELEASE_TABLET | Freq: Every day | ORAL | 2 refills | Status: DC

## 2019-06-15 MED ORDER — CEPHALEXIN 500 MG PO CAPS
500.0000 mg | ORAL_CAPSULE | Freq: Once | ORAL | Status: AC
  Administered 2019-06-15: 500 mg via ORAL
  Filled 2019-06-15: qty 1

## 2019-06-15 MED ORDER — TORSEMIDE 20 MG PO TABS: 20.0000 mg | ORAL_TABLET | Freq: Every day | ORAL | 2 refills | Status: DC

## 2019-06-15 MED ORDER — HYDRALAZINE HCL 50 MG PO TABS: 50.0000 mg | ORAL_TABLET | Freq: Three times a day (TID) | ORAL | 2 refills | Status: DC

## 2019-06-15 MED ORDER — CARVEDILOL 3.125 MG PO TABS: 3.1250 mg | ORAL_TABLET | Freq: Two times a day (BID) | ORAL | 3 refills | Status: DC

## 2019-06-15 MED ORDER — CARVEDILOL 6.25 MG PO TABS: 6.2500 mg | ORAL_TABLET | Freq: Two times a day (BID) | ORAL | 4 refills | Status: DC

## 2019-06-15 MED ORDER — DOXYCYCLINE HYCLATE 100 MG PO TABS: 100.0000 mg | ORAL_TABLET | Freq: Two times a day (BID) | ORAL | 0 refills | Status: AC

## 2019-06-15 NOTE — Progress Notes (Signed)
Inpatient Diabetes Program Recommendations  AACE/ADA: New Consensus Statement on Inpatient Glycemic Control (2015)  Target Ranges:  Prepandial:   less than 140 mg/dL      Peak postprandial:   less than 180 mg/dL (1-2 hours)      Critically ill patients:  140 - 180 mg/dL   Lab Results  Component Value Date   GLUCAP 164 (H) 06/15/2019   HGBA1C 10.7 (H) 06/13/2019    Spoke with patient over the phone regarding diabetes and home regimen for diabetes control. Patient reports she sees her PCP for DM management and last saw him about 1 month ago. No changes were made to her medication.   Discussed what an A1c is and told pt her A1c level of 10.7% this admission. Discussed glucose and A1c goals. Pt reports recently her glucose trends increasing. Discussed how infection stress and lifestyle and fluctuate glucose levels. Discussed importance of glucose control and when to call her PCP for adjustments. Pt has all the DM supplies that she needs. She has an appointment this Friday with her PCP and also an appointment in November for blood work.   Pt does not have any questions or needs at this time   Thanks,  Tama Headings RN, MSN, [facility] Inpatient Diabetes Coordinator Team Pager [phone] (8a-5p)

## 2019-06-15 NOTE — Discharge Instructions (Signed)
1)Please take doxycycline and cephalexin antibiotics as prescribed for the next week 2)Please call me back on Wednesday 06/16/2019 after 2 PM at [phone] to discuss the results of your final wound culture and to see if the antibiotic needs to be changed 3)Your Coreg/carvedilol has been reduced to 3.125 mg tablets 1 twice a day due to   blood pressure readings  4) applied dressing changes as discussed with dry gauze and Kerlix wrap to your right foot draining wound at least once a day 5)STOP hydralazine due to blood pressure concerns 6) Your diabetes is out of control--- your hemoglobin A1c is high at 10.7, it  should be less than 7 please follow-up with the primary care physician for adjustment of her diabetic medications

## 2019-06-15 NOTE — Discharge Summary (Signed)
Natalie Contreras, is a 53 y.o. female  DOB 1965-09-28  MRN [id number].  Admission date:  06/13/2019  Admitting Physician  Roxan Hockey, MD  Discharge Date:  06/15/2019   Primary MD  Celene Squibb, MD  Recommendations for primary care physician for things to follow:   - 1)Please take doxycycline and cephalexin antibiotics as prescribed for the next week 2)Please call me back on Wednesday 06/16/2019 after 2 PM at [phone] to discuss the results of your final wound culture and to see if the antibiotic needs to be changed 3)Your Coreg/carvedilol has been reduced to 3.125 mg tablets 1 twice a day due to   blood pressure readings  4) applied dressing changes as discussed with dry gauze and Kerlix wrap to your right foot draining wound at least once a day 5)STOP hydralazine due to blood pressure concerns 6) Your diabetes is out of control--- your hemoglobin A1c is high at 10.7, it  should be less than 7 please follow-up with the primary care physician for adjustment of her diabetic medications  Admission Diagnosis  Cellulitis of right lower extremity [id number] Open wound of right foot, initial encounter [S91.301A]   Discharge Diagnosis  Cellulitis of right lower extremity [L03.115] Open wound of right foot, initial encounter [S91.301A]    Principal Problem:   Wound infection Active Problems:   Benign essential HTN   Diabetes mellitus with neuropathy ([place])   Chronic diastolic CHF (congestive heart failure) ([place])      Past Medical History:  Diagnosis Date  . Acute diastolic CHF (congestive heart failure) ([place]) 10/28/2016  . Carotid stenosis, asymptomatic, bilateral 01/15/2017  . Diabetes mellitus without complication ([place])   . Heart murmur   . Hypertension   . PAD (peripheral artery disease) ([place]) 04/10/2017   Left ABI 0.74. Right ABI 0.96.    Past Surgical History:  Procedure Laterality Date  .  AMPUTATION TOE Left 05/05/2017   Procedure: LEFT SECOND TOE AMPUTATION;  Surgeon: Aviva Signs, MD;  Location: [facility];  Service: General;  Laterality: Left;  . CATARACT EXTRACTION W/PHACO Left 02/08/2016   Procedure: CATARACT EXTRACTION PHACO AND INTRAOCULAR LENS PLACEMENT (IOC);  Surgeon: Tonny Branch, MD;  Location: [facility];  Service: Ophthalmology;  Laterality: Left;  CDE 11.43   . CATARACT EXTRACTION W/PHACO Right 02/26/2016   Procedure: CATARACT EXTRACTION PHACO AND INTRAOCULAR LENS PLACEMENT RIGHT; CDE:  16.90;  Surgeon: Tonny Branch, MD;  Location: [facility];  Service: Ophthalmology;  Laterality: Right;  . CHOLECYSTECTOMY    . FOREIGN BODY REMOVAL Left 04/09/2017   Procedure: FOREIGN BODY REMOVAL ADULT FOOT;  Surgeon: Aviva Signs, MD;  Location: [facility];  Service: General;  Laterality: Left;  . TRANSMETATARSAL AMPUTATION Left 07/21/2017   Procedure: TRANSMETATARSAL AMPUTATION LEFT FOOT;  Surgeon: Aviva Signs, MD;  Location: [facility];  Service: General;  Laterality: Left;       HPI  from the history and physical done on the day of admission:   - Natalie Contreras  is a 53 y.o. female  With PMhx of dCHF,  DM2, HTN, PAD,presents with right foot wound since 06/10/2019 -patient has neuropathy does not recall any injury the wound on the plantar surface appears to be around a callus with resultant erythema and a huge blister over the dorsal aspect of the right big toe   --Fevers no chills no nausea no vomiting  --Redness and warmth has progressively worsened  --Patient is concerned because she had a nonhealing wound on the left foot previously requiring amputations of fourth toes of the left foot she only has a left big toe  In ED--WBC is 9.8, creatinine is 1.28  -No chest pains, no palpitations no dizziness no shortness of breath     Hospital Course:     Brief Summary 53 y.o.femaleWith PMhx of dCHF, DM2, HTN, PAD-admitted with infected right foot wound on 06/13/2019  A/P 1)Rt  Footwound MSSA infection/cellulitis----patient received Vanco and Zosyn in the ED, MRSA PCR Neg  -Treated with IV Rocephin -Final wound cultures with pansensitive MSSA -Discharge home on doxycycline and Keflex  2)AKI----acute kidney injury-due to dehydration,    creatinine on admission= 1.28  ,   baseline creatinine =1.0    , creatinine is now=  1.24 (peak was 1.52)    , renally adjust medications, avoid nephrotoxic agents / dehydration /hypotension -Acute kidney injury resolved with hydration and discontinuation of torsemide temporarily  3)DM-A1c 10.7 reflecting uncontrolled DM --Resume home regimen, follow-up with PCP for adjustments  4)HTN--continue coreg 6.25 mg twice daily, along with hydralazine 50 mg 3 times daily,  4)HFpEF--appears euvolemic, last known EF 55 to 60%,-continue Coreg as above, continue torsemide 20 mg daily  5)PAD--continue simvastatin and aspirin, smoking cessation strongly advised patient not ready to quit  Code Status : full code  Disposition Plan  : home   Discharge Condition: stable  Follow UP--- PCP for management of her uncontrolled diabetes Diet and Activity recommendation:  As advised  Discharge Instructions    Discharge Instructions    Call MD for:  difficulty breathing, headache or visual disturbances   Complete by: As directed    Call MD for:  persistant dizziness or light-headedness   Complete by: As directed    Call MD for:  persistant nausea and vomiting   Complete by: As directed    Call MD for:  redness, tenderness, or signs of infection (pain, swelling, redness, odor or green/yellow discharge around incision site)   Complete by: As directed    Call MD for:  severe uncontrolled pain   Complete by: As directed    Call MD for:  temperature >100.4   Complete by: As directed    Diet - low sodium heart healthy   Complete by: As directed    Low carb/consistent carb diabetic diet advised   Discharge instructions   Complete by: As  directed    1)Please take doxycycline and cephalexin antibiotics as prescribed for the next week 2)Please call me back on Wednesday 06/16/2019 after 2 PM at [phone] to discuss the results of your final wound culture and to see if the antibiotic needs to be changed 3)Your Coreg/carvedilol has been reduced to 3.125 mg tablets 1 twice a day due to   blood pressure readings  4) applied dressing changes as discussed with dry gauze and Kerlix wrap to your right foot draining wound at least once a day 5)STOP hydralazine due to blood pressure concerns 6) Your diabetes is out of control--- your hemoglobin A1c is high at 10.7, it  should be  less than 7 please follow-up with the primary care physician for adjustment of her diabetic medications   Increase activity slowly   Complete by: As directed         Discharge Medications     Allergies as of 06/15/2019   No Known Allergies     Medication List    STOP taking these medications   hydrALAZINE 10 MG tablet Commonly known as: APRESOLINE   hydrALAZINE 50 MG tablet Commonly known as: APRESOLINE   sulfamethoxazole-trimethoprim 400-80 MG tablet Commonly known as: Bactrim     TAKE these medications   acetaminophen 325 MG tablet Commonly known as: TYLENOL Take 2 tablets (650 mg total) by mouth every 6 (six) hours as needed for mild pain, fever or headache (or Fever >/= 101).   aspirin EC 81 MG tablet Take 1 tablet (81 mg total) by mouth daily with breakfast. What changed: when to take this   carvedilol 3.125 MG tablet Commonly known as: COREG Take 1 tablet (3.125 mg total) by mouth 2 (two) times daily with a meal. What changed:   medication strength  See the new instructions.   cephALEXin 500 MG capsule Commonly known as: Keflex Take 1 capsule (500 mg total) by mouth 3 (three) times daily for 7 days.   doxycycline 100 MG tablet Commonly known as: VIBRA-TABS Take 1 tablet (100 mg total) by mouth 2 (two) times daily for 7  days.   gabapentin 300 MG capsule Commonly known as: Neurontin Take 1 capsule (300 mg total) 3 (three) times daily by mouth.   glimepiride 2 MG tablet Commonly known as: AMARYL Take 2 mg by mouth daily with breakfast.   insulin detemir 100 UNIT/ML injection Commonly known as: LEVEMIR Inject 35 Units into the skin at bedtime.   Jardiance 25 MG Tabs tablet Generic drug: empagliflozin Take 25 mg by mouth daily.   metFORMIN 500 MG tablet Commonly known as: GLUCOPHAGE Take 1,000 mg by mouth daily.   simvastatin 40 MG tablet Commonly known as: ZOCOR Take 40 mg by mouth daily.   torsemide 20 MG tablet Commonly known as: DEMADEX Take 1 tablet (20 mg total) by mouth daily. What changed:   how much to take  how to take this  when to take this  additional instructions      Major procedures and Radiology Reports - PLEASE review detailed and final reports for all details, in brief -   Dg Foot Complete Right  Result Date: 06/13/2019 CLINICAL DATA:  Diabetic foot wound. EXAM: RIGHT FOOT COMPLETE - 3+ VIEW COMPARISON:  07/17/2017 FINDINGS: There is a large skin blister noted along the medial aspect of the first MTP joint. No definite plain film findings for septic arthritis or osteomyelitis. There are moderate degenerative changes involving the midfoot with joint space narrowing and spurring, most notably at the calcaneocuboid joint. Spurring changes from the calcaneus are noted near the Achilles and plantar fascia attachment. Small vessel calcifications are noted consistent with patient's diabetes. IMPRESSION: Large skin blister noted along the medial aspect of the first MTP joint. No definite plain film findings to suggest septic arthritis or osteomyelitis. Age advanced degenerative changes. Electronically Signed   By: Marijo Sanes M.D.   On: 06/13/2019 12:17   Micro Results   Recent Results (from the past 240 hour(s))  SARS Coronavirus 2 [facility] order, Performed in [facility]  hospital lab)     Status: None   Collection Time: 06/13/19 11:50 AM  Result Value Ref Range Status  SARS Coronavirus 2 NEGATIVE NEGATIVE Final    Comment: (NOTE) If result is NEGATIVE SARS-CoV-2 target nucleic acids are NOT DETECTED. The SARS-CoV-2 RNA is generally detectable in upper and lower  respiratory specimens during the acute phase of infection. The lowest  concentration of SARS-CoV-2 viral copies this assay can detect is 250  copies / mL. A negative result does not preclude SARS-CoV-2 infection  and should not be used as the sole basis for treatment or other  patient management decisions.  A negative result may occur with  improper specimen collection / handling, submission of specimen other  than nasopharyngeal swab, presence of viral mutation(s) within the  areas targeted by this assay, and inadequate number of viral copies  (<250 copies / mL). A negative result must be combined with clinical  observations, patient history, and epidemiological information. If result is POSITIVE SARS-CoV-2 target nucleic acids are DETECTED. The SARS-CoV-2 RNA is generally detectable in upper and lower  respiratory specimens dur ing the acute phase of infection.  Positive  results are indicative of active infection with SARS-CoV-2.  Clinical  correlation with patient history and other diagnostic information is  necessary to determine patient infection status.  Positive results do  not rule out bacterial infection or co-infection with other viruses. If result is PRESUMPTIVE POSTIVE SARS-CoV-2 nucleic acids MAY BE PRESENT.   A presumptive positive result was obtained on the submitted specimen  and confirmed on repeat testing.  While 2019 novel coronavirus  (SARS-CoV-2) nucleic acids may be present in the submitted sample  additional confirmatory testing may be necessary for epidemiological  and / or clinical management purposes  to differentiate between  SARS-CoV-2 and other Sarbecovirus  currently known to infect humans.  If clinically indicated additional testing with an alternate test  methodology [phone]) is advised. The SARS-CoV-2 RNA is generally  detectable in upper and lower respiratory sp ecimens during the acute  phase of infection. The expected result is Negative. Fact Sheet for Patients:  StrictlyIdeas.no Fact Sheet for Healthcare Providers: BankingDealers.co.za This test is not yet approved or cleared by the [place] FDA and has been authorized for detection and/or diagnosis of SARS-CoV-2 by FDA under an Emergency Use Authorization (EUA).  This EUA will remain in effect (meaning this test can be used) for the duration of the COVID-19 declaration under Section 564(b)(1) of the Act, 21 U.S.C. section 360bbb-3(b)(1), unless the authorization is terminated or revoked sooner. Performed at [facility], [address], [place], [place] [address]   MRSA PCR Screening     Status: None   Collection Time: 06/13/19  5:08 PM   Specimen: Nasal Mucosa; Nasopharyngeal  Result Value Ref Range Status   MRSA by PCR NEGATIVE NEGATIVE Final    Comment:        The GeneXpert MRSA Assay (FDA approved for NASAL specimens only), is one component of a comprehensive MRSA colonization surveillance program. It is not intended to diagnose MRSA infection nor to guide or monitor treatment for MRSA infections. Performed at [facility], [address] [place], [place] [address]   Aerobic/Anaerobic Culture (surgical/deep wound)     Status: None (Preliminary result)   Collection Time: 06/13/19  6:14 PM   Specimen: Wound  Result Value Ref Range Status   Specimen Description   Final    WOUND Performed at [facility], [address] [place], [place] [address]    Special Requests   Final    Normal Performed at [facility], [address],  [place], [place] [address]    Gram Stain   Final    CORRECTED RESULTS ABUNDANT WBC  PRESENT, PREDOMINANTLY PMN ABUNDANT GRAM POSITIVE COCCI PREVIOUSLY REPORTED AS: NO ORGANISMS SEEN MODERATE WBC PRESENT, PREDOMINANTLY PMN Performed at [facility]: RN Nash Mantis [phone] FCP    Culture   Final    FEW STAPHYLOCOCCUS AUREUS SUSCEPTIBILITIES TO FOLLOW Performed at [facility], [place] [address] [place], [place] [address]    Report Status PENDING  Incomplete       Today   Subjective    Natalie Contreras today has no new concerns No fever  Or chills  nausea, vomiting, diarrhea          Patient has been seen and examined prior to discharge   Objective   Blood pressure 95/73, pulse 68, temperature 97.9 F (36.6 C), temperature source Oral, resp. rate 19, height 5\' 4"  (1.626 m), weight 98.4 kg, last menstrual period 12/16/2015, SpO2 98 %.   Intake/Output Summary (Last 24 hours) at 06/15/2019 1223 Last data filed at 06/15/2019 0900 Gross per 24 hour  Intake 1683.27 ml  Output -  Net 1683.27 ml    Exam Gen:- Awake Alert, no acute distress  HEENT:- [place].AT, No sclera icterus Neck-Supple Neck,No JVD,.  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 she also has the big toe left -MSK--right foot blister is resolving, much improved erythema warmth and swelling   Data Review   CBC w Diff:  Lab Results  Component Value Date   WBC 7.7 06/14/2019   HGB 12.2 06/14/2019   HCT 38.9 06/14/2019   PLT 320 06/14/2019   LYMPHOPCT 20 06/13/2019   MONOPCT 5 06/13/2019   EOSPCT 2 06/13/2019   BASOPCT 1 06/13/2019    CMP:  Lab Results  Component Value Date   NA 136 06/15/2019   K 3.9 06/15/2019   CL 102 06/15/2019   CO2 21 (L) 06/15/2019   BUN 46 (H) 06/15/2019   CREATININE 1.24  (H) 06/15/2019   CREATININE 0.83 03/04/2017   PROT 7.2 07/17/2017   ALBUMIN 2.7 (L) 07/17/2017   BILITOT 0.7 07/17/2017   ALKPHOS 313 (H) 07/17/2017   AST 36 07/17/2017   ALT 42 07/17/2017  .   Total Discharge time is about 33 minutes  Roxan Hockey M.D on 06/15/2019 at 12:23 PM  Go to [email or web address] -  for contact info  [facility] - Office  [phone]

## 2019-06-20 LAB — AEROBIC/ANAEROBIC CULTURE W GRAM STAIN (SURGICAL/DEEP WOUND): Special Requests: NORMAL

## 2019-07-21 ENCOUNTER — Other Ambulatory Visit: Payer: Self-pay

## 2019-07-21 ENCOUNTER — Other Ambulatory Visit (HOSPITAL_COMMUNITY): Payer: Self-pay | Admitting: Adult Health Nurse Practitioner

## 2019-07-21 ENCOUNTER — Ambulatory Visit (HOSPITAL_COMMUNITY)
Admission: RE | Admit: 2019-07-21 | Discharge: 2019-07-21 | Disposition: A | Discharge status: Home / Self Care | Payer: Medicaid Other | Source: Ambulatory Visit | Attending: Adult Health Nurse Practitioner | Admitting: Adult Health Nurse Practitioner

## 2019-07-21 DIAGNOSIS — M79671 Pain in right foot: Secondary | ICD-10-CM

## 2019-07-21 IMAGING — DX DG FOOT COMPLETE 3+V*R*
3 series · 3 of 3 positions shown · non-contrast
Comparison: Radiographs dated [DATE]

CLINICAL DATA: Right foot pain and swelling with nonhealing soft
tissue.

EXAM:
RIGHT FOOT COMPLETE - 3+ VIEW

[foot ap]
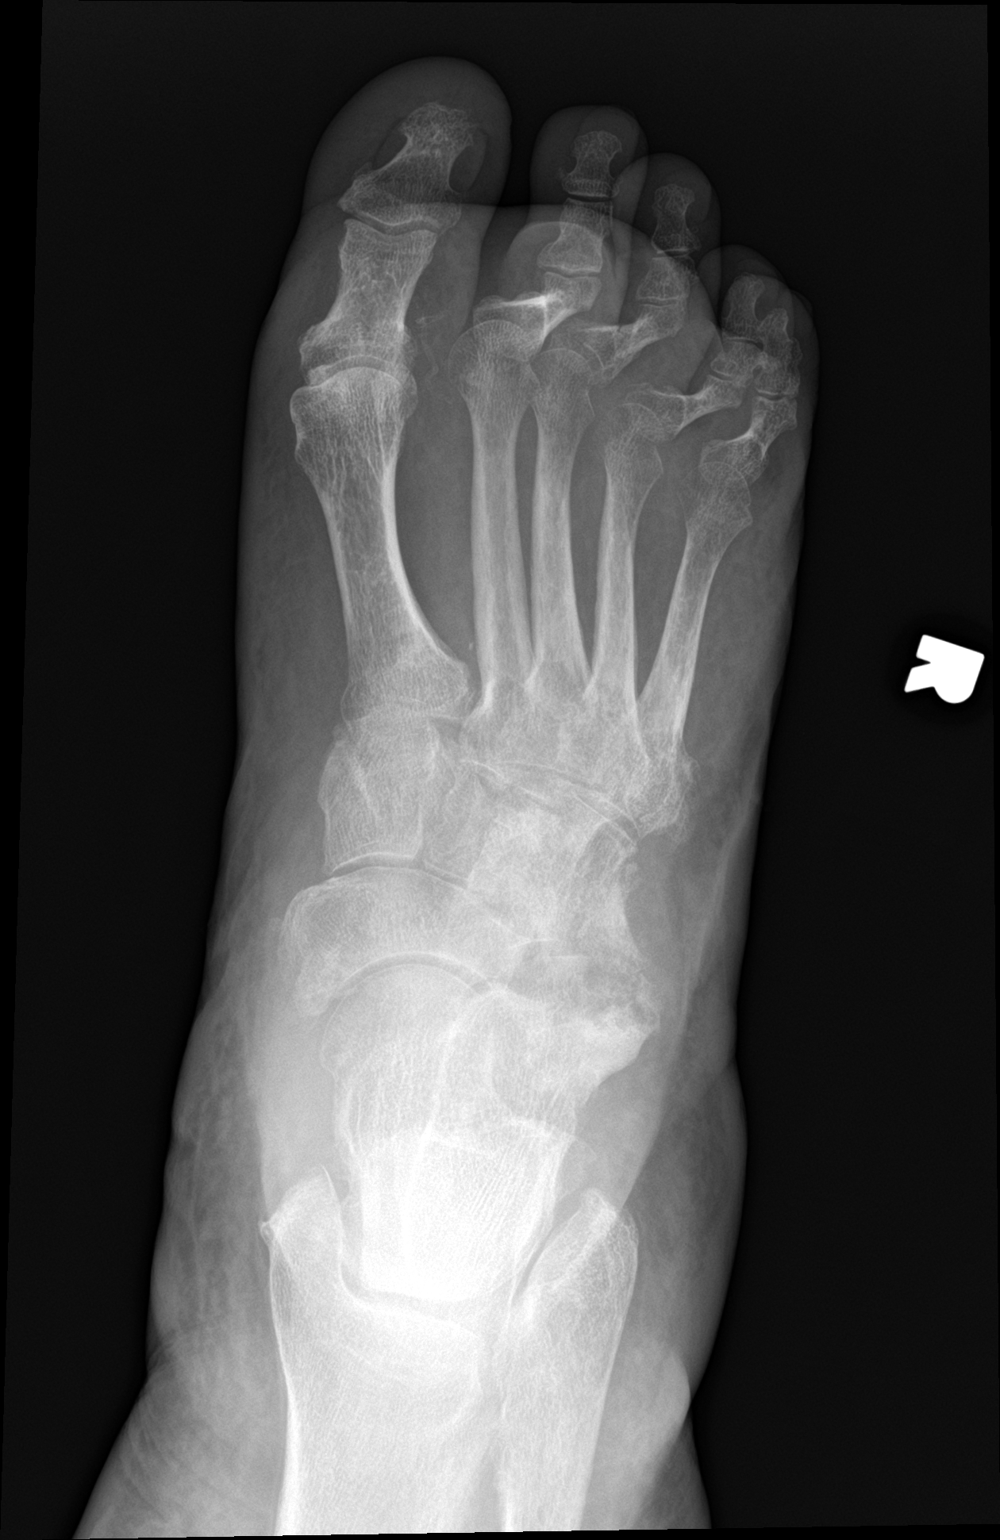

[foot obl]
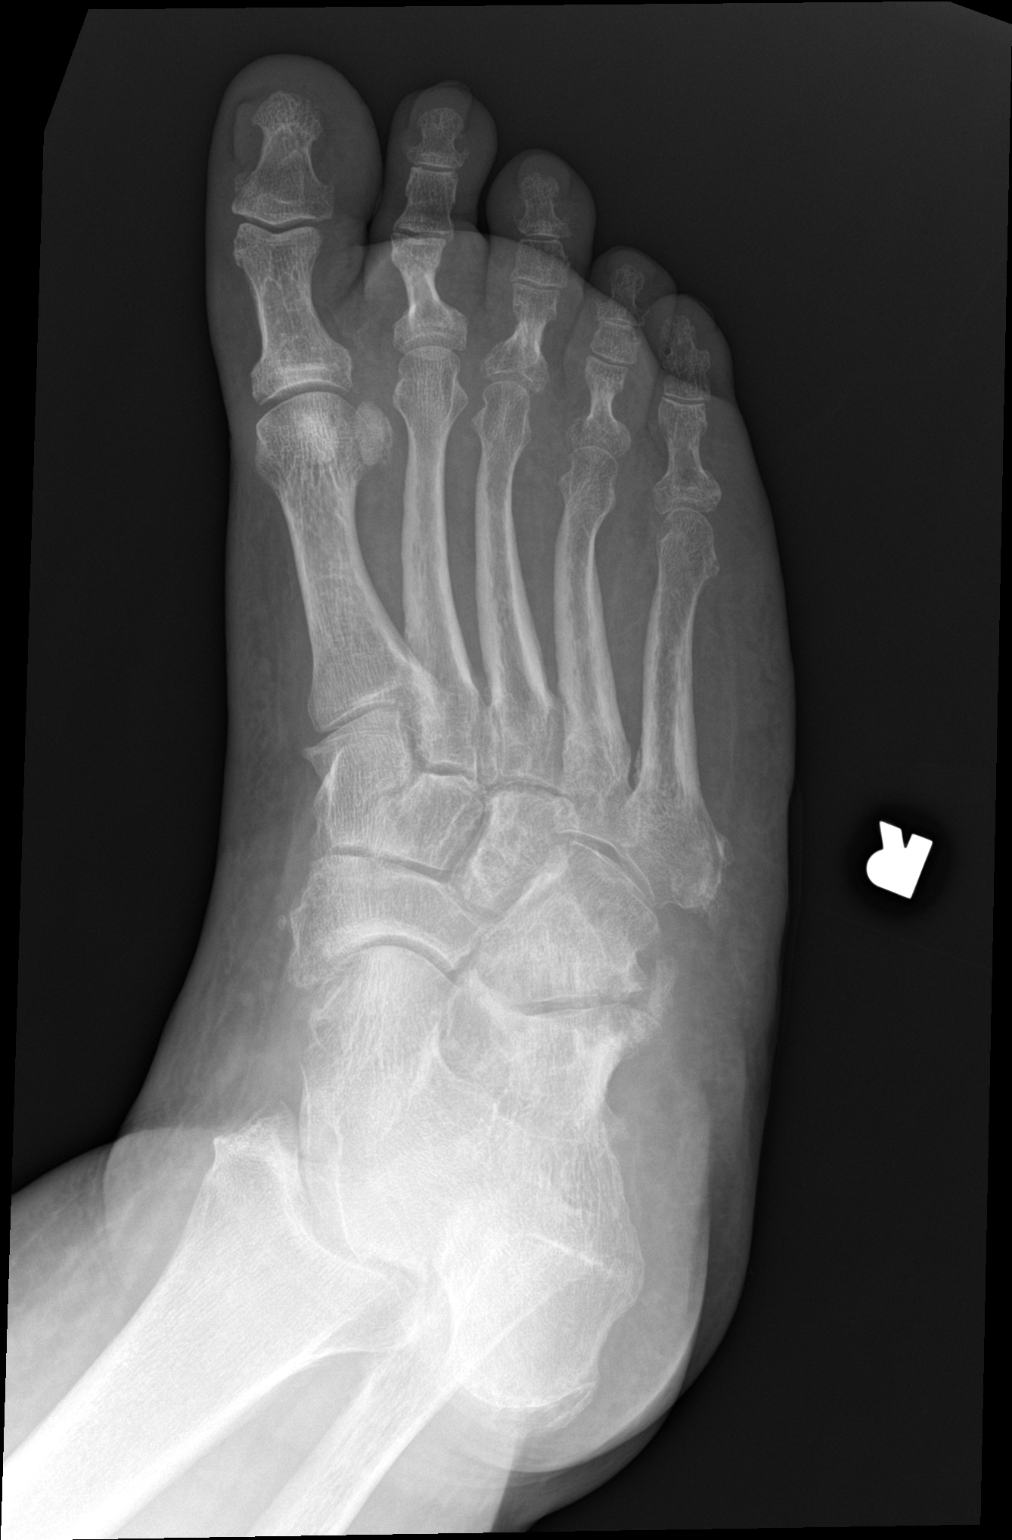

[foot lat]
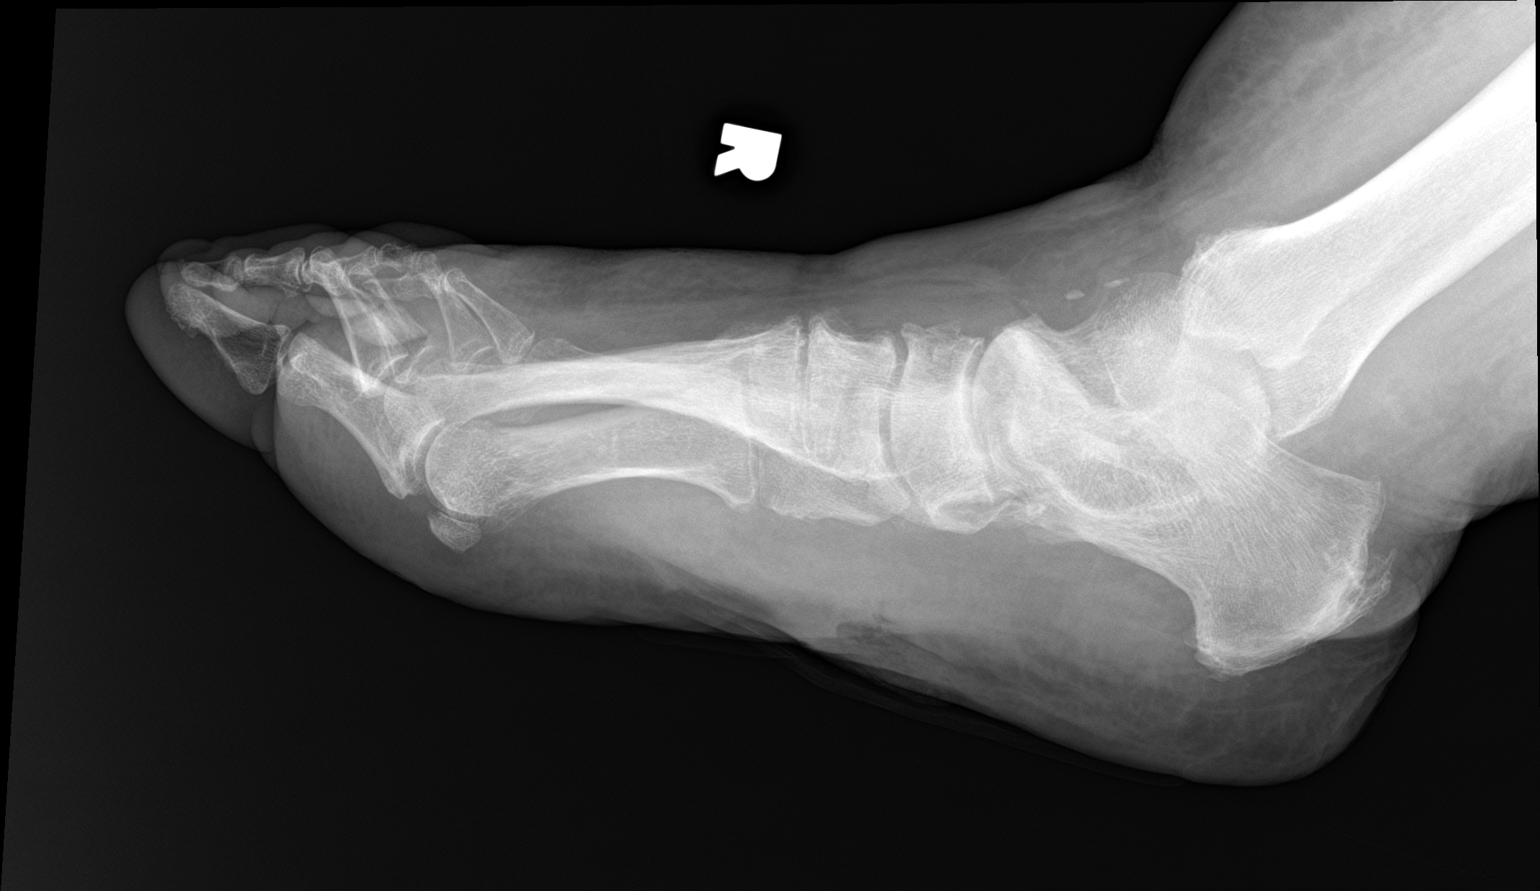

[3 of 3 positions shown; findings below may reference images not displayed]

FINDINGS: There is no evidence of osteomyelitis. There are prominent
degenerative changes in the midfoot, stable. There is an old
fracture of the midshaft of the proximal phalanx of the third toe.
There is soft tissue swelling diffusely in the foot and around the
ankle.
IMPRESSION: 1. No evidence of osteomyelitis.
2. Soft tissue swelling.
3. Stable degenerative changes in the midfoot.

## 2019-07-30 ENCOUNTER — Other Ambulatory Visit (HOSPITAL_COMMUNITY): Payer: Self-pay | Admitting: Internal Medicine

## 2019-07-30 ENCOUNTER — Other Ambulatory Visit: Payer: Self-pay | Admitting: Internal Medicine

## 2019-07-30 DIAGNOSIS — S91341D Puncture wound with foreign body, right foot, subsequent encounter: Secondary | ICD-10-CM

## 2019-07-30 DIAGNOSIS — S91301D Unspecified open wound, right foot, subsequent encounter: Secondary | ICD-10-CM

## 2019-08-08 ENCOUNTER — Other Ambulatory Visit: Payer: Self-pay

## 2019-08-08 ENCOUNTER — Inpatient Hospital Stay (HOSPITAL_COMMUNITY)
Admission: EM | Admit: 2019-08-08 | Discharge: 2019-08-14 | DRG: 628 | Disposition: A | Discharge status: Home Health | Payer: Medicaid Other | Attending: Internal Medicine | Admitting: Internal Medicine

## 2019-08-08 ENCOUNTER — Encounter (HOSPITAL_COMMUNITY): Payer: Self-pay

## 2019-08-08 ENCOUNTER — Emergency Department (HOSPITAL_COMMUNITY): Payer: Medicaid Other

## 2019-08-08 DIAGNOSIS — L089 Local infection of the skin and subcutaneous tissue, unspecified: Secondary | ICD-10-CM

## 2019-08-08 DIAGNOSIS — L02611 Cutaneous abscess of right foot: Secondary | ICD-10-CM | POA: Diagnosis present

## 2019-08-08 DIAGNOSIS — E1152 Type 2 diabetes mellitus with diabetic peripheral angiopathy with gangrene: Secondary | ICD-10-CM | POA: Diagnosis present

## 2019-08-08 DIAGNOSIS — E11628 Type 2 diabetes mellitus with other skin complications: Secondary | ICD-10-CM | POA: Diagnosis not present

## 2019-08-08 DIAGNOSIS — I6523 Occlusion and stenosis of bilateral carotid arteries: Secondary | ICD-10-CM | POA: Diagnosis present

## 2019-08-08 DIAGNOSIS — N179 Acute kidney failure, unspecified: Secondary | ICD-10-CM | POA: Diagnosis present

## 2019-08-08 DIAGNOSIS — E876 Hypokalemia: Secondary | ICD-10-CM | POA: Diagnosis present

## 2019-08-08 DIAGNOSIS — I739 Peripheral vascular disease, unspecified: Secondary | ICD-10-CM | POA: Diagnosis not present

## 2019-08-08 DIAGNOSIS — E43 Unspecified severe protein-calorie malnutrition: Secondary | ICD-10-CM | POA: Diagnosis present

## 2019-08-08 DIAGNOSIS — E11621 Type 2 diabetes mellitus with foot ulcer: Principal | ICD-10-CM | POA: Diagnosis present

## 2019-08-08 DIAGNOSIS — E1169 Type 2 diabetes mellitus with other specified complication: Secondary | ICD-10-CM | POA: Diagnosis present

## 2019-08-08 DIAGNOSIS — E1140 Type 2 diabetes mellitus with diabetic neuropathy, unspecified: Secondary | ICD-10-CM | POA: Diagnosis present

## 2019-08-08 DIAGNOSIS — Z20828 Contact with and (suspected) exposure to other viral communicable diseases: Secondary | ICD-10-CM | POA: Diagnosis present

## 2019-08-08 DIAGNOSIS — IMO0002 Reserved for concepts with insufficient information to code with codable children: Secondary | ICD-10-CM

## 2019-08-08 DIAGNOSIS — Z22330 Carrier of Group B streptococcus: Secondary | ICD-10-CM

## 2019-08-08 DIAGNOSIS — Z794 Long term (current) use of insulin: Secondary | ICD-10-CM

## 2019-08-08 DIAGNOSIS — Z23 Encounter for immunization: Secondary | ICD-10-CM | POA: Diagnosis not present

## 2019-08-08 DIAGNOSIS — N1831 Chronic kidney disease, stage 3a: Secondary | ICD-10-CM | POA: Diagnosis present

## 2019-08-08 DIAGNOSIS — I5032 Chronic diastolic (congestive) heart failure: Secondary | ICD-10-CM | POA: Diagnosis present

## 2019-08-08 DIAGNOSIS — I130 Hypertensive heart and chronic kidney disease with heart failure and stage 1 through stage 4 chronic kidney disease, or unspecified chronic kidney disease: Secondary | ICD-10-CM | POA: Diagnosis present

## 2019-08-08 DIAGNOSIS — Z6836 Body mass index (BMI) 36.0-36.9, adult: Secondary | ICD-10-CM | POA: Diagnosis not present

## 2019-08-08 DIAGNOSIS — Z720 Tobacco use: Secondary | ICD-10-CM | POA: Diagnosis present

## 2019-08-08 DIAGNOSIS — L03115 Cellulitis of right lower limb: Secondary | ICD-10-CM | POA: Diagnosis present

## 2019-08-08 DIAGNOSIS — E860 Dehydration: Secondary | ICD-10-CM | POA: Diagnosis present

## 2019-08-08 DIAGNOSIS — L02619 Cutaneous abscess of unspecified foot: Secondary | ICD-10-CM | POA: Diagnosis not present

## 2019-08-08 DIAGNOSIS — I10 Essential (primary) hypertension: Secondary | ICD-10-CM | POA: Diagnosis present

## 2019-08-08 DIAGNOSIS — E1165 Type 2 diabetes mellitus with hyperglycemia: Secondary | ICD-10-CM | POA: Diagnosis present

## 2019-08-08 DIAGNOSIS — Z01810 Encounter for preprocedural cardiovascular examination: Secondary | ICD-10-CM | POA: Diagnosis not present

## 2019-08-08 DIAGNOSIS — F17210 Nicotine dependence, cigarettes, uncomplicated: Secondary | ICD-10-CM | POA: Diagnosis present

## 2019-08-08 DIAGNOSIS — Z7982 Long term (current) use of aspirin: Secondary | ICD-10-CM

## 2019-08-08 DIAGNOSIS — L03119 Cellulitis of unspecified part of limb: Secondary | ICD-10-CM | POA: Diagnosis not present

## 2019-08-08 DIAGNOSIS — E669 Obesity, unspecified: Secondary | ICD-10-CM | POA: Diagnosis present

## 2019-08-08 DIAGNOSIS — I96 Gangrene, not elsewhere classified: Secondary | ICD-10-CM | POA: Diagnosis present

## 2019-08-08 DIAGNOSIS — E1122 Type 2 diabetes mellitus with diabetic chronic kidney disease: Secondary | ICD-10-CM | POA: Diagnosis present

## 2019-08-08 DIAGNOSIS — I5033 Acute on chronic diastolic (congestive) heart failure: Secondary | ICD-10-CM | POA: Diagnosis present

## 2019-08-08 DIAGNOSIS — Z801 Family history of malignant neoplasm of trachea, bronchus and lung: Secondary | ICD-10-CM

## 2019-08-08 DIAGNOSIS — M86271 Subacute osteomyelitis, right ankle and foot: Secondary | ICD-10-CM | POA: Diagnosis present

## 2019-08-08 DIAGNOSIS — Z79899 Other long term (current) drug therapy: Secondary | ICD-10-CM

## 2019-08-08 DIAGNOSIS — Z89422 Acquired absence of other left toe(s): Secondary | ICD-10-CM

## 2019-08-08 DIAGNOSIS — I5031 Acute diastolic (congestive) heart failure: Secondary | ICD-10-CM | POA: Diagnosis not present

## 2019-08-08 DIAGNOSIS — N289 Disorder of kidney and ureter, unspecified: Secondary | ICD-10-CM

## 2019-08-08 LAB — BASIC METABOLIC PANEL
Anion gap: 14 (ref 5–15)
BUN: 37 mg/dL — ABNORMAL HIGH (ref 6–20)
CO2: 27 mmol/L (ref 22–32)
Calcium: 8.6 mg/dL — ABNORMAL LOW (ref 8.9–10.3)
Chloride: 95 mmol/L — ABNORMAL LOW (ref 98–111)
Creatinine, Ser: 1.73 mg/dL — ABNORMAL HIGH (ref 0.44–1.00)
GFR calc Af Amer: 39 mL/min — ABNORMAL LOW (ref >60)
GFR calc non Af Amer: 34 mL/min — ABNORMAL LOW (ref >60)
Glucose, Bld: 180 mg/dL — ABNORMAL HIGH (ref 70–99)
Potassium: 3.1 mmol/L — ABNORMAL LOW (ref 3.5–5.1)
Sodium: 136 mmol/L (ref 135–145)

## 2019-08-08 LAB — C-REACTIVE PROTEIN: CRP: 11.7 mg/dL — ABNORMAL HIGH (ref <1.0)

## 2019-08-08 LAB — CBC WITH DIFFERENTIAL/PLATELET
Abs Immature Granulocytes: 0.07 10*3/uL (ref 0.00–0.07)
Basophils Absolute: 0.1 10*3/uL (ref 0.0–0.1)
Basophils Relative: 0 %
Eosinophils Absolute: 0.1 10*3/uL (ref 0.0–0.5)
Eosinophils Relative: 1 %
HCT: 34.5 % — ABNORMAL LOW (ref 36.0–46.0)
Hemoglobin: 10.6 g/dL — ABNORMAL LOW (ref 12.0–15.0)
Immature Granulocytes: 1 %
Lymphocytes Relative: 13 %
Lymphs Abs: 1.5 10*3/uL (ref 0.7–4.0)
MCH: 26.9 pg (ref 26.0–34.0)
MCHC: 30.7 g/dL (ref 30.0–36.0)
MCV: 87.6 fL (ref 80.0–100.0)
Monocytes Absolute: 0.5 10*3/uL (ref 0.1–1.0)
Monocytes Relative: 5 %
Neutro Abs: 9.2 10*3/uL — ABNORMAL HIGH (ref 1.7–7.7)
Neutrophils Relative %: 80 %
Platelets: 508 10*3/uL — ABNORMAL HIGH (ref 150–400)
RBC: 3.94 MIL/uL (ref 3.87–5.11)
RDW: 15.9 % — ABNORMAL HIGH (ref 11.5–15.5)
WBC: 11.5 10*3/uL — ABNORMAL HIGH (ref 4.0–10.5)
nRBC: 0 % (ref 0.0–0.2)

## 2019-08-08 LAB — SEDIMENTATION RATE: Sed Rate: 122 mm/hr — ABNORMAL HIGH (ref 0–22)

## 2019-08-08 LAB — GLUCOSE, CAPILLARY
Glucose-Capillary: 169 mg/dL — ABNORMAL HIGH (ref 70–99)
Glucose-Capillary: 169 mg/dL — ABNORMAL HIGH (ref 70–99)

## 2019-08-08 IMAGING — DX DG FOOT COMPLETE 3+V*R*
3 series · 3 of 3 positions shown · non-contrast
Comparison: [DATE]

CLINICAL DATA: Wounds on right foot for the past month.

EXAM:
RIGHT FOOT COMPLETE - 3+ VIEW

[foot ap]
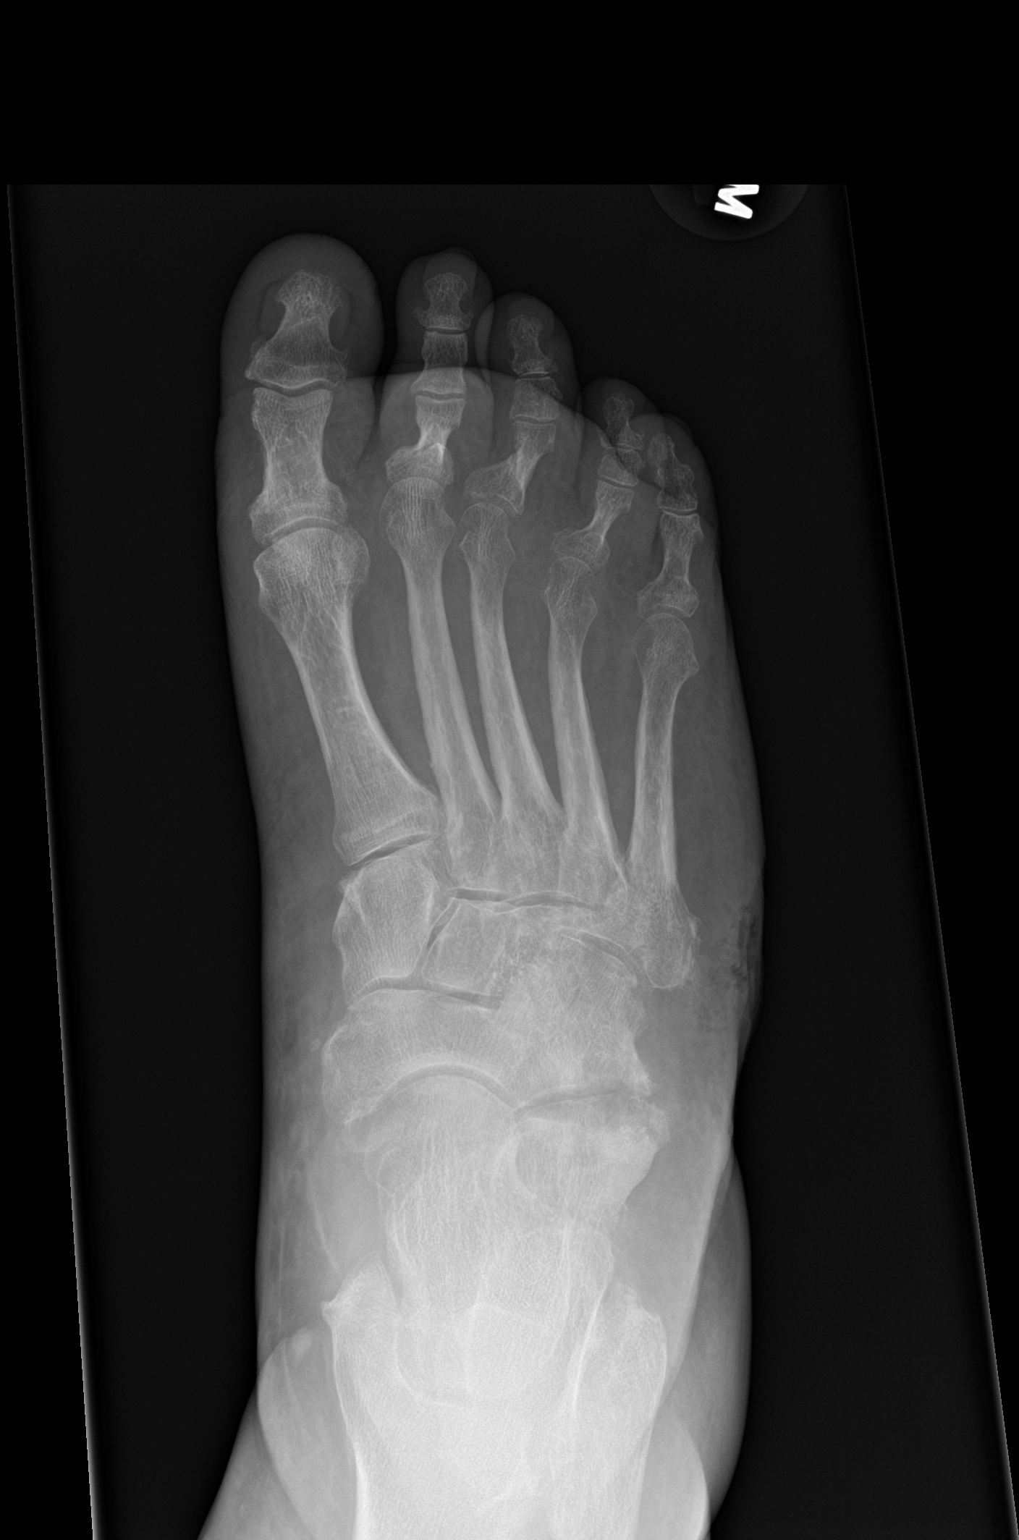

[foot obl]
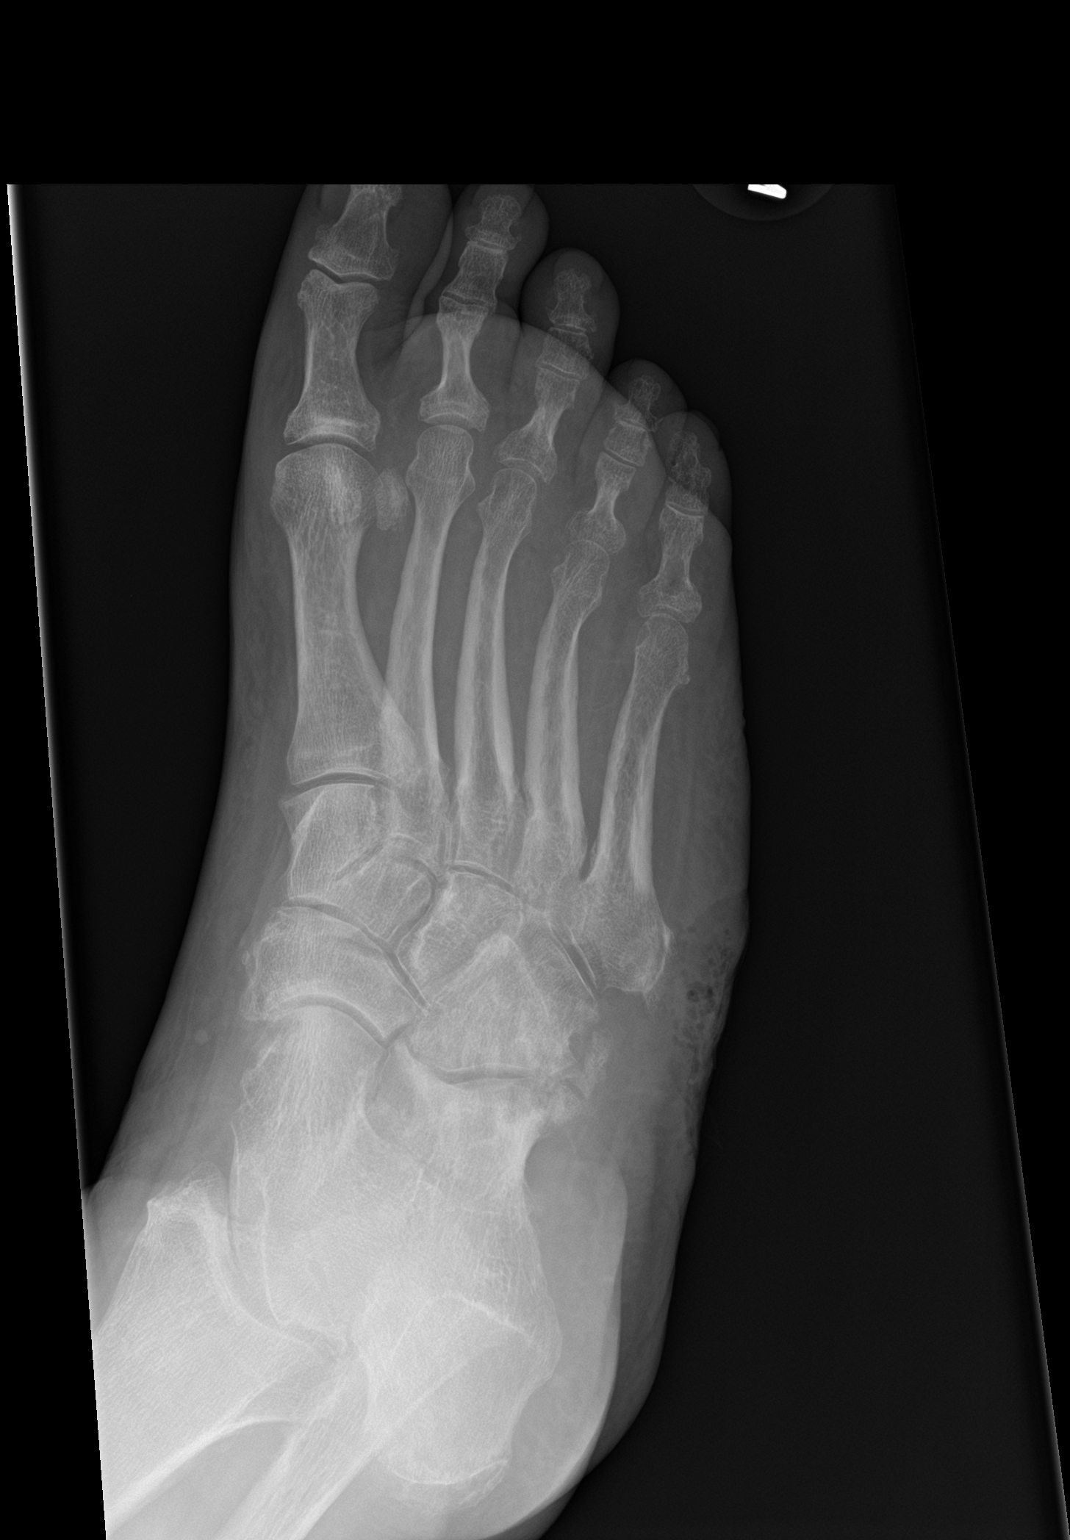

[foot lat]
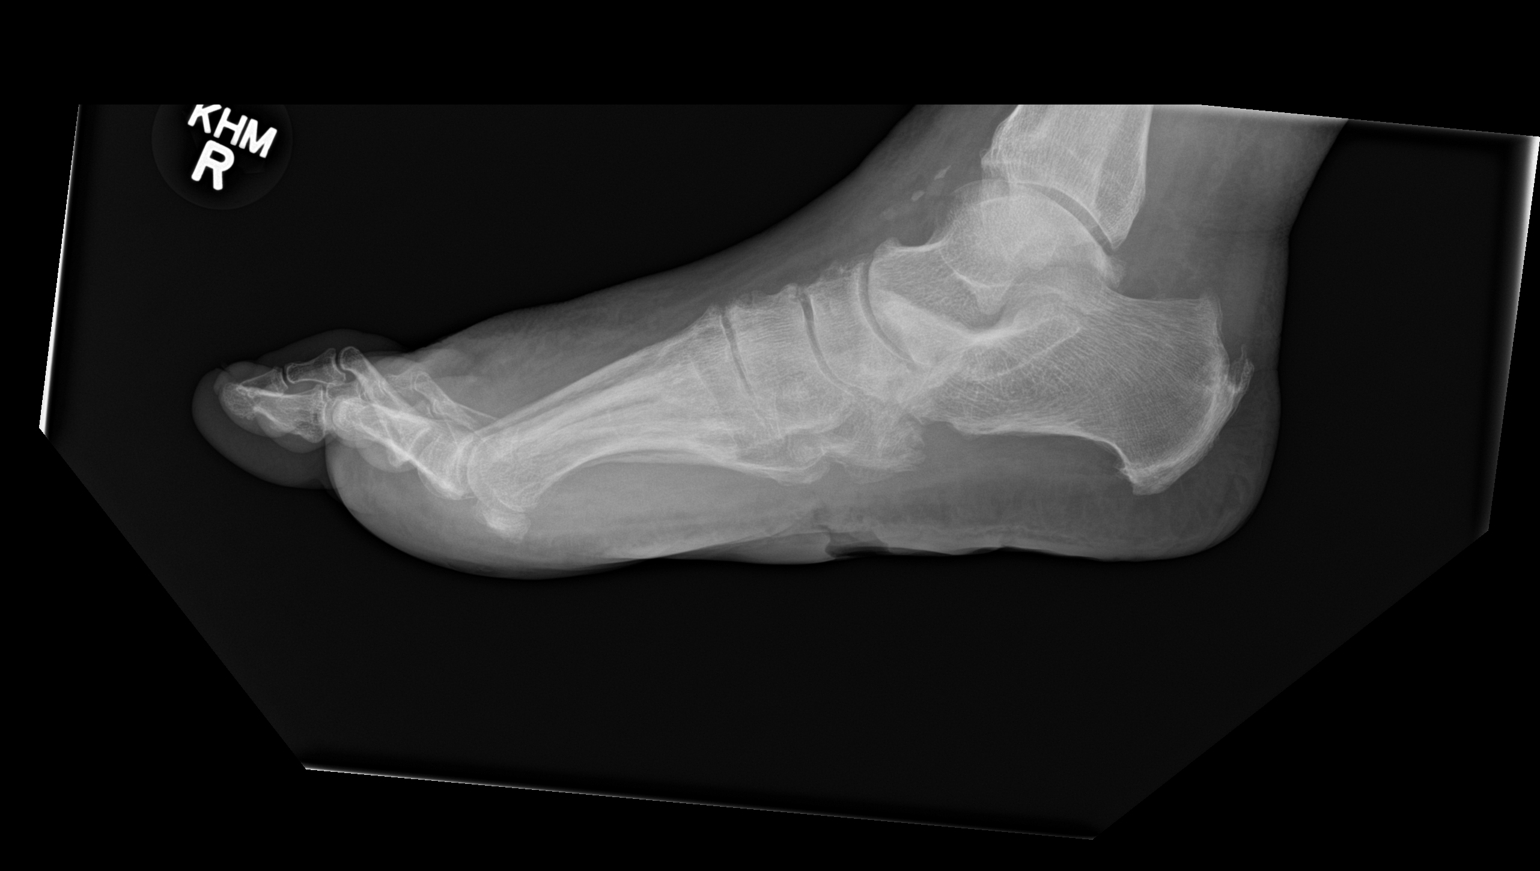

[3 of 3 positions shown; findings below may reference images not displayed]

FINDINGS: Diffuse soft tissue swelling. Soft tissue defect about the plantar
surface of the midfoot on the lateral view. Soft tissue defect about
the lateral mid foot on the AP and oblique views may represent the
same area as on the lateral.

Small Achilles and calcaneal spurs.  Vascular calcifications.

No osseous destruction.  Midfoot degenerative changes.
IMPRESSION: Soft tissue swelling with at least 1 soft tissue ulcer about the
plantar midfoot as detailed above. No plain film evidence of
osteomyelitis.

## 2019-08-08 MED ORDER — ACETAMINOPHEN 325 MG PO TABS
650.0000 mg | ORAL_TABLET | Freq: Four times a day (QID) | ORAL | Status: DC | PRN
  Administered 2019-08-08 – 2019-08-09 (×2): 650 mg via ORAL
  Filled 2019-08-08 (×2): qty 2

## 2019-08-08 MED ORDER — OXYCODONE HCL 5 MG PO TABS
5.0000 mg | ORAL_TABLET | ORAL | Status: DC | PRN
  Administered 2019-08-08: 5 mg via ORAL
  Administered 2019-08-09 – 2019-08-14 (×12): 10 mg via ORAL
  Filled 2019-08-08: qty 1
  Filled 2019-08-08 (×12): qty 2

## 2019-08-08 MED ORDER — INSULIN ASPART 100 UNIT/ML ~~LOC~~ SOLN
0.0000 [IU] | Freq: Three times a day (TID) | SUBCUTANEOUS | Status: DC
  Administered 2019-08-08: 18:00:00 3 [IU] via SUBCUTANEOUS
  Administered 2019-08-09 (×2): 2 [IU] via SUBCUTANEOUS
  Administered 2019-08-11: 17:00:00 5 [IU] via SUBCUTANEOUS
  Administered 2019-08-12: 3 [IU] via SUBCUTANEOUS
  Administered 2019-08-12: 2 [IU] via SUBCUTANEOUS
  Administered 2019-08-12: 3 [IU] via SUBCUTANEOUS
  Administered 2019-08-13 (×2): 2 [IU] via SUBCUTANEOUS
  Administered 2019-08-14 (×2): 3 [IU] via SUBCUTANEOUS

## 2019-08-08 MED ORDER — HEPARIN SODIUM (PORCINE) 5000 UNIT/ML IJ SOLN
5000.0000 [IU] | Freq: Three times a day (TID) | INTRAMUSCULAR | Status: DC
  Administered 2019-08-08 – 2019-08-09 (×5): 5000 [IU] via SUBCUTANEOUS
  Filled 2019-08-08 (×4): qty 1

## 2019-08-08 MED ORDER — VANCOMYCIN HCL 10 G IV SOLR
1250.0000 mg | INTRAVENOUS | Status: DC
  Filled 2019-08-08: qty 1250

## 2019-08-08 MED ORDER — ONDANSETRON HCL 4 MG/2ML IJ SOLN
4.0000 mg | Freq: Four times a day (QID) | INTRAMUSCULAR | Status: DC | PRN
  Administered 2019-08-11: 4 mg via INTRAVENOUS

## 2019-08-08 MED ORDER — ASPIRIN EC 81 MG PO TBEC
81.0000 mg | DELAYED_RELEASE_TABLET | Freq: Every day | ORAL | Status: DC
  Administered 2019-08-09 – 2019-08-14 (×5): 81 mg via ORAL
  Filled 2019-08-08 (×5): qty 1

## 2019-08-08 MED ORDER — CARVEDILOL 3.125 MG PO TABS
3.1250 mg | ORAL_TABLET | Freq: Two times a day (BID) | ORAL | Status: DC
  Administered 2019-08-08 – 2019-08-14 (×12): 3.125 mg via ORAL
  Filled 2019-08-08 (×12): qty 1

## 2019-08-08 MED ORDER — ONDANSETRON HCL 4 MG PO TABS: 4.0000 mg | ORAL_TABLET | Freq: Four times a day (QID) | ORAL | Status: DC | PRN

## 2019-08-08 MED ORDER — SODIUM CHLORIDE 0.9% FLUSH
3.0000 mL | Freq: Two times a day (BID) | INTRAVENOUS | Status: DC
  Administered 2019-08-08 – 2019-08-12 (×4): 3 mL via INTRAVENOUS

## 2019-08-08 MED ORDER — POLYETHYLENE GLYCOL 3350 17 G PO PACK
17.0000 g | PACK | Freq: Two times a day (BID) | ORAL | Status: DC
  Administered 2019-08-08 – 2019-08-14 (×8): 17 g via ORAL
  Filled 2019-08-08 (×10): qty 1

## 2019-08-08 MED ORDER — INSULIN ASPART 100 UNIT/ML ~~LOC~~ SOLN
3.0000 [IU] | Freq: Three times a day (TID) | SUBCUTANEOUS | Status: DC
  Administered 2019-08-08 – 2019-08-14 (×12): 3 [IU] via SUBCUTANEOUS

## 2019-08-08 MED ORDER — VANCOMYCIN HCL IN DEXTROSE 1-5 GM/200ML-% IV SOLN
1000.0000 mg | Freq: Once | INTRAVENOUS | Status: AC
  Administered 2019-08-08: 10:00:00 1000 mg via INTRAVENOUS
  Filled 2019-08-08: qty 200

## 2019-08-08 MED ORDER — LABETALOL HCL 5 MG/ML IV SOLN: 10.0000 mg | INTRAVENOUS | Status: DC | PRN

## 2019-08-08 MED ORDER — SODIUM CHLORIDE 0.9 % IV SOLN
2.0000 g | Freq: Two times a day (BID) | INTRAVENOUS | Status: DC
  Administered 2019-08-08 – 2019-08-13 (×9): 2 g via INTRAVENOUS
  Filled 2019-08-08 (×11): qty 2

## 2019-08-08 MED ORDER — SIMVASTATIN 20 MG PO TABS
40.0000 mg | ORAL_TABLET | Freq: Every day | ORAL | Status: DC
  Administered 2019-08-08 – 2019-08-14 (×6): 40 mg via ORAL
  Filled 2019-08-08: qty 2
  Filled 2019-08-08: qty 4
  Filled 2019-08-08 (×5): qty 2

## 2019-08-08 MED ORDER — SODIUM CHLORIDE 0.9 % IV SOLN
2.0000 g | Freq: Once | INTRAVENOUS | Status: AC
  Administered 2019-08-08: 11:00:00 2 g via INTRAVENOUS
  Filled 2019-08-08: qty 2

## 2019-08-08 MED ORDER — GABAPENTIN 300 MG PO CAPS
300.0000 mg | ORAL_CAPSULE | Freq: Three times a day (TID) | ORAL | Status: DC
  Administered 2019-08-08 – 2019-08-14 (×18): 300 mg via ORAL
  Filled 2019-08-08 (×18): qty 1

## 2019-08-08 MED ORDER — SODIUM CHLORIDE 0.9 % IV SOLN: 250.0000 mL | INTRAVENOUS | Status: DC | PRN

## 2019-08-08 MED ORDER — NICOTINE 14 MG/24HR TD PT24
14.0000 mg | MEDICATED_PATCH | Freq: Every day | TRANSDERMAL | Status: DC
  Administered 2019-08-08 – 2019-08-14 (×6): 14 mg via TRANSDERMAL
  Filled 2019-08-08 (×6): qty 1

## 2019-08-08 MED ORDER — TRAZODONE HCL 50 MG PO TABS: 50.0000 mg | ORAL_TABLET | Freq: Every evening | ORAL | Status: DC | PRN

## 2019-08-08 MED ORDER — LACTULOSE 10 GM/15ML PO SOLN
30.0000 g | Freq: Once | ORAL | Status: AC
  Administered 2019-08-08: 15:00:00 30 g via ORAL
  Filled 2019-08-08: qty 45

## 2019-08-08 MED ORDER — ALBUTEROL SULFATE (2.5 MG/3ML) 0.083% IN NEBU: 2.5000 mg | INHALATION_SOLUTION | RESPIRATORY_TRACT | Status: DC | PRN

## 2019-08-08 MED ORDER — POTASSIUM CHLORIDE CRYS ER 20 MEQ PO TBCR
20.0000 meq | EXTENDED_RELEASE_TABLET | Freq: Once | ORAL | Status: AC
  Administered 2019-08-08: 20 meq via ORAL
  Filled 2019-08-08: qty 1

## 2019-08-08 MED ORDER — SODIUM CHLORIDE 0.9% FLUSH: 3.0000 mL | INTRAVENOUS | Status: DC | PRN

## 2019-08-08 MED ORDER — VANCOMYCIN HCL IN DEXTROSE 1-5 GM/200ML-% IV SOLN
1000.0000 mg | Freq: Once | INTRAVENOUS | Status: AC
  Administered 2019-08-08: 1000 mg via INTRAVENOUS
  Filled 2019-08-08: qty 200

## 2019-08-08 MED ORDER — SODIUM CHLORIDE 0.9 % IV SOLN
INTRAVENOUS | Status: DC
  Administered 2019-08-08: 75 mL/h via INTRAVENOUS
  Administered 2019-08-08: 19:00:00 via INTRAVENOUS
  Administered 2019-08-09: 75 mL/h via INTRAVENOUS

## 2019-08-08 MED ORDER — SODIUM CHLORIDE 0.9 % IV BOLUS
1000.0000 mL | Freq: Once | INTRAVENOUS | Status: AC
  Administered 2019-08-08: 1000 mL via INTRAVENOUS

## 2019-08-08 MED ORDER — POLYETHYLENE GLYCOL 3350 17 G PO PACK: 17.0000 g | PACK | Freq: Every day | ORAL | Status: DC | PRN

## 2019-08-08 MED ORDER — INSULIN DETEMIR 100 UNIT/ML ~~LOC~~ SOLN
35.0000 [IU] | Freq: Every day | SUBCUTANEOUS | Status: DC
  Administered 2019-08-08 – 2019-08-13 (×6): 35 [IU] via SUBCUTANEOUS
  Filled 2019-08-08 (×8): qty 0.35

## 2019-08-08 MED ORDER — INSULIN ASPART 100 UNIT/ML ~~LOC~~ SOLN: 0.0000 [IU] | Freq: Every day | SUBCUTANEOUS | Status: DC

## 2019-08-08 MED ORDER — GLIMEPIRIDE 2 MG PO TABS: 2.0000 mg | ORAL_TABLET | Freq: Every day | ORAL | Status: DC

## 2019-08-08 MED ORDER — TORSEMIDE 20 MG PO TABS: 20.0000 mg | ORAL_TABLET | Freq: Every day | ORAL | Status: DC

## 2019-08-08 NOTE — ED Triage Notes (Signed)
Pt reports has wound to r foot for the past month.  Reports has been on 3 different antibiotics but nothing is helping.

## 2019-08-08 NOTE — ED Notes (Signed)
Attempt to give report  Asked to call back in 15 minutes as nurse was at lunch and CN in a rapid response  Informed that pt is enroute by [facility] and request for RN or CN to call when available to receive report

## 2019-08-08 NOTE — ED Provider Notes (Signed)
Medical screening examination/treatment/procedure(s) were conducted as a shared visit with non-physician practitioner(s) and myself.  I personally evaluated the patient during the encounter.    Pt has history of PVD and DM.  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  Non toxic appearing but will likely require admission for IV abx, further vascular workup, consider MR to rule out osteo.   Dorie Rank, MD 08/08/19 [phone]

## 2019-08-08 NOTE — Plan of Care (Signed)

## 2019-08-08 NOTE — ED Provider Notes (Signed)
[facility] EMERGENCY DEPARTMENT Provider Note   CSN: [id number] Arrival date & time: 08/08/19  0815     History   Chief Complaint Chief Complaint  Patient presents with  . Foot Pain    HPI Natalie Contreras is a 53 y.o. female with a history significant for Type 2 DM, CHF, HTN and PAD, surgical hx of toe amputation secondary to gangrene infection presenting with a right foot ulcer. She reports developing an ulcer on the sole of her foot about a month ago which has slowly progressed. About the same time she was having soreness at her lateral midfoot and she  developed another  ulcer on the lateral aspect of her foot which has darkened and expanded quickly, smells bad and is bleeding.  She has seen her pcp and has gone through several antibiotics including amoxil, doxycycline and was started on Bactrim 7 days ago with no improvement.  Her pain is localized, throbbing.  She denies fevers, chills or radiation of pain.  She has been using soap and water wash followed by telfa dressing.     HPI  Past Medical History:  Diagnosis Date  . Acute diastolic CHF (congestive heart failure) ([place]) 10/28/2016  . Carotid stenosis, asymptomatic, bilateral 01/15/2017  . Diabetes mellitus without complication ([place])   . Heart murmur   . Hypertension   . PAD (peripheral artery disease) ([place]) 04/10/2017   Left ABI 0.74. Right ABI 0.96.    Patient Active Problem List   Diagnosis Date Noted  . Wound infection 06/13/2019  . Cellulitis 07/17/2017  . Pyogenic inflammation of bone ([place])   . Toe amputation status, left 05/05/2017  . Gangrene of left foot ([place])   . PAD (peripheral artery disease) ([place]) 04/10/2017  . Essential hypertension 04/10/2017  . Chronic diastolic CHF (congestive heart failure) ([place]) 04/10/2017  . AKI (acute kidney injury) ([place]) 04/10/2017  . Foreign body in left foot   . Cellulitis and abscess of foot 04/07/2017  . Carotid stenosis, asymptomatic, bilateral 01/15/2017  . Visual  disturbance 01/15/2017  . Tobacco abuse 01/15/2017  . Acute respiratory failure with hypoxia ([place]) 10/30/2016  . Pulmonary hypertension ([place]) 10/29/2016  . Acute diastolic CHF (congestive heart failure) ([place]) 10/28/2016  . Benign essential HTN 10/28/2016  . Hyperlipidemia 10/28/2016  . Diabetes mellitus with neuropathy ([place]) 10/28/2016    Past Surgical History:  Procedure Laterality Date  . AMPUTATION TOE Left 05/05/2017   Procedure: LEFT SECOND TOE AMPUTATION;  Surgeon: Aviva Signs, MD;  Location: [facility] ORS;  Service: General;  Laterality: Left;  . CATARACT EXTRACTION W/PHACO Left 02/08/2016   Procedure: CATARACT EXTRACTION PHACO AND INTRAOCULAR LENS PLACEMENT (IOC);  Surgeon: Tonny Branch, MD;  Location: [facility] ORS;  Service: Ophthalmology;  Laterality: Left;  CDE 11.43   . CATARACT EXTRACTION W/PHACO Right 02/26/2016   Procedure: CATARACT EXTRACTION PHACO AND INTRAOCULAR LENS PLACEMENT RIGHT; CDE:  16.90;  Surgeon: Tonny Branch, MD;  Location: [facility] ORS;  Service: Ophthalmology;  Laterality: Right;  . CHOLECYSTECTOMY    . FOREIGN BODY REMOVAL Left 04/09/2017   Procedure: FOREIGN BODY REMOVAL ADULT FOOT;  Surgeon: Aviva Signs, MD;  Location: [facility]S;  Service: General;  Laterality: Left;  . TRANSMETATARSAL AMPUTATION Left 07/21/2017   Procedure: TRANSMETATARSAL AMPUTATION LEFT FOOT;  Surgeon: Aviva Signs, MD;  Location: [facility];  Service: General;  Laterality: Left;     OB History    Gravida      Para      Term  Preterm      AB      Living  2     SAB      TAB      Ectopic      Multiple      Live Births               Home Medications    Prior to Admission medications   Medication Sig Start Date End Date Taking? Authorizing Provider  acetaminophen (TYLENOL) 325 MG tablet Take 2 tablets (650 mg total) by mouth every 6 (six) hours as needed for mild pain, fever or headache (or Fever >/= 101). 06/15/19   Roxan Hockey, MD  aspirin EC 81 MG tablet Take 1 tablet (81 mg  total) by mouth daily with breakfast. 06/15/19   Denton Brick, Courage, MD  carvedilol (COREG) 3.125 MG tablet Take 1 tablet (3.125 mg total) by mouth 2 (two) times daily with a meal. 06/15/19   Emokpae, Courage, MD  empagliflozin (JARDIANCE) 25 MG TABS tablet Take 25 mg by mouth daily.    [provider]  gabapentin (NEURONTIN) 300 MG capsule Take 1 capsule (300 mg total) 3 (three) times daily by mouth. 07/23/17 07/23/18  Aviva Signs, MD  glimepiride (AMARYL) 2 MG tablet Take 2 mg by mouth daily with breakfast.  03/15/17   [provider]  insulin detemir (LEVEMIR) 100 UNIT/ML injection Inject 35 Units into the skin at bedtime.    [provider]  metFORMIN (GLUCOPHAGE) 500 MG tablet Take 1,000 mg by mouth daily.     [provider]  oxyCODONE (OXY IR/ROXICODONE) 5 MG immediate release tablet Take 5-10 mg by mouth every 4 (four) hours as needed. 08/02/19   [provider]  simvastatin (ZOCOR) 40 MG tablet Take 40 mg by mouth daily.    [provider]  sulfamethoxazole-trimethoprim (BACTRIM DS) 800-160 MG tablet Take 1 tablet by mouth 2 (two) times daily. 08/02/19   [provider]  torsemide (DEMADEX) 20 MG tablet Take 1 tablet (20 mg total) by mouth daily. 06/15/19   Roxan Hockey, MD    Family History Family History  Problem Relation Age of Onset  . Lung cancer Mother   . Lung cancer Father   . AAA (abdominal aortic aneurysm) Brother     Social History Social History   Tobacco Use  . Smoking status: Current Every Day Smoker    Packs/day: 0.75    Years: 25.00    Pack years: 18.75    Types: Cigarettes    Start date: 09/16/1984  . Smokeless tobacco: Never Used  Substance Use Topics  . Alcohol use: No  . Drug use: No     Allergies   Patient has no known allergies.   Review of Systems Review of Systems  Constitutional: Negative for chills and fever.  HENT: Negative for congestion and sore throat.   Eyes: Negative.    Respiratory: Negative for chest tightness and shortness of breath.   Cardiovascular: Negative for chest pain.  Gastrointestinal: Negative for abdominal pain and nausea.  Genitourinary: Negative.   Musculoskeletal: Positive for arthralgias. Negative for joint swelling and neck pain.  Skin: Positive for color change and wound. Negative for rash.  Neurological: Negative for dizziness, weakness, light-headedness, numbness and headaches.  Psychiatric/Behavioral: Negative.      Physical Exam Updated Vital Signs BP (!) 181/49 (BP Location: Left Arm)   Pulse 80   Temp 98.3 F (36.8 C) (Oral)   Resp 18   Ht 5\' 4"  (  1.626 m)   Wt 96.2 kg   LMP 12/16/2015   SpO2 100%   BMI 36.39 kg/m   Physical Exam Constitutional:      Appearance: She is well-developed.  HENT:     Head: Atraumatic.  Neck:     Musculoskeletal: Normal range of motion.  Cardiovascular:     Pulses:          Dorsalis pedis pulses are detected w/ Doppler on the right side.     Comments: Dorsalis pedal pulse found with doppler.  Musculoskeletal:        General: Tenderness present.  Feet:     Right foot:     Skin integrity: Ulcer, skin breakdown, erythema and warmth present.     Comments: Ulcer plantar foot with deep but dry base with additional ulceration right lateral foot with large area of necrosis. No spontaneous drainage.  Surrounding erythema, moderate edema of the foot. Skin:    General: Skin is warm and dry.  Neurological:     Mental Status: She is alert.     Sensory: No sensory deficit.     Deep Tendon Reflexes: Reflexes normal.          ED Treatments / Results  Labs (all labs ordered are listed, but only abnormal results are displayed) Labs Reviewed  CBC WITH DIFFERENTIAL/PLATELET - Abnormal; Notable for the following components:      Result Value   WBC 11.5 (*)    Hemoglobin 10.6 (*)    HCT 34.5 (*)    RDW 15.9 (*)    Platelets 508 (*)    Neutro Abs 9.2 (*)    All other components within  normal limits  BASIC METABOLIC PANEL - Abnormal; Notable for the following components:   Potassium 3.1 (*)    Chloride 95 (*)    Glucose, Bld 180 (*)    BUN 37 (*)    Creatinine, Ser 1.73 (*)    Calcium 8.6 (*)    GFR calc non Af Amer 34 (*)    GFR calc Af Amer 39 (*)    All other components within normal limits    EKG None  Radiology Dg Foot Complete Right  Result Date: 08/08/2019 CLINICAL DATA:  Wounds on right foot for the past month. EXAM: RIGHT FOOT COMPLETE - 3+ VIEW COMPARISON:  07/21/2019 FINDINGS: Diffuse soft tissue swelling. Soft tissue defect about the plantar surface of the midfoot on the lateral view. Soft tissue defect about the lateral mid foot on the AP and oblique views may represent the same area as on the lateral. Small Achilles and calcaneal spurs.  Vascular calcifications. No osseous destruction.  Midfoot degenerative changes. IMPRESSION: Soft tissue swelling with at least 1 soft tissue ulcer about the plantar midfoot as detailed above. No plain film evidence of osteomyelitis. Electronically Signed   By: Abigail Miyamoto M.D.   On: 08/08/2019 09:22    Procedures Procedures (including critical care time)  Medications Ordered in ED Medications  vancomycin (VANCOCIN) IVPB 1000 mg/200 mL premix (1,000 mg Intravenous New Bag/Given 08/08/19 0936)  sodium chloride 0.9 % bolus 1,000 mL (has no administration in time range)  potassium chloride SA (KLOR-CON) CR tablet 20 mEq (has no administration in time range)     Initial Impression / Assessment and Plan / ED Course  I have reviewed the triage vital signs and the nursing notes.  Pertinent labs & imaging results that were available during my care of the patient were reviewed by me and  considered in my medical decision making (see chart for details).        Pt with diabetic foot infection who is not responding to outpatient tx.  Plain imaging negative for obvious osteomyelitis. She has some renal insufficiency in  association with mild dehydration with elevation in BUN. CBG 180, no anion gap. Hypokalemia, oral replacement started.  Pt will need further IV abx, possible MRI to better assess extent of infection.    Discussed with Dr. Denton Brick who will plan admission, transfer to [facility] as she will need vascular evaluation as well as ortho.  Requested adding a dose of cefepime which was ordered.. Pt advised of transfer and tx plan.   Final Clinical Impressions(s) / ED Diagnoses   Final diagnoses:  Diabetic infection of right foot [facility])  Renal insufficiency  Hypokalemia    ED Discharge Orders    None       Landis Martins 08/08/19 1037    Dorie Rank, MD 08/08/19 1513

## 2019-08-08 NOTE — ED Notes (Signed)
Resting  Continues to await transport

## 2019-08-08 NOTE — ED Notes (Signed)
Awaiting transport.

## 2019-08-08 NOTE — Plan of Care (Signed)
  Problem: Pain Managment: Goal: General experience of comfort will improve 08/08/2019 1932 by Camillia Herter, RN Outcome: Progressing 08/08/2019 1930 by Camillia Herter, RN Outcome: Progressing

## 2019-08-08 NOTE — ED Notes (Signed)
Report to Erika, RN

## 2019-08-08 NOTE — ED Notes (Signed)
Meal provided  Pt ate a bit of mac and cheese  Left cabbage and chicken

## 2019-08-08 NOTE — H&P (Addendum)
Patient Demographics:    Natalie Contreras, is a 53 y.o. female  MRN: [id number]   DOB - 1966-09-02  Admit Date - 08/08/2019  Outpatient Primary MD for the patient is Celene Squibb, MD   Assessment & Plan:    Principal Problem:   Abscess or cellulitis of foot Active Problems:   PAD (peripheral artery disease) ([place])   AKI (acute kidney injury) ([place])   Benign essential HTN   Diabetes mellitus with neuropathy ([place])   Tobacco abuse   Chronic diastolic CHF (congestive heart failure) (HCC)   Obesity (BMI 30-39.9)   1)Rt Foot wound infection/cellulitis/possible gangrene----failed outpatient antibiotic treatment, please see photos in epic  -Previously treated with Vanco and Zosyn in the ED, as well as IV Rocephin, and also recently treated with doxycycline and Keflex , PCP apparently also treated patient recently with Bactrim -sole of Rt foot wound with final wound cultures from 06/13/2019 with pansensitive MSSA and ABUNDANT [place] -Patient now has NEW right fibular side foot wound that looks necrotic/gangrenous--empirically treat with Vanco and cefepime -Patient needs MRI of the right foot -Patient will need vascular surgery consult -Possible consult from Dr. Sharol Given (ortho) after MRI results are available -Patient is concerned because she had a nonhealing wound on the left foot previously requiring amputations of four toes of the left foot she only has a left big toe WBC=11.5 -ESR=122 CRP-11.7  2)PAD--continue simvastatin 40 mg daily and aspirin, smoking cessation strongly advised  -Patient was previously evaluated by Dr. Donnetta Hutching of Vascular surgery .   -Prior arterial Dopplers with ABI from 01/15/2016 noted with segmental tibial disease -Pt will need Repeat arterial ultrasound with ABI, patient will possibly  need CTA abdomen and pelvis with runoff to evaluate  lower extremity peripheral artery disease  further  -  3)AKI----acute kidney injury on CKD stage III -worsening renal function apparently due to dehydration compounded by diuretic and antibiotic/Bactrim use    creatinine on admission= 1.73 ,   baseline creatinine = 1.24 (06/15/19) , renally adjust medications, avoid nephrotoxic agents/dehydration/hypotension -Hold Metformin -Hold Torsemide  4)DM2-recent A1c 10.7, reflecting uncontrolled DM  -Hold Metformin and Jardiance as patient will probably be having contrast studies - continue Levemir 35 units qhs, as well as Amaryl with breakfast  -Use Novolog/Humalog Sliding scale insulin with Accu-Cheks/Fingersticks as ordered  5)HTN--BP stable at this time, continue Coreg 3.125 mg twice daily,   may use IV labetalol when necessary  Every 4 hours for systolic blood pressure over 160 mmhg  6)HFpEF--patient with history of chronic diastolic dysfunction CHF  (d2CHF), appears euvolemic, last known EF 55 to 60%, -continue Coreg as above,   hold torsemide 20 mg daily due to kidney concerns  7)Tobacco abuse--  Smoking cessation counseling for 4 minutes today, give nicotine patch I have discussed tobacco cessation with the patient.  I have counseled the patient regarding the negative impacts of continued tobacco use including but not limited to lung cancer,  COPD, and cardiovascular disease.  I have discussed alternatives to tobacco and modalities that may help facilitate tobacco cessation including but not limited to biofeedback, hypnosis, and medications.  Total time spent with tobacco counseling was 4 minutes.  With History of - Reviewed by me  Past Medical History:  Diagnosis Date   Acute diastolic CHF (congestive heart failure) ([place]) 10/28/2016   Carotid stenosis, asymptomatic, bilateral 01/15/2017   Diabetes mellitus without complication (HCC)    Heart murmur    Hypertension    PAD  (peripheral artery disease) ([place]) 04/10/2017   Left ABI 0.74. Right ABI 0.96.      Past Surgical History:  Procedure Laterality Date   AMPUTATION TOE Left 05/05/2017   Procedure: LEFT SECOND TOE AMPUTATION;  Surgeon: Aviva Signs, MD;  Location: [facility];  Service: General;  Laterality: Left;   CATARACT EXTRACTION W/PHACO Left 02/08/2016   Procedure: CATARACT EXTRACTION PHACO AND INTRAOCULAR LENS PLACEMENT ([place]);  Surgeon: Tonny Branch, MD;  Location: [facility];  Service: Ophthalmology;  Laterality: Left;  CDE 11.43    CATARACT EXTRACTION W/PHACO Right 02/26/2016   Procedure: CATARACT EXTRACTION PHACO AND INTRAOCULAR LENS PLACEMENT RIGHT; CDE:  16.90;  Surgeon: Tonny Branch, MD;  Location: [facility];  Service: Ophthalmology;  Laterality: Right;   CHOLECYSTECTOMY     FOREIGN BODY REMOVAL Left 04/09/2017   Procedure: FOREIGN BODY REMOVAL ADULT FOOT;  Surgeon: Aviva Signs, MD;  Location: [facility];  Service: General;  Laterality: Left;   TRANSMETATARSAL AMPUTATION Left 07/21/2017   Procedure: TRANSMETATARSAL AMPUTATION LEFT FOOT;  Surgeon: Aviva Signs, MD;  Location: [facility];  Service: General;  Laterality: Left;     Chief Complaint  Patient presents with   Foot Pain      HPI:    Natalie Contreras  is a 53 y.o. female   With PMhx of d2CHF/HFpEF,  DM2, HTN, PAD with recurrent lower extremity foot infections requiring amputations, and ongoing tobacco abuse who presents to the ED with worsening right foot wounds despite repeated doses of antibiotics for the last several weeks  -Patient the wound on his plantar surface previously that grew presents MSSA and ABUNDANT [place] on 06/13/2019 -Over the last several days she has developed a NEW necrotic-looking wound over the fibular side of a right foot--tried outpatient antibiotics with no success -Patient has neuropathy so sensation of both lower extremities is poor -Denies fevers or chills -No nausea or vomiting, patient complains of  constipation  --Patient is concerned because she had a nonhealing wound on the left foot previously requiring amputations of four toes of the left foot she only has a left big toe  In ED--WBC is 11.5, creatinine is 1.73 -Right foot x-rays without osteomyelitis -Patient will need MRI of the right foot as well as arterial Dopplers with ABI -Patient will also need vascular surgery consult -Possible consult from Dr. Sharol Given (ortho) after MRI right foot results are available  -No chest pains, no palpitations no dizziness no shortness of breath    Review of systems:    In addition to the HPI above,   A full Review of  Systems was done, all other systems reviewed are negative except as noted above in HPI , .    Social History:  Reviewed by me    Social History   Tobacco Use   Smoking status: Current Every Day Smoker    Packs/day: 0.75    Years: 25.00    Pack years: 18.75    Types: Cigarettes  Start date: 09/16/1984   Smokeless tobacco: Never Used  Substance Use Topics   Alcohol use: No     Family History :  Reviewed by me    Family History  Problem Relation Age of Onset   Lung cancer Mother    Lung cancer Father    AAA (abdominal aortic aneurysm) Brother     Home Medications:   Prior to Admission medications   Medication Sig Start Date End Date Taking? Authorizing Provider  acetaminophen (TYLENOL) 325 MG tablet Take 2 tablets (650 mg total) by mouth every 6 (six) hours as needed for mild pain, fever or headache (or Fever >/= 101). 06/15/19   Roxan Hockey, MD  aspirin EC 81 MG tablet Take 1 tablet (81 mg total) by mouth daily with breakfast. 06/15/19   Denton Brick, Obera Stauch, MD  carvedilol (COREG) 3.125 MG tablet Take 1 tablet (3.125 mg total) by mouth 2 (two) times daily with a meal. 06/15/19   Bri Wakeman, MD  empagliflozin (JARDIANCE) 25 MG TABS tablet Take 25 mg by mouth daily.    [provider]  gabapentin (NEURONTIN) 300 MG capsule Take 1 capsule  (300 mg total) 3 (three) times daily by mouth. 07/23/17 07/23/18  Aviva Signs, MD  glimepiride (AMARYL) 2 MG tablet Take 2 mg by mouth daily with breakfast.  03/15/17   [provider]  insulin detemir (LEVEMIR) 100 UNIT/ML injection Inject 35 Units into the skin at bedtime.    [provider]  metFORMIN (GLUCOPHAGE) 500 MG tablet Take 1,000 mg by mouth daily.     [provider]  oxyCODONE (OXY IR/ROXICODONE) 5 MG immediate release tablet Take 5-10 mg by mouth every 4 (four) hours as needed. 08/02/19   [provider]  simvastatin (ZOCOR) 40 MG tablet Take 40 mg by mouth daily.    [provider]  sulfamethoxazole-trimethoprim (BACTRIM DS) 800-160 MG tablet Take 1 tablet by mouth 2 (two) times daily. 08/02/19   [provider]  torsemide (DEMADEX) 20 MG tablet Take 1 tablet (20 mg total) by mouth daily. 06/15/19   Roxan Hockey, MD     Allergies:    No Known Allergies   Physical Exam:   Vitals  Blood pressure (!) 137/55, pulse 81, temperature 98.3 F (36.8 C), temperature source Oral, resp. rate 16, height _0  (1.626 m), weight 96.2 kg, last menstrual period 12/16/2015, SpO2 92 %.  Physical Examination: General appearance - alert, obese appearing, and in no distress Mental status - alert, oriented to person, place, and time,  Mouth--poor oral hygiene, dental caries Eyes - sclera anicteric Neck - supple, no JVD elevation , Chest - clear  to auscultation bilaterally, symmetrical air movement,  Heart - S1 and S2 normal, regular , 3/6 SM Abdomen - soft, nontender, nondistended, no masses or organomegaly Neurological - screening mental status exam normal, neck supple without rigidity, cranial nerves II through XII intact, DTR's normal and symmetric Extremities - intact peripheral pulses  Skin - warm, dry Feet---  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 patient only has Lt left big toe Rt Foot--- callus on plantar surfaces,   -Fibular side of right foot with NEW necrotic eschar/gangrenous, smelly  wound- , patient also has a deep wound on the plantar surface right in the middle of the bottom aspect of the foot --without significant drainage at this time please see photos in epic  - Media Information    Document Information  Photos    08/08/2019 09:43  Attached  To:  Hospital Encounter on 08/08/19  Source Information  Idol, Ginette Otto   [facility]    - Media Information    Document Information  Photos    08/08/2019 09:44  Attached To:  Hospital Encounter on 08/08/19  Source Information  Landis Martins   [facility]      Data Review:    CBC Recent Labs  Lab 08/08/19 0839  WBC 11.5*  HGB 10.6*  HCT 34.5*  PLT 508*  MCV 87.6  MCH 26.9  MCHC 30.7  RDW 15.9*  LYMPHSABS 1.5  MONOABS 0.5  EOSABS 0.1  BASOSABS 0.1   ------------------------------------------------------------------------------------------------------------------  Chemistries  Recent Labs  Lab 08/08/19 0839  NA 136  K 3.1*  CL 95*  CO2 27  GLUCOSE 180*  BUN 37*  CREATININE 1.73*  CALCIUM 8.6*   ------------------------------------------------------------------------------------------------------------------ estimated creatinine clearance is 43.3 mL/min (A) (by C-G formula based on SCr of 1.73 mg/dL (H)). ------------------------------------------------------------------------------------------------------------------ No results for input(s): TSH, T4TOTAL, T3FREE, THYROIDAB in the last 72 hours.  Invalid input(s): FREET3   Coagulation profile No results for input(s): INR, PROTIME in the last 168 hours. ------------------------------------------------------------------------------------------------------------------- No results for input(s): DDIMER in the last 72  hours. -------------------------------------------------------------------------------------------------------------------  Cardiac Enzymes No results for input(s): CKMB, TROPONINI, MYOGLOBIN in the last 168 hours.  Invalid input(s): CK ------------------------------------------------------------------------------------------------------------------    Component Value Date/Time   BNP 220.0 (H) 10/28/2016 0739     ---------------------------------------------------------------------------------------------------------------  Urinalysis    Component Value Date/Time   COLORURINE STRAW (A) 04/07/2017 1027   APPEARANCEUR CLEAR 04/07/2017 1027   LABSPEC 1.006 04/07/2017 1027   PHURINE 5.0 04/07/2017 1027   GLUCOSEU NEGATIVE 04/07/2017 1027   HGBUR NEGATIVE 04/07/2017 1027   BILIRUBINUR NEGATIVE 04/07/2017 1027   KETONESUR NEGATIVE 04/07/2017 1027   PROTEINUR NEGATIVE 04/07/2017 1027   NITRITE NEGATIVE 04/07/2017 1027   LEUKOCYTESUR NEGATIVE 04/07/2017 1027    ----------------------------------------------------------------------------------------------------------------   Imaging Results:    Dg Foot Complete Right  Result Date: 08/08/2019 CLINICAL DATA:  Wounds on right foot for the past month. EXAM: RIGHT FOOT COMPLETE - 3+ VIEW COMPARISON:  07/21/2019 FINDINGS: Diffuse soft tissue swelling. Soft tissue defect about the plantar surface of the midfoot on the lateral view. Soft tissue defect about the lateral mid foot on the AP and oblique views may represent the same area as on the lateral. Small Achilles and calcaneal spurs.  Vascular calcifications. No osseous destruction.  Midfoot degenerative changes. IMPRESSION: Soft tissue swelling with at least 1 soft tissue ulcer about the plantar midfoot as detailed above. No plain film evidence of osteomyelitis. Electronically Signed   By: Abigail Miyamoto M.D.   On: 08/08/2019 09:22    Radiological Exams on Admission: Dg Foot Complete  Right  Result Date: 08/08/2019 CLINICAL DATA:  Wounds on right foot for the past month. EXAM: RIGHT FOOT COMPLETE - 3+ VIEW COMPARISON:  07/21/2019 FINDINGS: Diffuse soft tissue swelling. Soft tissue defect about the plantar surface of the midfoot on the lateral view. Soft tissue defect about the lateral mid foot on the AP and oblique views may represent the same area as on the lateral. Small Achilles and calcaneal spurs.  Vascular calcifications. No osseous destruction.  Midfoot degenerative changes. IMPRESSION: Soft tissue swelling with at least 1 soft tissue ulcer about the plantar midfoot as detailed above. No plain film evidence of osteomyelitis. Electronically Signed   By: Abigail Miyamoto M.D.   On: 08/08/2019 09:22    DVT Prophylaxis -SCD/heparin AM Labs Ordered, also please  review Full Orders  Family Communication: Admission, patients condition and plan of care including tests being ordered have been discussed with the patient who indicate understanding and agree with the plan   Code Status - Full Code  Likely DC to  Transfer to [facility]  Condition   Stable  Roxan Hockey M.D on 08/08/2019 at 1:44 PM Go to [email or web address] -  for contact info  [facility] - Office  [phone]

## 2019-08-08 NOTE — ED Notes (Signed)
purwick applied.

## 2019-08-08 NOTE — Progress Notes (Signed)
Pharmacy Antibiotic Note  Natalie Contreras is a 53 y.o. female admitted on 08/08/2019 with right foot wound infection/cellulitis/possible gangrene.  Pharmacy has been consulted for Vancomycin and Cefepime dosing.    Plan: Vancomycin 2000 mg IV x 1 dose. Vancomycin 1250 mg IV every 24 hours.  Goal trough 15-20 mcg/mL.  Cefepime 2000 mg IV every 12 hours. Monitor labs, c/s, and vanco level as indicated.  Height: 5\' 4"  (162.6 cm) Weight: 212 lb (96.2 kg) IBW/kg (Calculated) : 54.7  Temp (24hrs), Avg:98.3 F (36.8 C), Min:98.3 F (36.8 C), Max:98.3 F (36.8 C)  Recent Labs  Lab 08/08/19 0839  WBC 11.5*  CREATININE 1.73*    Estimated Creatinine Clearance: 43.3 mL/min (A) (by C-G formula based on SCr of 1.73 mg/dL (H)).    No Known Allergies  Antimicrobials this admission: Vanco 11/22 >>  Cefepime 11/22 >>   Dose adjustments this admission: Vanco/Cefepime  Microbiology results: None pending  Thank you for allowing pharmacy to be a part of this patient's care.  Ramond Craver 08/08/2019 1:50 PM

## 2019-08-08 NOTE — ED Notes (Signed)
Awaiting bed assignment.

## 2019-08-08 NOTE — Plan of Care (Signed)
  Problem: Pain Managment: Goal: General experience of comfort will improve Outcome: Progressing   

## 2019-08-09 ENCOUNTER — Encounter (HOSPITAL_COMMUNITY): Payer: Self-pay | Admitting: General Practice

## 2019-08-09 ENCOUNTER — Inpatient Hospital Stay (HOSPITAL_COMMUNITY): Payer: Medicaid Other

## 2019-08-09 DIAGNOSIS — E43 Unspecified severe protein-calorie malnutrition: Secondary | ICD-10-CM

## 2019-08-09 DIAGNOSIS — I739 Peripheral vascular disease, unspecified: Secondary | ICD-10-CM

## 2019-08-09 DIAGNOSIS — I6523 Occlusion and stenosis of bilateral carotid arteries: Secondary | ICD-10-CM

## 2019-08-09 DIAGNOSIS — E11628 Type 2 diabetes mellitus with other skin complications: Secondary | ICD-10-CM

## 2019-08-09 DIAGNOSIS — Z720 Tobacco use: Secondary | ICD-10-CM

## 2019-08-09 DIAGNOSIS — L089 Local infection of the skin and subcutaneous tissue, unspecified: Secondary | ICD-10-CM

## 2019-08-09 DIAGNOSIS — I5031 Acute diastolic (congestive) heart failure: Secondary | ICD-10-CM

## 2019-08-09 DIAGNOSIS — E1140 Type 2 diabetes mellitus with diabetic neuropathy, unspecified: Secondary | ICD-10-CM

## 2019-08-09 DIAGNOSIS — M86271 Subacute osteomyelitis, right ankle and foot: Secondary | ICD-10-CM

## 2019-08-09 DIAGNOSIS — Z01810 Encounter for preprocedural cardiovascular examination: Secondary | ICD-10-CM

## 2019-08-09 DIAGNOSIS — L03119 Cellulitis of unspecified part of limb: Secondary | ICD-10-CM

## 2019-08-09 DIAGNOSIS — I96 Gangrene, not elsewhere classified: Secondary | ICD-10-CM

## 2019-08-09 DIAGNOSIS — L02619 Cutaneous abscess of unspecified foot: Secondary | ICD-10-CM

## 2019-08-09 HISTORY — DX: Cutaneous abscess of unspecified foot: L02.619

## 2019-08-09 LAB — GLUCOSE, CAPILLARY
Glucose-Capillary: 131 mg/dL — ABNORMAL HIGH (ref 70–99)
Glucose-Capillary: 99 mg/dL (ref 70–99)
Glucose-Capillary: 147 mg/dL — ABNORMAL HIGH (ref 70–99)
Glucose-Capillary: 164 mg/dL — ABNORMAL HIGH (ref 70–99)

## 2019-08-09 LAB — CBC
HCT: 33.3 % — ABNORMAL LOW (ref 36.0–46.0)
Hemoglobin: 10.2 g/dL — ABNORMAL LOW (ref 12.0–15.0)
MCH: 26.6 pg (ref 26.0–34.0)
MCHC: 30.6 g/dL (ref 30.0–36.0)
MCV: 86.9 fL (ref 80.0–100.0)
Platelets: 453 10*3/uL — ABNORMAL HIGH (ref 150–400)
RBC: 3.83 MIL/uL — ABNORMAL LOW (ref 3.87–5.11)
RDW: 16 % — ABNORMAL HIGH (ref 11.5–15.5)
WBC: 8.3 10*3/uL (ref 4.0–10.5)
nRBC: 0 % (ref 0.0–0.2)

## 2019-08-09 LAB — BASIC METABOLIC PANEL
Anion gap: 11 (ref 5–15)
BUN: 31 mg/dL — ABNORMAL HIGH (ref 6–20)
CO2: 25 mmol/L (ref 22–32)
Calcium: 8.5 mg/dL — ABNORMAL LOW (ref 8.9–10.3)
Chloride: 104 mmol/L (ref 98–111)
Creatinine, Ser: 1.43 mg/dL — ABNORMAL HIGH (ref 0.44–1.00)
GFR calc Af Amer: 49 mL/min — ABNORMAL LOW (ref >60)
GFR calc non Af Amer: 42 mL/min — ABNORMAL LOW (ref >60)
Glucose, Bld: 131 mg/dL — ABNORMAL HIGH (ref 70–99)
Potassium: 3.5 mmol/L (ref 3.5–5.1)
Sodium: 140 mmol/L (ref 135–145)

## 2019-08-09 LAB — SARS CORONAVIRUS 2 (TAT 6-24 HRS): SARS Coronavirus 2: NEGATIVE

## 2019-08-09 IMAGING — MR MR FOOT*R* WO/W CM
4 of 9 series · 19 of 40 positions shown · IV contrast (gadavist)
Comparison: None.

CLINICAL DATA: Diabetes and foot swelling, possible osteomyelitis.
Neuropathy.

EXAM:
MRI OF THE RIGHT FOREFOOT WITHOUT AND WITH CONTRAST
TECHNIQUE: Multiplanar, multisequence MR imaging of the right forefoot was
performed before and after the administration of intravenous
contrast.
CONTRAST:  10mL GADAVIST GADOBUTROL 1 MMOL/ML IV SOLN

[Series 3: T1 · coronal · 3.0mm · 0.27mm/px · 7 of 54 slices shown (1 of 2)]
[im 1/54]
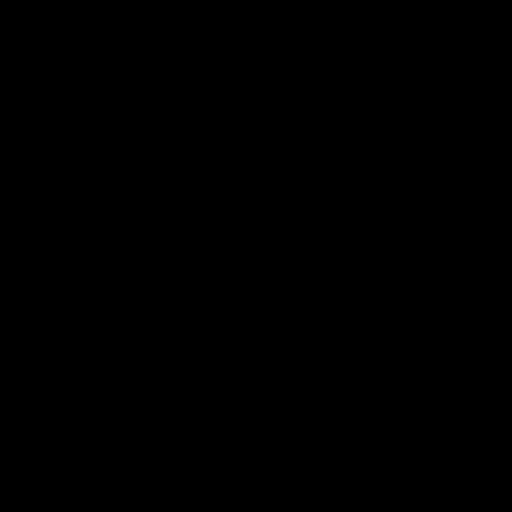
[im 9/54]
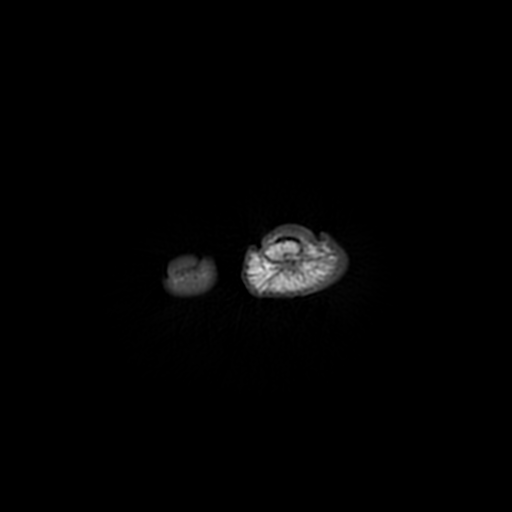
[im 18/54]
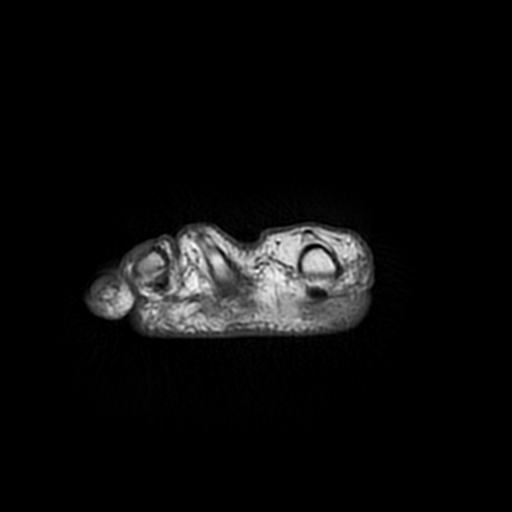
[im 27/54]
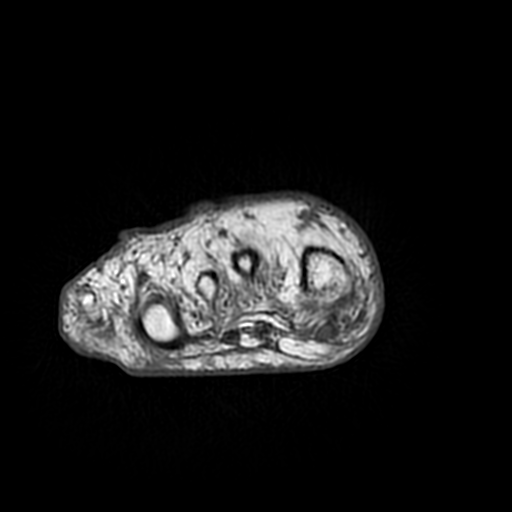
[im 36/54]
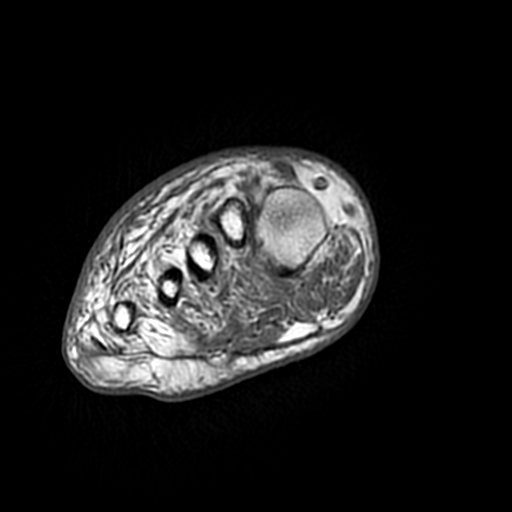
[im 45/54]
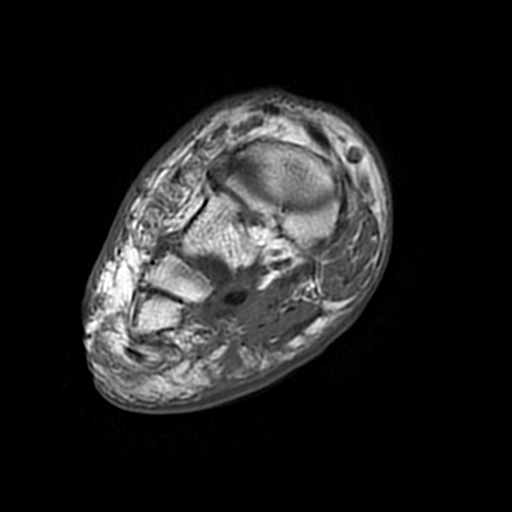
[im 54/54]
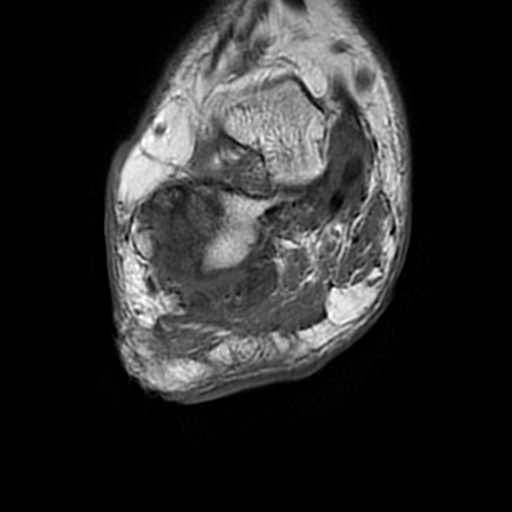

[Series 4: T1 fat-sat · coronal · non-contrast · 3.0mm · 0.27mm/px · 3 of 58 slices shown]
[im 12/58]
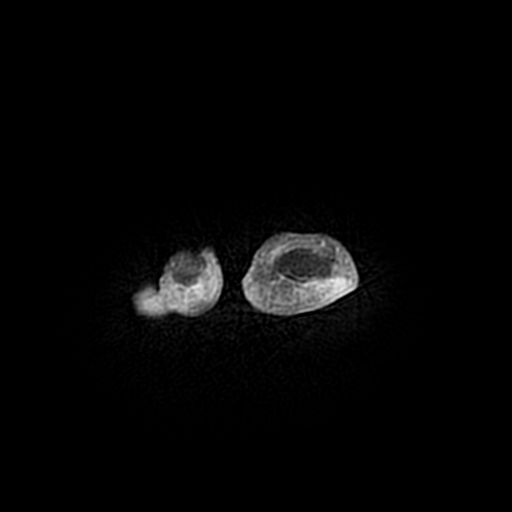
[im 35/58]
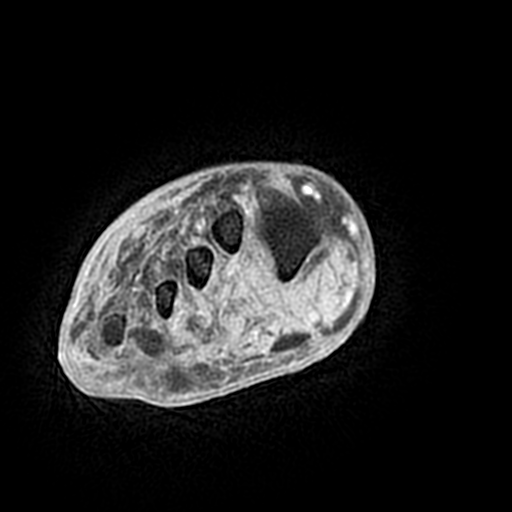
[im 58/58]
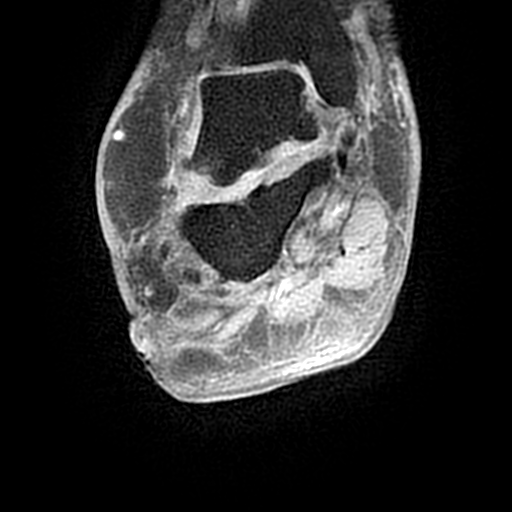

[Series 5: T2 fat-sat · coronal · 3.0mm · 0.27mm/px · 6 of 58 slices shown]
[im 1/58]
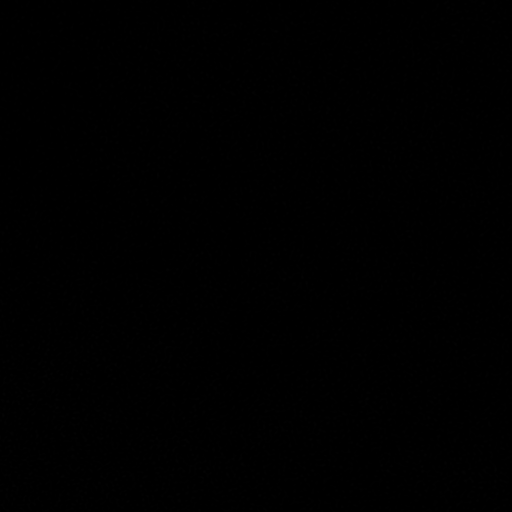
[im 12/58]
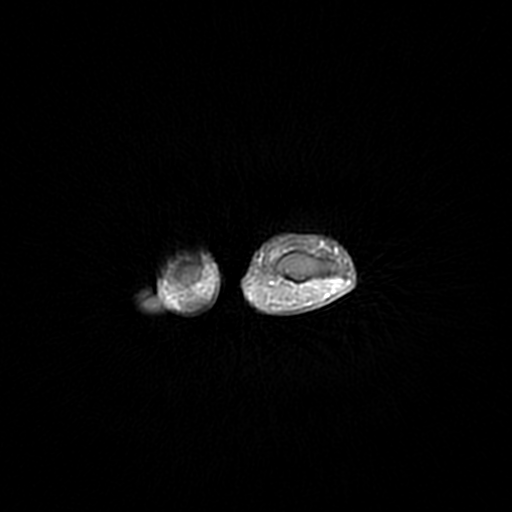
[im 23/58]
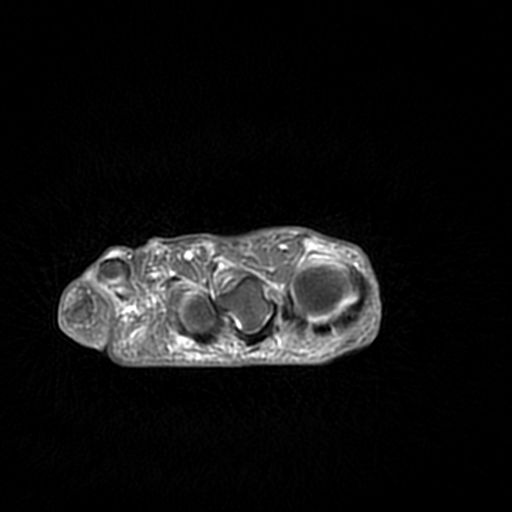
[im 35/58]
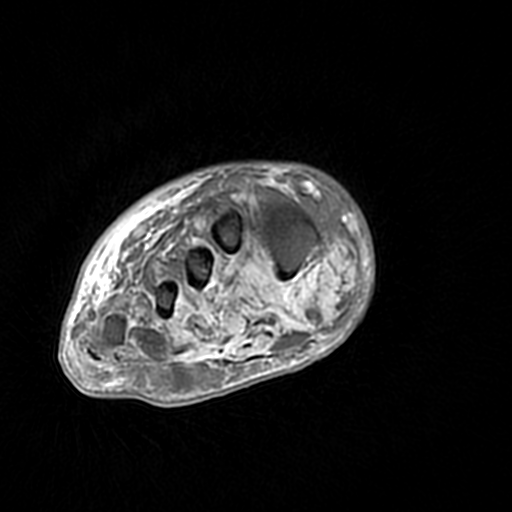
[im 46/58]
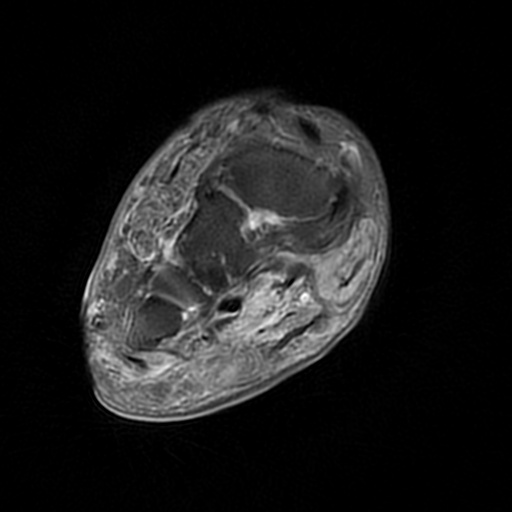
[im 58/58]
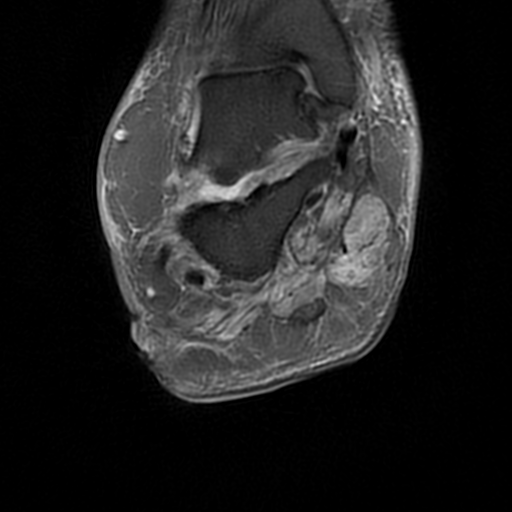

[Series 7: T1 · axial · 3.0mm · 0.39mm/px · z∈[-92,-3]mm · 3 of 28 slices shown (2 of 2)]
[im 1/28]
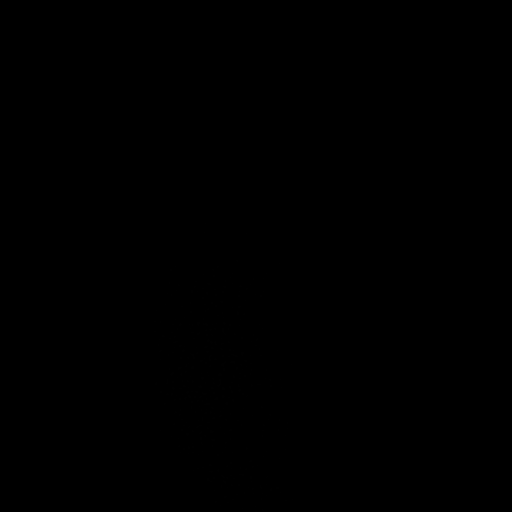
[im 14/28]
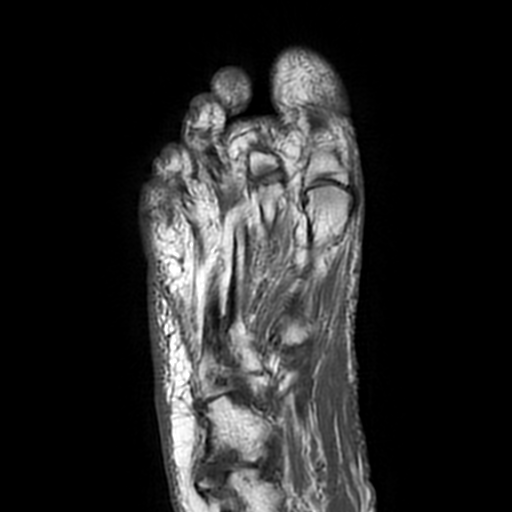
[im 28/28]
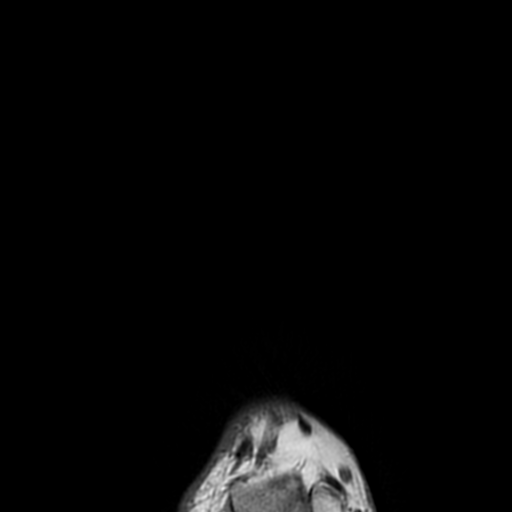

[19 of 40 positions shown; findings below may reference images not displayed]

FINDINGS: Bones/Joint/Cartilage

Degenerative arthropathy at the calcaneocuboid joint with
subcortical marrow edema spurring as well as a small calcaneocuboid
joint effusion.

Ligaments

The Lisfranc ligament appears intact.

Muscles and Tendons

There is atrophy and edema the abductor digiti minimi muscle and
flexor digiti minimi brevis muscle likely related to cutaneous
ulceration along the plantar foot and overlying lateral ulceration
adjacent to base of the fifth metatarsal. No appreciable drainable
abscess. Low-level edema tracks along the remaining plantar
musculature of the foot.

Soft tissues

Cutaneous ulceration along the plantar foot below the base of the
fifth metatarsal, and also lateral to the base of the fifth
metatarsal.
IMPRESSION: 1. Cutaneous ulceration along the plantar foot and lateral to the
base of the fifth metatarsal. No drainable abscess or osteomyelitis.
2. Atrophy and edema in the abductor digiti minimi and flexor digiti
minimi brevis muscle, likely from myositis.
3. Advanced localized degenerative arthropathy at the calcaneocuboid
joint.

## 2019-08-09 MED ORDER — INFLUENZA VAC SPLIT QUAD 0.5 ML IM SUSY
0.5000 mL | PREFILLED_SYRINGE | INTRAMUSCULAR | Status: AC
  Administered 2019-08-14: 0.5 mL via INTRAMUSCULAR
  Filled 2019-08-09: qty 0.5

## 2019-08-09 MED ORDER — PNEUMOCOCCAL VAC POLYVALENT 25 MCG/0.5ML IJ INJ
0.5000 mL | INJECTION | INTRAMUSCULAR | Status: AC
  Administered 2019-08-14: 0.5 mL via INTRAMUSCULAR
  Filled 2019-08-09: qty 0.5

## 2019-08-09 MED ORDER — VANCOMYCIN HCL IN DEXTROSE 1-5 GM/200ML-% IV SOLN
1000.0000 mg | INTRAVENOUS | Status: DC
  Administered 2019-08-09: 1000 mg via INTRAVENOUS
  Filled 2019-08-09 (×2): qty 200

## 2019-08-09 MED ORDER — GADOBUTROL 1 MMOL/ML IV SOLN
10.0000 mL | Freq: Once | INTRAVENOUS | Status: AC | PRN
  Administered 2019-08-09: 10 mL via INTRAVENOUS

## 2019-08-09 NOTE — Progress Notes (Signed)
Lower extremity venous has been completed.   Preliminary results in CV Proc.   Abram Sander 08/09/2019 11:10 AM

## 2019-08-09 NOTE — H&P (View-Only) (Signed)
ORTHOPAEDIC CONSULTATION  REQUESTING PHYSICIAN: Jonnie Finner, DO  Chief Complaint: Gangrenous ulcer lateral aspect right foot.  HPI: Natalie Contreras is a 53 y.o. female who presents with gangrenous ulcer lateral aspect right foot.  Has uncontrolled type 2 diabetes with severe protein caloric malnutrition.  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  This has healed well.  Past Medical History:  Diagnosis Date  . Acute diastolic CHF (congestive heart failure) ([place]) 10/28/2016  . Carotid stenosis, asymptomatic, bilateral 01/15/2017  . Diabetes mellitus without complication ([place])   . Heart murmur   . Hypertension   . PAD (peripheral artery disease) ([place]) 04/10/2017   Left ABI 0.74. Right ABI 0.96.   Past Surgical History:  Procedure Laterality Date  . AMPUTATION TOE Left 05/05/2017   Procedure: LEFT SECOND TOE AMPUTATION;  Surgeon: Aviva Signs, MD;  Location: [facility];  Service: General;  Laterality: Left;  . CATARACT EXTRACTION W/PHACO Left 02/08/2016   Procedure: CATARACT EXTRACTION PHACO AND INTRAOCULAR LENS PLACEMENT (IOC);  Surgeon: Tonny Branch, MD;  Location: [facility];  Service: Ophthalmology;  Laterality: Left;  CDE 11.43   . CATARACT EXTRACTION W/PHACO Right 02/26/2016   Procedure: CATARACT EXTRACTION PHACO AND INTRAOCULAR LENS PLACEMENT RIGHT; CDE:  16.90;  Surgeon: Tonny Branch, MD;  Location: [facility];  Service: Ophthalmology;  Laterality: Right;  . CHOLECYSTECTOMY    . FOREIGN BODY REMOVAL Left 04/09/2017   Procedure: FOREIGN BODY REMOVAL ADULT FOOT;  Surgeon: Aviva Signs, MD;  Location: [facility];  Service: General;  Laterality: Left;  . TRANSMETATARSAL AMPUTATION Left 07/21/2017   Procedure: TRANSMETATARSAL AMPUTATION LEFT FOOT;  Surgeon: Aviva Signs, MD;  Location: [facility];  Service: General;  Laterality: Left;   Social History   Socioeconomic History  . Marital status: Single    Spouse name: Not on file  . Number of children: Not on file   . Years of education: Not on file  . Highest education level: Not on file  Occupational History  . Not on file  Social Needs  . Financial resource strain: Not on file  . Food insecurity    Worry: Not on file    Inability: Not on file  . Transportation needs    Medical: Not on file    Non-medical: Not on file  Tobacco Use  . Smoking status: Current Every Day Smoker    Packs/day: 0.75    Years: 25.00    Pack years: 18.75    Types: Cigarettes    Start date: 09/16/1984  . Smokeless tobacco: Never Used  Substance and Sexual Activity  . Alcohol use: No  . Drug use: No  . Sexual activity: Yes    Birth control/protection: None  Lifestyle  . Physical activity    Days per week: Not on file    Minutes per session: Not on file  . Stress: Not on file  Relationships  . Social Herbalist on phone: Not on file    Gets together: Not on file    Attends religious service: Not on file    Active member of club or organization: Not on file    Attends meetings of clubs or organizations: Not on file    Relationship status: Not on file  Other Topics Concern  . Not on file  Social History Narrative  . Not on file   Family History  Problem Relation Age of Onset  . Lung cancer Mother   .  Lung cancer Father   . AAA (abdominal aortic aneurysm) Brother    - negative except otherwise stated in the family history section No Known Allergies Prior to Admission medications   Medication Sig Start Date End Date Taking? Authorizing Provider  acetaminophen (TYLENOL) 325 MG tablet Take 2 tablets (650 mg total) by mouth every 6 (six) hours as needed for mild pain, fever or headache (or Fever >/= 101). 06/15/19  Yes Emokpae, Courage, MD  aspirin EC 81 MG tablet Take 1 tablet (81 mg total) by mouth daily with breakfast. 06/15/19  Yes Emokpae, Courage, MD  carvedilol (COREG) 3.125 MG tablet Take 1 tablet (3.125 mg total) by mouth 2 (two) times daily with a meal. 06/15/19  Yes Emokpae, Courage, MD   empagliflozin (JARDIANCE) 25 MG TABS tablet Take 25 mg by mouth daily.   Yes [provider]  insulin detemir (LEVEMIR) 100 UNIT/ML injection Inject 35 Units into the skin at bedtime.   Yes [provider]  metFORMIN (GLUCOPHAGE) 500 MG tablet Take 1,000 mg by mouth daily.    Yes [provider]  oxyCODONE (OXY IR/ROXICODONE) 5 MG immediate release tablet Take 5-10 mg by mouth every 4 (four) hours as needed for moderate pain.  08/02/19  Yes [provider]  simvastatin (ZOCOR) 40 MG tablet Take 40 mg by mouth daily.   Yes [provider]  torsemide (DEMADEX) 20 MG tablet Take 1 tablet (20 mg total) by mouth daily. 06/15/19  Yes Roxan Hockey, MD   Dg Foot Complete Right  Result Date: 08/08/2019 CLINICAL DATA:  Wounds on right foot for the past month. EXAM: RIGHT FOOT COMPLETE - 3+ VIEW COMPARISON:  07/21/2019 FINDINGS: Diffuse soft tissue swelling. Soft tissue defect about the plantar surface of the midfoot on the lateral view. Soft tissue defect about the lateral mid foot on the AP and oblique views may represent the same area as on the lateral. Small Achilles and calcaneal spurs.  Vascular calcifications. No osseous destruction.  Midfoot degenerative changes. IMPRESSION: Soft tissue swelling with at least 1 soft tissue ulcer about the plantar midfoot as detailed above. No plain film evidence of osteomyelitis. Electronically Signed   By: Abigail Miyamoto M.D.   On: 08/08/2019 09:22   - pertinent xrays, CT, MRI studies were reviewed and independently interpreted  Positive ROS: All other systems have been reviewed and were otherwise negative with the exception of those mentioned in the HPI and as above.  Physical Exam: General: Alert, no acute distress Psychiatric: Patient is competent for consent with normal mood and affect Lymphatic: No axillary or cervical lymphadenopathy Cardiovascular: No pedal edema Respiratory: No cyanosis, no use of accessory  musculature GI: No organomegaly, abdomen is soft and non-tender    Images:  @ENCIMAGES @  Labs:  Lab Results  Component Value Date   HGBA1C 10.7 (H) 06/13/2019   HGBA1C 7.1 (H) 07/19/2017   HGBA1C 7.0 (H) 05/01/2017   ESRSEDRATE 122 (H) 08/08/2019   CRP 11.7 (H) 08/08/2019   REPTSTATUS 06/20/2019 FINAL 06/13/2019   GRAMSTAIN  06/13/2019    CORRECTED RESULTS ABUNDANT WBC PRESENT, PREDOMINANTLY PMN ABUNDANT GRAM POSITIVE COCCI PREVIOUSLY REPORTED AS: NO ORGANISMS SEEN MODERATE WBC PRESENT, PREDOMINANTLY PMN Performed at [facility]: RN Nash Mantis [phone] [id number] FCP Performed at [facility], [place] [address], [place], [place] [address]    CULT  06/13/2019    FEW STAPHYLOCOCCUS AUREUS ABUNDANT [place] STAPHYLOCOCCUS AUREUS 06/13/2019  Lab Results  Component Value Date   ALBUMIN 2.7 (L) 07/17/2017   ALBUMIN 2.2 (L) 04/15/2017   ALBUMIN 2.3 (L) 04/14/2017    Neurologic: Patient does not have protective sensation bilateral lower extremities.   MUSCULOSKELETAL:   Skin: Examination patient has a black gangrenous ulcer over the lateral aspect of the right foot.  She does have cellulitis extending up into the ankle.  Patient does not have a palpable dorsalis pedis or posterior tibial pulse.  Review of the radiographs shows air in the soft tissue of the ulcer over the lateral column of the right foot at the base of the fifth metatarsal as well as over the cuboid.  Radiographs do show destructive bony changes involving the base of the fifth metatarsal the cuboid and the calcaneus.  Patient's recent hemoglobin A1c is 10.7 her albumin is ranged from 2.2-2.7.  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  Plan:  Plan: I agree with vascular work-up to determine if there are revascularization options.  I anticipate the MRI scan will confirm the osteomyelitis of the fifth metatarsal cuboid and calcaneus.  With the extensive bony destruction of the base of the lateral column right foot, foot salvage intervention options will be difficult.  Thank you for the consult and the opportunity to see Natalie Contreras, [place] [phone] 7:50 AM

## 2019-08-09 NOTE — Progress Notes (Signed)
Pharmacy Antibiotic Note  Natalie Contreras is a 53 y.o. female admitted on 08/08/2019 with R foot wound infection/cellulitis/possible gangrene.  Pharmacy has been consulted for vancomycin and cefepime dosing.  Vancomycin 1250 mg IV q24h was started on 11/22; however, the estimated AUC is 680 (goal AUC 400-550). Will decrease dose to maintain AUC within goal range. Patient is afebrile today, WBC decreased from 11.5 to 8.3. Scr is trending down from 1.7 to 1.4  Plan: - Decrease vancomycin to 1000 mg IV q24h (Est AUC: 540, Scr used: 1.4) - Continue cefepime 2 g q12h - Monitor labs, c/s, and vanc levels as indicated  Height: 5\' 4"  (162.6 cm) Weight: 212 lb (96.2 kg) IBW/kg (Calculated) : 54.7  Temp (24hrs), Avg:98.4 F (36.9 C), Min:97.9 F (36.6 C), Max:99.2 F (37.3 C)  Recent Labs  Lab 08/08/19 0839 08/09/19 0713  WBC 11.5* 8.3  CREATININE 1.73* 1.43*    Estimated Creatinine Clearance: 52.4 mL/min (A) (by C-G formula based on SCr of 1.43 mg/dL (H)).    No Known Allergies  Antimicrobials this admission: Vanco 11/22 >>  Cefepime 11/22 >>   Microbiology results: 11/22 COVID: neg  Thank you for allowing pharmacy to be a part of this patient's care.  Gaylan Gerold 08/09/2019 9:11 AM

## 2019-08-09 NOTE — Progress Notes (Signed)
[name]  PROGRESS NOTE    Natalie Contreras  [id number] DOB: 29-May-1966 DOA: 08/08/2019 PCP: Celene Squibb, MD   Brief Narrative:   53 yo F presenting with diabetic foot infection after failing outpt abx.    Assessment & Plan:   Principal Problem:   Abscess or cellulitis of foot Active Problems:   Benign essential HTN   Diabetic infection of right foot (HCC)   Tobacco abuse   PAD (peripheral artery disease) (HCC)   Chronic diastolic CHF (congestive heart failure) (HCC)   AKI (acute kidney injury) ([place])   Obesity (BMI 30-39.9)   Severe protein-calorie malnutrition (HCC)   Gangrene of right foot ([place])  Rt Footwound infection/cellulitis/possible gangrene     - failed outpatient antibiotic treatment (doxy, keflex, bactrim)     - sole of Rt foot wound with final wound cultures from 06/13/2019 with pansensitive MSSA and abundant Finegoldia Magna      - Patient now has new right fibular side foot wound that looks necrotic/gangrenous     - vanc, cefepime     - ortho and vasc surg consulted; appreciate assistance     - MRI results noted  PAD     - continue simvastatin 40 mg and aspirin     - smoking cessation strongly advised      - ABI ordered, appreciate vasc surg assistance; further imaging per them  acute kidney injury on CKD stage IIIa      - worsening renal function apparently due to dehydration compounded by diuretic and antibiotic/Bactrim use         - SCr on admission 1.73 (baseline ~1.2); she is 1.43 today     - holding metformin and torsemide     - watch nephrotoxins  DM2 uncontrolled     - recent A1c 10.7     - hold Metformin and Jardiance     - continue Levemir 35 units qhs, Amaryl qAM; SSI  HTN     - continue Coreg 3.125 mg BID; PRN labetolol  Chronic HFpEF     - appears euvolemic, last known EF 55 to 60%     - continue coreg as above     - hold torsemide as above  Tobacco abuse      - counseled against further tobacco use     - nicotine patch  DVT  prophylaxis: heparin Code Status: FULL   Disposition Plan: TBD  Consultants:   Orthopedicas  Vascular Srugery  Antimicrobials:   Vancomycin, cefepime  Subjective: "I promise I won't go anywhere."  Objective: Vitals:   08/08/19 1943 08/09/19 0400 08/09/19 0817 08/09/19 1408  BP: (!) 139/54 (!) 127/59 (!) 136/58 (!) 138/59  Pulse: 75 66 68 78  Resp: 18 15 16 16   Temp: 98.2 F (36.8 C) 97.9 F (36.6 C) 98.3 F (36.8 C) 98.7 F (37.1 C)  TempSrc: Oral Oral Oral Oral  SpO2: 97% 93% 92% 92%  Weight:      Height:        Intake/Output Summary (Last 24 hours) at 08/09/2019 1519 Last data filed at 08/09/2019 [id number] Gross per 24 hour  Intake 240 ml  Output 450 ml  Net -210 ml   Filed Weights   08/08/19 0824  Weight: 96.2 kg    Examination:  General: 53 y.o. female resting in bed in NAD Cardiovascular: RRR, +S1, S2, no m/g/r, equal pulses throughout Respiratory: CTABL, no w/r/r, normal WOB GI: BS+, NDNT, no masses noted, no organomegaly noted MSK: No  e/c/c; left foot with amputation of digits 2 - 5, right foot ulceration noted Neuro: alert to name, follows commands Psyc: Appropriate interaction and affect, calm/cooperative   Data Reviewed: I have personally reviewed following labs and imaging studies.  CBC: Recent Labs  Lab 08/08/19 0839 08/09/19 0713  WBC 11.5* 8.3  NEUTROABS 9.2*  --   HGB 10.6* 10.2*  HCT 34.5* 33.3*  MCV 87.6 86.9  PLT 508* 0000000*   Basic Metabolic Panel: Recent Labs  Lab 08/08/19 0839 08/09/19 0713  NA 136 140  K 3.1* 3.5  CL 95* 104  CO2 27 25  GLUCOSE 180* 131*  BUN 37* 31*  CREATININE 1.73* 1.43*  CALCIUM 8.6* 8.5*   GFR: Estimated Creatinine Clearance: 52.4 mL/min (A) (by C-G formula based on SCr of 1.43 mg/dL (H)). Liver Function Tests: No results for input(s): AST, ALT, ALKPHOS, BILITOT, PROT, ALBUMIN in the last 168 hours. No results for input(s): LIPASE, AMYLASE in the last 168 hours. No results for input(s):  AMMONIA in the last 168 hours. Coagulation Profile: No results for input(s): INR, PROTIME in the last 168 hours. Cardiac Enzymes: No results for input(s): CKTOTAL, CKMB, CKMBINDEX, TROPONINI in the last 168 hours. BNP (last 3 results) No results for input(s): PROBNP in the last 8760 hours. HbA1C: No results for input(s): HGBA1C in the last 72 hours. CBG: Recent Labs  Lab 08/08/19 1610 08/08/19 1942 08/09/19 0648 08/09/19 1139  GLUCAP 169* 169* 131* 99   Lipid Profile: No results for input(s): CHOL, HDL, LDLCALC, TRIG, CHOLHDL, LDLDIRECT in the last 72 hours. Thyroid Function Tests: No results for input(s): TSH, T4TOTAL, FREET4, T3FREE, THYROIDAB in the last 72 hours. Anemia Panel: No results for input(s): VITAMINB12, FOLATE, FERRITIN, TIBC, IRON, RETICCTPCT in the last 72 hours. Sepsis Labs: No results for input(s): PROCALCITON, LATICACIDVEN in the last 168 hours.  Recent Results (from the past 240 hour(s))  SARS CORONAVIRUS 2 (TAT 6-24 HRS) Nasopharyngeal Nasopharyngeal Swab     Status: None   Collection Time: 08/08/19 11:34 AM   Specimen: Nasopharyngeal Swab  Result Value Ref Range Status   SARS Coronavirus 2 NEGATIVE NEGATIVE Final    Comment: (NOTE) SARS-CoV-2 target nucleic acids are NOT DETECTED. The SARS-CoV-2 RNA is generally detectable in upper and lower respiratory specimens during the acute phase of infection. Negative results do not preclude SARS-CoV-2 infection, do not rule out co-infections with other pathogens, and should not be used as the sole basis for treatment or other patient management decisions. Negative results must be combined with clinical observations, patient history, and epidemiological information. The expected result is Negative. Fact Sheet for Patients: SugarRoll.be Fact Sheet for Healthcare Providers: https://www.woods-mathews.com/ This test is not yet approved or cleared by the [place] FDA  and  has been authorized for detection and/or diagnosis of SARS-CoV-2 by FDA under an Emergency Use Authorization (EUA). This EUA will remain  in effect (meaning this test can be used) for the duration of the COVID-19 declaration under Section 56 4(b)(1) of the Act, 21 U.S.C. section 360bbb-3(b)(1), unless the authorization is terminated or revoked sooner. Performed at [facility], [place] [address], [place], [place] [address]       Radiology Studies: Mr Foot Right W Wo Contrast  Result Date: 08/09/2019 CLINICAL DATA:  Diabetes and foot swelling, possible osteomyelitis. Neuropathy. EXAM: MRI OF THE RIGHT FOREFOOT WITHOUT AND WITH CONTRAST TECHNIQUE: Multiplanar, multisequence MR imaging of the right forefoot was performed before and after the administration of intravenous contrast. CONTRAST:  17mL GADAVIST GADOBUTROL 1 MMOL/ML IV SOLN COMPARISON:  None. FINDINGS: Bones/Joint/Cartilage Degenerative arthropathy at the calcaneocuboid joint with subcortical marrow edema spurring as well as a small calcaneocuboid joint effusion. Ligaments The Lisfranc ligament appears intact. Muscles and Tendons There is atrophy and edema the abductor digiti minimi muscle and flexor digiti minimi brevis muscle likely related to cutaneous ulceration along the plantar foot and overlying lateral ulceration adjacent to base of the fifth metatarsal. No appreciable drainable abscess. Low-level edema tracks along the remaining plantar musculature of the foot. Soft tissues Cutaneous ulceration along the plantar foot below the base of the fifth metatarsal, and also lateral to the base of the fifth metatarsal. IMPRESSION: 1. Cutaneous ulceration along the plantar foot and lateral to the base of the fifth metatarsal. No drainable abscess or osteomyelitis. 2. Atrophy and edema in the abductor digiti minimi and flexor digiti minimi brevis muscle, likely from myositis. 3. Advanced localized degenerative arthropathy at the  calcaneocuboid joint. Electronically Signed   By: Van Clines M.D.   On: 08/09/2019 09:56   Dg Foot Complete Right  Result Date: 08/08/2019 CLINICAL DATA:  Wounds on right foot for the past month. EXAM: RIGHT FOOT COMPLETE - 3+ VIEW COMPARISON:  07/21/2019 FINDINGS: Diffuse soft tissue swelling. Soft tissue defect about the plantar surface of the midfoot on the lateral view. Soft tissue defect about the lateral mid foot on the AP and oblique views may represent the same area as on the lateral. Small Achilles and calcaneal spurs.  Vascular calcifications. No osseous destruction.  Midfoot degenerative changes. IMPRESSION: Soft tissue swelling with at least 1 soft tissue ulcer about the plantar midfoot as detailed above. No plain film evidence of osteomyelitis. Electronically Signed   By: Abigail Miyamoto M.D.   On: 08/08/2019 09:22   Vas [place] Burnard Bunting With/wo Tbi  Result Date: 08/09/2019 LOWER EXTREMITY DOPPLER STUDY Indications: Rest pain, and peripheral artery disease. High Risk Factors: Hypertension, Diabetes.  Comparison Study: no prior Performing Technologist: Abram Sander RVS  Examination Guidelines: A complete evaluation includes at minimum, Doppler waveform signals and systolic blood pressure reading at the level of bilateral brachial, anterior tibial, and posterior tibial arteries, when vessel segments are accessible. Bilateral testing is considered an integral part of a complete examination. Photoelectric Plethysmograph (PPG) waveforms and toe systolic pressure readings are included as required and additional duplex testing as needed. Limited examinations for reoccurring indications may be performed as noted.  ABI Findings: +--------+------------------+-----+----------+-----------+  Right    Rt Pressure (mmHg) Index Waveform   Comment      +--------+------------------+-----+----------+-----------+  Brachial 169                      triphasic                +--------+------------------+-----+----------+-----------+  PTA                                          not audible  +--------+------------------+-----+----------+-----------+  DP       58                 0.34  monophasic              +--------+------------------+-----+----------+-----------+ +--------+------------------+-----+---------+-------+  Left     Lt Pressure (mmHg) Index Waveform  Comment  +--------+------------------+-----+---------+-------+  Brachial 161  triphasic          +--------+------------------+-----+---------+-------+  PTA      105                0.62  biphasic           +--------+------------------+-----+---------+-------+  DP       107                0.63  biphasic           +--------+------------------+-----+---------+-------+ +-------+-----------+-----------+------------+------------+  ABI/TBI Today's ABI Today's TBI Previous ABI Previous TBI  +-------+-----------+-----------+------------+------------+  Right   0.34                                               +-------+-----------+-----------+------------+------------+  Left    0.63                                               +-------+-----------+-----------+------------+------------+  Summary: Right: Resting right ankle-brachial index indicates severe right lower extremity arterial disease. Left: Resting left ankle-brachial index indicates moderate left lower extremity arterial disease.  *See table(s) above for measurements and observations.  Electronically signed by Curt Jews MD on 08/09/2019 at 1:34:25 PM.   Final      Scheduled Meds:  aspirin EC  81 mg Oral Q breakfast   carvedilol  3.125 mg Oral BID WC   gabapentin  300 mg Oral TID   heparin  5,000 Units Subcutaneous Q8H   [START ON 08/10/2019] influenza vac split quadrivalent PF  0.5 mL Intramuscular Tomorrow-1000   insulin aspart  0-15 Units Subcutaneous TID WC   insulin aspart  0-5 Units Subcutaneous QHS   insulin aspart  3 Units  Subcutaneous TID WC   insulin detemir  35 Units Subcutaneous QHS   nicotine  14 mg Transdermal Daily   [START ON 08/10/2019] pneumococcal 23 valent vaccine  0.5 mL Intramuscular Tomorrow-1000   polyethylene glycol  17 g Oral BID   simvastatin  40 mg Oral Daily   sodium chloride flush  3 mL Intravenous Q12H   Continuous Infusions:  sodium chloride     sodium chloride 75 mL/hr (08/08/19 2124)   ceFEPime (MAXIPIME) IV 2 g (08/09/19 1115)   vancomycin 1,000 mg (08/09/19 1232)     LOS: 1 day    Time spent: 25 minutes spent in the coordination of care today.    Jonnie Finner, DO [facility] Pager [phone]  If 7PM-7AM, please contact night-coverage [email or web address] Password [facility] 08/09/2019, 3:19 PM

## 2019-08-09 NOTE — Consult Note (Signed)
Hospital Consult    Reason for Consult:  Right foot wound Referring Physician:  Dr Denton Brick MRN #:  [id number]  History of Present Illness: This is a 53 y.o. female history of previous left-sided toe amputations.  Has been followed for several months for right plantar wound.  Now has lateral foot wounds.  These have been evaluated by Dr. Sharol Given.  She does have known peripheral arterial disease with previous ABIs 0.9 right 0.7 left.  States she has never had vascular disease.  She is a diabetic.  She is also a chronic every day smoker states that she plans to quit at this time.  She does take aspirin and statin at home.  Past Medical History:  Diagnosis Date  . Acute diastolic CHF (congestive heart failure) ([place]) 10/28/2016  . Carotid stenosis, asymptomatic, bilateral 01/15/2017  . Diabetes mellitus without complication ([place])   . Heart murmur   . Hypertension   . PAD (peripheral artery disease) ([place]) 04/10/2017   Left ABI 0.74. Right ABI 0.96.    Past Surgical History:  Procedure Laterality Date  . AMPUTATION TOE Left 05/05/2017   Procedure: LEFT SECOND TOE AMPUTATION;  Surgeon: Aviva Signs, MD;  Location: [facility];  Service: General;  Laterality: Left;  . CATARACT EXTRACTION W/PHACO Left 02/08/2016   Procedure: CATARACT EXTRACTION PHACO AND INTRAOCULAR LENS PLACEMENT (IOC);  Surgeon: Tonny Branch, MD;  Location: [facility];  Service: Ophthalmology;  Laterality: Left;  CDE 11.43   . CATARACT EXTRACTION W/PHACO Right 02/26/2016   Procedure: CATARACT EXTRACTION PHACO AND INTRAOCULAR LENS PLACEMENT RIGHT; CDE:  16.90;  Surgeon: Tonny Branch, MD;  Location: [facility];  Service: Ophthalmology;  Laterality: Right;  . CHOLECYSTECTOMY    . FOREIGN BODY REMOVAL Left 04/09/2017   Procedure: FOREIGN BODY REMOVAL ADULT FOOT;  Surgeon: Aviva Signs, MD;  Location: [facility];  Service: General;  Laterality: Left;  . TRANSMETATARSAL AMPUTATION Left 07/21/2017   Procedure: TRANSMETATARSAL AMPUTATION LEFT FOOT;  Surgeon:  Aviva Signs, MD;  Location: [facility];  Service: General;  Laterality: Left;    No Known Allergies  Prior to Admission medications   Medication Sig Start Date End Date Taking? Authorizing Provider  acetaminophen (TYLENOL) 325 MG tablet Take 2 tablets (650 mg total) by mouth every 6 (six) hours as needed for mild pain, fever or headache (or Fever >/= 101). 06/15/19  Yes Emokpae, Courage, MD  aspirin EC 81 MG tablet Take 1 tablet (81 mg total) by mouth daily with breakfast. 06/15/19  Yes Emokpae, Courage, MD  carvedilol (COREG) 3.125 MG tablet Take 1 tablet (3.125 mg total) by mouth 2 (two) times daily with a meal. 06/15/19  Yes Emokpae, Courage, MD  empagliflozin (JARDIANCE) 25 MG TABS tablet Take 25 mg by mouth daily.   Yes [provider]  insulin detemir (LEVEMIR) 100 UNIT/ML injection Inject 35 Units into the skin at bedtime.   Yes [provider]  metFORMIN (GLUCOPHAGE) 500 MG tablet Take 1,000 mg by mouth daily.    Yes [provider]  oxyCODONE (OXY IR/ROXICODONE) 5 MG immediate release tablet Take 5-10 mg by mouth every 4 (four) hours as needed for moderate pain.  08/02/19  Yes [provider]  simvastatin (ZOCOR) 40 MG tablet Take 40 mg by mouth daily.   Yes [provider]  torsemide (DEMADEX) 20 MG tablet Take 1 tablet (20 mg total) by mouth daily. 06/15/19  Yes Roxan Hockey, MD    Social History   Socioeconomic History  . Marital status: Single  Spouse name: Not on file  . Number of children: Not on file  . Years of education: Not on file  . Highest education level: Not on file  Occupational History  . Not on file  Social Needs  . Financial resource strain: Not on file  . Food insecurity    Worry: Not on file    Inability: Not on file  . Transportation needs    Medical: Not on file    Non-medical: Not on file  Tobacco Use  . Smoking status: Current Every Day Smoker    Packs/day: 0.75    Years: 25.00    Pack years: 18.75     Types: Cigarettes    Start date: 09/16/1984  . Smokeless tobacco: Never Used  Substance and Sexual Activity  . Alcohol use: No  . Drug use: No  . Sexual activity: Yes    Birth control/protection: None  Lifestyle  . Physical activity    Days per week: Not on file    Minutes per session: Not on file  . Stress: Not on file  Relationships  . Social Herbalist on phone: Not on file    Gets together: Not on file    Attends religious service: Not on file    Active member of club or organization: Not on file    Attends meetings of clubs or organizations: Not on file    Relationship status: Not on file  . Intimate partner violence    Fear of current or ex partner: Not on file    Emotionally abused: Not on file    Physically abused: Not on file    Forced sexual activity: Not on file  Other Topics Concern  . Not on file  Social History Narrative  . Not on file     Family History  Problem Relation Age of Onset  . Lung cancer Mother   . Lung cancer Father   . AAA (abdominal aortic aneurysm) Brother     ROS: Cardiovascular: []  chest pain/pressure []  palpitations []  SOB lying flat []  DOE []  pain in legs while walking [x]  pain in legs at rest []  pain in legs at night []  non-healing ulcers []  hx of DVT [x]  swelling in legs  Pulmonary: []  productive cough []  asthma/wheezing []  home O2  Neurologic: []  weakness in []  arms []  legs []  numbness in []  arms []  legs []  hx of CVA []  mini stroke [] difficulty speaking or slurred speech []  temporary loss of vision in one eye []  dizziness  Hematologic: []  hx of cancer []  bleeding problems []  problems with blood clotting easily  Endocrine:   []  diabetes []  thyroid disease  GI []  vomiting blood []  blood in stool  GU: []  CKD/renal failure []  HD--[]  M/W/F or []  T/T/S []  burning with urination []  blood in urine  Psychiatric: []  anxiety []  depression  Musculoskeletal: []  arthritis []  joint pain   Integumentary: []  rashes [x]  ulcers  Constitutional: []  fever []  chills   Physical Examination  Vitals:   08/08/19 1521 08/08/19 1943  BP: (!) 141/56 (!) 139/54  Pulse: 76 75  Resp: 18 18  Temp: 99.2 F (37.3 C) 98.2 F (36.8 C)  SpO2: 96% 97%   Body mass index is 36.39 kg/m.  General:  nad HENT: WNL, normocephalic Pulmonary: normal non-labored breathing Cardiac: Bilateral common femoral arteries are palpable, I cannot palpate popliteals bilaterally Abdomen:  soft, NT/ND, no masses Extremities: Right foot images were reviewed dressing is currently in place  Musculoskeletal: no muscle wasting or atrophy  Neurologic: A&O X 3; Appropriate Affect ; SENSATION: normal; MOTOR FUNCTION:  moving all extremities equally. Speech is fluent/normal   CBC    Component Value Date/Time   WBC 11.5 (H) 08/08/2019 0839   RBC 3.94 08/08/2019 0839   HGB 10.6 (L) 08/08/2019 0839   HCT 34.5 (L) 08/08/2019 0839   PLT 508 (H) 08/08/2019 0839   MCV 87.6 08/08/2019 0839   MCH 26.9 08/08/2019 0839   MCHC 30.7 08/08/2019 0839   RDW 15.9 (H) 08/08/2019 0839   LYMPHSABS 1.5 08/08/2019 0839   MONOABS 0.5 08/08/2019 0839   EOSABS 0.1 08/08/2019 0839   BASOSABS 0.1 08/08/2019 0839    BMET    Component Value Date/Time   NA 136 08/08/2019 0839   K 3.1 (L) 08/08/2019 0839   CL 95 (L) 08/08/2019 0839   CO2 27 08/08/2019 0839   GLUCOSE 180 (H) 08/08/2019 0839   BUN 37 (H) 08/08/2019 0839   CREATININE 1.73 (H) 08/08/2019 0839   CREATININE 0.83 03/04/2017 1637   CALCIUM 8.6 (L) 08/08/2019 0839   GFRNONAA 34 (L) 08/08/2019 0839   GFRAA 39 (L) 08/08/2019 0839    COAGS: No results found for: INR, PROTIME   Non-Invasive Vascular Imaging:   Previous ABI 2017 0.96/0.79  ABIs today 0.34 monophasic right and 0.63 biphasic left    ASSESSMENT/PLAN: This is a 53 y.o. female is here with right foot ulceration. Plan for aortogram with runoff and possible right sided intervention. ABI's  ordered. Will use CO2 for renal protection.  N.p.o. past midnight.  I discussed the risk benefits alternatives with the patient she agrees to proceed.  I have also discussed the need for likely wound debridement with both patient and Dr. Sharol Given.   C. Donzetta Matters, MD [facility] Office: [phone] Pager: [phone]

## 2019-08-09 NOTE — Plan of Care (Signed)

## 2019-08-09 NOTE — Progress Notes (Signed)
RN went to obtain informed consent for procedure tomorrow, pt did not feel comfortable signing the consent at this time, she would like to speak with the surgeon again tomorrow. I have left the filled out and unsigned consent in pt chart. Yusuf Yu Elon Spanner, RN 08/09/2019 5:56 PM

## 2019-08-09 NOTE — Plan of Care (Signed)
  Problem: Safety: Goal: Ability to remain free from injury will improve Outcome: Progressing   

## 2019-08-09 NOTE — Progress Notes (Signed)
RN was asked by Dr. Sharol Given why the patient has an NPO order.   NPO order placed by Dr. Donzetta Matters.   RN called [facility] to receive clarification as to why the patient was placed on NPO.   On call for [facility], Dr. Donnetta Hutching, returned call and informed RN that the best person to advise in this matter is Dr. Donzetta Matters.  RN called Dr. Donzetta Matters and received a response that the patient may receive an angiogram on today and should remain NPO for this reason.   RN called Dr. Sharol Given and informed him of Dr. Claretha Cooper order and rationale.   Receiving nurse [place] updated of order and rationale.

## 2019-08-09 NOTE — Consult Note (Signed)
ORTHOPAEDIC CONSULTATION  REQUESTING PHYSICIAN: Jonnie Finner, DO  Chief Complaint: Gangrenous ulcer lateral aspect right foot.  HPI: Natalie Contreras is a 53 y.o. female who presents with gangrenous ulcer lateral aspect right foot.  Has uncontrolled type 2 diabetes with severe protein caloric malnutrition.  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  This has healed well.  Past Medical History:  Diagnosis Date  . Acute diastolic CHF (congestive heart failure) ([place]) 10/28/2016  . Carotid stenosis, asymptomatic, bilateral 01/15/2017  . Diabetes mellitus without complication ([place])   . Heart murmur   . Hypertension   . PAD (peripheral artery disease) ([place]) 04/10/2017   Left ABI 0.74. Right ABI 0.96.   Past Surgical History:  Procedure Laterality Date  . AMPUTATION TOE Left 05/05/2017   Procedure: LEFT SECOND TOE AMPUTATION;  Surgeon: Aviva Signs, MD;  Location: [facility];  Service: General;  Laterality: Left;  . CATARACT EXTRACTION W/PHACO Left 02/08/2016   Procedure: CATARACT EXTRACTION PHACO AND INTRAOCULAR LENS PLACEMENT (IOC);  Surgeon: Tonny Branch, MD;  Location: [facility];  Service: Ophthalmology;  Laterality: Left;  CDE 11.43   . CATARACT EXTRACTION W/PHACO Right 02/26/2016   Procedure: CATARACT EXTRACTION PHACO AND INTRAOCULAR LENS PLACEMENT RIGHT; CDE:  16.90;  Surgeon: Tonny Branch, MD;  Location: [facility];  Service: Ophthalmology;  Laterality: Right;  . CHOLECYSTECTOMY    . FOREIGN BODY REMOVAL Left 04/09/2017   Procedure: FOREIGN BODY REMOVAL ADULT FOOT;  Surgeon: Aviva Signs, MD;  Location: [facility];  Service: General;  Laterality: Left;  . TRANSMETATARSAL AMPUTATION Left 07/21/2017   Procedure: TRANSMETATARSAL AMPUTATION LEFT FOOT;  Surgeon: Aviva Signs, MD;  Location: [facility];  Service: General;  Laterality: Left;   Social History   Socioeconomic History  . Marital status: Single    Spouse name: Not on file  . Number of children: Not on file   . Years of education: Not on file  . Highest education level: Not on file  Occupational History  . Not on file  Social Needs  . Financial resource strain: Not on file  . Food insecurity    Worry: Not on file    Inability: Not on file  . Transportation needs    Medical: Not on file    Non-medical: Not on file  Tobacco Use  . Smoking status: Current Every Day Smoker    Packs/day: 0.75    Years: 25.00    Pack years: 18.75    Types: Cigarettes    Start date: 09/16/1984  . Smokeless tobacco: Never Used  Substance and Sexual Activity  . Alcohol use: No  . Drug use: No  . Sexual activity: Yes    Birth control/protection: None  Lifestyle  . Physical activity    Days per week: Not on file    Minutes per session: Not on file  . Stress: Not on file  Relationships  . Social Herbalist on phone: Not on file    Gets together: Not on file    Attends religious service: Not on file    Active member of club or organization: Not on file    Attends meetings of clubs or organizations: Not on file    Relationship status: Not on file  Other Topics Concern  . Not on file  Social History Narrative  . Not on file   Family History  Problem Relation Contreras of Onset  . Lung cancer Mother   .  Lung cancer Father   . AAA (abdominal aortic aneurysm) Brother    - negative except otherwise stated in the family history section No Known Allergies Prior to Admission medications   Medication Sig Start Date End Date Taking? Authorizing Provider  acetaminophen (TYLENOL) 325 MG tablet Take 2 tablets (650 mg total) by mouth every 6 (six) hours as needed for mild pain, fever or headache (or Fever >/= 101). 06/15/19  Yes Emokpae, Courage, MD  aspirin EC 81 MG tablet Take 1 tablet (81 mg total) by mouth daily with breakfast. 06/15/19  Yes Emokpae, Courage, MD  carvedilol (COREG) 3.125 MG tablet Take 1 tablet (3.125 mg total) by mouth 2 (two) times daily with a meal. 06/15/19  Yes Emokpae, Courage, MD   empagliflozin (JARDIANCE) 25 MG TABS tablet Take 25 mg by mouth daily.   Yes [provider]  insulin detemir (LEVEMIR) 100 UNIT/ML injection Inject 35 Units into the skin at bedtime.   Yes [provider]  metFORMIN (GLUCOPHAGE) 500 MG tablet Take 1,000 mg by mouth daily.    Yes [provider]  oxyCODONE (OXY IR/ROXICODONE) 5 MG immediate release tablet Take 5-10 mg by mouth every 4 (four) hours as needed for moderate pain.  08/02/19  Yes [provider]  simvastatin (ZOCOR) 40 MG tablet Take 40 mg by mouth daily.   Yes [provider]  torsemide (DEMADEX) 20 MG tablet Take 1 tablet (20 mg total) by mouth daily. 06/15/19  Yes Roxan Hockey, MD   Dg Foot Complete Right  Result Date: 08/08/2019 CLINICAL DATA:  Wounds on right foot for the past month. EXAM: RIGHT FOOT COMPLETE - 3+ VIEW COMPARISON:  07/21/2019 FINDINGS: Diffuse soft tissue swelling. Soft tissue defect about the plantar surface of the midfoot on the lateral view. Soft tissue defect about the lateral mid foot on the AP and oblique views may represent the same area as on the lateral. Small Achilles and calcaneal spurs.  Vascular calcifications. No osseous destruction.  Midfoot degenerative changes. IMPRESSION: Soft tissue swelling with at least 1 soft tissue ulcer about the plantar midfoot as detailed above. No plain film evidence of osteomyelitis. Electronically Signed   By: Abigail Miyamoto M.D.   On: 08/08/2019 09:22   - pertinent xrays, CT, MRI studies were reviewed and independently interpreted  Positive ROS: All other systems have been reviewed and were otherwise negative with the exception of those mentioned in the HPI and as above.  Physical Exam: General: Alert, no acute distress Psychiatric: Patient is competent for consent with normal mood and affect Lymphatic: No axillary or cervical lymphadenopathy Cardiovascular: No pedal edema Respiratory: No cyanosis, no use of accessory  musculature GI: No organomegaly, abdomen is soft and non-tender    Images:  @ENCIMAGES @  Labs:  Lab Results  Component Value Date   HGBA1C 10.7 (H) 06/13/2019   HGBA1C 7.1 (H) 07/19/2017   HGBA1C 7.0 (H) 05/01/2017   ESRSEDRATE 122 (H) 08/08/2019   CRP 11.7 (H) 08/08/2019   REPTSTATUS 06/20/2019 FINAL 06/13/2019   GRAMSTAIN  06/13/2019    CORRECTED RESULTS ABUNDANT WBC PRESENT, PREDOMINANTLY PMN ABUNDANT GRAM POSITIVE COCCI PREVIOUSLY REPORTED AS: NO ORGANISMS SEEN MODERATE WBC PRESENT, PREDOMINANTLY PMN Performed at [place]: RN Nash Mantis [phone] [id number] FCP Performed at [facility], [place] [address], [place], [place] [address]    CULT  06/13/2019    FEW STAPHYLOCOCCUS AUREUS ABUNDANT Success STAPHYLOCOCCUS AUREUS 06/13/2019  Lab Results  Component Value Date   ALBUMIN 2.7 (L) 07/17/2017   ALBUMIN 2.2 (L) 04/15/2017   ALBUMIN 2.3 (L) 04/14/2017    Neurologic: Patient does not have protective sensation bilateral lower extremities.   MUSCULOSKELETAL:   Skin: Examination patient has a black gangrenous ulcer over the lateral aspect of the right foot.  She does have cellulitis extending up into the ankle.  Patient does not have a palpable dorsalis pedis or posterior tibial pulse.  Review of the radiographs shows air in the soft tissue of the ulcer over the lateral column of the right foot at the base of the fifth metatarsal as well as over the cuboid.  Radiographs do show destructive bony changes involving the base of the fifth metatarsal the cuboid and the calcaneus.  Patient's recent hemoglobin A1c is 10.7 her albumin is ranged from 2.2-2.7.  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  Plan:  Plan: I agree with vascular work-up to determine if there are revascularization options.  I anticipate the MRI scan will confirm the osteomyelitis of the fifth metatarsal cuboid and calcaneus.  With the extensive bony destruction of the base of the lateral column right foot, foot salvage intervention options will be difficult.  Thank you for the consult and the opportunity to see Natalie Contreras, [place] [phone] 7:50 AM

## 2019-08-10 ENCOUNTER — Encounter (HOSPITAL_COMMUNITY)
Admission: EM | Disposition: A | Discharge status: Home Health | Payer: Self-pay | Source: Home / Self Care | Attending: Internal Medicine

## 2019-08-10 DIAGNOSIS — E669 Obesity, unspecified: Secondary | ICD-10-CM

## 2019-08-10 DIAGNOSIS — I5032 Chronic diastolic (congestive) heart failure: Secondary | ICD-10-CM

## 2019-08-10 DIAGNOSIS — I10 Essential (primary) hypertension: Secondary | ICD-10-CM

## 2019-08-10 DIAGNOSIS — N179 Acute kidney failure, unspecified: Secondary | ICD-10-CM

## 2019-08-10 HISTORY — PX: PERIPHERAL VASCULAR BALLOON ANGIOPLASTY: CATH118281

## 2019-08-10 HISTORY — PX: ABDOMINAL AORTOGRAM W/LOWER EXTREMITY: CATH118223

## 2019-08-10 HISTORY — PX: PERIPHERAL VASCULAR ATHERECTOMY: CATH118256

## 2019-08-10 LAB — RENAL FUNCTION PANEL
Albumin: 1.8 g/dL — ABNORMAL LOW (ref 3.5–5.0)
Anion gap: 8 (ref 5–15)
BUN: 33 mg/dL — ABNORMAL HIGH (ref 6–20)
CO2: 25 mmol/L (ref 22–32)
Calcium: 8.1 mg/dL — ABNORMAL LOW (ref 8.9–10.3)
Chloride: 101 mmol/L (ref 98–111)
Creatinine, Ser: 1.71 mg/dL — ABNORMAL HIGH (ref 0.44–1.00)
GFR calc Af Amer: 39 mL/min — ABNORMAL LOW (ref >60)
GFR calc non Af Amer: 34 mL/min — ABNORMAL LOW (ref >60)
Glucose, Bld: 138 mg/dL — ABNORMAL HIGH (ref 70–99)
Phosphorus: 3.2 mg/dL (ref 2.5–4.6)
Potassium: 3.4 mmol/L — ABNORMAL LOW (ref 3.5–5.1)
Sodium: 134 mmol/L — ABNORMAL LOW (ref 135–145)

## 2019-08-10 LAB — CBC WITH DIFFERENTIAL/PLATELET
Abs Immature Granulocytes: 0.05 10*3/uL (ref 0.00–0.07)
Basophils Absolute: 0.1 10*3/uL (ref 0.0–0.1)
Basophils Relative: 1 %
Eosinophils Absolute: 0.3 10*3/uL (ref 0.0–0.5)
Eosinophils Relative: 3 %
HCT: 29.5 % — ABNORMAL LOW (ref 36.0–46.0)
Hemoglobin: 9 g/dL — ABNORMAL LOW (ref 12.0–15.0)
Immature Granulocytes: 1 %
Lymphocytes Relative: 26 %
Lymphs Abs: 2.6 10*3/uL (ref 0.7–4.0)
MCH: 26.6 pg (ref 26.0–34.0)
MCHC: 30.5 g/dL (ref 30.0–36.0)
MCV: 87.3 fL (ref 80.0–100.0)
Monocytes Absolute: 0.7 10*3/uL (ref 0.1–1.0)
Monocytes Relative: 8 %
Neutro Abs: 6.1 10*3/uL (ref 1.7–7.7)
Neutrophils Relative %: 61 %
Platelets: 411 10*3/uL — ABNORMAL HIGH (ref 150–400)
RBC: 3.38 MIL/uL — ABNORMAL LOW (ref 3.87–5.11)
RDW: 15.9 % — ABNORMAL HIGH (ref 11.5–15.5)
WBC: 9.8 10*3/uL (ref 4.0–10.5)
nRBC: 0 % (ref 0.0–0.2)

## 2019-08-10 LAB — GLUCOSE, CAPILLARY
Glucose-Capillary: 127 mg/dL — ABNORMAL HIGH (ref 70–99)
Glucose-Capillary: 132 mg/dL — ABNORMAL HIGH (ref 70–99)
Glucose-Capillary: 118 mg/dL — ABNORMAL HIGH (ref 70–99)
Glucose-Capillary: 156 mg/dL — ABNORMAL HIGH (ref 70–99)

## 2019-08-10 LAB — MAGNESIUM: Magnesium: 2.2 mg/dL (ref 1.7–2.4)

## 2019-08-10 SURGERY — ABDOMINAL AORTOGRAM W/LOWER EXTREMITY
Anesthesia: LOCAL | Laterality: Right

## 2019-08-10 MED ORDER — HYDRALAZINE HCL 20 MG/ML IJ SOLN: 5.0000 mg | INTRAMUSCULAR | Status: DC | PRN

## 2019-08-10 MED ORDER — ACETAMINOPHEN 325 MG PO TABS: 650.0000 mg | ORAL_TABLET | ORAL | Status: DC | PRN

## 2019-08-10 MED ORDER — HEPARIN SODIUM (PORCINE) 1000 UNIT/ML IJ SOLN
INTRAMUSCULAR | Status: DC | PRN
  Administered 2019-08-10: 10000 [IU] via INTRAVENOUS

## 2019-08-10 MED ORDER — SODIUM CHLORIDE 0.9 % IV SOLN: INTRAVENOUS | Status: AC

## 2019-08-10 MED ORDER — HEPARIN SODIUM (PORCINE) 5000 UNIT/ML IJ SOLN
5000.0000 [IU] | Freq: Three times a day (TID) | INTRAMUSCULAR | Status: DC
  Administered 2019-08-11 – 2019-08-14 (×9): 5000 [IU] via SUBCUTANEOUS
  Filled 2019-08-10 (×9): qty 1

## 2019-08-10 MED ORDER — IODIXANOL 320 MG/ML IV SOLN
INTRAVENOUS | Status: DC | PRN
  Administered 2019-08-10: 70 mL via INTRA_ARTERIAL

## 2019-08-10 MED ORDER — POTASSIUM CHLORIDE CRYS ER 20 MEQ PO TBCR
40.0000 meq | EXTENDED_RELEASE_TABLET | Freq: Every day | ORAL | Status: DC
  Administered 2019-08-10: 18:00:00 40 meq via ORAL
  Filled 2019-08-10: qty 2

## 2019-08-10 MED ORDER — SODIUM CHLORIDE 0.9% FLUSH
3.0000 mL | Freq: Two times a day (BID) | INTRAVENOUS | Status: DC
  Administered 2019-08-11 – 2019-08-13 (×4): 3 mL via INTRAVENOUS

## 2019-08-10 MED ORDER — FENTANYL CITRATE (PF) 100 MCG/2ML IJ SOLN
INTRAMUSCULAR | Status: DC | PRN
  Administered 2019-08-10: 50 ug via INTRAVENOUS
  Administered 2019-08-10: 25 ug via INTRAVENOUS
  Administered 2019-08-10: 50 ug via INTRAVENOUS
  Administered 2019-08-10: 25 ug via INTRAVENOUS

## 2019-08-10 MED ORDER — VANCOMYCIN HCL IN DEXTROSE 750-5 MG/150ML-% IV SOLN
750.0000 mg | INTRAVENOUS | Status: DC
  Administered 2019-08-12: 750 mg via INTRAVENOUS
  Filled 2019-08-10 (×3): qty 150

## 2019-08-10 MED ORDER — MIDAZOLAM HCL 2 MG/2ML IJ SOLN
INTRAMUSCULAR | Status: DC | PRN
  Administered 2019-08-10: 1 mg via INTRAVENOUS

## 2019-08-10 MED ORDER — CLOPIDOGREL BISULFATE 300 MG PO TABS
ORAL_TABLET | ORAL | Status: DC | PRN
  Administered 2019-08-10: 300 mg via ORAL

## 2019-08-10 MED ORDER — OXYCODONE HCL 5 MG PO TABS: 5.0000 mg | ORAL_TABLET | ORAL | Status: DC | PRN

## 2019-08-10 MED ORDER — MORPHINE SULFATE (PF) 2 MG/ML IV SOLN
2.0000 mg | INTRAVENOUS | Status: DC | PRN
  Administered 2019-08-11: 2 mg via INTRAVENOUS
  Filled 2019-08-10: qty 1

## 2019-08-10 MED ORDER — FENTANYL CITRATE (PF) 100 MCG/2ML IJ SOLN
INTRAMUSCULAR | Status: AC
  Filled 2019-08-10: qty 2

## 2019-08-10 MED ORDER — LABETALOL HCL 5 MG/ML IV SOLN
INTRAVENOUS | Status: AC
  Filled 2019-08-10: qty 4

## 2019-08-10 MED ORDER — CLOPIDOGREL BISULFATE 300 MG PO TABS
ORAL_TABLET | ORAL | Status: AC
  Filled 2019-08-10: qty 1

## 2019-08-10 MED ORDER — LIDOCAINE HCL (PF) 1 % IJ SOLN
INTRAMUSCULAR | Status: AC
  Filled 2019-08-10: qty 30

## 2019-08-10 MED ORDER — SIMVASTATIN 20 MG PO TABS: 40.0000 mg | ORAL_TABLET | Freq: Every day | ORAL | Status: DC

## 2019-08-10 MED ORDER — HEPARIN (PORCINE) IN NACL 1000-0.9 UT/500ML-% IV SOLN
INTRAVENOUS | Status: AC
  Filled 2019-08-10: qty 1000

## 2019-08-10 MED ORDER — SODIUM CHLORIDE 0.9% FLUSH: 3.0000 mL | INTRAVENOUS | Status: DC | PRN

## 2019-08-10 MED ORDER — LIDOCAINE HCL (PF) 1 % IJ SOLN
INTRAMUSCULAR | Status: DC | PRN
  Administered 2019-08-10: 20 mL via INTRADERMAL

## 2019-08-10 MED ORDER — MIDAZOLAM HCL 2 MG/2ML IJ SOLN
INTRAMUSCULAR | Status: AC
  Filled 2019-08-10: qty 2

## 2019-08-10 MED ORDER — CLOPIDOGREL BISULFATE 75 MG PO TABS: 300.0000 mg | ORAL_TABLET | Freq: Once | ORAL | Status: DC

## 2019-08-10 MED ORDER — SODIUM CHLORIDE 0.9 % IV SOLN: 250.0000 mL | INTRAVENOUS | Status: DC | PRN

## 2019-08-10 MED ORDER — HEPARIN SODIUM (PORCINE) 1000 UNIT/ML IJ SOLN
INTRAMUSCULAR | Status: AC
  Filled 2019-08-10: qty 1

## 2019-08-10 MED ORDER — HEPARIN (PORCINE) IN NACL 1000-0.9 UT/500ML-% IV SOLN
INTRAVENOUS | Status: DC | PRN
  Administered 2019-08-10 (×2): 500 mL

## 2019-08-10 MED ORDER — CLOPIDOGREL BISULFATE 75 MG PO TABS
75.0000 mg | ORAL_TABLET | Freq: Every day | ORAL | Status: DC
  Administered 2019-08-11 – 2019-08-13 (×3): 75 mg via ORAL
  Filled 2019-08-10 (×3): qty 1

## 2019-08-10 MED ORDER — ONDANSETRON HCL 4 MG/2ML IJ SOLN: 4.0000 mg | Freq: Four times a day (QID) | INTRAMUSCULAR | Status: DC | PRN

## 2019-08-10 MED ORDER — LABETALOL HCL 5 MG/ML IV SOLN
10.0000 mg | INTRAVENOUS | Status: DC | PRN
  Administered 2019-08-10: 10 mg via INTRAVENOUS

## 2019-08-10 SURGICAL SUPPLY — 31 items
BALLN ADMIRAL INPACT 5X250 (BALLOONS) ×3
BALLN COYOTE OTW 4X220X150 (BALLOONS) ×3
BALLN IN.PACT DCB 4X150 (BALLOONS) ×3
BALLOON ADMIRAL INPACT 5X250 (BALLOONS) ×2 IMPLANT
BALLOON COYOTE OTW 4X220X150 (BALLOONS) ×2 IMPLANT
BUR JETSTREAM XC 2.1/3.0 (BURR) ×2 IMPLANT
BURR JETSTREAM XC 2.1/3.0 (BURR) ×3
CATH OMNI FLUSH 5F 65CM (CATHETERS) ×3 IMPLANT
CATH QUICKCROSS .035X135CM (MICROCATHETER) ×3 IMPLANT
CATH QUICKCROSS ANG SELECT (CATHETERS) ×3 IMPLANT
CLOSURE MYNX CONTROL 6F/7F (Vascular Products) ×3 IMPLANT
DCB IN.PACT 4X150 (BALLOONS) ×2 IMPLANT
DEVICE EMBOSHIELD NAV6 2.5-4.8 (FILTER) ×3 IMPLANT
FILTER CO2 0.2 MICRON (VASCULAR PRODUCTS) ×3 IMPLANT
GUIDEWIRE ANGLED .035X260CM (WIRE) ×3 IMPLANT
KIT ENCORE 26 ADVANTAGE (KITS) ×3 IMPLANT
KIT MICROPUNCTURE NIT STIFF (SHEATH) ×3 IMPLANT
KIT PV (KITS) ×3 IMPLANT
LUBRICANT VIPERSLIDE CORONARY (MISCELLANEOUS) ×3 IMPLANT
RESERVOIR CO2 (VASCULAR PRODUCTS) ×3 IMPLANT
SET FLUSH CO2 (MISCELLANEOUS) ×3 IMPLANT
SHEATH FLEXOR ANSEL 1 7F 45CM (SHEATH) ×3 IMPLANT
SHEATH PINNACLE 5F 10CM (SHEATH) ×3 IMPLANT
SHEATH PINNACLE 7F 10CM (SHEATH) ×3 IMPLANT
SHEATH PROBE COVER 6X72 (BAG) ×3 IMPLANT
SYR MEDRAD MARK V 150ML (SYRINGE) ×3 IMPLANT
TRANSDUCER W/STOPCOCK (MISCELLANEOUS) ×3 IMPLANT
TRAY PV CATH (CUSTOM PROCEDURE TRAY) ×3 IMPLANT
WIRE BAREWIRE WORK .014X315CM (WIRE) ×3 IMPLANT
WIRE BENTSON .035X145CM (WIRE) ×6 IMPLANT
WIRE G V18X300CM (WIRE) ×3 IMPLANT

## 2019-08-10 NOTE — Plan of Care (Signed)
  Problem: Education: Goal: Knowledge of General Education information will improve Description: Including pain rating scale, medication(s)/side effects and non-pharmacologic comfort measures Outcome: Progressing   Problem: Pain Managment: Goal: General experience of comfort will improve Outcome: Progressing   Problem: Safety: Goal: Ability to remain free from injury will improve Outcome: Progressing   

## 2019-08-10 NOTE — Op Note (Signed)
Patient name: Natalie Contreras MRN: [id number] DOB: 04/22/1966 Sex: female  08/10/2019 Pre-operative Diagnosis: Critical right lower extremity ischemia Post-operative diagnosis:  Same Surgeon:  Erlene Quan C. Donzetta Matters, MD Procedure Performed: 1.  Ultrasound-guided cannulation left common femoral artery 2.  CO2 aortogram and bilateral lower extremity angiography 3.  Jetstream atherectomy right SFA and popliteal arteries with distal filter protection 4.  Drug-coated balloon angioplasty of popliteal artery with 4 x 150 and in.pact and SFA with 5 x 250 in.pact 5.  Mynx device closure left common femoral artery  6.  Moderate sedation with fentanyl and Versed for 109 minutes   Indications: 53 year old female with several month history of plantar ulcer on the right.  She now has lateral foot ulceration.  She is indicated for aortogram possible intervention of the right lower extremity.  Findings: CO2 aortogram demonstrated no aortic or iliac lesions.  Right side was site of interest.  There appeared to be multiple level stenosis of the SFA and popliteal.  She has two-vessel runoff via the anterior tibial and peroneal arteries.  After intervention where there were multiple greater than 70% stenosis there is now no residual stenosis no dissection in the SFA or popliteal.  Runoff is via the anterior tibial and peroneal arteries to the foot.  The anterior tibial artery initially appeared to have approximately 70% lesion but after intervening higher up appeared to only be 50% this was not intervened upon.  On the left side there were multiple stenoses in the SFA that do not appear to be flow-limiting.  She appears to have three-vessel runoff to the foot.   Procedure:  The patient was identified in the holding area and taken to room 8.  The patient was then placed supine on the table and prepped and draped in the usual sterile fashion.  A time out was called.  Ultrasound was used to evaluate the left common  femoral artery.  There was some disease there.  There is anesthetized 1% lidocaine cannulated with direct ultrasound visualization a micropuncture needle followed by wire and sheath.  Images saved the permanent record.  Bentson wires placed over 5 French sheath.  Moderate sedation with fentanyl and Versed was administered and her vital signs were monitored throughout the course.  On the catheter placed to level 1 and CO2 aortogram performed.  We pulled this the bifurcation perform bilateral lower extremity angiography.  We then crossed the bifurcation and use contrast to evaluate the SFA and popliteal arteries.  With this we placed a long 7 French sheath into the right common femoral artery from the left side patient was fully heparinized.  Glidewire quick cross catheter were used to cross the multiple stenoses down into the below-knee popliteal artery.  We confirmed intraluminal access.  We exchanged for a bare wire and deployed a filter.  We then proceeded with jetstream atherectomy of the entire SFA and popliteal segment.  We then ballooned this entire segment with 4 mm plain balloon.  There was no residual dissection.  We then used a 4 x 150 drug-coated balloon distally and a 5 x 250 drug-coated balloon proximally.  Completion demonstrated no residual stenosis no dissection.  We removed our filter was intact.  We attempted to cannulate the anterior tibial artery but could not.  The anterior tibial artery previously appear to have 70% stenosis now only 50% as flow improved.  With this we changed for short 7 French sheath in the left common femoral artery.  Minx device was deployed.  She tolerated procedure well immediate complication.   Contrast: 70cc  Marcelle Hepner C. Donzetta Matters, MD [facility] Office: [phone] Pager: [phone]

## 2019-08-10 NOTE — Progress Notes (Addendum)
Natalie Contreras  PROGRESS NOTE    TANZA VRANICH  [id number] DOB: 10-13-1965 DOA: 08/08/2019 PCP: Celene Squibb, MD   Brief Narrative:   53 yo F presenting with diabetic foot infection after failing outpt abx.   11/24: She is anxious about the possibility of surgery. Otherwise, denies complaints.    Assessment & Plan:   Principal Problem:   Abscess or cellulitis of foot Active Problems:   Benign essential HTN   Diabetic infection of right foot (HCC)   Tobacco abuse   PAD (peripheral artery disease) (HCC)   Chronic diastolic CHF (congestive heart failure) (HCC)   AKI (acute kidney injury) ([place])   Obesity (BMI 30-39.9)   Severe protein-calorie malnutrition ([place])   Gangrene of right foot ([place])  Rt Footwound infection/cellulitis/possible gangrene     - failed outpatient antibiotic treatment (doxy, keflex, bactrim)     - sole of Rt foot wound with final wound cultures from 06/13/2019 with pansensitive MSSA and abundant Finegoldia Magna      - Patient now has new right fibular side foot wound that looks necrotic/gangrenous     - vanc, cefepime     - ortho and vasc surg consulted; appreciate assistance     - MRI results noted     - 11/24: aortogram today w/ vasc surg; appreciate help; will await recs from ortho     - WOCN to see?  PAD     - continue simvastatin 40 mg and aspirin     - smoking cessation strongly advised      - ABI ordered, appreciate vasc surg assistance; further imaging per them  acute kidney injury on CKD stage IIIa      - worsening renal function apparently due to dehydration compounded by diuretic and antibiotic/Bactrim use         - SCr on admission 1.73 (baseline ~1.2); she is 1.43 today     - holding metformin and torsemide     - watch nephrotoxins     - 11/24: she is back up to 1.7 today; UOP is ok, getting a little fluids today; if not improved in AM, get renal US  DM2 uncontrolled     - recent A1c 10.7     - hold Metformin and Jardiance     - continue  Levemir 35 units qhs, Amaryl qAM; SSI     - 11/24: glucose is acceptable today, continue  Hypokalemia     - add K+, monitor  HTN     - continue Coreg 3.125 mg BID; PRN labetolol  Chronic HFpEF     - appears euvolemic, last known EF 55 to 60%     - continue coreg as above     - hold torsemide as above  Tobacco abuse      - counseled against further tobacco use     - nicotine patch  DVT prophylaxis: heparin Code Status: FULL   Disposition Plan: TBD  Consultants:   Vascular Surgery  Orthopedics  Antimicrobials:  . vanc cefepime   Subjective: "I'm starving."  Objective: Vitals:   08/10/19 1056 08/10/19 1101 08/10/19 1124 08/10/19 1533  BP: (!) 181/83  (!) 207/57 125/80  Pulse: 79 (!) 0 79   Resp: 20 (!) 9 12   Temp:    98.9 F (37.2 C)  TempSrc:    Oral  SpO2: (!) 0% (!) 0% 91% 97%  Weight:      Height:        Intake/Output Summary (  Last 24 hours) at 08/10/2019 1617 Last data filed at 08/10/2019 1600 Gross per 24 hour  Intake 710 ml  Output 1100 ml  Net -390 ml   Filed Weights   08/08/19 0824  Weight: 96.2 kg    Examination:  General: 53 y.o. female resting in bed in NAD Cardiovascular: RRR, +S1, S2, no m/g/ Respiratory: CTABL, no w/r/r GI: BS+, NDNT, no masses noted, no organomegaly noted MSK: No e/c/c, left toe amputations noted Neuro: A&O x 3, no focal deficits Psyc: calm/cooperative   Data Reviewed: I have personally reviewed following labs and imaging studies.  CBC: Recent Labs  Lab 08/08/19 0839 08/09/19 0713 08/10/19 0300  WBC 11.5* 8.3 9.8  NEUTROABS 9.2*  --  6.1  HGB 10.6* 10.2* 9.0*  HCT 34.5* 33.3* 29.5*  MCV 87.6 86.9 87.3  PLT 508* 453* 123456*   Basic Metabolic Panel: Recent Labs  Lab 08/08/19 0839 08/09/19 0713 08/10/19 0300  NA 136 140 134*  K 3.1* 3.5 3.4*  CL 95* 104 101  CO2 27 25 25   GLUCOSE 180* 131* 138*  BUN 37* 31* 33*  CREATININE 1.73* 1.43* 1.71*  CALCIUM 8.6* 8.5* 8.1*  MG  --   --  2.2  PHOS   --   --  3.2   GFR: Estimated Creatinine Clearance: 43.8 mL/min (A) (by C-G formula based on SCr of 1.71 mg/dL (H)). Liver Function Tests: Recent Labs  Lab 08/10/19 0300  ALBUMIN 1.8*   No results for input(s): LIPASE, AMYLASE in the last 168 hours. No results for input(s): AMMONIA in the last 168 hours. Coagulation Profile: No results for input(s): INR, PROTIME in the last 168 hours. Cardiac Enzymes: No results for input(s): CKTOTAL, CKMB, CKMBINDEX, TROPONINI in the last 168 hours. BNP (last 3 results) No results for input(s): PROBNP in the last 8760 hours. HbA1C: No results for input(s): HGBA1C in the last 72 hours. CBG: Recent Labs  Lab 08/09/19 1139 08/09/19 1648 08/09/19 2049 08/10/19 0631 08/10/19 1211  GLUCAP 99 147* 164* 127* 132*   Lipid Profile: No results for input(s): CHOL, HDL, LDLCALC, TRIG, CHOLHDL, LDLDIRECT in the last 72 hours. Thyroid Function Tests: No results for input(s): TSH, T4TOTAL, FREET4, T3FREE, THYROIDAB in the last 72 hours. Anemia Panel: No results for input(s): VITAMINB12, FOLATE, FERRITIN, TIBC, IRON, RETICCTPCT in the last 72 hours. Sepsis Labs: No results for input(s): PROCALCITON, LATICACIDVEN in the last 168 hours.  Recent Results (from the past 240 hour(s))  SARS CORONAVIRUS 2 (TAT 6-24 HRS) Nasopharyngeal Nasopharyngeal Swab     Status: None   Collection Time: 08/08/19 11:34 AM   Specimen: Nasopharyngeal Swab  Result Value Ref Range Status   SARS Coronavirus 2 NEGATIVE NEGATIVE Final    Comment: (NOTE) SARS-CoV-2 target nucleic acids are NOT DETECTED. The SARS-CoV-2 RNA is generally detectable in upper and lower respiratory specimens during the acute phase of infection. Negative results do not preclude SARS-CoV-2 infection, do not rule out co-infections with other pathogens, and should not be used as the sole basis for treatment or other patient management decisions. Negative results must be combined with clinical  observations, patient history, and epidemiological information. The expected result is Negative. Fact Sheet for Patients: SugarRoll.be Fact Sheet for Healthcare Providers: https://www.woods-mathews.com/ This test is not yet approved or cleared by the [place] FDA and  has been authorized for detection and/or diagnosis of SARS-CoV-2 by FDA under an Emergency Use Authorization (EUA). This EUA will remain  in effect (meaning this test can be  used) for the duration of the COVID-19 declaration under Section 56 4(b)(1) of the Act, 21 U.S.C. section 360bbb-3(b)(1), unless the authorization is terminated or revoked sooner. Performed at [facility], [place] [address] [place], [place] [address]       Radiology Studies: Mr Foot Right W Wo Contrast  Result Date: 08/09/2019 CLINICAL DATA:  Diabetes and foot swelling, possible osteomyelitis. Neuropathy. EXAM: MRI OF THE RIGHT FOREFOOT WITHOUT AND WITH CONTRAST TECHNIQUE: Multiplanar, multisequence MR imaging of the right forefoot was performed before and after the administration of intravenous contrast. CONTRAST:  24mL GADAVIST GADOBUTROL 1 MMOL/ML IV SOLN COMPARISON:  None. FINDINGS: Bones/Joint/Cartilage Degenerative arthropathy at the calcaneocuboid joint with subcortical marrow edema spurring as well as a small calcaneocuboid joint effusion. Ligaments The Lisfranc ligament appears intact. Muscles and Tendons There is atrophy and edema the abductor digiti minimi muscle and flexor digiti minimi brevis muscle likely related to cutaneous ulceration along the plantar foot and overlying lateral ulceration adjacent to base of the fifth metatarsal. No appreciable drainable abscess. Low-level edema tracks along the remaining plantar musculature of the foot. Soft tissues Cutaneous ulceration along the plantar foot below the base of the fifth metatarsal, and also lateral to the base of the fifth metatarsal.  IMPRESSION: 1. Cutaneous ulceration along the plantar foot and lateral to the base of the fifth metatarsal. No drainable abscess or osteomyelitis. 2. Atrophy and edema in the abductor digiti minimi and flexor digiti minimi brevis muscle, likely from myositis. 3. 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 Electronically Signed   By: Van Clines M.D.   On: 08/09/2019 09:56   Vas [place] Burnard Bunting With/wo Tbi  Result Date: 08/09/2019 LOWER EXTREMITY DOPPLER STUDY Indications: Rest pain, and peripheral artery disease. High Risk Factors: Hypertension, Diabetes.  Comparison Study: no prior Performing Technologist: Abram Sander RVS  Examination Guidelines: A complete evaluation includes at minimum, Doppler waveform signals and systolic blood pressure reading at the level of bilateral brachial, anterior tibial, and posterior tibial arteries, when vessel segments are accessible. Bilateral testing is considered an integral part of a complete examination. Photoelectric Plethysmograph (PPG) waveforms and toe systolic pressure readings are included as required and additional duplex testing as needed. Limited examinations for reoccurring indications may be performed as noted.  ABI Findings: +--------+------------------+-----+----------+-----------+ Right   Rt Pressure (mmHg)IndexWaveform  Comment     +--------+------------------+-----+----------+-----------+ [id number]                    triphasic             +--------+------------------+-----+----------+-----------+ PTA                                      not audible +--------+------------------+-----+----------+-----------+ DP      58                0.34 monophasic            +--------+------------------+-----+----------+-----------+ +--------+------------------+-----+---------+-------+ Left    Lt Pressure (mmHg)IndexWaveform Comment +--------+------------------+-----+---------+-------+ [id number]                     triphasic        +--------+------------------+-----+---------+-------+ PTA     105               0.62 biphasic         +--------+------------------+-----+---------+-------+ DP      107  0.63 biphasic         +--------+------------------+-----+---------+-------+ +-------+-----------+-----------+------------+------------+ ABI/TBIToday's ABIToday's TBIPrevious ABIPrevious TBI +-------+-----------+-----------+------------+------------+ Right  0.34                                           +-------+-----------+-----------+------------+------------+ Left   0.63                                           +-------+-----------+-----------+------------+------------+  Summary: Right: Resting right ankle-brachial index indicates severe right lower extremity arterial disease. Left: Resting left ankle-brachial index indicates moderate left lower extremity arterial disease.  *See table(s) above for measurements and observations.  Electronically signed by Curt Jews MD on 08/09/2019 at 1:34:25 PM.   Final      Scheduled Meds: . aspirin EC  81 mg Oral Q breakfast  . carvedilol  3.125 mg Oral BID WC  . gabapentin  300 mg Oral TID  . heparin  5,000 Units Subcutaneous Q8H  . influenza vac split quadrivalent PF  0.5 mL Intramuscular Tomorrow-1000  . insulin aspart  0-15 Units Subcutaneous TID WC  . insulin aspart  0-5 Units Subcutaneous QHS  . insulin aspart  3 Units Subcutaneous TID WC  . insulin detemir  35 Units Subcutaneous QHS  . nicotine  14 mg Transdermal Daily  . pneumococcal 23 valent vaccine  0.5 mL Intramuscular Tomorrow-1000  . polyethylene glycol  17 g Oral BID  . simvastatin  40 mg Oral Daily  . sodium chloride flush  3 mL Intravenous Q12H   Continuous Infusions: . sodium chloride    . sodium chloride 75 mL/hr (08/09/19 2229)  . sodium chloride 100 mL/hr at 08/10/19 1236  . ceFEPime (MAXIPIME) IV 2 g (08/09/19 2229)  . vancomycin       LOS: 2 days     Time spent: 25 minutes spent in the coordination of care today.    Jonnie Finner, DO [facility] Pager [phone]  If 7PM-7AM, please contact night-coverage [email or web address] Password [facility] 08/10/2019, 4:17 PM

## 2019-08-10 NOTE — Progress Notes (Signed)
Pharmacy Antibiotic Note  Natalie Contreras is a 53 y.o. female admitted on 08/08/2019 with R foot wound infection/cellulitis/possible gangrene.  Pharmacy has been consulted for vancomycin and cefepime dosing.  Patient is afebrile today, WBC 9.8. Scr bumped from 1.43 to 1.71. Will decrease vancomycin dose today and monitor renal function.  Plan: - Decrease vancomycin to 750 mg IV q24h (Est AUC: 482, Scr used: 1.71) - Continue cefepime 2 g q12h - Monitor Scr trend, c/s, and vanc levels as indicated  Height: 5\' 4"  (162.6 cm) Weight: 212 lb (96.2 kg) IBW/kg (Calculated) : 54.7  Temp (24hrs), Avg:98.9 F (37.2 C), Min:97.9 F (36.6 C), Max:99.8 F (37.7 C)  Recent Labs  Lab 08/08/19 0839 08/09/19 0713 08/10/19 0300  WBC 11.5* 8.3 9.8  CREATININE 1.73* 1.43* 1.71*    Estimated Creatinine Clearance: 43.8 mL/min (A) (by C-G formula based on SCr of 1.71 mg/dL (H)).    No Known Allergies  Antimicrobials this admission: Vanco 11/22 >>  Cefepime 11/22 >>   Microbiology results: 11/22 COVID: neg  Thank you for allowing pharmacy to be a part of this patient's care.  Gaylan Gerold 08/10/2019 8:40 AM

## 2019-08-10 NOTE — Progress Notes (Signed)
  Progress Note    08/10/2019 8:12 AM * No surgery date entered *  Subjective: No overnight issues  Vitals:   08/09/19 1934 08/10/19 0343  BP: (!) 122/56 (!) 106/47  Pulse: 77 63  Resp: 16 16  Temp: 99.8 F (37.7 C) 97.9 F (36.6 C)  SpO2: 91% (!) 89%    Physical Exam: Awake and alert Nonlabored respirations Right foot dressing clean dry intact with foul smell  CBC    Component Value Date/Time   WBC 9.8 08/10/2019 0300   RBC 3.38 (L) 08/10/2019 0300   HGB 9.0 (L) 08/10/2019 0300   HCT 29.5 (L) 08/10/2019 0300   PLT 411 (H) 08/10/2019 0300   MCV 87.3 08/10/2019 0300   MCH 26.6 08/10/2019 0300   MCHC 30.5 08/10/2019 0300   RDW 15.9 (H) 08/10/2019 0300   LYMPHSABS 2.6 08/10/2019 0300   MONOABS 0.7 08/10/2019 0300   EOSABS 0.3 08/10/2019 0300   BASOSABS 0.1 08/10/2019 0300    BMET    Component Value Date/Time   NA 134 (L) 08/10/2019 0300   K 3.4 (L) 08/10/2019 0300   CL 101 08/10/2019 0300   CO2 25 08/10/2019 0300   GLUCOSE 138 (H) 08/10/2019 0300   BUN 33 (H) 08/10/2019 0300   CREATININE 1.71 (H) 08/10/2019 0300   CREATININE 0.83 03/04/2017 1637   CALCIUM 8.1 (L) 08/10/2019 0300   GFRNONAA 34 (L) 08/10/2019 0300   GFRAA 39 (L) 08/10/2019 0300    INR No results found for: INR   Intake/Output Summary (Last 24 hours) at 08/10/2019 [id number] Last data filed at 08/10/2019 0300 Gross per 24 hour  Intake 1226.97 ml  Output 1100 ml  Net 126.97 ml     Assessment/plan:  53 y.o. female is here with extensive right foot wounds.  I discussed case with patient and Dr. Sharol Given.  She will need debridement following revascularization.  I discussed with her possible outcomes of endovascular versus the need for open intervention which would need to be done expeditiously.  She demonstrates good understanding we will plan for angiogram possible intervention right lower extremity today.    Jaiyanna Safran C. Donzetta Matters, MD [facility] Office:  [phone] Pager: [phone]  08/10/2019 8:12 AM

## 2019-08-10 NOTE — Progress Notes (Signed)
Pt arrived to 4e from cath lab. Pt oriented to room and staff. Vitals obtained. CHG bath completed. Will continue current plan of care.

## 2019-08-11 ENCOUNTER — Encounter (HOSPITAL_COMMUNITY): Payer: Self-pay | Admitting: Vascular Surgery

## 2019-08-11 ENCOUNTER — Inpatient Hospital Stay (HOSPITAL_COMMUNITY): Payer: Medicaid Other | Admitting: Anesthesiology

## 2019-08-11 ENCOUNTER — Encounter (HOSPITAL_COMMUNITY)
Admission: EM | Disposition: A | Discharge status: Home Health | Payer: Self-pay | Source: Home / Self Care | Attending: Internal Medicine

## 2019-08-11 HISTORY — PX: I & D EXTREMITY: SHX5045

## 2019-08-11 LAB — CBC WITH DIFFERENTIAL/PLATELET
Abs Immature Granulocytes: 0.07 10*3/uL (ref 0.00–0.07)
Basophils Absolute: 0 10*3/uL (ref 0.0–0.1)
Basophils Relative: 0 %
Eosinophils Absolute: 0.4 10*3/uL (ref 0.0–0.5)
Eosinophils Relative: 4 %
HCT: 29.8 % — ABNORMAL LOW (ref 36.0–46.0)
Hemoglobin: 9.3 g/dL — ABNORMAL LOW (ref 12.0–15.0)
Immature Granulocytes: 1 %
Lymphocytes Relative: 13 %
Lymphs Abs: 1.3 10*3/uL (ref 0.7–4.0)
MCH: 26.9 pg (ref 26.0–34.0)
MCHC: 31.2 g/dL (ref 30.0–36.0)
MCV: 86.1 fL (ref 80.0–100.0)
Monocytes Absolute: 0.5 10*3/uL (ref 0.1–1.0)
Monocytes Relative: 5 %
Neutro Abs: 8 10*3/uL — ABNORMAL HIGH (ref 1.7–7.7)
Neutrophils Relative %: 77 %
Platelets: 380 10*3/uL (ref 150–400)
RBC: 3.46 MIL/uL — ABNORMAL LOW (ref 3.87–5.11)
RDW: 15.7 % — ABNORMAL HIGH (ref 11.5–15.5)
WBC: 10.2 10*3/uL (ref 4.0–10.5)
nRBC: 0 % (ref 0.0–0.2)

## 2019-08-11 LAB — GLUCOSE, CAPILLARY
Glucose-Capillary: 193 mg/dL — ABNORMAL HIGH (ref 70–99)
Glucose-Capillary: 132 mg/dL — ABNORMAL HIGH (ref 70–99)
Glucose-Capillary: 116 mg/dL — ABNORMAL HIGH (ref 70–99)
Glucose-Capillary: 219 mg/dL — ABNORMAL HIGH (ref 70–99)
Glucose-Capillary: 177 mg/dL — ABNORMAL HIGH (ref 70–99)

## 2019-08-11 LAB — SURGICAL PCR SCREEN
MRSA, PCR: NEGATIVE
Staphylococcus aureus: NEGATIVE

## 2019-08-11 LAB — RENAL FUNCTION PANEL
Albumin: 1.7 g/dL — ABNORMAL LOW (ref 3.5–5.0)
Anion gap: 9 (ref 5–15)
BUN: 32 mg/dL — ABNORMAL HIGH (ref 6–20)
CO2: 23 mmol/L (ref 22–32)
Calcium: 8.1 mg/dL — ABNORMAL LOW (ref 8.9–10.3)
Chloride: 101 mmol/L (ref 98–111)
Creatinine, Ser: 1.57 mg/dL — ABNORMAL HIGH (ref 0.44–1.00)
GFR calc Af Amer: 44 mL/min — ABNORMAL LOW (ref >60)
GFR calc non Af Amer: 38 mL/min — ABNORMAL LOW (ref >60)
Glucose, Bld: 236 mg/dL — ABNORMAL HIGH (ref 70–99)
Phosphorus: 2.6 mg/dL (ref 2.5–4.6)
Potassium: 4.1 mmol/L (ref 3.5–5.1)
Sodium: 133 mmol/L — ABNORMAL LOW (ref 135–145)

## 2019-08-11 LAB — MAGNESIUM: Magnesium: 2.1 mg/dL (ref 1.7–2.4)

## 2019-08-11 SURGERY — IRRIGATION AND DEBRIDEMENT EXTREMITY
Anesthesia: General | Site: Foot | Laterality: Right

## 2019-08-11 MED ORDER — OXYCODONE HCL 5 MG PO TABS: 5.0000 mg | ORAL_TABLET | Freq: Once | ORAL | Status: DC | PRN

## 2019-08-11 MED ORDER — DEXAMETHASONE SODIUM PHOSPHATE 10 MG/ML IJ SOLN
INTRAMUSCULAR | Status: DC | PRN
  Administered 2019-08-11: 4 mg via INTRAVENOUS

## 2019-08-11 MED ORDER — HYDROMORPHONE HCL 1 MG/ML IJ SOLN
0.5000 mg | INTRAMUSCULAR | Status: DC | PRN
  Administered 2019-08-11 – 2019-08-12 (×6): 0.5 mg via INTRAVENOUS
  Filled 2019-08-11 (×7): qty 1

## 2019-08-11 MED ORDER — MIDAZOLAM HCL 2 MG/2ML IJ SOLN
INTRAMUSCULAR | Status: AC
  Filled 2019-08-11: qty 2

## 2019-08-11 MED ORDER — METOCLOPRAMIDE HCL 5 MG PO TABS: 5.0000 mg | ORAL_TABLET | Freq: Three times a day (TID) | ORAL | Status: DC | PRN

## 2019-08-11 MED ORDER — LIDOCAINE 2% (20 MG/ML) 5 ML SYRINGE
INTRAMUSCULAR | Status: DC | PRN
  Administered 2019-08-11: 100 mg via INTRAVENOUS

## 2019-08-11 MED ORDER — ONDANSETRON HCL 4 MG/2ML IJ SOLN
INTRAMUSCULAR | Status: AC
  Filled 2019-08-11: qty 2

## 2019-08-11 MED ORDER — ENSURE PRE-SURGERY PO LIQD
296.0000 mL | Freq: Once | ORAL | Status: AC
  Administered 2019-08-11: 296 mL via ORAL
  Filled 2019-08-11: qty 296

## 2019-08-11 MED ORDER — CEFAZOLIN SODIUM-DEXTROSE 2-4 GM/100ML-% IV SOLN
2.0000 g | INTRAVENOUS | Status: AC
  Administered 2019-08-11: 2 g via INTRAVENOUS
  Filled 2019-08-11: qty 100

## 2019-08-11 MED ORDER — METOCLOPRAMIDE HCL 5 MG/ML IJ SOLN: 5.0000 mg | Freq: Three times a day (TID) | INTRAMUSCULAR | Status: DC | PRN

## 2019-08-11 MED ORDER — DEXAMETHASONE SODIUM PHOSPHATE 10 MG/ML IJ SOLN
INTRAMUSCULAR | Status: AC
  Filled 2019-08-11: qty 1

## 2019-08-11 MED ORDER — PROPOFOL 10 MG/ML IV BOLUS
INTRAVENOUS | Status: AC
  Filled 2019-08-11: qty 40

## 2019-08-11 MED ORDER — EPHEDRINE SULFATE-NACL 50-0.9 MG/10ML-% IV SOSY
PREFILLED_SYRINGE | INTRAVENOUS | Status: DC | PRN
  Administered 2019-08-11: 10 mg via INTRAVENOUS
  Administered 2019-08-11: 15 mg via INTRAVENOUS

## 2019-08-11 MED ORDER — CHLORHEXIDINE GLUCONATE 4 % EX LIQD
60.0000 mL | Freq: Once | CUTANEOUS | Status: AC
  Administered 2019-08-11: 4 via TOPICAL
  Filled 2019-08-11: qty 60

## 2019-08-11 MED ORDER — ACETAMINOPHEN 500 MG PO TABS
ORAL_TABLET | ORAL | Status: AC
  Filled 2019-08-11: qty 2

## 2019-08-11 MED ORDER — FENTANYL CITRATE (PF) 100 MCG/2ML IJ SOLN
25.0000 ug | INTRAMUSCULAR | Status: DC | PRN
  Administered 2019-08-11: 25 ug via INTRAVENOUS
  Administered 2019-08-11: 11:00:00 50 ug via INTRAVENOUS

## 2019-08-11 MED ORDER — PROPOFOL 10 MG/ML IV BOLUS
INTRAVENOUS | Status: DC | PRN
  Administered 2019-08-11: 150 mg via INTRAVENOUS

## 2019-08-11 MED ORDER — SODIUM CHLORIDE 0.9 % IV SOLN
INTRAVENOUS | Status: AC
  Administered 2019-08-11 (×2): via INTRAVENOUS

## 2019-08-11 MED ORDER — OXYCODONE HCL 5 MG/5ML PO SOLN: 5.0000 mg | Freq: Once | ORAL | Status: DC | PRN

## 2019-08-11 MED ORDER — MIDAZOLAM HCL 5 MG/5ML IJ SOLN
INTRAMUSCULAR | Status: DC | PRN
  Administered 2019-08-11: 2 mg via INTRAVENOUS

## 2019-08-11 MED ORDER — ONDANSETRON HCL 4 MG/2ML IJ SOLN
4.0000 mg | Freq: Four times a day (QID) | INTRAMUSCULAR | Status: DC | PRN
  Administered 2019-08-13: 4 mg via INTRAVENOUS
  Filled 2019-08-11 (×2): qty 2

## 2019-08-11 MED ORDER — LACTATED RINGERS IV SOLN
INTRAVENOUS | Status: DC
  Administered 2019-08-11: 09:00:00 via INTRAVENOUS

## 2019-08-11 MED ORDER — FENTANYL CITRATE (PF) 250 MCG/5ML IJ SOLN
INTRAMUSCULAR | Status: AC
  Filled 2019-08-11: qty 5

## 2019-08-11 MED ORDER — SODIUM CHLORIDE 0.9 % IR SOLN
Status: DC | PRN
  Administered 2019-08-11: 1000 mL

## 2019-08-11 MED ORDER — ONDANSETRON HCL 4 MG PO TABS: 4.0000 mg | ORAL_TABLET | Freq: Four times a day (QID) | ORAL | Status: DC | PRN

## 2019-08-11 MED ORDER — FENTANYL CITRATE (PF) 100 MCG/2ML IJ SOLN
INTRAMUSCULAR | Status: AC
  Filled 2019-08-11: qty 2

## 2019-08-11 MED ORDER — LIDOCAINE 2% (20 MG/ML) 5 ML SYRINGE
INTRAMUSCULAR | Status: AC
  Filled 2019-08-11: qty 5

## 2019-08-11 MED ORDER — PROMETHAZINE HCL 25 MG/ML IJ SOLN: 6.2500 mg | INTRAMUSCULAR | Status: DC | PRN

## 2019-08-11 MED ORDER — POVIDONE-IODINE 10 % EX SWAB: 2.0000 "application " | Freq: Once | CUTANEOUS | Status: DC

## 2019-08-11 MED ORDER — ACETAMINOPHEN 500 MG PO TABS
1000.0000 mg | ORAL_TABLET | Freq: Once | ORAL | Status: AC
  Administered 2019-08-11: 1000 mg via ORAL

## 2019-08-11 MED ORDER — DOCUSATE SODIUM 100 MG PO CAPS
100.0000 mg | ORAL_CAPSULE | Freq: Two times a day (BID) | ORAL | Status: DC
  Administered 2019-08-11 – 2019-08-14 (×5): 100 mg via ORAL
  Filled 2019-08-11 (×7): qty 1

## 2019-08-11 SURGICAL SUPPLY — 39 items
ALLOGRAFT SKIN MESHD 387 SQ CM (Graft) ×1 IMPLANT
BENZOIN TINCTURE PRP APPL 2/3 (GAUZE/BANDAGES/DRESSINGS) ×3 IMPLANT
BLADE SURG 21 STRL SS (BLADE) ×2 IMPLANT
BNDG COHESIVE 6X5 TAN STRL LF (GAUZE/BANDAGES/DRESSINGS) ×1 IMPLANT
BNDG GAUZE ELAST 4 BULKY (GAUZE/BANDAGES/DRESSINGS) ×4 IMPLANT
COVER SURGICAL LIGHT HANDLE (MISCELLANEOUS) ×3 IMPLANT
COVER WAND RF STERILE (DRAPES) ×2 IMPLANT
DRAPE U-SHAPE 47X51 STRL (DRAPES) ×2 IMPLANT
DRSG ADAPTIC 3X8 NADH LF (GAUZE/BANDAGES/DRESSINGS) ×2 IMPLANT
DRSG CLEANSE VERAFLOW (GAUZE/BANDAGES/DRESSINGS) ×1 IMPLANT
DURAPREP 26ML APPLICATOR (WOUND CARE) ×2 IMPLANT
ELECT REM PT RETURN 9FT ADLT (ELECTROSURGICAL)
ELECTRODE REM PT RTRN 9FT ADLT (ELECTROSURGICAL) IMPLANT
GAUZE SPONGE 4X4 12PLY STRL (GAUZE/BANDAGES/DRESSINGS) ×2 IMPLANT
GLOVE BIOGEL PI IND STRL 9 (GLOVE) ×1 IMPLANT
GLOVE BIOGEL PI INDICATOR 9 (GLOVE) ×1
GLOVE SURG ORTHO 9.0 STRL STRW (GLOVE) ×2 IMPLANT
GOWN STRL REUS W/ TWL XL LVL3 (GOWN DISPOSABLE) ×2 IMPLANT
GOWN STRL REUS W/TWL XL LVL3 (GOWN DISPOSABLE) ×2
GRAFT TISS MESH 387 BURN (Graft) IMPLANT
HANDPIECE INTERPULSE COAX TIP (DISPOSABLE)
KIT BASIN OR (CUSTOM PROCEDURE TRAY) ×2 IMPLANT
KIT TURNOVER KIT B (KITS) ×2 IMPLANT
MANIFOLD NEPTUNE II (INSTRUMENTS) ×2 IMPLANT
MICROMATRIX 1000MG (Tissue) ×2 IMPLANT
NS IRRIG 1000ML POUR BTL (IV SOLUTION) ×2 IMPLANT
PACK ORTHO EXTREMITY (CUSTOM PROCEDURE TRAY) ×2 IMPLANT
PAD ARMBOARD 7.5X6 YLW CONV (MISCELLANEOUS) ×4 IMPLANT
SET HNDPC FAN SPRY TIP SCT (DISPOSABLE) IMPLANT
SKIN MESHED 387 SQ CM (Graft) ×2 IMPLANT
SOLUTION PARTIC MCRMTRX 1000MG (Tissue) IMPLANT
STOCKINETTE IMPERVIOUS 9X36 MD (GAUZE/BANDAGES/DRESSINGS) IMPLANT
SWAB COLLECTION DEVICE MRSA (MISCELLANEOUS) ×2 IMPLANT
SWAB CULTURE ESWAB REG 1ML (MISCELLANEOUS) IMPLANT
SWAB CULTURE LIQ STUART DBL (MISCELLANEOUS) ×1 IMPLANT
SWAB CULTURE LIQUID MINI MALE (MISCELLANEOUS) ×1 IMPLANT
TOWEL GREEN STERILE (TOWEL DISPOSABLE) ×2 IMPLANT
TUBE CONNECTING 12X1/4 (SUCTIONS) ×2 IMPLANT
YANKAUER SUCT BULB TIP NO VENT (SUCTIONS) ×2 IMPLANT

## 2019-08-11 NOTE — Progress Notes (Signed)
PROGRESS NOTE    Natalie Contreras  [id number] DOB: 07-31-1966 DOA: 08/08/2019 PCP: Celene Squibb, MD     Brief Narrative:  Natalie Contreras is a 53 year old female with past medical history significant for chronic diastolic heart failure, type 2 diabetes, hypertension, peripheral artery disease with recurrent lower extremity foot infections and tobacco abuse who presents to the emergency department on 11/22 with worsening right foot wounds.  She underwent angiography with angioplasty by vascular surgery 11/24.  New events last 24 hours / Subjective: Patient seen eating lunch after her procedure with Dr. Sharol Given this morning.  Complains of some pain in her right foot, received pain medication about 5 minutes ago.  Assessment & Plan:   Principal Problem:   Abscess or cellulitis of foot Active Problems:   Benign essential HTN   Diabetic infection of right foot (HCC)   Tobacco abuse   PAD (peripheral artery disease) (HCC)   Chronic diastolic CHF (congestive heart failure) (HCC)   AKI (acute kidney injury) ([place])   Subacute osteomyelitis, right ankle and foot (HCC)   Obesity (BMI 30-39.9)   Severe protein-calorie malnutrition ([place])   Gangrene of right foot ([place])   Right foot osteomyelitis -Failed outpatient antibiotic treatment (doxy, keflex, bactrim) -Sole of right foot wound with final wound cultures from 06/13/2019 with pansensitive MSSA and abundant Finegoldia Magna  -Status post angioplasty by vascular surgery 11/24 -Irrigation and debridement by orthopedic surgery 11/25 -Recommended 6 weeks antibiotics based on tissue cultures obtained intraoperatively, strict nonweightbearing right lower extremity, follow-up in office in 1 week with Dr. Sharol Given -Continue cefepime, vancomycin  PAD -Status post angioplasty -Continue aspirin, Plavix, Zocor  Acute kidney injury on CKD stage IIIa  -Cr on admission 1.73 (baseline ~1.2) -Holding metformin and torsemide -Improving -IVF   DM2  uncontrolled with hyperglycemia -Recent A1c 10.7 -Hold Metformin and Jardiance -Continue Levemir, sliding scale insulin  HTN -Continue Coreg   Chronic diastolic heart failure -Stable  Tobacco abuse  -Counseled against further tobacco use -Nicotine patch   DVT prophylaxis: Subcutaneous heparin Code Status: Full code Family Communication: At bedside Disposition Plan: Pending postop recommendations, hopeful discharge home soon    Antimicrobials:  Anti-infectives (From admission, onward)   Start     Dose/Rate Route Frequency Ordered Stop   08/11/19 0800  ceFAZolin (ANCEF) IVPB 2g/100 mL premix     2 g 200 mL/hr over 30 Minutes Intravenous To ShortStay Surgical 08/11/19 0317 08/11/19 1015   08/10/19 1100  vancomycin (VANCOCIN) IVPB 750 mg/150 ml premix     750 mg 150 mL/hr over 60 Minutes Intravenous Every 24 hours 08/10/19 1022     08/09/19 1100  vancomycin (VANCOCIN) 1,250 mg in sodium chloride 0.9 % 250 mL IVPB  Status:  Discontinued     1,250 mg 166.7 mL/hr over 90 Minutes Intravenous Every 24 hours 08/08/19 1355 08/09/19 0846   08/09/19 1100  vancomycin (VANCOCIN) IVPB 1000 mg/200 mL premix  Status:  Discontinued     1,000 mg 200 mL/hr over 60 Minutes Intravenous Every 24 hours 08/09/19 0846 08/10/19 1022   08/08/19 2200  ceFEPIme (MAXIPIME) 2 g in sodium chloride 0.9 % 100 mL IVPB     2 g 200 mL/hr over 30 Minutes Intravenous Every 12 hours 08/08/19 1230     08/08/19 1200  vancomycin (VANCOCIN) IVPB 1000 mg/200 mL premix     1,000 mg 200 mL/hr over 60 Minutes Intravenous  Once 08/08/19 1152 08/08/19 1405   08/08/19 1045  ceFEPIme (MAXIPIME) 2 g  in sodium chloride 0.9 % 100 mL IVPB     2 g 200 mL/hr over 30 Minutes Intravenous  Once 08/08/19 1035 08/08/19 1145   08/08/19 0845  vancomycin (VANCOCIN) IVPB 1000 mg/200 mL premix     1,000 mg 200 mL/hr over 60 Minutes Intravenous  Once 08/08/19 0838 08/08/19 1032        Objective: Vitals:   08/11/19 0400  08/11/19 1114 08/11/19 1129 08/11/19 1152  BP: (!) 157/63 (!) 137/56 (!) 157/55 (!) 155/63  Pulse: 78 65 65 67  Resp: 17 15 13 15   Temp: 98 F (36.7 C) 98 F (36.7 C)  97.6 F (36.4 C)  TempSrc: Oral   Oral  SpO2: 97% 97% 97% 100%  Weight:      Height:        Intake/Output Summary (Last 24 hours) at 08/11/2019 1317 Last data filed at 08/11/2019 1109 Gross per 24 hour  Intake 1200 ml  Output 30 ml  Net 1170 ml   Filed Weights   08/08/19 0824  Weight: 96.2 kg    Examination:  General exam: Appears calm and comfortable  Respiratory system: Clear to auscultation. Respiratory effort normal. No respiratory distress. No conversational dyspnea.  Cardiovascular system: S1 & S2 heard, RRR. No murmurs. No pedal edema. Gastrointestinal system: Abdomen is nondistended, soft and nontender. Normal bowel sounds heard. Central nervous system: Alert and oriented. No focal neurological deficits. Speech clear.  Extremities: Right foot in surgical dressing, wound VAC in place Psychiatry: Judgement and insight appear normal. Mood & affect appropriate.   Data Reviewed: I have personally reviewed following labs and imaging studies  CBC: Recent Labs  Lab 08/08/19 0839 08/09/19 0713 08/10/19 0300 08/11/19 0522  WBC 11.5* 8.3 9.8 10.2  NEUTROABS 9.2*  --  6.1 8.0*  HGB 10.6* 10.2* 9.0* 9.3*  HCT 34.5* 33.3* 29.5* 29.8*  MCV 87.6 86.9 87.3 86.1  PLT 508* 453* 411* [id number]   Basic Metabolic Panel: Recent Labs  Lab 08/08/19 0839 08/09/19 0713 08/10/19 0300 08/11/19 0522  NA 136 140 134* 133*  K 3.1* 3.5 3.4* 4.1  CL 95* 104 101 101  CO2 27 25 25 23   GLUCOSE 180* 131* 138* 236*  BUN 37* 31* 33* 32*  CREATININE 1.73* 1.43* 1.71* 1.57*  CALCIUM 8.6* 8.5* 8.1* 8.1*  MG  --   --  2.2 2.1  PHOS  --   --  3.2 2.6   GFR: Estimated Creatinine Clearance: 47.7 mL/min (A) (by C-G formula based on SCr of 1.57 mg/dL (H)). Liver Function Tests: Recent Labs  Lab 08/10/19 0300 08/11/19 0522   ALBUMIN 1.8* 1.7*   No results for input(s): LIPASE, AMYLASE in the last 168 hours. No results for input(s): AMMONIA in the last 168 hours. Coagulation Profile: No results for input(s): INR, PROTIME in the last 168 hours. Cardiac Enzymes: No results for input(s): CKTOTAL, CKMB, CKMBINDEX, TROPONINI in the last 168 hours. BNP (last 3 results) No results for input(s): PROBNP in the last 8760 hours. HbA1C: No results for input(s): HGBA1C in the last 72 hours. CBG: Recent Labs  Lab 08/10/19 1619 08/10/19 2159 08/11/19 0629 08/11/19 1115 08/11/19 1147  GLUCAP 118* 156* 193* 132* 116*   Lipid Profile: No results for input(s): CHOL, HDL, LDLCALC, TRIG, CHOLHDL, LDLDIRECT in the last 72 hours. Thyroid Function Tests: No results for input(s): TSH, T4TOTAL, FREET4, T3FREE, THYROIDAB in the last 72 hours. Anemia Panel: No results for input(s): VITAMINB12, FOLATE, FERRITIN, TIBC, IRON, RETICCTPCT in the last  72 hours. Sepsis Labs: No results for input(s): PROCALCITON, LATICACIDVEN in the last 168 hours.  Recent Results (from the past 240 hour(s))  SARS CORONAVIRUS 2 (TAT 6-24 HRS) Nasopharyngeal Nasopharyngeal Swab     Status: None   Collection Time: 08/08/19 11:34 AM   Specimen: Nasopharyngeal Swab  Result Value Ref Range Status   SARS Coronavirus 2 NEGATIVE NEGATIVE Final    Comment: (NOTE) SARS-CoV-2 target nucleic acids are NOT DETECTED. The SARS-CoV-2 RNA is generally detectable in upper and lower respiratory specimens during the acute phase of infection. Negative results do not preclude SARS-CoV-2 infection, do not rule out co-infections with other pathogens, and should not be used as the sole basis for treatment or other patient management decisions. Negative results must be combined with clinical observations, patient history, and epidemiological information. The expected result is Negative. Fact Sheet for Patients: SugarRoll.be Fact Sheet  for Healthcare Providers: https://www.woods-mathews.com/ This test is not yet approved or cleared by the [place] FDA and  has been authorized for detection and/or diagnosis of SARS-CoV-2 by FDA under an Emergency Use Authorization (EUA). This EUA will remain  in effect (meaning this test can be used) for the duration of the COVID-19 declaration under Section 56 4(b)(1) of the Act, 21 U.S.C. section 360bbb-3(b)(1), unless the authorization is terminated or revoked sooner. Performed at [facility], [place] [address], [place], [place] [address]   Surgical pcr screen     Status: None   Collection Time: 08/11/19  4:04 AM   Specimen: Nasal Mucosa; Nasal Swab  Result Value Ref Range Status   MRSA, PCR NEGATIVE NEGATIVE Final   Staphylococcus aureus NEGATIVE NEGATIVE Final    Comment: (NOTE) The Xpert SA Assay (FDA approved for NASAL specimens in patients 104 years of age and older), is one component of a comprehensive surveillance program. It is not intended to diagnose infection nor to guide or monitor treatment. Performed at [facility], [place] [address] [place], [place] [address]       Radiology Studies: No results found.    Scheduled Meds: . aspirin EC  81 mg Oral Q breakfast  . carvedilol  3.125 mg Oral BID WC  . clopidogrel  300 mg Oral Once   Followed by  . clopidogrel  75 mg Oral Q breakfast  . docusate sodium  100 mg Oral BID  . gabapentin  300 mg Oral TID  . heparin  5,000 Units Subcutaneous Q8H  . influenza vac split quadrivalent PF  0.5 mL Intramuscular Tomorrow-1000  . insulin aspart  0-15 Units Subcutaneous TID WC  . insulin aspart  0-5 Units Subcutaneous QHS  . insulin aspart  3 Units Subcutaneous TID WC  . insulin detemir  35 Units Subcutaneous QHS  . nicotine  14 mg Transdermal Daily  . pneumococcal 23 valent vaccine  0.5 mL Intramuscular Tomorrow-1000  . polyethylene glycol  17 g Oral BID  . potassium chloride  40 mEq Oral Daily   . simvastatin  40 mg Oral Daily  . sodium chloride flush  3 mL Intravenous Q12H  . sodium chloride flush  3 mL Intravenous Q12H   Continuous Infusions: . sodium chloride    . sodium chloride 75 mL/hr (08/09/19 2229)  . sodium chloride    . ceFEPime (MAXIPIME) IV 0 mL/hr at 08/10/19 2227  . lactated ringers 10 mL/hr at 08/11/19 0841  . vancomycin       LOS: 3 days      Time spent: 39  minutes   Dessa Phi, DO [facility] 08/11/2019, 1:17 PM   Available via Epic secure chat 7am-7pm After these hours, please refer to coverage provider listed on [email or web address]

## 2019-08-11 NOTE — Interval H&P Note (Signed)
History and Physical Interval Note:  08/11/2019 7:11 AM  [name]  has presented today for surgery, with the diagnosis of R foot ulcer.  The various methods of treatment have been discussed with the patient and family. After consideration of risks, benefits and other options for treatment, the patient has consented to  Procedure(s): IRRIGATION AND DEBRIDEMENT EXTREMITY (Right) as a surgical intervention.  The patient's history has been reviewed, patient examined, no change in status, stable for surgery.  I have reviewed the patient's chart and labs.  Questions were answered to the patient's satisfaction.     Natalie Contreras

## 2019-08-11 NOTE — Anesthesia Procedure Notes (Signed)
Procedure Name: LMA Insertion Date/Time: 08/11/2019 10:25 AM Performed by: Moshe Salisbury, CRNA Pre-anesthesia Checklist: Patient identified, Emergency Drugs available, Suction available and Patient being monitored Patient Re-evaluated:Patient Re-evaluated prior to induction Oxygen Delivery Method: Circle System Utilized Preoxygenation: Pre-oxygenation with 100% oxygen Induction Type: IV induction Ventilation: Mask ventilation without difficulty LMA: LMA inserted LMA Size: 4.0 Number of attempts: 1 Placement Confirmation: positive ETCO2 Tube secured with: Tape Dental Injury: Teeth and Oropharynx as per pre-operative assessment

## 2019-08-11 NOTE — Progress Notes (Addendum)
  Progress Note    08/11/2019 7:17 AM 1 Day Post-Op  Subjective:  Says her right foot feels better since procedure  Tm 99.4 now afebrile   Vitals:   08/10/19 1932 08/11/19 0400  BP: (!) 151/60 (!) 157/63  Pulse: 83 78  Resp: 18 17  Temp: 99.4 F (37.4 C) 98 F (36.7 C)  SpO2: 91% 97%    Physical Exam: Cardiac:  regular Lungs:  Non labored Incisions:  Left groin soft without hematoma Extremities:  Brisk right AT doppler signal with faint right peroneal doppler signal; lateral right foot wound and malodorous    CBC    Component Value Date/Time   WBC 10.2 08/11/2019 0522   RBC 3.46 (L) 08/11/2019 0522   HGB 9.3 (L) 08/11/2019 0522   HCT 29.8 (L) 08/11/2019 0522   PLT 380 08/11/2019 0522   MCV 86.1 08/11/2019 0522   MCH 26.9 08/11/2019 0522   MCHC 31.2 08/11/2019 0522   RDW 15.7 (H) 08/11/2019 0522   LYMPHSABS 1.3 08/11/2019 0522   MONOABS 0.5 08/11/2019 0522   EOSABS 0.4 08/11/2019 0522   BASOSABS 0.0 08/11/2019 0522    BMET    Component Value Date/Time   NA 133 (L) 08/11/2019 0522   K 4.1 08/11/2019 0522   CL 101 08/11/2019 0522   CO2 23 08/11/2019 0522   GLUCOSE 236 (H) 08/11/2019 0522   BUN 32 (H) 08/11/2019 0522   CREATININE 1.57 (H) 08/11/2019 0522   CREATININE 0.83 03/04/2017 1637   CALCIUM 8.1 (L) 08/11/2019 0522   GFRNONAA 38 (L) 08/11/2019 0522   GFRAA 44 (L) 08/11/2019 0522    INR No results found for: INR   Intake/Output Summary (Last 24 hours) at 08/11/2019 0717 Last data filed at 08/11/2019 0400 Gross per 24 hour  Intake 700 ml  Output -  Net 700 ml     Assessment:  53 y.o. female is s/p:  1.  Ultrasound-guided cannulation left common femoral artery 2.  CO2 aortogram and bilateral lower extremity angiography 3.  Jetstream atherectomy right SFA and popliteal arteries with distal filter protection 4.  Drug-coated balloon angioplasty of popliteal artery with 4 x 150 and in.pact and SFA with 5 x 250 in.pact 5.  Mynx device  closure left common femoral artery   6.  Moderate sedation with fentanyl and Versed for 109 minutes  1 Day Post-Op  Plan: -brisk right AT doppler signal with faint peroneal signal -pt for OR today with Dr. Sharol Given for debridement. -left groin without hematoma   Leontine Locket, PA-C [facility] [phone] 08/11/2019 7:17 AM  I have independently interviewed patient and agree with PA assessment and plan above.  Patient foot feeling much better today.  She has undergone debridement with Dr. Sharol Given.  We will hope for healing.  I will schedule follow-up with our office in 6 weeks for duplex and ABIs.  If she is still here on Monday I will see her again otherwise please contact vascular surgeon on call if there are issues.  Meral Geissinger C. Donzetta Matters, MD [facility] [place] Office: [phone] Pager: [phone]

## 2019-08-11 NOTE — Consult Note (Addendum)
WOC consult requested for right foot.  Dr Sharol Given of the ortho service is following for assessment and plan of care and plans to take the patient to the OR.  Please refer to his team for further questions regarding plan of care. Please re-consult if further assistance is needed.  Thank-you,  Julien Girt MSN, [place], [place], [place], [place]

## 2019-08-11 NOTE — Op Note (Signed)
08/11/2019  11:22 AM  PATIENT:  Natalie Contreras    PRE-OPERATIVE DIAGNOSIS:  R foot ulcer  POST-OPERATIVE DIAGNOSIS: Necrotic ulcer right foot with osteomyelitis involving the fifth metatarsal cuboid and calcaneus.  PROCEDURE:  IRRIGATION AND DEBRIDEMENT EXTREMITY Partial excision fifth metatarsal cuboid and calcaneus. Local tissue rearrangement for wound closure 9 x 15 cm. Application of [id number] cm of split-thickness skin graft allograft and application of 1 g ACell powder. Application of cleanse choice wound VAC.  SURGEON:  Newt Minion, MD  PHYSICIAN ASSISTANT:None ANESTHESIA:   General  PREOPERATIVE INDICATIONS:  [name] is a  53 y.o. female with a diagnosis of R foot ulcer who failed conservative measures and elected for surgical management.    The risks benefits and alternatives were discussed with the patient preoperatively including but not limited to the risks of infection, bleeding, nerve injury, cardiopulmonary complications, the need for revision surgery, among others, and the patient was willing to proceed.  OPERATIVE IMPLANTS: 1 g ACell powder, 387 cm of allograft split-thickness skin graft.  @ENCIMAGES @  OPERATIVE FINDINGS: Patient had abscess and osteomyelitis involving the base of the fifth metatarsal cuboid and calcaneus. Tissue was sent for cultures.  OPERATIVE PROCEDURE: Patient was brought the operating room underwent a general anesthetic.  After adequate levels anesthesia were obtained patient's right lower extremity was prepped using DuraPrep draped in the sterile field a timeout was called.  A circumferential incision was made around the plantar necrotic ulcer.  This was resected back to healthy viable wound edges.  This left a wound that was 15 x 9 cm.  There was purulent drainage from the base of the fifth metatarsal cuboid and calcaneus.  Using a bone cutter and a rondure partial resection of the calcaneus cuboid and fifth metatarsal was performed.   This was debrided back to healthy viable bone.  The wound was irrigated with normal saline electrocautery was used for hemostasis.  Patient underwent partial local tissue rearrangement with wound closure of the wound 15 x 9 cm.  2-0 nylon was used.  The remainder of the wound was filled with 1 g ACell powder and 387 cm of the allograft split-thickness skin graft was then applied in multiple layers over the ACell powder.  This was covered with the reticulated cleanse choice dressing plus the solid cleanse choice dressing.  Charlie Pitter was applied this had a good suction fit this was overwrapped with Covan.  Patient was extubated taken the PACU in stable condition.   DISCHARGE PLANNING:  Antibiotic duration: Patient will need 6 weeks of antibiotics based on the tissue cultures obtained intraoperatively.  Patient did have infection of the fifth metatarsal cuboid and calcaneus.  Weightbearing: Strict nonweightbearing right lower extremity  Pain medication: Opioid pathway  Dressing care/ Wound VAC: Continue wound VAC in place for 1 week  Ambulatory devices: Walker  Discharge to: Anticipate discharge to home  Follow-up: In the office 1 week post operative.

## 2019-08-11 NOTE — Anesthesia Preprocedure Evaluation (Addendum)
Anesthesia Evaluation  Patient identified by MRN, date of birth, ID band Patient confused    Reviewed: Allergy & Precautions, NPO status , Patient's Chart, lab work & pertinent test results, reviewed documented beta blocker date and time   History of Anesthesia Complications Negative for: history of anesthetic complications  Airway Mallampati: II  TM Distance: >3 FB Neck ROM: Full    Dental  (+) Dental Advisory Given, Poor Dentition, Missing   Pulmonary Current Smoker and Patient abstained from smoking.,    Pulmonary exam normal breath sounds clear to auscultation       Cardiovascular hypertension, Pt. on medications and Pt. on home beta blockers + Peripheral Vascular Disease and +CHF  Normal cardiovascular exam Rhythm:Regular Rate:Normal  TTE 2018: mild LVH, EF 0000000, grade 2 diastolic dysfunction, mild MR, moderate LAE, mild RAE, PASP 49 mmHg     Neuro/Psych negative neurological ROS  negative psych ROS   GI/Hepatic negative GI ROS, Neg liver ROS,   Endo/Other  diabetes, Type 2, Insulin Dependent  Renal/GU Renal disease  negative genitourinary   Musculoskeletal Right foot ulcer   Abdominal   Peds  Hematology negative hematology ROS (+)   Anesthesia Other Findings Day of surgery medications reviewed with patient.  Reproductive/Obstetrics negative OB ROS                            Anesthesia Physical Anesthesia Plan  ASA: III  Anesthesia Plan: General   Post-op Pain Management:    Induction: Intravenous  PONV Risk Score and Plan: 3 and Treatment may vary due to age or medical condition, Ondansetron and Midazolam  Airway Management Planned: LMA  Additional Equipment: None  Intra-op Plan:   Post-operative Plan: Extubation in OR  Informed Consent: I have reviewed the patients History and Physical, chart, labs and discussed the procedure including the risks, benefits and  alternatives for the proposed anesthesia with the patient or authorized representative who has indicated his/her understanding and acceptance.     Dental advisory given  Plan Discussed with: CRNA  Anesthesia Plan Comments:        Anesthesia Quick Evaluation

## 2019-08-11 NOTE — Progress Notes (Signed)
Patient suffers from right foot irrigation and debridement with subsequent nonweightbearing status which impairs their ability to perform daily activities like ambulating and performing ADL's in the home.  A walker alone will not resolve the issues with performing activities of daily living. A wheelchair will allow patient to safely perform daily activities.  The patient can self propel in the home or has a caregiver who can provide assistance.     Ellamae Sia, PT, DPT Acute Rehabilitation Services Pager [phone] Office [phone]

## 2019-08-11 NOTE — Transfer of Care (Signed)
Immediate Anesthesia Transfer of Care Note  Patient: EMMALYNN NANCARROW  Procedure(s) Performed: IRRIGATION AND DEBRIDEMENT EXTREMITY (Right Foot)  Patient Location: PACU  Anesthesia Type:General  Level of Consciousness: drowsy and patient cooperative  Airway & Oxygen Therapy: Patient Spontanous Breathing and Patient connected to nasal cannula oxygen  Post-op Assessment: Report given to RN, Post -op Vital signs reviewed and stable and Patient moving all extremities  Post vital signs: Reviewed and stable  Last Vitals:  Vitals Value Taken Time  BP    Temp    Pulse 65 08/11/19 1113  Resp 15 08/11/19 1113  SpO2 97 % 08/11/19 1113  Vitals shown include unvalidated device data.  Last Pain:  Vitals:   08/11/19 0720  TempSrc:   PainSc: Asleep      Patients Stated Pain Goal: 0 ([id number])  Complications: No apparent anesthesia complications

## 2019-08-11 NOTE — Evaluation (Signed)
Physical Therapy Evaluation Patient Details Name: Natalie Contreras MRN: [id number] DOB: 1965/11/07 Today's Date: 08/11/2019   History of Present Illness  Natalie Contreras is a 53 year old female with past medical history significant for chronic diastolic heart failure, type 2 diabetes, hypertension, peripheral artery disease with recurrent lower extremity foot infections and tobacco abuse who presents to the emergency department on 11/22 with right foot osteomyelitis. S/p irrigation and debridement by orthopedic surgery 11/25.   Clinical Impression  Pt admitted with above. Pt presents with expected post op pain; currently deferring transfer to chair. Performing bed mobility with modified independence. Education provided re: smoking cessation, right lower extremity open chain exercises for strengthening, weightbearing precautions. Also recommended use of wheelchair for mobility as patient also has prior left toe amputations. Suspect will be able to perform stand vs low pivot with min assist or less. Recommending HHPT at d/c.     Follow Up Recommendations Home health PT;Supervision for mobility/OOB    Equipment Recommendations  Wheelchair (elevating legrests);Wheelchair cushion (measurements PT);3in1 (PT);Rolling walker with 5" wheels  Recommendations for Other Services       Precautions / Restrictions Precautions Precautions: Fall Restrictions Weight Bearing Restrictions: Yes RLE Weight Bearing: Non weight bearing Other Position/Activity Restrictions: "strict NWB"      Mobility  Bed Mobility Overal bed mobility: Modified Independent             General bed mobility comments: HOB elevated; increased time  Transfers                 General transfer comment: deferred by pt  Ambulation/Gait                Stairs            Wheelchair Mobility    Modified Rankin (Stroke Patients Only)       Balance Overall balance assessment: Needs  assistance Sitting-balance support: Feet unsupported Sitting balance-Leahy Scale: Good                                       Pertinent Vitals/Pain Pain Assessment: Faces Faces Pain Scale: Hurts little more Pain Location: right foot Pain Descriptors / Indicators: Guarding Pain Intervention(s): Monitored during session    Home Living Family/patient expects to be discharged to:: Private residence Living Arrangements: Children(daughter) Available Help at Discharge: Family;Available PRN/intermittently Type of Home: Apartment Home Access: Level entry     Home Layout: One level Home Equipment: Wheelchair - manual      Prior Function Level of Independence: Independent               Hand Dominance        Extremity/Trunk Assessment   Upper Extremity Assessment Upper Extremity Assessment: Overall WFL for tasks assessed    Lower Extremity Assessment Lower Extremity Assessment: LLE deficits/detail;RLE deficits/detail RLE Deficits / Details: Overall at least 3/5; expected post op deficits LLE Deficits / Details: Strength 5/5    Cervical / Trunk Assessment Cervical / Trunk Assessment: Normal  Communication   Communication: No difficulties  Cognition Arousal/Alertness: Awake/alert Behavior During Therapy: WFL for tasks assessed/performed Overall Cognitive Status: Within Functional Limits for tasks assessed                                        General Comments  Exercises General Exercises - Lower Extremity Long Arc Quad: Both;10 reps;Seated Other Exercises Other Exercises: Encouraged supine heel slides and SLR's for strengthening   Assessment/Plan    PT Assessment Patient needs continued PT services  PT Problem List Decreased strength;Decreased range of motion;Decreased activity tolerance;Decreased balance;Decreased mobility;Pain       PT Treatment Interventions DME instruction;Functional mobility training;Therapeutic  activities;Therapeutic exercise;Balance training;Patient/family education;Wheelchair mobility training    PT Goals (Current goals can be found in the Care Plan section)  Acute Rehab PT Goals Patient Stated Goal: "get home soon." PT Goal Formulation: With patient Time For Goal Achievement: 08/25/19 Potential to Achieve Goals: Good    Frequency Min 3X/week   Barriers to discharge        Co-evaluation               AM-PAC PT "6 Clicks" Mobility  Outcome Measure Help needed turning from your back to your side while in a flat bed without using bedrails?: None Help needed moving from lying on your back to sitting on the side of a flat bed without using bedrails?: None Help needed moving to and from a bed to a chair (including a wheelchair)?: A Little Help needed standing up from a chair using your arms (e.g., wheelchair or bedside chair)?: A Little Help needed to walk in hospital room?: Total Help needed climbing 3-5 steps with a railing? : Total 6 Click Score: 16    End of Session   Activity Tolerance: Patient tolerated treatment well Patient left: in bed;with call bell/phone within reach Nurse Communication: Mobility status PT Visit Diagnosis: Other abnormalities of gait and mobility (R26.89);Pain Pain - Right/Left: Right Pain - part of body: Ankle and joints of foot    Time: [id number] PT Time Calculation (min) (ACUTE ONLY): 12 min   Charges:   PT Evaluation $PT Eval Moderate Complexity: [place], [place], DPT Acute Rehabilitation Services Pager [phone] Office [phone]   Willy Eddy 08/11/2019, 4:40 PM

## 2019-08-12 LAB — BASIC METABOLIC PANEL
Anion gap: 10 (ref 5–15)
BUN: 31 mg/dL — ABNORMAL HIGH (ref 6–20)
CO2: 23 mmol/L (ref 22–32)
Calcium: 8.5 mg/dL — ABNORMAL LOW (ref 8.9–10.3)
Chloride: 105 mmol/L (ref 98–111)
Creatinine, Ser: 1.21 mg/dL — ABNORMAL HIGH (ref 0.44–1.00)
GFR calc Af Amer: 60 mL/min — ABNORMAL LOW (ref >60)
GFR calc non Af Amer: 52 mL/min — ABNORMAL LOW (ref >60)
Glucose, Bld: 166 mg/dL — ABNORMAL HIGH (ref 70–99)
Potassium: 4.8 mmol/L (ref 3.5–5.1)
Sodium: 138 mmol/L (ref 135–145)

## 2019-08-12 LAB — GLUCOSE, CAPILLARY
Glucose-Capillary: 143 mg/dL — ABNORMAL HIGH (ref 70–99)
Glucose-Capillary: 184 mg/dL — ABNORMAL HIGH (ref 70–99)
Glucose-Capillary: 178 mg/dL — ABNORMAL HIGH (ref 70–99)
Glucose-Capillary: 133 mg/dL — ABNORMAL HIGH (ref 70–99)

## 2019-08-12 NOTE — Plan of Care (Signed)

## 2019-08-12 NOTE — Progress Notes (Signed)
Physical Therapy Treatment Patient Details Name: Natalie Contreras MRN: [id number] DOB: 1966/04/03 Today's Date: 08/12/2019    History of Present Illness Natalie Contreras is a 53 year old female with past medical history significant for chronic diastolic heart failure, type 2 diabetes, hypertension, peripheral artery disease with recurrent lower extremity foot infections and tobacco abuse who presents to the emergency department on 11/22 with right foot osteomyelitis. S/p irrigation and debridement by orhtopedic surgery 11/25.     PT Comments    Pt tolerated treatment well despite becoming anxious during standing activities and attempts at hopping. Pt demonstrates good knowledge of WB precautions, with inly brief moments of R foot touching floor during attempts at sit to stand and hopping. Pt demonstrates improved lateral/pivot transfers with use of L LE and UE support to drop arm wheelchair. Pt refusing wheelchair mobility at this time. Pt will benefit from continued acute PT POC to improve transfers, gait training, and wheelchair mobility prior to discharge home.   Follow Up Recommendations  Home health PT;Supervision/Assistance - 24 hour     Equipment Recommendations  Wheelchair (measurements PT);Other (comment);Wheelchair cushion (measurements PT);3in1 (PT);Rolling walker with 5" wheels    Recommendations for Other Services       Precautions / Restrictions Precautions Precautions: Fall Restrictions Weight Bearing Restrictions: Yes RLE Weight Bearing: Non weight bearing Other Position/Activity Restrictions: "strict NWB"    Mobility  Bed Mobility Overal bed mobility: Modified Independent             General bed mobility comments: HOB elevated; increased time  Transfers Overall transfer level: Needs assistance Equipment used: Rolling walker (2 wheeled) Transfers: Sit to/from Stand;Lateral/Scoot Transfers Sit to Stand: Mod assist;From elevated surface        Lateral/Scoot Transfers: Min assist General transfer comment: pt requiring modA to perform sit to stand transfer x 2, on first attempt R foot touching ground, on 2nd attempt foot not touching  Ambulation/Gait Ambulation/Gait assistance: Min assist Gait Distance (Feet): 2 Feet Assistive device: Rolling walker (2 wheeled) Gait Pattern/deviations: (hop to pattern) Gait velocity: reduced Gait velocity interpretation: <1.31 ft/sec, indicative of household ambulator General Gait Details: pt able to take 2 small hops forward and one small shuffle of L foot backward, pt R foot briefly touching ground on backward hop   Stairs             Wheelchair Mobility    Modified Rankin (Stroke Patients Only)       Balance Overall balance assessment: Needs assistance Sitting-balance support: No upper extremity supported;Feet supported Sitting balance-Leahy Scale: Good     Standing balance support: Bilateral upper extremity supported Standing balance-Leahy Scale: Fair Standing balance comment: minG-minA                            Cognition Arousal/Alertness: Awake/alert Behavior During Therapy: WFL for tasks assessed/performed Overall Cognitive Status: Within Functional Limits for tasks assessed                                        Exercises      General Comments General comments (skin integrity, edema, etc.): VSS, pt anxious during standing, much more comfortable with lateral scoot/pivot transfers      Pertinent Vitals/Pain Pain Assessment: Faces Faces Pain Scale: Hurts little more Pain Location: right foot Pain Descriptors / Indicators: Guarding Pain Intervention(s): Limited activity within patient's tolerance  Home Living                      Prior Function            PT Goals (current goals can now be found in the care plan section) Acute Rehab PT Goals Patient Stated Goal: "get home soon." Progress towards PT goals:  Progressing toward goals    Frequency    Min 3X/week      PT Plan Current plan remains appropriate    Co-evaluation              AM-PAC PT "6 Clicks" Mobility   Outcome Measure  Help needed turning from your back to your side while in a flat bed without using bedrails?: None Help needed moving from lying on your back to sitting on the side of a flat bed without using bedrails?: None Help needed moving to and from a bed to a chair (including a wheelchair)?: A Little Help needed standing up from a chair using your arms (e.g., wheelchair or bedside chair)?: A Lot Help needed to walk in hospital room?: A Lot Help needed climbing 3-5 steps with a railing? : Total 6 Click Score: 16    End of Session Equipment Utilized During Treatment: Gait belt Activity Tolerance: Patient tolerated treatment well Patient left: in bed;with call bell/phone within reach;with bed alarm set;with family/visitor present Nurse Communication: Mobility status PT Visit Diagnosis: Other abnormalities of gait and mobility (R26.89);Pain Pain - Right/Left: Right Pain - part of body: Ankle and joints of foot     Time: 1455-1518 PT Time Calculation (min) (ACUTE ONLY): 23 min  Charges:  $Therapeutic Activity: 23-37 mins                     Zenaida Niece, PT, DPT Acute Rehabilitation Pager: [phone]    Zenaida Niece 08/12/2019, 4:04 PM

## 2019-08-12 NOTE — Progress Notes (Signed)
Wound vac canister with large amounts of bubbles present in the drainage.  Reinforced the wound vac dressing and had a decrease in the bubble drainage.

## 2019-08-12 NOTE — Progress Notes (Signed)
Patient ID: Natalie Contreras, female   DOB: Sep 19, 1965, 53 y.o.   MRN: [id number] Patient is postoperative day 1 debridement and split-thickness skin graft right foot status post revascularization to the right lower extremity.  There is 50 cc in the wound VAC canister.  Discussed with the patient the importance of strict nonweightbearing on the right.  Patient may discharge to home once she is safe with ambulation nonweightbearing and would discharge on oral antibiotic for a month pending the culture sensitivities.  Patient will need discharge on the portable Praveena wound VAC pump.

## 2019-08-12 NOTE — Progress Notes (Signed)
PROGRESS NOTE    Natalie Contreras  [id number] DOB: 02/05/1966 DOA: 08/08/2019 PCP: Celene Squibb, MD     Brief Narrative:  Natalie Contreras is a 53 year old female with past medical history significant for chronic diastolic heart failure, type 2 diabetes, hypertension, peripheral artery disease with recurrent lower extremity foot infections and tobacco abuse who presents to the emergency department on 11/22 with worsening right foot wounds.  She underwent angiography with angioplasty by vascular surgery 11/24.  Underwent irrigation and debridement with Dr. Sharol Given on 11/25.  New events last 24 hours / Subjective: Doing well, required pain medicine last night.  Having some queasiness after breakfast this morning.  No other complaints  Assessment & Plan:   Principal Problem:   Abscess or cellulitis of foot Active Problems:   Benign essential HTN   Diabetic infection of right foot (HCC)   Tobacco abuse   PAD (peripheral artery disease) (HCC)   Chronic diastolic CHF (congestive heart failure) (HCC)   AKI (acute kidney injury) ([place])   Subacute osteomyelitis, right ankle and foot (HCC)   Obesity (BMI 30-39.9)   Severe protein-calorie malnutrition ([place])   Gangrene of right foot ([place])   Right foot osteomyelitis -Failed outpatient antibiotic treatment (doxy, keflex, bactrim) -Sole of right foot wound with final wound cultures from 06/13/2019 with pansensitive MSSA and abundant Finegoldia Magna  -Status post angioplasty by vascular surgery 11/24 -Irrigation and debridement by orthopedic surgery 11/25 -Recommended 6 weeks antibiotics based on tissue cultures obtained intraoperatively, strict nonweightbearing right lower extremity, follow-up in office in 1 week with Dr. Sharol Given -Continue cefepime, vancomycin until tissue cultures/sensitivity available -Wound VAC in place  PAD -Status post angioplasty -Continue aspirin, Plavix, Zocor  Acute kidney injury on CKD stage IIIa  -Cr on  admission 1.73 (baseline ~1.2) -Holding metformin and torsemide -Resolved  DM2 uncontrolled with hyperglycemia -Recent A1c 10.7 -Hold Metformin and Jardiance -Continue Levemir, sliding scale insulin  HTN -Continue Coreg   Chronic diastolic heart failure -Stable  Tobacco abuse  -Counseled against further tobacco use -Nicotine patch   DVT prophylaxis: Subcutaneous heparin Code Status: Full code Family Communication: No family at bedside Disposition Plan: Ready for discharge with home health when cleared by orthopedic surgery   Antimicrobials:  Anti-infectives (From admission, onward)   Start     Dose/Rate Route Frequency Ordered Stop   08/11/19 0800  ceFAZolin (ANCEF) IVPB 2g/100 mL premix     2 g 200 mL/hr over 30 Minutes Intravenous To ShortStay Surgical 08/11/19 0317 08/11/19 1015   08/10/19 1100  vancomycin (VANCOCIN) IVPB 750 mg/150 ml premix     750 mg 150 mL/hr over 60 Minutes Intravenous Every 24 hours 08/10/19 1022     08/09/19 1100  vancomycin (VANCOCIN) 1,250 mg in sodium chloride 0.9 % 250 mL IVPB  Status:  Discontinued     1,250 mg 166.7 mL/hr over 90 Minutes Intravenous Every 24 hours 08/08/19 1355 08/09/19 0846   08/09/19 1100  vancomycin (VANCOCIN) IVPB 1000 mg/200 mL premix  Status:  Discontinued     1,000 mg 200 mL/hr over 60 Minutes Intravenous Every 24 hours 08/09/19 0846 08/10/19 1022   08/08/19 2200  ceFEPIme (MAXIPIME) 2 g in sodium chloride 0.9 % 100 mL IVPB     2 g 200 mL/hr over 30 Minutes Intravenous Every 12 hours 08/08/19 1230     08/08/19 1200  vancomycin (VANCOCIN) IVPB 1000 mg/200 mL premix     1,000 mg 200 mL/hr over 60 Minutes Intravenous  Once 08/08/19  1152 08/08/19 1405   08/08/19 1045  ceFEPIme (MAXIPIME) 2 g in sodium chloride 0.9 % 100 mL IVPB     2 g 200 mL/hr over 30 Minutes Intravenous  Once 08/08/19 1035 08/08/19 1145   08/08/19 0845  vancomycin (VANCOCIN) IVPB 1000 mg/200 mL premix     1,000 mg 200 mL/hr over 60 Minutes  Intravenous  Once 08/08/19 0838 08/08/19 1032       Objective: Vitals:   08/11/19 1152 08/11/19 1651 08/11/19 1953 08/12/19 0900  BP: (!) 155/63 (!) 163/57 (!) 171/64 (!) 160/68  Pulse: 67 65 67 73  Resp: 15  20   Temp: 97.6 F (36.4 C)  97.6 F (36.4 C)   TempSrc: Oral  Oral   SpO2: 100%  93%   Weight:      Height:        Intake/Output Summary (Last 24 hours) at 08/12/2019 1053 Last data filed at 08/11/2019 1954 Gross per 24 hour  Intake 914.77 ml  Output 730 ml  Net 184.77 ml   Filed Weights   08/08/19 0824  Weight: 96.2 kg    Examination: General exam: Appears calm and comfortable  Respiratory system: Clear to auscultation. Respiratory effort normal. Cardiovascular system: S1 & S2 heard, RRR. No pedal edema. Gastrointestinal system: Abdomen is nondistended, soft and nontender. Normal bowel sounds heard. Central nervous system: Alert and oriented. Non focal exam. Speech clear  Extremities: Wound VAC in place to right lower extremity Psychiatry: Judgement and insight appear stable. Mood & affect appropriate.    Data Reviewed: I have personally reviewed following labs and imaging studies  CBC: Recent Labs  Lab 08/08/19 0839 08/09/19 0713 08/10/19 0300 08/11/19 0522  WBC 11.5* 8.3 9.8 10.2  NEUTROABS 9.2*  --  6.1 8.0*  HGB 10.6* 10.2* 9.0* 9.3*  HCT 34.5* 33.3* 29.5* 29.8*  MCV 87.6 86.9 87.3 86.1  PLT 508* 453* 411* [id number]   Basic Metabolic Panel: Recent Labs  Lab 08/08/19 0839 08/09/19 0713 08/10/19 0300 08/11/19 0522 08/12/19 0235  NA 136 140 134* 133* 138  K 3.1* 3.5 3.4* 4.1 4.8  CL 95* 104 101 101 105  CO2 27 25 25 23 23   GLUCOSE 180* 131* 138* 236* 166*  BUN 37* 31* 33* 32* 31*  CREATININE 1.73* 1.43* 1.71* 1.57* 1.21*  CALCIUM 8.6* 8.5* 8.1* 8.1* 8.5*  MG  --   --  2.2 2.1  --   PHOS  --   --  3.2 2.6  --    GFR: Estimated Creatinine Clearance: 61.9 mL/min (A) (by C-G formula based on SCr of 1.21 mg/dL (H)). Liver Function Tests:  Recent Labs  Lab 08/10/19 0300 08/11/19 0522  ALBUMIN 1.8* 1.7*   No results for input(s): LIPASE, AMYLASE in the last 168 hours. No results for input(s): AMMONIA in the last 168 hours. Coagulation Profile: No results for input(s): INR, PROTIME in the last 168 hours. Cardiac Enzymes: No results for input(s): CKTOTAL, CKMB, CKMBINDEX, TROPONINI in the last 168 hours. BNP (last 3 results) No results for input(s): PROBNP in the last 8760 hours. HbA1C: No results for input(s): HGBA1C in the last 72 hours. CBG: Recent Labs  Lab 08/11/19 1115 08/11/19 1147 08/11/19 1610 08/11/19 2126 08/12/19 0555  GLUCAP 132* 116* 219* 177* 143*   Lipid Profile: No results for input(s): CHOL, HDL, LDLCALC, TRIG, CHOLHDL, LDLDIRECT in the last 72 hours. Thyroid Function Tests: No results for input(s): TSH, T4TOTAL, FREET4, T3FREE, THYROIDAB in the last 72 hours. Anemia Panel:  No results for input(s): VITAMINB12, FOLATE, FERRITIN, TIBC, IRON, RETICCTPCT in the last 72 hours. Sepsis Labs: No results for input(s): PROCALCITON, LATICACIDVEN in the last 168 hours.  Recent Results (from the past 240 hour(s))  SARS CORONAVIRUS 2 (TAT 6-24 HRS) Nasopharyngeal Nasopharyngeal Swab     Status: None   Collection Time: 08/08/19 11:34 AM   Specimen: Nasopharyngeal Swab  Result Value Ref Range Status   SARS Coronavirus 2 NEGATIVE NEGATIVE Final    Comment: (NOTE) SARS-CoV-2 target nucleic acids are NOT DETECTED. The SARS-CoV-2 RNA is generally detectable in upper and lower respiratory specimens during the acute phase of infection. Negative results do not preclude SARS-CoV-2 infection, do not rule out co-infections with other pathogens, and should not be used as the sole basis for treatment or other patient management decisions. Negative results must be combined with clinical observations, patient history, and epidemiological information. The expected result is Negative. Fact Sheet for Patients:  SugarRoll.be Fact Sheet for Healthcare Providers: https://www.woods-mathews.com/ This test is not yet approved or cleared by the [place] FDA and  has been authorized for detection and/or diagnosis of SARS-CoV-2 by FDA under an Emergency Use Authorization (EUA). This EUA will remain  in effect (meaning this test can be used) for the duration of the COVID-19 declaration under Section 56 4(b)(1) of the Act, 21 U.S.C. section 360bbb-3(b)(1), unless the authorization is terminated or revoked sooner. Performed at [facility], [place] [address] [place], [place] [address]   Surgical pcr screen     Status: None   Collection Time: 08/11/19  4:04 AM   Specimen: Nasal Mucosa; Nasal Swab  Result Value Ref Range Status   MRSA, PCR NEGATIVE NEGATIVE Final   Staphylococcus aureus NEGATIVE NEGATIVE Final    Comment: (NOTE) The Xpert SA Assay (FDA approved for NASAL specimens in patients 31 years of age and older), is one component of a comprehensive surveillance program. It is not intended to diagnose infection nor to guide or monitor treatment. Performed at [facility], [place] [address] [place], [place] [address]   Aerobic/Anaerobic Culture (surgical/deep wound)     Status: None (Preliminary result)   Collection Time: 08/11/19 10:22 AM   Specimen: Wound  Result Value Ref Range Status   Specimen Description TISSUE RIGHT FOOT  Final   Special Requests NONE  Final   Gram Stain   Final    FEW WBC PRESENT, PREDOMINANTLY PMN MODERATE GRAM POSITIVE COCCI IN PAIRS RARE GRAM NEGATIVE RODS Performed at [facility], [address], [place], [place] [address]    Culture PENDING  Incomplete   Report Status PENDING  Incomplete      Radiology Studies: No results found.    Scheduled Meds: . aspirin EC  81 mg Oral Q breakfast  . carvedilol  3.125 mg Oral BID WC  . clopidogrel  300 mg Oral Once   Followed by  . clopidogrel  75 mg  Oral Q breakfast  . docusate sodium  100 mg Oral BID  . gabapentin  300 mg Oral TID  . heparin  5,000 Units Subcutaneous Q8H  . influenza vac split quadrivalent PF  0.5 mL Intramuscular Tomorrow-1000  . insulin aspart  0-15 Units Subcutaneous TID WC  . insulin aspart  0-5 Units Subcutaneous QHS  . insulin aspart  3 Units Subcutaneous TID WC  . insulin detemir  35 Units Subcutaneous QHS  . nicotine  14 mg Transdermal Daily  . pneumococcal 23 valent vaccine  0.5  mL Intramuscular Tomorrow-1000  . polyethylene glycol  17 g Oral BID  . simvastatin  40 mg Oral Daily  . sodium chloride flush  3 mL Intravenous Q12H  . sodium chloride flush  3 mL Intravenous Q12H   Continuous Infusions: . sodium chloride    . sodium chloride    . ceFEPime (MAXIPIME) IV 2 g (08/12/19 1045)  . lactated ringers 10 mL/hr at 08/11/19 0841  . vancomycin       LOS: 4 days      Time spent: 25 minutes   Dessa Phi, DO [facility] 08/12/2019, 10:53 AM   Available via Epic secure chat 7am-7pm After these hours, please refer to coverage provider listed on [email or web address]

## 2019-08-12 NOTE — Anesthesia Postprocedure Evaluation (Signed)
Anesthesia Post Note  Patient: Natalie Contreras  Procedure(s) Performed: IRRIGATION AND DEBRIDEMENT EXTREMITY (Right Foot)     Patient location during evaluation: PACU Anesthesia Type: General Level of consciousness: sedated and patient cooperative Pain management: pain level controlled Vital Signs Assessment: post-procedure vital signs reviewed and stable Respiratory status: spontaneous breathing Cardiovascular status: stable Anesthetic complications: no    Last Vitals:  Vitals:   08/12/19 0900 08/12/19 1119  BP: (!) 160/68 (!) 166/60  Pulse: 73 74  Resp:  18  Temp:  36.7 C  SpO2:      Last Pain:  Vitals:   08/12/19 1119  TempSrc: Oral  PainSc:                  Nolon Nations

## 2019-08-13 ENCOUNTER — Encounter (HOSPITAL_COMMUNITY): Payer: Self-pay | Admitting: Orthopedic Surgery

## 2019-08-13 LAB — GLUCOSE, CAPILLARY
Glucose-Capillary: 130 mg/dL — ABNORMAL HIGH (ref 70–99)
Glucose-Capillary: 88 mg/dL (ref 70–99)
Glucose-Capillary: 141 mg/dL — ABNORMAL HIGH (ref 70–99)
Glucose-Capillary: 177 mg/dL — ABNORMAL HIGH (ref 70–99)

## 2019-08-13 LAB — BASIC METABOLIC PANEL
Anion gap: 9 (ref 5–15)
BUN: 34 mg/dL — ABNORMAL HIGH (ref 6–20)
CO2: 22 mmol/L (ref 22–32)
Calcium: 8.4 mg/dL — ABNORMAL LOW (ref 8.9–10.3)
Chloride: 107 mmol/L (ref 98–111)
Creatinine, Ser: 1.29 mg/dL — ABNORMAL HIGH (ref 0.44–1.00)
GFR calc Af Amer: 56 mL/min — ABNORMAL LOW (ref >60)
GFR calc non Af Amer: 48 mL/min — ABNORMAL LOW (ref >60)
Glucose, Bld: 135 mg/dL — ABNORMAL HIGH (ref 70–99)
Potassium: 4 mmol/L (ref 3.5–5.1)
Sodium: 138 mmol/L (ref 135–145)

## 2019-08-13 MED ORDER — LEVOFLOXACIN IN D5W 750 MG/150ML IV SOLN
750.0000 mg | INTRAVENOUS | Status: DC
  Administered 2019-08-13: 750 mg via INTRAVENOUS
  Filled 2019-08-13 (×2): qty 150

## 2019-08-13 MED ORDER — SODIUM CHLORIDE 0.9 % IV SOLN
2.0000 g | INTRAVENOUS | Status: DC
  Filled 2019-08-13: qty 20

## 2019-08-13 NOTE — Progress Notes (Signed)
PROGRESS NOTE    Natalie Contreras  [id number] DOB: Sep 20, 1965 DOA: 08/08/2019 PCP: Celene Squibb, MD     Brief Narrative:  Natalie Contreras is a 53 year old female with past medical history significant for chronic diastolic heart failure, type 2 diabetes, hypertension, peripheral artery disease with recurrent lower extremity foot infections and tobacco abuse who presents to the emergency department on 11/22 with worsening right foot wounds.  She underwent angiography with angioplasty by vascular surgery 11/24.  Underwent irrigation and debridement with Dr. Sharol Given on 11/25.  New events last 24 hours / Subjective: States that her nausea has resolved.  No acute issues overnight and pain has been well controlled.  Assessment & Plan:   Principal Problem:   Abscess or cellulitis of foot Active Problems:   Benign essential HTN   Diabetic infection of right foot (HCC)   Tobacco abuse   PAD (peripheral artery disease) (HCC)   Chronic diastolic CHF (congestive heart failure) (HCC)   AKI (acute kidney injury) ([place])   Subacute osteomyelitis, right ankle and foot (HCC)   Obesity (BMI 30-39.9)   Severe protein-calorie malnutrition ([place])   Gangrene of right foot ([place])   Right foot osteomyelitis -Failed outpatient antibiotic treatment (doxy, keflex, bactrim) -Sole of right foot wound with final wound cultures from 06/13/2019 with pansensitive MSSA and abundant Finegoldia Magna  -Status post angioplasty by vascular surgery 11/24 -Irrigation and debridement by orthopedic surgery 11/25 -Recommended 6 weeks antibiotics based on tissue cultures obtained intraoperatively, strict nonweightbearing right lower extremity, follow-up in office in 1 week with Dr. Sharol Given -Continue cefepime until tissue cultures/sensitivity available -Wound VAC in place  PAD -Status post angioplasty -Continue aspirin, Plavix, Zocor  Acute kidney injury on CKD stage IIIa  -Cr on admission 1.73 (baseline ~1.2) -Holding  metformin and torsemide -Resolved creatinine back to baseline  DM2 uncontrolled with hyperglycemia -Recent A1c 10.7 -Hold Metformin and Jardiance -Continue Levemir, sliding scale insulin  HTN -Continue Coreg   Chronic diastolic heart failure -Stable  Tobacco abuse  -Counseled against further tobacco use -Nicotine patch   DVT prophylaxis: Subcutaneous heparin Code Status: Full code Family Communication: No family at bedside Disposition Plan: Ready for discharge with home health when cleared by orthopedic surgery, awaiting wound culture sensitivity   Antimicrobials:  Anti-infectives (From admission, onward)   Start     Dose/Rate Route Frequency Ordered Stop   08/11/19 0800  ceFAZolin (ANCEF) IVPB 2g/100 mL premix     2 g 200 mL/hr over 30 Minutes Intravenous To ShortStay Surgical 08/11/19 0317 08/11/19 1015   08/10/19 1100  vancomycin (VANCOCIN) IVPB 750 mg/150 ml premix  Status:  Discontinued     750 mg 150 mL/hr over 60 Minutes Intravenous Every 24 hours 08/10/19 1022 08/12/19 1456   08/09/19 1100  vancomycin (VANCOCIN) 1,250 mg in sodium chloride 0.9 % 250 mL IVPB  Status:  Discontinued     1,250 mg 166.7 mL/hr over 90 Minutes Intravenous Every 24 hours 08/08/19 1355 08/09/19 0846   08/09/19 1100  vancomycin (VANCOCIN) IVPB 1000 mg/200 mL premix  Status:  Discontinued     1,000 mg 200 mL/hr over 60 Minutes Intravenous Every 24 hours 08/09/19 0846 08/10/19 1022   08/08/19 2200  ceFEPIme (MAXIPIME) 2 g in sodium chloride 0.9 % 100 mL IVPB     2 g 200 mL/hr over 30 Minutes Intravenous Every 12 hours 08/08/19 1230     08/08/19 1200  vancomycin (VANCOCIN) IVPB 1000 mg/200 mL premix     1,000 mg  200 mL/hr over 60 Minutes Intravenous  Once 08/08/19 1152 08/08/19 1405   08/08/19 1045  ceFEPIme (MAXIPIME) 2 g in sodium chloride 0.9 % 100 mL IVPB     2 g 200 mL/hr over 30 Minutes Intravenous  Once 08/08/19 1035 08/08/19 1145   08/08/19 0845  vancomycin (VANCOCIN) IVPB 1000  mg/200 mL premix     1,000 mg 200 mL/hr over 60 Minutes Intravenous  Once 08/08/19 0838 08/08/19 1032       Objective: Vitals:   08/12/19 1119 08/12/19 2111 08/13/19 0548 08/13/19 0747  BP: (!) 166/60 (!) 137/51 (!) 150/57 (!) 131/55  Pulse: 74 64 66 65  Resp: 18 18 19    Temp: 98 F (36.7 C) 98 F (36.7 C) 97.9 F (36.6 C) 98.4 F (36.9 C)  TempSrc: Oral Oral Oral Oral  SpO2:  95% 96% 98%  Weight:      Height:        Intake/Output Summary (Last 24 hours) at 08/13/2019 0949 Last data filed at 08/13/2019 0756 Gross per 24 hour  Intake 850 ml  Output 675 ml  Net 175 ml   Filed Weights   08/08/19 0824  Weight: 96.2 kg    Examination: General exam: Appears calm and comfortable  Respiratory system: Clear to auscultation. Respiratory effort normal. Cardiovascular system: S1 & S2 heard, RRR. No pedal edema. Gastrointestinal system: Abdomen is nondistended, soft and nontender. Normal bowel sounds heard. Central nervous system: Alert and oriented. Non focal exam. Speech clear  Extremities: Right lower extremity wrapped and wound VAC in place Psychiatry: Judgement and insight appear stable. Mood & affect appropriate.    Data Reviewed: I have personally reviewed following labs and imaging studies  CBC: Recent Labs  Lab 08/08/19 0839 08/09/19 0713 08/10/19 0300 08/11/19 0522  WBC 11.5* 8.3 9.8 10.2  NEUTROABS 9.2*  --  6.1 8.0*  HGB 10.6* 10.2* 9.0* 9.3*  HCT 34.5* 33.3* 29.5* 29.8*  MCV 87.6 86.9 87.3 86.1  PLT 508* 453* 411* [id number]   Basic Metabolic Panel: Recent Labs  Lab 08/09/19 0713 08/10/19 0300 08/11/19 0522 08/12/19 0235 08/13/19 0224  NA 140 134* 133* 138 138  K 3.5 3.4* 4.1 4.8 4.0  CL 104 101 101 105 107  CO2 25 25 23 23 22   GLUCOSE 131* 138* 236* 166* 135*  BUN 31* 33* 32* 31* 34*  CREATININE 1.43* 1.71* 1.57* 1.21* 1.29*  CALCIUM 8.5* 8.1* 8.1* 8.5* 8.4*  MG  --  2.2 2.1  --   --   PHOS  --  3.2 2.6  --   --    GFR: Estimated Creatinine  Clearance: 58.1 mL/min (A) (by C-G formula based on SCr of 1.29 mg/dL (H)). Liver Function Tests: Recent Labs  Lab 08/10/19 0300 08/11/19 0522  ALBUMIN 1.8* 1.7*   No results for input(s): LIPASE, AMYLASE in the last 168 hours. No results for input(s): AMMONIA in the last 168 hours. Coagulation Profile: No results for input(s): INR, PROTIME in the last 168 hours. Cardiac Enzymes: No results for input(s): CKTOTAL, CKMB, CKMBINDEX, TROPONINI in the last 168 hours. BNP (last 3 results) No results for input(s): PROBNP in the last 8760 hours. HbA1C: No results for input(s): HGBA1C in the last 72 hours. CBG: Recent Labs  Lab 08/12/19 0555 08/12/19 1117 08/12/19 1630 08/12/19 2115 08/13/19 0554  GLUCAP 143* 184* 178* 133* 130*   Lipid Profile: No results for input(s): CHOL, HDL, LDLCALC, TRIG, CHOLHDL, LDLDIRECT in the last 72 hours. Thyroid Function Tests:  No results for input(s): TSH, T4TOTAL, FREET4, T3FREE, THYROIDAB in the last 72 hours. Anemia Panel: No results for input(s): VITAMINB12, FOLATE, FERRITIN, TIBC, IRON, RETICCTPCT in the last 72 hours. Sepsis Labs: No results for input(s): PROCALCITON, LATICACIDVEN in the last 168 hours.  Recent Results (from the past 240 hour(s))  SARS CORONAVIRUS 2 (TAT 6-24 HRS) Nasopharyngeal Nasopharyngeal Swab     Status: None   Collection Time: 08/08/19 11:34 AM   Specimen: Nasopharyngeal Swab  Result Value Ref Range Status   SARS Coronavirus 2 NEGATIVE NEGATIVE Final    Comment: (NOTE) SARS-CoV-2 target nucleic acids are NOT DETECTED. The SARS-CoV-2 RNA is generally detectable in upper and lower respiratory specimens during the acute phase of infection. Negative results do not preclude SARS-CoV-2 infection, do not rule out co-infections with other pathogens, and should not be used as the sole basis for treatment or other patient management decisions. Negative results must be combined with clinical observations, patient history,  and epidemiological information. The expected result is Negative. Fact Sheet for Patients: SugarRoll.be Fact Sheet for Healthcare Providers: https://www.woods-mathews.com/ This test is not yet approved or cleared by the [place] FDA and  has been authorized for detection and/or diagnosis of SARS-CoV-2 by FDA under an Emergency Use Authorization (EUA). This EUA will remain  in effect (meaning this test can be used) for the duration of the COVID-19 declaration under Section 56 4(b)(1) of the Act, 21 U.S.C. section 360bbb-3(b)(1), unless the authorization is terminated or revoked sooner. Performed at [facility], [place] [address], [place], [place] [address]   Surgical pcr screen     Status: None   Collection Time: 08/11/19  4:04 AM   Specimen: Nasal Mucosa; Nasal Swab  Result Value Ref Range Status   MRSA, PCR NEGATIVE NEGATIVE Final   Staphylococcus aureus NEGATIVE NEGATIVE Final    Comment: (NOTE) The Xpert SA Assay (FDA approved for NASAL specimens in patients 53 years of age and older), is one component of a comprehensive surveillance program. It is not intended to diagnose infection nor to guide or monitor treatment. Performed at [facility], [place] [address] [place], [place] [address]   Aerobic/Anaerobic Culture (surgical/deep wound)     Status: None (Preliminary result)   Collection Time: 08/11/19 10:22 AM   Specimen: Wound  Result Value Ref Range Status   Specimen Description TISSUE RIGHT FOOT  Final   Special Requests NONE  Final   Gram Stain   Final    FEW WBC PRESENT, PREDOMINANTLY PMN MODERATE GRAM POSITIVE COCCI IN PAIRS RARE GRAM NEGATIVE RODS Performed at [facility], [address], [place], [place] [address]    Culture   Final    MODERATE GROUP B STREP(S.AGALACTIAE)ISOLATED TESTING AGAINST S. AGALACTIAE NOT ROUTINELY PERFORMED DUE TO PREDICTABILITY OF AMP/PEN/VAN SUSCEPTIBILITY. RARE  ENTEROBACTER AEROGENES    Report Status PENDING  Incomplete   Organism ID, Bacteria ENTEROBACTER AEROGENES  Final      Susceptibility   Enterobacter aerogenes - MIC*    CEFAZOLIN >=64 RESISTANT Resistant     CEFEPIME <=1 SENSITIVE Sensitive     CEFTAZIDIME <=1 SENSITIVE Sensitive     CEFTRIAXONE <=1 SENSITIVE Sensitive     CIPROFLOXACIN <=0.25 SENSITIVE Sensitive     GENTAMICIN <=1 SENSITIVE Sensitive     IMIPENEM 1 SENSITIVE Sensitive     TRIMETH/SULFA <=20 SENSITIVE Sensitive     PIP/TAZO 8 SENSITIVE Sensitive     * RARE ENTEROBACTER AEROGENES  Radiology Studies: No results found.    Scheduled Meds:  aspirin EC  81 mg Oral Q breakfast   carvedilol  3.125 mg Oral BID WC   clopidogrel  300 mg Oral Once   Followed by   clopidogrel  75 mg Oral Q breakfast   docusate sodium  100 mg Oral BID   gabapentin  300 mg Oral TID   heparin  5,000 Units Subcutaneous Q8H   influenza vac split quadrivalent PF  0.5 mL Intramuscular Tomorrow-1000   insulin aspart  0-15 Units Subcutaneous TID WC   insulin aspart  0-5 Units Subcutaneous QHS   insulin aspart  3 Units Subcutaneous TID WC   insulin detemir  35 Units Subcutaneous QHS   nicotine  14 mg Transdermal Daily   pneumococcal 23 valent vaccine  0.5 mL Intramuscular Tomorrow-1000   polyethylene glycol  17 g Oral BID   simvastatin  40 mg Oral Daily   sodium chloride flush  3 mL Intravenous Q12H   sodium chloride flush  3 mL Intravenous Q12H   Continuous Infusions:  sodium chloride     sodium chloride     ceFEPime (MAXIPIME) IV 2 g (08/12/19 2129)   lactated ringers 10 mL/hr at 08/11/19 0841     LOS: 5 days      Time spent: 25 minutes   Dessa Phi, DO [facility] 08/13/2019, 9:49 AM   Available via Epic secure chat 7am-7pm After these hours, please refer to coverage provider listed on [email or web address]

## 2019-08-14 LAB — BASIC METABOLIC PANEL
Anion gap: 7 (ref 5–15)
BUN: 32 mg/dL — ABNORMAL HIGH (ref 6–20)
CO2: 23 mmol/L (ref 22–32)
Calcium: 8.4 mg/dL — ABNORMAL LOW (ref 8.9–10.3)
Chloride: 111 mmol/L (ref 98–111)
Creatinine, Ser: 1.2 mg/dL — ABNORMAL HIGH (ref 0.44–1.00)
GFR calc Af Amer: >60 mL/min (ref >60)
GFR calc non Af Amer: 52 mL/min — ABNORMAL LOW (ref >60)
Glucose, Bld: 111 mg/dL — ABNORMAL HIGH (ref 70–99)
Potassium: 4.4 mmol/L (ref 3.5–5.1)
Sodium: 141 mmol/L (ref 135–145)

## 2019-08-14 LAB — GLUCOSE, CAPILLARY
Glucose-Capillary: 118 mg/dL — ABNORMAL HIGH (ref 70–99)
Glucose-Capillary: 198 mg/dL — ABNORMAL HIGH (ref 70–99)
Glucose-Capillary: 155 mg/dL — ABNORMAL HIGH (ref 70–99)

## 2019-08-14 MED ORDER — LEVOFLOXACIN 750 MG PO TABS: 750.0000 mg | ORAL_TABLET | Freq: Every day | ORAL | 0 refills | Status: DC

## 2019-08-14 NOTE — Plan of Care (Signed)

## 2019-08-14 NOTE — Plan of Care (Signed)

## 2019-08-14 NOTE — Evaluation (Addendum)
Occupational Therapy Evaluation Patient Details Name: Natalie Contreras MRN: [id number] DOB: February 16, 1966 Today's Date: 08/14/2019    History of Present Illness Natalie Contreras is a 53 year old female with past medical history significant for chronic diastolic heart failure, type 2 diabetes, hypertension, peripheral artery disease with recurrent lower extremity foot infections and tobacco abuse who presents to the emergency department on 11/22 with right foot osteomyelitis. S/p irrigation and debridement by orhtopedic surgery 11/25.    Clinical Impression   PTA patient independent. Admitted for above and limited by problem list below, including NWB R LE, decreased activity tolerance affecting independence with ADLs.  Patient currently requires min assist for LB ADLs, toileting, and min-mod assist for lateral scoot transfers. Reviewed compensatory techniques for ADLs, safety and recommendations.  Pt reports she has 24/7 support and who can physically assist as needed.  She will benefit from continued OT services while admitted and after dc at [facility] level in order to optimize independence and safety with ADLs.  She will need a DROP ARM 3:1 bedside commode at dc.     Follow Up Recommendations  Home health OT;Supervision/Assistance - 24 hour    Equipment Recommendations  3 in 1 bedside commode;Wheelchair cushion (measurements OT);Wheelchair (measurements OT)(drop arm 3:1 bedside commode)    Recommendations for Other Services PT consult     Precautions / Restrictions Precautions Precautions: Fall Restrictions Weight Bearing Restrictions: Yes RLE Weight Bearing: Non weight bearing Other Position/Activity Restrictions: "strict NWB"      Mobility Bed Mobility Overal bed mobility: Modified Independent             General bed mobility comments: HOB elevated; increased time  Transfers Overall transfer level: Needs assistance   Transfers: Lateral/Scoot Transfers          Lateral/Scoot Transfers: Min assist;Mod assist General transfer comment: lateral scoot to drop arm recliner with min to mod assist (initally stuck on soiled bed--purewick leaking and required increased assist)     Balance Overall balance assessment: Needs assistance Sitting-balance support: No upper extremity supported;Feet supported Sitting balance-Leahy Scale: Good                                     ADL either performed or assessed with clinical judgement   ADL Overall ADL's : Needs assistance/impaired     Grooming: Supervision/safety;Set up;Sitting   Upper Body Bathing: Set up;Sitting   Lower Body Bathing: Minimal assistance;Sitting/lateral leans;Cueing for compensatory techniques   Upper Body Dressing : Set up;Sitting   Lower Body Dressing: Minimal assistance;Sitting/lateral leans;Cueing for compensatory techniques Lower Body Dressing Details (indicate cue type and reason): able to manage L sock, min assist for R LE; reviewed lateral lean techniques for clothing mgmt and discussed modified clothing for ease Toilet Transfer: Minimal assistance;Moderate assistance Toilet Transfer Details (indicate cue type and reason): lateral scoot simulated to recliner  Toileting- Clothing Manipulation and Hygiene: Min guard Toileting - Clothing Manipulation Details (indicate cue type and reason): reviewed lateral lean technique seated, complete for hygiene after purewick leaking; would need assist with clothing mgmt      Functional mobility during ADLs: Minimal assistance;Moderate assistance General ADL Comments: pt limited by NWB R LE, body habitus; reviewed lateral lean techniques, compensatory strategies and modified clothing to increase ease with ADLs     Vision   Vision Assessment?: No apparent visual deficits     Perception     Praxis  Pertinent Vitals/Pain Pain Assessment: Faces Faces Pain Scale: Hurts a little bit Pain Location: right foot Pain  Descriptors / Indicators: Guarding Pain Intervention(s): Monitored during session;Repositioned     Hand Dominance Right   Extremity/Trunk Assessment Upper Extremity Assessment Upper Extremity Assessment: Overall WFL for tasks assessed   Lower Extremity Assessment Lower Extremity Assessment: Defer to PT evaluation   Cervical / Trunk Assessment Cervical / Trunk Assessment: Normal   Communication Communication Communication: No difficulties   Cognition Arousal/Alertness: Awake/alert Behavior During Therapy: WFL for tasks assessed/performed Overall Cognitive Status: Within Functional Limits for tasks assessed                                     General Comments  VSS    Exercises     Shoulder Instructions      Home Living Family/patient expects to be discharged to:: Private residence Living Arrangements: Children;Non-relatives/Friends(daughter/roomate ) Available Help at Discharge: Family;Available 24 hours/day Type of Home: Apartment Home Access: Level entry     Home Layout: One level     Bathroom Shower/Tub: Teacher, early years/pre: Standard Bathroom Accessibility: Yes How Accessible: Accessible via walker Home Equipment: None          Prior Functioning/Environment Level of Independence: Independent        Comments: driving, independent, not working         OT Problem List: Decreased strength;Decreased activity tolerance;Impaired balance (sitting and/or standing);Decreased knowledge of use of DME or AE;Decreased knowledge of precautions      OT Treatment/Interventions: Self-care/ADL training;DME and/or AE instruction;Therapeutic activities;Balance training;Patient/family education    OT Goals(Current goals can be found in the care plan section) Acute Rehab OT Goals Patient Stated Goal: home soon OT Goal Formulation: With patient Time For Goal Achievement: 08/28/19 Potential to Achieve Goals: Good ADL Goals Pt Will Perform  Lower Body Dressing: with modified independence;sitting/lateral leans;with adaptive equipment Pt Will Transfer to Toilet: with modified independence;bedside commode Pt Will Perform Toileting - Clothing Manipulation and hygiene: with modified independence;sitting/lateral leans Additional ADL Goal #1: Pt will adhere to NWB precautions with independence during ADl routine.  OT Frequency: Min 2X/week   Barriers to D/C:            Co-evaluation              AM-PAC OT "6 Clicks" Daily Activity     Outcome Measure Help from another person eating meals?: None Help from another person taking care of personal grooming?: A Little Help from another person toileting, which includes using toliet, bedpan, or urinal?: A Little Help from another person bathing (including washing, rinsing, drying)?: A Little Help from another person to put on and taking off regular upper body clothing?: None Help from another person to put on and taking off regular lower body clothing?: A Little 6 Click Score: 20   End of Session Equipment Utilized During Treatment: Gait belt Nurse Communication: Mobility status  Activity Tolerance: Patient tolerated treatment well Patient left: in chair;with call bell/phone within reach  OT Visit Diagnosis: Other abnormalities of gait and mobility (R26.89);Pain Pain - Right/Left: Right Pain - part of body: Ankle and joints of foot                Time: 1002-1031 OT Time Calculation (min): 29 min Charges:  OT General Charges $OT Visit: 1 Visit OT Evaluation $OT Eval Moderate Complexity: 1 Mod OT Treatments $Self  Care/Home Management : 8-22 mins  Delight Stare, [place] Acute Rehabilitation Services Pager [phone] Office [phone]   Delight Stare 08/14/2019, 10:43 AM

## 2019-08-14 NOTE — TOC Transition Note (Addendum)
Transition of Care [facility]) - CM/SW Discharge Note   Patient Details  Name: SHANNIA RABALAIS MRN: [id number] Date of Birth: Apr 30, 1966  Transition of Care [facility]) CM/SW Contact:  Carles Collet, RN Phone Number: 08/14/2019, 11:30 AM   Clinical Narrative:    Patient to DC to home. Lives w daughter and roommate. She will be DC'd with [facility], managed by bedside RN. HH has been arranged through [facility], WC and 3/1 to be delivered to her room prior to DC through [facility]. No other CM needs identified.   [facility] not able to supply WC. Referral made to [facility] who will be bringing it to patient room. Patient updated.   [facility] out of 3/1s, will ship it to her house, patient aware and agreeable.    Final next level of care: [place] Barriers to Discharge: No Barriers Identified   Patient Goals and CMS Choice Patient states their goals for this hospitalization and ongoing recovery are:: to go home CMS Medicare.gov Compare Post Acute Care list provided to:: Patient Choice offered to / list presented to : Patient  Discharge Placement                       Discharge Plan and Services                DME Arranged: 3-N-1, Wheelchair manual DME Agency: [facility] Date DME Agency Contacted: 08/14/19 Time DME Agency Contacted: 71 Representative spoke with at DME Agency: [place]: PT, OT [place] Agency: [place] ([place]) Date [place]: 08/14/19 Time [place]: 1130 Representative spoke with at [place]: [place] ([place]) Interventions     Readmission Risk Interventions No flowsheet data found.

## 2019-08-14 NOTE — Progress Notes (Signed)
Discharge instructions given to Natalie Contreras.  Discussed medication changes, new medications and side effects.  Discussed activities and signs and symptoms to watch for and when to contact the physician.  Discussed follow up appointments.  Verbalized understanding.

## 2019-08-14 NOTE — Care Management (Signed)
    Durable Medical Equipment  (From admission, onward)         Start     Ordered   08/14/19 1059  For home use only DME 3 n 1  Once     08/14/19 1100   08/14/19 1059  For home use only DME lightweight manual wheelchair with seat cushion  Once    Comments: Patient suffers from wound and weakness which impairs their ability to perform daily activities like walking in the home.  A walker will not resolve  issue with performing activities of daily living. A lightweight wheelchair will allow patient to safely perform daily activities. Patient is not able to propel themselves in the home using a standard weight wheelchair due to weakness. Patient can self propel in the lightweight wheelchair. Length of need lifetime Accessories: elevating leg rests (ELRs), wheel locks, extensions and anti-tippers.   08/14/19 1100

## 2019-08-14 NOTE — Discharge Summary (Signed)
Physician Discharge Summary  Natalie Contreras [id number] DOB: Apr 07, 1966 DOA: 08/08/2019  PCP: Celene Squibb, MD  Admit date: 08/08/2019 Discharge date: 08/14/2019  Admitted From: Home Disposition:  Home with home health   Recommendations for Outpatient Follow-up:  1. Follow up with PCP in 1 week 2. Follow up with Dr. Sharol Given in 1 week   Discharge Condition: Stable CODE STATUS: Full  Diet recommendation: Carb modified   Brief/Interim Summary: Natalie Contreras is a 53 year old female with past medical history significant for chronic diastolic heart failure, type 2 diabetes, hypertension, peripheral artery disease with recurrent lower extremity foot infections and tobacco abuse who presents to the emergency department on 11/22 with worsening right foot wounds.  She underwent angiography with angioplasty by vascular surgery 11/24.  Underwent irrigation and debridement with Dr. Sharol Given on 11/25.   Discharge Diagnoses:  Principal Problem:   Abscess or cellulitis of foot Active Problems:   Benign essential HTN   Diabetic infection of right foot (HCC)   Tobacco abuse   PAD (peripheral artery disease) (HCC)   Chronic diastolic CHF (congestive heart failure) (HCC)   AKI (acute kidney injury) (HCC)   Subacute osteomyelitis, right ankle and foot (HCC)   Obesity (BMI 30-39.9)   Severe protein-calorie malnutrition ([place])   Gangrene of right foot ([place])   Right foot osteomyelitis -Failed outpatient antibiotic treatment (doxy, keflex, bactrim) -Sole of right foot wound with final wound cultures from 06/13/2019 with pansensitive MSSA and abundant Finegoldia Magna  -Status post angioplasty by vascular surgery 11/24 -Irrigation and debridement by orthopedic surgery 11/25 -Recommended 6 weeks antibiotics based on tissue cultures obtained intraoperatively, strict nonweightbearing right lower extremity, follow-up in office in 1 week with Dr. Sharol Given -Wound VAC in place -Culture positive for GBS and  enterobacter. Discussed with pharmacy. Discharge on levaquin. Discussed with patient to watch for severe diarrhea for C Diff and seek care if diarrhea, abdominal pain, fever presents   PAD -Status post angioplasty -Continue aspirin, Plavix, Zocor  Acute kidney injury on CKD stage IIIa  -Cr on admission 1.73 (baseline ~1.2) -Holding metformin and torsemide during hospitalization, resume on discharge  -Resolved creatinine back to baseline  DM2 uncontrolled with hyperglycemia -Recent A1c 10.7 -Hold Metformin and Jardiance -Continue Levemir, sliding scale insulin  HTN -Continue Coreg   Chronic diastolic heart failure -Stable  Tobacco abuse  -Counseled against further tobacco use -Nicotine patch   Discharge Instructions  Discharge Instructions    Call MD for:  difficulty breathing, headache or visual disturbances   Complete by: As directed    Call MD for:  extreme fatigue   Complete by: As directed    Call MD for:  persistant dizziness or light-headedness   Complete by: As directed    Call MD for:  persistant nausea and vomiting   Complete by: As directed    Call MD for:  severe uncontrolled pain   Complete by: As directed    Call MD for:  temperature >100.4   Complete by: As directed    Diet Carb Modified   Complete by: As directed    Discharge instructions   Complete by: As directed    You were cared for by a hospitalist during your hospital stay. If you have any questions about your discharge medications or the care you received while you were in the hospital after you are discharged, you can call the unit and ask to speak with the hospitalist on call if the hospitalist that took care of you is  not available. Once you are discharged, your primary care physician will handle any further medical issues. Please note that NO REFILLS for any discharge medications will be authorized once you are discharged, as it is imperative that you return to your primary care physician  (or establish a relationship with a primary care physician if you do not have one) for your aftercare needs so that they can reassess your need for medications and monitor your lab values.   Increase activity slowly   Complete by: As directed    Leave dressing on - Keep it clean, dry, and intact until clinic visit   Complete by: As directed    Neg Press Wound Therapy / Incisional   Complete by: As directed    Please instruct patient how to attach prevena pump . Will be removed in office   Other Restrictions   Complete by: As directed    Nonweight bearing right lower extremity     Allergies as of 08/14/2019   No Known Allergies     Medication List    TAKE these medications   acetaminophen 325 MG tablet Commonly known as: TYLENOL Take 2 tablets (650 mg total) by mouth every 6 (six) hours as needed for mild pain, fever or headache (or Fever >/= 101).   aspirin EC 81 MG tablet Take 1 tablet (81 mg total) by mouth daily with breakfast.   carvedilol 3.125 MG tablet Commonly known as: COREG Take 1 tablet (3.125 mg total) by mouth 2 (two) times daily with a meal.   insulin detemir 100 UNIT/ML injection Commonly known as: LEVEMIR Inject 35 Units into the skin at bedtime.   Jardiance 25 MG Tabs tablet Generic drug: empagliflozin Take 25 mg by mouth daily.   levofloxacin 750 MG tablet Commonly known as: Levaquin Take 1 tablet (750 mg total) by mouth daily.   metFORMIN 500 MG tablet Commonly known as: GLUCOPHAGE Take 1,000 mg by mouth daily.   oxyCODONE 5 MG immediate release tablet Commonly known as: Oxy IR/ROXICODONE Take 5-10 mg by mouth every 4 (four) hours as needed for moderate pain.   simvastatin 40 MG tablet Commonly known as: ZOCOR Take 40 mg by mouth daily.   torsemide 20 MG tablet Commonly known as: DEMADEX Take 1 tablet (20 mg total) by mouth daily.      Follow-up Information    Newt Minion, MD In 1 week.   Specialty: Orthopedic Surgery Contact  information: [place]  [address] [phone]          No Known Allergies   Procedures/Studies: Mr Foot Right W Wo Contrast  Result Date: 08/09/2019 CLINICAL DATA:  Diabetes and foot swelling, possible osteomyelitis. Neuropathy. EXAM: MRI OF THE RIGHT FOREFOOT WITHOUT AND WITH CONTRAST TECHNIQUE: Multiplanar, multisequence MR imaging of the right forefoot was performed before and after the administration of intravenous contrast. CONTRAST:  43mL GADAVIST GADOBUTROL 1 MMOL/ML IV SOLN COMPARISON:  None. FINDINGS: Bones/Joint/Cartilage Degenerative arthropathy at the calcaneocuboid joint with subcortical marrow edema spurring as well as a small calcaneocuboid joint effusion. Ligaments The Lisfranc ligament appears intact. Muscles and Tendons There is atrophy and edema the abductor digiti minimi muscle and flexor digiti minimi brevis muscle likely related to cutaneous ulceration along the plantar foot and overlying lateral ulceration adjacent to base of the fifth metatarsal. No appreciable drainable abscess. Low-level edema tracks along the remaining plantar musculature of the foot. Soft tissues Cutaneous ulceration along the plantar foot below the base of the fifth metatarsal, and  also lateral to the base of the fifth metatarsal. IMPRESSION: 1. Cutaneous ulceration along the plantar foot and lateral to the base of the fifth metatarsal. No drainable abscess or osteomyelitis. 2. Atrophy and edema in the abductor digiti minimi and flexor digiti minimi brevis muscle, likely from myositis. 3. 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 Electronically Signed   By: Van Clines M.D.   On: 08/09/2019 09:56   Dg Foot Complete Right  Result Date: 08/08/2019 CLINICAL DATA:  Wounds on right foot for the past month. EXAM: RIGHT FOOT COMPLETE - 3+ VIEW COMPARISON:  07/21/2019 FINDINGS: Diffuse soft tissue swelling. Soft tissue defect about the plantar surface  of the midfoot on the lateral view. Soft tissue defect about the lateral mid foot on the AP and oblique views may represent the same area as on the lateral. Small Achilles and calcaneal spurs.  Vascular calcifications. No osseous destruction.  Midfoot degenerative changes. IMPRESSION: Soft tissue swelling with at least 1 soft tissue ulcer about the plantar midfoot as detailed above. No plain film evidence of osteomyelitis. Electronically Signed   By: Abigail Miyamoto M.D.   On: 08/08/2019 09:22   Dg Foot Complete Right  Result Date: 07/21/2019 CLINICAL DATA:  Right foot pain and swelling with nonhealing soft tissue. EXAM: RIGHT FOOT COMPLETE - 3+ VIEW COMPARISON:  Radiographs dated 06/13/2019 FINDINGS: There is no evidence of osteomyelitis. There are prominent degenerative changes in the midfoot, stable. There is an old fracture of the midshaft of the proximal phalanx of the third toe. There is soft tissue swelling diffusely in the foot and around the ankle. IMPRESSION: 1. No evidence of osteomyelitis. 2. Soft tissue swelling. 3. Stable degenerative changes in the midfoot. Electronically Signed   By: Lorriane Shire M.D.   On: 07/21/2019 10:28   Vas [place] Burnard Bunting With/wo Tbi  Result Date: 08/09/2019 LOWER EXTREMITY DOPPLER STUDY Indications: Rest pain, and peripheral artery disease. High Risk Factors: Hypertension, Diabetes.  Comparison Study: no prior Performing Technologist: Abram Sander RVS  Examination Guidelines: A complete evaluation includes at minimum, Doppler waveform signals and systolic blood pressure reading at the level of bilateral brachial, anterior tibial, and posterior tibial arteries, when vessel segments are accessible. Bilateral testing is considered an integral part of a complete examination. Photoelectric Plethysmograph (PPG) waveforms and toe systolic pressure readings are included as required and additional duplex testing as needed. Limited examinations for reoccurring indications may be  performed as noted.  ABI Findings: +--------+------------------+-----+----------+-----------+ Right   Rt Pressure (mmHg)IndexWaveform  Comment     +--------+------------------+-----+----------+-----------+ [id number]                    triphasic             +--------+------------------+-----+----------+-----------+ PTA                                      not audible +--------+------------------+-----+----------+-----------+ DP      58                0.34 monophasic            +--------+------------------+-----+----------+-----------+ +--------+------------------+-----+---------+-------+ Left    Lt Pressure (mmHg)IndexWaveform Comment +--------+------------------+-----+---------+-------+ [id number]                    triphasic        +--------+------------------+-----+---------+-------+ PTA     105  0.62 biphasic         +--------+------------------+-----+---------+-------+ DP      107               0.63 biphasic         +--------+------------------+-----+---------+-------+ +-------+-----------+-----------+------------+------------+ ABI/TBIToday's ABIToday's TBIPrevious ABIPrevious TBI +-------+-----------+-----------+------------+------------+ Right  0.34                                           +-------+-----------+-----------+------------+------------+ Left   0.63                                           +-------+-----------+-----------+------------+------------+  Summary: Right: Resting right ankle-brachial index indicates severe right lower extremity arterial disease. Left: Resting left ankle-brachial index indicates moderate left lower extremity arterial disease.  *See table(s) above for measurements and observations.  Electronically signed by Curt Jews MD on 08/09/2019 at 1:34:25 PM.   Final        Discharge Exam: Vitals:   08/13/19 2013 08/14/19 0516  BP: (!) 150/48 (!) 155/53  Pulse: 74 70  Resp:    Temp: 98.3 F  (36.8 C) 97.7 F (36.5 C)  SpO2: 92% 90%     General: Pt is alert, awake, not in acute distress Cardiovascular: RRR, S1/S2 +, no edema Respiratory: CTA bilaterally, no wheezing, no rhonchi, no respiratory distress, no conversational dyspnea  Abdominal: Soft, NT, ND, bowel sounds + Extremities: Right lower extremity with wound vac  Psych: Normal mood and affect, stable judgement and insight     The results of significant diagnostics from this hospitalization (including imaging, microbiology, ancillary and laboratory) are listed below for reference.     Microbiology: Recent Results (from the past 240 hour(s))  SARS CORONAVIRUS 2 (TAT 6-24 HRS) Nasopharyngeal Nasopharyngeal Swab     Status: None   Collection Time: 08/08/19 11:34 AM   Specimen: Nasopharyngeal Swab  Result Value Ref Range Status   SARS Coronavirus 2 NEGATIVE NEGATIVE Final    Comment: (NOTE) SARS-CoV-2 target nucleic acids are NOT DETECTED. The SARS-CoV-2 RNA is generally detectable in upper and lower respiratory specimens during the acute phase of infection. Negative results do not preclude SARS-CoV-2 infection, do not rule out co-infections with other pathogens, and should not be used as the sole basis for treatment or other patient management decisions. Negative results must be combined with clinical observations, patient history, and epidemiological information. The expected result is Negative. Fact Sheet for Patients: SugarRoll.be Fact Sheet for Healthcare Providers: https://www.woods-mathews.com/ This test is not yet approved or cleared by the [place] FDA and  has been authorized for detection and/or diagnosis of SARS-CoV-2 by FDA under an Emergency Use Authorization (EUA). This EUA will remain  in effect (meaning this test can be used) for the duration of the COVID-19 declaration under Section 56 4(b)(1) of the Act, 21 U.S.C. section 360bbb-3(b)(1), unless  the authorization is terminated or revoked sooner. Performed at [facility], [place] [address] [place], [place] [address]   Surgical pcr screen     Status: None   Collection Time: 08/11/19  4:04 AM   Specimen: Nasal Mucosa; Nasal Swab  Result Value Ref Range Status   MRSA, PCR NEGATIVE NEGATIVE Final   Staphylococcus aureus NEGATIVE NEGATIVE Final    Comment: (NOTE) The Xpert SA  Assay (FDA approved for NASAL specimens in patients 49 years of age and older), is one component of a comprehensive surveillance program. It is not intended to diagnose infection nor to guide or monitor treatment. Performed at [facility], [place] [address], [place], [place] [address]   Aerobic/Anaerobic Culture (surgical/deep wound)     Status: None (Preliminary result)   Collection Time: 08/11/19 10:22 AM   Specimen: Wound  Result Value Ref Range Status   Specimen Description TISSUE RIGHT FOOT  Final   Special Requests NONE  Final   Gram Stain   Final    FEW WBC PRESENT, PREDOMINANTLY PMN MODERATE GRAM POSITIVE COCCI IN PAIRS RARE GRAM NEGATIVE RODS    Culture   Final    MODERATE GROUP B STREP(S.AGALACTIAE)ISOLATED TESTING AGAINST S. AGALACTIAE NOT ROUTINELY PERFORMED DUE TO PREDICTABILITY OF AMP/PEN/VAN SUSCEPTIBILITY. RARE ENTEROBACTER AEROGENES HOLDING FOR POSSIBLE ANAEROBE Performed at [facility], [place] [address], [place], [place] [address]    Report Status PENDING  Incomplete   Organism ID, Bacteria ENTEROBACTER AEROGENES  Final      Susceptibility   Enterobacter aerogenes - MIC*    CEFAZOLIN >=64 RESISTANT Resistant     CEFEPIME <=1 SENSITIVE Sensitive     CEFTAZIDIME <=1 SENSITIVE Sensitive     CEFTRIAXONE <=1 SENSITIVE Sensitive     CIPROFLOXACIN <=0.25 SENSITIVE Sensitive     GENTAMICIN <=1 SENSITIVE Sensitive     IMIPENEM 1 SENSITIVE Sensitive     TRIMETH/SULFA <=20 SENSITIVE Sensitive     PIP/TAZO 8 SENSITIVE Sensitive     * RARE ENTEROBACTER AEROGENES      Labs: BNP (last 3 results) No results for input(s): BNP in the last 8760 hours. Basic Metabolic Panel: Recent Labs  Lab 08/10/19 0300 08/11/19 0522 08/12/19 0235 08/13/19 0224 08/14/19 0251  NA 134* 133* 138 138 141  K 3.4* 4.1 4.8 4.0 4.4  CL 101 101 105 107 111  CO2 25 23 23 22 23   GLUCOSE 138* 236* 166* 135* 111*  BUN 33* 32* 31* 34* 32*  CREATININE 1.71* 1.57* 1.21* 1.29* 1.20*  CALCIUM 8.1* 8.1* 8.5* 8.4* 8.4*  MG 2.2 2.1  --   --   --   PHOS 3.2 2.6  --   --   --    Liver Function Tests: Recent Labs  Lab 08/10/19 0300 08/11/19 0522  ALBUMIN 1.8* 1.7*   No results for input(s): LIPASE, AMYLASE in the last 168 hours. No results for input(s): AMMONIA in the last 168 hours. CBC: Recent Labs  Lab 08/08/19 0839 08/09/19 0713 08/10/19 0300 08/11/19 0522  WBC 11.5* 8.3 9.8 10.2  NEUTROABS 9.2*  --  6.1 8.0*  HGB 10.6* 10.2* 9.0* 9.3*  HCT 34.5* 33.3* 29.5* 29.8*  MCV 87.6 86.9 87.3 86.1  PLT 508* 453* 411* 380   Cardiac Enzymes: No results for input(s): CKTOTAL, CKMB, CKMBINDEX, TROPONINI in the last 168 hours. BNP: Invalid input(s): POCBNP CBG: Recent Labs  Lab 08/13/19 0554 08/13/19 1125 08/13/19 1642 08/13/19 2116 08/14/19 0605  GLUCAP 130* 88 141* 177* 118*   D-Dimer No results for input(s): DDIMER in the last 72 hours. Hgb A1c No results for input(s): HGBA1C in the last 72 hours. Lipid Profile No results for input(s): CHOL, HDL, LDLCALC, TRIG, CHOLHDL, LDLDIRECT in the last 72 hours. Thyroid function studies No results for input(s): TSH, T4TOTAL, T3FREE, THYROIDAB in the last 72 hours.  Invalid input(s): FREET3 Anemia work up No results for input(s): VITAMINB12, FOLATE, FERRITIN,  TIBC, IRON, RETICCTPCT in the last 72 hours. Urinalysis    Component Value Date/Time   COLORURINE STRAW (A) 04/07/2017 1027   APPEARANCEUR CLEAR 04/07/2017 1027   LABSPEC 1.006 04/07/2017 1027   PHURINE 5.0 04/07/2017 1027   GLUCOSEU NEGATIVE 04/07/2017  1027   HGBUR NEGATIVE 04/07/2017 1027   BILIRUBINUR NEGATIVE 04/07/2017 1027   KETONESUR NEGATIVE 04/07/2017 1027   PROTEINUR NEGATIVE 04/07/2017 1027   NITRITE NEGATIVE 04/07/2017 1027   LEUKOCYTESUR NEGATIVE 04/07/2017 1027   Sepsis Labs Invalid input(s): PROCALCITONIN,  WBC,  LACTICIDVEN Microbiology Recent Results (from the past 240 hour(s))  SARS CORONAVIRUS 2 (TAT 6-24 HRS) Nasopharyngeal Nasopharyngeal Swab     Status: None   Collection Time: 08/08/19 11:34 AM   Specimen: Nasopharyngeal Swab  Result Value Ref Range Status   SARS Coronavirus 2 NEGATIVE NEGATIVE Final    Comment: (NOTE) SARS-CoV-2 target nucleic acids are NOT DETECTED. The SARS-CoV-2 RNA is generally detectable in upper and lower respiratory specimens during the acute phase of infection. Negative results do not preclude SARS-CoV-2 infection, do not rule out co-infections with other pathogens, and should not be used as the sole basis for treatment or other patient management decisions. Negative results must be combined with clinical observations, patient history, and epidemiological information. The expected result is Negative. Fact Sheet for Patients: SugarRoll.be Fact Sheet for Healthcare Providers: https://www.woods-mathews.com/ This test is not yet approved or cleared by the [place] FDA and  has been authorized for detection and/or diagnosis of SARS-CoV-2 by FDA under an Emergency Use Authorization (EUA). This EUA will remain  in effect (meaning this test can be used) for the duration of the COVID-19 declaration under Section 56 4(b)(1) of the Act, 21 U.S.C. section 360bbb-3(b)(1), unless the authorization is terminated or revoked sooner. Performed at [facility], [place] [address], [place], [place] [address]   Surgical pcr screen     Status: None   Collection Time: 08/11/19  4:04 AM   Specimen: Nasal Mucosa; Nasal Swab  Result Value Ref Range  Status   MRSA, PCR NEGATIVE NEGATIVE Final   Staphylococcus aureus NEGATIVE NEGATIVE Final    Comment: (NOTE) The Xpert SA Assay (FDA approved for NASAL specimens in patients 89 years of age and older), is one component of a comprehensive surveillance program. It is not intended to diagnose infection nor to guide or monitor treatment. Performed at [facility], [place] [address] [place], [place] [address]   Aerobic/Anaerobic Culture (surgical/deep wound)     Status: None (Preliminary result)   Collection Time: 08/11/19 10:22 AM   Specimen: Wound  Result Value Ref Range Status   Specimen Description TISSUE RIGHT FOOT  Final   Special Requests NONE  Final   Gram Stain   Final    FEW WBC PRESENT, PREDOMINANTLY PMN MODERATE GRAM POSITIVE COCCI IN PAIRS RARE GRAM NEGATIVE RODS    Culture   Final    MODERATE GROUP B STREP(S.AGALACTIAE)ISOLATED TESTING AGAINST S. AGALACTIAE NOT ROUTINELY PERFORMED DUE TO PREDICTABILITY OF AMP/PEN/VAN SUSCEPTIBILITY. RARE ENTEROBACTER AEROGENES HOLDING FOR POSSIBLE ANAEROBE Performed at [facility], [place] [address] [place], [place] [address]    Report Status PENDING  Incomplete   Organism ID, Bacteria ENTEROBACTER AEROGENES  Final      Susceptibility   Enterobacter aerogenes - MIC*    CEFAZOLIN >=64 RESISTANT Resistant     CEFEPIME <=1 SENSITIVE Sensitive     CEFTAZIDIME <=1 SENSITIVE Sensitive     CEFTRIAXONE <=1 SENSITIVE Sensitive  CIPROFLOXACIN <=0.25 SENSITIVE Sensitive     GENTAMICIN <=1 SENSITIVE Sensitive     IMIPENEM 1 SENSITIVE Sensitive     TRIMETH/SULFA <=20 SENSITIVE Sensitive     PIP/TAZO 8 SENSITIVE Sensitive     * RARE ENTEROBACTER AEROGENES     Patient was seen and examined on the day of discharge and was found to be in stable condition. Time coordinating discharge: 35 minutes including assessment and coordination of care, as well as examination of the patient.   SIGNED:  Dessa Phi, DO [facility] 08/14/2019, 9:27 AM

## 2019-08-15 LAB — AEROBIC/ANAEROBIC CULTURE W GRAM STAIN (SURGICAL/DEEP WOUND)

## 2019-08-18 ENCOUNTER — Encounter (HOSPITAL_COMMUNITY): Payer: Self-pay

## 2019-08-18 ENCOUNTER — Ambulatory Visit (HOSPITAL_COMMUNITY): Payer: Medicaid Other

## 2019-08-20 ENCOUNTER — Other Ambulatory Visit: Payer: Self-pay

## 2019-08-20 ENCOUNTER — Ambulatory Visit (INDEPENDENT_AMBULATORY_CARE_PROVIDER_SITE_OTHER): Payer: Medicaid Other | Admitting: Family

## 2019-08-20 ENCOUNTER — Encounter: Payer: Self-pay | Admitting: Family

## 2019-08-20 VITALS — Ht 64.0 in | Wt 212.0 lb

## 2019-08-20 DIAGNOSIS — L03119 Cellulitis of unspecified part of limb: Secondary | ICD-10-CM

## 2019-08-20 DIAGNOSIS — Z945 Skin transplant status: Secondary | ICD-10-CM | POA: Insufficient documentation

## 2019-08-20 DIAGNOSIS — IMO0002 Reserved for concepts with insufficient information to code with codable children: Secondary | ICD-10-CM

## 2019-08-20 DIAGNOSIS — L02619 Cutaneous abscess of unspecified foot: Secondary | ICD-10-CM

## 2019-08-20 MED ORDER — SILVER SULFADIAZINE 1 % EX CREA: 1.0000 "application " | TOPICAL_CREAM | Freq: Every day | CUTANEOUS | 0 refills | Status: DC

## 2019-08-20 NOTE — Progress Notes (Signed)
Post-Op Visit Note   Patient: Natalie Contreras           Date of Birth: 11/11/1965           MRN: [id number] Visit Date: 08/20/2019 PCP: Celene Squibb, MD  Chief Complaint:  Chief Complaint  Patient presents with  . Right Foot - Routine Post Op    08/11/2019 I&D right foot    HPI:  HPI The patient is a 53 year old woman who presents today status post irrigation debridement of the right foot with split thickness skin grafting on November 25.  She is not had any pain or issues that she can think of.  Her sutures are in place the graft is intact the wound VAC was removed today Ortho Exam On examination of the right foot there is moderate edema with maceration to the plantar aspect of her foot sutures are in place holding the graft there is no active drainage no surrounding erythema or sign of infection  Visit Diagnoses:  1. Abscess or cellulitis of foot   2. Status post skin graft     Plan: Begin daily Dial soap cleansing.  Followed by Silvadene dressings.  Discussed importance of elevation and nonweightbearing she will follow-up in 1 week  Follow-Up Instructions: Return in about 1 week (around 08/27/2019).   Imaging: No results found.  Orders:  No orders of the defined types were placed in this encounter.  No orders of the defined types were placed in this encounter.    PMFS History: Patient Active Problem List   Diagnosis Date Noted  . 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 Severe protein-calorie malnutrition ([place])   . Gangrene of right foot ([place])   . Abscess or cellulitis of foot 08/08/2019  . Obesity (BMI 30-39.9) 08/08/2019  . Wound infection 06/13/2019  . Cellulitis 07/17/2017  . Subacute osteomyelitis, right ankle and foot ([place])   . Toe amputation status, left 05/05/2017  . Gangrene of left foot ([place])   . PAD (peripheral artery disease) ([place]) 04/10/2017  . Essential hypertension 04/10/2017  . Chronic diastolic CHF (congestive heart failure) ([place]) 04/10/2017   . AKI (acute kidney injury) ([place]) 04/10/2017  . Foreign body in left foot   . Cellulitis and abscess of foot 04/07/2017  . Carotid stenosis, asymptomatic, bilateral 01/15/2017  . Visual disturbance 01/15/2017  . Tobacco abuse 01/15/2017  . Acute respiratory failure with hypoxia ([place]) 10/30/2016  . Pulmonary hypertension ([place]) 10/29/2016  . Acute diastolic CHF (congestive heart failure) ([place]) 10/28/2016  . Benign essential HTN 10/28/2016  . Hyperlipidemia 10/28/2016  . Diabetic infection of right foot ([place]) 10/28/2016   Past Medical History:  Diagnosis Date  . Abscess of foot 08/09/2019   WITH ULCER  RIGHT FOOT  . Acute diastolic CHF (congestive heart failure) ([place]) 10/28/2016  . Carotid stenosis, asymptomatic, bilateral 01/15/2017  . Diabetes mellitus without complication ([place])   . Heart murmur   . Hypertension   . PAD (peripheral artery disease) ([place]) 04/10/2017   Left ABI 0.74. Right ABI 0.96.    Family History  Problem Relation Age of Onset  . Lung cancer Mother   . Lung cancer Father   . AAA (abdominal aortic aneurysm) Brother     Past Surgical History:  Procedure Laterality Date  . ABDOMINAL AORTOGRAM W/LOWER EXTREMITY N/A 08/10/2019   Procedure: ABDOMINAL AORTOGRAM W/LOWER EXTREMITY;  Surgeon: Waynetta Sandy, MD;  Location: [facility];  Service: Cardiovascular;  Laterality: N/A;  . AMPUTATION TOE Left  05/05/2017   Procedure: LEFT SECOND TOE AMPUTATION;  Surgeon: Aviva Signs, MD;  Location[facility];  Service: General;  Laterality: Left;  . CATARACT EXTRACTION W/PHACO Left 02/08/2016   Procedure: CATARACT EXTRACTION PHACO AND INTRAOCULAR LENS PLACEMENT (IOC);  Surgeon: Tonny Branch, MD;  Location: [facility];  Service: Ophthalmology;  Laterality: Left;  CDE 11.43   . CATARACT EXTRACTION W/PHACO Right 02/26/2016   Procedure: CATARACT EXTRACTION PHACO AND INTRAOCULAR LENS PLACEMENT RIGHT; CDE:  16.90;  Surgeon: Tonny Branch, MD;  Location: [facility];  Service:  Ophthalmology;  Laterality: Right;  . CHOLECYSTECTOMY    . FOREIGN BODY REMOVAL Left 04/09/2017   Procedure: FOREIGN BODY REMOVAL ADULT FOOT;  Surgeon: Aviva Signs, MD;  Location: [facility];  Service: General;  Laterality: Left;  . I&D EXTREMITY Right 08/11/2019   Procedure: IRRIGATION AND DEBRIDEMENT EXTREMITY;  Surgeon: Newt Minion, MD;  Location: [facility];  Service: Orthopedics;  Laterality: Right;  . PERIPHERAL VASCULAR ATHERECTOMY Right 08/10/2019   Procedure: PERIPHERAL VASCULAR ATHERECTOMY;  Surgeon: Waynetta Sandy, MD;  Location: [facility];  Service: Cardiovascular;  Laterality: Right;  superficial femoral  . PERIPHERAL VASCULAR BALLOON ANGIOPLASTY Right 08/10/2019   Procedure: PERIPHERAL VASCULAR BALLOON ANGIOPLASTY;  Surgeon: Waynetta Sandy, MD;  Location: [facility];  Service: Cardiovascular;  Laterality: Right;  anterior tibial  . TRANSMETATARSAL AMPUTATION Left 07/21/2017   Procedure: TRANSMETATARSAL AMPUTATION LEFT FOOT;  Surgeon: Aviva Signs, MD;  [facility];  Service: General;  Laterality: Left;   Social History   Occupational History  . Not on file  Tobacco Use  . Smoking status: Current Every Day Smoker    Packs/day: 0.75    Years: 25.00    Pack years: 18.75    Types: Cigarettes    Start date: 09/16/1984  . Smokeless tobacco: Never Used  Substance and Sexual Activity  . Alcohol use: No  . Drug use: No  . Sexual activity: Yes    Birth control/protection: None

## 2019-08-25 ENCOUNTER — Telehealth: Payer: Self-pay | Admitting: Orthopedic Surgery

## 2019-08-25 NOTE — Telephone Encounter (Signed)
Received call from [facility] -nurse with [facility] home [facility] stating patient did not receive discharge paperwork with orders for wound care to right foot. Felicia asked that the order for wound care be called into her. The number to contact Solmon Ice is [phone]

## 2019-08-25 NOTE — Telephone Encounter (Signed)
I called Natalie Contreras and discussed patient's wound care changes and patient's weightbearing status( continue dial soap cleaning and silvadene dressing changes; non weightbearing). Natalie Contreras also informed me of an area on patients graft that could possible need attention on patient's foot. I tried to inform her that it will be checked on patient's arrival on 08/26/2019 before we got disconnected.

## 2019-08-26 ENCOUNTER — Ambulatory Visit (INDEPENDENT_AMBULATORY_CARE_PROVIDER_SITE_OTHER): Payer: Medicaid Other | Admitting: Orthopedic Surgery

## 2019-08-26 ENCOUNTER — Encounter: Payer: Self-pay | Admitting: Orthopedic Surgery

## 2019-08-26 ENCOUNTER — Other Ambulatory Visit: Payer: Self-pay

## 2019-08-26 VITALS — Ht 64.0 in | Wt 212.0 lb

## 2019-08-26 DIAGNOSIS — L02619 Cutaneous abscess of unspecified foot: Secondary | ICD-10-CM

## 2019-08-26 DIAGNOSIS — L03119 Cellulitis of unspecified part of limb: Secondary | ICD-10-CM

## 2019-08-26 DIAGNOSIS — IMO0002 Reserved for concepts with insufficient information to code with codable children: Secondary | ICD-10-CM

## 2019-08-26 NOTE — Progress Notes (Signed)
Office Visit Note   Patient: Natalie Contreras           Date of Birth: February 28, 1966           MRN: [id number] Visit Date: 08/26/2019              Requested by: Celene Squibb, MD [place],  [place] [id number] PCP: Celene Squibb, MD  Chief Complaint  Patient presents with  . Right Foot - Routine Post Op    08/11/19 I&D right foot       HPI: This is a pleasant woman who is now 2 weeks status post irrigation and debridement with skin grafting of her foot both on the dorsum and plantar surface she has been compliant with her weightbearing restrictions her pain is well controlled  Assessment & Plan: Visit Diagnoses: No diagnosis found.  Plan: She should focus on elevating her leg.  She is to remain nonweightbearing.  She will follow up in 1 week.  Continue with Silvadene dressing changes  Follow-Up Instructions: No follow-ups on file.   Ortho Exam  Patient is alert, oriented, no adenopathy, well-dressed, normal affect, normal respiratory effort. Focused examination of her right foot demonstrates incorporating graft good granulation tissue no evidence of necrosis no odor no surrounding cellulitis.  Of note she does have some erythema on the distal tibia but she states she has had that her whole life and is not related to her current surgery  Imaging: No results found. No images are attached to the encounter.  Labs: Lab Results  Component Value Date   HGBA1C 10.7 (H) 06/13/2019   HGBA1C 7.1 (H) 07/19/2017   HGBA1C 7.0 (H) 05/01/2017   ESRSEDRATE 122 (H) 08/08/2019   CRP 11.7 (H) 08/08/2019   REPTSTATUS 08/15/2019 FINAL 08/11/2019   GRAMSTAIN  08/11/2019    FEW WBC PRESENT, PREDOMINANTLY PMN MODERATE GRAM POSITIVE COCCI IN PAIRS RARE GRAM NEGATIVE RODS    CULT  08/11/2019    MODERATE GROUP B STREP(S.AGALACTIAE)ISOLATED TESTING AGAINST S. AGALACTIAE NOT ROUTINELY PERFORMED DUE TO PREDICTABILITY OF AMP/PEN/VAN SUSCEPTIBILITY. RARE ENTEROBACTER AEROGENES  MODERATE PREVOTELLA BIVIA BETA LACTAMASE POSITIVE Performed at [facility], [place] [address] [place], [place] [address]    [place] AEROGENES 08/11/2019     Lab Results  Component Value Date   ALBUMIN 1.7 (L) 08/11/2019   ALBUMIN 1.8 (L) 08/10/2019   ALBUMIN 2.7 (L) 07/17/2017    Lab Results  Component Value Date   MG 2.1 08/11/2019   MG 2.2 08/10/2019   MG 2.2 03/04/2017   No results found for: VD25OH  No results found for: PREALBUMIN CBC EXTENDED Latest Ref Rng & Units 08/11/2019 08/10/2019 08/09/2019  WBC 4.0 - 10.5 K/uL 10.2 9.8 8.3  RBC 3.87 - 5.11 MIL/uL 3.46(L) 3.38(L) 3.83(L)  HGB 12.0 - 15.0 g/dL 9.3(L) 9.0(L) 10.2(L)  HCT 36.0 - 46.0 % 29.8(L) 29.5(L) 33.3(L)  PLT 150 - 400 K/uL 380 411(H) 453(H)  NEUTROABS 1.7 - 7.7 K/uL 8.0(H) 6.1 -  LYMPHSABS 0.7 - 4.0 K/uL 1.3 2.6 -     Body mass index is 36.39 kg/m.  Orders:  No orders of the defined types were placed in this encounter.  No orders of the defined types were placed in this encounter.    Procedures: No procedures performed  Clinical Data: No additional findings.  ROS:  All other systems negative, except as noted in the HPI. Review of Systems  Objective: Vital Signs: Ht 5\' 4"  (  1.626 m)   Wt 212 lb (96.2 kg)   LMP 12/16/2015   BMI 36.39 kg/m   Specialty Comments:  No specialty comments available.  PMFS History: Patient Active Problem List   Diagnosis Date Noted  . 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 Severe protein-calorie malnutrition ([place])   . Gangrene of right foot ([place])   . Abscess or cellulitis of foot 08/08/2019  . Obesity (BMI 30-39.9) 08/08/2019  . Wound infection 06/13/2019  . Cellulitis 07/17/2017  . Subacute osteomyelitis, right ankle and foot ([place])   . Toe amputation status, left 05/05/2017  . Gangrene of left foot ([place])   . PAD (peripheral artery disease) ([place]) 04/10/2017  . Essential hypertension 04/10/2017  . Chronic diastolic CHF (congestive  heart failure) ([place]) 04/10/2017  . AKI (acute kidney injury) ([place]) 04/10/2017  . Foreign body in left foot   . Cellulitis and abscess of foot 04/07/2017  . Carotid stenosis, asymptomatic, bilateral 01/15/2017  . Visual disturbance 01/15/2017  . Tobacco abuse 01/15/2017  . Acute respiratory failure with hypoxia ([place]) 10/30/2016  . Pulmonary hypertension ([place]) 10/29/2016  . Acute diastolic CHF (congestive heart failure) ([place]) 10/28/2016  . Benign essential HTN 10/28/2016  . Hyperlipidemia 10/28/2016  . Diabetic infection of right foot ([place]) 10/28/2016   Past Medical History:  Diagnosis Date  . Abscess of foot 08/09/2019   WITH ULCER  RIGHT FOOT  . Acute diastolic CHF (congestive heart failure) ([place]) 10/28/2016  . Carotid stenosis, asymptomatic, bilateral 01/15/2017  . Diabetes mellitus without complication ([place])   . Heart murmur   . Hypertension   . PAD (peripheral artery disease) ([place]) 04/10/2017   Left ABI 0.74. Right ABI 0.96.    Family History  Problem Relation Age of Onset  . Lung cancer Mother   . Lung cancer Father   . AAA (abdominal aortic aneurysm) Brother     Past Surgical History:  Procedure Laterality Date  . ABDOMINAL AORTOGRAM W/LOWER EXTREMITY N/A 08/10/2019   Procedure: ABDOMINAL AORTOGRAM W/LOWER EXTREMITY;  Surgeon: Waynetta Sandy, MD;  Location: [facility];  Service: Cardiovascular;  Laterality: N/A;  . AMPUTATION TOE Left 05/05/2017   Procedure: LEFT SECOND TOE AMPUTATION;  Surgeon: Aviva Signs, MD;  Location[facility];  Service: General;  Laterality: Left;  . CATARACT EXTRACTION W/PHACO Left 02/08/2016   Procedure: CATARACT EXTRACTION PHACO AND INTRAOCULAR LENS PLACEMENT (IOC);  Surgeon: Tonny Branch, MD;  Location: [facility];  Service: Ophthalmology;  Laterality: Left;  CDE 11.43   . CATARACT EXTRACTION W/PHACO Right 02/26/2016   Procedure: CATARACT EXTRACTION PHACO AND INTRAOCULAR LENS PLACEMENT RIGHT; CDE:  16.90;  Surgeon: Tonny Branch, MD;   Location[facility];  Service: Ophthalmology;  Laterality: Right;  . CHOLECYSTECTOMY    . FOREIGN BODY REMOVAL Left 04/09/2017   Procedure: FOREIGN BODY REMOVAL ADULT FOOT;  Surgeon: Aviva Signs, MD;  Location[facility];  Service: General;  Laterality: Left;  . I&D EXTREMITY Right 08/11/2019   Procedure: IRRIGATION AND DEBRIDEMENT EXTREMITY;  Surgeon: Newt Minion, MD;  Location: [facility];  Service: Orthopedics;  Laterality: Right;  . PERIPHERAL VASCULAR ATHERECTOMY Right 08/10/2019   Procedure: PERIPHERAL VASCULAR ATHERECTOMY;  Surgeon: Waynetta Sandy, MD;  Location: [facility];  Service: Cardiovascular;  Laterality: Right;  superficial femoral  . PERIPHERAL VASCULAR BALLOON ANGIOPLASTY Right 08/10/2019   Procedure: PERIPHERAL VASCULAR BALLOON ANGIOPLASTY;  Surgeon: Waynetta Sandy, MD;  Location: [facility];  Service: Cardiovascular;  Laterality: Right;  anterior tibial  .  TRANSMETATARSAL AMPUTATION Left 07/21/2017   Procedure: TRANSMETATARSAL AMPUTATION LEFT FOOT;  Surgeon: Aviva Signs, MD;  [facility];  Service: General;  Laterality: Left;   Social History   Occupational History  . Not on file  Tobacco Use  . Smoking status: Current Every Day Smoker    Packs/day: 0.75    Years: 25.00    Pack years: 18.75    Types: Cigarettes    Start date: 09/16/1984  . Smokeless tobacco: Never Used  Substance and Sexual Activity  . Alcohol use: No  . Drug use: No  . Sexual activity: Yes    Birth control/protection: None

## 2019-09-02 ENCOUNTER — Other Ambulatory Visit: Payer: Self-pay

## 2019-09-02 ENCOUNTER — Encounter (HOSPITAL_COMMUNITY): Payer: Self-pay | Admitting: Orthopedic Surgery

## 2019-09-02 ENCOUNTER — Ambulatory Visit (INDEPENDENT_AMBULATORY_CARE_PROVIDER_SITE_OTHER): Payer: Medicaid Other | Admitting: Physician Assistant

## 2019-09-02 ENCOUNTER — Other Ambulatory Visit (HOSPITAL_COMMUNITY): Payer: Medicaid Other

## 2019-09-02 ENCOUNTER — Encounter: Payer: Self-pay | Admitting: Physician Assistant

## 2019-09-02 ENCOUNTER — Other Ambulatory Visit: Payer: Self-pay | Admitting: Physician Assistant

## 2019-09-02 VITALS — Ht 64.0 in | Wt 212.0 lb

## 2019-09-02 DIAGNOSIS — L02619 Cutaneous abscess of unspecified foot: Secondary | ICD-10-CM

## 2019-09-02 DIAGNOSIS — L03119 Cellulitis of unspecified part of limb: Secondary | ICD-10-CM

## 2019-09-02 DIAGNOSIS — IMO0002 Reserved for concepts with insufficient information to code with codable children: Secondary | ICD-10-CM

## 2019-09-02 NOTE — Progress Notes (Signed)
Spoke with pt for pre-op call. Pt has hx of CHF and a heart murmur which she states has never given her any problems. Has seen Dr. Harl Bowie in the past. Pt is a type 2 diabetic. Last A1C was 10.7 on 05/24/19. She states she has been trying to get it back under control. States her fasting blood sugar is usually between 99-111. Instructed pt to take 1/2 of her regular dose of Levemir this evening - she'll take 17 units. Instructed her to check her blood sugar in the AM and every 2 hours until she leaves for the hospital. If blood sugar is 70 or below, treat with 1/2 cup of clear juice (apple or cranberry) and recheck blood sugar 15 minutes after drinking juice. If blood sugar continues to be 70 or below, call the Short Stay department and ask to speak to a nurse. Also instructed her not to take her Jardiance or Metformin in the AM. She voiced understanding.  Pt was supposed to get a Covid test done today but had taken the [facility] bus to her doctor's appt and could not go by [facility] to get the test done. Her daughter is bringing her to [facility] and will take her by [facility] at 10 AM for the Covid test and then come to [facility] for surgery. Pt voiced understanding.   Pt informed of the Visitation policy.

## 2019-09-02 NOTE — Progress Notes (Signed)
Office Visit Note   Patient: Natalie Contreras           Date of Birth: Oct 21, 1965           MRN: [id number] Visit Date: 09/02/2019              Requested by: Celene Squibb, MD [place],  [place] [id number] PCP: Celene Squibb, MD  Chief Complaint  Patient presents with  . Right Foot - Routine Post Op    08/11/19 I&D right foot         HPI: This is a pleasant 53 year old woman who is 3 weeks status post irrigation debridement and application of graft to her right lateral foot she has been getting dressing changes daily by her home health nurse.  She is smoking approximately a half a pack of cigarettes daily.  Assessment & Plan: Visit Diagnoses: No diagnosis found.  Plan: Patient today was seen by Dr. Sharol Given.  He has recommended a right below-knee amputation.  He discussed the risks of the procedure surgical outcomes and recovery.  We will plan for this in the near future She will require SNF after surgery as her daughter works long days and she is alone Follow-Up Instructions: No follow-ups on file.   Ortho Exam  Patient is alert, oriented, no adenopathy, well-dressed, normal affect, normal respiratory effort. Right lower extremity: The wound has dehisced with necrotic tissue and the skin graft is no longer viable mild surrounding erythema pulse is palpable.  Imaging: No results found. No images are attached to the encounter.  Labs: Lab Results  Component Value Date   HGBA1C 10.7 (H) 06/13/2019   HGBA1C 7.1 (H) 07/19/2017   HGBA1C 7.0 (H) 05/01/2017   ESRSEDRATE 122 (H) 08/08/2019   CRP 11.7 (H) 08/08/2019   REPTSTATUS 08/15/2019 FINAL 08/11/2019   GRAMSTAIN  08/11/2019    FEW WBC PRESENT, PREDOMINANTLY PMN MODERATE GRAM POSITIVE COCCI IN PAIRS RARE GRAM NEGATIVE RODS    CULT  08/11/2019    MODERATE GROUP B STREP(S.AGALACTIAE)ISOLATED TESTING AGAINST S. AGALACTIAE NOT ROUTINELY PERFORMED DUE TO PREDICTABILITY OF AMP/PEN/VAN SUSCEPTIBILITY. RARE  ENTEROBACTER AEROGENES MODERATE PREVOTELLA BIVIA BETA LACTAMASE POSITIVE Performed at [facility], [place] [address] [place], [place] [address]    [place] AEROGENES 08/11/2019     Lab Results  Component Value Date   ALBUMIN 1.7 (L) 08/11/2019   ALBUMIN 1.8 (L) 08/10/2019   ALBUMIN 2.7 (L) 07/17/2017    Lab Results  Component Value Date   MG 2.1 08/11/2019   MG 2.2 08/10/2019   MG 2.2 03/04/2017   No results found for: VD25OH  No results found for: PREALBUMIN CBC EXTENDED Latest Ref Rng & Units 08/11/2019 08/10/2019 08/09/2019  WBC 4.0 - 10.5 K/uL 10.2 9.8 8.3  RBC 3.87 - 5.11 MIL/uL 3.46(L) 3.38(L) 3.83(L)  HGB 12.0 - 15.0 g/dL 9.3(L) 9.0(L) 10.2(L)  HCT 36.0 - 46.0 % 29.8(L) 29.5(L) 33.3(L)  PLT 150 - 400 K/uL 380 411(H) 453(H)  NEUTROABS 1.7 - 7.7 K/uL 8.0(H) 6.1 -  LYMPHSABS 0.7 - 4.0 K/uL 1.3 2.6 -     Body mass index is 36.39 kg/m.  Orders:  No orders of the defined types were placed in this encounter.  No orders of the defined types were placed in this encounter.    Procedures: No procedures performed  Clinical Data: No additional findings.  ROS:  All other systems negative, except as noted in the HPI. Review of Systems  Objective: Vital Signs: Ht 5\' 4"  (1.626 m)   Wt 212 lb (96.2 kg)   LMP 12/16/2015   BMI 36.39 kg/m   Specialty Comments:  No specialty comments available.  PMFS History: Patient Active Problem List   Diagnosis Date Noted  . 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 Severe protein-calorie malnutrition ([place])   . Gangrene of right foot ([place])   . Abscess or cellulitis of foot 08/08/2019  . Obesity (BMI 30-39.9) 08/08/2019  . Wound infection 06/13/2019  . Cellulitis 07/17/2017  . Subacute osteomyelitis, right ankle and foot ([place])   . Toe amputation status, left 05/05/2017  . Gangrene of left foot ([place])   . PAD (peripheral artery disease) ([place]) 04/10/2017  . Essential hypertension 04/10/2017  . Chronic  diastolic CHF (congestive heart failure) ([place]) 04/10/2017  . AKI (acute kidney injury) ([place]) 04/10/2017  . Foreign body in left foot   . Cellulitis and abscess of foot 04/07/2017  . Carotid stenosis, asymptomatic, bilateral 01/15/2017  . Visual disturbance 01/15/2017  . Tobacco abuse 01/15/2017  . Acute respiratory failure with hypoxia ([place]) 10/30/2016  . Pulmonary hypertension ([place]) 10/29/2016  . Acute diastolic CHF (congestive heart failure) ([place]) 10/28/2016  . Benign essential HTN 10/28/2016  . Hyperlipidemia 10/28/2016  . Diabetic infection of right foot ([place]) 10/28/2016   Past Medical History:  Diagnosis Date  . Abscess of foot 08/09/2019   WITH ULCER  RIGHT FOOT  . Acute diastolic CHF (congestive heart failure) ([place]) 10/28/2016  . Carotid stenosis, asymptomatic, bilateral 01/15/2017  . Diabetes mellitus without complication ([place])   . Heart murmur   . Hypertension   . PAD (peripheral artery disease) ([place]) 04/10/2017   Left ABI 0.74. Right ABI 0.96.    Family History  Problem Relation Age of Onset  . Lung cancer Mother   . Lung cancer Father   . AAA (abdominal aortic aneurysm) Brother     Past Surgical History:  Procedure Laterality Date  . ABDOMINAL AORTOGRAM W/LOWER EXTREMITY N/A 08/10/2019   Procedure: ABDOMINAL AORTOGRAM W/LOWER EXTREMITY;  Surgeon: Waynetta Sandy, MD;  Location: [facility];  Service: Cardiovascular;  Laterality: N/A;  . AMPUTATION TOE Left 05/05/2017   Procedure: LEFT SECOND TOE AMPUTATION;  Surgeon: Aviva Signs, MD;  [facility];  Service: General;  Laterality: Left;  . CATARACT EXTRACTION W/PHACO Left 02/08/2016   Procedure: CATARACT EXTRACTION PHACO AND INTRAOCULAR LENS PLACEMENT (IOC);  Surgeon: Tonny Branch, MD;  [facility];  Service: Ophthalmology;  Laterality: Left;  CDE 11.43   . CATARACT EXTRACTION W/PHACO Right 02/26/2016   Procedure: CATARACT EXTRACTION PHACO AND INTRAOCULAR LENS PLACEMENT RIGHT; CDE:  16.90;   Surgeon: Tonny Branch, MD;  [facility];  Service: Ophthalmology;  Laterality: Right;  . CHOLECYSTECTOMY    . FOREIGN BODY REMOVAL Left 04/09/2017   Procedure: FOREIGN BODY REMOVAL ADULT FOOT;  Surgeon: Aviva Signs, MD;  [facility];  Service: General;  Laterality: Left;  . I&D EXTREMITY Right 08/11/2019   Procedure: IRRIGATION AND DEBRIDEMENT EXTREMITY;  Surgeon: Newt Minion, MD;  Location: [facility];  Service: Orthopedics;  Laterality: Right;  . PERIPHERAL VASCULAR ATHERECTOMY Right 08/10/2019   Procedure: PERIPHERAL VASCULAR ATHERECTOMY;  Surgeon: Waynetta Sandy, MD;  Location: [facility];  Service: Cardiovascular;  Laterality: Right;  superficial femoral  . PERIPHERAL VASCULAR BALLOON ANGIOPLASTY Right 08/10/2019   Procedure: PERIPHERAL VASCULAR BALLOON ANGIOPLASTY;  Surgeon: Waynetta Sandy, MD;  Location: [facility];  Service: Cardiovascular;  Laterality: Right;  anterior tibial  . TRANSMETATARSAL AMPUTATION Left 07/21/2017   Procedure: TRANSMETATARSAL AMPUTATION LEFT FOOT;  Surgeon: Aviva Signs, MD;  [facility];  Service: General;  Laterality: Left;   Social History   Occupational History  . Not on file  Tobacco Use  . Smoking status: Current Every Day Smoker    Packs/day: 0.75    Years: 25.00    Pack years: 18.75    Types: Cigarettes    Start date: 09/16/1984  . Smokeless tobacco: Never Used  Substance and Sexual Activity  . Alcohol use: No  . Drug use: No  . Sexual activity: Yes    Birth control/protection: None

## 2019-09-03 ENCOUNTER — Inpatient Hospital Stay (HOSPITAL_COMMUNITY): Payer: Medicaid Other | Admitting: Anesthesiology

## 2019-09-03 ENCOUNTER — Other Ambulatory Visit: Payer: Self-pay

## 2019-09-03 ENCOUNTER — Encounter (HOSPITAL_COMMUNITY)
Admission: RE | Disposition: A | Discharge status: Still In Hospital | Payer: Self-pay | Source: Home / Self Care | Attending: Orthopedic Surgery

## 2019-09-03 ENCOUNTER — Inpatient Hospital Stay (HOSPITAL_COMMUNITY)
Admission: RE | Admit: 2019-09-03 | Discharge: 2019-09-06 | DRG: 240 | Disposition: A | Discharge status: Home / Self Care | Payer: Medicaid Other | Attending: Orthopedic Surgery | Admitting: Orthopedic Surgery

## 2019-09-03 ENCOUNTER — Encounter (HOSPITAL_COMMUNITY): Payer: Self-pay | Admitting: Orthopedic Surgery

## 2019-09-03 ENCOUNTER — Other Ambulatory Visit (HOSPITAL_COMMUNITY)
Admission: RE | Admit: 2019-09-03 | Discharge: 2019-09-03 | Disposition: A | Discharge status: Home / Self Care | Payer: Medicaid Other | Source: Ambulatory Visit | Attending: Orthopedic Surgery | Admitting: Orthopedic Surgery

## 2019-09-03 DIAGNOSIS — K5903 Drug induced constipation: Secondary | ICD-10-CM | POA: Diagnosis not present

## 2019-09-03 DIAGNOSIS — Z89511 Acquired absence of right leg below knee: Secondary | ICD-10-CM | POA: Diagnosis not present

## 2019-09-03 DIAGNOSIS — M869 Osteomyelitis, unspecified: Secondary | ICD-10-CM | POA: Diagnosis present

## 2019-09-03 DIAGNOSIS — F17210 Nicotine dependence, cigarettes, uncomplicated: Secondary | ICD-10-CM | POA: Diagnosis present

## 2019-09-03 DIAGNOSIS — Z20828 Contact with and (suspected) exposure to other viral communicable diseases: Secondary | ICD-10-CM | POA: Diagnosis present

## 2019-09-03 DIAGNOSIS — Z9842 Cataract extraction status, left eye: Secondary | ICD-10-CM

## 2019-09-03 DIAGNOSIS — I110 Hypertensive heart disease with heart failure: Secondary | ICD-10-CM | POA: Diagnosis present

## 2019-09-03 DIAGNOSIS — N183 Chronic kidney disease, stage 3 unspecified: Secondary | ICD-10-CM | POA: Diagnosis not present

## 2019-09-03 DIAGNOSIS — I96 Gangrene, not elsewhere classified: Secondary | ICD-10-CM | POA: Diagnosis present

## 2019-09-03 DIAGNOSIS — Z961 Presence of intraocular lens: Secondary | ICD-10-CM | POA: Diagnosis present

## 2019-09-03 DIAGNOSIS — Z89422 Acquired absence of other left toe(s): Secondary | ICD-10-CM

## 2019-09-03 DIAGNOSIS — Z9841 Cataract extraction status, right eye: Secondary | ICD-10-CM

## 2019-09-03 DIAGNOSIS — M86271 Subacute osteomyelitis, right ankle and foot: Secondary | ICD-10-CM

## 2019-09-03 DIAGNOSIS — T8781 Dehiscence of amputation stump: Secondary | ICD-10-CM

## 2019-09-03 DIAGNOSIS — R7309 Other abnormal glucose: Secondary | ICD-10-CM | POA: Diagnosis not present

## 2019-09-03 DIAGNOSIS — Y835 Amputation of limb(s) as the cause of abnormal reaction of the patient, or of later complication, without mention of misadventure at the time of the procedure: Secondary | ICD-10-CM | POA: Diagnosis present

## 2019-09-03 DIAGNOSIS — I5032 Chronic diastolic (congestive) heart failure: Secondary | ICD-10-CM | POA: Diagnosis present

## 2019-09-03 DIAGNOSIS — L02611 Cutaneous abscess of right foot: Secondary | ICD-10-CM

## 2019-09-03 DIAGNOSIS — I10 Essential (primary) hypertension: Secondary | ICD-10-CM | POA: Diagnosis not present

## 2019-09-03 DIAGNOSIS — E11621 Type 2 diabetes mellitus with foot ulcer: Secondary | ICD-10-CM | POA: Diagnosis present

## 2019-09-03 DIAGNOSIS — E1169 Type 2 diabetes mellitus with other specified complication: Secondary | ICD-10-CM | POA: Diagnosis present

## 2019-09-03 DIAGNOSIS — E1152 Type 2 diabetes mellitus with diabetic peripheral angiopathy with gangrene: Principal | ICD-10-CM | POA: Diagnosis present

## 2019-09-03 DIAGNOSIS — E669 Obesity, unspecified: Secondary | ICD-10-CM | POA: Diagnosis not present

## 2019-09-03 DIAGNOSIS — D62 Acute posthemorrhagic anemia: Secondary | ICD-10-CM | POA: Diagnosis not present

## 2019-09-03 DIAGNOSIS — Z89519 Acquired absence of unspecified leg below knee: Secondary | ICD-10-CM | POA: Diagnosis not present

## 2019-09-03 DIAGNOSIS — E46 Unspecified protein-calorie malnutrition: Secondary | ICD-10-CM | POA: Diagnosis not present

## 2019-09-03 DIAGNOSIS — E1142 Type 2 diabetes mellitus with diabetic polyneuropathy: Secondary | ICD-10-CM | POA: Diagnosis not present

## 2019-09-03 DIAGNOSIS — G8918 Other acute postprocedural pain: Secondary | ICD-10-CM | POA: Diagnosis not present

## 2019-09-03 DIAGNOSIS — R0989 Other specified symptoms and signs involving the circulatory and respiratory systems: Secondary | ICD-10-CM | POA: Diagnosis not present

## 2019-09-03 DIAGNOSIS — E8809 Other disorders of plasma-protein metabolism, not elsewhere classified: Secondary | ICD-10-CM | POA: Diagnosis not present

## 2019-09-03 DIAGNOSIS — Z720 Tobacco use: Secondary | ICD-10-CM | POA: Diagnosis not present

## 2019-09-03 HISTORY — PX: AMPUTATION: SHX166

## 2019-09-03 LAB — GLUCOSE, CAPILLARY
Glucose-Capillary: 140 mg/dL — ABNORMAL HIGH (ref 70–99)
Glucose-Capillary: 118 mg/dL — ABNORMAL HIGH (ref 70–99)
Glucose-Capillary: 113 mg/dL — ABNORMAL HIGH (ref 70–99)
Glucose-Capillary: 156 mg/dL — ABNORMAL HIGH (ref 70–99)
Glucose-Capillary: 169 mg/dL — ABNORMAL HIGH (ref 70–99)

## 2019-09-03 LAB — BASIC METABOLIC PANEL
Anion gap: 14 (ref 5–15)
BUN: 30 mg/dL — ABNORMAL HIGH (ref 6–20)
CO2: 27 mmol/L (ref 22–32)
Calcium: 9.2 mg/dL (ref 8.9–10.3)
Chloride: 99 mmol/L (ref 98–111)
Creatinine, Ser: 1.29 mg/dL — ABNORMAL HIGH (ref 0.44–1.00)
GFR calc Af Amer: 55 mL/min — ABNORMAL LOW (ref >60)
GFR calc non Af Amer: 48 mL/min — ABNORMAL LOW (ref >60)
Glucose, Bld: 141 mg/dL — ABNORMAL HIGH (ref 70–99)
Potassium: 3.8 mmol/L (ref 3.5–5.1)
Sodium: 140 mmol/L (ref 135–145)

## 2019-09-03 LAB — RESPIRATORY PANEL BY RT PCR (FLU A&B, COVID)
Influenza A by PCR: NEGATIVE
Influenza B by PCR: NEGATIVE
SARS Coronavirus 2 by RT PCR: NEGATIVE

## 2019-09-03 LAB — CBC
HCT: 42.3 % (ref 36.0–46.0)
Hemoglobin: 12.7 g/dL (ref 12.0–15.0)
MCH: 26.3 pg (ref 26.0–34.0)
MCHC: 30 g/dL (ref 30.0–36.0)
MCV: 87.6 fL (ref 80.0–100.0)
Platelets: 344 10*3/uL (ref 150–400)
RBC: 4.83 MIL/uL (ref 3.87–5.11)
RDW: 17.5 % — ABNORMAL HIGH (ref 11.5–15.5)
WBC: 7.2 10*3/uL (ref 4.0–10.5)
nRBC: 0 % (ref 0.0–0.2)

## 2019-09-03 LAB — HEMOGLOBIN A1C
Hgb A1c MFr Bld: 7 % — ABNORMAL HIGH (ref 4.8–5.6)
Mean Plasma Glucose: 154.2 mg/dL

## 2019-09-03 SURGERY — AMPUTATION BELOW KNEE
Anesthesia: General | Site: Knee | Laterality: Right

## 2019-09-03 MED ORDER — BUPIVACAINE HCL (PF) 0.5 % IJ SOLN
INTRAMUSCULAR | Status: DC | PRN
  Administered 2019-09-03: 30 mL via PERINEURAL

## 2019-09-03 MED ORDER — FENTANYL CITRATE (PF) 100 MCG/2ML IJ SOLN
25.0000 ug | INTRAMUSCULAR | Status: DC | PRN
  Administered 2019-09-03 (×4): 25 ug via INTRAVENOUS

## 2019-09-03 MED ORDER — ASPIRIN EC 81 MG PO TBEC
81.0000 mg | DELAYED_RELEASE_TABLET | Freq: Every day | ORAL | Status: DC
  Administered 2019-09-04 – 2019-09-06 (×3): 81 mg via ORAL
  Filled 2019-09-03 (×3): qty 1

## 2019-09-03 MED ORDER — METHOCARBAMOL 1000 MG/10ML IJ SOLN
500.0000 mg | Freq: Four times a day (QID) | INTRAVENOUS | Status: DC | PRN
  Filled 2019-09-03: qty 5

## 2019-09-03 MED ORDER — CEFAZOLIN SODIUM-DEXTROSE 2-4 GM/100ML-% IV SOLN
2.0000 g | Freq: Four times a day (QID) | INTRAVENOUS | Status: AC
  Administered 2019-09-03 – 2019-09-04 (×3): 2 g via INTRAVENOUS
  Filled 2019-09-03 (×3): qty 100

## 2019-09-03 MED ORDER — TORSEMIDE 20 MG PO TABS
20.0000 mg | ORAL_TABLET | Freq: Every day | ORAL | Status: DC
  Administered 2019-09-03 – 2019-09-06 (×4): 20 mg via ORAL
  Filled 2019-09-03 (×4): qty 1

## 2019-09-03 MED ORDER — ONDANSETRON HCL 4 MG/2ML IJ SOLN: 4.0000 mg | Freq: Four times a day (QID) | INTRAMUSCULAR | Status: DC | PRN

## 2019-09-03 MED ORDER — OXYCODONE HCL 5 MG PO TABS: 5.0000 mg | ORAL_TABLET | Freq: Once | ORAL | Status: DC | PRN

## 2019-09-03 MED ORDER — MIDAZOLAM HCL 2 MG/2ML IJ SOLN
INTRAMUSCULAR | Status: AC
  Filled 2019-09-03: qty 2

## 2019-09-03 MED ORDER — CANAGLIFLOZIN 100 MG PO TABS
100.0000 mg | ORAL_TABLET | Freq: Every day | ORAL | Status: DC
  Administered 2019-09-04 – 2019-09-06 (×3): 100 mg via ORAL
  Filled 2019-09-03 (×4): qty 1

## 2019-09-03 MED ORDER — OXYCODONE HCL 5 MG PO TABS
5.0000 mg | ORAL_TABLET | ORAL | Status: DC | PRN
  Administered 2019-09-03 – 2019-09-05 (×5): 10 mg via ORAL
  Filled 2019-09-03 (×5): qty 2

## 2019-09-03 MED ORDER — CEFAZOLIN SODIUM-DEXTROSE 2-4 GM/100ML-% IV SOLN: 2.0000 g | INTRAVENOUS | Status: DC

## 2019-09-03 MED ORDER — FENTANYL CITRATE (PF) 100 MCG/2ML IJ SOLN
INTRAMUSCULAR | Status: AC
  Administered 2019-09-03: 11:00:00 50 ug via INTRAVENOUS
  Filled 2019-09-03: qty 2

## 2019-09-03 MED ORDER — METOCLOPRAMIDE HCL 5 MG/ML IJ SOLN: 5.0000 mg | Freq: Three times a day (TID) | INTRAMUSCULAR | Status: DC | PRN

## 2019-09-03 MED ORDER — METOCLOPRAMIDE HCL 5 MG PO TABS: 5.0000 mg | ORAL_TABLET | Freq: Three times a day (TID) | ORAL | Status: DC | PRN

## 2019-09-03 MED ORDER — METHOCARBAMOL 500 MG PO TABS
500.0000 mg | ORAL_TABLET | Freq: Four times a day (QID) | ORAL | Status: DC | PRN
  Administered 2019-09-03 – 2019-09-04 (×2): 500 mg via ORAL
  Filled 2019-09-03 (×2): qty 1

## 2019-09-03 MED ORDER — EPHEDRINE SULFATE-NACL 50-0.9 MG/10ML-% IV SOSY
PREFILLED_SYRINGE | INTRAVENOUS | Status: DC | PRN
  Administered 2019-09-03 (×2): 10 mg via INTRAVENOUS

## 2019-09-03 MED ORDER — ONDANSETRON HCL 4 MG/2ML IJ SOLN
INTRAMUSCULAR | Status: DC | PRN
  Administered 2019-09-03: 4 mg via INTRAVENOUS

## 2019-09-03 MED ORDER — LACTATED RINGERS IV SOLN: INTRAVENOUS | Status: DC

## 2019-09-03 MED ORDER — PROPOFOL 10 MG/ML IV BOLUS
INTRAVENOUS | Status: DC | PRN
  Administered 2019-09-03: 150 mg via INTRAVENOUS

## 2019-09-03 MED ORDER — SODIUM CHLORIDE 0.9 % IV SOLN
INTRAVENOUS | Status: DC | PRN
  Administered 2019-09-03: 16:00:00 250 mL via INTRAVENOUS

## 2019-09-03 MED ORDER — ONDANSETRON HCL 4 MG PO TABS: 4.0000 mg | ORAL_TABLET | Freq: Four times a day (QID) | ORAL | Status: DC | PRN

## 2019-09-03 MED ORDER — SIMVASTATIN 20 MG PO TABS
40.0000 mg | ORAL_TABLET | Freq: Every day | ORAL | Status: DC
  Administered 2019-09-03 – 2019-09-05 (×3): 40 mg via ORAL
  Filled 2019-09-03 (×3): qty 2

## 2019-09-03 MED ORDER — FENTANYL CITRATE (PF) 250 MCG/5ML IJ SOLN
INTRAMUSCULAR | Status: DC | PRN
  Administered 2019-09-03: 50 ug via INTRAVENOUS

## 2019-09-03 MED ORDER — METFORMIN HCL 500 MG PO TABS
1000.0000 mg | ORAL_TABLET | Freq: Every day | ORAL | Status: DC
  Administered 2019-09-04 – 2019-09-06 (×3): 1000 mg via ORAL
  Filled 2019-09-03 (×3): qty 2

## 2019-09-03 MED ORDER — INSULIN DETEMIR 100 UNIT/ML ~~LOC~~ SOLN
23.0000 [IU] | Freq: Every day | SUBCUTANEOUS | Status: DC
  Administered 2019-09-03 – 2019-09-05 (×3): 23 [IU] via SUBCUTANEOUS
  Filled 2019-09-03 (×5): qty 0.23

## 2019-09-03 MED ORDER — CHLORHEXIDINE GLUCONATE 4 % EX LIQD: 60.0000 mL | Freq: Once | CUTANEOUS | Status: DC

## 2019-09-03 MED ORDER — 0.9 % SODIUM CHLORIDE (POUR BTL) OPTIME
TOPICAL | Status: DC | PRN
  Administered 2019-09-03: 1000 mL

## 2019-09-03 MED ORDER — INSULIN ASPART 100 UNIT/ML ~~LOC~~ SOLN
4.0000 [IU] | Freq: Three times a day (TID) | SUBCUTANEOUS | Status: DC
  Administered 2019-09-03 – 2019-09-06 (×8): 4 [IU] via SUBCUTANEOUS

## 2019-09-03 MED ORDER — PROMETHAZINE HCL 25 MG/ML IJ SOLN: 6.2500 mg | INTRAMUSCULAR | Status: DC | PRN

## 2019-09-03 MED ORDER — CARVEDILOL 3.125 MG PO TABS
3.1250 mg | ORAL_TABLET | Freq: Two times a day (BID) | ORAL | Status: DC
  Administered 2019-09-03 – 2019-09-06 (×6): 3.125 mg via ORAL
  Filled 2019-09-03 (×6): qty 1

## 2019-09-03 MED ORDER — ACETAMINOPHEN 325 MG PO TABS
325.0000 mg | ORAL_TABLET | Freq: Four times a day (QID) | ORAL | Status: DC | PRN
  Administered 2019-09-05: 650 mg via ORAL
  Filled 2019-09-03: qty 2

## 2019-09-03 MED ORDER — FENTANYL CITRATE (PF) 100 MCG/2ML IJ SOLN: 50.0000 ug | Freq: Once | INTRAMUSCULAR | Status: AC

## 2019-09-03 MED ORDER — FENTANYL CITRATE (PF) 250 MCG/5ML IJ SOLN
INTRAMUSCULAR | Status: AC
  Filled 2019-09-03: qty 5

## 2019-09-03 MED ORDER — DOCUSATE SODIUM 100 MG PO CAPS
100.0000 mg | ORAL_CAPSULE | Freq: Two times a day (BID) | ORAL | Status: DC
  Administered 2019-09-03 – 2019-09-06 (×5): 100 mg via ORAL
  Filled 2019-09-03 (×6): qty 1

## 2019-09-03 MED ORDER — MIDAZOLAM HCL 2 MG/2ML IJ SOLN: 2.0000 mg | Freq: Once | INTRAMUSCULAR | Status: AC

## 2019-09-03 MED ORDER — LIDOCAINE 2% (20 MG/ML) 5 ML SYRINGE
INTRAMUSCULAR | Status: DC | PRN
  Administered 2019-09-03: 60 mg via INTRAVENOUS

## 2019-09-03 MED ORDER — MIDAZOLAM HCL 2 MG/2ML IJ SOLN
INTRAMUSCULAR | Status: AC
  Administered 2019-09-03: 2 mg via INTRAVENOUS
  Filled 2019-09-03: qty 2

## 2019-09-03 MED ORDER — FENTANYL CITRATE (PF) 100 MCG/2ML IJ SOLN
INTRAMUSCULAR | Status: AC
  Filled 2019-09-03: qty 2

## 2019-09-03 MED ORDER — INSULIN ASPART 100 UNIT/ML ~~LOC~~ SOLN
0.0000 [IU] | Freq: Three times a day (TID) | SUBCUTANEOUS | Status: DC
  Administered 2019-09-03 – 2019-09-04 (×2): 3 [IU] via SUBCUTANEOUS
  Administered 2019-09-04: 09:00:00 5 [IU] via SUBCUTANEOUS
  Administered 2019-09-05: 19:00:00 3 [IU] via SUBCUTANEOUS
  Administered 2019-09-05 (×2): 2 [IU] via SUBCUTANEOUS
  Administered 2019-09-06: 3 [IU] via SUBCUTANEOUS

## 2019-09-03 MED ORDER — HYDROMORPHONE HCL 1 MG/ML IJ SOLN
0.5000 mg | INTRAMUSCULAR | Status: DC | PRN
  Administered 2019-09-03: 17:00:00 0.5 mg via INTRAVENOUS
  Filled 2019-09-03: qty 1

## 2019-09-03 MED ORDER — OXYCODONE HCL 5 MG/5ML PO SOLN: 5.0000 mg | Freq: Once | ORAL | Status: DC | PRN

## 2019-09-03 MED ORDER — DEXAMETHASONE SODIUM PHOSPHATE 4 MG/ML IJ SOLN
INTRAMUSCULAR | Status: DC | PRN
  Administered 2019-09-03: 5 mg via PERINEURAL

## 2019-09-03 SURGICAL SUPPLY — 37 items
BLADE SAW RECIP 87.9 MT (BLADE) ×2 IMPLANT
BLADE SURG 21 STRL SS (BLADE) ×2 IMPLANT
BNDG COHESIVE 6X5 TAN STRL LF (GAUZE/BANDAGES/DRESSINGS) IMPLANT
CANISTER WOUND CARE 500ML ATS (WOUND CARE) ×2 IMPLANT
COVER SURGICAL LIGHT HANDLE (MISCELLANEOUS) ×2 IMPLANT
COVER WAND RF STERILE (DRAPES) IMPLANT
CUFF TOURN SGL QUICK 34 (TOURNIQUET CUFF) ×1
CUFF TRNQT CYL 34X4.125X (TOURNIQUET CUFF) ×1 IMPLANT
DRAPE INCISE IOBAN 66X45 STRL (DRAPES) ×2 IMPLANT
DRAPE U-SHAPE 47X51 STRL (DRAPES) ×2 IMPLANT
DRESSING PREVENA PLUS CUSTOM (GAUZE/BANDAGES/DRESSINGS) ×1 IMPLANT
DRSG PREVENA PLUS CUSTOM (GAUZE/BANDAGES/DRESSINGS) ×2
DURAPREP 26ML APPLICATOR (WOUND CARE) ×2 IMPLANT
ELECT REM PT RETURN 9FT ADLT (ELECTROSURGICAL) ×2
ELECTRODE REM PT RTRN 9FT ADLT (ELECTROSURGICAL) ×1 IMPLANT
GLOVE BIOGEL PI IND STRL 9 (GLOVE) ×1 IMPLANT
GLOVE BIOGEL PI INDICATOR 9 (GLOVE) ×1
GLOVE SURG ORTHO 9.0 STRL STRW (GLOVE) ×2 IMPLANT
GOWN STRL REUS W/ TWL XL LVL3 (GOWN DISPOSABLE) ×2 IMPLANT
GOWN STRL REUS W/TWL XL LVL3 (GOWN DISPOSABLE) ×2
KIT BASIN OR (CUSTOM PROCEDURE TRAY) ×2 IMPLANT
KIT TURNOVER KIT B (KITS) ×2 IMPLANT
MANIFOLD NEPTUNE II (INSTRUMENTS) ×2 IMPLANT
NS IRRIG 1000ML POUR BTL (IV SOLUTION) ×2 IMPLANT
PACK ORTHO EXTREMITY (CUSTOM PROCEDURE TRAY) ×2 IMPLANT
PAD ARMBOARD 7.5X6 YLW CONV (MISCELLANEOUS) ×2 IMPLANT
PREVENA RESTOR ARTHOFORM 46X30 (CANNISTER) ×2 IMPLANT
SPONGE LAP 18X18 RF (DISPOSABLE) ×1 IMPLANT
STAPLER VISISTAT 35W (STAPLE) ×1 IMPLANT
STOCKINETTE IMPERVIOUS LG (DRAPES) ×2 IMPLANT
SUT ETHILON 2 0 PSLX (SUTURE) IMPLANT
SUT SILK 2 0 (SUTURE) ×1
SUT SILK 2-0 18XBRD TIE 12 (SUTURE) ×1 IMPLANT
SUT VIC AB 1 CTX 27 (SUTURE) ×4 IMPLANT
TOWEL GREEN STERILE (TOWEL DISPOSABLE) ×2 IMPLANT
TUBE CONNECTING 12X1/4 (SUCTIONS) ×2 IMPLANT
YANKAUER SUCT BULB TIP NO VENT (SUCTIONS) ×2 IMPLANT

## 2019-09-03 NOTE — Anesthesia Postprocedure Evaluation (Signed)
Anesthesia Post Note  Patient: Natalie Contreras  Procedure(s) Performed: RIGHT BELOW KNEE AMPUTATION (Right Knee)     Patient location during evaluation: PACU Anesthesia Type: General Level of consciousness: awake and alert Pain management: pain level controlled Vital Signs Assessment: post-procedure vital signs reviewed and stable Respiratory status: spontaneous breathing, nonlabored ventilation and respiratory function stable Cardiovascular status: blood pressure returned to baseline and stable Postop Assessment: no apparent nausea or vomiting Anesthetic complications: no    Last Vitals:  Vitals:   09/03/19 1300 09/03/19 1309  BP:  (!) 159/54  Pulse: 75 75  Resp: 15 15  Temp:    SpO2: 96% 97%    Last Pain:  Vitals:   09/03/19 1300  PainSc: [name]

## 2019-09-03 NOTE — Op Note (Signed)
09/03/2019  12:26 PM  PATIENT:  [name]    PRE-OPERATIVE DIAGNOSIS:  GANGRENE RIGHT FOOT  POST-OPERATIVE DIAGNOSIS:  Same  PROCEDURE:  RIGHT BELOW KNEE AMPUTATION Application of Prevena wound VAC both customizable and arthroform   SURGEON:  Newt Minion, MD  PHYSICIAN ASSISTANT:None ANESTHESIA:   General  PREOPERATIVE INDICATIONS:  TYNLEY HOMMES is a  53 y.o. female with a diagnosis of GANGRENE RIGHT FOOT who failed conservative measures and elected for surgical management.    The risks benefits and alternatives were discussed with the patient preoperatively including but not limited to the risks of infection, bleeding, nerve injury, cardiopulmonary complications, the need for revision surgery, among others, and the patient was willing to proceed.  OPERATIVE IMPLANTS: Customizable and arthroform wound VAC  @ENCIMAGES @  OPERATIVE FINDINGS: Healthy muscle no signs of infection at the level of amputation.  OPERATIVE PROCEDURE: Patient was brought the operating room and underwent a general anesthetic.  After adequate levels anesthesia were obtained patient's right lower extremity was prepped using DuraPrep draped into a sterile field a timeout was called.  A transverse incision was made 11 cm distal to the tibial tubercle.  This curved proximally and a large posterior flap was created.  The sciatic nerve was pulled cut and allowed to retract the vascular bundles were suture ligated with 2-0 silk.  The tibia was transected 1 cm proximal to the surgical incision and the fibula was transected just proximal to the tibial incision the tibia was beveled anteriorly.  The tourniquet was deflated hemostasis was obtained.  The deep and superficial fascia layers were closed using #1 Vicryl the skin was closed using staples a Praveena customizable and arthroform wound VAC were applied this had a good suction fit patient was extubated taken the PACU in stable condition.   DISCHARGE  PLANNING:  Antibiotic duration: 24-hour antibiotics  Weightbearing: Nonweightbearing on the right  Pain medication: Opioid pathway  Dressing care/ Wound VAC: Continue wound VAC at discharge  Ambulatory devices: Walker  Discharge to: Anticipate discharge to inpatient versus outpatient rehabilitation.  [facility] for stump shrinker limb protector and prosthetic fitting  Follow-up: In the office 1 week post operative.

## 2019-09-03 NOTE — Anesthesia Procedure Notes (Signed)
Procedure Name: LMA Insertion Date/Time: 09/03/2019 11:50 AM Performed by: Clearnce Sorrel, CRNA Pre-anesthesia Checklist: Patient identified, Emergency Drugs available, Suction available and Patient being monitored Patient Re-evaluated:Patient Re-evaluated prior to induction Oxygen Delivery Method: Circle System Utilized Preoxygenation: Pre-oxygenation with 100% oxygen Induction Type: IV induction Ventilation: Mask ventilation without difficulty LMA: LMA inserted LMA Size: 4.0 Number of attempts: 1 Airway Equipment and Method: Bite block Placement Confirmation: positive ETCO2 Tube secured with: Tape Dental Injury: Teeth and Oropharynx as per pre-operative assessment

## 2019-09-03 NOTE — Anesthesia Preprocedure Evaluation (Addendum)
Anesthesia Evaluation  Patient identified by MRN, date of birth, ID band Patient awake    Reviewed: Allergy & Precautions, NPO status , Patient's Chart, lab work & pertinent test results, reviewed documented beta blocker date and time   History of Anesthesia Complications Negative for: history of anesthetic complications  Airway Mallampati: II  TM Distance: >3 FB Neck ROM: Full    Dental  (+) Dental Advisory Given, Missing   Pulmonary Current Smoker and Patient abstained from smoking.,    Pulmonary exam normal        Cardiovascular hypertension, Pt. on medications and Pt. on home beta blockers + Peripheral Vascular Disease and +CHF  Normal cardiovascular exam+ Valvular Problems/Murmurs    '18 Carotid US - 50-69% bilateral ICA stenosis  '18 TTE - mild LVH.  EF 55% to 60%. Grade 2 diastolic dysfunction.Mild MR. Moderately dilated LA. Mildly dilated RA. PASP was moderately increased.  PA peak pressure: 49 mm Hg.    Neuro/Psych negative neurological ROS  negative psych ROS   GI/Hepatic negative GI ROS, Neg liver ROS,   Endo/Other  diabetes, Type 2, Insulin Dependent, Oral Hypoglycemic Agents Obesity   Renal/GU Renal disease     Musculoskeletal negative musculoskeletal ROS (+)   Abdominal   Peds  Hematology negative hematology ROS (+)   Anesthesia Other Findings   Reproductive/Obstetrics                            Anesthesia Physical Anesthesia Plan  ASA: III  Anesthesia Plan: General   Post-op Pain Management:  Regional for Post-op pain   Induction: Intravenous  PONV Risk Score and Plan: 3 and Treatment may vary due to age or medical condition, Ondansetron, Dexamethasone and Midazolam  Airway Management Planned: LMA  Additional Equipment: None  Intra-op Plan:   Post-operative Plan: Extubation in OR  Informed Consent: I have reviewed the patients History and Physical, chart,  labs and discussed the procedure including the risks, benefits and alternatives for the proposed anesthesia with the patient or authorized representative who has indicated his/her understanding and acceptance.     Dental advisory given  Plan Discussed with: CRNA and Anesthesiologist  Anesthesia Plan Comments:        Anesthesia Quick Evaluation

## 2019-09-03 NOTE — H&P (Signed)
Natalie Contreras is an 53 y.o. female.   Chief Complaint: Right Foot osteomyelitis HPI: This is a pleasant 53 year old woman who is 3 weeks status post irrigation debridement and application of graft to her right lateral foot she has been getting dressing changes daily by her home health nurse.  She is smoking approximately a half a pack of cigarettes daily.   Past Medical History:  Diagnosis Date  . Abscess of foot 08/09/2019   WITH ULCER  RIGHT FOOT  . Acute diastolic CHF (congestive heart failure) ([place]) 10/28/2016  . Carotid stenosis, asymptomatic, bilateral 01/15/2017  . Diabetes mellitus without complication ([place])   . Heart murmur   . Hypertension   . PAD (peripheral artery disease) ([place]) 04/10/2017   Left ABI 0.74. Right ABI 0.96.    Past Surgical History:  Procedure Laterality Date  . ABDOMINAL AORTOGRAM W/LOWER EXTREMITY N/A 08/10/2019   Procedure: ABDOMINAL AORTOGRAM W/LOWER EXTREMITY;  Surgeon: Waynetta Sandy, MD;  Location: [facility];  Service: Cardiovascular;  Laterality: N/A;  . AMPUTATION TOE Left 05/05/2017   Procedure: LEFT SECOND TOE AMPUTATION;  Surgeon: Aviva Signs, MD;  Location: [facility];  Service: General;  Laterality: Left;  . CATARACT EXTRACTION W/PHACO Left 02/08/2016   Procedure: CATARACT EXTRACTION PHACO AND INTRAOCULAR LENS PLACEMENT (IOC);  Surgeon: Tonny Branch, MD;  Location: [facility];  Service: Ophthalmology;  Laterality: Left;  CDE 11.43   . CATARACT EXTRACTION W/PHACO Right 02/26/2016   Procedure: CATARACT EXTRACTION PHACO AND INTRAOCULAR LENS PLACEMENT RIGHT; CDE:  16.90;  Surgeon: Tonny Branch, MD;  Location: [facility];  Service: Ophthalmology;  Laterality: Right;  . CHOLECYSTECTOMY    . FOREIGN BODY REMOVAL Left 04/09/2017   Procedure: FOREIGN BODY REMOVAL ADULT FOOT;  Surgeon: Aviva Signs, MD;  Location: [facility];  Service: General;  Laterality: Left;  . I & D EXTREMITY Right 08/11/2019   Procedure: IRRIGATION AND DEBRIDEMENT EXTREMITY;   Surgeon: Newt Minion, MD;  Location: [facility];  Service: Orthopedics;  Laterality: Right;  . PERIPHERAL VASCULAR ATHERECTOMY Right 08/10/2019   Procedure: PERIPHERAL VASCULAR ATHERECTOMY;  Surgeon: Waynetta Sandy, MD;  Location: [facility];  Service: Cardiovascular;  Laterality: Right;  superficial femoral  . PERIPHERAL VASCULAR BALLOON ANGIOPLASTY Right 08/10/2019   Procedure: PERIPHERAL VASCULAR BALLOON ANGIOPLASTY;  Surgeon: Waynetta Sandy, MD;  Location: [place] [facility] [facility];  Service: Cardiovascular;  Laterality: Right;  anterior tibial  . TRANSMETATARSAL AMPUTATION Left 07/21/2017   Procedure: TRANSMETATARSAL AMPUTATION LEFT FOOT;  Surgeon: Aviva Signs, MD;  Location: [facility];  Service: General;  Laterality: Left;    Family History  Problem Relation Age of Onset  . Lung cancer Mother   . Lung cancer Father   . AAA (abdominal aortic aneurysm) Brother    Social History:  reports that she has been smoking cigarettes. She started smoking about 34 years ago. She has a 6.25 pack-year smoking history. She has never used smokeless tobacco. She reports that she does not drink alcohol or use drugs.  Allergies: No Known Allergies  No medications prior to admission.    No results found for this or any previous visit (from the past 48 hour(s)). No results found.  Review of Systems  All other systems reviewed and are negative.   Last menstrual period 12/16/2015. Physical Exam  Patient is alert, oriented, no adenopathy, well-dressed, normal affect, normal respiratory effort. Right lower extremity: The wound has dehisced with necrotic tissue and the skin graft is no longer viable mild  surrounding erythema pulse is palpable. Assessment/Plan Visit Diagnoses: Right Foot Gangrene  Plan: Patient today was seen by Dr. Sharol Given.  He has recommended a right below-knee amputation.  He discussed the risks of the procedure surgical outcomes and recovery.  We will plan for this  in the near future She will require SNF after surgery as her daughter works long days and she is alone Follow-Up Instructions: No follow-ups on file.    Bevely Palmer Tyeshia Cornforth, PA 09/03/2019, 7:28 AM

## 2019-09-03 NOTE — Plan of Care (Signed)

## 2019-09-03 NOTE — Progress Notes (Signed)
Inpatient Rehabilitation Admissions Coordinator  Inpatient rehab consult received. I will follow up with pt's progress with initial evals to assist with planning dispo.  Danne Baxter, RN, MSN Rehab Admissions Coordinator [phone] 09/03/2019 5:42 PM

## 2019-09-03 NOTE — Transfer of Care (Signed)
Immediate Anesthesia Transfer of Care Note  Patient: Natalie Contreras  Procedure(s) Performed: RIGHT BELOW KNEE AMPUTATION (Right Knee)  Patient Location: PACU  Anesthesia Type:General and Regional  Level of Consciousness: awake, alert  and oriented  Airway & Oxygen Therapy: Patient Spontanous Breathing  Post-op Assessment: Report given to RN and Post -op Vital signs reviewed and stable  Post vital signs: Reviewed and stable  Last Vitals:  Vitals Value Taken Time  BP 135/53 09/03/19 1225  Temp 36.4 C 09/03/19 1225  Pulse 77 09/03/19 1229  Resp 15 09/03/19 1229  SpO2 96 % 09/03/19 1229  Vitals shown include unvalidated device data.  Last Pain:  Vitals:   09/03/19 1225  PainSc: (P) 0-No pain         Complications: No apparent anesthesia complications

## 2019-09-03 NOTE — Plan of Care (Signed)
  Problem: Education: Goal: Knowledge of General Education information will improve Description: Including pain rating scale, medication(s)/side effects and non-pharmacologic comfort measures Outcome: Progressing   Problem: Clinical Measurements: Goal: Respiratory complications will improve Outcome: Progressing Note: On 1L O2 acutely   Problem: Nutrition: Goal: Adequate nutrition will be maintained Outcome: Progressing   Problem: Coping: Goal: Level of anxiety will decrease Outcome: Progressing   Problem: Elimination: Goal: Will not experience complications related to urinary retention Outcome: Progressing   Problem: Pain Managment: Goal: General experience of comfort will improve Outcome: Progressing Note: Treated for pain once with dilaudid & robaxin, which decreased pain to a 3   Problem: Safety: Goal: Ability to remain free from injury will improve Outcome: Progressing   Problem: Self-Care: Goal: Ability to meet self-care needs will improve Outcome: Progressing

## 2019-09-03 NOTE — Anesthesia Procedure Notes (Signed)
Anesthesia Regional Block: Popliteal block   Pre-Anesthetic Checklist: ,, timeout performed, Correct Patient, Correct Site, Correct Laterality, Correct Procedure, Correct Position, site marked, Risks and benefits discussed,  Surgical consent,  Pre-op evaluation,  At surgeon's request and post-op pain management  Laterality: Right  Prep: chloraprep       Needles:  Injection technique: Single-shot  Needle Type: Echogenic Needle     Needle Length: 10cm  Needle Gauge: 21     Additional Needles:   Narrative:  Start time: 09/03/2019 10:47 AM End time: 09/03/2019 10:51 AM Injection made incrementally with aspirations every 5 mL.  Performed by: Personally  Anesthesiologist: Audry Pili, MD  Additional Notes: No pain on injection. No increased resistance to injection. Injection made in 5cc increments. Good needle visualization. Patient tolerated the procedure well.

## 2019-09-04 NOTE — Progress Notes (Signed)
Patient ID: Natalie Contreras, female   DOB: 1965/12/27, 53 y.o.   MRN: [id number] Postoperative day 1 right transtibial amputation.  There is no drainage in the wound VAC canister 2 axes on the seal patient does not have her stump shrinker or limb protector from [place] yet.  She has full active extension.  Anticipate discharge to inpatient versus outpatient rehabilitation.

## 2019-09-04 NOTE — Evaluation (Signed)
Physical Therapy Evaluation Patient Details Name: Natalie Contreras MRN: [id number] DOB: [birth year] Today's Date: 09/04/2019   History of Present Illness  Pt is a 53 y/o female s/p R transtibial amputation. PMH including but not limited to CHF, DM, HTN, PAD and tobacco use.    Clinical Impression  Pt presented supine in bed with HOB elevated, awake and willing to participate in therapy session. Prior to admission, pt reported that she was using a w/c primarily for mobility but able to perform transfers independently. She was also independent with ADLs. Pt lives with her daughter in a single level home with a curb step up to get in. At the time of evaluation, pt able to perform bed mobility with supervision and transfers with min A for stability. Pt a bit anxious with transfers and using RW; therefore, therapist stabilizing RW and assisting with management during pivot. Pt would continue to benefit from skilled physical therapy services at this time while admitted and after d/c to address the below listed limitations in order to improve overall safety and independence with functional mobility.     Follow Up Recommendations CIR    Equipment Recommendations  None recommended by PT    Recommendations for Other Services       Precautions / Restrictions Precautions Precautions: Fall Precaution Comments: wound VAC Restrictions Weight Bearing Restrictions: Yes RLE Weight Bearing: Non weight bearing      Mobility  Bed Mobility Overal bed mobility: Needs Assistance Bed Mobility: Supine to Sit     Supine to sit: Supervision     General bed mobility comments: HOB elevated, supervision for safety  Transfers Overall transfer level: Needs assistance Equipment used: Rolling walker (2 wheeled) Transfers: Sit to/from [facility] Sit to Stand: Min assist Stand pivot transfers: Min assist       General transfer comment: cueing for safe hand placement, min A for safety  and stability with transitions; pt pivoting to chair towards her L side, unable to hop at this time  Ambulation/Gait             General Gait Details: pt unable to hop at this time  Stairs            Wheelchair Mobility    Modified Rankin (Stroke Patients Only)       Balance Overall balance assessment: Needs assistance Sitting-balance support: No upper extremity supported;Feet supported Sitting balance-Leahy Scale: Good     Standing balance support: Bilateral upper extremity supported Standing balance-Leahy Scale: Poor                               Pertinent Vitals/Pain Pain Assessment: Faces Faces Pain Scale: Hurts little more Pain Location: R residual limb Pain Descriptors / Indicators: Guarding Pain Intervention(s): Monitored during session;Repositioned    Home Living Family/patient expects to be discharged to:: Private residence Living Arrangements: Children Available Help at Discharge: Family;Available PRN/intermittently Type of Home: House Home Access: Stairs to enter   [employer] of Steps: 1 Home Layout: One level Home Equipment: Wheelchair - manual;Bedside commode      Prior Function Level of Independence: Independent with assistive device(s)         Comments: uses a w/c for mobility, can perform transfers independently     Hand Dominance        Extremity/Trunk Assessment   Upper Extremity Assessment Upper Extremity Assessment: Overall WFL for tasks assessed    Lower Extremity Assessment Lower  Extremity Assessment: RLE deficits/detail RLE Deficits / Details: wound VAC in place to distal aspect of residual limb; pt with full active knee extension        Communication   Communication: No difficulties  Cognition Arousal/Alertness: Awake/alert Behavior During Therapy: WFL for tasks assessed/performed Overall Cognitive Status: Within Functional Limits for tasks assessed                                         General Comments      Exercises Amputee Exercises Hip ABduction/ADduction: AROM;Strengthening;Right;10 reps;Seated Knee Flexion: AROM;Strengthening;Right;10 reps;Seated Knee Extension: AROM;Strengthening;Right;10 reps;Seated Straight Leg Raises: AROM;Strengthening;Right;10 reps;Seated   Assessment/Plan    PT Assessment Patient needs continued PT services  PT Problem List Decreased strength;Decreased range of motion;Decreased activity tolerance;Decreased balance;Decreased coordination;Decreased mobility;Decreased knowledge of use of DME;Decreased safety awareness;Decreased knowledge of precautions;Pain       PT Treatment Interventions DME instruction;Gait training;Stair training;Functional mobility training;Therapeutic activities;Balance training;Therapeutic exercise;Neuromuscular re-education;Patient/family education    PT Goals (Current goals can be found in the Care Plan section)  Acute Rehab PT Goals Patient Stated Goal: to eventually get a prosthesis PT Goal Formulation: With patient Time For Goal Achievement: 09/18/19 Potential to Achieve Goals: Good    Frequency Min 5X/week   Barriers to discharge        Co-evaluation               AM-PAC PT "6 Clicks" Mobility  Outcome Measure Help needed turning from your back to your side while in a flat bed without using bedrails?: None Help needed moving from lying on your back to sitting on the side of a flat bed without using bedrails?: None Help needed moving to and from a bed to a chair (including a wheelchair)?: A Little Help needed standing up from a chair using your arms (e.g., wheelchair or bedside chair)?: A Little Help needed to walk in hospital room?: Total Help needed climbing 3-5 steps with a railing? : Total 6 Click Score: 16    End of Session Equipment Utilized During Treatment: Gait belt Activity Tolerance: Patient tolerated treatment well Patient left: in chair;with call bell/phone  within reach Nurse Communication: Mobility status PT Visit Diagnosis: Other abnormalities of gait and mobility (R26.89);Pain Pain - Right/Left: Right Pain - part of body: Leg    Time: [id number] PT Time Calculation (min) (ACUTE ONLY): 19 min   Charges:   PT Evaluation $PT Eval Moderate Complexity: 1 Mod          Eduard Clos, PT, DPT  Acute Rehabilitation Services Pager [phone] Office [place] 09/04/2019, 11:13 AM

## 2019-09-04 NOTE — Progress Notes (Signed)
Occupational Therapy Evaluation Patient Details Name: Natalie Contreras MRN: [id number] DOB: May 05, 1966 Today's Date: 09/04/2019    History of Present Illness Pt is a 53 y/o female s/p R transtibial amputation. PMH including but not limited to CHF, DM, HTN, PAD and tobacco use.   Clinical Impression   Pt presents with above diagnosis. PTA pt PLOF requiring some assistance as needed in home environment from previous admission. Pt currently limited with safe ADL engagement due to impaired balance and limited function. Pt will benefit from additional acute OT to address safety with ADL and established deficits to maximize independence prior to dc to SNF level.     Follow Up Recommendations  Supervision/Assistance - 24 hour;SNF    Equipment Recommendations  3 in 1 bedside commode    Recommendations for Other Services       Precautions / Restrictions Precautions Precautions: Fall Precaution Comments: wound VAC Restrictions Weight Bearing Restrictions: Yes RLE Weight Bearing: Non weight bearing Other Position/Activity Restrictions: "strict NWB"      Mobility Bed Mobility Overal bed mobility: Needs Assistance             General bed mobility comments: pt received in chair upon arrival.  Transfers Overall transfer level: Needs assistance Equipment used: Rolling walker (2 wheeled) Transfers: Sit to/from [facility] Sit to Stand: Min assist         General transfer comment: cueing for safe hand placement, min A for safety and stability.    Balance Overall balance assessment: Needs assistance Sitting-balance support: No upper extremity supported;Feet supported Sitting balance-Leahy Scale: Good     Standing balance support: Bilateral upper extremity supported Standing balance-Leahy Scale: Poor Standing balance comment: minG-minA                           ADL either performed or assessed with clinical judgement   ADL Overall ADL's :  Needs assistance/impaired Eating/Feeding: Set up;Sitting   Grooming: Set up;Sitting   Upper Body Bathing: Set up;Sitting   Lower Body Bathing: Minimal assistance;Sitting/lateral leans;Cueing for compensatory techniques   Upper Body Dressing : Set up;Sitting   Lower Body Dressing: Minimal assistance;Sitting/lateral leans;Cueing for compensatory techniques Lower Body Dressing Details (indicate cue type and reason): Pt able to demonstrate don of L sock, additional practice required to management don/doff of pants with lateral leans in sitting. Toilet Transfer: Minimal assistance;Moderate assistance Toilet Transfer Details (indicate cue type and reason): lateral scoot simulated to recliner            General ADL Comments: pt limited with full functional transfer due to impaired balance. Pt will require additional practice for compensatory strategies for safe ADL engagement.      Vision   Vision Assessment?: No apparent visual deficits     Perception     Praxis      Pertinent Vitals/Pain Pain Assessment: No/denies pain Faces Pain Scale: Hurts little more Pain Location: R residual limb Pain Descriptors / Indicators: Guarding Pain Intervention(s): Monitored during session;Repositioned     Hand Dominance Right   Extremity/Trunk Assessment Upper Extremity Assessment Upper Extremity Assessment: Overall WFL for tasks assessed   Lower Extremity Assessment Lower Extremity Assessment: Defer to PT evaluation   Cervical / Trunk Assessment Cervical / Trunk Assessment: Normal   Communication Communication Communication: No difficulties   Cognition Arousal/Alertness: Awake/alert Behavior During Therapy: WFL for tasks assessed/performed Overall Cognitive Status: Within Functional Limits for tasks assessed  General Comments       Exercises     Shoulder Instructions      Home Living Family/patient expects to be discharged  to:: Private residence Living Arrangements: Children Available Help at Discharge: Family;Available PRN/intermittently Type of Home: House Home Access: Stairs to enter [employer] of Steps: 1   Home Layout: One level     Bathroom Shower/Tub: Teacher, early years/pre: Standard Bathroom Accessibility: Yes How Accessible: Accessible via walker Home Equipment: Wheelchair - manual;Bedside commode          Prior Functioning/Environment Level of Independence: Independent with assistive device(s)        Comments: uses a w/c for mobility, can perform transfers independently        OT Problem List: Decreased strength;Decreased activity tolerance;Impaired balance (sitting and/or standing);Decreased knowledge of use of DME or AE;Decreased knowledge of precautions      OT Treatment/Interventions: Self-care/ADL training;DME and/or AE instruction;Therapeutic activities;Balance training;Patient/family education    OT Goals(Current goals can be found in the care plan section) Acute Rehab OT Goals Patient Stated Goal: to eventually get a prosthesis OT Goal Formulation: With patient Time For Goal Achievement: 08/28/19 Potential to Achieve Goals: Good  OT Frequency: Min 2X/week   Barriers to D/C:            Co-evaluation              AM-PAC OT "6 Clicks" Daily Activity     Outcome Measure Help from another person eating meals?: None Help from another person taking care of personal grooming?: A Little Help from another person toileting, which includes using toliet, bedpan, or urinal?: A Little Help from another person bathing (including washing, rinsing, drying)?: A Little Help from another person to put on and taking off regular upper body clothing?: None Help from another person to put on and taking off regular lower body clothing?: A Little 6 Click Score: 20   End of Session Equipment Utilized During Treatment: Gait belt Nurse Communication: Mobility  status;Weight bearing status  Activity Tolerance: Patient tolerated treatment well Patient left: in chair;with call bell/phone within reach  OT Visit Diagnosis: Other abnormalities of gait and mobility (R26.89);Pain Pain - Right/Left: Right Pain - part of body: Leg                Time: 1419-1435 OT Time Calculation (min): 16 min Charges:  OT General Charges $OT Visit: 1 Visit OT Evaluation $OT Eval Low Complexity: [place], MSOT, OTR/L  Supplemental Rehabilitation Services  [phone]   Marius Ditch 09/04/2019, 3:09 PM

## 2019-09-05 NOTE — Progress Notes (Signed)
Physical Therapy Treatment Patient Details Name: Natalie Contreras MRN: [id number] DOB: 10-09-1965 Today's Date: 09/05/2019    History of Present Illness Pt is a 53 y/o female s/p R transtibial amputation. PMH including but not limited to CHF, DM, HTN, PAD and tobacco use.    PT Comments    Pt anxious about mobility today and reports she had a near fall last night when trying to transfer. Focused session on sit<>stand repetition with proper hand placement and use of RW. Pt progressed from min to min-guard throughout session. Performed seated and standing there ex. Pt much more confident by end of session. PT will continue to follow.   Follow Up Recommendations  CIR     Equipment Recommendations  None recommended by PT    Recommendations for Other Services       Precautions / Restrictions Precautions Precautions: Fall Precaution Comments: wound VAC Restrictions Weight Bearing Restrictions: Yes RLE Weight Bearing: Non weight bearing    Mobility  Bed Mobility               General bed mobility comments: received in chair  Transfers Overall transfer level: Needs assistance Equipment used: Rolling walker (2 wheeled) Transfers: Sit to/from [facility] Sit to Stand: Min assist;Min guard         General transfer comment: worked on sit<>stand 5x for strengthening but also for proper hand placement and use of RW. Pt felt much better about use of RW after practice  Ambulation/Gait                 Stairs             Wheelchair Mobility    Modified Rankin (Stroke Patients Only)       Balance Overall balance assessment: Needs assistance Sitting-balance support: No upper extremity supported;Feet supported Sitting balance-Leahy Scale: Good     Standing balance support: Bilateral upper extremity supported Standing balance-Leahy Scale: Poor Standing balance comment: minG-minA with UE support                             Cognition Arousal/Alertness: Awake/alert Behavior During Therapy: WFL for tasks assessed/performed;Anxious Overall Cognitive Status: Within Functional Limits for tasks assessed                                        Exercises General Exercises - Lower Extremity Long Arc Quad: 10 reps;Seated;Right Amputee Exercises Quad Sets: AROM;Right;10 reps;Seated Gluteal Sets: AROM;Right;10 reps;Seated Hip Extension: AROM;Right;10 reps;Standing Hip ABduction/ADduction: AROM;Strengthening;Right;10 reps;Standing;Seated Knee Flexion: AROM;Strengthening;Right;10 reps;Seated Knee Extension: AROM;Strengthening;Right;10 reps;Seated Straight Leg Raises: AROM;Strengthening;Right;10 reps;Seated    General Comments General comments (skin integrity, edema, etc.): pt having phantom pains, discussed ideas for mgmt of this      Pertinent Vitals/Pain Pain Assessment: Faces Faces Pain Scale: Hurts little more Pain Location: R residual limb Pain Descriptors / Indicators: Guarding Pain Intervention(s): Limited activity within patient's tolerance;Monitored during session;Repositioned    Home Living                      Prior Function            PT Goals (current goals can now be found in the care plan section) Acute Rehab PT Goals Patient Stated Goal: to eventually get a prosthesis PT Goal Formulation: With patient Time For Goal Achievement: 09/18/19 Potential to  Achieve Goals: Good Progress towards PT goals: Progressing toward goals    Frequency    Min 5X/week      PT Plan Current plan remains appropriate    Co-evaluation              AM-PAC PT "6 Clicks" Mobility   Outcome Measure  Help needed turning from your back to your side while in a flat bed without using bedrails?: None Help needed moving from lying on your back to sitting on the side of a flat bed without using bedrails?: None Help needed moving to and from a bed to a chair (including a  wheelchair)?: A Little Help needed standing up from a chair using your arms (e.g., wheelchair or bedside chair)?: A Little Help needed to walk in hospital room?: Total Help needed climbing 3-5 steps with a railing? : Total 6 Click Score: 16    End of Session Equipment Utilized During Treatment: Gait belt Activity Tolerance: Patient tolerated treatment well Patient left: in chair;with call bell/phone within reach Nurse Communication: Mobility status PT Visit Diagnosis: Other abnormalities of gait and mobility (R26.89);Pain Pain - Right/Left: Right Pain - part of body: Leg     Time: [id number] PT Time Calculation (min) (ACUTE ONLY): 22 min  Charges:  $Therapeutic Exercise: 8-22 mins                     [place]  Pager [phone] Office [place] 09/05/2019, 1:59 PM

## 2019-09-05 NOTE — Plan of Care (Signed)
  Problem: Clinical Measurements: Goal: Ability to maintain clinical measurements within normal limits will improve Outcome: Adequate for Discharge Goal: Will remain free from infection Outcome: Adequate for Discharge Goal: Diagnostic test results will improve Outcome: Adequate for Discharge   

## 2019-09-06 ENCOUNTER — Other Ambulatory Visit: Payer: Self-pay

## 2019-09-06 ENCOUNTER — Inpatient Hospital Stay (HOSPITAL_COMMUNITY)
Admission: RE | Admit: 2019-09-06 | Discharge: 2019-09-16 | DRG: 560 | Disposition: A | Discharge status: Home Health | Payer: Medicaid Other | Source: Intra-hospital | Attending: Physical Medicine & Rehabilitation | Admitting: Physical Medicine & Rehabilitation

## 2019-09-06 ENCOUNTER — Encounter (HOSPITAL_COMMUNITY): Payer: Self-pay | Admitting: Physical Medicine & Rehabilitation

## 2019-09-06 DIAGNOSIS — K5903 Drug induced constipation: Secondary | ICD-10-CM | POA: Diagnosis present

## 2019-09-06 DIAGNOSIS — E1142 Type 2 diabetes mellitus with diabetic polyneuropathy: Secondary | ICD-10-CM | POA: Diagnosis present

## 2019-09-06 DIAGNOSIS — D62 Acute posthemorrhagic anemia: Secondary | ICD-10-CM

## 2019-09-06 DIAGNOSIS — Z801 Family history of malignant neoplasm of trachea, bronchus and lung: Secondary | ICD-10-CM | POA: Diagnosis not present

## 2019-09-06 DIAGNOSIS — E8809 Other disorders of plasma-protein metabolism, not elsewhere classified: Secondary | ICD-10-CM

## 2019-09-06 DIAGNOSIS — I130 Hypertensive heart and chronic kidney disease with heart failure and stage 1 through stage 4 chronic kidney disease, or unspecified chronic kidney disease: Secondary | ICD-10-CM | POA: Diagnosis present

## 2019-09-06 DIAGNOSIS — N183 Chronic kidney disease, stage 3 unspecified: Secondary | ICD-10-CM

## 2019-09-06 DIAGNOSIS — I5032 Chronic diastolic (congestive) heart failure: Secondary | ICD-10-CM | POA: Diagnosis present

## 2019-09-06 DIAGNOSIS — K5900 Constipation, unspecified: Secondary | ICD-10-CM | POA: Diagnosis not present

## 2019-09-06 DIAGNOSIS — E1151 Type 2 diabetes mellitus with diabetic peripheral angiopathy without gangrene: Secondary | ICD-10-CM | POA: Diagnosis present

## 2019-09-06 DIAGNOSIS — Z794 Long term (current) use of insulin: Secondary | ICD-10-CM | POA: Diagnosis not present

## 2019-09-06 DIAGNOSIS — Z4781 Encounter for orthopedic aftercare following surgical amputation: Secondary | ICD-10-CM | POA: Diagnosis present

## 2019-09-06 DIAGNOSIS — Z89519 Acquired absence of unspecified leg below knee: Secondary | ICD-10-CM | POA: Diagnosis not present

## 2019-09-06 DIAGNOSIS — Z89511 Acquired absence of right leg below knee: Secondary | ICD-10-CM | POA: Diagnosis not present

## 2019-09-06 DIAGNOSIS — F17210 Nicotine dependence, cigarettes, uncomplicated: Secondary | ICD-10-CM | POA: Diagnosis present

## 2019-09-06 DIAGNOSIS — R7309 Other abnormal glucose: Secondary | ICD-10-CM

## 2019-09-06 DIAGNOSIS — E46 Unspecified protein-calorie malnutrition: Secondary | ICD-10-CM

## 2019-09-06 DIAGNOSIS — Z6838 Body mass index (BMI) 38.0-38.9, adult: Secondary | ICD-10-CM

## 2019-09-06 DIAGNOSIS — G8918 Other acute postprocedural pain: Secondary | ICD-10-CM

## 2019-09-06 DIAGNOSIS — R0989 Other specified symptoms and signs involving the circulatory and respiratory systems: Secondary | ICD-10-CM | POA: Diagnosis not present

## 2019-09-06 DIAGNOSIS — Z7982 Long term (current) use of aspirin: Secondary | ICD-10-CM | POA: Diagnosis not present

## 2019-09-06 DIAGNOSIS — E1122 Type 2 diabetes mellitus with diabetic chronic kidney disease: Secondary | ICD-10-CM | POA: Diagnosis present

## 2019-09-06 DIAGNOSIS — E1140 Type 2 diabetes mellitus with diabetic neuropathy, unspecified: Secondary | ICD-10-CM | POA: Diagnosis present

## 2019-09-06 DIAGNOSIS — I10 Essential (primary) hypertension: Secondary | ICD-10-CM | POA: Diagnosis present

## 2019-09-06 DIAGNOSIS — E669 Obesity, unspecified: Secondary | ICD-10-CM | POA: Diagnosis present

## 2019-09-06 LAB — GLUCOSE, CAPILLARY
Glucose-Capillary: 144 mg/dL — ABNORMAL HIGH (ref 70–99)
Glucose-Capillary: 146 mg/dL — ABNORMAL HIGH (ref 70–99)
Glucose-Capillary: 165 mg/dL — ABNORMAL HIGH (ref 70–99)
Glucose-Capillary: 152 mg/dL — ABNORMAL HIGH (ref 70–99)
Glucose-Capillary: 188 mg/dL — ABNORMAL HIGH (ref 70–99)
Glucose-Capillary: 175 mg/dL — ABNORMAL HIGH (ref 70–99)
Glucose-Capillary: 75 mg/dL (ref 70–99)
Glucose-Capillary: 157 mg/dL — ABNORMAL HIGH (ref 70–99)
Glucose-Capillary: 113 mg/dL — ABNORMAL HIGH (ref 70–99)

## 2019-09-06 MED ORDER — TORSEMIDE 20 MG PO TABS
20.0000 mg | ORAL_TABLET | Freq: Every day | ORAL | Status: DC
  Administered 2019-09-07: 20 mg via ORAL
  Filled 2019-09-06: qty 1

## 2019-09-06 MED ORDER — FLEET ENEMA 7-19 GM/118ML RE ENEM: 1.0000 | ENEMA | Freq: Once | RECTAL | Status: DC | PRN

## 2019-09-06 MED ORDER — METFORMIN HCL 500 MG PO TABS
1000.0000 mg | ORAL_TABLET | Freq: Every day | ORAL | Status: DC
  Administered 2019-09-07 – 2019-09-16 (×10): 1000 mg via ORAL
  Filled 2019-09-06 (×10): qty 2

## 2019-09-06 MED ORDER — ASPIRIN EC 81 MG PO TBEC
81.0000 mg | DELAYED_RELEASE_TABLET | Freq: Every day | ORAL | Status: DC
  Administered 2019-09-07 – 2019-09-16 (×10): 81 mg via ORAL
  Filled 2019-09-06 (×10): qty 1

## 2019-09-06 MED ORDER — ACETAMINOPHEN 325 MG PO TABS
325.0000 mg | ORAL_TABLET | ORAL | Status: DC | PRN
  Administered 2019-09-09 – 2019-09-11 (×2): 650 mg via ORAL
  Filled 2019-09-06 (×3): qty 2

## 2019-09-06 MED ORDER — ALUM & MAG HYDROXIDE-SIMETH 200-200-20 MG/5ML PO SUSP: 30.0000 mL | ORAL | Status: DC | PRN

## 2019-09-06 MED ORDER — INSULIN ASPART 100 UNIT/ML ~~LOC~~ SOLN
4.0000 [IU] | Freq: Three times a day (TID) | SUBCUTANEOUS | Status: DC
  Administered 2019-09-06 – 2019-09-16 (×28): 4 [IU] via SUBCUTANEOUS

## 2019-09-06 MED ORDER — CARVEDILOL 3.125 MG PO TABS
3.1250 mg | ORAL_TABLET | Freq: Two times a day (BID) | ORAL | Status: DC
  Administered 2019-09-06 – 2019-09-16 (×20): 3.125 mg via ORAL
  Filled 2019-09-06 (×20): qty 1

## 2019-09-06 MED ORDER — INSULIN ASPART 100 UNIT/ML ~~LOC~~ SOLN
0.0000 [IU] | Freq: Three times a day (TID) | SUBCUTANEOUS | Status: DC
  Administered 2019-09-06: 2 [IU] via SUBCUTANEOUS
  Administered 2019-09-08 – 2019-09-10 (×5): 1 [IU] via SUBCUTANEOUS
  Administered 2019-09-11: 2 [IU] via SUBCUTANEOUS
  Administered 2019-09-11: 1 [IU] via SUBCUTANEOUS
  Administered 2019-09-11: 2 [IU] via SUBCUTANEOUS
  Administered 2019-09-12 (×3): 1 [IU] via SUBCUTANEOUS
  Administered 2019-09-13: 2 [IU] via SUBCUTANEOUS
  Administered 2019-09-13 – 2019-09-16 (×7): 1 [IU] via SUBCUTANEOUS

## 2019-09-06 MED ORDER — BISACODYL 10 MG RE SUPP: 10.0000 mg | Freq: Every day | RECTAL | Status: DC | PRN

## 2019-09-06 MED ORDER — TRAMADOL HCL 50 MG PO TABS
50.0000 mg | ORAL_TABLET | Freq: Four times a day (QID) | ORAL | Status: DC | PRN
  Administered 2019-09-07 – 2019-09-15 (×8): 50 mg via ORAL
  Filled 2019-09-06 (×11): qty 1

## 2019-09-06 MED ORDER — PROCHLORPERAZINE 25 MG RE SUPP: 12.5000 mg | Freq: Four times a day (QID) | RECTAL | Status: DC | PRN

## 2019-09-06 MED ORDER — METHOCARBAMOL 500 MG PO TABS
500.0000 mg | ORAL_TABLET | Freq: Four times a day (QID) | ORAL | Status: DC | PRN
  Administered 2019-09-07 – 2019-09-15 (×10): 500 mg via ORAL
  Filled 2019-09-06 (×10): qty 1

## 2019-09-06 MED ORDER — POLYETHYLENE GLYCOL 3350 17 G PO PACK
17.0000 g | PACK | Freq: Every day | ORAL | Status: DC | PRN
  Administered 2019-09-10 – 2019-09-12 (×3): 17 g via ORAL
  Filled 2019-09-06 (×4): qty 1

## 2019-09-06 MED ORDER — DOCUSATE SODIUM 100 MG PO CAPS
100.0000 mg | ORAL_CAPSULE | Freq: Two times a day (BID) | ORAL | Status: DC
  Administered 2019-09-06 – 2019-09-14 (×15): 100 mg via ORAL
  Filled 2019-09-06 (×19): qty 1

## 2019-09-06 MED ORDER — CANAGLIFLOZIN 100 MG PO TABS
100.0000 mg | ORAL_TABLET | Freq: Every day | ORAL | Status: DC
  Administered 2019-09-07 – 2019-09-13 (×7): 100 mg via ORAL
  Filled 2019-09-06 (×7): qty 1

## 2019-09-06 MED ORDER — GUAIFENESIN-DM 100-10 MG/5ML PO SYRP: 5.0000 mL | ORAL_SOLUTION | Freq: Four times a day (QID) | ORAL | Status: DC | PRN

## 2019-09-06 MED ORDER — INSULIN ASPART 100 UNIT/ML ~~LOC~~ SOLN
0.0000 [IU] | Freq: Every day | SUBCUTANEOUS | Status: DC
  Administered 2019-09-15: 2 [IU] via SUBCUTANEOUS

## 2019-09-06 MED ORDER — LIVING WELL WITH DIABETES BOOK
Freq: Once | Status: AC
  Filled 2019-09-06: qty 1

## 2019-09-06 MED ORDER — OXYCODONE HCL 5 MG PO TABS
5.0000 mg | ORAL_TABLET | ORAL | Status: DC | PRN
  Administered 2019-09-06 – 2019-09-09 (×7): 10 mg via ORAL
  Administered 2019-09-09: 5 mg via ORAL
  Administered 2019-09-09 – 2019-09-10 (×3): 10 mg via ORAL
  Filled 2019-09-06: qty 1
  Filled 2019-09-06 (×11): qty 2

## 2019-09-06 MED ORDER — TRAZODONE HCL 50 MG PO TABS
25.0000 mg | ORAL_TABLET | Freq: Every evening | ORAL | Status: DC | PRN
  Administered 2019-09-08 – 2019-09-15 (×8): 50 mg via ORAL
  Filled 2019-09-06 (×8): qty 1

## 2019-09-06 MED ORDER — PROCHLORPERAZINE MALEATE 5 MG PO TABS
5.0000 mg | ORAL_TABLET | Freq: Four times a day (QID) | ORAL | Status: DC | PRN
  Administered 2019-09-08: 10 mg via ORAL
  Filled 2019-09-06: qty 2

## 2019-09-06 MED ORDER — SIMVASTATIN 20 MG PO TABS
40.0000 mg | ORAL_TABLET | Freq: Every day | ORAL | Status: DC
  Administered 2019-09-06 – 2019-09-15 (×10): 40 mg via ORAL
  Filled 2019-09-06 (×10): qty 2

## 2019-09-06 MED ORDER — INSULIN DETEMIR 100 UNIT/ML ~~LOC~~ SOLN
23.0000 [IU] | Freq: Every day | SUBCUTANEOUS | Status: DC
  Administered 2019-09-06 – 2019-09-15 (×10): 23 [IU] via SUBCUTANEOUS
  Filled 2019-09-06 (×11): qty 0.23

## 2019-09-06 MED ORDER — DIPHENHYDRAMINE HCL 12.5 MG/5ML PO ELIX: 12.5000 mg | ORAL_SOLUTION | Freq: Four times a day (QID) | ORAL | Status: DC | PRN

## 2019-09-06 MED ORDER — PRO-STAT SUGAR FREE PO LIQD
30.0000 mL | Freq: Two times a day (BID) | ORAL | Status: DC
  Administered 2019-09-07 – 2019-09-16 (×19): 30 mL via ORAL
  Filled 2019-09-06 (×20): qty 30

## 2019-09-06 MED ORDER — PROCHLORPERAZINE EDISYLATE 10 MG/2ML IJ SOLN: 5.0000 mg | Freq: Four times a day (QID) | INTRAMUSCULAR | Status: DC | PRN

## 2019-09-06 MED ORDER — ENOXAPARIN SODIUM 40 MG/0.4ML ~~LOC~~ SOLN
40.0000 mg | SUBCUTANEOUS | Status: DC
  Administered 2019-09-06 – 2019-09-15 (×10): 40 mg via SUBCUTANEOUS
  Filled 2019-09-06 (×10): qty 0.4

## 2019-09-06 NOTE — H&P (Signed)
Physical Medicine and Rehabilitation Admission H&P    CC: Functional deficits due to R-BKA   HPI:  Natalie Contreras is a 53 year old female with history of T2DM, PAD, ongoing tobacco use who was right foot wound with progressive gangrenous changes and osteomyelitis s/p I and D with partial excision of fifth MT, cuboid and calcaneus on 11/25 with in attempts of limb salvage. She continued to have poor wound healing with gangrenous changes and was readmitted on 09/03/19 for R-BKA by Dr. Sharol Given. Post op therapy evaluations completed and CIR recommended due to functional deficits.    ROS 10 point ROS negative except as indicated in HPI.   Past Medical History:  Diagnosis Date  . Abscess of foot 08/09/2019   WITH ULCER  RIGHT FOOT  . Acute diastolic CHF (congestive heart failure) ([place]) 10/28/2016  . Carotid stenosis, asymptomatic, bilateral 01/15/2017  . Diabetes mellitus without complication ([place])   . Heart murmur   . Hypertension   . PAD (peripheral artery disease) ([place]) 04/10/2017   Left ABI 0.74. Right ABI 0.96.    Past Surgical History:  Procedure Laterality Date  . ABDOMINAL AORTOGRAM W/LOWER EXTREMITY N/A 08/10/2019   Procedure: ABDOMINAL AORTOGRAM W/LOWER EXTREMITY;  Surgeon: Waynetta Sandy, MD;  Location: [facility];  Service: Cardiovascular;  Laterality: N/A;  . AMPUTATION Right 09/03/2019   Procedure: RIGHT BELOW KNEE AMPUTATION;  Surgeon: Newt Minion, MD;  Location: [facility];  Service: Orthopedics;  Laterality: Right;  . AMPUTATION TOE Left 05/05/2017   Procedure: LEFT SECOND TOE AMPUTATION;  Surgeon: Aviva Signs, MD;  Location: [facility];  Service: General;  Laterality: Left;  . CATARACT EXTRACTION W/PHACO Left 02/08/2016   Procedure: CATARACT EXTRACTION PHACO AND INTRAOCULAR LENS PLACEMENT (IOC);  Surgeon: Tonny Branch, MD;  Location: [facility];  Service: Ophthalmology;  Laterality: Left;  CDE 11.43   . CATARACT EXTRACTION W/PHACO Right 02/26/2016   Procedure:  CATARACT EXTRACTION PHACO AND INTRAOCULAR LENS PLACEMENT RIGHT; CDE:  16.90;  Surgeon: Tonny Branch, MD;  Location: [facility];  Service: Ophthalmology;  Laterality: Right;  . CHOLECYSTECTOMY    . FOREIGN BODY REMOVAL Left 04/09/2017   Procedure: FOREIGN BODY REMOVAL ADULT FOOT;  Surgeon: Aviva Signs, MD;  Location: [facility];  Service: General;  Laterality: Left;  . I & D EXTREMITY Right 08/11/2019   Procedure: IRRIGATION AND DEBRIDEMENT EXTREMITY;  Surgeon: Newt Minion, MD;  Location: [facility];  Service: Orthopedics;  Laterality: Right;  . PERIPHERAL VASCULAR ATHERECTOMY Right 08/10/2019   Procedure: PERIPHERAL VASCULAR ATHERECTOMY;  Surgeon: Waynetta Sandy, MD;  Location: [facility];  Service: Cardiovascular;  Laterality: Right;  superficial femoral  . PERIPHERAL VASCULAR BALLOON ANGIOPLASTY Right 08/10/2019   Procedure: PERIPHERAL VASCULAR BALLOON ANGIOPLASTY;  Surgeon: Waynetta Sandy, MD;  Location: [facility];  Service: Cardiovascular;  Laterality: Right;  anterior tibial  . TRANSMETATARSAL AMPUTATION Left 07/21/2017   Procedure: TRANSMETATARSAL AMPUTATION LEFT FOOT;  Surgeon: Aviva Signs, MD;  Location: [facility];  Service: General;  Laterality: Left;    Family History  Problem Relation Age of Onset  . Lung cancer Mother   . Lung cancer Father   . AAA (abdominal aortic aneurysm) Brother     Social History: Lives with her daughter. She  reports that she has been smoking cigarettes. She started smoking about 34 years ago. She has a 6.25 pack-year smoking history. She has never used smokeless tobacco. She reports that she does not drink alcohol  or use drugs.    Allergies: No Known Allergies    Medications Prior to Admission  Medication Sig Dispense Refill  . acetaminophen (TYLENOL) 325 MG tablet Take 2 tablets (650 mg total) by mouth every 6 (six) hours as needed for mild pain, fever or headache (or Fever >/= 101). (Patient not taking: Reported on  09/04/2019) 12 tablet 0  . aspirin EC 81 MG tablet Take 1 tablet (81 mg total) by mouth daily with breakfast. 30 tablet 2  . carvedilol (COREG) 6.25 MG tablet Take 6.25 mg by mouth 2 (two) times daily.    . empagliflozin (JARDIANCE) 25 MG TABS tablet Take 25 mg by mouth daily.    . insulin detemir (LEVEMIR) 100 UNIT/ML injection Inject 25 Units into the skin at bedtime.     . metFORMIN (GLUCOPHAGE) 1000 MG tablet Take 1,000 mg by mouth 2 (two) times daily.    . simvastatin (ZOCOR) 40 MG tablet Take 40 mg by mouth at bedtime.     . torsemide (DEMADEX) 20 MG tablet Take 1 tablet (20 mg total) by mouth daily. (Patient taking differently: Take 20 mg by mouth 2 (two) times daily. ) 30 tablet 2    Drug Regimen Review  Drug regimen was reviewed and remains appropriate with no significant issues identified  Home: Home Living Family/patient expects to be discharged to:: Private residence Living Arrangements: Children Available Help at Discharge: Family, Available PRN/intermittently Type of Home: House Home Access: Stairs to enter [employer] bre[employer] of Steps: 1 Home Layout: One level Bathroom Shower/Tub: Chiropodist: Standard Bathroom Accessibility: Yes Home Equipment: Wheelchair - manual, Bedside commode   Functional History: Prior Function Level of Independence: Independent with assistive device(s) Comments: uses a w/c for mobility, can perform transfers independently Functional Status:  Mobility: Bed Mobility Overal bed mobility: Needs Assistance Bed Mobility: Supine to Sit Supine to sit: Supervision General bed mobility comments: received in chair Transfers Overall transfer level: Needs assistance Equipment used: Rolling walker (2 wheeled) Transfers: Sit to/from Stand, [employer] Transfers Sit to Stand: Min assist, Min guard Stand pivot transfers: Min assist General transfer comment: worked on sit<>stand 5x for strengthening but also for proper hand  placement and use of RW. Pt felt much better about use of RW after practice Ambulation/Gait General Gait Details: pt unable to hop at this time  ADL: ADL Overall ADL's : Needs assistance/impaired Eating/Feeding: Set up, Sitting Grooming: Set up, Sitting Upper Body Bathing: Set up, Sitting Lower Body Bathing: Minimal assistance, Sitting/lateral leans, Cueing for compensatory techniques Upper Body Dressing : Set up, Sitting Lower Body Dressing: Minimal assistance, Sitting/lateral leans, Cueing for compensatory techniques Lower Body Dressing Details (indicate cue type and reason): Pt able to demonstrate don of L sock, additional practice required to management don/doff of pants with lateral leans in sitting. Toilet Transfer: Minimal assistance, Moderate assistance Toilet Transfer Details (indicate cue type and reason): lateral scoot simulated to recliner  General ADL Comments: pt limited with full functional transfer due to impaired balance. Pt will require additional practice for compensatory strategies for safe ADL engagement.   Cognition: Cognition Overall Cognitive Status: Within Functional Limits for tasks assessed Orientation Level: Oriented X4 Cognition Arousal/Alertness: Awake/alert Behavior During Therapy: WFL for tasks assessed/performed, Anxious Overall Cognitive Status: Within Functional Limits for tasks assessed   Blood pressure (!) 152/52, pulse 76, temperature 98.1 F (36.7 C), temperature source Oral, resp. rate 18, last menstrual period 12/16/2015, SpO2 100 %. Physical Exam  General: Alert and oriented x 3,  No apparent distress HEENT: Head is normocephalic, atraumatic, PERRLA, EOMI, sclera anicteric, oral mucosa pink and moist, missing teeth, ext ear canals clear,  Neck: Supple without JVD or lymphadenopathy Heart: Reg rate and rhythm. No murmurs rubs or gallops Chest: CTA bilaterally without wheezes, rales, or rhonchi; no distress Abdomen: Soft, non-tender,  non-distended, bowel sounds positive. Extremities: No clubbing, cyanosis, or edema. Pulses are 2+ Skin: Clean and intact without signs of breakdown Neuro: Pt is cognitively appropriate with normal insight, memory, and awareness. Cranial nerves 2-12 are intact. No tremors.   Musculoskeletal: Motor function is grossly 5/5. Able to ,move right residual limb antigravity.  Psych: Pt's affect is appropriate. Pt is cooperative   Results for orders placed or performed during the hospital encounter of 09/03/19 (from the past 48 hour(s))  Glucose, capillary     Status: Abnormal   Collection Time: 09/06/19  6:53 AM  Result Value Ref Range   Glucose-Capillary 188 (H) 70 - 99 mg/dL   Comment 1 Document in Chart   Glucose, capillary     Status: Abnormal   Collection Time: 09/06/19  8:21 AM  Result Value Ref Range   Glucose-Capillary 175 (H) 70 - 99 mg/dL  Glucose, capillary     Status: None   Collection Time: 09/06/19 11:27 AM  Result Value Ref Range   Glucose-Capillary 75 70 - 99 mg/dL   No results found.     Medical Problem List and Plan: 1.  Impaired mobility and ADLs secondary to Right BKA  -patient may shower,m but wound vac should be covered  -ELOS/Goals: modI in PT, OT, I in SLP 2.  Antithrombotics: -DVT/anticoagulation:  Pharmaceutical: Lovenox--added  -antiplatelet therapy: ASA 3. Pain Management: Oxycodone prn.  4. Mood: LCSW to follow for evaluation and support.   -antipsychotic agents: N/A 5. Neuropsych: This patient is capable of making decisions on her own behalf. 6. Skin/Wound Care: Routine pressure relief measures. Will add protein supplement to promote wound healing.  7. Fluids/Electrolytes/Nutrition: Monitor I/O. Check lytes in am. Will add nutritional supplements for wound healing.  8. HTN: Continue to monitor BP tid--on coreg bid with Demadex daily. BP elevated upon admission to 0000000 systolic. Past readings this week have dropped to 0000000 systolic so I will not add a  hypertensive agent. 9. T2DM: Monitor BS ac/hs. On Levemir at bedtime and  Metformin/canaflifozin in am.  10.  CKD: Baseline SCr around 1.3 per record. Continue to encourage fluid intake.   Reesa Chew, PA-C  I have personally performed a face to face diagnostic evaluation, including, but not limited to relevant history and physical exam findings, of this patient and developed relevant assessment and plan.  Additionally, I have reviewed and concur with the physician assistant's documentation above.  The patient's status has not changed. The original post admission physician evaluation remains appropriate, and any changes from the pre-admission screening or documentation from the acute chart are noted above.   Leeroy Cha, MD

## 2019-09-06 NOTE — Progress Notes (Signed)
POD#3 BKA. Doing well. VSS  Sitting in chair anxious to begin next step at rehab  Reviewed notes from weekend. PT Recommending CIR if possible. If not approved or no room would plan for dc to snf.

## 2019-09-06 NOTE — Progress Notes (Signed)
Natalie Contreras to be D/C'd to inpatient rehab per MD order. Pt verbalized understanding. Pt switched to the prevena wound vac, educated pt on how to work it and trouble shoot, pt is familiar with the wound vac had one before. No complaints of pain.  Allergies as of 09/06/2019   No Known Allergies     Medication List    STOP taking these medications   levofloxacin 750 MG tablet Commonly known as: Levaquin   oxyCODONE 5 MG immediate release tablet Commonly known as: Oxy IR/ROXICODONE   silver sulfADIAZINE 1 % cream Commonly known as: Silvadene     TAKE these medications   acetaminophen 325 MG tablet Commonly known as: TYLENOL Take 2 tablets (650 mg total) by mouth every 6 (six) hours as needed for mild pain, fever or headache (or Fever >/= 101).   aspirin EC 81 MG tablet Take 1 tablet (81 mg total) by mouth daily with breakfast.   carvedilol 6.25 MG tablet Commonly known as: COREG Take 6.25 mg by mouth 2 (two) times daily. What changed: Another medication with the same name was removed. Continue taking this medication, and follow the directions you see here.   insulin detemir 100 UNIT/ML injection Commonly known as: LEVEMIR Inject 25 Units into the skin at bedtime.   Jardiance 25 MG Tabs tablet Generic drug: empagliflozin Take 25 mg by mouth daily.   metFORMIN 1000 MG tablet Commonly known as: GLUCOPHAGE Take 1,000 mg by mouth 2 (two) times daily.   simvastatin 40 MG tablet Commonly known as: ZOCOR Take 40 mg by mouth at bedtime.   torsemide 20 MG tablet Commonly known as: DEMADEX Take 1 tablet (20 mg total) by mouth daily. What changed: when to take this       Vitals:   09/06/19 0442 09/06/19 0740  BP: (!) 165/65 (!) 158/57  Pulse: 75 72  Resp:  16  Temp: 98.3 F (36.8 C) 98.6 F (37 C)  SpO2: 95% 93%     Patient escorted via WC, and transferred to 4W 19, report called to deborah, Therapist, sports.   [place] 09/06/2019 1:51 PM

## 2019-09-06 NOTE — Progress Notes (Signed)
Pt was admitted and oriented to unit. Reviewed Medications, plan of care, and  therapy schedule. Pt denies pain upon admission. Amanda Cockayne, LPN

## 2019-09-06 NOTE — Progress Notes (Signed)
Izora Ribas, MD  Physician  Physical Medicine and Rehabilitation  PMR Pre-admission  Signed  Date of Service:  09/06/2019 12:14 PM      Related encounter: Admission (Discharged) from 09/03/2019 in [facility]        Show:Clear all [x]Manual[x]Template[]Copied  Added by: [x]Shelaine Frie, Vertis Kelch, RN[x]Raulkar, Clide Deutscher, MD  []Hover for details PMR Admission Coordinator Pre-Admission Assessment   Patient: Natalie Contreras is an 53 y.o., female MRN: [id number] DOB: [birth year] Height: 5' 4" (162.6 cm) Weight: 96.2 kg   Insurance Information HMO:     PPO:      PCP:      IPA:      80/20:      OTHER:  PRIMARY: Medicaid [place] [employer]      Policy#: [id number]      Subscriber: pt Benefits:  Phone #: passport one online     Name: 12/21 Eff. Date: active MADCY   Medicaid Application Date:       Case Manager:  Disability Application Date:       Case Worker:    The "Data Collection Information Summary" for patients in Inpatient Rehabilitation Facilities with attached "[place] Records" was provided and verbally reviewed with: N/A   Emergency Contact Information         Contact Information     Name Relation Home Work Mobile    Dario,[name] Daughter [phone]   [phone]    Lynden Ang [phone]   [phone]         Current Medical History  Patient Admitting Diagnosis: BKA   History of Present Illness: 53 year old female presented 09/03/2019 with chief complaint of right foot abscess. History of diastolic CHF, bilateral carotid stenosis, DM, HTN, and PAD as well as left LE trans met amputation 2018. Medical history of I and D 35/70/1779 with application of graft to her right lateral foot. 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 Postoperatively given aspirin for DVT prophylaxis. Incisional wound VAC.   Patient's [facility]  has been  reviewed by the rehabilitation admission coordinator and physician.   Past Medical History      Past Medical History:  Diagnosis Date  . Abscess of foot 08/09/2019    WITH ULCER  RIGHT FOOT  . Acute diastolic CHF (congestive heart failure) ([place]) 10/28/2016  . Carotid stenosis, asymptomatic, bilateral 01/15/2017  . Diabetes mellitus without complication ([place])    . Heart murmur    . Hypertension    . PAD (peripheral artery disease) ([place]) 04/10/2017    Left ABI 0.74. Right ABI 0.96.      Family History   family history includes AAA (abdominal aortic aneurysm) in her brother; Lung cancer in her father and mother.   Prior Rehab/Hospitalizations Has the patient had prior rehab or hospitalizations prior to admission? Yes   Has the patient had major surgery during 100 days prior to admission? Yes              Current Medications   Current Facility-Administered Medications:  .  0.9 %  sodium chloride infusion, , Intravenous, PRN, Newt Minion, MD, Stopped at 09/03/19 1807 .  acetaminophen (TYLENOL) tablet 325-650 mg, 325-650 mg, Oral, Q6H PRN, Persons, Bevely [facility], PA, 650 mg at 09/05/19 1154 .  aspirin EC tablet 81 mg, 81 mg, Oral, Q breakfast, Persons, Bevely [place], [place], 81 mg at 09/06/19  0838 .  canagliflozin (INVOKANA) tablet 100 mg, 100 mg, Oral, QAC breakfast, Persons, Mary Anne, PA, 100 mg at 09/06/19 1006 .  carvedilol (COREG) tablet 3.125 mg, 3.125 mg, Oral, BID WC, Persons, Mary Anne, PA, 3.125 mg at 09/06/19 0838 .  docusate sodium (COLACE) capsule 100 mg, 100 mg, Oral, BID, Persons, Mary Anne, PA, 100 mg at 09/06/19 1006 .  HYDROmorphone (DILAUDID) injection 0.5 mg, 0.5 mg, Intravenous, Q4H PRN, Persons, Mary Anne, PA, 0.5 mg at 09/03/19 1631 .  insulin aspart (novoLOG) injection 0-15 Units, 0-15 Units, Subcutaneous, TID WC, Persons, Mary Anne, PA, 3 Units at 09/06/19 0839 .  insulin aspart (novoLOG) injection 4 Units, 4 Units, Subcutaneous, TID WC, Persons, Mary Anne, PA, 4 Units at  09/06/19 0838 .  insulin detemir (LEVEMIR) injection 23 Units, 23 Units, Subcutaneous, QHS, Persons, Mary Anne, PA, 23 Units at 09/05/19 2150 .  metFORMIN (GLUCOPHAGE) tablet 1,000 mg, 1,000 mg, Oral, Q breakfast, Persons, Mary Anne, PA, 1,000 mg at 09/06/19 0837 .  methocarbamol (ROBAXIN) tablet 500 mg, 500 mg, Oral, Q6H PRN, 500 mg at 09/04/19 2020 **OR** methocarbamol (ROBAXIN) 500 mg in dextrose 5 % 50 mL IVPB, 500 mg, Intravenous, Q6H PRN, Persons, Mary Anne, PA .  metoCLOPramide (REGLAN) tablet 5-10 mg, 5-10 mg, Oral, Q8H PRN **OR** metoCLOPramide (REGLAN) injection 5-10 mg, 5-10 mg, Intravenous, Q8H PRN, Persons, Mary Anne, PA .  ondansetron (ZOFRAN) tablet 4 mg, 4 mg, Oral, Q6H PRN **OR** ondansetron (ZOFRAN) injection 4 mg, 4 mg, Intravenous, Q6H PRN, Persons, Mary Anne, PA .  oxyCODONE (Oxy IR/ROXICODONE) immediate release tablet 5-10 mg, 5-10 mg, Oral, Q4H PRN, Persons, Mary Anne, PA, 10 mg at 09/05/19 0725 .  simvastatin (ZOCOR) tablet 40 mg, 40 mg, Oral, q1800, Persons, Mary Anne, PA, 40 mg at 09/05/19 1833 .  torsemide (DEMADEX) tablet 20 mg, 20 mg, Oral, Daily, Persons, Mary Anne, PA, 20 mg at 09/06/19 1006   Patients Current Diet:     Diet Order                      Diet - low sodium heart healthy           Diet Carb Modified Fluid consistency: Thin; Room service appropriate? Yes  Diet effective now                   Precautions / Restrictions Precautions Precautions: Fall Precaution Comments: wound VAC Restrictions Weight Bearing Restrictions: Yes RLE Weight Bearing: Non weight bearing Other Position/Activity Restrictions: "strict NWB"    Has the patient had 2 or more falls or a fall with injury in the past year? No   Prior Activity Level Limited Community (1-2x/wk): Mod I at wheelchair level for weeks   Prior Functional Level Self Care: Did the patient need help bathing, dressing, using the toilet or eating? Independent   Indoor Mobility: Did the patient  need assistance with walking from room to room (with or without device)? Dependent   Stairs: Did the patient need assistance with internal or external stairs (with or without device)? Dependent   Functional Cognition: Did the patient need help planning regular tasks such as shopping or remembering to take medications? Independent   Home Assistive Devices / Equipment Home Assistive Devices/Equipment: Bedside commode/3-in-1, Wheelchair, CBG Meter Home Equipment: Wheelchair - manual, Bedside commode   Prior Device Use: Indicate devices/aids used by the patient prior to current illness, exacerbation or injury? Manual wheelchair   Current Functional Level Cognition     Overall Cognitive Status: Within Functional Limits for tasks assessed Orientation Level: Oriented X4    Extremity Assessment (includes Sensation/Coordination)   Upper Extremity Assessment: Overall WFL for tasks assessed  Lower Extremity Assessment: Defer to PT evaluation RLE Deficits / Details: wound VAC in place to distal aspect of residual limb; pt with full active knee extension      ADLs   Overall ADL's : Needs assistance/impaired Eating/Feeding: Set up, Sitting Grooming: Set up, Sitting Upper Body Bathing: Set up, Sitting Lower Body Bathing: Minimal assistance, Sitting/lateral leans, Cueing for compensatory techniques Upper Body Dressing : Set up, Sitting Lower Body Dressing: Minimal assistance, Sitting/lateral leans, Cueing for compensatory techniques Lower Body Dressing Details (indicate cue type and reason): Pt able to demonstrate don of L sock, additional practice required to management don/doff of pants with lateral leans in sitting. Toilet Transfer: Minimal assistance, Moderate assistance Toilet Transfer Details (indicate cue type and reason): lateral scoot simulated to recliner  General ADL Comments: pt limited with full functional transfer due to impaired balance. Pt will require additional practice for  compensatory strategies for safe ADL engagement.      Mobility   Overal bed mobility: Needs Assistance Bed Mobility: Supine to Sit Supine to sit: Supervision General bed mobility comments: received in chair     Transfers   Overall transfer level: Needs assistance Equipment used: Rolling walker (2 wheeled) Transfers: Sit to/from Stand, Stand Pivot Transfers Sit to Stand: Min assist, Min guard Stand pivot transfers: Min assist General transfer comment: worked on sit<>stand 5x for strengthening but also for proper hand placement and use of RW. Pt felt much better about use of RW after practice     Ambulation / Gait / Stairs / Wheelchair Mobility   Ambulation/Gait General Gait Details: pt unable to hop at this time     Posture / Balance Balance Overall balance assessment: Needs assistance Sitting-balance support: No upper extremity supported, Feet supported Sitting balance-Leahy Scale: Good Standing balance support: Bilateral upper extremity supported Standing balance-Leahy Scale: Poor Standing balance comment: minG-minA with UE support     Special needs/care consideration BiPAP/CPAP  CPM  Continuous Drip IV  Dialysis  Life Vest  Oxygen  Special Bed  Trach Size  Wound Vac applied to surgical incision Skin MASD to buttocks bilaterally Bowel mgmt: continent LBM 12/19 Bladder mgmt: external catheter Diabetic mgmt: yes Hgb A1c 7.0 Behavioral consideration  Chemo/radiation  Designated visitor is daughter, [name]    Previous Home Environment  Living Arrangements: (lives with daughter, [name])  Lives With: Daughter Available Help at Discharge: Family, Available PRN/intermittently Type of Home: Apartment Home Layout: One level Home Access: Stairs to enter Entrance Stairs-Number of Steps: 1 Bathroom Shower/Tub: Tub/shower unit, Curtain Bathroom Toilet: Standard Bathroom Accessibility: No(not wheelchair accessible) How Accessible: Accessible via walker Home Care Services:  No   Discharge Living Setting Plans for Discharge Living Setting: Lives with (comment), Apartment(daughter, [name]) Type of Home at Discharge: Apartment Discharge Home Layout: One level Discharge Home Access: Stairs to enter Entrance Stairs-Rails: None Entrance Stairs-Number of Steps: 1 Discharge Bathroom Shower/Tub: Tub/shower unit, Curtain Discharge Bathroom Toilet: Standard Discharge Bathroom Accessibility: No Does the patient have any problems obtaining your medications?: No   Social/Family/Support Systems Patient Roles: Parent Contact Information: [name] , daughter Anticipated Caregiver: daughter Anticipated Caregiver's Contact Information: see above Ability/Limitations of Caregiver: works Caregiver Availability: Intermittent Discharge Plan Discussed with Primary Caregiver: Yes Is Caregiver In Agreement with Plan?: Yes Does Caregiver/Family have Issues with Lodging/Transportation while Pt is in Rehab?: No     Goals/Additional Needs Patient/Family Goal for Rehab: Mod I with PT and OT Expected length of stay: ELOS 5 to 7 days Equipment Needs: wound VAC Pt/Family Agrees to Admission and willing to participate: Yes Program Orientation Provided & Reviewed with Pt/Caregiver Including Roles  & Responsibilities: Yes   Decrease burden of Care through IP rehab admission:    Possible need for SNF placement upon discharge:    Patient Condition: I have reviewed medical records from [facility] , spoken with CSW, and patient. I met with patient at the bedside for inpatient rehabilitation assessment.  Patient will benefit from ongoing PT and OT, can actively participate in 3 hours of therapy a day 5 days of the week, and can make measurable gains during the admission.  Patient will also benefit from the coordinated team approach during an Inpatient Acute Rehabilitation admission.  The patient will receive intensive therapy as well as Rehabilitation physician, nursing, social worker, and  care management interventions.  Due to bladder management, bowel management, safety, skin/wound care, disease management, medication administration, pain management and patient education the patient requires 24 hour a day rehabilitation nursing.  The patient is currently min assist with mobility and basic ADLs.  Discharge setting and therapy post discharge at home with home health is anticipated.  Patient has agreed to participate in the Acute Inpatient Rehabilitation Program and will admit today.   Preadmission Screen Completed By:  ,  Godwin, 09/06/2019 12:14 PM ______________________________________________________________________   Discussed status with Dr. Raulkar on 09/06/2019 at 1228 and received approval for admission today.   Admission Coordinator:  ,  Godwin, RN, time  1228 Date  09/06/2019    Assessment/Plan: Diagnosis: Right BKA 1. Does the need for close, 24 hr/day Medical supervision in concert with the patient's rehab needs make it unreasonable for this patient to be served in a less intensive setting? Yes 2. Co-Morbidities requiring supervision/potential complications: diastolic CHF, benign essential HTN, HLD, pulmonary HTN, carotid stenosis, AKI, obesity  3. Due to safety, skin/wound care, disease management, medication administration, pain management and patient education, does the patient require 24 hr/day rehab nursing? Yes 4. Does the patient require coordinated care of a physician, rehab nurse, PT, OT, and SLP to address physical and functional deficits in the context of the above medical diagnosis(es)? Yes Addressing deficits in the following areas: balance, endurance, locomotion, strength, transferring, bowel/bladder control, bathing, dressing, feeding, grooming, toileting and psychosocial support 5. Can the patient actively participate in an intensive therapy program of at least 3 hrs of therapy 5 days a week? Yes 6. The potential for patient to  make measurable gains while on inpatient rehab is excellent 7. Anticipated functional outcomes upon discharge from inpatient rehab: modified independent PT, modified independent OT, independent SLP 8. Estimated rehab length of stay to reach the above functional goals is: 10-14 days 9. Anticipated discharge destination: Home 10. Overall Rehab/Functional Prognosis: excellent     MD Signature: Krutika Raulkar, MD        Revision History                      

## 2019-09-06 NOTE — PMR Pre-admission (Signed)
PMR Admission Coordinator Pre-Admission Assessment  Patient: Natalie Contreras is an 53 y.o., female MRN: [id number] DOB: 25-Mar-1966 Height: '5\' 4"'$  (162.6 cm) Weight: 96.2 kg  Insurance Information HMO:     PPO:      PCP:      IPA:      80/20:      OTHER:  PRIMARY: Medicaid [place] Access      Policy#: [id number]      Subscriber: pt Benefits:  Phone #: passport one online     Name: 12/21 Eff. Date: active MADCY  Medicaid Application Date:       Case Manager:  Disability Application Date:       Case Worker:   The "Data Collection Information Summary" for patients in Inpatient Rehabilitation Facilities with attached "[place] Records" was provided and verbally reviewed with: N/A  Emergency Contact Information Contact Information    Name Relation Home Work Mobile   [place],[name] Daughter [phone]  [phone]   Lynden Ang [phone]  [phone]      Current Medical History  Patient Admitting Diagnosis: BKA  History of Present Illness: 53 year old female presented 09/03/2019 with chief complaint of right foot abscess. History of diastolic CHF, bilateral carotid stenosis, DM, HTN, and PAD as well as left LE trans met amputation 2018. Medical history of I and D 94/85/4627 with application of graft to her right lateral foot. 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 Postoperatively given aspirin for DVT prophylaxis. Incisional wound VAC.    Patient's medical record from [facility]  has been reviewed by the rehabilitation admission coordinator and physician.  Past Medical History  Past Medical History:  Diagnosis Date  . Abscess of foot 08/09/2019   WITH ULCER  RIGHT FOOT  . Acute diastolic CHF (congestive heart failure) ([place]) 10/28/2016  . Carotid stenosis, asymptomatic, bilateral 01/15/2017  . Diabetes mellitus without complication ([place])   . Heart murmur   . Hypertension   . PAD (peripheral artery disease) ([place])  04/10/2017   Left ABI 0.74. Right ABI 0.96.    Family History   family history includes AAA (abdominal aortic aneurysm) in her brother; Lung cancer in her father and mother.  Prior Rehab/Hospitalizations Has the patient had prior rehab or hospitalizations prior to admission? Yes  Has the patient had major surgery during 100 days prior to admission? Yes   Current Medications  Current Facility-Administered Medications:  .  0.9 %  sodium chloride infusion, , Intravenous, PRN, Newt Minion, MD, Stopped at 09/03/19 1807 .  acetaminophen (TYLENOL) tablet 325-650 mg, 325-650 mg, Oral, Q6H PRN, Persons, Bevely Palmer, PA, 650 mg at 09/05/19 1154 .  aspirin EC tablet 81 mg, 81 mg, Oral, Q breakfast, Persons, Bevely Palmer, [place], 81 mg at 09/06/19 0350 .  canagliflozin [facility]) tablet 100 mg, 100 mg, Oral, QAC breakfast, Persons, Bevely Palmer, PA, 100 mg at 09/06/19 1006 .  carvedilol (COREG) tablet 3.125 mg, 3.125 mg, Oral, BID WC, Persons, Bevely Palmer, PA, 3.125 mg at 09/06/19 0938 .  docusate sodium (COLACE) capsule 100 mg, 100 mg, Oral, BID, Persons, Bevely Palmer, PA, 100 mg at 09/06/19 1006 .  HYDROmorphone (DILAUDID) injection 0.5 mg, 0.5 mg, Intravenous, Q4H PRN, Persons, Bevely Palmer, PA, 0.5 mg at 09/03/19 1631 .  insulin aspart (novoLOG) injection 0-15 Units, 0-15 Units, Subcutaneous, TID WC, Persons, Bevely Palmer, [place], 3 Units at 09/06/19 [phone] .  insulin aspart (novoLOG) injection 4 Units, 4 Units, Subcutaneous, TID WC,  Persons, Bevely Palmer, [place], 4 Units at 09/06/19 [phone] .  insulin detemir (LEVEMIR) injection 23 Units, 23 Units, Subcutaneous, QHS, Persons, Bevely Palmer, [place], 23 Units at 09/05/19 2150 .  metFORMIN (GLUCOPHAGE) tablet 1,000 mg, 1,000 mg, Oral, Q breakfast, Persons, Bevely Palmer, [place], 1,000 mg at 09/06/19 0837 .  methocarbamol (ROBAXIN) tablet 500 mg, 500 mg, Oral, Q6H PRN, 500 mg at 09/04/19 2020 **OR** methocarbamol (ROBAXIN) 500 mg in dextrose 5 % 50 mL IVPB, 500 mg, Intravenous, Q6H PRN, Persons, Bevely Palmer, PA .  metoCLOPramide (REGLAN) tablet 5-10 mg, 5-10 mg, Oral, Q8H PRN **OR** metoCLOPramide (REGLAN) injection 5-10 mg, 5-10 mg, Intravenous, Q8H PRN, Persons, Bevely Palmer, PA .  ondansetron (ZOFRAN) tablet 4 mg, 4 mg, Oral, Q6H PRN **OR** ondansetron (ZOFRAN) injection 4 mg, 4 mg, Intravenous, Q6H PRN, Persons, Bevely Palmer, PA .  oxyCODONE (Oxy IR/ROXICODONE) immediate release tablet 5-10 mg, 5-10 mg, Oral, Q4H PRN, Persons, Bevely Palmer, PA, 10 mg at 09/05/19 0725 .  simvastatin (ZOCOR) tablet 40 mg, 40 mg, Oral, q1800, Persons, Bevely Palmer, [place], 40 mg at 09/05/19 1833 .  torsemide (DEMADEX) tablet 20 mg, 20 mg, Oral, Daily, Persons, Bevely Palmer, [place], 20 mg at 09/06/19 1006  Patients Current Diet:  Diet Order            Diet - low sodium heart healthy        Diet Carb Modified Fluid consistency: Thin; Room service appropriate? Yes  Diet effective now              Precautions / Restrictions Precautions Precautions: Fall Precaution Comments: wound VAC Restrictions Weight Bearing Restrictions: Yes RLE Weight Bearing: Non weight bearing Other Position/Activity Restrictions: "strict NWB"   Has the patient had 2 or more falls or a fall with injury in the past year? No  Prior Activity Level Limited Community (1-2x/wk): Mod I at wheelchair level for weeks  Prior Functional Level Self Care: Did the patient need help bathing, dressing, using the toilet or eating? Independent  Indoor Mobility: Did the patient need assistance with walking from room to room (with or without device)? Dependent  Stairs: Did the patient need assistance with internal or external stairs (with or without device)? Dependent  Functional Cognition: Did the patient need help planning regular tasks such as shopping or remembering to take medications? Independent  Home Assistive Devices / Equipment Home Assistive Devices/Equipment: Bedside commode/3-in-1, Wheelchair, CBG Meter Home Equipment: Wheelchair - manual,  Bedside commode  Prior Device Use: Indicate devices/aids used by the patient prior to current illness, exacerbation or injury? Manual wheelchair  Current Functional Level Cognition  Overall Cognitive Status: Within Functional Limits for tasks assessed Orientation Level: Oriented X4    Extremity Assessment (includes Sensation/Coordination)  Upper Extremity Assessment: Overall WFL for tasks assessed  Lower Extremity Assessment: Defer to PT evaluation RLE Deficits / Details: wound VAC in place to distal aspect of residual limb; pt with full active knee extension     ADLs  Overall ADL's : Needs assistance/impaired Eating/Feeding: Set up, Sitting Grooming: Set up, Sitting Upper Body Bathing: Set up, Sitting Lower Body Bathing: Minimal assistance, Sitting/lateral leans, Cueing for compensatory techniques Upper Body Dressing : Set up, Sitting Lower Body Dressing: Minimal assistance, Sitting/lateral leans, Cueing for compensatory techniques Lower Body Dressing Details (indicate cue type and reason): Pt able to demonstrate don of L sock, additional practice required to management don/doff of pants with lateral leans in sitting. Toilet Transfer: Minimal assistance, Moderate assistance Toilet Transfer Details (indicate cue  type and reason): lateral scoot simulated to recliner  General ADL Comments: pt limited with full functional transfer due to impaired balance. Pt will require additional practice for compensatory strategies for safe ADL engagement.     Mobility  Overal bed mobility: Needs Assistance Bed Mobility: Supine to Sit Supine to sit: Supervision General bed mobility comments: received in chair    Transfers  Overall transfer level: Needs assistance Equipment used: Rolling walker (2 wheeled) Transfers: Sit to/from Stand, Stand Pivot Transfers Sit to Stand: Min assist, Min guard Stand pivot transfers: Min assist General transfer comment: worked on sit<>stand 5x for strengthening  but also for proper hand placement and use of RW. Pt felt much better about use of RW after practice    Ambulation / Gait / Stairs / Wheelchair Mobility  Ambulation/Gait General Gait Details: pt unable to hop at this time    Posture / Balance Balance Overall balance assessment: Needs assistance Sitting-balance support: No upper extremity supported, Feet supported Sitting balance-Leahy Scale: Good Standing balance support: Bilateral upper extremity supported Standing balance-Leahy Scale: Poor Standing balance comment: minG-minA with UE support    Special needs/care consideration BiPAP/CPAP  CPM  Continuous Drip IV  Dialysis  Life Vest  Oxygen  Special Bed  Trach Size  Wound Vac applied to surgical incision Skin MASD to buttocks bilaterally Bowel mgmt: continent LBM 12/19 Bladder mgmt: external catheter Diabetic mgmt: yes Hgb A1c 7.0 Behavioral consideration  Chemo/radiation  Designated visitor is daughter, [name]   Previous Geologist, engineering: (lives with daughter, Julianne Rice)  Lives With: Daughter Available Help at Discharge: Family, Available PRN/intermittently Type of Home: Apartment Home Layout: One level Home Access: Stairs to enter Technical brewer of Steps: 1 Bathroom Shower/Tub: Public librarian, Architectural technologist: Programmer, systems: No(not wheelchair accessible) How Accessible: Accessible via walker Home Care Services: No  Discharge Living Setting Plans for Discharge Living Setting: Lives with (comment), Apartment(daughter, [name]) Type of Home at Discharge: Apartment Discharge Home Layout: One level Discharge Home Access: Stairs to enter Entrance Stairs-Rails: None Entrance Stairs-Number of Steps: 1 Discharge Bathroom Shower/Tub: Tub/shower unit, Curtain Discharge Bathroom Toilet: Standard Discharge Bathroom Accessibility: No Does the patient have any problems obtaining your medications?: No  Social/Family/Support  Systems Patient Roles: Manufacturing systems engineer Information: [name] , daughter Anticipated Caregiver: daughter Anticipated Ambulance person Information: see above Ability/Limitations of Caregiver: works Building control surveyor Availability: Intermittent Discharge Plan Discussed with Primary Caregiver: Yes Is Caregiver In Agreement with Plan?: Yes Does Caregiver/Family have Issues with Lodging/Transportation while Pt is in Rehab?: No  Goals/Additional Needs Patient/Family Goal for Rehab: Mod I with PT and OT Expected length of stay: ELOS 5 to 7 days Equipment Needs: wound VAC Pt/Family Agrees to Admission and willing to participate: Yes Program Orientation Provided & Reviewed with Pt/Caregiver Including Roles  & Responsibilities: Yes  Decrease burden of Care through IP rehab admission:   Possible need for SNF placement upon discharge:   Patient Condition: I have reviewed medical records from [facility] , spoken with CSW, and patient. I met with patient at the bedside for inpatient rehabilitation assessment.  Patient will benefit from ongoing PT and OT, can actively participate in 3 hours of therapy a day 5 days of the week, and can make measurable gains during the admission.  Patient will also benefit from the coordinated team approach during an Inpatient Acute Rehabilitation admission.  The patient will receive intensive therapy as well as Rehabilitation physician, nursing, social worker, and care management interventions.  Due to  bladder management, bowel management, safety, skin/wound care, disease management, medication administration, pain management and patient education the patient requires 24 hour a day rehabilitation nursing.  The patient is currently min assist with mobility and basic ADLs.  Discharge setting and therapy post discharge at home with home health is anticipated.  Patient has agreed to participate in the Acute Inpatient Rehabilitation Program and will admit today.  Preadmission  Screen Completed By:  Cleatrice Burke, 09/06/2019 12:14 PM ______________________________________________________________________   Discussed status with Dr. Ranell Patrick on 09/06/2019 at 1228 and received approval for admission today.  Admission Coordinator:  Cleatrice Burke, RN, time  1228 Date  09/06/2019   Assessment/Plan: Diagnosis: Right BKA 1. Does the need for close, 24 hr/day Medical supervision in concert with the patient's rehab needs make it unreasonable for this patient to be served in a less intensive setting? Yes 2. Co-Morbidities requiring supervision/potential complications: diastolic CHF, benign essential HTN, HLD, pulmonary HTN, carotid stenosis, AKI, obesity  3. Due to safety, skin/wound care, disease management, medication administration, pain management and patient education, does the patient require 24 hr/day rehab nursing? Yes 4. Does the patient require coordinated care of a physician, rehab nurse, PT, OT, and SLP to address physical and functional deficits in the context of the above medical diagnosis(es)? Yes Addressing deficits in the following areas: balance, endurance, locomotion, strength, transferring, bowel/bladder control, bathing, dressing, feeding, grooming, toileting and psychosocial support 5. Can the patient actively participate in an intensive therapy program of at least 3 hrs of therapy 5 days a week? Yes 6. The potential for patient to make measurable gains while on inpatient rehab is excellent 7. Anticipated functional outcomes upon discharge from inpatient rehab: modified independent PT, modified independent OT, independent SLP 8. Estimated rehab length of stay to reach the above functional goals is: 10-14 days 9. Anticipated discharge destination: Home 10. Overall Rehab/Functional Prognosis: excellent   MD Signature: Leeroy Cha, MD

## 2019-09-06 NOTE — Progress Notes (Signed)
Inpatient Rehabilitation Admissions Coordinator  Inpatient rehab consult received. I met with patient at bedside and discussed goals and expectations of an inpt rehab admit. She is in agreement to CIR. I contacted Bevely Palmer PA and will make the arrangements to admit today. SW, [place] made aware.  Danne Baxter, RN, MSN Rehab Admissions Coordinator [phone] 09/06/2019 10:22 AM

## 2019-09-06 NOTE — Discharge Summary (Signed)
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 female who was admitted 09/03/2019 with a chief complaint of Right Foot Abscess, with a final diagnosis of GANGRENE RIGHT FOOT.  Patient was brought to the operating room on 09/03/2019 and underwent Procedure(s): RIGHT BELOW KNEE AMPUTATION.    Patient was given perioperative antibiotics:  Anti-infectives (From admission, onward)   Start     Dose/Rate Route Frequency Ordered Stop   09/03/19 1600  ceFAZolin (ANCEF) IVPB 2g/100 mL premix     2 g 200 mL/hr over 30 Minutes Intravenous Every 6 hours 09/03/19 1552 09/04/19 0442   09/03/19 1045  ceFAZolin (ANCEF) IVPB 2g/100 mL premix  Status:  Discontinued     2 g 200 mL/hr over 30 Minutes Intravenous On call to O.R. 09/03/19 [id number] 09/03/19 1551    .  Patient was given sequential compression devices, early ambulation, and aspirin for DVT prophylaxis.  Recent vital signs:  Patient Vitals for the past 24 hrs:  BP Temp Temp src Pulse Resp SpO2  09/06/19 0740 (!) 158/57 98.6 F (37 C) Oral 72 16 93 %  09/06/19 0442 (!) 165/65 98.3 F (36.8 C) Oral 75 -- 95 %  09/05/19 2041 (!) 131/43 98.7 F (37.1 C) Oral 70 -- 96 %  09/05/19 1407 (!) 114/39 97.9 F (36.6 C) Oral 65 16 98 %  .  Recent laboratory studies: No results found.  Discharge Medications:   Allergies as of 09/06/2019   No Known Allergies     Medication List    STOP taking these medications   levofloxacin 750 MG tablet Commonly known as: Levaquin   oxyCODONE 5 MG immediate release tablet Commonly known as: Oxy IR/ROXICODONE   silver sulfADIAZINE 1 % cream Commonly known as: Silvadene     TAKE these medications   acetaminophen 325 MG tablet Commonly  known as: TYLENOL Take 2 tablets (650 mg total) by mouth every 6 (six) hours as needed for mild pain, fever or headache (or Fever >/= 101).   aspirin EC 81 MG tablet Take 1 tablet (81 mg total) by mouth daily with breakfast.   carvedilol 6.25 MG tablet Commonly known as: COREG Take 6.25 mg by mouth 2 (two) times daily. What changed: Another medication with the same name was removed. Continue taking this medication, and follow the directions you see here.   insulin detemir 100 UNIT/ML injection Commonly known as: LEVEMIR Inject 25 Units into the skin at bedtime.   Jardiance 25 MG Tabs tablet Generic drug: empagliflozin Take 25 mg by mouth daily.   metFORMIN 1000 MG tablet Commonly known as: GLUCOPHAGE Take 1,000 mg by mouth 2 (two) times daily.   simvastatin 40 MG tablet Commonly known as: ZOCOR Take 40 mg by mouth at bedtime.   torsemide 20 MG tablet Commonly known as: DEMADEX Take 1 tablet (20 mg total) by mouth daily. What changed: when to take this       Diagnostic Studies: MR FOOT RIGHT W WO CONTRAST  Result Date: 08/09/2019 CLINICAL DATA:  Diabetes and foot swelling, possible osteomyelitis. Neuropathy. EXAM: MRI OF THE RIGHT FOREFOOT WITHOUT AND WITH CONTRAST TECHNIQUE: Multiplanar, multisequence MR imaging of the right forefoot was performed before and after the administration of intravenous contrast.  CONTRAST:  88mL GADAVIST GADOBUTROL 1 MMOL/ML IV SOLN COMPARISON:  None. FINDINGS: Bones/Joint/Cartilage Degenerative arthropathy at the calcaneocuboid joint with subcortical marrow edema spurring as well as a small calcaneocuboid joint effusion. Ligaments The Lisfranc ligament appears intact. Muscles and Tendons There is atrophy and edema the abductor digiti minimi muscle and flexor digiti minimi brevis muscle likely related to cutaneous ulceration along the plantar foot and overlying lateral ulceration adjacent to base of the fifth metatarsal. No appreciable drainable  abscess. Low-level edema tracks along the remaining plantar musculature of the foot. Soft tissues Cutaneous ulceration along the plantar foot below the base of the fifth metatarsal, and also lateral to the base of the fifth metatarsal. IMPRESSION: 1. Cutaneous ulceration along the plantar foot and lateral to the base of the fifth metatarsal. No drainable abscess or osteomyelitis. 2. Atrophy and edema in the abductor digiti minimi and flexor digiti minimi brevis muscle, likely from myositis. 3. 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 Electronically Signed   By: Van Clines M.D.   On: 08/09/2019 09:56   PERIPHERAL VASCULAR CATHETERIZATION  Result Date: 08/10/2019 Patient name: Natalie Contreras MRN: [id number] DOB: January 10, 1966 Sex: female 08/10/2019 Pre-operative Diagnosis: Critical right lower extremity ischemia Post-operative diagnosis:  Same Surgeon:  Erlene Quan C. Donzetta Matters, MD Procedure Performed: 1.  Ultrasound-guided cannulation left common femoral artery 2.  CO2 aortogram and bilateral lower extremity angiography 3.  Jetstream atherectomy right SFA and popliteal arteries with distal filter protection 4.  Drug-coated balloon angioplasty of popliteal artery with 4 x 150 and in.pact and SFA with 5 x 250 in.pact 5.  Mynx device closure left common femoral artery 6.  Moderate sedation with fentanyl and Versed for 109 minutes Indications: 53 year old female with several month history of plantar ulcer on the right.  She now has lateral foot ulceration.  She is indicated for aortogram possible intervention of the right lower extremity. Findings: CO2 aortogram demonstrated no aortic or iliac lesions.  Right side was site of interest.  There appeared to be multiple level stenosis of the SFA and popliteal.  She has two-vessel runoff via the anterior tibial and peroneal arteries.  After intervention where there were multiple greater than 70% stenosis there is now no residual stenosis no  dissection in the SFA or popliteal.  Runoff is via the anterior tibial and peroneal arteries to the foot.  The anterior tibial artery initially appeared to have approximately 70% lesion but after intervening higher up appeared to only be 50% this was not intervened upon.  On the left side there were multiple stenoses in the SFA that do not appear to be flow-limiting.  She appears to have three-vessel runoff to the foot.  Procedure:  The patient was identified in the holding area and taken to room 8.  The patient was then placed supine on the table and prepped and draped in the usual sterile fashion.  A time out was called.  Ultrasound was used to evaluate the left common femoral artery.  There was some disease there.  There is anesthetized 1% lidocaine cannulated with direct ultrasound visualization a micropuncture needle followed by wire and sheath.  Images saved the permanent record.  Bentson wires placed over 5 French sheath.  Moderate sedation with fentanyl and Versed was administered and her vital signs were monitored throughout the course.  On the catheter placed to level 1 and CO2 aortogram performed.  We pulled this the bifurcation perform bilateral lower extremity angiography.  We then crossed the bifurcation and  use contrast to evaluate the SFA and popliteal arteries.  With this we placed a long 7 French sheath into the right common femoral artery from the left side patient was fully heparinized.  Glidewire quick cross catheter were used to cross the multiple stenoses down into the below-knee popliteal artery.  We confirmed intraluminal access.  We exchanged for a bare wire and deployed a filter.  We then proceeded with jetstream atherectomy of the entire SFA and popliteal segment.  We then ballooned this entire segment with 4 mm plain balloon.  There was no residual dissection.  We then used a 4 x 150 drug-coated balloon distally and a 5 x 250 drug-coated balloon proximally.  Completion demonstrated no  residual stenosis no dissection.  We removed our filter was intact.  We attempted to cannulate the anterior tibial artery but could not.  The anterior tibial artery previously appear to have 70% stenosis now only 50% as flow improved.  With this we changed for short 7 French sheath in the left common femoral artery.  Minx device was deployed.  She tolerated procedure well immediate complication. Contrast: 70cc Brandon C. Donzetta Matters, MD [facility] Office: [phone] Pager: [phone]  DG Foot Complete Right  Result Date: 08/08/2019 CLINICAL DATA:  Wounds on right foot for the past month. EXAM: RIGHT FOOT COMPLETE - 3+ VIEW COMPARISON:  07/21/2019 FINDINGS: Diffuse soft tissue swelling. Soft tissue defect about the plantar surface of the midfoot on the lateral view. Soft tissue defect about the lateral mid foot on the AP and oblique views may represent the same area as on the lateral. Small Achilles and calcaneal spurs.  Vascular calcifications. No osseous destruction.  Midfoot degenerative changes. IMPRESSION: Soft tissue swelling with at least 1 soft tissue ulcer about the plantar midfoot as detailed above. No plain film evidence of osteomyelitis. Electronically Signed   By: Abigail Miyamoto M.D.   On: 08/08/2019 09:22   VAS [place] ABI WITH/WO TBI  Result Date: 08/09/2019 LOWER EXTREMITY DOPPLER STUDY Indications: Rest pain, and peripheral artery disease. High Risk Factors: Hypertension, Diabetes.  Comparison Study: no prior Performing Technologist: Abram Sander RVS  Examination Guidelines: A complete evaluation includes at minimum, Doppler waveform signals and systolic blood pressure reading at the level of bilateral brachial, anterior tibial, and posterior tibial arteries, when vessel segments are accessible. Bilateral testing is considered an integral part of a complete examination. Photoelectric Plethysmograph (PPG) waveforms and toe systolic pressure readings are included as  required and additional duplex testing as needed. Limited examinations for reoccurring indications may be performed as noted.  ABI Findings: +--------+------------------+-----+----------+-----------+ Right   Rt Pressure (mmHg)IndexWaveform  Comment     +--------+------------------+-----+----------+-----------+ [id number]                    triphasic             +--------+------------------+-----+----------+-----------+ PTA                                      not audible +--------+------------------+-----+----------+-----------+ DP      58                0.34 monophasic            +--------+------------------+-----+----------+-----------+ +--------+------------------+-----+---------+-------+ Left    Lt Pressure (mmHg)IndexWaveform Comment +--------+------------------+-----+---------+-------+ [id number]  triphasic        +--------+------------------+-----+---------+-------+ PTA     105               0.62 biphasic         +--------+------------------+-----+---------+-------+ DP      107               0.63 biphasic         +--------+------------------+-----+---------+-------+ +-------+-----------+-----------+------------+------------+ ABI/TBIToday's ABIToday's TBIPrevious ABIPrevious TBI +-------+-----------+-----------+------------+------------+ Right  0.34                                           +-------+-----------+-----------+------------+------------+ Left   0.63                                           +-------+-----------+-----------+------------+------------+  Summary: Right: Resting right ankle-brachial index indicates severe right lower extremity arterial disease. Left: Resting left ankle-brachial index indicates moderate left lower extremity arterial disease.  *See table(s) above for measurements and observations.  Electronically signed by Curt Jews MD on 08/09/2019 at 1:34:25 PM.   Final     Patient benefited  maximally from their hospital stay and there were no complications.     Disposition: Discharge disposition: [facility]      Discharge Instructions    Call MD / Call 911   Complete by: As directed    If you experience chest pain or shortness of breath, CALL 911 and be transported to the hospital emergency room.  If you develope a fever above 101 F, pus (white drainage) or increased drainage or redness at the wound, or calf pain, call your surgeon's office.   Constipation Prevention   Complete by: As directed    Drink plenty of fluids.  Prune juice may be helpful.  You may use a stool softener, such as Colace (over the counter) 100 mg twice a day.  Use MiraLax (over the counter) for constipation as needed.   Diet - low sodium heart healthy   Complete by: As directed    Increase activity slowly as tolerated   Complete by: As directed    Neg Press Wound Therapy / Incisional   Complete by: As directed    Show patient how to attach prevena pump   Neg Press Wound Therapy / Incisional   Complete by: As directed    Show patient how to attach prevena vac     Follow-up Information    Newt Minion, MD In 1 week.   Specialty: Orthopedic Surgery Contact information: [address] [place] [address] [phone]            Signed: Bevely Palmer Leita Lindbloom 09/06/2019, 10:21 AM

## 2019-09-07 ENCOUNTER — Inpatient Hospital Stay (HOSPITAL_COMMUNITY): Payer: Medicaid Other

## 2019-09-07 ENCOUNTER — Inpatient Hospital Stay (HOSPITAL_COMMUNITY): Payer: Medicaid Other | Admitting: Occupational Therapy

## 2019-09-07 DIAGNOSIS — E1142 Type 2 diabetes mellitus with diabetic polyneuropathy: Secondary | ICD-10-CM

## 2019-09-07 DIAGNOSIS — I10 Essential (primary) hypertension: Secondary | ICD-10-CM

## 2019-09-07 DIAGNOSIS — N183 Chronic kidney disease, stage 3 unspecified: Secondary | ICD-10-CM

## 2019-09-07 DIAGNOSIS — E46 Unspecified protein-calorie malnutrition: Secondary | ICD-10-CM

## 2019-09-07 DIAGNOSIS — E8809 Other disorders of plasma-protein metabolism, not elsewhere classified: Secondary | ICD-10-CM

## 2019-09-07 DIAGNOSIS — D62 Acute posthemorrhagic anemia: Secondary | ICD-10-CM

## 2019-09-07 LAB — CBC WITH DIFFERENTIAL/PLATELET
Abs Immature Granulocytes: 0.02 10*3/uL (ref 0.00–0.07)
Basophils Absolute: 0.1 10*3/uL (ref 0.0–0.1)
Basophils Relative: 1 %
Eosinophils Absolute: 0.4 10*3/uL (ref 0.0–0.5)
Eosinophils Relative: 7 %
HCT: 33.6 % — ABNORMAL LOW (ref 36.0–46.0)
Hemoglobin: 10.4 g/dL — ABNORMAL LOW (ref 12.0–15.0)
Immature Granulocytes: 0 %
Lymphocytes Relative: 35 %
Lymphs Abs: 2.3 10*3/uL (ref 0.7–4.0)
MCH: 27.1 pg (ref 26.0–34.0)
MCHC: 31 g/dL (ref 30.0–36.0)
MCV: 87.5 fL (ref 80.0–100.0)
Monocytes Absolute: 0.5 10*3/uL (ref 0.1–1.0)
Monocytes Relative: 7 %
Neutro Abs: 3.2 10*3/uL (ref 1.7–7.7)
Neutrophils Relative %: 50 %
Platelets: 251 10*3/uL (ref 150–400)
RBC: 3.84 MIL/uL — ABNORMAL LOW (ref 3.87–5.11)
RDW: 17.2 % — ABNORMAL HIGH (ref 11.5–15.5)
WBC: 6.5 10*3/uL (ref 4.0–10.5)
nRBC: 0 % (ref 0.0–0.2)

## 2019-09-07 LAB — COMPREHENSIVE METABOLIC PANEL
ALT: 6 U/L (ref 0–44)
AST: 8 U/L — ABNORMAL LOW (ref 15–41)
Albumin: 2.6 g/dL — ABNORMAL LOW (ref 3.5–5.0)
Alkaline Phosphatase: 85 U/L (ref 38–126)
Anion gap: 12 (ref 5–15)
BUN: 47 mg/dL — ABNORMAL HIGH (ref 6–20)
CO2: 28 mmol/L (ref 22–32)
Calcium: 8.8 mg/dL — ABNORMAL LOW (ref 8.9–10.3)
Chloride: 99 mmol/L (ref 98–111)
Creatinine, Ser: 1.21 mg/dL — ABNORMAL HIGH (ref 0.44–1.00)
GFR calc Af Amer: 60 mL/min — ABNORMAL LOW (ref >60)
GFR calc non Af Amer: 51 mL/min — ABNORMAL LOW (ref >60)
Glucose, Bld: 138 mg/dL — ABNORMAL HIGH (ref 70–99)
Potassium: 3.7 mmol/L (ref 3.5–5.1)
Sodium: 139 mmol/L (ref 135–145)
Total Bilirubin: 0.3 mg/dL (ref 0.3–1.2)
Total Protein: 5.7 g/dL — ABNORMAL LOW (ref 6.5–8.1)

## 2019-09-07 LAB — GLUCOSE, CAPILLARY
Glucose-Capillary: 232 mg/dL — ABNORMAL HIGH (ref 70–99)
Glucose-Capillary: 176 mg/dL — ABNORMAL HIGH (ref 70–99)
Glucose-Capillary: 117 mg/dL — ABNORMAL HIGH (ref 70–99)
Glucose-Capillary: 153 mg/dL — ABNORMAL HIGH (ref 70–99)
Glucose-Capillary: 118 mg/dL — ABNORMAL HIGH (ref 70–99)
Glucose-Capillary: 107 mg/dL — ABNORMAL HIGH (ref 70–99)
Glucose-Capillary: 96 mg/dL (ref 70–99)
Glucose-Capillary: 137 mg/dL — ABNORMAL HIGH (ref 70–99)

## 2019-09-07 MED ORDER — TORSEMIDE 20 MG PO TABS
20.0000 mg | ORAL_TABLET | Freq: Every day | ORAL | Status: DC
  Administered 2019-09-10 – 2019-09-16 (×7): 20 mg via ORAL
  Filled 2019-09-07 (×7): qty 1

## 2019-09-07 MED ORDER — ADULT MULTIVITAMIN W/MINERALS CH
1.0000 | ORAL_TABLET | Freq: Every day | ORAL | Status: DC
  Administered 2019-09-08 – 2019-09-16 (×9): 1 via ORAL
  Filled 2019-09-07 (×10): qty 1

## 2019-09-07 NOTE — Progress Notes (Signed)
Inpatient Rehabilitation  Patient information reviewed and entered into eRehab system by Ciarah Peace M. Joycelin Radloff, M.A., CCC/SLP, PPS Coordinator.  Information including medical coding, functional ability and quality indicators will be reviewed and updated through discharge.    

## 2019-09-07 NOTE — Plan of Care (Signed)
  Problem: Consults Goal: RH LIMB LOSS PATIENT EDUCATION Description: Description: See Patient Education module for eduction specifics. Outcome: Progressing   Problem: RH SKIN INTEGRITY Goal: RH STG SKIN FREE OF INFECTION/BREAKDOWN Description: With min assist Outcome: Progressing Goal: RH STG ABLE TO PERFORM INCISION/WOUND CARE W/ASSISTANCE Description: STG Able To Perform Incision/Wound Care With  min Assistance. Outcome: Progressing   Problem: RH SAFETY Goal: RH STG ADHERE TO SAFETY PRECAUTIONS W/ASSISTANCE/DEVICE Description: STG Adhere to Safety Precautions With cues/reminders Assistance/Device. Outcome: Progressing   Problem: RH PAIN MANAGEMENT Goal: RH STG PAIN MANAGED AT OR BELOW PT'S PAIN GOAL Description: At or below level 4 Outcome: Progressing   Problem: RH KNOWLEDGE DEFICIT LIMB LOSS Goal: RH STG INCREASE KNOWLEDGE OF SELF CARE AFTER LIMB LOSS Description: Patient will be able to manage self care at home independently using handouts/educational resources and guides for management of diabetes including meal coverage , CBG monitoring and medications with wound care of BKA site Outcome: Progressing

## 2019-09-07 NOTE — Evaluation (Signed)
Physical Therapy Assessment and Plan  Patient Details  Name: KATIYA FIKE MRN: [id number] Date of Birth: 1965/11/15  PT Diagnosis: Abnormal posture, Abnormality of gait, Difficulty walking, Impaired sensation and Muscle weakness Rehab Potential: Good ELOS: 7-10 days   Today's Date: 09/07/2019 PT Individual Time: 6834-1962 PT Individual Time Calculation (min): 63 min    Problem List:  Patient Active Problem List   Diagnosis Date Noted  . S/P BKA (below knee amputation) ([place]) 09/06/2019  . Cutaneous abscess of right foot   . Dehiscence of amputation stump ([place])   . 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 Severe protein-calorie malnutrition ([place])   . Gangrene of right foot ([place])   . Abscess or cellulitis of foot 08/08/2019  . Obesity (BMI 30-39.9) 08/08/2019  . Wound infection 06/13/2019  . Cellulitis 07/17/2017  . Subacute osteomyelitis, right ankle and foot ([place])   . Toe amputation status, left 05/05/2017  . Gangrene of left foot ([place])   . PAD (peripheral artery disease) ([place]) 04/10/2017  . Essential hypertension 04/10/2017  . Chronic diastolic CHF (congestive heart failure) ([place]) 04/10/2017  . AKI (acute kidney injury) ([place]) 04/10/2017  . Foreign body in left foot   . Cellulitis and abscess of foot 04/07/2017  . Carotid stenosis, asymptomatic, bilateral 01/15/2017  . Visual disturbance 01/15/2017  . Tobacco abuse 01/15/2017  . Acute respiratory failure with hypoxia ([place]) 10/30/2016  . Pulmonary hypertension ([place]) 10/29/2016  . Acute diastolic CHF (congestive heart failure) ([place]) 10/28/2016  . Benign essential HTN 10/28/2016  . Hyperlipidemia 10/28/2016  . Diabetic infection of right foot ([place]) 10/28/2016    Past Medical History:  Past Medical History:  Diagnosis Date  . Abscess of foot 08/09/2019   WITH ULCER  RIGHT FOOT  . Acute diastolic CHF (congestive heart failure) ([place]) 10/28/2016  . Carotid stenosis, asymptomatic, bilateral 01/15/2017  . Diabetes mellitus  without complication ([place])   . Heart murmur   . Hypertension   . PAD (peripheral artery disease) ([place]) 04/10/2017   Left ABI 0.74. Right ABI 0.96.   Past Surgical History:  Past Surgical History:  Procedure Laterality Date  . ABDOMINAL AORTOGRAM W/LOWER EXTREMITY N/A 08/10/2019   Procedure: ABDOMINAL AORTOGRAM W/LOWER EXTREMITY;  Surgeon: Waynetta Sandy, MD;  Location: [facility];  Service: Cardiovascular;  Laterality: N/A;  . AMPUTATION Right 09/03/2019   Procedure: RIGHT BELOW KNEE AMPUTATION;  Surgeon: Newt Minion, MD;  Location: [facility];  Service: Orthopedics;  Laterality: Right;  . AMPUTATION TOE Left 05/05/2017   Procedure: LEFT SECOND TOE AMPUTATION;  Surgeon: Aviva Signs, MD;  Location: [facility];  Service: General;  Laterality: Left;  . CATARACT EXTRACTION W/PHACO Left 02/08/2016   Procedure: CATARACT EXTRACTION PHACO AND INTRAOCULAR LENS PLACEMENT (IOC);  Surgeon: Tonny Branch, MD;  Location: [facility];  Service: Ophthalmology;  Laterality: Left;  CDE 11.43   . CATARACT EXTRACTION W/PHACO Right 02/26/2016   Procedure: CATARACT EXTRACTION PHACO AND INTRAOCULAR LENS PLACEMENT RIGHT; CDE:  16.90;  Surgeon: Tonny Branch, MD;  Location: [facility];  Service: Ophthalmology;  Laterality: Right;  . CHOLECYSTECTOMY    . FOREIGN BODY REMOVAL Left 04/09/2017   Procedure: FOREIGN BODY REMOVAL ADULT FOOT;  Surgeon: Aviva Signs, MD;  Location: [facility];  Service: General;  Laterality: Left;  . I & D EXTREMITY Right 08/11/2019   Procedure: IRRIGATION AND DEBRIDEMENT EXTREMITY;  Surgeon: Newt Minion, MD;  Location: [facility];  Service: Orthopedics;  Laterality: Right;  . PERIPHERAL VASCULAR ATHERECTOMY Right 08/10/2019  Procedure: PERIPHERAL VASCULAR ATHERECTOMY;  Surgeon: Waynetta Sandy, MD;  Location: [facility];  Service: Cardiovascular;  Laterality: Right;  superficial femoral  . PERIPHERAL VASCULAR BALLOON ANGIOPLASTY Right 08/10/2019   Procedure: PERIPHERAL VASCULAR  BALLOON ANGIOPLASTY;  Surgeon: Waynetta Sandy, MD;  Location: [facility];  Service: Cardiovascular;  Laterality: Right;  anterior tibial  . TRANSMETATARSAL AMPUTATION Left 07/21/2017   Procedure: TRANSMETATARSAL AMPUTATION LEFT FOOT;  Surgeon: Aviva Signs, MD;  Location: [facility];  Service: General;  Laterality: Left;    Assessment & Plan Clinical Impression: Patient is a 53 y.o. year old female with chief complaint of right foot abscess. History of diastolic CHF, bilateral carotid stenosis, DM, HTN, and PAD as well as left LE trans met amputation 2018. Medical history of I and D 18/98/4210 with application of graft to her right lateral foot. 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 Postoperatively given aspirin for DVT prophylaxis. Incisional wound VAC.  Patient currently requires mod with mobility secondary to muscle weakness and decreased standing balance, decreased postural control, decreased balance strategies and difficulty maintaining precautions. Prior to hospitalization, patient was modified independent  with mobility and lived with Daughter in a [place] home. Home access is threshold .  Patient will benefit from skilled PT intervention to maximize safe functional mobility, minimize fall risk and decrease caregiver burden for planned discharge home with 24 hour supervision.  Anticipate patient will benefit from follow up [facility] at discharge.  PT - End of Session Activity Tolerance: Tolerates 30+ min activity with multiple rests Endurance Deficit: Yes Endurance Deficit Description: required frequent rest breaks and reported fatgiue with activity PT Assessment Rehab Potential (ACUTE/IP ONLY): Good PT Barriers to Discharge: Medical stability;Wound Care;Weight bearing restrictions PT Patient demonstrates impairments in the following area(s): Balance;Endurance;Motor;Sensory PT Transfers Functional Problem(s): Bed Mobility;Bed to Chair;Car;Furniture PT  Locomotion Functional Problem(s): Ambulation;Wheelchair Mobility;Stairs PT Plan PT Intensity: Minimum of 1-2 x/day ,45 to 90 minutes PT Frequency: 5 out of 7 days PT Duration Estimated Length of Stay: 7-10 days PT Treatment/Interventions: Ambulation/gait training;Discharge planning;Functional mobility training;Psychosocial support;Therapeutic Activities;Balance/vestibular training;Disease management/prevention;Neuromuscular re-education;Therapeutic Exercise;Wheelchair propulsion/positioning;DME/adaptive equipment instruction;Pain management;UE/LE Strength taining/ROM;Community reintegration;Patient/family education;Stair training;UE/LE Coordination activities PT Transfers Anticipated Outcome(s): Mod I with LRAD PT Locomotion Anticipated Outcome(s): Supervision with LRAD PT Recommendation Follow Up Recommendations: Home health PT Patient destination: Home Equipment Recommended: To be determined Equipment Details: Pt has 2 manual WC and slideboard  Skilled Therapeutic Intervention Evaluation completed (see details above and below) with education on PT POC and goals and individual treatment initiated with focus on functional mobility/transfers, ambulation, LE strength, balance/coordination, amputee education, and improved endurance with activity. Received pt sitting in recliner, pt agreeable to therapy, and denied any pain during session. Pt educated on PT evaluation and therapy schedule and verbalized understanding. Pt stated she sleeps in a reclining couch at home and finds the hospital beds uncomfortable and prefers to sleep in the recliner. Pt transferred stand<>pivot from recliner<>WC with RW and mod A for balance. Pt required verbal cues and increased time for pivoting on LLE. Pt performed WC mobility 56f with bilateral UE's and supervision but requested to stop due to increased fatigue in UE's. PT provided pt with RLE elevating leg rest for improved comfort. Pt performed car transfer stand<>pivot  without RW mod A. Pt with increased difficulty transferring car<>WC due to inability to push from seat of car and performed lateral scoot back to WC. Pt transferred sit<>stand x 1 with RW mod A. Pt  reported increased fear of falling and stated "the walker just feels unstable". PT provided pt with standard walker and pt transferred sit<>stand x 1 with mod A. Pt stated that the standard walker didn't feel any more stable than the RW. Pt refused ambulation during session, but did state to get into her bathroom she would need to "hop" a few steps. Therapist educated pt on "hopping technique" and expressed importance of practicing in therapy. Pt stated she would try another time. Concluded session with pt sitting in WC, needs within reach, and seatbelt alarm on.   PT Evaluation Precautions/Restrictions Precautions Precautions: Fall Precaution Comments: R BKA, wound vac Required Braces or Orthoses: Other Brace Other Brace: R BKA limb guard Restrictions Weight Bearing Restrictions: Yes RLE Weight Bearing: Non weight bearing Other Position/Activity Restrictions: "strict NWB" Home Living/Prior Functioning Home Living Living Arrangements: Children Available Help at Discharge: Family;Friend(s);Available PRN/intermittently;Available 24 hours/day Type of Home: Apartment Home Access: Other (comment)(threshold) Entrance Stairs-Number of Steps: threshold Home Layout: One level Bathroom Accessibility: No  Lives With: Daughter Prior Function Level of Independence: Requires assistive device for independence;Needs assistance with ADLs;Needs assistance with homemaking  Able to Take Stairs?: No Driving: Yes (since Nov) Comments: Lives with daughter who works and is available intermittently, however family friend staying with her 24/7 and sleeps on the couch and is able to provide 24/7 supervision. Cognition Overall Cognitive Status: Within Functional Limits for tasks assessed Arousal/Alertness:  Awake/alert Orientation Level: Oriented X4 Memory: Appears intact Awareness: Appears intact Problem Solving: Appears intact Safety/Judgment: Appears intact Comments: fearful of falling Sensation Sensation Light Touch: Impaired by gross assessment Proprioception: Appears Intact Additional Comments: Decreased RLE>LLE Coordination Gross Motor Movements are Fluid and Coordinated: No Fine Motor Movements are Fluid and Coordinated: Yes Coordination and Movement Description: grossly uncoordinated due to RBKA and LE weakness Finger Nose Finger Test: WNL Heel Shin Test: not tested on RLE due to BKA, decreased on LLE and relied on hands to move LE Motor  Motor Motor: Abnormal postural alignment and control Motor - Skilled Clinical Observations: grossly uncoordinated due to R BKA and LE weakness  Mobility Transfers Transfers: Sit to Stand;Stand to Sit;Stand Pivot Transfers;Squat Pivot Transfers Sit to Stand: Moderate Assistance - Patient 50-74% Stand to Sit: Minimal Assistance - Patient > 75% Stand Pivot Transfers: Moderate Assistance - Patient 50 - 74% Stand Pivot Transfer Details: Verbal cues for sequencing;Verbal cues for technique;Verbal cues for precautions/safety;Verbal cues for safe use of DME/AE Stand Pivot Transfer Details (indicate cue type and reason): cues for pivoting on LLE when turning and RW safety Squat Pivot Transfers: Minimal Assistance - Patient > 75% Transfer (Assistive device): Animal nutritionist Mobility: Yes Wheelchair Assistance: Chartered loss adjuster: Both upper extremities Wheelchair Parts Management: Needs assistance Distance: 44f  Trunk/Postural Assessment  Cervical Assessment Cervical Assessment: Within Functional Limits Thoracic Assessment Thoracic Assessment: Within Functional Limits Lumbar Assessment Lumbar Assessment: Within Functional Limits Postural Control Postural Control:  Deficits on evaluation  Balance Balance Balance Assessed: Yes Static Sitting Balance Static Sitting - Balance Support: No upper extremity supported Static Sitting - Level of Assistance: 5: Stand by assistance Dynamic Sitting Balance Dynamic Sitting - Balance Support: No upper extremity supported Dynamic Sitting - Level of Assistance: 5: Stand by assistance Static Standing Balance Static Standing - Balance Support: Bilateral upper extremity supported(RW) Static Standing - Level of Assistance: 4: Min assist Extremity Assessment  RLE Assessment RLE Assessment: Exceptions to [facility] Strength Comments: grossly generalized to 4/5 (hip flexion, add, abd)  LLE Assessment LLE Assessment: Exceptions to [facility] General Strength Comments: Grossly generaized to 4-/5 (except hip abd/add 4/5)   Refer to Care Plan for Long Term Goals  Recommendations for other services: None   Discharge Criteria: Patient will be discharged from PT if patient refuses treatment 3 consecutive times without medical reason, if treatment goals not met, if there is a change in medical status, if patient makes no progress towards goals or if patient is discharged from hospital.  The above assessment, treatment plan, treatment alternatives and goals were discussed and mutually agreed upon: by patient  Alfonse Alpers PT, DPT  09/07/2019, 7:28 AM

## 2019-09-07 NOTE — Progress Notes (Signed)
Physical Therapy Session Note  Patient Details  Name: Natalie Contreras MRN: [id number] Date of Birth: 1966-04-08  Today's Date: 09/07/2019 PT Individual Time: 1400-1503 PT Individual Time Calculation (min): 63 min   Short Term Goals: Week 1:  PT Short Term Goal 1 (Week 1): Pt will perform car transfer with LRAD CGA PT Short Term Goal 2 (Week 1): Pt will initiate gait training with LRAD PT Short Term Goal 3 (Week 1): Pt will perform stand<>pivot transfer with LRAD CGA  Skilled Therapeutic Interventions/Progress Updates:  Ambulation/gait training;Discharge planning;Functional mobility training;Psychosocial support;Therapeutic Activities;Balance/vestibular training;Disease management/prevention;Neuromuscular re-education;Therapeutic Exercise;Wheelchair propulsion/positioning;DME/adaptive equipment instruction;Pain management;UE/LE Strength taining/ROM;Community reintegration;Patient/family education;Stair training;UE/LE Coordination activities   Today's Interventions: Received pt sitting in WC, pt agreeable to therapy, and denied any pain throughout session. Session focused on functional mobility/trasnsfers, LE strength, standing balance, and improved endurance with activity. Pt transferred stand<>pivot WC<>mat without RW min A. Pt transferred sit<>supine with supervision. Pt performed supine SLR x15 bilaterally, hip abduction x15 bilaterally, and bridges with RLE supported on pillow x15. Pt perfromed bilateral sidelying hip abduction 2x12 with verbal and tactile cues for technique. Pt performed bilateral sidelying hip extension 2x12. Pt performed prone hip flexor stretch with pillow under R residual limb x 5 minutes while therapist educated patient on importance of spending time each day in prone along with additional amputee information. Pt performed prone on elbows to prone on extended elbows 2x5 reps with verbal cues for technique. Pt performed prone hip extension RLE 2x8 and demonstrated  increased shakiness and fatigue. Pt transferred supine<>sitting on mat with supervision and increased time. Pt performed standing horseshoes toss x3 trials with RW min A for balance. Pt performed stand<>pivot WC<>toilet min A. Pt able to perform hygiene and clothing management with min A for standing balance. Pt doffed scrub pants and shirt with min A and donned nightgown with min A. Concluded session with pt supine in bed, needs within reach, and bed alarm on. Therapist provided snacks/drink for patient.   Therapy Documentation Precautions:  Precautions Precautions: Fall Precaution Comments: R BKA, wound vac Required Braces or Orthoses: Other Brace Other Brace: R BKA limb guard Restrictions Weight Bearing Restrictions: Yes RLE Weight Bearing: Non weight bearing Other Position/Activity Restrictions: "strict NWB"   Therapy/Group: Individual Therapy [place] PT, DPT   09/07/2019, 12:22 PM

## 2019-09-07 NOTE — Evaluation (Signed)
Occupational Therapy Assessment and Plan  Patient Details  Name: Natalie Contreras MRN: [id number] Date of Birth: 06-19-1966  OT Diagnosis: acute pain and muscle weakness (generalized) Rehab Potential: Rehab Potential (ACUTE ONLY): Good ELOS: 7-10 days   Today's Date: 09/07/2019 OT Individual Time: 2979-8921 OT Individual Time Calculation (min): 66 min     Problem List:  Patient Active Problem List   Diagnosis Date Noted  . S/P BKA (below knee amputation) ([place]) 09/06/2019  . Cutaneous abscess of right foot   . Dehiscence of amputation stump ([place])   . 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 Severe protein-calorie malnutrition ([place])   . Gangrene of right foot ([place])   . Abscess or cellulitis of foot 08/08/2019  . Obesity (BMI 30-39.9) 08/08/2019  . Wound infection 06/13/2019  . Cellulitis 07/17/2017  . Subacute osteomyelitis, right ankle and foot ([place])   . Toe amputation status, left 05/05/2017  . Gangrene of left foot ([place])   . PAD (peripheral artery disease) ([place]) 04/10/2017  . Essential hypertension 04/10/2017  . Chronic diastolic CHF (congestive heart failure) ([place]) 04/10/2017  . AKI (acute kidney injury) ([place]) 04/10/2017  . Foreign body in left foot   . Cellulitis and abscess of foot 04/07/2017  . Carotid stenosis, asymptomatic, bilateral 01/15/2017  . Visual disturbance 01/15/2017  . Tobacco abuse 01/15/2017  . Acute respiratory failure with hypoxia ([place]) 10/30/2016  . Pulmonary hypertension ([place]) 10/29/2016  . Acute diastolic CHF (congestive heart failure) ([place]) 10/28/2016  . Benign essential HTN 10/28/2016  . Hyperlipidemia 10/28/2016  . Diabetic infection of right foot ([place]) 10/28/2016    Past Medical History:  Past Medical History:  Diagnosis Date  . Abscess of foot 08/09/2019   WITH ULCER  RIGHT FOOT  . Acute diastolic CHF (congestive heart failure) ([place]) 10/28/2016  . Carotid stenosis, asymptomatic, bilateral 01/15/2017  . Diabetes mellitus without complication  ([place])   . Heart murmur   . Hypertension   . PAD (peripheral artery disease) ([place]) 04/10/2017   Left ABI 0.74. Right ABI 0.96.   Past Surgical History:  Past Surgical History:  Procedure Laterality Date  . ABDOMINAL AORTOGRAM W/LOWER EXTREMITY N/A 08/10/2019   Procedure: ABDOMINAL AORTOGRAM W/LOWER EXTREMITY;  Surgeon: Waynetta Sandy, MD;  Location: [facility];  Service: Cardiovascular;  Laterality: N/A;  . AMPUTATION Right 09/03/2019   Procedure: RIGHT BELOW KNEE AMPUTATION;  Surgeon: Newt Minion, MD;  Location: [facility];  Service: Orthopedics;  Laterality: Right;  . AMPUTATION TOE Left 05/05/2017   Procedure: LEFT SECOND TOE AMPUTATION;  Surgeon: Aviva Signs, MD;  Location: [facility];  Service: General;  Laterality: Left;  . CATARACT EXTRACTION W/PHACO Left 02/08/2016   Procedure: CATARACT EXTRACTION PHACO AND INTRAOCULAR LENS PLACEMENT (IOC);  Surgeon: Tonny Branch, MD;  Location: [facility];  Service: Ophthalmology;  Laterality: Left;  CDE 11.43   . CATARACT EXTRACTION W/PHACO Right 02/26/2016   Procedure: CATARACT EXTRACTION PHACO AND INTRAOCULAR LENS PLACEMENT RIGHT; CDE:  16.90;  Surgeon: Tonny Branch, MD;  Location: [facility];  Service: Ophthalmology;  Laterality: Right;  . CHOLECYSTECTOMY    . FOREIGN BODY REMOVAL Left 04/09/2017   Procedure: FOREIGN BODY REMOVAL ADULT FOOT;  Surgeon: Aviva Signs, MD;  Location: [facility];  Service: General;  Laterality: Left;  . I & D EXTREMITY Right 08/11/2019   Procedure: IRRIGATION AND DEBRIDEMENT EXTREMITY;  Surgeon: Newt Minion, MD;  Location: [facility];  Service: Orthopedics;  Laterality: Right;  . PERIPHERAL VASCULAR ATHERECTOMY Right 08/10/2019  Procedure: PERIPHERAL VASCULAR ATHERECTOMY;  Surgeon: Waynetta Sandy, MD;  Location: [facility];  Service: Cardiovascular;  Laterality: Right;  superficial femoral  . PERIPHERAL VASCULAR BALLOON ANGIOPLASTY Right 08/10/2019   Procedure: PERIPHERAL VASCULAR BALLOON ANGIOPLASTY;   Surgeon: Waynetta Sandy, MD;  Location: [facility];  Service: Cardiovascular;  Laterality: Right;  anterior tibial  . TRANSMETATARSAL AMPUTATION Left 07/21/2017   Procedure: TRANSMETATARSAL AMPUTATION LEFT FOOT;  Surgeon: Aviva Signs, MD;  Location: [facility];  Service: General;  Laterality: Left;    Assessment & Plan Clinical Impression: Patient is a 53 y.o. year old female with history of T2DM, PAD, ongoing tobacco use who was right foot wound with progressive gangrenous changes and osteomyelitis s/p I and D with partial excision of fifth MT, cuboid and calcaneus on 11/25 with in attempts of limb salvage. She continued to have poor wound healing with gangrenous changes and was readmitted on 09/03/19 for R-BKA by Dr. Sharol Given. Post op therapy evaluations completed and CIR recommended due to functional deficits.  Patient transferred to CIR on 09/06/2019 .    Patient currently requires mod with basic self-care skills secondary to muscle weakness, decreased cardiorespiratoy endurance and decreased standing balance and decreased balance strategies.  Prior to hospitalization, patient could complete ADLs with modified independent .  Patient will benefit from skilled intervention to decrease level of assist with basic self-care skills prior to discharge home with care partner.  Anticipate patient will require 24 hour supervision and follow up home health.  OT - End of Session Activity Tolerance: Tolerates 30+ min activity with multiple rests Endurance Deficit: Yes Endurance Deficit Description: required rest breaks during self-care tasks OT Assessment Rehab Potential (ACUTE ONLY): Good OT Patient demonstrates impairments in the following area(s): Balance;Endurance;Motor;Pain;Safety;Skin Integrity OT Basic ADL's Functional Problem(s): Grooming;Bathing;Dressing;Toileting OT Advanced ADL's Functional Problem(s): Simple Meal Preparation OT Transfers Functional Problem(s):  Toilet;Tub/Shower OT Additional Impairment(s): None OT Plan OT Intensity: Minimum of 1-2 x/day, 45 to 90 minutes OT Frequency: 5 out of 7 days OT Duration/Estimated Length of Stay: 7-10 days OT Treatment/Interventions: Balance/vestibular training;Discharge planning;Disease Lawyer;Functional mobility training;Pain management;Patient/family education;Psychosocial support;Self Care/advanced ADL retraining;Skin care/wound managment;Splinting/orthotics;Therapeutic Activities;Therapeutic Exercise;UE/LE Strength taining/ROM;Wheelchair propulsion/positioning OT Basic Self-Care Anticipated Outcome(s): Mod I OT Toileting Anticipated Outcome(s): Mod I OT Bathroom Transfers Anticipated Outcome(s): Mod I OT Recommendation Patient destination: Home Follow Up Recommendations: Home health OT;24 hour supervision/assistance Equipment Recommended: Tub/shower bench   Skilled Therapeutic Intervention OT eval completed with discussion of rehab process, OT purpose, POC, ELOS, and goals.  ADL assessment completed with focus on functional transfers, standing balance, and education on adaptive techniques.  Pt received on [facility] with RN present.  Completed stand pivot back to recliner with min assist.  Bathing and dressing completed at sit > stand level at sink.  Mod assist for sit > stand as pt fatigued.  Therapist washed buttocks and assisted with threading RLE and pulling pants over hips.  Pt noticeably more fatigued with each subsequent sit > stand and transfer.  Ultimately utilized slide board for transfer w/c > recliner with min assist as pt unable to complete stand pivot or squat pivot due to LLE weakness at end of session.     OT Evaluation Precautions/Restrictions  Precautions Precautions: Fall Precaution Comments: wound VAC Restrictions Weight Bearing Restrictions: Yes RLE Weight Bearing: Non weight bearing Other Position/Activity Restrictions: "strict  NWB" General   Vital Signs Therapy Vitals Temp: 97.7 F (36.5 C) Temp Source: Oral Pulse Rate: 67 Resp: 17 BP: [name])  137/46 Patient Position (if appropriate): Sitting Oxygen Therapy SpO2: 99 % O2 Device: Room Air Pain Pain Assessment Pain Scale: 0-10 Pain Score: 0-No pain Home Living/Prior Functioning Home Living Available Help at Discharge: Family, Available PRN/intermittently Type of Home: Apartment Home Access: Stairs to enter [employer] of Steps: 1 curb step +threshold Home Layout: One level Bathroom Shower/Tub: Tub/shower unit, Architectural technologist: Programmer, systems: No(not w/c accessible)  Lives With: Daughter IADL History Homemaking Responsibilities: Yes(did majority of cooking and housekeeping until first surgery in Nov) Current License: Yes Prior Function Level of Independence: Independent with basic ADLs, Independent with transfers Driving: Yes(up until surgery in Nov) Comments: uses a w/c for mobility, can perform transfers independently ADL ADL Grooming: Setup Where Assessed-Grooming: Sitting at sink Upper Body Bathing: Setup Where Assessed-Upper Body Bathing: Sitting at sink Lower Body Bathing: Moderate assistance Where Assessed-Lower Body Bathing: Sitting at sink, Standing at sink Upper Body Dressing: Setup Where Assessed-Upper Body Dressing: Sitting at sink Lower Body Dressing: Moderate assistance Where Assessed-Lower Body Dressing: Sitting at sink, Standing at sink Toileting: Maximal assistance Where Assessed-Toileting: Bedside Commode Toilet Transfer: Minimal assistance, Moderate assistance Toilet Transfer Method: Stand pivot Toilet Transfer Equipment: Bedside commode Vision Baseline Vision/History: Wears glasses Wears Glasses: Reading only Patient Visual Report: No change from baseline Vision Assessment?: No apparent visual deficits Cognition Overall Cognitive Status: Within Functional Limits for tasks  assessed Arousal/Alertness: Awake/alert Orientation Level: Person;Place;Situation Person: Oriented Place: Oriented Situation: Oriented Year: 2020 Month: December Day of Week: Correct Memory: Appears intact Immediate Memory Recall: Sock;Blue;Bed Memory Recall Sock: Without Cue Memory Recall Blue: Without Cue Memory Recall Bed: Without Cue Attention: Selective Selective Attention: Appears intact Awareness: Appears intact Problem Solving: Appears intact Safety/Judgment: Appears intact Sensation Sensation Light Touch: Appears Intact Coordination Gross Motor Movements are Fluid and Coordinated: No Fine Motor Movements are Fluid and Coordinated: Yes Finger Nose Finger Test: WNL Mobility  Transfers Sit to Stand: Moderate Assistance - Patient 50-74%  Extremity/Trunk Assessment RUE Assessment RUE Assessment: Within Functional Limits General Strength Comments: grossly 5/5 LUE Assessment LUE Assessment: Within Functional Limits General Strength Comments: grossly 5/5     Refer to Care Plan for Long Term Goals  Recommendations for other services: None    Discharge Criteria: Patient will be discharged from OT if patient refuses treatment 3 consecutive times without medical reason, if treatment goals not met, if there is a change in medical status, if patient makes no progress towards goals or if patient is discharged from hospital.  The above assessment, treatment plan, treatment alternatives and goals were discussed and mutually agreed upon: by patient  Simonne Come 09/07/2019, 8:32 AM

## 2019-09-07 NOTE — Progress Notes (Signed)
Initial Nutrition Assessment  DOCUMENTATION CODES:   Obesity unspecified  INTERVENTION:   - Please obtain CIR admission weight  - Continue Pro-stat 30 ml po BID, each supplement provides 100 kcal and 15 grams of protein  - MVI with minerals daily  NUTRITION DIAGNOSIS:   Increased nutrient needs related to wound healing as evidenced by estimated needs.  GOAL:   Patient will meet greater than or equal to 90% of their needs  MONITOR:   PO intake, Supplement acceptance, Labs, Weight trends, Skin  REASON FOR ASSESSMENT:   Malnutrition Screening Tool    ASSESSMENT:   53 year old female with PMH of T2DM, PAD, ongoing tobacco use. Pt with right foot wound with progressive gangrenous changes and osteomyelitis s/p I&D with partial excision of fifth MT, cuboid and calcaneus on 11/25 with in attempts of limb salvage. Pt continued to have poor wound healing with gangrenous changes and was readmitted on 09/03/19 for right BKA. Pt admitted to CIR on 09/06/19.   Spoke with pt and daughter Julianne Rice at bedside. Pt reports that her appetite is fine and that has slowly been getting back to normal after her surgery. Pt reports that she is hungry for meals but is also making herself eat (especially the protein foods) because she knows how important protein is. RD encouraged pt to continue to eat well at mealtimes.  Pt states she is taking the Pro-stat and is willing to continue doing so.  Pt is unsure whether she has experienced any weight changes other than the weight loss attributed to being s/p BKA. Pt reports her UBW is 212 lbs. No admission weight available.  RD will also order daily MVI with minerals to aid in wound healing.  Meal Completion: 90-100%  Medications reviewed and include: colace, Pro-stat BID, SSI, Novolog 4 units TID, Levemir 23 units daily, metformin, torsemide  Labs reviewed: BUN 47, creatinine 1.21 CBG's: 75-157 x 24 hours  NUTRITION - FOCUSED PHYSICAL EXAM:    Most  Recent Value  Orbital Region  No depletion  Upper Arm Region  No depletion  Thoracic and Lumbar Region  No depletion  Buccal Region  No depletion  Temple Region  No depletion  Clavicle Bone Region  No depletion  Clavicle and Acromion Bone Region  Mild depletion  Scapular Bone Region  No depletion  Dorsal Hand  No depletion  Patellar Region  No depletion  Anterior Thigh Region  No depletion  Posterior Calf Region  Mild depletion  Edema (RD Assessment)  None  Hair  Reviewed  Eyes  Reviewed  Mouth  Reviewed  Skin  Reviewed  Nails  Reviewed       Diet Order:   Diet Order            Diet Carb Modified Fluid consistency: Thin; Room service appropriate? Yes  Diet effective now              EDUCATION NEEDS:   Education needs have been addressed  Skin:  Skin Assessment: Skin Integrity Issues: Skin Integrity Issues: Wound Vac: right leg  Last BM:  09/04/19  Height:   Ht Readings from Last 1 Encounters:  09/03/19 5\' 4"  (1.626 m)    Weight:   Wt Readings from Last 1 Encounters:  09/03/19 96.2 kg    Ideal Body Weight:  51 kg (adjusted for BKA)  BMI:  38.6 kg/m^2 (adjusted for BKA)  Estimated Nutritional Needs:   Kcal:  2000-2200  Protein:  110-130 grams  Fluid:  >/= 2.0 L  Kate Jablonski Kayia Billinger, MS, RD, LDN Inpatient Clinical Dietitian Pager: [phone] Weekend/After Hours: [phone]  

## 2019-09-07 NOTE — Care Management Note (Signed)
[facility] Individual Statement of Services  Patient Name:  Natalie Contreras  Date:  09/07/2019  Welcome to the [facility].  Our goal is to provide you with an individualized program based on your diagnosis and situation, designed to meet your specific needs.  With this comprehensive rehabilitation program, you will be expected to participate in at least 3 hours of rehabilitation therapies Monday-Friday, with modified therapy programming on the weekends.  Your rehabilitation program will include the following services:  Physical Therapy (PT), Occupational Therapy (OT), 24 hour per day rehabilitation nursing, Case Management (Social Worker), Rehabilitation Medicine, Nutrition Services and Pharmacy Services  Weekly team conferences will be held on Wednesday to discuss your progress.  Your Social Worker will talk with you frequently to get your input and to update you on team discussions.  Team conferences with you and your family in attendance may also be held.  Expected length of stay: 7-10 days  Overall anticipated outcome: independent with wheelchair-supervision 15 ft with gait  Depending on your progress and recovery, your program may change. Your Social Worker will coordinate services and will keep you informed of any changes. Your Social Worker's name and contact numbers are listed  below.  The following services may also be recommended but are not provided by the [facility]:    [facility] will be made to provide these services after discharge if needed.  Arrangements include referral to agencies that provide these services.  Your insurance has been verified to be:  medicaid Your primary doctor is:  Allyn Kenner  Pertinent information will be shared with your doctor and your insurance company.  Social Worker:  Ovidio Kin, [place] or ([phone]  Information discussed with and copy given to patient by: Elease Hashimoto, 09/07/2019, 9:43 AM

## 2019-09-07 NOTE — IPOC Note (Signed)
Individualized overall Plan of Care [facility]) Patient Details Name: Natalie Contreras MRN: [id number] DOB: 07/02/1966  Admitting Diagnosis: S/P BKA (below knee amputation) [facility])  Hospital Problems: Principal Problem:   S/P BKA (below knee amputation) ([place]) Active Problems:   Obesity (BMI 30-39.9)   Morbid obesity ([place])   Acute blood loss anemia   Hypoalbuminemia due to protein-calorie malnutrition ([facility])   Stage 3 chronic kidney disease   Diabetic peripheral neuropathy ([place])     Functional Problem List: Nursing Medication Management, Pain, Nutrition, Safety, Skin Integrity, Edema  PT Balance, Endurance, Motor, Sensory  OT Balance, Endurance, Motor, Pain, Safety, Skin Integrity  SLP    TR         Basic ADL's: OT Grooming, Bathing, Dressing, Toileting     Advanced  ADL's: OT Simple Meal Preparation     Transfers: PT Bed Mobility, Bed to Chair, Car, Manufacturing systems engineer, Metallurgist: PT Ambulation, Emergency planning/management officer, Stairs     Additional Impairments: OT None  SLP        TR      Anticipated Outcomes Item Anticipated Outcome  Self Feeding    Swallowing      Basic self-care  Mod I  Toileting  Mod I   Bathroom Transfers Supervision  Bowel/Bladder  n/a  Transfers  Mod I with LRAD  Locomotion  Supervision with LRAD  Communication     Cognition     Pain  Pain at or below level 5  Safety/Judgment  maintain safety with cues/reminders   Therapy Plan: PT Intensity: Minimum of 1-2 x/day ,45 to 90 minutes PT Frequency: 5 out of 7 days PT Duration Estimated Length of Stay: 7-10 days OT Intensity: Minimum of 1-2 x/day, 45 to 90 minutes OT Frequency: 5 out of 7 days OT Duration/Estimated Length of Stay: 7-10 days      Team Interventions: Nursing Interventions Patient/Family Education, Disease Management/Prevention, Pain Management, Medication Management, Skin Care/Wound Management, Discharge Planning  PT interventions Ambulation/gait  training, Discharge planning, Functional mobility training, Psychosocial support, Therapeutic Activities, Balance/vestibular training, Disease management/prevention, Neuromuscular re-education, Therapeutic Exercise, Wheelchair propulsion/positioning, DME/adaptive equipment instruction, Pain management, UE/LE Strength taining/ROM, Academic librarian, Barrister's clerk education, IT trainer, UE/LE Coordination activities  OT Interventions Training and development officer, Discharge planning, Disease mangement/prevention, Engineer, drilling, Functional mobility training, Pain management, Patient/family education, Psychosocial support, Self Care/advanced ADL retraining, Skin care/wound managment, Splinting/orthotics, Therapeutic Activities, Therapeutic Exercise, UE/LE Strength taining/ROM, Wheelchair propulsion/positioning  SLP Interventions    TR Interventions    SW/CM Interventions Discharge Planning, Psychosocial Support, Patient/Family Education   Barriers to Discharge MD  Medical stability, Wound care, Weight, and Weight bearing restrictions  Nursing      PT Medical stability, Wound Care, Weight bearing restrictions Wound vac  OT      SLP      SW       Team Discharge Planning: Destination: PT-Home ,OT- Home , SLP-  Projected Follow-up: PT-Home health PT, OT-  Home health OT, 24 hour supervision/assistance, SLP-  Projected Equipment Needs: PT-To be determined, OT- Tub/shower bench, SLP-  Equipment Details: PT-Pt has 2 manual WC and slideboard, OT-  Patient/family involved in discharge planning: PT- Patient,  OT-Patient, SLP-   MD ELOS: 6-10 days. Medical Rehab Prognosis:  Good Assessment: 53 year old female with history of T2DM, PAD, ongoing tobacco use who was right foot wound with progressive gangrenous changes and osteomyelitis s/p I and D with partial excision of fifth MT, cuboid and calcaneus on 11/25 with  in attempts of limb salvage. She continued to have poor wound  healing with gangrenous changes and was readmitted on 09/03/19 for R-BKA by Dr. Sharol Given. Patient with resulting functional deficits with mobility, transfers, self-care. Will set goals for Mid I/Supervision with PT/OT.    Due to the current state of emergency, patients may not be receiving their 3-hours of Medicare-mandated therapy.  See Team Conference Notes for weekly updates to the plan of care

## 2019-09-07 NOTE — Progress Notes (Signed)
Social Work Assessment and Plan   Patient Details  Name: Natalie Contreras MRN: [id number] Date of Birth: 1966/04/02  Today's Date: 09/07/2019  Problem List:  Patient Active Problem List   Diagnosis Date Noted  . S/P BKA (below knee amputation) ([place]) 09/06/2019  . Cutaneous abscess of right foot   . Dehiscence of amputation stump ([place])   . 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 Severe protein-calorie malnutrition ([place])   . Gangrene of right foot ([place])   . Abscess or cellulitis of foot 08/08/2019  . Obesity (BMI 30-39.9) 08/08/2019  . Wound infection 06/13/2019  . Cellulitis 07/17/2017  . Subacute osteomyelitis, right ankle and foot ([place])   . Toe amputation status, left 05/05/2017  . Gangrene of left foot ([place])   . PAD (peripheral artery disease) ([place]) 04/10/2017  . Essential hypertension 04/10/2017  . Chronic diastolic CHF (congestive heart failure) ([place]) 04/10/2017  . AKI (acute kidney injury) ([place]) 04/10/2017  . Foreign body in left foot   . Cellulitis and abscess of foot 04/07/2017  . Carotid stenosis, asymptomatic, bilateral 01/15/2017  . Visual disturbance 01/15/2017  . Tobacco abuse 01/15/2017  . Acute respiratory failure with hypoxia ([place]) 10/30/2016  . Pulmonary hypertension ([place]) 10/29/2016  . Acute diastolic CHF (congestive heart failure) ([place]) 10/28/2016  . Benign essential HTN 10/28/2016  . Hyperlipidemia 10/28/2016  . Diabetic infection of right foot ([place]) 10/28/2016   Past Medical History:  Past Medical History:  Diagnosis Date  . Abscess of foot 08/09/2019   WITH ULCER  RIGHT FOOT  . Acute diastolic CHF (congestive heart failure) ([place]) 10/28/2016  . Carotid stenosis, asymptomatic, bilateral 01/15/2017  . Diabetes mellitus without complication ([place])   . Heart murmur   . Hypertension   . PAD (peripheral artery disease) ([place]) 04/10/2017   Left ABI 0.74. Right ABI 0.96.   Past Surgical History:  Past Surgical History:  Procedure Laterality Date  . ABDOMINAL  AORTOGRAM W/LOWER EXTREMITY N/A 08/10/2019   Procedure: ABDOMINAL AORTOGRAM W/LOWER EXTREMITY;  Surgeon: Waynetta Sandy, MD;  Location: [facility];  Service: Cardiovascular;  Laterality: N/A;  . AMPUTATION Right 09/03/2019   Procedure: RIGHT BELOW KNEE AMPUTATION;  Surgeon: Newt Minion, MD;  Location: [facility];  Service: Orthopedics;  Laterality: Right;  . AMPUTATION TOE Left 05/05/2017   Procedure: LEFT SECOND TOE AMPUTATION;  Surgeon: Aviva Signs, MD;  Location: [facility];  Service: General;  Laterality: Left;  . CATARACT EXTRACTION W/PHACO Left 02/08/2016   Procedure: CATARACT EXTRACTION PHACO AND INTRAOCULAR LENS PLACEMENT (IOC);  Surgeon: Tonny Branch, MD;  Location: [facility];  Service: Ophthalmology;  Laterality: Left;  CDE 11.43   . CATARACT EXTRACTION W/PHACO Right 02/26/2016   Procedure: CATARACT EXTRACTION PHACO AND INTRAOCULAR LENS PLACEMENT RIGHT; CDE:  16.90;  Surgeon: Tonny Branch, MD;  Location: [facility];  Service: Ophthalmology;  Laterality: Right;  . CHOLECYSTECTOMY    . FOREIGN BODY REMOVAL Left 04/09/2017   Procedure: FOREIGN BODY REMOVAL ADULT FOOT;  Surgeon: Aviva Signs, MD;  Location: [facility];  Service: General;  Laterality: Left;  . I & D EXTREMITY Right 08/11/2019   Procedure: IRRIGATION AND DEBRIDEMENT EXTREMITY;  Surgeon: Newt Minion, MD;  Location: [facility];  Service: Orthopedics;  Laterality: Right;  . PERIPHERAL VASCULAR ATHERECTOMY Right 08/10/2019   Procedure: PERIPHERAL VASCULAR ATHERECTOMY;  Surgeon: Waynetta Sandy, MD;  Location: [facility];  Service: Cardiovascular;  Laterality: Right;  superficial femoral  . PERIPHERAL VASCULAR BALLOON ANGIOPLASTY Right 08/10/2019  Procedure: PERIPHERAL VASCULAR BALLOON ANGIOPLASTY;  Surgeon: Waynetta Sandy, MD;  Location: [facility];  Service: Cardiovascular;  Laterality: Right;  anterior tibial  . TRANSMETATARSAL AMPUTATION Left 07/21/2017   Procedure: TRANSMETATARSAL AMPUTATION  LEFT FOOT;  Surgeon: Aviva Signs, MD;  Location: [facility];  Service: General;  Laterality: Left;   Social History:  reports that she has been smoking cigarettes. She started smoking about 34 years ago. She has a 12.50 pack-year smoking history. She has never used smokeless tobacco. She reports that she does not drink alcohol or use drugs.  Family / Support Systems Marital Status: Single Patient Roles: Parent, Other (Comment)(Friend) Children: [name]-daughter [phone] Other Supports: Izora Gala Trejo-friend [phone] Anticipated Caregiver: Daughter and friend Ability/Limitations of Caregiver: Daughter works but friend will be with her while daughter works Careers adviser: 24/7 Family Dynamics: Close knit with daughter and friend, both have been assisting with her care since she has had her foot issues. She feels she is grateful and will do what she needs to do to be as independent as she can be.  Social History Preferred language: English Religion: Baptist Cultural Background: No issues Education: HS Read: Yes Write: Yes Employment Status: Disabled Public relations account executive Issues: No issues Guardian/Conservator: none-according to MD pt is capable of making her own decisions while here.   Abuse/Neglect Abuse/Neglect Assessment Can Be Completed: Yes Physical Abuse: Denies Verbal Abuse: Denies Sexual Abuse: Denies Exploitation of patient/patient's resources: Denies Self-Neglect: Denies  Emotional Status Pt's affect, behavior and adjustment status: Pt is motivated to do well and get back to mod/i wheelchair level. She was having to use a wheelchair prior to admission due to how bad her foot was and painful. She feels if she can get back to this level she can go home with her caregivers. Recent Psychosocial Issues: other health issues-mainly her foot and leg Psychiatric History: No history deferred depression screen due to coping appropriately and has been dealing with  this issue for months. If here long enough would benefit from seeing neuro-psych while here Substance Abuse History: Tobacco aware of the risks and plans to quit. Aware of the resources available to assist her with this  Patient / Family Perceptions, Expectations & Goals Pt/Family understanding of illness & functional limitations: Pt is able to explain her amputation and issues. She knew this was coming and is accepting this. She talks with the MD daily and feels she has a good understanding of her treatment plan going forward and hopeful she will be here a short time. Premorbid pt/family roles/activities: mom, friend, retiree, etc Anticipated changes in roles/activities/participation: resume Pt/family expectations/goals: Pt states: " I hope to get back to the level where I was before and then go home."  Friend states: " I will help her in anyway I can."  [employer]: Other (Comment) Premorbid Home Care/DME Agencies: Other (Comment)(Active with [facility] and has wc, bsc) Transportation available at discharge: Daughter and friend Resource referrals recommended: Neuropsychology  Discharge Planning Living Arrangements: Children Support Systems: Children, Friends/neighbors Type of Residence: Private residence Insurance Resources: [employer] (specify county) Pensions consultant: SSI, Family Support Financial Screen Referred: No Living Expenses: Education officer, community Management: Patient, Family Does the patient have any problems obtaining your medications?: No Home Management: Both she and daughter Patient/Family Preliminary Plans: return home with her daughter who does work during the day but her friend will stay withnher like she did prior to admission. She will have 24 hr care if needed. She is hopeful she will get  to mod/i wheelchair level where she was prior to admission before going home. Social Work Anticipated Follow Up Needs: HH/OP, Support Group  Clinical Impression Pleasant  female who is motivated to improve and get back to her current level. She has good support via daughter and friend and if needed will have 24 hr care. She is hopeful she will be a short length of stay, await therapy evaluations today.  Elease Hashimoto 09/07/2019, 9:41 AM

## 2019-09-07 NOTE — Progress Notes (Signed)
[facility] PHYSICAL MEDICINE & REHABILITATION PROGRESS NOTE  Subjective/Complaints: Patient seen sitting up in her chair this morning working with therapies.  She states she did not sleep well overnight, but that is common for her.  She is ready to begin therapies.  ROS: Denies CP, shortness of breath, nausea, vomiting, diarrhea.  Objective: Vital Signs: Blood pressure (!) 137/46, pulse 67, temperature 97.7 F (36.5 C), temperature source Oral, resp. rate 17, last menstrual period 12/16/2015, SpO2 99 %. No results found. Recent Labs    09/07/19 0528  WBC 6.5  HGB 10.4*  HCT 33.6*  PLT 251   Recent Labs    09/07/19 0528  NA 139  K 3.7  CL 99  CO2 28  GLUCOSE 138*  BUN 47*  CREATININE 1.21*  CALCIUM 8.8*    Physical Exam: BP (!) 137/46 (BP Location: Right Arm)   Pulse 67   Temp 97.7 F (36.5 C) (Oral)   Resp 17   LMP 12/16/2015   SpO2 99%  Constitutional: No distress . Vital signs reviewed. HENT: Normocephalic.  Atraumatic. Eyes: EOMI. No discharge. Cardiovascular: No JVD. Respiratory: Normal effort.  No stridor. GI: Non-distended. Skin: +VAC Psych: Normal mood.  Normal behavior. Musc: Right BKA with edema and tenderness Left foot amputation of digits two through five Neuro: Alert Motor: Bilateral upper extremities: 5/5 proximal distal Right lower extremity: Hip flexion, knee extension 4 -/5 (pain inhibition) Left lower extremity: 4+/5 proximal distal  Assessment/Plan: 1. 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  Physiatrist is providing close team supervision and 24 hour management of active medical problems listed below.  Physiatrist and rehab team continue to assess barriers to discharge/monitor patient progress toward functional and medical goals  Care Tool:  Bathing    Body parts bathed by  patient: Right arm, Left arm, Chest, Abdomen, Front perineal area, Right upper leg, Left upper leg, Face   Body parts bathed by helper: Buttocks, Left lower leg     Bathing assist Assist Level: Moderate Assistance - Patient 50 - 74%     Upper Body Dressing/Undressing Upper body dressing   What is the patient wearing?: Pull over shirt    Upper body assist Assist Level: Set up assist    Lower Body Dressing/Undressing Lower body dressing      What is the patient wearing?: Pants     Lower body assist Assist for lower body dressing: Moderate Assistance - Patient 50 - 74%     Toileting Toileting    Toileting assist Assist for toileting: Maximal Assistance - Patient 25 - 49%     Transfers Chair/bed transfer  Transfers assist     Chair/bed transfer assist level: Moderate Assistance - Patient 50 - 74%     Locomotion Ambulation   Ambulation assist   Ambulation activity did not occur: Refused(Pt refused due to fear of falling, and LLE weakness)          Walk 10 feet activity   Assist  Walk 10 feet activity did not occur: Refused(Pt refused due to fear of falling, and LLE weakness)        Walk 50 feet activity   Assist Walk 50 feet with 2 turns activity did not occur: Refused(Pt refused due to fear of falling, and LLE weakness)         Walk 150 feet activity   Assist Walk 150  feet activity did not occur: Refused(Pt refused due to fear of falling, and LLE weakness)         Walk 10 feet on uneven surface  activity   Assist Walk 10 feet on uneven surfaces activity did not occur: Refused(Pt refused due to fear of falling, and LLE weakness)         Wheelchair     Assist Will patient use wheelchair at discharge?: Yes Type of Wheelchair: Manual    Wheelchair assist level: Supervision/Verbal cueing Max wheelchair distance: 66ft    Wheelchair 50 feet with 2 turns activity    Assist        Assist Level: Supervision/Verbal cueing    Wheelchair 150 feet activity     Assist Wheelchair 150 feet activity did not occur: Safety/medical concerns(fatigue, increased discomfort in bilateral UE's)          Medical Problem List and Plan: 1.  Impaired mobility and ADLs secondary to Right BKA  Begin CIR evaluations 2.  Antithrombotics: -DVT/anticoagulation:  Pharmaceutical: Lovenox             -antiplatelet therapy: ASA 3. Pain Management: Oxycodone prn.  4. Mood: LCSW to follow for evaluation and support.              -antipsychotic agents: N/A 5. Neuropsych: This patient is capable of making decisions on her own behalf. 6. Skin/Wound Care: Routine pressure relief measures.  Added protein supplement to promote wound healing.  7. Fluids/Electrolytes/Nutrition: Monitor I/O.  8. HTN: Continue to monitor BP   Cont coreg bid   Hold Demadex daily due to BUN  Monitor with increased mobility 9. T2DM with neuropathy/hyperglycemia: Monitor BS ac/hs. On Levemir at bedtime and  Metformin/canaflifozin in am.   Monitor with increased mobility 10.  CKD: Baseline SCr around 1.3 per record. Continue to encourage fluid intake.   Creatinine 1.21 on 12/22, however BUN trending up  Holding diuretic  Continue to monitor 11.  Hypoalbuminemia  Supplement initiated 12.  Acute blood loss anemia  Hemoglobin 10.4 on 12/22  Continue to monitor 13.  Morbid obesity  Encourage weight loss  LOS: 1 days A FACE TO FACE EVALUATION WAS PERFORMED  Natalie Contreras Lorie Phenix 09/07/2019, 12:32 PM

## 2019-09-08 ENCOUNTER — Inpatient Hospital Stay (HOSPITAL_COMMUNITY): Payer: Medicaid Other

## 2019-09-08 DIAGNOSIS — R0989 Other specified symptoms and signs involving the circulatory and respiratory systems: Secondary | ICD-10-CM

## 2019-09-08 DIAGNOSIS — R7309 Other abnormal glucose: Secondary | ICD-10-CM

## 2019-09-08 LAB — GLUCOSE, CAPILLARY
Glucose-Capillary: 91 mg/dL (ref 70–99)
Glucose-Capillary: 146 mg/dL — ABNORMAL HIGH (ref 70–99)
Glucose-Capillary: 127 mg/dL — ABNORMAL HIGH (ref 70–99)
Glucose-Capillary: 197 mg/dL — ABNORMAL HIGH (ref 70–99)

## 2019-09-08 NOTE — Progress Notes (Signed)
[facility] PHYSICAL MEDICINE & REHABILITATION PROGRESS NOTE  Subjective/Complaints: Patient seen sitting up at the edge of her bed this morning.  She states she did not sleep well overnight, baseline.  She states she had a good first day of therapies yesterday.  ROS: Denies CP, shortness of breath, nausea, vomiting, diarrhea.  Objective: Vital Signs: Blood pressure (!) 154/60, pulse 70, temperature 98.2 F (36.8 C), resp. rate 18, last menstrual period 12/16/2015, SpO2 94 %. No results found. Recent Labs    09/07/19 0528  WBC 6.5  HGB 10.4*  HCT 33.6*  PLT 251   Recent Labs    09/07/19 0528  NA 139  K 3.7  CL 99  CO2 28  GLUCOSE 138*  BUN 47*  CREATININE 1.21*  CALCIUM 8.8*    Physical Exam: BP (!) 154/60   Pulse 70   Temp 98.2 F (36.8 C)   Resp 18   LMP 12/16/2015   SpO2 94%  Constitutional: No distress . Vital signs reviewed. HENT: Normocephalic.  Atraumatic. Eyes: EOMI. No discharge. Cardiovascular: No JVD. Respiratory: Normal effort.  No stridor. GI: Non-distended. Skin: + VAC. Psych: Normal mood.  Normal behavior. Musc: Right BKA with edema and tenderness Left foot amputations of digits 2 through 5 Neuro: Alert Motor: Bilateral upper extremities: 5/5 proximal distal Right lower extremity: Hip flexion, knee extension 4 -/5 (pain inhibition), unchanged Left lower extremity: 4+/5 proximal distal, unchanged  Assessment/Plan: 1. 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  Physiatrist is providing close team supervision and 24 hour management of active medical problems listed below.  Physiatrist and rehab team continue to assess barriers to discharge/monitor patient progress toward functional and medical goals  Care Tool:  Bathing    Body parts bathed by patient: Right arm, Left arm, Chest, Abdomen,  Front perineal area, Right upper leg, Left upper leg, Face   Body parts bathed by helper: Buttocks, Left lower leg     Bathing assist Assist Level: Moderate Assistance - Patient 50 - 74%     Upper Body Dressing/Undressing Upper body dressing   What is the patient wearing?: Pull over shirt    Upper body assist Assist Level: Set up assist    Lower Body Dressing/Undressing Lower body dressing      What is the patient wearing?: Pants     Lower body assist Assist for lower body dressing: Moderate Assistance - Patient 50 - 74%     Toileting Toileting    Toileting assist Assist for toileting: Maximal Assistance - Patient 25 - 49%     Transfers Chair/bed transfer  Transfers assist     Chair/bed transfer assist level: Moderate Assistance - Patient 50 - 74%     Locomotion Ambulation   Ambulation assist   Ambulation activity did not occur: Refused(Pt refused due to fear of falling, and LLE weakness)          Walk 10 feet activity   Assist  Walk 10 feet activity did not occur: Refused(Pt refused due to fear of falling, and LLE weakness)        Walk 50 feet activity   Assist Walk 50 feet with 2 turns activity did not occur: Refused(Pt refused due to fear of falling, and LLE weakness)         Walk 150 feet activity   Assist Walk 150 feet activity did not occur:  Refused(Pt refused due to fear of falling, and LLE weakness)         Walk 10 feet on uneven surface  activity   Assist Walk 10 feet on uneven surfaces activity did not occur: Refused(Pt refused due to fear of falling, and LLE weakness)         Wheelchair     Assist Will patient use wheelchair at discharge?: Yes Type of Wheelchair: Manual    Wheelchair assist level: Supervision/Verbal cueing Max wheelchair distance: 51ft    Wheelchair 50 feet with 2 turns activity    Assist        Assist Level: Supervision/Verbal cueing   Wheelchair 150 feet activity      Assist Wheelchair 150 feet activity did not occur: Safety/medical concerns(fatigue, increased discomfort in bilateral UE's)          Medical Problem List and Plan: 1.  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  Continue CIR  Team conference today to discuss current and goals and coordination of care, home and environmental barriers, and discharge planning with nursing, case manager, and therapies.  2.  Antithrombotics: -DVT/anticoagulation:  Pharmaceutical: Lovenox             -antiplatelet therapy: ASA 3. Pain Management: Oxycodone prn.  4. Mood: LCSW to follow for evaluation and support.              -antipsychotic agents: N/A 5. Neuropsych: This patient is capable of making decisions on her own behalf. 6. Skin/Wound Care: Routine pressure relief measures.  Added protein supplement to promote wound healing.  7. Fluids/Electrolytes/Nutrition: Monitor I/O.  8. HTN: Continue to monitor BP   Cont coreg bid   Hold Demadex daily due to BUN  Labile on 12/23, monitor for trend  Monitor with increased mobility 9. T2DM with neuropathy/hyperglycemia: Monitor BS ac/hs. On Levemir at bedtime and  Metformin/canaflifozin in am.   Slightly labile on 12/23  Monitor with increased mobility 10.  CKD: Baseline SCr around 1.3 per record. Continue to encourage fluid intake.   Creatinine 1.21 on 12/22, however BUN trending up, plan to order labs later this week  Holding diuretic  Continue to monitor 11.  Hypoalbuminemia  Supplement initiated 12.  Acute blood loss anemia  Hemoglobin 10.4 on 12/22, plan to order labs later this week  Continue to monitor 13.  Morbid obesity  Encourage weight loss  LOS: 2 days A FACE TO FACE EVALUATION WAS PERFORMED  Natalie Contreras 09/08/2019, 9:29 AM

## 2019-09-08 NOTE — Progress Notes (Signed)
Occupational Therapy Session Note  Patient Details  Name: Natalie Contreras MRN: [id number] Date of Birth: [birth year]  Today's Date: 09/08/2019 OT Individual Time: 0800-0900 OT Individual Time Calculation (min): 60 min    Short Term Goals: Week 1:  OT Short Term Goal 1 (Week 1): STG = LTGs due to ELOS  Skilled Therapeutic Interventions/Progress Updates:    Pt sitting EOB upon arrival and ready for therapy.  Pt declined bathing or changing clothing this morning.  Attempted to don LLE limb guard.  When correctly placed, limb guard reaches to pt's groin.  Limb guard removed and pt's PT notified to contact [facility]. Pt transferred to w/c with min A and completed grooming tasks at sink.  Pt propelled w/c to nursing station and therapist assisted for time management to tub room.  Educated pt on TTB but pt stated her bathroom is too small.  Pt also stated she could not access bathroom with w/c or walker. Pt also commented that she slept in recliner. Pt issued w/c gloves.  Pt propelled w/c approx 50' and navigated doorways. Pt fatigues quickly. Pt returned to room and transferred to recliner (mod A). Pt remained in recliner with all needs within reach and seat alarm activated.   Therapy Documentation Precautions:  Precautions Precautions: Fall Precaution Comments: R BKA, wound vac Required Braces or Orthoses: Other Brace Other Brace: R BKA limb guard Restrictions Weight Bearing Restrictions: Yes RLE Weight Bearing: Non weight bearing Other Position/Activity Restrictions: "strict NWB"   Pain: Pt denies pain this morning but did c/o nausea (RN aware)   Other Treatments:     Therapy/Group: Individual Therapy  Leroy Libman 09/08/2019, 9:04 AM

## 2019-09-08 NOTE — Progress Notes (Signed)
Physical Therapy Session Note  Patient Details  Name: Natalie Contreras MRN: [id number] Date of Birth: 1965/12/06  Today's Date: 09/08/2019 PT Individual Time: 1505-1530 PT Individual Time Calculation (min): 25 min   Short Term Goals: Week 1:  PT Short Term Goal 1 (Week 1): Pt will perform car transfer with LRAD CGA PT Short Term Goal 2 (Week 1): Pt will initiate gait training with LRAD PT Short Term Goal 3 (Week 1): Pt will perform stand<>pivot transfer with LRAD CGA  Skilled Therapeutic Interventions/Progress Updates:  Pt seated in recliner; limb protector doffed.  Shrinker sock noted to be rolling up in popliteal area; PT unable to keep it up from rolling back up in back.  She stated that limb protector is too long, and presses up into her groin.  She rated pain residual limb 8/10.  Pt urged her to request pain meds, which she did. Pt stated that she had been waiting until she had a snack.  PT brought graham crackers which pt had a couple bites of.  PT educated pt on importance of keeping "ahead of pain" ; she verbalized understanding.  [name], RN dispensed meds during session.  PT issued BKA exercise hand-out.  Using hand-out, with cues, pt performed in reclined recliner: 10 x 1 with 5 second holds: bil glut sets, R hip extension over towel roll.  In sitting upright in recliner: 10 x 1 with 5 second holds: R short arc quad knee extension.   [name], [facility] arrived to adjust limb protector.  He rolled shrinker sock down at top, so that patella was just covered.  He adjusted protector shorter and donned it.  Pt stated that it felt good.   At end of session, pt reclined in recliner with needs at hand and seat pad alarm set.     Therapy Documentation Precautions:  Precautions Precautions: Fall Precaution Comments: R BKA, wound vac Required Braces or Orthoses: Other Brace Other Brace: R BKA limb guard Restrictions Weight Bearing Restrictions: Yes RLE Weight Bearing: Non weight  bearing Other Position/Activity Restrictions: "strict NWB"       Therapy/Group: Individual Therapy  Jaisean Monteforte 09/08/2019, 4:33 PM

## 2019-09-08 NOTE — Progress Notes (Signed)
Physical Therapy Session Note  Patient Details  Name: Natalie Contreras MRN: [id number] Date of Birth: 10-Mar-1966  Today's Date: 09/08/2019 PT Individual Time: 1030-1125 and 1300-1400  PT Individual Time Calculation (min): 55 min and 60 min    Short Term Goals: Week 1:  PT Short Term Goal 1 (Week 1): Pt will perform car transfer with LRAD CGA PT Short Term Goal 2 (Week 1): Pt will initiate gait training with LRAD PT Short Term Goal 3 (Week 1): Pt will perform stand<>pivot transfer with LRAD CGA  Skilled Therapeutic Interventions/Progress Updates:   Treatment Session 1: 1030-1125 55 min Received pt sitting in recliner, pt agreeable to therapy, and denied any pain during today's session. Pt reported mild nausea from pain medication earlier but still motivated to participate in therapy. Session focused on functional mobility/transfers, LE strength, UE strength, standing balance, and improved tolerance to activity. Pt transferred recliner<>WC stand pivot without RW min A. Pt requested to use restroom. Pt transferred WC<>toilet without RW min A. Pt able to perform hygiene and clothing management (gown) with supervision from seated position. Pt washed hands at sink while seated in [facility] with supervision. Pt performed WC mobility 146ft with bilateral UE and supervision. Pt transferred stand<>pivot with RW min A to mat. Pt performed sit<>stand with RW 2x5 reps min A/CGA. Pt performed seated bicep curls 2x12 with 3lb weight, seated shoulder press 2x8 with 3lb weights, and seated tricep extension 2x8 with verbal cues for technique. Pt performed seated volleyball toss with 3lb dowel with emphasis on dynamic sitting balance and reaching. Pt performed standing RLE hip flexion and abduction x12 with bilateral UE support on RW CGA. Pt performed standing dynamic balance with UE support on RW, clipping clothespins to basketball net x2 trials CGA. Pt reported fatigue at end of session. Concluded session with pt sitting  in recliner, needs within reach, and chair pad alarm on.   Treatment Session 2: 1300-1400 60 min  Received pt sitting in recliner, pt agreeable to therapy, and did not report pain at start of session. Session focused on functional mobility/transfers, LE strength, balance, and improved endurance with activity. Pt transferred stand<>pivot recliner<>WC min A without RW. Pt's daughter Natalie Contreras present and discussed pt's CLOF with daughter. Pt transported to gym in [facility] for time management purposes. Pt performed furniture transfer from WC<>couch via lateral scoot min A. Pt attempted to transfer couch<>WC via lateral scoot but unable to complete transfer. Pt attempted transfer with slideboard but still unsuccessful. Pt with increased frustration not being able to complete transfer. Therapist encouraged pt to take rest breaks as needed. Pt stated "I'm not going to be able to do this, you're going to have to lift me." Pt attempted sit<>stand with RW from couch max A however unsuccessful due to low surface. Therapist attempted sit<>stand with stedy +2 assist however pt unsuccessful and LLE buckling due to weakness. Pt required +2 assist to lower buttocks onto couch however hips not completely on couch and sliding forward resulting in R residual limb resting on floor. Pt required +2 assist to scoot hips back on couch. Pt in distress afterwards and became emotional. Pt stated "I thought I was going down onto the floor!". Pt expressed fear more so than pain with activity and stated "I'm terrified to fall". Therapist provided emotional support and encouragement, pt able to be consoled after extensive rest break. Pt denied injury but reported increased pain in R residual limb. Pt transported back to gym in [facility]. Pt transferred lateral scoot WC<>recliner  min A. Pt with increased calmness stating " I'll just get [name] to come on over and bring me some pain meds". Concluded session with pt sitting in recliner, needs within reach, chair pad  alarm on.   Therapy Documentation Precautions:  Precautions Precautions: Fall Precaution Comments: R BKA, wound vac Required Braces or Orthoses: Other Brace Other Brace: R BKA limb guard Restrictions Weight Bearing Restrictions: Yes RLE Weight Bearing: Non weight bearing Other Position/Activity Restrictions: "strict NWB"   Therapy/Group: Individual Therapy Alfonse Alpers PT, DPT   09/08/2019, 7:38 AM

## 2019-09-08 NOTE — Progress Notes (Signed)
Social Work Patient ID: Natalie Contreras, female   DOB: 04-14-1966, 53 y.o.   MRN: [id number]  Met with pt to discuss team conference goals supervision-mod/i level and target discharge 12/31. She feels she is doing well and will be ready to go home then. She will be glad her wound vac will be off by then and she will not need to take home with her. Will work on discharge needs and follow up.

## 2019-09-08 NOTE — Patient Care Conference (Signed)
Inpatient RehabilitationTeam Conference and Plan of Care Update Date: 09/08/2019   Time: 11:45 AM   Patient Name: Natalie Contreras      Medical Record Number: [id number]  Date of Birth: September 16, 1966 Sex: Female         Room/Bed: 4W19C/4W19C-01 Payor Info: Payor: MEDICAID [place] / Plan: MEDICAID [place] ACCESS / Product Type: *No Product type* /    Admit Date/Time:  09/06/2019  1:51 PM  Primary Diagnosis:  S/P BKA (below knee amputation) [facility])  Patient Active Problem List   Diagnosis Date Noted  . Labile blood pressure   . Labile blood glucose   . Morbid obesity ([place])   . Acute blood loss anemia   . Hypoalbuminemia due to protein-calorie malnutrition ([place])   . Stage 3 chronic kidney disease   . Diabetic peripheral neuropathy ([place])   . S/P BKA (below knee amputation) ([place]) 09/06/2019  . Cutaneous abscess of right foot   . Dehiscence of amputation stump ([place])   . 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 Severe protein-calorie malnutrition ([place])   . Gangrene of right foot ([place])   . Abscess or cellulitis of foot 08/08/2019  . Obesity (BMI 30-39.9) 08/08/2019  . Wound infection 06/13/2019  . Cellulitis 07/17/2017  . Subacute osteomyelitis, right ankle and foot ([place])   . Toe amputation status, left 05/05/2017  . Gangrene of left foot ([place])   . PAD (peripheral artery disease) ([place]) 04/10/2017  . Essential hypertension 04/10/2017  . Chronic diastolic CHF (congestive heart failure) ([place]) 04/10/2017  . AKI (acute kidney injury) ([place]) 04/10/2017  . Foreign body in left foot   . Cellulitis and abscess of foot 04/07/2017  . Carotid stenosis, asymptomatic, bilateral 01/15/2017  . Visual disturbance 01/15/2017  . Tobacco abuse 01/15/2017  . Acute respiratory failure with hypoxia ([place]) 10/30/2016  . Pulmonary hypertension ([place]) 10/29/2016  . Acute diastolic CHF (congestive heart failure) ([place]) 10/28/2016  . Benign essential HTN 10/28/2016  . Hyperlipidemia 10/28/2016  . Diabetic infection of  right foot ([place]) 10/28/2016    Expected Discharge Date: Expected Discharge Date: 09/16/19  Team Members Present: Physician leading conference: Dr. Delice Lesch Social Worker Present: Ovidio Kin, LCSW Nurse Present: Rozetta Nunnery, RN Case Manager: Karene Fry, RN PT Present: Becky Sax, PT OT Present: Roanna Epley, COTA SLP Present: Jettie Booze, CF-SLP PPS Coordinator present : Gunnar Fusi, SLP     Current Status/Progress Goal Weekly Team Focus  Bowel/Bladder   Pt is continent of b/b. LBM 12/19. pt refusing suppository at this time.  Pt will remain continent of b/b  Q2h toileting/PRN   Swallow/Nutrition/ Hydration             ADL's   Min-mod assist transfers, Mod A LB dressing.  Pt fatigues quickly reqiuring increased time and assistance with mobility  Mod I bathing and dressing, Supervision transfers and standing  transfer training, ADL retraining, BUE strengthening, pt education, d/c planning   Mobility   bed mobility supervision, trasnfers min/mod A, WC mobility 65ft with supervision  Mod I transfers, supervision standing and ambulation  amputee education, transfers, LE strength, dynamic standing balance, improved endurance   Communication             Safety/Cognition/ Behavioral Observations            Pain   pt c/o of RLE pain 7/10. LLE phantom pain, PRN oxy and robaxin effective  Pt will be pain free  Assess pain Qshift/PRN   Skin   Pt as negative  pressure wound therapy on RLE. MASD to the groin  Pt will be free of infection and skin breakdown  Assess skin qshift/prn      *See Care Plan and progress notes for long and short-term goals.     Barriers to Discharge  Current Status/Progress Possible Resolutions Date Resolved   Nursing                  PT  Medical stability;Wound Care;Weight bearing restrictions  Wound vac              OT                  SLP                SW                Discharge Planning/Teaching Needs:  Home with daughter and when  daughter works has a friend who can provide assist. So will have 24 hr care if needed. Pt is hopeful she will be mod/i wheelchair level by discharge.      Team Discussion: CKD, monitoring labs, ABLA, monitoring CBG and BP enc to drink more fluids.  RN - pain med given, given sorbitol today, has MASD.  OT min/mod transfers, fatigues, mod A LB dressing, goals S transfers, mod A goals for self care.  PT min A stand pivot, goals mod I transfers, goals S transfers to car.   Revisions to Treatment Plan: N/A     Medical Summary Current Status: Impaired mobility and ADLs secondary to Right BKA with history of left foot 2 through 5 digit amputations Weekly Focus/Goal: Improve mobility, transfers, BP/DM, ABLA, CKD  Barriers to Discharge: Medical stability;Weight bearing restrictions;Weight;Wound care   Possible Resolutions to Barriers: Therapies, optimize DM/BP, follow labs, encourage fluids, d/c VAC   Continued Need for Acute Rehabilitation Level of Care: The patient requires daily medical management by a physician with specialized training in physical medicine and rehabilitation for the following reasons: Direction of a multidisciplinary physical rehabilitation program to maximize functional independence : Yes Medical management of patient stability for increased activity during participation in an intensive rehabilitation regime.: Yes Analysis of laboratory values and/or radiology reports with any subsequent need for medication adjustment and/or medical intervention. : Yes   I attest that I was present, lead the team conference, and concur with the assessment and plan of the team.   Retta Diones 09/09/2019, 8:31 AM  Team conference was held via web/ teleconference due to [place] - 19

## 2019-09-09 ENCOUNTER — Inpatient Hospital Stay (HOSPITAL_COMMUNITY): Payer: Medicaid Other

## 2019-09-09 ENCOUNTER — Inpatient Hospital Stay (HOSPITAL_COMMUNITY): Payer: Medicaid Other | Admitting: Physical Therapy

## 2019-09-09 DIAGNOSIS — G8918 Other acute postprocedural pain: Secondary | ICD-10-CM

## 2019-09-09 LAB — GLUCOSE, CAPILLARY
Glucose-Capillary: 112 mg/dL — ABNORMAL HIGH (ref 70–99)
Glucose-Capillary: 105 mg/dL — ABNORMAL HIGH (ref 70–99)
Glucose-Capillary: 113 mg/dL — ABNORMAL HIGH (ref 70–99)
Glucose-Capillary: 115 mg/dL — ABNORMAL HIGH (ref 70–99)

## 2019-09-09 NOTE — Progress Notes (Signed)
Occupational Therapy Session Note  Patient Details  Name: Natalie Contreras MRN: [id number] Date of Birth: 23-Mar-1966  Today's Date: 09/09/2019 OT Individual Time: 1000-1055 OT Individual Time Calculation (min): 55 min    Short Term Goals: Week 1:  OT Short Term Goal 1 (Week 1): STG = LTGs due to ELOS  Skilled Therapeutic Interventions/Progress Updates:    Pt resting in recliner upon arrival and agreeable to therapy.  Pt performed squat pivot transfer to w/c with CGA. Pt propelled w/c to nursing station and therapist propelled to gym for time management.  Demonstrated removing/replacing leg rest on w/c.  Pt attempted X 5 but unsuccessful  Pt propelled w/c to ADL kitchen for education and practice with w/c mobility in home environment.  Educated pt on kitchen and w/c safety.  Pt verbalized understanding. Pt requires min verbal cues for w/c mobility when turning corners.  Pt returned to room.  Pt unable to power up for squat pivot transfer to recliner.  Pt performed slide board transfer with CGA. Pt remained in recliner with all needs within reach and seat alarm activated.   Therapy Documentation Precautions:  Precautions Precautions: Fall Precaution Comments: R BKA, wound vac Required Braces or Orthoses: Other Brace Other Brace: R BKA limb guard Restrictions Weight Bearing Restrictions: Yes RLE Weight Bearing: Non weight bearing Other Position/Activity Restrictions: "strict NWB"  Pain:  Pt denies pain this morning.  She states that during the night the limb guard became very uncomfortable and she removed it.   Therapy/Group: Individual Therapy  Leroy Libman 09/09/2019, 10:59 AM

## 2019-09-09 NOTE — Progress Notes (Signed)
Pt is sitting up in the chair at this time watching tv. Pt states she does not normally sleep that much when she is home. Pt denies pain at this time.

## 2019-09-09 NOTE — Progress Notes (Signed)
Physical Therapy Session Note  Patient Details  Name: Natalie Contreras MRN: [id number] Date of Birth: November 13, 1965  Today's Date: 09/09/2019 PT Individual Time: 1705-1730 PT Individual Time Calculation (min): 25 min   Short Term Goals: Week 1:  PT Short Term Goal 1 (Week 1): Pt will perform car transfer with LRAD CGA PT Short Term Goal 2 (Week 1): Pt will initiate gait training with LRAD PT Short Term Goal 3 (Week 1): Pt will perform stand<>pivot transfer with LRAD CGA  Skilled Therapeutic Interventions/Progress Updates:   .Pt received sitting in recliner and agreeable to PT. PT treatment focused on LE and UE strengthening.  LE: LAQ, HS curls, hip abduction isometric hip adduction, hip flexion. Each completed x 12 BLE with level 2 tband on the LLE and AROM on the RLE.   UE: Chest press x 15, overhead x 15 press, mid row with level 2 tband x 15  and ball tap x 1 min. Each UE therex completed with 4# bar weight. Cues for posture, proper speed and increased ROM throughout for improved technique and maximized strengthening aspects of movements. Pt left sitting in recliner at end of treatment.        Therapy Documentation Precautions:  Precautions Precautions: Fall Precaution Comments: R BKA, wound vac Required Braces or Orthoses: Other Brace Other Brace: R BKA limb guard Restrictions Weight Bearing Restrictions: Yes RLE Weight Bearing: Non weight bearing Other Position/Activity Restrictions: "strict NWB" Vital Signs: Therapy Vitals Temp: 98.1 F (36.7 C) Pulse Rate: 73 Resp: 18 BP: (!) 150/36 Patient Position (if appropriate): Sitting Oxygen Therapy SpO2: 100 % O2 Device: Room Air Pain: denies   Therapy/Group: Individual Therapy  Lorie Phenix 09/09/2019, 5:33 PM

## 2019-09-09 NOTE — Progress Notes (Signed)
Pt c/o extreme irritation and pain b/w folds of skin in the groin area. Skin is red and inflamed. Pt used warm water and soap to wash the target areas. Pt requested a medicated barrier cream to help the area heal faster.

## 2019-09-09 NOTE — Progress Notes (Signed)
Physical Therapy Session Note  Patient Details  Name: Natalie Contreras MRN: [id number] Date of Birth: 1966/03/02  Today's Date: 09/09/2019 PT Individual Time: [id number] and 1300-1400 PT Individual Time Calculation (min): 45 min and 60 min  Short Term Goals: Week 1:  PT Short Term Goal 1 (Week 1): Pt will perform car transfer with LRAD CGA PT Short Term Goal 2 (Week 1): Pt will initiate gait training with LRAD PT Short Term Goal 3 (Week 1): Pt will perform stand<>pivot transfer with LRAD CGA  Skilled Therapeutic Interventions/Progress Updates:   Treatment Session 1: [id number] 45 min Received pt sitting in recliner, pt agreeable to therapy, and reported pain 5/10 in R residual limb. RN present administering medication. Session focused on functional mobility/trasnfers, toilet transfer, simulated car transfer, LE strength, standing balance, and improved tolerance to activity. Pt transferred stand<>pivot from recliner<>WC without AD min A. Pt requested to use bathroom and transferred WC<>toilet stand<>pivot without AD CGA. Pt able to perform hygiene and clothing management (gown) with CGA. PT donned limb guard with max A. Pt washed hands at sink seated in [facility] with supervision. Pt performed WC mobility 149ft with bilateral UE and supervision. Pt performed simulated car transfer from WC<>car squat<>pivot without AD mod A. Pt attempted squat<>pivot from car<>WC but requested to use the slideboard mod A and total assist to place board, stating she didn't think she could make it back. Pt performed stand<>pivot from WC<>recliner min A. Concluded session with pt sitting in recliner, needs within reach, and chair pad alarm on.   Treatment Session 2: 1300-1400 60 min Received pt sitting in recliner, pt agreeable to therapy, and reported she just received pain medication 20 mins prior to therapy. Session focused on functional mobility/trasnfers, dynamic standing balance, trunk and postural control, LE strength,  and improved endurance with activity. Pt transferred stand<>pivot from recliner<>WC CGA. Pt performed WC mobility 170ft with bilateral UEs and supervision. Pt transferred stand<>pivot WC<>mat without AD CGA. Pt performed 3x10 seated trunk rotations with 6lb medicine ball. Standing with UE support on RW, pt threw horseshoes x 4 trials with CGA. Each trial pt with increased fatigue and requiring increased seated rest breaks stating "my left leg feels like it's going to give out". Therapist encouraged rest breaks. Pt performed 2x8 seated tricep extensions with verbal cues for technique. Pt performed bilateral UE strengthening with body blade x30 seconds with elbow extended and elbow in 90 degrees of flexion. Pt with increased difficulty with activity but stated "I liked trying something new". Pt performed sit<>stands with RW 2x5 with CGA and increased time. Pt requested to sit in recliner at end of session. Daughter Natalie Contreras present. Pt transferred lateral scoot from WC<>recliner min A due to increased fatigue. Concluded session with pt sitting in recliner, needs within reach, and chair pad alarm on.   Therapy Documentation Precautions:  Precautions Precautions: Fall Precaution Comments: R BKA, wound vac Required Braces or Orthoses: Other Brace Other Brace: R BKA limb guard Restrictions Weight Bearing Restrictions: Yes RLE Weight Bearing: Non weight bearing Other Position/Activity Restrictions: "strict NWB"   Therapy/Group: Individual Therapy Alfonse Alpers PT, DPT   09/09/2019, 7:29 AM

## 2019-09-09 NOTE — Progress Notes (Addendum)
[place] PHYSICAL MEDICINE & REHABILITATION PROGRESS NOTE  Subjective/Complaints: Patient seen sitting up in her chair this morning.  She slept her baseline.  She states she is agreeable to discharge today.  ROS: Denies CP, shortness of breath, nausea, vomiting, diarrhea.  Objective: Vital Signs: Blood pressure (!) 146/52, pulse 69, temperature 98.3 F (36.8 C), temperature source Oral, resp. rate 16, last menstrual period 12/16/2015, SpO2 99 %. No results found. Recent Labs    09/07/19 0528  WBC 6.5  HGB 10.4*  HCT 33.6*  PLT 251   Recent Labs    09/07/19 0528  NA 139  K 3.7  CL 99  CO2 28  GLUCOSE 138*  BUN 47*  CREATININE 1.21*  CALCIUM 8.8*    Physical Exam: BP (!) 146/52 (BP Location: Left Arm)   Pulse 69   Temp 98.3 F (36.8 C) (Oral)   Resp 16   LMP 12/16/2015   SpO2 99%  Constitutional: No distress . Vital signs reviewed. HENT: Normocephalic.  Atraumatic. Eyes: EOMI. No discharge. Cardiovascular: No JVD. Respiratory: Normal effort.  No stridor. GI: Non-distended. Skin: +VAC. Psych: Normal mood.  Normal behavior. Musc: Right BKA with edema and tenderness Left foot amputations of digits 2-5 Neuro: Alert Motor: Bilateral upper extremities: 5/5 proximal distal Right lower extremity: Hip flexion, knee extension 4+/5 Left lower extremity: 4+/5 proximal distal, stable  Assessment/Plan: 1. 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  Physiatrist is providing close team supervision and 24 hour management of active medical problems listed below.  Physiatrist and rehab team continue to assess barriers to discharge/monitor patient progress toward functional and medical goals  Care Tool:  Bathing    Body parts bathed by patient: Right arm, Left arm, Chest, Abdomen, Front perineal area, Right upper leg,  Left upper leg, Face   Body parts bathed by helper: Buttocks, Left lower leg     Bathing assist Assist Level: Moderate Assistance - Patient 50 - 74%     Upper Body Dressing/Undressing Upper body dressing   What is the patient wearing?: Pull over shirt    Upper body assist Assist Level: Set up assist    Lower Body Dressing/Undressing Lower body dressing      What is the patient wearing?: Pants     Lower body assist Assist for lower body dressing: Moderate Assistance - Patient 50 - 74%     Toileting Toileting    Toileting assist Assist for toileting: Moderate Assistance - Patient 50 - 74%     Transfers Chair/bed transfer  Transfers assist     Chair/bed transfer assist level: Minimal Assistance - Patient > 75%     Locomotion Ambulation   Ambulation assist   Ambulation activity did not occur: Refused(Pt refused due to fear of falling, and LLE weakness)          Walk 10 feet activity   Assist  Walk 10 feet activity did not occur: Refused(Pt refused due to fear of falling, and LLE weakness)        Walk 50 feet activity   Assist Walk 50 feet with 2 turns activity did not occur: Refused(Pt refused due to fear of falling, and LLE weakness)         Walk 150 feet activity   Assist Walk 150 feet activity did not occur: Refused(Pt refused due to fear of falling, and LLE weakness)  Walk 10 feet on uneven surface  activity   Assist Walk 10 feet on uneven surfaces activity did not occur: Refused(Pt refused due to fear of falling, and LLE weakness)         Wheelchair     Assist Will patient use wheelchair at discharge?: Yes Type of Wheelchair: Manual    Wheelchair assist level: Supervision/Verbal cueing Max wheelchair distance: 154ft    Wheelchair 50 feet with 2 turns activity    Assist        Assist Level: Supervision/Verbal cueing   Wheelchair 150 feet activity     Assist Wheelchair 150 feet activity did not  occur: Safety/medical concerns(fatigue, increased discomfort in bilateral UE's)   Assist Level: Supervision/Verbal cueing      Medical Problem List and Plan: 1.  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  Continue CIR 2.  Antithrombotics: -DVT/anticoagulation:  Pharmaceutical: Lovenox             -antiplatelet therapy: ASA 3. Pain Management: Oxycodone prn.   Controlled with meds on 12/24 4. Mood: LCSW to follow for evaluation and support.              -antipsychotic agents: N/A 5. Neuropsych: This patient is capable of making decisions on her own behalf. 6. Skin/Wound Care: Routine pressure relief measures.  Added protein supplement to promote wound healing.  7. Fluids/Electrolytes/Nutrition: Monitor I/O.  8. HTN: Continue to monitor BP   Cont coreg bid   Hold Demadex daily due to BUN  Relatively controlled on 12/24  Monitor with increased mobility 9. T2DM with neuropathy/hyperglycemia: Monitor BS ac/hs. On Levemir at bedtime and  Metformin/canaflifozin in am.   Labile on 12/24, monitor for trend  Monitor with increased mobility 10.  CKD: Baseline SCr around 1.3 per record. Continue to encourage fluid intake.   Creatinine 1.21 on 12/22, however BUN trending up, labs ordered for tomorrow  Holding diuretic  Continue to monitor 11.  Hypoalbuminemia  Supplement initiated 12.  Acute blood loss anemia  Hemoglobin 10.4 on 12/22, labs ordered for tomorrow  Continue to monitor 13.  Morbid obesity  Encourage weight loss  LOS: 3 days A FACE TO FACE EVALUATION WAS PERFORMED  Natalie Contreras Natalie Contreras 09/09/2019, 9:56 AM

## 2019-09-10 DIAGNOSIS — Z720 Tobacco use: Secondary | ICD-10-CM

## 2019-09-10 LAB — GLUCOSE, CAPILLARY
Glucose-Capillary: 125 mg/dL — ABNORMAL HIGH (ref 70–99)
Glucose-Capillary: 127 mg/dL — ABNORMAL HIGH (ref 70–99)
Glucose-Capillary: 126 mg/dL — ABNORMAL HIGH (ref 70–99)
Glucose-Capillary: 164 mg/dL — ABNORMAL HIGH (ref 70–99)

## 2019-09-10 LAB — BASIC METABOLIC PANEL
Anion gap: 8 (ref 5–15)
BUN: 71 mg/dL — ABNORMAL HIGH (ref 6–20)
CO2: 30 mmol/L (ref 22–32)
Calcium: 8.9 mg/dL (ref 8.9–10.3)
Chloride: 100 mmol/L (ref 98–111)
Creatinine, Ser: 1.41 mg/dL — ABNORMAL HIGH (ref 0.44–1.00)
GFR calc Af Amer: 50 mL/min — ABNORMAL LOW (ref >60)
GFR calc non Af Amer: 43 mL/min — ABNORMAL LOW (ref >60)
Glucose, Bld: 131 mg/dL — ABNORMAL HIGH (ref 70–99)
Potassium: 4 mmol/L (ref 3.5–5.1)
Sodium: 138 mmol/L (ref 135–145)

## 2019-09-10 LAB — CBC WITH DIFFERENTIAL/PLATELET
Abs Immature Granulocytes: 0.02 10*3/uL (ref 0.00–0.07)
Basophils Absolute: 0.1 10*3/uL (ref 0.0–0.1)
Basophils Relative: 1 %
Eosinophils Absolute: 0.6 10*3/uL — ABNORMAL HIGH (ref 0.0–0.5)
Eosinophils Relative: 10 %
HCT: 33.8 % — ABNORMAL LOW (ref 36.0–46.0)
Hemoglobin: 10.4 g/dL — ABNORMAL LOW (ref 12.0–15.0)
Immature Granulocytes: 0 %
Lymphocytes Relative: 32 %
Lymphs Abs: 2 10*3/uL (ref 0.7–4.0)
MCH: 26.7 pg (ref 26.0–34.0)
MCHC: 30.8 g/dL (ref 30.0–36.0)
MCV: 86.7 fL (ref 80.0–100.0)
Monocytes Absolute: 0.4 10*3/uL (ref 0.1–1.0)
Monocytes Relative: 7 %
Neutro Abs: 3.1 10*3/uL (ref 1.7–7.7)
Neutrophils Relative %: 50 %
Platelets: 275 10*3/uL (ref 150–400)
RBC: 3.9 MIL/uL (ref 3.87–5.11)
RDW: 17 % — ABNORMAL HIGH (ref 11.5–15.5)
WBC: 6.2 10*3/uL (ref 4.0–10.5)
nRBC: 0 % (ref 0.0–0.2)

## 2019-09-10 MED ORDER — OXYCODONE HCL 5 MG PO TABS
5.0000 mg | ORAL_TABLET | Freq: Four times a day (QID) | ORAL | Status: DC | PRN
  Administered 2019-09-10 – 2019-09-16 (×11): 10 mg via ORAL
  Filled 2019-09-10 (×14): qty 2

## 2019-09-10 NOTE — Progress Notes (Signed)
Wound vac removed per order. Patient was premedicated and tolerated well. Two small areas bleeding at removal but stopped quickly. Non adherent dressing, gauze and ace wrap applied. One small blister just under the dressing margin popped during removal, serous drainage, cleansed.

## 2019-09-10 NOTE — Progress Notes (Signed)
[place] PHYSICAL MEDICINE & REHABILITATION PROGRESS NOTE  Subjective/Complaints: Patient seen sitting up in her chair this morning.  She slept fairly overnight.  She states she is looking forward to having her wound VAC DC'd.  ROS: Denies CP, shortness of breath, nausea, vomiting, diarrhea.  Objective: Vital Signs: Blood pressure (!) 147/48, pulse 66, temperature (!) 97.4 F (36.3 C), resp. rate 16, last menstrual period 12/16/2015, SpO2 98 %. No results found. Recent Labs    09/10/19 0507  WBC 6.2  HGB 10.4*  HCT 33.8*  PLT 275   Recent Labs    09/10/19 0507  NA 138  K 4.0  CL 100  CO2 30  GLUCOSE 131*  BUN 71*  CREATININE 1.41*  CALCIUM 8.9    Physical Exam: BP (!) 147/48   Pulse 66   Temp (!) 97.4 F (36.3 C)   Resp 16   LMP 12/16/2015   SpO2 98%   Constitutional: No distress . Vital signs reviewed. HENT: Normocephalic.  Atraumatic. Eyes: EOMI. No discharge. Cardiovascular: No JVD. Respiratory: Normal effort.  No stridor. GI: Non-distended. Skin: + VAC. Psych: Normal mood.  Normal behavior. Musc: No edema in extremities.  No tenderness in extremities. Musc: Right BKA with edema and tenderness, improving Left foot amputations of digits 2-5 Neuro: Alert Motor: Bilateral upper extremities: 5/5 proximal distal Right lower extremity: Hip flexion, knee extension 4+/5 Left lower extremity: 4+/5 proximal distal, unchanged  Assessment/Plan: 1. 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  Physiatrist is providing close team supervision and 24 hour management of active medical problems listed below.  Physiatrist and rehab team continue to assess barriers to discharge/monitor patient progress toward functional and medical goals  Care Tool:  Bathing    Body parts bathed by patient: Right arm, Left arm,  Chest, Abdomen, Front perineal area, Right upper leg, Left upper leg, Face   Body parts bathed by helper: Buttocks, Left lower leg     Bathing assist Assist Level: Moderate Assistance - Patient 50 - 74%     Upper Body Dressing/Undressing Upper body dressing   What is the patient wearing?: Pull over shirt    Upper body assist Assist Level: Set up assist    Lower Body Dressing/Undressing Lower body dressing      What is the patient wearing?: Pants     Lower body assist Assist for lower body dressing: Moderate Assistance - Patient 50 - 74%     Toileting Toileting    Toileting assist Assist for toileting: Contact Guard/Touching assist     Transfers Chair/bed transfer  Transfers assist     Chair/bed transfer assist level: Minimal Assistance - Patient > 75%     Locomotion Ambulation   Ambulation assist   Ambulation activity did not occur: Refused(Pt refused due to fear of falling, and LLE weakness)          Walk 10 feet activity   Assist  Walk 10 feet activity did not occur: Refused(Pt refused due to fear of falling, and LLE weakness)        Walk 50 feet activity   Assist Walk 50 feet with 2 turns activity did not occur: Refused(Pt refused due to fear of falling, and LLE weakness)         Walk 150 feet activity   Assist Walk 150 feet activity did not occur: Refused(Pt refused due to  fear of falling, and LLE weakness)         Walk 10 feet on uneven surface  activity   Assist Walk 10 feet on uneven surfaces activity did not occur: Refused(Pt refused due to fear of falling, and LLE weakness)         Wheelchair     Assist Will patient use wheelchair at discharge?: Yes Type of Wheelchair: Manual    Wheelchair assist level: Supervision/Verbal cueing Max wheelchair distance: 19ft    Wheelchair 50 feet with 2 turns activity    Assist        Assist Level: Supervision/Verbal cueing   Wheelchair 150 feet activity      Assist Wheelchair 150 feet activity did not occur: Safety/medical concerns(fatigue, increased discomfort in bilateral UE's)   Assist Level: Supervision/Verbal cueing      Medical Problem List and Plan: 1.  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  Continue CIR 2.  Antithrombotics: -DVT/anticoagulation:  Pharmaceutical: Lovenox             -antiplatelet therapy: ASA 3. Pain Management: Oxycodone prn, frequency decreased on 12/25.   Controlled with meds on 12/25 4. Mood: LCSW to follow for evaluation and support.              -antipsychotic agents: N/A 5. Neuropsych: This patient is capable of making decisions on her own behalf. 6. Skin/Wound Care: Routine pressure relief measures.  Added protein supplement to promote wound healing.  7. Fluids/Electrolytes/Nutrition: Monitor I/O.  8. HTN: Continue to monitor BP   Cont coreg bid   Hold Demadex daily due to BUN  Labile on 12/25, monitor for trend  Monitor with increased mobility 9. T2DM with neuropathy/hyperglycemia: Monitor BS ac/hs. On Levemir at bedtime and  Metformin/canaflifozin in am.   Slightly labile on 12/25, will consider further medication adjustments if necessary  Monitor with increased mobility 10.  CKD: Baseline SCr around 1.3 per record. Continue to encourage fluid intake.   Creatinine 1.41 on 12/25, labs ordered for Monday  Encourage fluids  Holding diuretic  Continue to monitor 11.  Hypoalbuminemia  Supplement initiated 12.  Acute blood loss anemia  Hemoglobin 10.4 on 12/25  Continue to monitor 13.  Morbid obesity  Encourage weight loss 14.  Tobacco abuse  Discussed inability to use e-cigarettes while in hospital with nursing.  LOS: 4 days A FACE TO FACE EVALUATION WAS PERFORMED   Lorie Phenix 09/10/2019, 2:02 PM

## 2019-09-10 NOTE — Progress Notes (Signed)
This nurse offered the pt Miralax. Pt refused the medication. Pt was educated on the importance of taking it and the benefits from it. Pt continued to refuse. Pt states her normal bowel patter is 1-2x a week.

## 2019-09-11 ENCOUNTER — Inpatient Hospital Stay (HOSPITAL_COMMUNITY): Payer: Medicaid Other

## 2019-09-11 ENCOUNTER — Inpatient Hospital Stay (HOSPITAL_COMMUNITY): Payer: Medicaid Other | Admitting: Occupational Therapy

## 2019-09-11 LAB — GLUCOSE, CAPILLARY
Glucose-Capillary: 159 mg/dL — ABNORMAL HIGH (ref 70–99)
Glucose-Capillary: 127 mg/dL — ABNORMAL HIGH (ref 70–99)
Glucose-Capillary: 189 mg/dL — ABNORMAL HIGH (ref 70–99)

## 2019-09-11 MED ORDER — SENNOSIDES-DOCUSATE SODIUM 8.6-50 MG PO TABS
1.0000 | ORAL_TABLET | Freq: Every day | ORAL | Status: DC
  Administered 2019-09-11 – 2019-09-13 (×3): 1 via ORAL
  Filled 2019-09-11 (×4): qty 1

## 2019-09-11 NOTE — Progress Notes (Signed)
Occupational Therapy Session Note  Patient Details  Name: Natalie Contreras MRN: [id number] Date of Birth: 11-03-1965  Today's Date: 09/11/2019 OT Individual Time: 1100-1200 OT Individual Time Calculation (min): 60 min    Short Term Goals: Week 1:  OT Short Term Goal 1 (Week 1): STG = LTGs due to ELOS  Skilled Therapeutic Interventions/Progress Updates:    Treatment session with focus on functional transfers, lateral scoots, and BUE strengthening.  Pt received seated EOB reporting need to toilet.  Pt completed sit > stand with RW with Min assist, attempted to transfer with pivot hops but unable to advance foot at all.  Ultimately completed lateral scoot bed > drop arm BSC with Min assist.  Pt completed sit > stand with RW to have therapist complete clothing management.  Pt able to complete hygiene from sitting position.  Squat pivot BSC > bed Mod assist due to decreased lift off and weight shift. Min assist squat pivot back to Rt to w/c.  Engaged in lateral leans and lateral scoots along edge of mat with focus on weight shift and lift off of buttocks to decrease sheering when transferring.  Engaged in ball toss with pt "batting" ball with 3# dowel for BUE strengthening and to challenge weight shifting and sitting balance.  Educated on care for residual limb as pt with blisters above knee.  Pt engaged in hip extension in sidelying and hamstring stretch in supine.  Returned to w/c Min assist lateral scoot and back to bed lateral scoot min assist.  Therapy Documentation Precautions:  Precautions Precautions: Fall Precaution Comments: R BKA, wound vac Required Braces or Orthoses: Other Brace Other Brace: R BKA limb guard Restrictions Weight Bearing Restrictions: Yes RLE Weight Bearing: Non weight bearing Other Position/Activity Restrictions: "strict NWB" Pain:  Pt with no c/o pain  Therapy/Group: Individual Therapy  Simonne Come 09/11/2019, 2:55 PM

## 2019-09-11 NOTE — Progress Notes (Signed)
Patient has not had bowel movement since 09/04/2019, she is refusing suppository. PRN miralax given. No s/s of distress noted at this time. Continue plan of care. Audie Clear, LPN

## 2019-09-11 NOTE — Progress Notes (Signed)
Physical Therapy Session Note  Patient Details  Name: Natalie Contreras MRN: [id number] Date of Birth: 1965/11/04  Today's Date: 09/11/2019 PT Individual Time: 0800-0900 and 1400-1459 PT Individual Time Calculation (min): 60 min and 59 min  Short Term Goals: Week 1:  PT Short Term Goal 1 (Week 1): Pt will perform car transfer with LRAD CGA PT Short Term Goal 2 (Week 1): Pt will initiate gait training with LRAD PT Short Term Goal 3 (Week 1): Pt will perform stand<>pivot transfer with LRAD CGA  Skilled Therapeutic Interventions/Progress Updates:   Treatment Session 1: 0800-0900 60 min Received pt sitting EOB with RN present, pt agreeable to therapy, and stated pain 7/10 in R residual limb. Pt reported she received pain medication prior to session. Session focused on functional mobility/transfers, dressing, LE and UE strengthening, standing balance, and improved tolerance to activity. Pt reported urge to use restroom and transferred bed<>WC stand<>pivot without AD CGA. Pt performed toilet transfer WC<>toilet stand<>pivot min A using grab bars. Pt able to perform hygiene management with supervision. Pt donned pull over shirt with supervision and donned pants with min A in sitting and mod A to pull over hips when standing. Pt washed hands and performed oral hygiene at sink with supervision. Pt performed WC mobility 140ft with bilateral UE's and supervision. Pt attempted gait/"hopping" inside parallel bars however pt reported increased fear of falling and declined activity. Discussion had regarding not ambulating into bathroom at home and set up for bedside commode next to recliner; pt verbalized agreement. Pt transferred stand<>pivot without AD CGA to L. Pt with increased difficulty performing stand<>pivot to R side due to fear and balance. Pt performed seated bicep curls 2x12 with 3lb weight, seated overhead shoulder press 2x10 with 3lb weight, seated tricep extension with yoga blocks 3x10, push and pull  against manual resistance from therapist with 4lb dowel x12, seated rows with orange TB x12, and horizontal shoulder abuction with orange TB 2x12. Pt requested to return to bed at end of session and transferred lateral scoot WC<>bed due to increased fatigue and inability to perform stand<>pivot. Pt performed bed mobility with supervision. Concluded session with pt supine in bed, needs within reach, and bed alarm on.   Treatment Session 2: [id number] 59 min  Received pt sitting EOB, pt agreeable to therapy, and did not report pain level during session. Session focused on functional mobility/transfers, LE strength, simulated car transfer, dynamic standing balance, and improved endurance with activity. Pt transferred stand<>pivot bed<>WC without AD CGA. Pt performed toilet transfer WC<>toilet min A due to WC brakes sliding when locked. Pt able to perform hygiene management with supervision but required min A for clothing management. Pt washed hands at sink seated in [facility] with supervision. Pt was educated on WC parts management and practiced x 3 trials throughout session, however pt continues to have difficulty donning R LE elevating leg rest. Pt performed WC mobility 116ft with bilateral UE's and supervision. Pt performed car transfer via squat<>pivot with min A. Pt performed stand<>pivot with RW CGA x 2 trials throughout session. Standing with UE support on RW, pt performed standing connect 4 x 3 trials CGA. Pt with increased fatigue with activity and required multiple seated rest breaks throughout session. Concluded session with pt sitting EOB, needs within reach, and bed alarm on. Therapist provided pt with drink.   Therapy Documentation Precautions:  Precautions Precautions: Fall Precaution Comments: R BKA, wound vac Required Braces or Orthoses: Other Brace Other Brace: R BKA limb guard Restrictions Weight Bearing  Restrictions: Yes RLE Weight Bearing: Non weight bearing Other Position/Activity  Restrictions: "strict NWB"   Therapy/Group: Individual Therapy Alfonse Alpers PT, DPT   09/11/2019, 7:31 AM

## 2019-09-11 NOTE — Progress Notes (Signed)
[place] PHYSICAL MEDICINE & REHABILITATION PROGRESS NOTE  Subjective/Complaints: Patient seen sitting in wheelchair in therapy gym today.  She slept fairly overnight.  She had a lot of pain with wound vac removal yesterday, but pain is well controlled this morning. Is having constipation. Urine is cloudy.   ROS: Denies CP, shortness of breath, nausea, vomiting, diarrhea.  Objective: Vital Signs: Blood pressure (!) 135/49, pulse 73, temperature 98.4 F (36.9 C), resp. rate 17, last menstrual period 12/16/2015, SpO2 93 %. No results found. Recent Labs    09/10/19 0507  WBC 6.2  HGB 10.4*  HCT 33.8*  PLT 275   Recent Labs    09/10/19 0507  NA 138  K 4.0  CL 100  CO2 30  GLUCOSE 131*  BUN 71*  CREATININE 1.41*  CALCIUM 8.9    Physical Exam: BP (!) 135/49 (BP Location: Left Arm)   Pulse 73   Temp 98.4 F (36.9 C)   Resp 17   LMP 12/16/2015   SpO2 93%   Constitutional: No distress . Vital signs reviewed. HENT: Normocephalic.  Atraumatic. Eyes: EOMI. No discharge. Cardiovascular: No JVD. Respiratory: Normal effort.  No stridor. GI: Non-distended. Skin: + VAC. Psych: Normal mood.  Normal behavior. Musc: No edema in extremities.  No tenderness in extremities. Musc: Right BKA with edema and tenderness, improving Left foot amputations of digits 2-5 Neuro: Alert Motor: Bilateral upper extremities: 5/5 proximal distal Right lower extremity: Hip flexion, knee extension 4+/5 Left lower extremity: 4+/5 proximal distal, unchanged  Assessment/Plan: 1. 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  Physiatrist is providing close team supervision and 24 hour management of active medical problems listed below.  Physiatrist and rehab team continue to assess barriers to discharge/monitor patient progress toward functional and  medical goals  Care Tool:  Bathing    Body parts bathed by patient: Right arm, Left arm, Chest, Abdomen, Front perineal area, Right upper leg, Left upper leg, Face   Body parts bathed by helper: Buttocks, Left lower leg     Bathing assist Assist Level: Moderate Assistance - Patient 50 - 74%     Upper Body Dressing/Undressing Upper body dressing   What is the patient wearing?: Pull over shirt    Upper body assist Assist Level: Supervision/Verbal cueing    Lower Body Dressing/Undressing Lower body dressing      What is the patient wearing?: Pants     Lower body assist Assist for lower body dressing: Moderate Assistance - Patient 50 - 74%     Toileting Toileting    Toileting assist Assist for toileting: Supervision/Verbal cueing     Transfers Chair/bed transfer  Transfers assist     Chair/bed transfer assist level: Minimal Assistance - Patient > 75%     Locomotion Ambulation   Ambulation assist   Ambulation activity did not occur: Refused(Pt refused due to fear of falling, and LLE weakness)          Walk 10 feet activity   Assist  Walk 10 feet activity did not occur: Refused(Pt refused due to fear of falling, and LLE weakness)        Walk 50 feet activity   Assist Walk 50 feet with 2 turns activity did not occur: Refused(Pt refused due to fear of falling, and LLE weakness)         Walk 150 feet activity  Assist Walk 150 feet activity did not occur: Refused(Pt refused due to fear of falling, and LLE weakness)         Walk 10 feet on uneven surface  activity   Assist Walk 10 feet on uneven surfaces activity did not occur: Refused(Pt refused due to fear of falling, and LLE weakness)         Wheelchair     Assist Will patient use wheelchair at discharge?: Yes Type of Wheelchair: Manual    Wheelchair assist level: Supervision/Verbal cueing Max wheelchair distance: 138ft    Wheelchair 50 feet with 2 turns  activity    Assist        Assist Level: Supervision/Verbal cueing   Wheelchair 150 feet activity     Assist Wheelchair 150 feet activity did not occur: Safety/medical concerns(fatigue, increased discomfort in bilateral UE's)   Assist Level: Supervision/Verbal cueing      Medical Problem List and Plan: 1.  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  Continue CIR 2.  Antithrombotics: -DVT/anticoagulation:  Pharmaceutical: Lovenox             -antiplatelet therapy: ASA 3. Pain Management: Oxycodone prn, frequency decreased on 12/25.   Controlled with meds on 12/25, 12/26 4. Mood: LCSW to follow for evaluation and support.              -antipsychotic agents: N/A 5. Neuropsych: This patient is capable of making decisions on her own behalf. 6. Skin/Wound Care: Routine pressure relief measures.  Added protein supplement to promote wound healing.  7. Fluids/Electrolytes/Nutrition: Monitor I/O.  8. HTN: Continue to monitor BP   Cont coreg bid   Hold Demadex daily due to BUN  Labile on 12/25, monitor for trend  Controlled on 12/26  Monitor with increased mobility 9. T2DM with neuropathy/hyperglycemia: Monitor BS ac/hs. On Levemir at bedtime and  Metformin/canaflifozin in am.   Slightly labile on 12/25, will consider further medication adjustments if necessary  Monitor with increased mobility 10.  CKD: Baseline SCr around 1.3 per record. Continue to encourage fluid intake.   Creatinine 1.41 on 12/25, labs ordered for Monday  Encourage fluids  Holding diuretic  Continue to monitor 11.  Hypoalbuminemia  Supplement initiated 12.  Acute blood loss anemia  Hemoglobin 10.4 on 12/25  Continue to monitor 13.  Morbid obesity  Encourage weight loss 14.  Tobacco abuse  Discussed inability to use e-cigarettes while in hospital with nursing. 15. Cloudy urine: UA/UC ordered 12/26. 16. Constipation: 12/26:  Senna-docusate HS started.   LOS: 5 days A FACE TO FACE EVALUATION WAS PERFORMED  Dyana Magner P Slyvia Lartigue 09/11/2019, 12:12 PM

## 2019-09-12 ENCOUNTER — Inpatient Hospital Stay (HOSPITAL_COMMUNITY): Payer: Medicaid Other

## 2019-09-12 ENCOUNTER — Inpatient Hospital Stay (HOSPITAL_COMMUNITY): Payer: Medicaid Other | Admitting: Occupational Therapy

## 2019-09-12 LAB — URINALYSIS, ROUTINE W REFLEX MICROSCOPIC
Bilirubin Urine: NEGATIVE
Glucose, UA: >=500 mg/dL — AB
Ketones, ur: NEGATIVE mg/dL
Nitrite: NEGATIVE
Protein, ur: 100 mg/dL — AB
Specific Gravity, Urine: 1.012 (ref 1.005–1.030)
pH: 6 (ref 5.0–8.0)

## 2019-09-12 LAB — GLUCOSE, CAPILLARY
Glucose-Capillary: 127 mg/dL — ABNORMAL HIGH (ref 70–99)
Glucose-Capillary: 149 mg/dL — ABNORMAL HIGH (ref 70–99)
Glucose-Capillary: 149 mg/dL — ABNORMAL HIGH (ref 70–99)
Glucose-Capillary: 197 mg/dL — ABNORMAL HIGH (ref 70–99)

## 2019-09-12 NOTE — Progress Notes (Signed)
Occupational Therapy Session Note  Patient Details  Name: Natalie Contreras MRN: [id number] Date of Birth: Dec 21, 1965  Today's Date: 09/12/2019 OT Individual Time: [id number] and 730-738 OT Individual Time Calculation (min): 60 min and 8 minutes=68 minutes (missed52 minutes due to pain from falling last night)   Short Term Goals: Week 1:  OT Short Term Goal 1 (Week 1): STG = LTGs due to ELOS  Skilled Therapeutic Interventions/Progress Updates:   7:30-7:38.   8 minutes.    Patient attempted to participate in bed mobility and endurance but that pain in right residual limb hurt to badly to continue (she stated it hurt from falling last night during 3:1 BSC transfer with staff).  [phone] 60 minutes.   Patient participated in endurance activities and dynamic sitting balance activities in prep for safe ADLs when she goes home.   She was able to reach periarea with lateral leans on bed.     3:1 transfer to/fr edge of bed = close S with left foot scoots between bed and 3:1  Sit to stand=close S (she did not allow therapist to help or touch her for this activity or transfers) Toilet transfer (bed to/fr 3:1)=close S for left foot 3-4 side scoots on floor and via walker  She was shown bed rail, which she states she may buy for d/c home to assist with bed mobility and lateral leans.   Stated she will bath and dress on her bed  At end of the session, she was left seated on side of bed with alarm in place     Therapy Documentation Precautions:  Precautions Precautions: Fall Precaution Comments: R BKA, wound vac Required Braces or Orthoses: Other Brace Other Brace: R BKA limb guard Restrictions Weight Bearing Restrictions: Yes RLE Weight Bearing: Non weight bearing Other Position/Activity Restrictions: "strict NWB"  Pain:10/10.   RN gave pain meds (It hurts nothing is touching it after falling last night)   Therapy/Group: Individual Therapy  Alfredia Ferguson [facility] 09/12/2019, 3:43 PM

## 2019-09-12 NOTE — Progress Notes (Signed)
[place] PHYSICAL MEDICINE & REHABILITATION PROGRESS NOTE  Subjective/Complaints: Yesterday BKA hit bed. Has some increase in pain today but incision intact. Has not been taking Oxycodone frequently, pain is usually well controlled at rest and increases with therapy.   ROS: Denies CP, shortness of breath, nausea, vomiting, diarrhea.  Objective: Vital Signs: Blood pressure (!) 135/51, pulse 71, temperature 98.2 F (36.8 C), resp. rate 18, last menstrual period 12/16/2015, SpO2 95 %. No results found. Recent Labs    09/10/19 0507  WBC 6.2  HGB 10.4*  HCT 33.8*  PLT 275   Recent Labs    09/10/19 0507  NA 138  K 4.0  CL 100  CO2 30  GLUCOSE 131*  BUN 71*  CREATININE 1.41*  CALCIUM 8.9    Physical Exam: BP (!) 135/51 (BP Location: Right Arm)   Pulse 71   Temp 98.2 F (36.8 C)   Resp 18   LMP 12/16/2015   SpO2 95%   Constitutional: No distress . Vital signs reviewed. HENT: Normocephalic.  Atraumatic. Eyes: EOMI. No discharge. Cardiovascular: No JVD. Respiratory: Normal effort.  No stridor. GI: Non-distended. Skin: + VAC. Psych: Normal mood.  Normal behavior. Musc: No edema in extremities.  No tenderness in extremities. Musc: Right BKA with edema and tenderness, improving Left foot amputations of digits 2-5 Neuro: Alert Motor: Bilateral upper extremities: 5/5 proximal distal Right lower extremity: Hip flexion, knee extension 4+/5 Left lower extremity: 4+/5 proximal distal, unchanged  Assessment/Plan: 1. 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  Physiatrist is providing close team supervision and 24 hour management of active medical problems listed below.  Physiatrist and rehab team continue to assess barriers to discharge/monitor patient progress toward functional and medical goals  Care Tool:  Bathing     Body parts bathed by patient: Right arm, Left arm, Chest, Abdomen, Front perineal area, Right upper leg, Left upper leg, Face   Body parts bathed by helper: Buttocks, Left lower leg     Bathing assist Assist Level: Moderate Assistance - Patient 50 - 74%     Upper Body Dressing/Undressing Upper body dressing   What is the patient wearing?: Pull over shirt    Upper body assist Assist Level: Supervision/Verbal cueing    Lower Body Dressing/Undressing Lower body dressing      What is the patient wearing?: Pants     Lower body assist Assist for lower body dressing: Moderate Assistance - Patient 50 - 74%     Toileting Toileting    Toileting assist Assist for toileting: Supervision/Verbal cueing     Transfers Chair/bed transfer  Transfers assist     Chair/bed transfer assist level: Minimal Assistance - Patient > 75%     Locomotion Ambulation   Ambulation assist   Ambulation activity did not occur: Refused(Pt refused due to fear of falling, and LLE weakness)          Walk 10 feet activity   Assist  Walk 10 feet activity did not occur: Refused(Pt refused due to fear of falling, and LLE weakness)        Walk 50 feet activity   Assist Walk 50 feet with 2 turns activity did not occur: Refused(Pt refused due to fear of falling, and LLE weakness)         Walk 150 feet activity   Assist Walk 150 feet activity did not occur:  Refused(Pt refused due to fear of falling, and LLE weakness)         Walk 10 feet on uneven surface  activity   Assist Walk 10 feet on uneven surfaces activity did not occur: Refused(Pt refused due to fear of falling, and LLE weakness)         Wheelchair     Assist Will patient use wheelchair at discharge?: Yes Type of Wheelchair: Manual    Wheelchair assist level: Supervision/Verbal cueing Max wheelchair distance: 167ft    Wheelchair 50 feet with 2 turns activity    Assist        Assist Level:  Supervision/Verbal cueing   Wheelchair 150 feet activity     Assist Wheelchair 150 feet activity did not occur: Safety/medical concerns(fatigue, increased discomfort in bilateral UE's)   Assist Level: Supervision/Verbal cueing      Medical Problem List and Plan: 1.  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  Continue CIR 2.  Antithrombotics: -DVT/anticoagulation:  Pharmaceutical: Lovenox             -antiplatelet therapy: ASA 3. Pain Management: Oxycodone prn, frequency decreased on 12/25.   Controlled with meds on 12/25, 12/26  12/27: Please administer Oxy 30 minutes prior to therapy-- will place order. 4. Mood: LCSW to follow for evaluation and support.              -antipsychotic agents: N/A 5. Neuropsych: This patient is capable of making decisions on her own behalf. 6. Skin/Wound Care: Routine pressure relief measures.  Added protein supplement to promote wound healing.  7. Fluids/Electrolytes/Nutrition: Monitor I/O.  8. HTN: Continue to monitor BP   Cont coreg bid   Hold Demadex daily due to BUN  Labile on 12/25, monitor for trend  Controlled on 12/26  Monitor with increased mobility 9. T2DM with neuropathy/hyperglycemia: Monitor BS ac/hs. On Levemir at bedtime and  Metformin/canaflifozin in am.   Slightly labile on 12/25, will consider further medication adjustments if necessary  Monitor with increased mobility 10.  CKD: Baseline SCr around 1.3 per record. Continue to encourage fluid intake.   Creatinine 1.41 on 12/25, labs ordered for Monday  Encourage fluids  Holding diuretic  Continue to monitor 11.  Hypoalbuminemia  Supplement initiated 12.  Acute blood loss anemia  Hemoglobin 10.4 on 12/25  Continue to monitor 13.  Morbid obesity  Encourage weight loss 14.  Tobacco abuse  Discussed inability to use e-cigarettes while in hospital with nursing. 15. Cloudy urine: UA/UC ordered 12/26.  12/27:  UA negative except for rare bacteria. UC being processed.  16. Constipation: 12/26: Senna-docusate HS started.   LOS: 6 days A FACE TO FACE EVALUATION WAS PERFORMED  Natalie Contreras 09/12/2019, 2:31 PM

## 2019-09-12 NOTE — Progress Notes (Signed)
Physical Therapy Session Note  Patient Details  Name: Natalie Contreras MRN: [id number] Date of Birth: [birth year]  Today's Date: 09/12/2019 PT Individual Time: 1300-1400 PT Individual Time Calculation (min): 60 min   Short Term Goals: Week 1:  PT Short Term Goal 1 (Week 1): Pt will perform car transfer with LRAD CGA PT Short Term Goal 2 (Week 1): Pt will initiate gait training with LRAD PT Short Term Goal 3 (Week 1): Pt will perform stand<>pivot transfer with LRAD CGA  Skilled Therapeutic Interventions/Progress Updates:   Received pt sitting EOB, pt agreeable to therapy, and stated pain 8/10 in R residual limb. Pt stated that she received pain medication prior to session. Pt reported she was transferring to bedside commode last night with nursing and fell. Pt reported she fell on her R residual limb and required +2 assist to get up. Pt reported increased R residual limb pain and stated she was unable to move the limb this morning, but it's doing better now. Session focused on functional mobility/transfers, LE strength, dynamic standing balance, and improved activity tolerance. Pt requested to use restroom prior to session. Pt transferred stand<>pivot bed<>WC CGA without AD. Pt performed toilet transfer min A and required mod A for clothing management. Pt donned pants in sitting with min A but required mod A to pull pants over hips upon standing. Pt washed hands seated in WC with supervision. Pt performed WC mobility 156ft with bilateral UE and supervision. Pt transferred stand<>pivot WC<>mat without AD CGA. Pt performed standing dynamic balance clipping clothespins to basketball net x 3 trials with RW CGA. Pt reported increased fatigue and RLE pain and requested to perform supine exercises. Pt performed supine R LE SLR 2x12 reps and RLE hip abduction 2x12. Pt transferred supine<>sit with supervision. Pt performed seated LLE hip flexion 2x12 and biilateral knee extension 2x12. Pt transferred  stand<>pivot mat<>WC CGA without AD. Concluded session with pt sitting EOB, needs within reach, bed alarm on, and daughter [name] present at bedside.   Therapy Documentation Precautions:  Precautions Precautions: Fall Precaution Comments: R BKA, wound vac Required Braces or Orthoses: Other Brace Other Brace: R BKA limb guard Restrictions Weight Bearing Restrictions: Yes RLE Weight Bearing: Non weight bearing Other Position/Activity Restrictions: "strict NWB"   Therapy/Group: Individual Therapy Alfonse Alpers PT, DPT   09/12/2019, 7:34 AM

## 2019-09-12 NOTE — Progress Notes (Signed)
At 2115, staff transferring patient from [facility], reports the [facility] slipped and patient's BKA hit the bed or floor per patient and staff. Dressing removed,  Assessed incision 3 areas with small amount of bright red blood. Paged Dr. Adam Phenix R/T above info. No new orders at this time. At 2054, PRN robaxin given and at 2057 PRN trazodone 50mg 's given. Continued to C/O pain, Oxy IR not due, PRN ultram 50mg 's given at . Slept and rested good after 2130. No PRN meds given until oxy IR given at 0636. UA C&S sent.Patrici Ranks A

## 2019-09-13 ENCOUNTER — Inpatient Hospital Stay (HOSPITAL_COMMUNITY): Payer: Medicaid Other

## 2019-09-13 ENCOUNTER — Inpatient Hospital Stay (HOSPITAL_COMMUNITY): Payer: Medicaid Other | Admitting: Occupational Therapy

## 2019-09-13 DIAGNOSIS — E669 Obesity, unspecified: Secondary | ICD-10-CM

## 2019-09-13 LAB — URINE CULTURE

## 2019-09-13 LAB — CBC
HCT: 30 % — ABNORMAL LOW (ref 36.0–46.0)
Hemoglobin: 9.3 g/dL — ABNORMAL LOW (ref 12.0–15.0)
MCH: 26.8 pg (ref 26.0–34.0)
MCHC: 31 g/dL (ref 30.0–36.0)
MCV: 86.5 fL (ref 80.0–100.0)
Platelets: 289 10*3/uL (ref 150–400)
RBC: 3.47 MIL/uL — ABNORMAL LOW (ref 3.87–5.11)
RDW: 17 % — ABNORMAL HIGH (ref 11.5–15.5)
WBC: 6.8 10*3/uL (ref 4.0–10.5)
nRBC: 0 % (ref 0.0–0.2)

## 2019-09-13 LAB — BASIC METABOLIC PANEL
Anion gap: 11 (ref 5–15)
BUN: 48 mg/dL — ABNORMAL HIGH (ref 6–20)
CO2: 29 mmol/L (ref 22–32)
Calcium: 8.6 mg/dL — ABNORMAL LOW (ref 8.9–10.3)
Chloride: 101 mmol/L (ref 98–111)
Creatinine, Ser: 1.18 mg/dL — ABNORMAL HIGH (ref 0.44–1.00)
GFR calc Af Amer: >60 mL/min (ref >60)
GFR calc non Af Amer: 53 mL/min — ABNORMAL LOW (ref >60)
Glucose, Bld: 206 mg/dL — ABNORMAL HIGH (ref 70–99)
Potassium: 4.7 mmol/L (ref 3.5–5.1)
Sodium: 141 mmol/L (ref 135–145)

## 2019-09-13 LAB — GLUCOSE, CAPILLARY
Glucose-Capillary: 124 mg/dL — ABNORMAL HIGH (ref 70–99)
Glucose-Capillary: 140 mg/dL — ABNORMAL HIGH (ref 70–99)
Glucose-Capillary: 140 mg/dL — ABNORMAL HIGH (ref 70–99)
Glucose-Capillary: 148 mg/dL — ABNORMAL HIGH (ref 70–99)

## 2019-09-13 MED ORDER — ACETAMINOPHEN 325 MG PO TABS: 325.0000 mg | ORAL_TABLET | ORAL | Status: AC | PRN

## 2019-09-13 MED ORDER — ADULT MULTIVITAMIN W/MINERALS CH: 1.0000 | ORAL_TABLET | Freq: Every day | ORAL | Status: DC

## 2019-09-13 NOTE — Plan of Care (Signed)
  Problem: Consults Goal: RH LIMB LOSS PATIENT EDUCATION Description: Description: See Patient Education module for eduction specifics. Outcome: Progressing   Problem: RH SKIN INTEGRITY Goal: RH STG SKIN FREE OF INFECTION/BREAKDOWN Description: With min assist Outcome: Progressing Goal: RH STG ABLE TO PERFORM INCISION/WOUND CARE W/ASSISTANCE Description: STG Able To Perform Incision/Wound Care With  min Assistance. Outcome: Progressing   Problem: RH SAFETY Goal: RH STG ADHERE TO SAFETY PRECAUTIONS W/ASSISTANCE/DEVICE Description: STG Adhere to Safety Precautions With cues/reminders Assistance/Device. Outcome: Progressing   Problem: RH PAIN MANAGEMENT Goal: RH STG PAIN MANAGED AT OR BELOW PT'S PAIN GOAL Description: At or below level 4 Outcome: Progressing   Problem: RH KNOWLEDGE DEFICIT LIMB LOSS Goal: RH STG INCREASE KNOWLEDGE OF SELF CARE AFTER LIMB LOSS Description: Patient will be able to manage self care at home independently using handouts/educational resources and guides for management of diabetes including meal coverage , CBG monitoring and medications with wound care of BKA site Outcome: Progressing

## 2019-09-13 NOTE — Progress Notes (Signed)
Physical Therapy Session Note  Patient Details  Name: Natalie Contreras MRN: [id number] Date of Birth: 04/13/1966  Today's Date: 09/13/2019 PT Individual Time: 1300-1400 PT Individual Time Calculation (min): 60 min   Short Term Goals: Week 1:  PT Short Term Goal 1 (Week 1): Pt will perform car transfer with LRAD CGA PT Short Term Goal 2 (Week 1): Pt will initiate gait training with LRAD PT Short Term Goal 3 (Week 1): Pt will perform stand<>pivot transfer with LRAD CGA  Skilled Therapeutic Interventions/Progress Updates:    Session focused on functional transfer training with RW, UE and core strengthening to aid with overall mobility, bed mobility retraining, and amputee education.  Pt performed sit <> stands with overall CGA and cues for proper technique (push up from chair vs pull up on RW) and safety. Able to transfer stand pivot with RW with overall CGA (closer to supervision going to the  L) with more assist to the R due to poor clearance of L foot on floor and decreased ability to pivot. Pt reports she plan to do stand pivots vs practicing squat pivot technique (likely would require less assist) and plans to set up most transfer to the L and have her caregiver assist with this. Therefore, downgraded transfer goals to overall supervision due to preferred technique.   Focused on UE strengthening exercises with 3# dumbbells including bicep curls, hammer curls, chest press with scapular retraction, overhead press, and  Modified shoulder flexion to 90 degrees x 10 reps each x 2 sets with cues for technique and available modifications due to LUE weaker than RUE. W/c propulsion to/from therapy gym with overall supervision for cues for improved technique and noted L steer bias due to weakness on L.   Blocked practice sit <.> stands x 5 reps with RW with close supervision using back of mat for counter balance and support while in standing. PT rewrapped ACE wrap on residual limb and educated on  technique as well as use of shrinker if possible for better shaping and increased independence. She states she has one in room and will try it out later. Education also provided on importance of protection of LLE and prosthetic training down the road. Recommending she get fitted for custom shoe on L due to h/o amputation - pt states she had already been thinking about that and discussed it.   Performed toilet transfers with grab bar for support with CGA overall. Pt able to manage pants down and hygiene, but required assist with pants up. Maintains balance with close supervision to CGA. CGA for transfer back to w/c and then back to bed at end of session.   Therapy Documentation Precautions:  Precautions Precautions: Fall Precaution Comments: R BKA, wound vac Required Braces or Orthoses: Other Brace Other Brace: R BKA limb guard Restrictions Weight Bearing Restrictions: Yes RLE Weight Bearing: Non weight bearing Other Position/Activity Restrictions: "strict NWB"   Pain: No complaints of pain.     Therapy/Group: Individual Therapy  Canary Brim Ivory Broad, PT, DPT, CBIS  09/13/2019, 2:15 PM

## 2019-09-13 NOTE — Progress Notes (Signed)
At 2010, PRN robaxin and ultram given. Complaining of incisional pain. Denies phantom pain. Ace wrap in place to right BKA. LLE with increased edema, especially to top of foot. Miralax and prune juice given on previous shift. Refusing dulcolax supp. Normal pattern, per patient is every 3-4 days. "i'm not worried about it." LBM > 1week. PRN trazodone given at HS. At 2238, PRN oxy IR 10mg 's. Given for C/O right leg pain. Patrici Ranks A

## 2019-09-13 NOTE — Progress Notes (Signed)
Social Work Patient ID: Natalie Contreras, female   DOB: 12-13-1965, 53 y.o.   MRN: [id number]  Family education set up for tomorrow with daughter and caregiver-Teresa to be here at 10:00-12:00 in preparation for discharge Thursday. Team aware of this.

## 2019-09-13 NOTE — Plan of Care (Signed)
  Problem: RH Ambulation Goal: LTG Patient will ambulate in controlled environment (PT) Description: LTG: Patient will ambulate in a controlled environment, # of feet with assistance (PT). Outcome: Not Applicable Flowsheets (Taken 09/13/2019 1405) LTG: Pt will ambulate in controlled environ  assist needed:: (D/c) -- Note: D/c due to not functional Goal: LTG Patient will ambulate in home environment (PT) Description: LTG: Patient will ambulate in home environment, # of feet with assistance (PT). Outcome: Not Applicable Flowsheets (Taken 09/13/2019 1405) LTG: Pt will ambulate in home environ  assist needed:: (D/C) -- Note: D/c due to not functional   Problem: RH Bed to Chair Transfers Goal: LTG Patient will perform bed/chair transfers w/assist (PT) Description: LTG: Patient will perform bed to chair transfers with assistance (PT). Flowsheets (Taken 09/13/2019 1405) LTG: Pt will perform Bed to Chair Transfers with assistance level: (downgraded due to prefers stand pivot technique ABG) Supervision/Verbal cueing Note: downgraded due to prefers stand pivot technique ABG   Problem: RH Furniture Transfers Goal: LTG Patient will perform furniture transfers w/assist (OT/PT) Description: LTG: Patient will perform furniture transfers  with assistance (OT/PT). Flowsheets (Taken 09/13/2019 1405) LTG: Pt will perform furniture transfers with assist:: (downgraded due to prefers stand pivot technique ABG) Supervision/Verbal cueing Note: downgraded due to prefers stand pivot technique ABG

## 2019-09-13 NOTE — Discharge Instructions (Signed)
Inpatient Rehab Discharge Instructions  Natalie Contreras Discharge date and time: 09/16/19    Activities/Precautions/ Functional Status: Activity: no lifting, driving, or strenuous exercise till cleared by MD Diet: cardiac diet and diabetic diet Wound Care: keep wound clean and dry   Functional status:  ___ No restrictions     ___ Walk up steps independently ___ 24/7 supervision/assistance   ___ Walk up steps with assistance ___ Intermittent supervision/assistance  ___ Bathe/dress independently ___ Walk with walker     ___ Bathe/dress with assistance ___ Walk Independently    ___ Shower independently ___ Walk with assistance    ___ Shower with assistance ___ No alcohol     ___ Return to work/school ________  Special Instructions:  COMMUNITY REFERRALS UPON DISCHARGE:    Home Health:   PT, OT, RN   [name]   Date of last service:09/16/2019  Medical Equipment/Items Ordered:[name], [name] AND TUB BENCH  Agency/Supplier:[name]   [phone]   My questions have been answered and I understand these instructions. I will adhere to these goals and the provided educational materials after my discharge from the hospital.  Patient/Caregiver Signature _______________________________ Date __________  Clinician Signature _______________________________________ Date __________  Please bring this form and your medication list with you to all your follow-up doctor's appointments.

## 2019-09-13 NOTE — Progress Notes (Signed)
Occupational Therapy Session Note  Patient Details  Name: Natalie Contreras MRN: [id number] Date of Birth: Feb 25, 1966  Today's Date: 09/13/2019 OT Individual Time: 0945-1100 OT Individual Time Calculation (min): 75 min    Short Term Goals: Week 1:  OT Short Term Goal 1 (Week 1): STG = LTGs due to ELOS  Skilled Therapeutic Interventions/Progress Updates:    Patient seated at edge of bed, ready for therapy session.  Sit to stand with CGA, min A for pivot bed to w/c with RW.  Completed SPT with RW to/from toilet min A.  Mod A for clothing management.  Able to self propel w/c 75% of way to/from therapy gym.  Reviewed DME options for tub/shower and discussed transfers both w/c and at an ambulatory level.  She completed SPT to/from tub bench min A, cues for body positioning and center of gravity.  Reviewed basic HM concepts w/c level - she demonstrates good understanding and plan.   Completed UB and trunk mobility and conditioning activities with good tolerance - rest breaks needed between exercises.  Returned to bed CGA, bed alarm set and call bell in reach.    Therapy Documentation Precautions:  Precautions Precautions: Fall Precaution Comments: R BKA, wound vac Required Braces or Orthoses: Other Brace Other Brace: R BKA limb guard Restrictions Weight Bearing Restrictions: Yes RLE Weight Bearing: Non weight bearing Other Position/Activity Restrictions: "strict NWB" General:   Vital Signs:  Pain: Pain Assessment Pain Scale: 0-10 Pain Score: 0-No pain Faces Pain Scale: No hurt Other Treatments:     Therapy/Group: Individual Therapy  Carlos Levering 09/13/2019, 12:01 PM

## 2019-09-13 NOTE — Progress Notes (Signed)
[place] PHYSICAL MEDICINE & REHABILITATION PROGRESS NOTE  Subjective/Complaints: Patient seen sitting up in bed this morning.  She states he slept well overnight.  She states she had a fall over the weekend, however discussed with nursing, appears to have had limited trauma.  ROS: Denies CP, shortness of breath, nausea, vomiting, diarrhea.  Objective: Vital Signs: Blood pressure 135/62, pulse 72, temperature 97.8 F (36.6 C), resp. rate 18, last menstrual period 12/16/2015, SpO2 100 %. No results found. Recent Labs    09/13/19 0835  WBC 6.8  HGB 9.3*  HCT 30.0*  PLT 289   Recent Labs    09/13/19 0835  NA 141  K 4.7  CL 101  CO2 29  GLUCOSE 206*  BUN 48*  CREATININE 1.18*  CALCIUM 8.6*    Physical Exam: BP 135/62 (BP Location: Left Arm)   Pulse 72   Temp 97.8 F (36.6 C)   Resp 18   LMP 12/16/2015   SpO2 100%   Constitutional: No distress . Vital signs reviewed. HENT: Normocephalic.  Atraumatic. Eyes: EOMI. No discharge. Cardiovascular: No JVD. Respiratory: Normal effort.  No stridor. GI: Non-distended. Skin: BKA incision with staples and some erythema along staple lines.  C/D/I Psych: Normal mood.  Normal behavior. Musc: Right BKA with edema and tenderness Left foot amputations of digits 2-5 Neuro: Alert Motor: Bilateral upper extremities: 5/5 proximal distal Right lower extremity: Hip flexion, knee extension 4+/5 Left lower extremity: 4+/5 proximal distal, stable  Assessment/Plan: 1. 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  Physiatrist is providing close team supervision and 24 hour management of active medical problems listed below.  Physiatrist and rehab team continue to assess barriers to discharge/monitor patient progress toward functional and medical goals  Care Tool:  Bathing    Body  parts bathed by patient: Right arm, Left arm, Chest, Abdomen, Front perineal area, Right upper leg, Left upper leg, Face   Body parts bathed by helper: Buttocks, Left lower leg     Bathing assist Assist Level: Moderate Assistance - Patient 50 - 74%     Upper Body Dressing/Undressing Upper body dressing   What is the patient wearing?: Pull over shirt    Upper body assist Assist Level: Supervision/Verbal cueing    Lower Body Dressing/Undressing Lower body dressing      What is the patient wearing?: Pants     Lower body assist Assist for lower body dressing: Moderate Assistance - Patient 50 - 74%     Toileting Toileting    Toileting assist Assist for toileting: Minimal Assistance - Patient > 75%     Transfers Chair/bed transfer  Transfers assist     Chair/bed transfer assist level: Contact Guard/Touching assist     Locomotion Ambulation   Ambulation assist   Ambulation activity did not occur: Refused(Pt refused due to fear of falling, and LLE weakness)          Walk 10 feet activity   Assist  Walk 10 feet activity did not occur: Refused(Pt refused due to fear of falling, and LLE weakness)        Walk 50 feet activity   Assist Walk 50 feet with 2 turns activity did not occur: Refused(Pt refused due to fear of falling, and LLE weakness)         Walk 150 feet activity   Assist Walk 150 feet activity did  not occur: Refused(Pt refused due to fear of falling, and LLE weakness)         Walk 10 feet on uneven surface  activity   Assist Walk 10 feet on uneven surfaces activity did not occur: Refused(Pt refused due to fear of falling, and LLE weakness)         Wheelchair     Assist Will patient use wheelchair at discharge?: Yes Type of Wheelchair: Manual    Wheelchair assist level: Supervision/Verbal cueing Max wheelchair distance: 133ft    Wheelchair 50 feet with 2 turns activity    Assist        Assist Level:  Supervision/Verbal cueing   Wheelchair 150 feet activity     Assist Wheelchair 150 feet activity did not occur: Safety/medical concerns(fatigue, increased discomfort in bilateral UE's)   Assist Level: Supervision/Verbal cueing      Medical Problem List and Plan: 1.  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  Continue CIR 2.  Antithrombotics: -DVT/anticoagulation:  Pharmaceutical: Lovenox             -antiplatelet therapy: ASA 3. Pain Management: Oxycodone prn, frequency decreased on 12/25.   Controlled with meds on 12/28 4. Mood: LCSW to follow for evaluation and support.              -antipsychotic agents: N/A 5. Neuropsych: This patient is capable of making decisions on her own behalf. 6. Skin/Wound Care: Routine pressure relief measures.  Added protein supplement to promote wound healing.  7. Fluids/Electrolytes/Nutrition: Monitor I/O.  8. HTN: Continue to monitor BP   Cont coreg bid   Hold Demadex daily due to BUN  Slightly labile on 12/28  Monitor with increased mobility 9. T2DM with neuropathy/hyperglycemia: Monitor BS ac/hs. On Levemir at bedtime and  Metformin/canaflifozin in am.   Slightly labile on 12/28  Monitor with increased mobility 10.  CKD: Baseline SCr around 1.3 per record. Continue to encourage fluid intake.   Creatinine 1.18 on 12/28  Encourage fluids  Holding diuretic  Continue to monitor 11.  Hypoalbuminemia  Supplement initiated 12.  Acute blood loss anemia  Hemoglobin 9.3 on 12/28  Continue to monitor 13.  Morbid obesity  Encourage weight loss 14.  Tobacco abuse  Discussed inability to use e-cigarettes while in hospital with nursing. 15. Cloudy urine: UA/UC ordered 12/26.  12/27: UA negative except for rare bacteria. UC with multiple species  Asymptomatic at this time, will hold off on further testing  16. Constipation: 12/26: Senna-docusate HS started.   Continue to  monitor  LOS: 7 days A FACE TO FACE EVALUATION WAS PERFORMED  Kouper Spinella Lorie Phenix 09/13/2019, 3:55 PM

## 2019-09-13 NOTE — Progress Notes (Addendum)
Physical Therapy Session Note  Patient Details  Name: Natalie Contreras MRN: [id number] Date of Birth: June 01, 1966  Today's Date: 09/13/2019 PT Individual Time: 1500-1600 PT Individual Time Calculation (min): 60 min   Short Term Goals: Week 1:  PT Short Term Goal 1 (Week 1): Pt will perform car transfer with LRAD CGA PT Short Term Goal 2 (Week 1): Pt will initiate gait training with LRAD PT Short Term Goal 3 (Week 1): Pt will perform stand<>pivot transfer with LRAD CGA  Skilled Therapeutic Interventions/Progress Updates:     Patient in bed with her daughter in the room upon PT arrival. Patient alert and agreeable to PT session. Patient reported 4/10 R residual limb pain during session, RN made aware. PT provided repositioning, rest breaks, and distraction as pain interventions throughout session.   Therapeutic Activity: Bed Mobility: Patient performed supine to/from sit with supervision with use of hospital bed functions. Patient educated on decreasing use of hospital bed functions to simulate home environment in preparation for d/c.  Transfers: Patient performed stand pivot x3 with CGA-min A. Provided verbal cues for hand placement on RW and reaching back to sit. Patient with decreased control on descent demonstrating unsafe technique. Patient reports that she only feels safe when holding onto the RW throughout the transfer. PT educated on decreased safety due to not reaching back to sit and pulling the RW back when standing. Patient agreed to try demonstrated technique with R hand on RW and L pushing up/reaching back, however, continued to insist that holding the RW feels safer for her.  She performed lateral scoot transfers R x2 and L x1 with close supervision for safety. Provided cues for w/c set up, hand placement, lifting hips rather than scooting for improved skin integrity, and head-hips relationship.  Discussed transfers at home and identified safe and unsafe furniture transfers at home.  Determined the high firm sofa would be a safe transfer, but the low soft sofa and low kitchen chairs may not be safe transfers. Educated on determining safety of home furniture with [place] after d/c.   Wheelchair Mobility:  Patient propelled wheelchair 47 feet x2 with supervision. Provided verbal cues for pushing with B UEs simultaneously and reaching hands further back on the wheels for increased momentum with propulsion. Demonstrated and provided cues for donning/doffing leg rest and limb pad and patient donned/doffed with min A x1 and supervision for cuing x1. Also demonstrated donning/doffing on arm rests for transfers and patient was able to perform this with min cues and supervision. Patient used breaks appropriately with mod cues throughout session.   Therapeutic Exercise: Patient performed the following exercises with verbal and tactile cues for proper technique. -w/c push-up with L LE assist 2x5, educated on use of pressure relief every 30 min when in w/c -R LAQ 2x10 Educated on benefits of retaining knee extension for prosthesis and discussed changing ACE wrap every 4 hours or donning shrinker when able throughout the day. Patient in agreement and stated understanding.   Patient in bed with her daughter in the room at end of session with breaks locked, bed alarm set, and all needs within reach.    Therapy Documentation Precautions:  Precautions Precautions: Fall Precaution Comments: R BKA, wound vac Required Braces or Orthoses: Other Brace Other Brace: R BKA limb guard Restrictions Weight Bearing Restrictions: Yes RLE Weight Bearing: Non weight bearing Other Position/Activity Restrictions: "strict NWB"    Therapy/Group: Individual Therapy  Roseanne Juenger L Emary Zalar PT, DPT  09/13/2019, 6:24 PM

## 2019-09-14 ENCOUNTER — Inpatient Hospital Stay (HOSPITAL_COMMUNITY): Payer: Medicaid Other | Admitting: Occupational Therapy

## 2019-09-14 ENCOUNTER — Ambulatory Visit (HOSPITAL_COMMUNITY): Payer: Medicaid Other

## 2019-09-14 ENCOUNTER — Encounter (HOSPITAL_COMMUNITY): Payer: Medicaid Other | Admitting: Occupational Therapy

## 2019-09-14 LAB — GLUCOSE, CAPILLARY
Glucose-Capillary: 97 mg/dL (ref 70–99)
Glucose-Capillary: 132 mg/dL — ABNORMAL HIGH (ref 70–99)
Glucose-Capillary: 138 mg/dL — ABNORMAL HIGH (ref 70–99)

## 2019-09-14 LAB — SURGICAL PATHOLOGY

## 2019-09-14 MED ORDER — POLYETHYLENE GLYCOL 3350 17 G PO PACK
17.0000 g | PACK | Freq: Two times a day (BID) | ORAL | Status: DC
  Filled 2019-09-14 (×3): qty 1

## 2019-09-14 NOTE — Progress Notes (Signed)
Social Work Patient ID: Natalie Contreras, female   DOB: [birth year], 53 y.o.   MRN: [id number] Family education complete daughter and friend who is there while daughter works. All report it went well and pt feels ready for discharge 12/31.

## 2019-09-14 NOTE — Progress Notes (Signed)
Natalie Contreras  Subjective/Complaints: Patient seen laying in bed this morning.  She states she slept well overnight.  She states she is still trying to wake up.  Discussed medications.  ROS: Denies CP, shortness of breath, nausea, vomiting, diarrhea.  Objective: Vital Signs: Blood pressure (!) 148/56, pulse 71, temperature 98.6 F (37 C), temperature source Oral, resp. rate 18, last menstrual period 12/16/2015, SpO2 94 %. No results found. Recent Labs    09/13/19 0835  WBC 6.8  HGB 9.3*  HCT 30.0*  PLT 289   Recent Labs    09/13/19 0835  NA 141  K 4.7  CL 101  CO2 29  GLUCOSE 206*  BUN 48*  CREATININE 1.18*  CALCIUM 8.6*    Physical Exam: BP (!) 148/56 (BP Location: Right Arm)   Pulse 71   Temp 98.6 F (37 C) (Oral)   Resp 18   LMP 12/16/2015   SpO2 94%   Constitutional: No distress . Vital signs reviewed.  Obese. HENT: Normocephalic.  Atraumatic. Eyes: EOMI. No discharge. Cardiovascular: No JVD. Respiratory: Normal effort.  No stridor. GI: Non-distended. Skin: BKA incision with dressing C/D/I  Psych: Normal mood.  Normal behavior. Musc: Right BKA with edema and tenderness Left foot amputations of digits 2-5 Neuro: Alert Motor:  Right lower extremity: Hip flexion, knee extension 4+/5 Left lower extremity: 4+/5 proximal distal, unchanged  Assessment/Plan: 1. 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  Physiatrist is providing close team supervision and 24 hour management of active medical problems listed below.  Physiatrist and rehab team continue to assess barriers to discharge/monitor patient progress toward functional and medical goals  Care Tool:  Bathing    Body parts bathed by patient: Right arm, Left arm, Chest, Abdomen, Front perineal area, Right  upper leg, Left upper leg, Face   Body parts bathed by helper: Buttocks, Left lower leg     Bathing assist Assist Level: Moderate Assistance - Patient 50 - 74%     Upper Body Dressing/Undressing Upper body dressing   What is the patient wearing?: Pull over shirt    Upper body assist Assist Level: Supervision/Verbal cueing    Lower Body Dressing/Undressing Lower body dressing      What is the patient wearing?: Pants     Lower body assist Assist for lower body dressing: Moderate Assistance - Patient 50 - 74%     Toileting Toileting    Toileting assist Assist for toileting: Minimal Assistance - Patient > 75%     Transfers Chair/bed transfer  Transfers assist     Chair/bed transfer assist level: Minimal Assistance - Patient > 75%     Locomotion Ambulation   Ambulation assist   Ambulation activity did not occur: Refused(Pt refused due to fear of falling, and LLE weakness)          Walk 10 feet activity   Assist  Walk 10 feet activity did not occur: Refused(Pt refused due to fear of falling, and LLE weakness)        Walk 50 feet activity   Assist Walk 50 feet with 2 turns activity did not occur: Refused(Pt refused due to fear of falling, and LLE weakness)         Walk 150 feet activity   Assist Walk 150 feet activity did not occur: Refused(Pt refused due to fear of  falling, and LLE weakness)         Walk 10 feet on uneven surface  activity   Assist Walk 10 feet on uneven surfaces activity did not occur: Refused(Pt refused due to fear of falling, and LLE weakness)         Wheelchair     Assist Will patient use wheelchair at discharge?: Yes Type of Wheelchair: Manual    Wheelchair assist level: Supervision/Verbal cueing Max wheelchair distance: 59'    Wheelchair 50 feet with 2 turns activity    Assist        Assist Level: Supervision/Verbal cueing   Wheelchair 150 feet activity     Assist Wheelchair 150 feet  activity did not occur: Safety/medical concerns(fatigue, increased discomfort in bilateral UE's)   Assist Level: Supervision/Verbal cueing      Medical Problem List and Plan: 1.  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  Continue CIR 2.  Antithrombotics: -DVT/anticoagulation:  Pharmaceutical: Lovenox             -antiplatelet therapy: ASA 3. Pain Management: Oxycodone prn, frequency decreased on 12/25.   Controlled with meds on 12/29 4. Mood: LCSW to follow for evaluation and support.              -antipsychotic agents: N/A 5. Neuropsych: This patient is capable of making decisions on her own behalf. 6. Skin/Wound Care: Routine pressure relief measures.  Added protein supplement to promote wound healing.  7. Fluids/Electrolytes/Nutrition: Monitor I/O.  8. HTN: Continue to monitor BP   Cont coreg bid   Demadex resumed  Relatively controlled on 12/29  Monitor with increased mobility 9. T2DM with neuropathy/hyperglycemia: Monitor BS ac/hs. On Levemir at bedtime and  Metformin  Canaflifozin on hold   Labile on 12/29  Monitor with increased mobility 10.  CKD: Baseline SCr around 1.3 per record. Continue to encourage fluid intake.   Creatinine 1.18 on 12/28  Encourage fluids  Holding diuretic  Continue to monitor 11.  Hypoalbuminemia  Supplement initiated 12.  Acute blood loss anemia  Hemoglobin 9.3 on 12/28, labs ordered for tomorrow  Continue to monitor 13.  Morbid obesity  Encourage weight loss 14.  Tobacco abuse  Discussed inability to use e-cigarettes while in hospital with nursing. 15. Cloudy urine: UA/UC ordered 12/26.  12/27: UA negative except for rare bacteria. UC with multiple species  Asymptomatic at this time, will hold off on further testing  16. Constipation: 12/26: Senna-docusate HS started.   Increased on 12/29  LOS: 8 days A FACE TO FACE EVALUATION WAS PERFORMED  Natalie Contreras 09/14/2019, 7:56 AM

## 2019-09-14 NOTE — Progress Notes (Signed)
Occupational Therapy Session Note  Patient Details  Name: Natalie Contreras MRN: [id number] Date of Birth: 1966-09-06  Today's Date: 09/14/2019 OT Individual Time: [id number] OT Individual Time Calculation (min): 32 min    Short Term Goals: Week 1:  OT Short Term Goal 1 (Week 1): STG = LTGs due to ELOS  Skilled Therapeutic Interventions/Progress Updates:    Patient seated edge of bed, ready for therapy session.  Sit to stand and SPT to/from bed, w/c and toilet with CGA using RW or grab bars.  Completed LB bathing seated on toilet.  LB dressing with set up, clothing management min/mod A in stance with grab bars for support.  Completed grooming tasks mod I / set up level seated in w/c.  Reviewed skin inspection - provided mirror.  Rewrapped residual limb.  Completed standing balance activities.  She returned to bed at close of session, bed alarm set and call bell in reach.    Therapy Documentation Precautions:  Precautions Precautions: Fall Precaution Comments: R BKA, wound vac Required Braces or Orthoses: Other Brace Other Brace: R BKA limb guard Restrictions Weight Bearing Restrictions: Yes RLE Weight Bearing: Non weight bearing Other Position/Activity Restrictions: "strict NWB" General:   Vital Signs: Therapy Vitals Pulse Rate: 72 BP: 135/75 Patient Position (if appropriate): Sitting Pain: Pain Assessment Pain Scale: 0-10 Pain Score: 0-No pain Faces Pain Scale: No hurt   Other Treatments:     Therapy/Group: Individual Therapy  Carlos Levering 09/14/2019, 12:15 PM

## 2019-09-14 NOTE — Progress Notes (Signed)
Social Work Discharge Note   The overall goal for the admission was met for:   Discharge location: [place]  Length of Stay: Yes-10 DAYS  Discharge activity level: Yes-SUPERVISION-MOD/I WHEELCHAIR LEVEL  Home/community participation: Yes  Services provided included: MD, RD, PT, OT, RN, CM, Pharmacy, Neuropsych and SW  Financial Services: Medicaid  Follow-up services arranged: Home Health: [place], DME: [name] and Patient/Family request agency HH: [facility], DME: [facility] HAS OTHER DME FROM THEM  Comments (or additional information):DAUGHTER AND FRIEND-TERESA HERE TO GO THROUGH THERAPIES AND LEARN HER CARE. BOTH COMFORTABLE WITH THIS AND READY FOR DC  Patient/Family verbalized understanding of follow-up arrangements: Yes  Individual responsible for coordination of the follow-up plan: SELF & [name]-DAUGHTER  Confirmed correct DME delivered: Elease Hashimoto 09/14/2019    Elease Hashimoto

## 2019-09-14 NOTE — Progress Notes (Signed)
Nutrition Follow-up  RD working remotely.  DOCUMENTATION CODES:   Obesity unspecified  INTERVENTION:   - Continue Pro-stat 30 ml po BID, each supplement provides 100 kcal and 15 grams of protein  - MVI with minerals daily  - Encourage continued adequate PO intake  NUTRITION DIAGNOSIS:   Increased nutrient needs related to wound healing as evidenced by estimated needs.  Ongoing, being addressed via supplements  GOAL:   Patient will meet greater than or equal to 90% of their needs  Progressing  MONITOR:   PO intake, Supplement acceptance, Labs, Weight trends, Skin  REASON FOR ASSESSMENT:   Malnutrition Screening Tool    ASSESSMENT:   53 year old female with PMH of T2DM, PAD, ongoing tobacco use. Pt with right foot wound with progressive gangrenous changes and osteomyelitis s/p I&D with partial excision of fifth MT, cuboid and calcaneus on 11/25 with in attempts of limb salvage. Pt continued to have poor wound healing with gangrenous changes and was readmitted on 09/03/19 for right BKA. Pt admitted to CIR on 09/06/19.  12/25 - wound VAC removed  Noted plan for d/c 12/31.  No weights available since admission.  Unable to reach pt via phone call to room.  Pt accepting Pro-stat per [facility] documentation. Pt with good PO intake.  Meal Completion: ~85%  Medications reviewed and include: colace, Pro-stat BID, SSI, Novolog 4 units TID, Levemir 23 units daily, Metformin, MVI with minerals, Miralax, senna, torsemide  Labs reviewed: BUN 48, creatinine 1.18, hemoglobin 9.3 CBG's: 97-148 x 24 hours  Diet Order:   Diet Order            Diet Carb Modified Fluid consistency: Thin; Room service appropriate? Yes  Diet effective now              EDUCATION NEEDS:   Education needs have been addressed  Skin:  Skin Assessment: Skin Integrity Issues: Skin Integrity Issues: Incisions: right leg  Last BM:  09/10/19  Height:   Ht Readings from Last 1 Encounters:   09/03/19 5\' 4"  (1.626 m)    Weight:   Wt Readings from Last 1 Encounters:  09/03/19 96.2 kg    Ideal Body Weight:  51 kg (adjusted for BKA)  BMI:  38.6 kg/m^2 (adjusted for BKA)  Estimated Nutritional Needs:   Kcal:  2000-2200  Protein:  110-130 grams  Fluid:  >/= 2.0 L    Gaynell Face, MS, RD, LDN Inpatient Clinical Dietitian Pager: [phone] Weekend/After Hours: [phone]

## 2019-09-14 NOTE — Progress Notes (Signed)
Physical Therapy Session Note  Patient Details  Name: Natalie Contreras MRN: [id number] Date of Birth: 05/31/1966  Today's Date: 09/14/2019 PT Individual Time: 1000-1042 PT Individual Time Calculation (min): 42 min   Short Term Goals: Week 1:  PT Short Term Goal 1 (Week 1): Pt will perform car transfer with LRAD CGA PT Short Term Goal 2 (Week 1): Pt will initiate gait training with LRAD PT Short Term Goal 3 (Week 1): Pt will perform stand<>pivot transfer with LRAD CGA  Skilled Therapeutic Interventions/Progress Updates:   Received pt sitting EOB, pt agreeable to therapy, and did not state pain level but mentioned she received pain medication prior to session. Family present for family education training. Session focused on functional mobility/transfers, LE strength, and improved endurance. Pt transferred bed<>WC stand<>pivot with RW supervision and increased time. Pt transported to gym in [facility] for time management purposes. Pt performed stand<>pivot car transfer with RW with supervision with therapist x 1 trial and with daughter [name] x 1 trial. Pt able to stand<>pivot to L to get into car but prefers squat/scoot<>pivot getting out of the car due to low surface and unsteadiness transferring to R. Family educated on handling, body mechanics, and positioning to assist with transfer. Pt and family verbalized and demonstrated confidence with task. Pt performed sit<>stand with RW and supervision with therapist steadying WC for safety. Family performed teach back demonstration with sit<>stand transfer and verbalized confidence. Family educated on importance of staying on pt's R side with transfers for safety. Pt performed stand<>pivot from WC<>mat with family friend x 1 with supervision for extra practice. Pt and family friend verbalized confidence. Pt transferred stand<>pivot back to bed without AD and supervision. Concluded session with pt sitting EOB, family present at bedside, needs within reach, and bed  alarm on.   Therapy Documentation Precautions:  Precautions Precautions: Fall Precaution Comments: R BKA, wound vac Required Braces or Orthoses: Other Brace Other Brace: R BKA limb guard Restrictions Weight Bearing Restrictions: Yes RLE Weight Bearing: Non weight bearing Other Position/Activity Restrictions: "strict NWB"   Therapy/Group: Individual Therapy Alfonse Alpers PT, DPT   09/14/2019, 7:35 AM

## 2019-09-14 NOTE — Progress Notes (Signed)
Occupational Therapy Session Note  Patient Details  Name: GENOA JESKE MRN: [id number] Date of Birth: 12-Dec-1965  Today's Date: 09/14/2019 OT Individual Time: 1102-1202 and 1400-1456 OT Individual Time Calculation (min): 60 min and 56 min   Short Term Goals: Week 1:  OT Short Term Goal 1 (Week 1): STG = LTGs due to ELOS  Skilled Therapeutic Interventions/Progress Updates:    1)Treatment session with focus on hands on family education in regards to toilet transfers and toileting.  Pt received seated EOB with daughter and friend present. Friend plans to stay with pt and provide majority of physical assistance as needed and daughter will assist when she is available.  Discussed plans for bathing either from [facility] or in kitchen as bathroom is not w/c accessible.  Discussed modifications to technique to increase success with perihygiene.  Completed stand pivot transfers bed <> BSC with RW with CGA progressing to supervision.  Pt demonstrating small pivots to [facility] with increased time and close supervision when transferring to Rt, improved sequencing and transfer to Lt.  Pt able to maintain standing balance to doff pants and completed hygiene in sitting.  Pt required increased time to pull up pants, but able to complete with multiple attempts and close supervision.  Both daughter and friend completed stand pivot transfers with pt with good carryover of education, requiring no physical cues or assistance.  Provided education on limb wrapping and provided with handout for limb wrapping.  Pt remained seated EOB with all needs in reach.  2) Treatment session with focus on functional transfers and care for residual limb.  Upon arrival, pt reporting need to have BM.  Completed stand pivot transfer with RW bed > w/c with supervision.  Supervision stand pivot transfer w/c > toilet with use of grab bar.  Pt with continent BM on toilet, requiring assistance for clothing management post BM due to fatigue post BM.   Utilized RW for transfer back to w/c as pt with difficulty standing from commode seat with just grab bar.  Provided pt with "First Step" publication and directed her attention to the sections on coping, pain management, and preparing for prosthesis.  Educated on limb massage, tapping, and desensitization strategies.  Pt with reports of extreme pain in current position, requesting pain meds.  Discussed alternative pain management strategies to use in conjunction with medication.  Pt pleased with information provided and plans to read more information as she is motivated to take good care of her residual limb.  Therapy Documentation Precautions:  Precautions Precautions: Fall Precaution Comments: R BKA, wound vac Required Braces or Orthoses: Other Brace Other Brace: R BKA limb guard Restrictions Weight Bearing Restrictions: Yes RLE Weight Bearing: Non weight bearing Other Position/Activity Restrictions: "strict NWB" General:   Vital Signs: Therapy Vitals Temp: 98.6 F (37 C) Temp Source: Oral Pulse Rate: 72 Resp: 18 BP: (!) 143/51 Patient Position (if appropriate): Lying Oxygen Therapy SpO2: 94 % O2 Device: Room Air Pain: 1)Pt with no c/o pain 2) Pain Assessment Pain Scale: 0-10 Pain Score: 10-Worst pain ever Pain Type: Acute pain;Surgical pain Pain Location: Leg Pain Orientation: Right Pain Descriptors / Indicators: Shooting Pain Intervention(s): RN made aware   Therapy/Group: Individual Therapy  Simonne Come 09/14/2019, 3:14 PM

## 2019-09-15 ENCOUNTER — Inpatient Hospital Stay (HOSPITAL_COMMUNITY): Payer: Medicaid Other

## 2019-09-15 ENCOUNTER — Inpatient Hospital Stay (HOSPITAL_COMMUNITY): Payer: Medicaid Other | Admitting: Occupational Therapy

## 2019-09-15 DIAGNOSIS — K5903 Drug induced constipation: Secondary | ICD-10-CM

## 2019-09-15 DIAGNOSIS — Z89519 Acquired absence of unspecified leg below knee: Secondary | ICD-10-CM

## 2019-09-15 LAB — CBC
HCT: 28.7 % — ABNORMAL LOW (ref 36.0–46.0)
Hemoglobin: 9 g/dL — ABNORMAL LOW (ref 12.0–15.0)
MCH: 26.9 pg (ref 26.0–34.0)
MCHC: 31.4 g/dL (ref 30.0–36.0)
MCV: 85.9 fL (ref 80.0–100.0)
Platelets: 309 10*3/uL (ref 150–400)
RBC: 3.34 MIL/uL — ABNORMAL LOW (ref 3.87–5.11)
RDW: 16.9 % — ABNORMAL HIGH (ref 11.5–15.5)
WBC: 6.8 10*3/uL (ref 4.0–10.5)
nRBC: 0 % (ref 0.0–0.2)

## 2019-09-15 LAB — GLUCOSE, CAPILLARY
Glucose-Capillary: 120 mg/dL — ABNORMAL HIGH (ref 70–99)
Glucose-Capillary: 117 mg/dL — ABNORMAL HIGH (ref 70–99)
Glucose-Capillary: 122 mg/dL — ABNORMAL HIGH (ref 70–99)
Glucose-Capillary: 150 mg/dL — ABNORMAL HIGH (ref 70–99)

## 2019-09-15 NOTE — Progress Notes (Signed)
[place] PHYSICAL MEDICINE & REHABILITATION PROGRESS NOTE  Subjective/Complaints: Patient seen sitting up in bed this morning.  She states she slept well overnight.  She states she is looking forward to discharge tomorrow.  Discussed dressing with stump shrinker.   ROS: Denies CP, shortness of breath, nausea, vomiting, diarrhea.  Objective: Vital Signs: Blood pressure (!) 143/58, pulse 68, temperature 98.2 F (36.8 C), temperature source Oral, resp. rate 16, weight 93.1 kg, last menstrual period 12/16/2015, SpO2 98 %. No results found. Recent Labs    09/13/19 0835 09/15/19 0031  WBC 6.8 6.8  HGB 9.3* 9.0*  HCT 30.0* 28.7*  PLT 289 309   Recent Labs    09/13/19 0835  NA 141  K 4.7  CL 101  CO2 29  GLUCOSE 206*  BUN 48*  CREATININE 1.18*  CALCIUM 8.6*    Physical Exam: BP (!) 143/58 (BP Location: Right Arm)   Pulse 68   Temp 98.2 F (36.8 C) (Oral)   Resp 16   Wt 93.1 kg   LMP 12/16/2015   SpO2 98%   BMI 35.24 kg/m   Constitutional: No distress . Vital signs reviewed. Obese.  HENT: Normocephalic.  Atraumatic. Eyes: EOMI. No discharge. Cardiovascular: No JVD. Respiratory: Normal effort.  No stridor. GI: Non-distended. 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  From VAC) Left foot amputations of digits 2-5 Psych: Normal mood.  Normal behavior. Musc: No edema in extremities.  No tenderness in extremities. Neuro: Alert Motor:  Right lower extremity: Hip flexion, knee extension 4+/5 Left lower extremity: 4+/5 proximal distal, stable  Assessment/Plan: 1. 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  Physiatrist is providing close team supervision and 24 hour management of active medical problems listed below.  Physiatrist and rehab team continue to assess  barriers to discharge/monitor patient progress toward functional and medical goals  Care Tool:  Bathing    Body parts bathed by patient: Right arm, Left arm, Chest, Abdomen, Front perineal area, Right upper leg, Left upper leg, Face   Body parts bathed by helper: Buttocks, Left lower leg     Bathing assist Assist Level: Moderate Assistance - Patient 50 - 74%     Upper Body Dressing/Undressing Upper body dressing   What is the patient wearing?: Pull over shirt    Upper body assist Assist Level: Supervision/Verbal cueing    Lower Body Dressing/Undressing Lower body dressing      What is the patient wearing?: Pants     Lower body assist Assist for lower body dressing: Contact Guard/Touching assist     Toileting Toileting    Toileting assist Assist for toileting: Supervision/Verbal cueing     Transfers Chair/bed transfer  Transfers assist     Chair/bed transfer assist level: Supervision/Verbal cueing     Locomotion Ambulation   Ambulation assist   Ambulation activity did not occur: Refused(Pt refused due to fear of falling, and LLE weakness)          Walk 10 feet activity   Assist  Walk 10 feet activity did not occur: Refused(Pt refused due to fear of falling, and LLE weakness)        Walk 50 feet activity   Assist Walk 50 feet with 2 turns activity did not occur: Refused(Pt refused due to fear of falling, and LLE weakness)  Walk 150 feet activity   Assist Walk 150 feet activity did not occur: Refused(Pt refused due to fear of falling, and LLE weakness)         Walk 10 feet on uneven surface  activity   Assist Walk 10 feet on uneven surfaces activity did not occur: Refused(Pt refused due to fear of falling, and LLE weakness)         Wheelchair     Assist Will patient use wheelchair at discharge?: Yes Type of Wheelchair: Manual    Wheelchair assist level: Supervision/Verbal cueing Max wheelchair distance: 18'     Wheelchair 50 feet with 2 turns activity    Assist        Assist Level: Supervision/Verbal cueing   Wheelchair 150 feet activity     Assist Wheelchair 150 feet activity did not occur: Safety/medical concerns(fatigue, increased discomfort in bilateral UE's)   Assist Level: Supervision/Verbal cueing      Medical Problem List and Plan: 1.  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  Continue CIR  Team conference today to discuss current and goals and coordination of care, home and environmental barriers, and discharge planning with nursing, case manager, and therapies.  2.  Antithrombotics: -DVT/anticoagulation:  Pharmaceutical: Lovenox             -antiplatelet therapy: ASA 3. Pain Management: Oxycodone prn, frequency decreased on 12/25.   Controlled with meds on 12/30 4. Mood: LCSW to follow for evaluation and support.              -antipsychotic agents: N/A 5. Neuropsych: This patient is capable of making decisions on her own behalf. 6. Skin/Wound Care: Routine pressure relief measures.  Added protein supplement to promote wound healing.   Stump shrinker to stump 7. Fluids/Electrolytes/Nutrition: Monitor I/O.  8. HTN: Continue to monitor BP   Cont coreg bid   Demadex resumed  Relatively controlled on 12/30  Monitor with increased mobility 9. T2DM with neuropathy/hyperglycemia: Monitor BS ac/hs. On Levemir at bedtime and  Metformin  Canaflifozin on hold   Slightly labile on 12/30  Monitor with increased mobility 10.  CKD: Baseline SCr around 1.3 per record. Continue to encourage fluid intake.   Creatinine 1.18 on 12/28  Encourage fluids  Continue to monitor 11.  Hypoalbuminemia  Supplement initiated 12.  Acute blood loss anemia  Hemoglobin 9.0 on 12/30  Continue to monitor 13.  Morbid obesity  Encourage weight loss 14.  Tobacco abuse  Discussed inability to use e-cigarettes while in  hospital with nursing. 15. Cloudy urine: UA/UC ordered 12/26.  12/27: UA negative except for rare bacteria. UC with multiple species  Asymptomatic at this time, will hold off on further testing  16. Constipation: 12/26: Senna-docusate HS started.   Increased on 12/29  Improving  LOS: 9 days A FACE TO FACE EVALUATION WAS PERFORMED   Lorie Phenix 09/15/2019, 9:55 AM

## 2019-09-15 NOTE — Progress Notes (Signed)
Occupational Therapy Session Note  Patient Details  Name: Natalie Contreras MRN: [id number] Date of Birth: [birth year]  Today's Date: 09/15/2019 OT Individual Time: 1000-1115 OT Individual Time Calculation (min): 75 min    Short Term Goals: Week 1:  OT Short Term Goal 1 (Week 1): STG = LTGs due to ELOS  Skilled Therapeutic Interventions/Progress Updates:    Patient seated at edge of bed, alert and ready for therapy session.  Offered shower but she is planning to complete this afternoon with OT after daughter brings clean clothing.  She completes SPT with RW to//from bed, w/c and mat table with CS.  Min A for w/c set up/leg rest management.  She is able to propel w/c to and from therapy gym with rest breaks.  Completed UB ergometer 2 x 6 minutes, UB/LB and core mobility and strengthening activities with focus on posture.  Completed light stretching activities.  Reviewed family educ session and communication tips upon return to home.  Reviewed safe techniques and set up for transfers.  She returned to bed at close of session.  Bed alarm set and call bell in reach.    Therapy Documentation Precautions:  Precautions Precautions: Fall Precaution Comments: R BKA Required Braces or Orthoses: Other Brace Other Brace: R BKA limb guard Restrictions Weight Bearing Restrictions: Yes RLE Weight Bearing: Non weight bearing Other Position/Activity Restrictions: "strict NWB" General:   Vital Signs:  Pain: Pain Assessment Pain Scale: 0-10 Pain Score: 0-No pain Other Treatments:     Therapy/Group: Individual Therapy  Carlos Levering 09/15/2019, 12:12 PM

## 2019-09-15 NOTE — Progress Notes (Signed)
Occupational Therapy Discharge Summary  Patient Details  Name: Natalie Contreras MRN: [id number] Date of Birth: [birth year]  Patient has met 7 of 10 long term goals due to improved activity tolerance, improved balance, ability to compensate for deficits and improved awareness.  Patient to discharge at overall Supervision level.  Patient's care partner is independent to provide the necessary physical assistance at discharge.  Patient will have caregiver nearly 24/7 and daughter when home to provide the necessary supervision to occasional min assist for transfers and LB dressing.  Reasons goals not met: Pt continues to require min assist with dynamic standing tasks, LB dressing and toileting tasks.  Recommendation:  Patient will benefit from ongoing skilled OT services in home health setting to continue to advance functional skills in the area of BADL and Reduce care partner burden.  Equipment: tub transfer bench  Reasons for discharge: treatment goals met and discharge from hospital  Patient/family agrees with progress made and goals achieved: Yes  OT Discharge Precautions/Restrictions  Precautions Precautions: Fall Precaution Comments: R BKA Required Braces or Orthoses: Other Brace Other Brace: R BKA limb guard Restrictions Weight Bearing Restrictions: Yes RLE Weight Bearing: Non weight bearing General   Vital Signs Therapy Vitals Temp: 98.6 F (37 C) Temp Source: Oral Pulse Rate: 73 BP: (!) 146/62 Oxygen Therapy SpO2: 99 % Pain Pain Assessment Pain Scale: 0-10 Pain Score: 0-No pain ADL ADL Grooming: Independent Where Assessed-Grooming: Sitting at sink Upper Body Bathing: Modified independent Where Assessed-Upper Body Bathing: Shower Lower Body Bathing: Modified independent Where Assessed-Lower Body Bathing: Shower Upper Body Dressing: Modified independent (Device) Where Assessed-Upper Body Dressing: Sitting at sink Lower Body Dressing: Contact guard Where  Assessed-Lower Body Dressing: Sitting at sink, Standing at sink Toileting: Supervision/safety Where Assessed-Toileting: Bedside Commode Toilet Transfer: Close supervision Toilet Transfer Method: Stand pivot Science writer: Geophysical data processor: Close supervision Social research officer, government Method: Squat pivot Vision Baseline Vision/History: Wears glasses Wears Glasses: Reading only Patient Visual Report: No change from baseline Vision Assessment?: No apparent visual deficits Cognition Overall Cognitive Status: Within Functional Limits for tasks assessed Arousal/Alertness: Awake/alert Orientation Level: Oriented X4 Memory: Appears intact Awareness: Appears intact Problem Solving: Appears intact Safety/Judgment: Appears intact Comments: fearful of falling Sensation Sensation Light Touch: Impaired by gross assessment Proprioception: Appears Intact Coordination Gross Motor Movements are Fluid and Coordinated: No Fine Motor Movements are Fluid and Coordinated: Yes Coordination and Movement Description: grossly uncoordinated due to R BKA Finger Nose Finger Test: WNL Heel Shin Test: Decreased R>L due to BKA and weakness Extremity/Trunk Assessment RUE Assessment RUE Assessment: Within Functional Limits General Strength Comments: grossly 5/5 LUE Assessment LUE Assessment: Within Functional Limits General Strength Comments: grossly 5/5   Mieka Leaton 09/15/2019, 3:36 PM

## 2019-09-15 NOTE — Patient Care Conference (Signed)
Inpatient RehabilitationTeam Conference and Plan of Care Update Date: 09/15/2019   Time: 11:50 AM    Patient Name: Natalie Contreras      Medical Record Number: [id number]  Date of Birth: June 26, 1966 Sex: Female         Room/Bed: 4W19C/4W19C-01 Payor Info: Payor: MEDICAID [place] / Plan: MEDICAID [place] ACCESS / Product Type: *No Product type* /    Admit Date/Time:  09/06/2019  1:51 PM  Primary Diagnosis:  S/P BKA (below knee amputation) [facility])  Patient Active Problem List   Diagnosis Date Noted  . Drug induced constipation   . Postoperative pain   . Labile blood pressure   . Labile blood glucose   . Morbid obesity ([place])   . Acute blood loss anemia   . Hypoalbuminemia due to protein-calorie malnutrition ([place])   . Stage 3 chronic kidney disease   . Diabetic peripheral neuropathy ([place])   . S/P BKA (below knee amputation) ([place]) 09/06/2019  . Cutaneous abscess of right foot   . Dehiscence of amputation stump ([place])   . 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 Severe protein-calorie malnutrition ([place])   . Gangrene of right foot ([place])   . Abscess or cellulitis of foot 08/08/2019  . Obesity (BMI 30-39.9) 08/08/2019  . Wound infection 06/13/2019  . Cellulitis 07/17/2017  . Subacute osteomyelitis, right ankle and foot ([place])   . Toe amputation status, left 05/05/2017  . Gangrene of left foot ([place])   . PAD (peripheral artery disease) ([place]) 04/10/2017  . Essential hypertension 04/10/2017  . Chronic diastolic CHF (congestive heart failure) ([place]) 04/10/2017  . AKI (acute kidney injury) ([place]) 04/10/2017  . Foreign body in left foot   . Cellulitis and abscess of foot 04/07/2017  . Carotid stenosis, asymptomatic, bilateral 01/15/2017  . Visual disturbance 01/15/2017  . Tobacco abuse 01/15/2017  . Acute respiratory failure with hypoxia ([place]) 10/30/2016  . Pulmonary hypertension ([place]) 10/29/2016  . Acute diastolic CHF (congestive heart failure) ([place]) 10/28/2016  . Benign essential HTN 10/28/2016   . Hyperlipidemia 10/28/2016  . Diabetic infection of right foot ([place]) 10/28/2016    Expected Discharge Date: Expected Discharge Date: 09/16/19  Team Members Present: Physician leading conference: Dr. Delice Lesch Social Worker Present: Ovidio Kin, LCSW Nurse Present: Judee Clara, LPN Case Manager: Karene Fry, RN PT Present: Becky Sax, PT OT Present: Simonne Come, OT SLP Present: Jettie Booze, CF-SLP PPS Coordinator present : Gunnar Fusi, SLP     Current Status/Progress Goal Weekly Team Focus  Bowel/Bladder   Pt is continent of b/b. LBM 09/14/19- pt refused the miralax, Senna  Pt will remain continent of b/b  Q2h toileting/prn   Swallow/Nutrition/ Hydration             ADL's   CGA to close supervision transfers, CGA to Mod assist for toileting  Mod I bathing and dressing, Supervision transfers and standing  ADL retraining, transfer training, residual limb care, pt education, d/c planning   Mobility   S for stand pivot transfers, w/c mobility 150' Independent, car transfers S, at S goal level.        Communication             Safety/Cognition/ Behavioral Observations            Pain   pt c/o of RLE pain 5/10, increased pain with movement/transfers. PRN oxy/robaxin effective  Pt will be pain free  Assess pain Qshift/ PRN   Skin                *  See Care Plan and progress notes for long and short-term goals.     Barriers to Discharge  Current Status/Progress Possible Resolutions Date Resolved   Nursing                  PT                    OT                  SLP                SW                Discharge Planning/Teaching Needs:  Daughter and friend went through family education in preparation for DC tomorrow, feels ready to go home. Will have 24 hr care      Team Discussion: DM med adjustments, stump dressing in place.  RN cont B/B, pain managed with oxy and robaxin. OT CGA self care, S/mod I goals, will need CGA/S transfers.  PT S goals for  transfers, fam ed done.    Revisions to Treatment Plan: N/A     Medical Summary Current Status: Impaired mobility and ADLs secondary to Right BKA with history of left foot 2 through 5 digit amputations Weekly Focus/Goal: Improve mobility, transfers, DM  Barriers to Discharge: Medical stability;Weight bearing restrictions;Weight;Wound care   Possible Resolutions to Barriers: Therapies, optimize DM, , encourage fluids, stump dressing   Continued Need for Acute Rehabilitation Level of Care: The patient requires daily medical management by a physician with specialized training in physical medicine and rehabilitation for the following reasons: Direction of a multidisciplinary physical rehabilitation program to maximize functional independence : Yes Medical management of patient stability for increased activity during participation in an intensive rehabilitation regime.: Yes Analysis of laboratory values and/or radiology reports with any subsequent need for medication adjustment and/or medical intervention. : Yes   I attest that I was present, lead the team conference, and concur with the assessment and plan of the team.   Retta Diones 09/18/2019, 12:17 PM  Team conference was held via web/ teleconference due to [employer] - 19

## 2019-09-15 NOTE — Progress Notes (Signed)
Physical Therapy Discharge Summary  Patient Details  Name: Natalie Contreras MRN: [id number] Date of Birth: 01-06-1966  Today's Date: 09/15/2019 PT Individual Time: 0800-0900 PT Individual Time Calculation (min): 60 min    Patient has met 8 of 8 long term goals due to improved activity tolerance, improved balance, improved postural control, increased strength, decreased pain, improved awareness and improved coordination. Patient to discharge at a wheelchair level Supervision. Patient's care partner is independent to provide the necessary physical assistance at discharge. Pt's daughter and family friend attended family education and verbalized and demonstrated confidence with transfers and tasks required for D/C home. Pt will have 24/7 supervision.   All goals met  Recommendation:  Patient will benefit from ongoing skilled PT services in home health setting to continue to advance safe functional mobility, address ongoing impairments in transfers, LE strength, dynamic sitting and standing balance, endurance, and to minimize fall risk.  Equipment: RW and R elevating legrest   Reasons for discharge: treatment goals met and discharge from hospital  Patient/family agrees with progress made and goals achieved: Yes  Today's Interventions Received pt sitting EOB, pt agreeable to therapy and denied any pain throughout session. Pt states RN just helped place shrinker. Session focused on functional mobility/transfers, simulated car transfers, LE and UE strength, balance, and improved endurance. Pt transferred bed<>WC stand<>pivot without AD supervision. Pt performed WC mobility 157f with bilateral UE's independently. Pt performed car transfer stand<>pivot with RW into car with supervision and squat<>pivot out of car without RW supervision. Pt performed recliner transfer stand<>pivot with RW to R with supervision. Pt practiced with WC parts management with supervision and verbal cues x 4 trials  throughout session. Pt continues to have difficulty donning leg rests but determined to continue practicing. Pt transferred stand<>pivot WC<>mat and mat<>WC without AD supervision. Pt performed UE strengthening exercises including tricep push ups with yoga blocks x 8, trunk rotation with chest press 2x8 with 6lb medicine ball, alternating single arm bicep curls with 5lb dumbell 2x8, and shoulder IR/ER with 5lb weight (driving car) 2x8 with supervision and verbal cues. Pt reported urge to use restroom at end of session and transferred WC<>toilet stand<>pivot without AD supervision. Pt required min A to pull pants up but able to take them off with supervision. Concluded session with pt sitting EOB, needs within reach, and bed alarm on.   PT Discharge Precautions/Restrictions Precautions Precautions: Fall Precaution Comments: R BKA Required Braces or Orthoses: Other Brace Other Brace: R BKA limb guard Restrictions Weight Bearing Restrictions: Yes RLE Weight Bearing: Non weight bearing Cognition Overall Cognitive Status: Within Functional Limits for tasks assessed Arousal/Alertness: Awake/alert Orientation Level: Oriented X4 Memory: Appears intact Awareness: Appears intact Problem Solving: Appears intact Safety/Judgment: Appears intact Comments: fearful of falling Sensation Sensation Light Touch: Impaired by gross assessment Proprioception: Appears Intact Coordination Gross Motor Movements are Fluid and Coordinated: No Fine Motor Movements are Fluid and Coordinated: Yes Coordination and Movement Description: grossly uncoordinated due to R BKA Finger Nose Finger Test: WNL Heel Shin Test: Decreased R>L due to BKA and weakness Motor  Motor Motor: Abnormal postural alignment and control Motor - Skilled Clinical Observations: grossly uncoordinated due to R BKA  Mobility Bed Mobility Bed Mobility: Rolling Right;Rolling Left;Supine to Sit;Sit to Supine Rolling Right: Supervision/verbal  cueing Rolling Left: Supervision/Verbal cueing Supine to Sit: Supervision/Verbal cueing Sit to Supine: Supervision/Verbal cueing Transfers Transfers: Sit to Stand;Stand to Sit;Stand Pivot Transfers;Squat Pivot Transfers Sit to Stand: Supervision/Verbal cueing Stand to Sit: Supervision/Verbal cueing Stand Pivot Transfers:  Supervision/Verbal cueing Stand Pivot Transfer Details: Verbal cues for technique;Verbal cues for precautions/safety;Verbal cues for sequencing Stand Pivot Transfer Details (indicate cue type and reason): verbal cues for technique and LLE foot placement Squat Pivot Transfers: Supervision/Verbal cueing Transfer (Assistive device): Animal nutritionist Mobility: Yes Wheelchair Assistance: Independent with assistive device Wheelchair Propulsion: Both upper extremities Wheelchair Parts Management: Supervision/cueing Distance: 157f  Trunk/Postural Assessment  Cervical Assessment Cervical Assessment: Within Functional Limits Thoracic Assessment Thoracic Assessment: Within Functional Limits Lumbar Assessment Lumbar Assessment: Within Functional Limits Postural Control Postural Control: Deficits on evaluation  Balance Balance Balance Assessed: Yes Static Sitting Balance Static Sitting - Balance Support: No upper extremity supported Static Sitting - Level of Assistance: 7: Independent Dynamic Sitting Balance Dynamic Sitting - Balance Support: No upper extremity supported Dynamic Sitting - Level of Assistance: 7: Independent Static Standing Balance Static Standing - Balance Support: Bilateral upper extremity supported Static Standing - Level of Assistance: 5: Stand by assistance(supervision) Extremity Assessment  RLE Assessment RLE Assessment: Exceptions to [facility]Princ[facility] HealthGeneral Strength Comments: grossly generalized to 4/5 (hip flexion, add, abd) LLE Assessment LLE Assessment: Exceptions to WAdult And Childrens Surgery Center Of Sw FlGeneral Strength Comments: Grossly  generalized to 4/5   AAlfonse AlpersPT, DPT  09/15/2019, 7:39 AM

## 2019-09-15 NOTE — Progress Notes (Signed)
Occupational Therapy Session Note  Patient Details  Name: Natalie Contreras MRN: [id number] Date of Birth: July 18, 1966  Today's Date: 09/15/2019 OT Individual Time: 1400-1500 OT Individual Time Calculation (min): 60 min    Short Term Goals: Week 1:  OT Short Term Goal 1 (Week 1): STG = LTGs due to ELOS  Skilled Therapeutic Interventions/Progress Updates:    Completed ADL retraining at overall Mod I with bathing and UB dressing and supervision for transfers and toileting.  Pt received upright at EOB agreeable to shower.  Completed all stand pivot transfers at Supervision level.  Pt completed bathing from seated level in shower with lateral leans to wash perineal area and buttocks.  Required assistance with LB clothing management due to damp skin post shower.  Discussed lateral leans for clothing management in sitting and recommending close supervision when standing to pull pants over hips.  Pt's daughter present for session and reporting understanding of recommendation.  Answered questions about shrinker wear and care as well as what to expect with prosthetic training.  Reiterated "First Step" publication for additional questions.  Pt pleased with progress and ready for d/c home.  Therapy Documentation Precautions:  Precautions Precautions: Fall Precaution Comments: R BKA Required Braces or Orthoses: Other Brace Other Brace: R BKA limb guard Restrictions Weight Bearing Restrictions: Yes RLE Weight Bearing: Non weight bearing Other Position/Activity Restrictions: "strict NWB" General:   Vital Signs: Therapy Vitals Temp: 98.6 F (37 C) Temp Source: Oral Pulse Rate: 73 BP: (!) 146/62 Oxygen Therapy SpO2: 99 % Pain: Pain Assessment Pain Scale: 0-10 Pain Score: 0-No pain   Therapy/Group: Individual Therapy  Simonne Come 09/15/2019, 3:29 PM

## 2019-09-16 ENCOUNTER — Ambulatory Visit: Payer: Medicaid Other | Admitting: Physician Assistant

## 2019-09-16 LAB — GLUCOSE, CAPILLARY
Glucose-Capillary: 208 mg/dL — ABNORMAL HIGH (ref 70–99)
Glucose-Capillary: 136 mg/dL — ABNORMAL HIGH (ref 70–99)

## 2019-09-16 MED ORDER — METHOCARBAMOL 500 MG PO TABS: 500.0000 mg | ORAL_TABLET | Freq: Four times a day (QID) | ORAL | 0 refills | Status: DC | PRN

## 2019-09-16 MED ORDER — TRAMADOL HCL 50 MG PO TABS: 50.0000 mg | ORAL_TABLET | Freq: Two times a day (BID) | ORAL | 0 refills | Status: DC | PRN

## 2019-09-16 MED ORDER — POLYETHYLENE GLYCOL 3350 17 G PO PACK: 17.0000 g | PACK | Freq: Two times a day (BID) | ORAL | 0 refills | Status: AC

## 2019-09-16 MED ORDER — CARVEDILOL 3.125 MG PO TABS: 3.1250 mg | ORAL_TABLET | Freq: Two times a day (BID) | ORAL | 0 refills | Status: DC

## 2019-09-16 MED ORDER — DOCUSATE SODIUM 100 MG PO CAPS: 100.0000 mg | ORAL_CAPSULE | Freq: Two times a day (BID) | ORAL | 0 refills | Status: DC

## 2019-09-16 MED ORDER — TRAZODONE HCL 50 MG PO TABS: 25.0000 mg | ORAL_TABLET | Freq: Every evening | ORAL | 0 refills | Status: DC | PRN

## 2019-09-16 MED ORDER — METFORMIN HCL 1000 MG PO TABS: 1000.0000 mg | ORAL_TABLET | Freq: Every day | ORAL | 0 refills | Status: DC

## 2019-09-16 MED ORDER — OXYCODONE HCL 5 MG PO TABS: 5.0000 mg | ORAL_TABLET | Freq: Every day | ORAL | 0 refills | Status: DC | PRN

## 2019-09-16 NOTE — Discharge Summary (Addendum)
Physician Discharge Summary  Patient ID: Natalie Contreras MRN: [id number] DOB/AGE: 1965/10/02 53 y.o.  Admit date: 09/06/2019 Discharge date: 09/16/2019  Discharge Diagnoses:  Principal Problem:   S/P BKA (below knee amputation) ([place]) Active Problems:   Essential hypertension   Obesity (BMI 30-39.9)   Acute blood loss anemia   Hypoalbuminemia due to protein-calorie malnutrition (HCC)   Stage 3 chronic kidney disease   Diabetic peripheral neuropathy (HCC)   Postoperative pain   Drug induced constipation   Discharged Condition: good  Significant Diagnostic Studies: No results found.  Labs:  Basic Metabolic Panel: BMP Latest Ref Rng & Units 09/13/2019 09/10/2019 09/07/2019  Glucose 70 - 99 mg/dL 206(H) 131(H) 138(H)  BUN 6 - 20 mg/dL 48(H) 71(H) 47(H)  Creatinine 0.44 - 1.00 mg/dL 1.18(H) 1.41(H) 1.21(H)  Sodium 135 - 145 mmol/L 141 138 139  Potassium 3.5 - 5.1 mmol/L 4.7 4.0 3.7  Chloride 98 - 111 mmol/L 101 100 99  CO2 22 - 32 mmol/L 29 30 28   Calcium 8.9 - 10.3 mg/dL 8.6(L) 8.9 8.8(L)    CBC: CBC Latest Ref Rng & Units 09/15/2019 09/13/2019 09/10/2019  WBC 4.0 - 10.5 K/uL 6.8 6.8 6.2  Hemoglobin 12.0 - 15.0 g/dL 9.0(L) 9.3(L) 10.4(L)  Hematocrit 36.0 - 46.0 % 28.7(L) 30.0(L) 33.8(L)  Platelets 150 - 400 K/uL 309 289 275    CBG: Recent Labs  Lab 09/15/19 0602 09/15/19 1206 09/15/19 1622 09/15/19 2108 09/16/19 0611  GLUCAP 117* 122* 150* 208* 136*    Brief HPI:   Natalie Contreras is a 53 y.o. female with history of T2DM, PAD with ongoing tobacco use with right foot wound who had progressive gangrenous changes with osteomyelitis s/p I and D with partial excision of fifth MT, cuboid and calcaneus on 08/11/2019 due to attempts of limb salvage.  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 Sharol Given.  Postop therapy evaluations completed showing functional decline and CIR recommended for follow-up  therapy   Hospital Course: Natalie Contreras was admitted to rehab 09/06/2019 for inpatient therapies to consist of PT and OT at least three hours five days a week. Past admission physiatrist, therapy team and rehab RN have worked together to provide customized collaborative inpatient rehab. Blood pressures were monitored on TID basis and Demadex was resumed with improvement in control.  Nutritional supplement added due to hypoalbuminemia. Senna S was added to help with OIC. Diabetes has been monitored with ac/hs CBG checks and SSI was use prn for tighter BS control.  She continues on Levemir and Metformin and blood sugars improving with mobility.  Canaflifozin was discontinued due to amputation.   Serial check of L[place] showed AKI and she was encouraged to increase p.o. fluids intake.  Follow-up labs shows improvement in acute on chronic renal failure.  Serial CBC shows acute blood loss anemia and recommend follow-up CBC in 2-3 weeks to monitor for recovery.  Wound VAC was removed on postop day 7 and she was noted to have blister on right thigh question due to wound VAC.  Incision is clean dry and intact and blister has resolved with dry scab. She has been educated on importance of tobacco cessation to help further wound healing. She has made gains during rehab stay and is currently at supervision to modified independent at wheelchair level.  She will continue to receive follow up HHPT and [place] [facility] after discharge  Rehab course: During  patient's stay in rehab weekly team conferences were held to monitor patient's progress, set goals and discuss barriers to discharge. At admission, patient required mod assist with mobility and with basic self care tasks. She  has had improvement in activity tolerance, balance, postural control as well as ability to compensate for deficits.  She is able to complete most  ADL tasks with supervision and requires occasion min assist with LB dressing. She is able to  perform squat pivot transfers with supervision and min assist with LB dynamic standing. She requires supervision for transfers and is able to propel her wheelchair for 150'.   Disposition: Home  Diet: Diabetic.   Special Instructions: 1. Wash incision with soap and water. Pat dry and apply stump shrinker.  2. No smoking.  3. Needs CBC repeated in 2-3 weeks for follow up on ABLA. Discharge Instructions     Ambulatory referral to Physical Medicine Rehab   Complete by: As directed    1-2 weeks transitional care appt      Allergies as of 09/16/2019   No Known Allergies      Medication List     STOP taking these medications    Jardiance 25 MG Tabs tablet Generic drug: empagliflozin       TAKE these medications    acetaminophen 325 MG tablet Commonly known as: TYLENOL Take 1-2 tablets (325-650 mg total) by mouth every 4 (four) hours as needed for mild pain. What changed:  how much to take when to take this reasons to take this   aspirin EC 81 MG tablet Take 1 tablet (81 mg total) by mouth daily with breakfast.   carvedilol 3.125 MG tablet Commonly known as: COREG Take 1 tablet (3.125 mg total) by mouth 2 (two) times daily with a meal. What changed:  medication strength how much to take when to take this   docusate sodium 100 MG capsule Commonly known as: COLACE Take 1 capsule (100 mg total) by mouth 2 (two) times daily.   insulin detemir 100 UNIT/ML injection Commonly known as: LEVEMIR Inject 25 Units into the skin at bedtime.   metFORMIN 1000 MG tablet Commonly known as: GLUCOPHAGE Take 1 tablet (1,000 mg total) by mouth daily with breakfast. What changed: when to take this   methocarbamol 500 MG tablet Commonly known as: ROBAXIN Take 1 tablet (500 mg total) by mouth every 6 (six) hours as needed for muscle spasms.   multivitamin with minerals Tabs tablet Take 1 tablet by mouth daily.   oxyCODONE 5 MG immediate release tablet Commonly known as:  Oxy IR/ROXICODONE Take 1 tablet (5 mg total) by mouth daily as needed for severe pain.   polyethylene glycol 17 g packet Commonly known as: MIRALAX / GLYCOLAX Take 17 g by mouth 2 (two) times daily.   simvastatin 40 MG tablet Commonly known as: ZOCOR Take 40 mg by mouth at bedtime.   torsemide 20 MG tablet Commonly known as: DEMADEX Take 1 tablet (20 mg total) by mouth daily. What changed: when to take this   traMADol 50 MG tablet Commonly known as: ULTRAM Take 1 tablet (50 mg total) by mouth every 12 (twelve) hours as needed for moderate pain.   traZODone 50 MG tablet Commonly known as: DESYREL Take 0.5-1 tablets (25-50 mg total) by mouth at bedtime as needed for sleep.       Follow-up Information     Jamse Arn, MD Follow up.   Specialty: Physical Medicine and Rehabilitation Why: office will  call you with follow up appointment Contact information: [address] [place] [address] [phone]         Newt Minion, MD. Call on 09/20/2019.   Specialty: Orthopedic Surgery Why: for post op appoinment Contact information: [place] [address] [phone]         Celene Squibb, MD. Call on 09/20/2019.   Specialty: Internal Medicine Why: for post hospital follow up Contact information: [facility] [address] [phone]            Signed: Bary Leriche 09/19/2019, 7:37 PM Patient was seen, face-face, and physical exam performed by me on day of discharge, less than 30 minutes of total time spent.. Please see progress note from day of discharge as well.  Delice Lesch, MD, ABPMR

## 2019-09-16 NOTE — Progress Notes (Signed)
Patient discharged home with family, all belongings sent with patient. No s/s of distress noted at this time. No complications noted.  Audie Clear, LPN

## 2019-09-16 NOTE — Progress Notes (Signed)
[facility] PHYSICAL MEDICINE & REHABILITATION PROGRESS NOTE  Subjective/Complaints: Patient seen laying in bed this morning.  She states she slept well overnight.  She is very excited about discharge.  ROS: Denies CP, shortness of breath, nausea, vomiting, diarrhea.  Objective: Vital Signs: Blood pressure (!) 146/70, pulse 70, temperature 97.9 F (36.6 C), resp. rate 16, weight 93.1 kg, last menstrual period 12/16/2015, SpO2 97 %. No results found. Recent Labs    09/15/19 0031  WBC 6.8  HGB 9.0*  HCT 28.7*  PLT 309   No results for input(s): NA, K, CL, CO2, GLUCOSE, BUN, CREATININE, CALCIUM in the last 72 hours.  Physical Exam: BP (!) 146/70 (BP Location: Right Arm)   Pulse 70   Temp 97.9 F (36.6 C)   Resp 16   Wt 93.1 kg   LMP 12/16/2015   SpO2 97%   BMI 35.24 kg/m   Constitutional: No distress . Vital signs reviewed.  Obese. HENT: Normocephalic.  Atraumatic. Eyes: EOMI. No discharge. Cardiovascular: No JVD. Respiratory: Normal effort.  No stridor. GI: Non-distended. Skin: Right BKA with shrinker C/D/I Psych: Normal mood.  Normal behavior. Musc: Right BKA with edema and tenderness Left foot digits 2-5 amputations. Neuro: Alert Motor:  Right lower extremity: Hip flexion, knee extension 4+/5 Left lower extremity: 4+/5 proximal distal, unchanged  Assessment/Plan: 1. 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  Physiatrist is providing close team supervision and 24 hour management of active medical problems listed below.  Physiatrist and rehab team continue to assess barriers to discharge/monitor patient progress toward functional and medical goals  Care Tool:  Bathing    Body parts bathed by patient: Right arm, Left arm, Chest, Abdomen, Front perineal area, Right upper leg, Left upper leg, Face, Buttocks, Left  lower leg   Body parts bathed by helper: Buttocks, Left lower leg Body parts n/a: Right lower leg(Rt BKA)   Bathing assist Assist Level: Independent with assistive device     Upper Body Dressing/Undressing Upper body dressing   What is the patient wearing?: Pull over shirt    Upper body assist Assist Level: Independent with assistive device    Lower Body Dressing/Undressing Lower body dressing      What is the patient wearing?: Pants     Lower body assist Assist for lower body dressing: Contact Guard/Touching assist     Toileting Toileting    Toileting assist Assist for toileting: Supervision/Verbal cueing     Transfers Chair/bed transfer  Transfers assist     Chair/bed transfer assist level: Supervision/Verbal cueing     Locomotion Ambulation   Ambulation assist   Ambulation activity did not occur: Safety/medical concerns(R BKA, decreased balance, LE weakness, fear of falling)          Walk 10 feet activity   Assist  Walk 10 feet activity did not occur: Safety/medical concerns(R BKA, decreased balance, LE weakness, fear of falling)        Walk 50 feet activity   Assist Walk 50 feet with 2 turns activity did not occur: Safety/medical concerns(R BKA, decreased balance, LE weakness, fear of falling)         Walk 150 feet activity   Assist Walk 150 feet activity did not occur: Safety/medical concerns(R BKA, decreased balance, LE weakness, fear of falling)         Walk 10 feet on uneven surface  activity   Assist Walk 10 feet on uneven surfaces activity did not occur: Safety/medical concerns(R BKA, decreased balance, LE weakness, fear of falling)         Wheelchair     Assist Will patient use wheelchair at discharge?: Yes Type of Wheelchair: Manual    Wheelchair assist level: Independent Max wheelchair distance: 16ft    Wheelchair 50 feet with 2 turns activity    Assist        Assist Level: Independent    Wheelchair 150 feet activity     Assist Wheelchair 150 feet activity did not occur: Safety/medical concerns(fatigue, increased discomfort in bilateral UE's)   Assist Level: Independent      Medical Problem List and Plan: 1.  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  DC today  Will see patient for transitional care management in 1-2 weeks post-discharge 2.  Antithrombotics: -DVT/anticoagulation:  Pharmaceutical: Lovenox             -antiplatelet therapy: ASA 3. Pain Management: Oxycodone prn, frequency decreased on 12/25.   Controlled with meds on 12/31 4. Mood: LCSW to follow for evaluation and support.              -antipsychotic agents: N/A 5. Neuropsych: This patient is capable of making decisions on her own behalf. 6. Skin/Wound Care: Routine pressure relief measures.  Added protein supplement to promote wound healing.   Continue stump shrinker to stump 7. Fluids/Electrolytes/Nutrition: Monitor I/O.  8. HTN: Continue to monitor BP   Cont coreg bid   Demadex resumed  Slightly elevated/labile on 12/31, will require ambulatory follow-up with potential adjustments  Monitor with increased mobility 9. T2DM with neuropathy/hyperglycemia: Monitor BS ac/hs. On Levemir at bedtime and  Metformin  Canaflifozin on hold   Slightly labile on 12/31, will require further ambulatory monitoring with potential adjustments  Monitor with increased mobility 10.  CKD: Baseline SCr around 1.3 per record. Continue to encourage fluid intake.   Creatinine 1.18 on 12/28  Encourage fluids  Continue to monitor 11.  Hypoalbuminemia  Supplement initiated 12.  Acute blood loss anemia  Hemoglobin 9.0 on 12/30  Continue to monitor 13.  Morbid obesity  Encourage weight loss 14.  Tobacco abuse  Discussed inability to use e-cigarettes while in hospital with nursing. 15. Cloudy urine: UA/UC ordered 12/26.  12/27: UA negative except  for rare bacteria. UC with multiple species  Asymptomatic at this time, will hold off on further testing  16. Constipation: 12/26: Senna-docusate HS started.   Increased on 12/29  Improving  LOS: 10 days A FACE TO FACE EVALUATION WAS PERFORMED  Ankit Lorie Phenix 09/16/2019, 9:11 AM

## 2019-09-20 ENCOUNTER — Telehealth: Payer: Self-pay

## 2019-09-20 NOTE — Telephone Encounter (Signed)
Otila Kluver from [facility] called states patient had Right BKA 09/03/2019. Would like to know if these wound care orders are okay to proceed with. Keep clean and dry and apply shrinker ?   CB  [phone]

## 2019-09-21 ENCOUNTER — Telehealth: Payer: Self-pay | Admitting: Orthopedic Surgery

## 2019-09-21 ENCOUNTER — Ambulatory Visit (INDEPENDENT_AMBULATORY_CARE_PROVIDER_SITE_OTHER): Payer: Medicaid Other | Admitting: Physician Assistant

## 2019-09-21 ENCOUNTER — Other Ambulatory Visit: Payer: Self-pay

## 2019-09-21 ENCOUNTER — Encounter: Payer: Self-pay | Admitting: Physician Assistant

## 2019-09-21 DIAGNOSIS — M86271 Subacute osteomyelitis, right ankle and foot: Secondary | ICD-10-CM

## 2019-09-21 NOTE — Progress Notes (Signed)
Office Visit Note   Patient: Natalie Contreras           Date of Birth: 12/14/1965           MRN: [id number] Visit Date: 09/21/2019              Requested by: Celene Squibb, MD [place],  [place] [address] PCP: Celene Squibb, MD  No chief complaint on file.     HPI: Patient is 2-1/2 weeks status post below-knee amputation.  She is doing well.  She has been doing daily dressing changes.  Assessment & Plan: Visit Diagnoses: No diagnosis found.  Plan: She will follow up for reevaluation in 1 week.  I did also counsel her on smoking cessation which is crucial to wound healing she did say she is trying to cut down on smoking  Follow-Up Instructions: No follow-ups on file.   Ortho Exam  Patient is alert, oriented, no adenopathy, well-dressed, normal affect, normal respiratory effort. Focused examination demonstrates healing surgical incision with healthy wound edges she does have some slight bloody date drainage but no dehiscence she has some erythema that is focused just around the staples.  No fluctuance no cellulitis  Imaging: No results found. No images are attached to the encounter.  Labs: Lab Results  Component Value Date   HGBA1C 7.0 (H) 09/03/2019   HGBA1C 10.7 (H) 06/13/2019   HGBA1C 7.1 (H) 07/19/2017   ESRSEDRATE 122 (H) 08/08/2019   CRP 11.7 (H) 08/08/2019   REPTSTATUS 09/13/2019 FINAL 09/12/2019   GRAMSTAIN  08/11/2019    FEW WBC PRESENT, PREDOMINANTLY PMN MODERATE GRAM POSITIVE COCCI IN PAIRS RARE GRAM NEGATIVE RODS    CULT MULTIPLE SPECIES PRESENT, SUGGEST RECOLLECTION (A) 09/12/2019   LABORGA ENTEROBACTER AEROGENES 08/11/2019     Lab Results  Component Value Date   ALBUMIN 2.6 (L) 09/07/2019   ALBUMIN 1.7 (L) 08/11/2019   ALBUMIN 1.8 (L) 08/10/2019    Lab Results  Component Value Date   MG 2.1 08/11/2019   MG 2.2 08/10/2019   MG 2.2 03/04/2017   No results found for: VD25OH  No results found for: PREALBUMIN CBC EXTENDED  Latest Ref Rng & Units 09/15/2019 09/13/2019 09/10/2019  WBC 4.0 - 10.5 K/uL 6.8 6.8 6.2  RBC 3.87 - 5.11 MIL/uL 3.34(L) 3.47(L) 3.90  HGB 12.0 - 15.0 g/dL 9.0(L) 9.3(L) 10.4(L)  HCT 36.0 - 46.0 % 28.7(L) 30.0(L) 33.8(L)  PLT 150 - 400 K/uL 309 289 275  NEUTROABS 1.7 - 7.7 K/uL - - 3.1  LYMPHSABS 0.7 - 4.0 K/uL - - 2.0     There is no height or weight on file to calculate BMI.  Orders:  No orders of the defined types were placed in this encounter.  No orders of the defined types were placed in this encounter.    Procedures: No procedures performed  Clinical Data: No additional findings.  ROS:  All other systems negative, except as noted in the HPI. Review of Systems  Objective: Vital Signs: LMP 12/16/2015   Specialty Comments:  No specialty comments available.  PMFS History: Patient Active Problem List   Diagnosis Date Noted  . Drug induced constipation   . Postoperative pain   . Morbid obesity ([place])   . Acute blood loss anemia   . Hypoalbuminemia due to protein-calorie malnutrition ([place])   . Stage 3 chronic kidney disease   . Diabetic peripheral neuropathy ([place])   . S/P BKA (below knee amputation) ([place]) 09/06/2019  .  Cutaneous abscess of right foot   . Dehiscence of amputation stump ([place])   . 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 Severe protein-calorie malnutrition ([place])   . Gangrene of right foot ([place])   . Abscess or cellulitis of foot 08/08/2019  . Obesity (BMI 30-39.9) 08/08/2019  . Wound infection 06/13/2019  . Cellulitis 07/17/2017  . Subacute osteomyelitis, right ankle and foot ([place])   . Toe amputation status, left 05/05/2017  . Gangrene of left foot ([place])   . PAD (peripheral artery disease) ([place]) 04/10/2017  . Essential hypertension 04/10/2017  . Chronic diastolic CHF (congestive heart failure) ([place]) 04/10/2017  . AKI (acute kidney injury) ([place]) 04/10/2017  . Foreign body in left foot   . Cellulitis and abscess of foot 04/07/2017  . Carotid  stenosis, asymptomatic, bilateral 01/15/2017  . Visual disturbance 01/15/2017  . Tobacco abuse 01/15/2017  . Acute respiratory failure with hypoxia ([place]) 10/30/2016  . Pulmonary hypertension ([place]) 10/29/2016  . Acute diastolic CHF (congestive heart failure) ([place]) 10/28/2016  . Benign essential HTN 10/28/2016  . Hyperlipidemia 10/28/2016  . Diabetic infection of right foot ([place]) 10/28/2016   Past Medical History:  Diagnosis Date  . Abscess of foot 08/09/2019   WITH ULCER  RIGHT FOOT  . Acute diastolic CHF (congestive heart failure) ([place]) 10/28/2016  . Carotid stenosis, asymptomatic, bilateral 01/15/2017  . Diabetes mellitus without complication ([place])   . Heart murmur   . Hypertension   . PAD (peripheral artery disease) ([place]) 04/10/2017   Left ABI 0.74. Right ABI 0.96.    Family History  Problem Relation Age of Onset  . Lung cancer Mother   . Lung cancer Father   . AAA (abdominal aortic aneurysm) Brother     Past Surgical History:  Procedure Laterality Date  . ABDOMINAL AORTOGRAM W/LOWER EXTREMITY N/A 08/10/2019   Procedure: ABDOMINAL AORTOGRAM W/LOWER EXTREMITY;  Surgeon: Waynetta Sandy, MD;  Location: [facility];  Service: Cardiovascular;  Laterality: N/A;  . AMPUTATION Right 09/03/2019   Procedure: RIGHT BELOW KNEE AMPUTATION;  Surgeon: Newt Minion, MD;  Location: [facility];  Service: Orthopedics;  Laterality: Right;  . AMPUTATION TOE Left 05/05/2017   Procedure: LEFT SECOND TOE AMPUTATION;  Surgeon: Aviva Signs, MD;  Location: [facility];  Service: General;  Laterality: Left;  . CATARACT EXTRACTION W/PHACO Left 02/08/2016   Procedure: CATARACT EXTRACTION PHACO AND INTRAOCULAR LENS PLACEMENT (IOC);  Surgeon: Tonny Branch, MD;  Location: [facility];  Service: Ophthalmology;  Laterality: Left;  CDE 11.43   . CATARACT EXTRACTION W/PHACO Right 02/26/2016   Procedure: CATARACT EXTRACTION PHACO AND INTRAOCULAR LENS PLACEMENT RIGHT; CDE:  16.90;  Surgeon: Tonny Branch, MD;   Location: [facility];  Service: Ophthalmology;  Laterality: Right;  . CHOLECYSTECTOMY    . FOREIGN BODY REMOVAL Left 04/09/2017   Procedure: FOREIGN BODY REMOVAL ADULT FOOT;  Surgeon: Aviva Signs, MD;  Location: [facility];  Service: General;  Laterality: Left;  . I & D EXTREMITY Right 08/11/2019   Procedure: IRRIGATION AND DEBRIDEMENT EXTREMITY;  Surgeon: Newt Minion, MD;  Location: [facility];  Service: Orthopedics;  Laterality: Right;  . PERIPHERAL VASCULAR ATHERECTOMY Right 08/10/2019   Procedure: PERIPHERAL VASCULAR ATHERECTOMY;  Surgeon: Waynetta Sandy, MD;  Location: [facility];  Service: Cardiovascular;  Laterality: Right;  superficial femoral  . PERIPHERAL VASCULAR BALLOON ANGIOPLASTY Right 08/10/2019   Procedure: PERIPHERAL VASCULAR BALLOON ANGIOPLASTY;  Surgeon: Waynetta Sandy, MD;  Location: [facility];  Service: Cardiovascular;  Laterality:  Right;  anterior tibial  . TRANSMETATARSAL AMPUTATION Left 07/21/2017   Procedure: TRANSMETATARSAL AMPUTATION LEFT FOOT;  Surgeon: Aviva Signs, MD;  Location: [facility];  Service: General;  Laterality: Left;   Social History   Occupational History  . Not on file  Tobacco Use  . Smoking status: Current Every Day Smoker    Packs/day: 0.50    Years: 25.00    Pack years: 12.50    Types: Cigarettes    Start date: 09/16/1984  . Smokeless tobacco: Never Used  Substance and Sexual Activity  . Alcohol use: No  . Drug use: No  . Sexual activity: Yes    Birth control/protection: None

## 2019-09-21 NOTE — Telephone Encounter (Signed)
Received call from Tina[facility]nurse with [facility] needing verbal orders to send orders to Dr. Sharol Given. The number to contact Otila Kluver is [phone]

## 2019-09-21 NOTE — Telephone Encounter (Signed)
Duplicate message. 

## 2019-09-21 NOTE — Telephone Encounter (Signed)
I called and sw tina to advise keep clean, dry and wear shrinker 24/7 she will call with any other questions.

## 2019-09-23 ENCOUNTER — Other Ambulatory Visit: Payer: Self-pay

## 2019-09-23 ENCOUNTER — Telehealth (HOSPITAL_COMMUNITY): Payer: Self-pay

## 2019-09-23 DIAGNOSIS — I739 Peripheral vascular disease, unspecified: Secondary | ICD-10-CM

## 2019-09-23 NOTE — Telephone Encounter (Signed)

## 2019-09-23 NOTE — Progress Notes (Deleted)
HISTORY AND PHYSICAL     CC:  follow up. Requesting Provider:  Celene Squibb, MD  HPI: This is a 54 y.o. female who is here today for follow up.  She has hx of DM, PAD, tobacco use and PAD with right foot wound.  On 08/10/2019, she underwent the following by Dr. Donzetta Matters: 1.  Ultrasound-guided cannulation left common femoral artery 2.  CO2 aortogram and bilateral lower extremity angiography 3.  Jetstream atherectomy right SFA and popliteal arteries with distal filter protection 4.  Drug-coated balloon angioplasty of popliteal artery with 4 x 150 and in.pact and SFA with 5 x 250 in.pact 5.  Mynx device closure left common femoral artery           6.  Moderate sedation with fentanyl and Versed for 109 minutes  She underwent I&D with partial excision of fifth MT, cuboid and calcaneus on 08/11/2019 due to attempts of limb salvage.  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 Sharol Given.  She went to CIR.    The pt returns today for follow up.  The pt is on a statin for cholesterol management.    The pt is on an aspirin.    Other AC:  none The pt is on BB for hypertension.  The pt does have diabetes. Tobacco hx:  ***   Past Medical History:  Diagnosis Date  . Abscess of foot 08/09/2019   WITH ULCER  RIGHT FOOT  . Acute diastolic CHF (congestive heart failure) ([place]) 10/28/2016  . Carotid stenosis, asymptomatic, bilateral 01/15/2017  . Diabetes mellitus without complication ([place])   . Heart murmur   . Hypertension   . PAD (peripheral artery disease) ([place]) 04/10/2017   Left ABI 0.74. Right ABI 0.96.    Past Surgical History:  Procedure Laterality Date  . ABDOMINAL AORTOGRAM W/LOWER EXTREMITY N/A 08/10/2019   Procedure: ABDOMINAL AORTOGRAM W/LOWER EXTREMITY;  Surgeon: Waynetta Sandy, MD;  Location: [facility];  Service: Cardiovascular;  Laterality: N/A;  . AMPUTATION Right 09/03/2019   Procedure:  RIGHT BELOW KNEE AMPUTATION;  Surgeon: Newt Minion, MD;  Location: [facility];  Service: Orthopedics;  Laterality: Right;  . AMPUTATION TOE Left 05/05/2017   Procedure: LEFT SECOND TOE AMPUTATION;  Surgeon: Aviva Signs, MD;  Location: [facility];  Service: General;  Laterality: Left;  . CATARACT EXTRACTION W/PHACO Left 02/08/2016   Procedure: CATARACT EXTRACTION PHACO AND INTRAOCULAR LENS PLACEMENT (IOC);  Surgeon: Tonny Branch, MD;  Location: [facility];  Service: Ophthalmology;  Laterality: Left;  CDE 11.43   . CATARACT EXTRACTION W/PHACO Right 02/26/2016   Procedure: CATARACT EXTRACTION PHACO AND INTRAOCULAR LENS PLACEMENT RIGHT; CDE:  16.90;  Surgeon: Tonny Branch, MD;  Location: [facility];  Service: Ophthalmology;  Laterality: Right;  . CHOLECYSTECTOMY    . FOREIGN BODY REMOVAL Left 04/09/2017   Procedure: FOREIGN BODY REMOVAL ADULT FOOT;  Surgeon: Aviva Signs, MD;  Location: [facility];  Service: General;  Laterality: Left;  . I & D EXTREMITY Right 08/11/2019   Procedure: IRRIGATION AND DEBRIDEMENT EXTREMITY;  Surgeon: Newt Minion, MD;  Location: [place];  Service: Orthopedics;  Laterality: Right;  . PERIPHERAL VASCULAR ATHERECTOMY Right 08/10/2019   Procedure: PERIPHERAL VASCULAR ATHERECTOMY;  Surgeon: Waynetta Sandy, MD;  Location: [facility];  Service: Cardiovascular;  Laterality: Right;  superficial femoral  . PERIPHERAL VASCULAR BALLOON ANGIOPLASTY Right 08/10/2019   Procedure: PERIPHERAL VASCULAR BALLOON ANGIOPLASTY;  Surgeon: Waynetta Sandy, MD;  Location: [facility];  Service: Cardiovascular;  Laterality: Right;  anterior tibial  . TRANSMETATARSAL AMPUTATION Left 07/21/2017   Procedure: TRANSMETATARSAL AMPUTATION LEFT FOOT;  Surgeon: Aviva Signs, MD;  Location: [facility];  Service: General;  Laterality: Left;    No Known Allergies  Current Outpatient Medications  Medication Sig Dispense Refill  . acetaminophen (TYLENOL) 325 MG tablet Take 1-2 tablets (325-650 mg  total) by mouth every 4 (four) hours as needed for mild pain.    [name] aspirin EC 81 MG tablet Take 1 tablet (81 mg total) by mouth daily with breakfast. 30 tablet 2  . carvedilol (COREG) 3.125 MG tablet Take 1 tablet (3.125 mg total) by mouth 2 (two) times daily with a meal. 60 tablet 0  . docusate sodium (COLACE) 100 MG capsule Take 1 capsule (100 mg total) by mouth 2 (two) times daily. 60 capsule 0  . insulin detemir (LEVEMIR) 100 UNIT/ML injection Inject 25 Units into the skin at bedtime.     . metFORMIN (GLUCOPHAGE) 1000 MG tablet Take 1 tablet (1,000 mg total) by mouth daily with breakfast. 30 tablet 0  . methocarbamol (ROBAXIN) 500 MG tablet Take 1 tablet (500 mg total) by mouth every 6 (six) hours as needed for muscle spasms. 45 tablet 0  . Multiple Vitamin (MULTIVITAMIN WITH MINERALS) TABS tablet Take 1 tablet by mouth daily.    [name] oxyCODONE (OXY IR/ROXICODONE) 5 MG immediate release tablet Take 1 tablet (5 mg total) by mouth daily as needed for severe pain. 7 tablet 0  . polyethylene glycol (MIRALAX / GLYCOLAX) 17 g packet Take 17 g by mouth 2 (two) times daily. 60 each 0  . simvastatin (ZOCOR) 40 MG tablet Take 40 mg by mouth at bedtime.     . torsemide (DEMADEX) 20 MG tablet Take 1 tablet (20 mg total) by mouth daily. (Patient taking differently: Take 20 mg by mouth 2 (two) times daily. ) 30 tablet 2  . traMADol (ULTRAM) 50 MG tablet Take 1 tablet (50 mg total) by mouth every 12 (twelve) hours as needed for moderate pain. 14 tablet 0  . traZODone (DESYREL) 50 MG tablet Take 0.5-1 tablets (25-50 mg total) by mouth at bedtime as needed for sleep. 10 tablet 0   No current facility-administered medications for this visit.    Family History  Problem Relation Age of Onset  . Lung cancer Mother   . Lung cancer Father   . AAA (abdominal aortic aneurysm) Brother     Social History   Socioeconomic History  . Marital status: Single    Spouse name: Not on file  . Number of children: Not on  file  . Years of education: Not on file  . Highest education level: Not on file  Occupational History  . Not on file  Tobacco Use  . Smoking status: Current Every Day Smoker    Packs/day: 0.50    Years: 25.00    Pack years: 12.50    Types: Cigarettes    Start date: 09/16/1984  . Smokeless tobacco: Never Used  Substance and Sexual Activity  . Alcohol use: No  . Drug use: No  . Sexual activity: Yes    Birth control/protection: None  Other Topics Concern  . Not on file  Social History Narrative  . Not on file   Social Determinants of Health   Financial Resource Strain:   . Difficulty of Paying Living Expenses: Not on file  Food Insecurity:   .  Worried About Charity fundraiser in the Last Year: Not on file  . Ran Out of Food in the Last Year: Not on file  Transportation Needs:   . Lack of Transportation (Medical): Not on file  . Lack of Transportation (Non-Medical): Not on file  Physical Activity:   . Days of Exercise per Week: Not on file  . Minutes of Exercise per Session: Not on file  Stress:   . Feeling of Stress : Not on file  Social Connections:   . Frequency of Communication with Friends and Family: Not on file  . Frequency of Social Gatherings with Friends and Family: Not on file  . Attends Religious Services: Not on file  . Active Member of Clubs or Organizations: Not on file  . Attends Archivist Meetings: Not on file  . Marital Status: Not on file  Intimate Partner Violence:   . Fear of Current or Ex-Partner: Not on file  . Emotionally Abused: Not on file  . Physically Abused: Not on file  . Sexually Abused: Not on file     REVIEW OF SYSTEMS:  *** [X]  denotes positive finding, [ ]  denotes negative finding Cardiac  Comments:  Chest pain or chest pressure:    Shortness of breath upon exertion:    Short of breath when lying flat:    Irregular heart rhythm:        Vascular    Pain in calf, thigh, or hip brought on by ambulation:    Pain in  feet at night that wakes you up from your sleep:     Blood clot in your veins:    Leg swelling:         Pulmonary    Oxygen at home:    Productive cough:     Wheezing:         Neurologic    Sudden weakness in arms or legs:     Sudden numbness in arms or legs:     Sudden onset of difficulty speaking or slurred speech:    Temporary loss of vision in one eye:     Problems with dizziness:         Gastrointestinal    Blood in stool:     Vomited blood:         Genitourinary    Burning when urinating:     Blood in urine:        Psychiatric    Major depression:         Hematologic    Bleeding problems:    Problems with blood clotting too easily:        Skin    Rashes or ulcers:        Constitutional    Fever or chills:      PHYSICAL EXAMINATION:  ***  General:  WDWN in NAD; vital signs documented above Gait: Not observed HENT: WNL, normocephalic Pulmonary: normal non-labored breathing , without Rales, rhonchi,  wheezing Cardiac: {Desc; regular/irreg:14544} HR, without  Murmurs; {With/Without:20273} carotid bruit*** Abdomen: soft, NT, no masses Skin: {With/Without:20273} rashes Vascular Exam/Pulses:  Right Left  Radial {Exam; arterial pulse strength 0-4:30167} {Exam; arterial pulse strength 0-4:30167}  Ulnar {Exam; arterial pulse strength 0-4:30167} {Exam; arterial pulse strength 0-4:30167}  Femoral {Exam; arterial pulse strength 0-4:30167} {Exam; arterial pulse strength 0-4:30167}  Popliteal {Exam; arterial pulse strength 0-4:30167} {Exam; arterial pulse strength 0-4:30167}  DP {Exam; arterial pulse strength 0-4:30167} {Exam; arterial pulse strength 0-4:30167}  PT {Exam; arterial pulse strength 0-4:30167} {  Exam; arterial pulse strength 0-4:30167}   Extremities: {With/Without:20273} ischemic changes, {With/Without:20273} Gangrene , {With/Without:20273} cellulitis; {With/Without:20273} open wounds;  Musculoskeletal: no muscle wasting or atrophy  Neurologic: A&O X 3;   No focal weakness or paresthesias are detected Psychiatric:  The pt has {Desc; normal/abnormal:11317::"Normal"} affect.   Non-Invasive Vascular Imaging:   ABI's/TBI's on 09/24/2019: Right:  *** Left:  ***   Arterial duplex on 09/24/2019: ***  Previous ABI's/TBI's on 08/09/2019: Right:  0.34 Left:  0.63   ASSESSMENT/PLAN:: 54 y.o. female here for follow up for PAD with hx of RLE SFA and popliteal intervention with subsequent BKA by Dr. Sharol Given and PAD with decreased ABI on the left presents today for follow up.    -***   Leontine Locket, PA-C [facility] [phone]  Clinic MD:   Donzetta Matters

## 2019-09-24 ENCOUNTER — Ambulatory Visit: Payer: Medicaid Other

## 2019-09-24 ENCOUNTER — Encounter (HOSPITAL_COMMUNITY): Payer: Medicaid Other

## 2019-09-27 ENCOUNTER — Encounter: Payer: Medicaid Other | Admitting: Physical Medicine & Rehabilitation

## 2019-09-28 ENCOUNTER — Other Ambulatory Visit: Payer: Self-pay

## 2019-09-28 ENCOUNTER — Encounter: Payer: Self-pay | Admitting: Orthopedic Surgery

## 2019-09-28 ENCOUNTER — Ambulatory Visit (INDEPENDENT_AMBULATORY_CARE_PROVIDER_SITE_OTHER): Payer: Medicaid Other | Admitting: Orthopedic Surgery

## 2019-09-28 VITALS — Ht 64.0 in | Wt 205.0 lb

## 2019-09-28 DIAGNOSIS — Z89511 Acquired absence of right leg below knee: Secondary | ICD-10-CM

## 2019-09-28 NOTE — Progress Notes (Addendum)
Post-Op Visit Note   Patient: Natalie Contreras           Date of Birth: August 20, 1966           MRN: [id number] Visit Date: 09/28/2019 PCP: Celene Squibb, MD  Chief Complaint:  Chief Complaint  Patient presents with  . Right Leg - Routine Post Op    09/03/19 right BKA     HPI:  HPI Patient is a 54 year old woman seen today status post right below knee amputation on 09/02/20. Has been doing quite well.   Patient's current comorbidities are not expected to impact the ability to function with the prescribed prosthesis. Patient verbally communicates a strong desire to use a prosthesis. Patient currently requires mobility aids to ambulate without a prosthesis.  Expects not to use mobility aids with a new prosthesis.  Ortho Exam On examination of the right residual limb. Staples in place. No gaping. Healing well. Minimal scabbing. No active drainage or erythema.  Visit Diagnoses:  1. Status post below-knee amputation of right lower extremity ([place])     Plan: staples harvested without incident. Continue shrinker and current therapy. Will proceed with prosthesis fabrication. Follow up for incisional check in 2 weeks.  Follow-Up Instructions: Return in about 2 weeks (around 10/12/2019).   Imaging: No results found.  Orders:  No orders of the defined types were placed in this encounter.  No orders of the defined types were placed in this encounter.    PMFS History: Patient Active Problem List   Diagnosis Date Noted  . Drug induced constipation   . Postoperative pain   . Morbid obesity ([place])   . Acute blood loss anemia   . Hypoalbuminemia due to protein-calorie malnutrition ([place])   . Stage 3 chronic kidney disease   . Diabetic peripheral neuropathy ([place])   . S/P BKA (below knee amputation) ([place]) 09/06/2019  . Cutaneous abscess of right foot   . Dehiscence of amputation stump ([place])   . 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 Severe protein-calorie malnutrition ([place])   .  Gangrene of right foot ([place])   . Abscess or cellulitis of foot 08/08/2019  . Obesity (BMI 30-39.9) 08/08/2019  . Wound infection 06/13/2019  . Cellulitis 07/17/2017  . Subacute osteomyelitis, right ankle and foot ([place])   . Toe amputation status, left 05/05/2017  . Gangrene of left foot ([place])   . PAD (peripheral artery disease) ([place]) 04/10/2017  . Essential hypertension 04/10/2017  . Chronic diastolic CHF (congestive heart failure) ([place]) 04/10/2017  . AKI (acute kidney injury) ([place]) 04/10/2017  . Foreign body in left foot   . Cellulitis and abscess of foot 04/07/2017  . Carotid stenosis, asymptomatic, bilateral 01/15/2017  . Visual disturbance 01/15/2017  . Tobacco abuse 01/15/2017  . Acute respiratory failure with hypoxia ([place]) 10/30/2016  . Pulmonary hypertension ([place]) 10/29/2016  . Acute diastolic CHF (congestive heart failure) ([place]) 10/28/2016  . Benign essential HTN 10/28/2016  . Hyperlipidemia 10/28/2016  . Diabetic infection of right foot ([place]) 10/28/2016   Past Medical History:  Diagnosis Date  . Abscess of foot 08/09/2019   WITH ULCER  RIGHT FOOT  . Acute diastolic CHF (congestive heart failure) ([place]) 10/28/2016  . Carotid stenosis, asymptomatic, bilateral 01/15/2017  . Diabetes mellitus without complication ([place])   . Heart murmur   . Hypertension   . PAD (peripheral artery disease) ([place]) 04/10/2017   Left ABI 0.74. Right ABI 0.96.    Family History  Problem Relation Age of  Onset  . Lung cancer Mother   . Lung cancer Father   . AAA (abdominal aortic aneurysm) Brother     Past Surgical History:  Procedure Laterality Date  . ABDOMINAL AORTOGRAM W/LOWER EXTREMITY N/A 08/10/2019   Procedure: ABDOMINAL AORTOGRAM W/LOWER EXTREMITY;  Surgeon: Waynetta Sandy, MD;  Location: [facility];  Service: Cardiovascular;  Laterality: N/A;  . AMPUTATION Right 09/03/2019   Procedure: RIGHT BELOW KNEE AMPUTATION;  Surgeon: Newt Minion, MD;  Location: [facility];   Service: Orthopedics;  Laterality: Right;  . AMPUTATION TOE Left 05/05/2017   Procedure: LEFT SECOND TOE AMPUTATION;  Surgeon: Aviva Signs, MD;  Location: [facility];  Service: General;  Laterality: Left;  . CATARACT EXTRACTION W/PHACO Left 02/08/2016   Procedure: CATARACT EXTRACTION PHACO AND INTRAOCULAR LENS PLACEMENT (IOC);  Surgeon: Tonny Branch, MD;  Location: [facility];  Service: Ophthalmology;  Laterality: Left;  CDE 11.43   . CATARACT EXTRACTION W/PHACO Right 02/26/2016   Procedure: CATARACT EXTRACTION PHACO AND INTRAOCULAR LENS PLACEMENT RIGHT; CDE:  16.90;  Surgeon: Tonny Branch, MD;  Location: [facility];  Service: Ophthalmology;  Laterality: Right;  . CHOLECYSTECTOMY    . FOREIGN BODY REMOVAL Left 04/09/2017   Procedure: FOREIGN BODY REMOVAL ADULT FOOT;  Surgeon: Aviva Signs, MD;  Location: [facility];  Service: General;  Laterality: Left;  . I & D EXTREMITY Right 08/11/2019   Procedure: IRRIGATION AND DEBRIDEMENT EXTREMITY;  Surgeon: Newt Minion, MD;  Location: [facility];  Service: Orthopedics;  Laterality: Right;  . PERIPHERAL VASCULAR ATHERECTOMY Right 08/10/2019   Procedure: PERIPHERAL VASCULAR ATHERECTOMY;  Surgeon: Waynetta Sandy, MD;  Location: [facility];  Service: Cardiovascular;  Laterality: Right;  superficial femoral  . PERIPHERAL VASCULAR BALLOON ANGIOPLASTY Right 08/10/2019   Procedure: PERIPHERAL VASCULAR BALLOON ANGIOPLASTY;  Surgeon: Waynetta Sandy, MD;  Location: [facility];  Service: Cardiovascular;  Laterality: Right;  anterior tibial  . TRANSMETATARSAL AMPUTATION Left 07/21/2017   Procedure: TRANSMETATARSAL AMPUTATION LEFT FOOT;  Surgeon: Aviva Signs, MD;  [facility];  Service: General;  Laterality: Left;   Social History   Occupational History  . Not on file  Tobacco Use  . Smoking status: Current Every Day Smoker    Packs/day: 0.50    Years: 25.00    Pack years: 12.50    Types: Cigarettes    Start date: 09/16/1984  . Smokeless  tobacco: Never Used  Substance and Sexual Activity  . Alcohol use: No  . Drug use: No  . Sexual activity: Yes    Birth control/protection: None

## 2019-09-30 ENCOUNTER — Telehealth: Payer: Self-pay | Admitting: Orthopedic Surgery

## 2019-09-30 NOTE — Telephone Encounter (Signed)
Called and sw [facility]N to advise we saw the pt and removed her staples. She can keep the area clean with soap and water apply shrinker direct contact to the skin. To call with any questions.

## 2019-09-30 NOTE — Telephone Encounter (Signed)
Sarah from [facility] called.   She needs to verify the patient's wound care orders.   Call back: [phone]

## 2019-10-08 ENCOUNTER — Other Ambulatory Visit: Payer: Self-pay | Admitting: Physical Medicine and Rehabilitation

## 2019-10-12 ENCOUNTER — Ambulatory Visit: Payer: Medicaid Other | Admitting: Physician Assistant

## 2019-10-14 ENCOUNTER — Telehealth: Payer: Self-pay | Admitting: Specialist

## 2019-10-14 ENCOUNTER — Telehealth: Payer: Self-pay | Admitting: Orthopedic Surgery

## 2019-10-14 NOTE — Telephone Encounter (Signed)
Made in error

## 2019-10-14 NOTE — Telephone Encounter (Signed)
Can you please do the .MVD on prosthesis and add this to note. Thanks

## 2019-10-14 NOTE — Telephone Encounter (Signed)
Patient called.   She said the [facility] needs documentation explaining why the prosthetic was recommended and how it would benefit her. They need it faxed over today.   Patient call back number: [facility] Fax: [phone]

## 2019-10-15 NOTE — Telephone Encounter (Signed)
Natalie Contreras had last note and I cant append her note

## 2019-10-18 ENCOUNTER — Ambulatory Visit (INDEPENDENT_AMBULATORY_CARE_PROVIDER_SITE_OTHER): Payer: Medicaid Other | Admitting: Physician Assistant

## 2019-10-18 ENCOUNTER — Encounter: Payer: Self-pay | Admitting: Physician Assistant

## 2019-10-18 ENCOUNTER — Other Ambulatory Visit: Payer: Self-pay

## 2019-10-18 VITALS — Ht 64.0 in | Wt 205.0 lb

## 2019-10-18 DIAGNOSIS — M86271 Subacute osteomyelitis, right ankle and foot: Secondary | ICD-10-CM

## 2019-10-18 NOTE — Progress Notes (Signed)
Office Visit Note   Patient: Natalie Contreras           Date of Birth: 04-11-1966           MRN: [id number] Visit Date: 10/18/2019              Requested by: Celene Squibb, MD [address] [place],  [place] [address] PCP: Celene Squibb, MD  Chief Complaint  Patient presents with  . Right Leg - Routine Post Op    09/03/2019 right BKA      HPI: This is a pleasant woman who is 6 weeks status post right below-knee amputation.  She is doing quite well and is ready to be fit for her prosthetic  Assessment & Plan: Visit Diagnoses: No diagnosis found.  Plan: She may go forward and have her prosthetic molded she should follow-up in about a month  Follow-Up Instructions: No follow-ups on file.   Ortho Exam  Patient is alert, oriented, no adenopathy, well-dressed, normal affect, normal respiratory effort. Exam of her amputation stump.  The stump is completely healed there is 2 very small areas of eschar which were debrided today.  Swelling is well controlled no cellulitis no drainage  Imaging: No results found. No images are attached to the encounter.  Labs: Lab Results  Component Value Date   HGBA1C 7.0 (H) 09/03/2019   HGBA1C 10.7 (H) 06/13/2019   HGBA1C 7.1 (H) 07/19/2017   ESRSEDRATE 122 (H) 08/08/2019   CRP 11.7 (H) 08/08/2019   REPTSTATUS 09/13/2019 FINAL 09/12/2019   GRAMSTAIN  08/11/2019    FEW WBC PRESENT, PREDOMINANTLY PMN MODERATE GRAM POSITIVE COCCI IN PAIRS RARE GRAM NEGATIVE RODS    CULT MULTIPLE SPECIES PRESENT, SUGGEST RECOLLECTION (A) 09/12/2019   LABORGA ENTEROBACTER AEROGENES 08/11/2019     Lab Results  Component Value Date   ALBUMIN 2.6 (L) 09/07/2019   ALBUMIN 1.7 (L) 08/11/2019   ALBUMIN 1.8 (L) 08/10/2019    Lab Results  Component Value Date   MG 2.1 08/11/2019   MG 2.2 08/10/2019   MG 2.2 03/04/2017   No results found for: VD25OH  No results found for: PREALBUMIN CBC EXTENDED Latest Ref Rng & Units 09/15/2019 09/13/2019  09/10/2019  WBC 4.0 - 10.5 K/uL 6.8 6.8 6.2  RBC 3.87 - 5.11 MIL/uL 3.34(L) 3.47(L) 3.90  HGB 12.0 - 15.0 g/dL 9.0(L) 9.3(L) 10.4(L)  HCT 36.0 - 46.0 % 28.7(L) 30.0(L) 33.8(L)  PLT 150 - 400 K/uL 309 289 275  NEUTROABS 1.7 - 7.7 K/uL - - 3.1  LYMPHSABS 0.7 - 4.0 K/uL - - 2.0     Body mass index is 35.19 kg/m.  Orders:  No orders of the defined types were placed in this encounter.  No orders of the defined types were placed in this encounter.    Procedures: No procedures performed  Clinical Data: No additional findings.  ROS:  All other systems negative, except as noted in the HPI. Review of Systems  Objective: Vital Signs: Ht 5\' 4"  (1.626 m)   Wt 205 lb (93 kg)   LMP 12/16/2015   BMI 35.19 kg/m   Specialty Comments:  No specialty comments available.  PMFS History: Patient Active Problem List   Diagnosis Date Noted  . Drug induced constipation   . Postoperative pain   . Morbid obesity ([place])   . Acute blood loss anemia   . Hypoalbuminemia due to protein-calorie malnutrition ([place])   . Stage 3 chronic kidney disease   .  Diabetic peripheral neuropathy ([place])   . S/P BKA (below knee amputation) ([place]) 09/06/2019  . Cutaneous abscess of right foot   . Dehiscence of amputation stump ([place])   . 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 Severe protein-calorie malnutrition ([place])   . Gangrene of right foot ([place])   . Abscess or cellulitis of foot 08/08/2019  . Obesity (BMI 30-39.9) 08/08/2019  . Wound infection 06/13/2019  . Cellulitis 07/17/2017  . Subacute osteomyelitis, right ankle and foot ([place])   . Toe amputation status, left 05/05/2017  . Gangrene of left foot ([place])   . PAD (peripheral artery disease) ([place]) 04/10/2017  . Essential hypertension 04/10/2017  . Chronic diastolic CHF (congestive heart failure) ([place]) 04/10/2017  . AKI (acute kidney injury) ([place]) 04/10/2017  . Foreign body in left foot   . Cellulitis and abscess of foot 04/07/2017  . Carotid stenosis,  asymptomatic, bilateral 01/15/2017  . Visual disturbance 01/15/2017  . Tobacco abuse 01/15/2017  . Acute respiratory failure with hypoxia ([place]) 10/30/2016  . Pulmonary hypertension ([place]) 10/29/2016  . Acute diastolic CHF (congestive heart failure) ([place]) 10/28/2016  . Benign essential HTN 10/28/2016  . Hyperlipidemia 10/28/2016  . Diabetic infection of right foot ([place]) 10/28/2016   Past Medical History:  Diagnosis Date  . Abscess of foot 08/09/2019   WITH ULCER  RIGHT FOOT  . Acute diastolic CHF (congestive heart failure) ([place]) 10/28/2016  . Carotid stenosis, asymptomatic, bilateral 01/15/2017  . Diabetes mellitus without complication ([place])   . Heart murmur   . Hypertension   . PAD (peripheral artery disease) ([place]) 04/10/2017   Left ABI 0.74. Right ABI 0.96.    Family History  Problem Relation Age of Onset  . Lung cancer Mother   . Lung cancer Father   . AAA (abdominal aortic aneurysm) Brother     Past Surgical History:  Procedure Laterality Date  . ABDOMINAL AORTOGRAM W/LOWER EXTREMITY N/A 08/10/2019   Procedure: ABDOMINAL AORTOGRAM W/LOWER EXTREMITY;  Surgeon: Waynetta Sandy, MD;  Location: [facility];  Service: Cardiovascular;  Laterality: N/A;  . AMPUTATION Right 09/03/2019   Procedure: RIGHT BELOW KNEE AMPUTATION;  Surgeon: Newt Minion, MD;  Location: [facility];  Service: Orthopedics;  Laterality: Right;  . AMPUTATION TOE Left 05/05/2017   Procedure: LEFT SECOND TOE AMPUTATION;  Surgeon: Aviva Signs, MD;  Location: [facility];  Service: General;  Laterality: Left;  . CATARACT EXTRACTION W/PHACO Left 02/08/2016   Procedure: CATARACT EXTRACTION PHACO AND INTRAOCULAR LENS PLACEMENT (IOC);  Surgeon: Tonny Branch, MD;  Location: [facility];  Service: Ophthalmology;  Laterality: Left;  CDE 11.43   . CATARACT EXTRACTION W/PHACO Right 02/26/2016   Procedure: CATARACT EXTRACTION PHACO AND INTRAOCULAR LENS PLACEMENT RIGHT; CDE:  16.90;  Surgeon: Tonny Branch, MD;  Location: [facility];   Service: Ophthalmology;  Laterality: Right;  . CHOLECYSTECTOMY    . FOREIGN BODY REMOVAL Left 04/09/2017   Procedure: FOREIGN BODY REMOVAL ADULT FOOT;  Surgeon: Aviva Signs, MD;  Location: [facility];  Service: General;  Laterality: Left;  . I & D EXTREMITY Right 08/11/2019   Procedure: IRRIGATION AND DEBRIDEMENT EXTREMITY;  Surgeon: Newt Minion, MD;  Location: [facility];  Service: Orthopedics;  Laterality: Right;  . PERIPHERAL VASCULAR ATHERECTOMY Right 08/10/2019   Procedure: PERIPHERAL VASCULAR ATHERECTOMY;  Surgeon: Waynetta Sandy, MD;  Location: [facility];  Service: Cardiovascular;  Laterality: Right;  superficial femoral  . PERIPHERAL VASCULAR BALLOON ANGIOPLASTY Right 08/10/2019   Procedure: PERIPHERAL VASCULAR BALLOON ANGIOPLASTY;  Surgeon: Waynetta Sandy, MD;  Location: [facility];  Service: Cardiovascular;  Laterality: Right;  anterior tibial  . TRANSMETATARSAL AMPUTATION Left 07/21/2017   Procedure: TRANSMETATARSAL AMPUTATION LEFT FOOT;  Surgeon: Aviva Signs, MD;  Location[facility];  Service: General;  Laterality: Left;   Social History   Occupational History  . Not on file  Tobacco Use  . Smoking status: Current Every Day Smoker    Packs/day: 0.50    Years: 25.00    Pack years: 12.50    Types: Cigarettes    Start date: 09/16/1984  . Smokeless tobacco: Never Used  Substance and Sexual Activity  . Alcohol use: No  . Drug use: No  . Sexual activity: Yes    Birth control/protection: None

## 2019-10-20 ENCOUNTER — Other Ambulatory Visit: Payer: Self-pay | Admitting: Physical Medicine and Rehabilitation

## 2019-10-20 ENCOUNTER — Encounter: Payer: Self-pay | Admitting: Surgery

## 2019-11-15 ENCOUNTER — Other Ambulatory Visit: Payer: Self-pay

## 2019-11-15 ENCOUNTER — Ambulatory Visit (INDEPENDENT_AMBULATORY_CARE_PROVIDER_SITE_OTHER): Payer: Medicaid Other | Admitting: Physician Assistant

## 2019-11-15 ENCOUNTER — Encounter: Payer: Self-pay | Admitting: Physician Assistant

## 2019-11-15 DIAGNOSIS — Z89511 Acquired absence of right leg below knee: Secondary | ICD-10-CM

## 2019-11-15 NOTE — Progress Notes (Signed)
Office Visit Note   Patient: Natalie Contreras           Date of Birth: [birth year]           MRN: [id number] Visit Date: 11/15/2019              Requested by: Celene Squibb, MD [address] [place],  [place] [address] PCP: Celene Squibb, MD  Chief Complaint  Patient presents with  . Right Knee - Pain      HPI: This is a pleasant 54 year old woman who is now 10 weeks status post right below-knee amputation.  She is doing quite well and has begun learning how to use her prosthetic.  She has no complaints and is very pleased with her progress  Assessment & Plan: Visit Diagnoses:  1. Status post below-knee amputation of right lower extremity ([place])     Plan: She will begin physical therapy follow-up in 2 months Follow-Up Instructions: No follow-ups on file.   Ortho Exam  Patient is alert, oriented, no adenopathy, well-dressed, normal affect, normal respiratory effort. Focused examination of her amputation demonstrates well-healed surgical incision.  Swelling is well controlled.  No evidence of wound dehiscence or drainage.  No surrounding cellulitis she has full knee range of motion  Imaging: No results found. No images are attached to the encounter.  Labs: Lab Results  Component Value Date   HGBA1C 7.0 (H) 09/03/2019   HGBA1C 10.7 (H) 06/13/2019   HGBA1C 7.1 (H) 07/19/2017   ESRSEDRATE 122 (H) 08/08/2019   CRP 11.7 (H) 08/08/2019   REPTSTATUS 09/13/2019 FINAL 09/12/2019   GRAMSTAIN  08/11/2019    FEW WBC PRESENT, PREDOMINANTLY PMN MODERATE GRAM POSITIVE COCCI IN PAIRS RARE GRAM NEGATIVE RODS    CULT MULTIPLE SPECIES PRESENT, SUGGEST RECOLLECTION (A) 09/12/2019   LABORGA ENTEROBACTER AEROGENES 08/11/2019     Lab Results  Component Value Date   ALBUMIN 2.6 (L) 09/07/2019   ALBUMIN 1.7 (L) 08/11/2019   ALBUMIN 1.8 (L) 08/10/2019    Lab Results  Component Value Date   MG 2.1 08/11/2019   MG 2.2 08/10/2019   MG 2.2 03/04/2017   No results found for:  VD25OH  No results found for: PREALBUMIN CBC EXTENDED Latest Ref Rng & Units 09/15/2019 09/13/2019 09/10/2019  WBC 4.0 - 10.5 K/uL 6.8 6.8 6.2  RBC 3.87 - 5.11 MIL/uL 3.34(L) 3.47(L) 3.90  HGB 12.0 - 15.0 g/dL 9.0(L) 9.3(L) 10.4(L)  HCT 36.0 - 46.0 % 28.7(L) 30.0(L) 33.8(L)  PLT 150 - 400 K/uL 309 289 275  NEUTROABS 1.7 - 7.7 K/uL - - 3.1  LYMPHSABS 0.7 - 4.0 K/uL - - 2.0     There is no height or weight on file to calculate BMI.  Orders:  Orders Placed This Encounter  Procedures  . Ambulatory referral to Physical Therapy   No orders of the defined types were placed in this encounter.    Procedures: No procedures performed  Clinical Data: No additional findings.  ROS:  All other systems negative, except as noted in the HPI. Review of Systems  Objective: Vital Signs: LMP 12/16/2015   Specialty Comments:  No specialty comments available.  PMFS History: Patient Active Problem List   Diagnosis Date Noted  . Drug induced constipation   . Postoperative pain   . Morbid obesity ([place])   . Acute blood loss anemia   . Hypoalbuminemia due to protein-calorie malnutrition ([place])   . Stage 3 chronic kidney disease   . Diabetic  peripheral neuropathy ([place])   . S/P BKA (below knee amputation) ([place]) 09/06/2019  . Cutaneous abscess of right foot   . Dehiscence of amputation stump ([place])   . 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 Severe protein-calorie malnutrition ([place])   . Gangrene of right foot ([place])   . Abscess or cellulitis of foot 08/08/2019  . Obesity (BMI 30-39.9) 08/08/2019  . Wound infection 06/13/2019  . Cellulitis 07/17/2017  . Subacute osteomyelitis, right ankle and foot ([place])   . Toe amputation status, left 05/05/2017  . Gangrene of left foot ([place])   . PAD (peripheral artery disease) ([place]) 04/10/2017  . Essential hypertension 04/10/2017  . Chronic diastolic CHF (congestive heart failure) ([place]) 04/10/2017  . AKI (acute kidney injury) ([place]) 04/10/2017  . Foreign  body in left foot   . Cellulitis and abscess of foot 04/07/2017  . Carotid stenosis, asymptomatic, bilateral 01/15/2017  . Visual disturbance 01/15/2017  . Tobacco abuse 01/15/2017  . Acute respiratory failure with hypoxia ([place]) 10/30/2016  . Pulmonary hypertension ([place]) 10/29/2016  . Acute diastolic CHF (congestive heart failure) ([place]) 10/28/2016  . Benign essential HTN 10/28/2016  . Hyperlipidemia 10/28/2016  . Diabetic infection of right foot ([place]) 10/28/2016   Past Medical History:  Diagnosis Date  . Abscess of foot 08/09/2019   WITH ULCER  RIGHT FOOT  . Acute diastolic CHF (congestive heart failure) ([place]) 10/28/2016  . Carotid stenosis, asymptomatic, bilateral 01/15/2017  . Diabetes mellitus without complication ([place])   . Heart murmur   . Hypertension   . PAD (peripheral artery disease) ([place]) 04/10/2017   Left ABI 0.74. Right ABI 0.96.    Family History  Problem Relation Age of Onset  . Lung cancer Mother   . Lung cancer Father   . AAA (abdominal aortic aneurysm) Brother     Past Surgical History:  Procedure Laterality Date  . ABDOMINAL AORTOGRAM W/LOWER EXTREMITY N/A 08/10/2019   Procedure: ABDOMINAL AORTOGRAM W/LOWER EXTREMITY;  Surgeon: Waynetta Sandy, MD;  Location: [facility];  Service: Cardiovascular;  Laterality: N/A;  . AMPUTATION Right 09/03/2019   Procedure: RIGHT BELOW KNEE AMPUTATION;  Surgeon: Newt Minion, MD;  Location: [facility];  Service: Orthopedics;  Laterality: Right;  . AMPUTATION TOE Left 05/05/2017   Procedure: LEFT SECOND TOE AMPUTATION;  Surgeon: Aviva Signs, MD;  [facility];  Service: General;  Laterality: Left;  . CATARACT EXTRACTION W/PHACO Left 02/08/2016   Procedure: CATARACT EXTRACTION PHACO AND INTRAOCULAR LENS PLACEMENT (IOC);  Surgeon: Tonny Branch, MD;  [facility];  Service: Ophthalmology;  Laterality: Left;  CDE 11.43   . CATARACT EXTRACTION W/PHACO Right 02/26/2016   Procedure: CATARACT EXTRACTION PHACO AND  INTRAOCULAR LENS PLACEMENT RIGHT; CDE:  16.90;  Surgeon: Tonny Branch, MD;  [facility];  Service: Ophthalmology;  Laterality: Right;  . CHOLECYSTECTOMY    . FOREIGN BODY REMOVAL Left 04/09/2017   Procedure: FOREIGN BODY REMOVAL ADULT FOOT;  Surgeon: Aviva Signs, MD;  [facility];  Service: General;  Laterality: Left;  . I & D EXTREMITY Right 08/11/2019   Procedure: IRRIGATION AND DEBRIDEMENT EXTREMITY;  Surgeon: Newt Minion, MD;  Location: [facility];  Service: Orthopedics;  Laterality: Right;  . PERIPHERAL VASCULAR ATHERECTOMY Right 08/10/2019   Procedure: PERIPHERAL VASCULAR ATHERECTOMY;  Surgeon: Waynetta Sandy, MD;  Location: [facility];  Service: Cardiovascular;  Laterality: Right;  superficial femoral  . PERIPHERAL VASCULAR BALLOON ANGIOPLASTY Right 08/10/2019   Procedure: PERIPHERAL VASCULAR BALLOON ANGIOPLASTY;  Surgeon:  Waynetta Sandy, MD;  Location: [facility];  Service: Cardiovascular;  Laterality: Right;  anterior tibial  . TRANSMETATARSAL AMPUTATION Left 07/21/2017   Procedure: TRANSMETATARSAL AMPUTATION LEFT FOOT;  Surgeon: Aviva Signs, MD;  [facility];  Service: General;  Laterality: Left;   Social History   Occupational History  . Not on file  Tobacco Use  . Smoking status: Current Every Day Smoker    Packs/day: 0.50    Years: 25.00    Pack years: 12.50    Types: Cigarettes    Start date: 09/16/1984  . Smokeless tobacco: Never Used  Substance and Sexual Activity  . Alcohol use: No  . Drug use: No  . Sexual activity: Yes    Birth control/protection: None

## 2019-12-15 ENCOUNTER — Other Ambulatory Visit: Payer: Self-pay | Admitting: Physical Medicine & Rehabilitation

## 2019-12-17 ENCOUNTER — Other Ambulatory Visit: Payer: Self-pay | Admitting: Physical Medicine & Rehabilitation

## 2020-01-10 ENCOUNTER — Telehealth: Payer: Self-pay | Admitting: Physician Assistant

## 2020-01-10 ENCOUNTER — Other Ambulatory Visit: Payer: Self-pay

## 2020-01-10 DIAGNOSIS — Z89511 Acquired absence of right leg below knee: Secondary | ICD-10-CM

## 2020-01-10 NOTE — Telephone Encounter (Signed)
Patient called requesting a referral to [facility].  CB#[phone].  Thank you.

## 2020-01-10 NOTE — Telephone Encounter (Signed)
Pt is s/p a BKA and order for physical therapy sent per pt request to [facility] for prosthetic gait training.

## 2020-01-14 ENCOUNTER — Ambulatory Visit: Payer: Medicaid Other

## 2020-01-17 ENCOUNTER — Ambulatory Visit: Payer: Medicaid Other | Admitting: Physician Assistant

## 2020-01-19 ENCOUNTER — Encounter (HOSPITAL_COMMUNITY): Payer: Self-pay | Admitting: Physical Therapy

## 2020-01-19 ENCOUNTER — Other Ambulatory Visit: Payer: Self-pay

## 2020-01-19 ENCOUNTER — Ambulatory Visit (HOSPITAL_COMMUNITY): Payer: Medicaid Other | Attending: Orthopedic Surgery | Admitting: Physical Therapy

## 2020-01-19 DIAGNOSIS — R2689 Other abnormalities of gait and mobility: Secondary | ICD-10-CM | POA: Diagnosis present

## 2020-01-19 DIAGNOSIS — Z89519 Acquired absence of unspecified leg below knee: Secondary | ICD-10-CM | POA: Diagnosis present

## 2020-01-19 DIAGNOSIS — M6281 Muscle weakness (generalized): Secondary | ICD-10-CM | POA: Diagnosis present

## 2020-01-19 NOTE — Therapy (Signed)
[place], [place], [address] Phone: [phone]   Fax:  [phone]  Physical Therapy Evaluation  Patient Details  Name: Natalie Contreras MRN: [id number] Date of Birth: May 30, 1966 Referring Provider (PT): Meridee Score MD   Encounter Date: 01/19/2020  PT End of Session - 01/19/20 0907    Visit Number  1    Number of Visits  16    Date for PT Re-Evaluation  03/17/20    Authorization Type  Medicaid (submitted for 3 visits on 01/19/20)    Authorization - Visit Number  1    Authorization - Number of Visits  1    Progress Note Due on Visit  4    PT Start Time  0820    PT Stop Time  0905    PT Time Calculation (min)  45 min    Equipment Utilized During Treatment  Gait belt    Activity Tolerance  Patient tolerated treatment well;Patient limited by fatigue    Behavior During Therapy  [facility] for tasks assessed/performed       Past Medical History:  Diagnosis Date  . Abscess of foot 08/09/2019   WITH ULCER  RIGHT FOOT  . Acute diastolic CHF (congestive heart failure) ([place]) 10/28/2016  . Carotid stenosis, asymptomatic, bilateral 01/15/2017  . Diabetes mellitus without complication ([place])   . Heart murmur   . Hypertension   . PAD (peripheral artery disease) ([place]) 04/10/2017   Left ABI 0.74. Right ABI 0.96.    Past Surgical History:  Procedure Laterality Date  . ABDOMINAL AORTOGRAM W/LOWER EXTREMITY N/A 08/10/2019   Procedure: ABDOMINAL AORTOGRAM W/LOWER EXTREMITY;  Surgeon: Waynetta Sandy, MD;  Location: [facility];  Service: Cardiovascular;  Laterality: N/A;  . AMPUTATION Right 09/03/2019   Procedure: RIGHT BELOW KNEE AMPUTATION;  Surgeon: Newt Minion, MD;  Location: [facility];  Service: Orthopedics;  Laterality: Right;  . AMPUTATION TOE Left 05/05/2017   Procedure: LEFT SECOND TOE AMPUTATION;  Surgeon: Aviva Signs, MD;  Location: [facility];  Service: General;  Laterality: Left;  . CATARACT EXTRACTION  W/PHACO Left 02/08/2016   Procedure: CATARACT EXTRACTION PHACO AND INTRAOCULAR LENS PLACEMENT (IOC);  Surgeon: Tonny Branch, MD;  Location: [facility];  Service: Ophthalmology;  Laterality: Left;  CDE 11.43   . CATARACT EXTRACTION W/PHACO Right 02/26/2016   Procedure: CATARACT EXTRACTION PHACO AND INTRAOCULAR LENS PLACEMENT RIGHT; CDE:  16.90;  Surgeon: Tonny Branch, MD;  Location: [facility];  Service: Ophthalmology;  Laterality: Right;  . CHOLECYSTECTOMY    . FOREIGN BODY REMOVAL Left 04/09/2017   Procedure: FOREIGN BODY REMOVAL ADULT FOOT;  Surgeon: Aviva Signs, MD;  Location: [facility];  Service: General;  Laterality: Left;  . I & D EXTREMITY Right 08/11/2019   Procedure: IRRIGATION AND DEBRIDEMENT EXTREMITY;  Surgeon: Newt Minion, MD;  Location: [facility];  Service: Orthopedics;  Laterality: Right;  . PERIPHERAL VASCULAR ATHERECTOMY Right 08/10/2019   Procedure: PERIPHERAL VASCULAR ATHERECTOMY;  Surgeon: Waynetta Sandy, MD;  Location: [facility];  Service: Cardiovascular;  Laterality: Right;  superficial femoral  . PERIPHERAL VASCULAR BALLOON ANGIOPLASTY Right 08/10/2019   Procedure: PERIPHERAL VASCULAR BALLOON ANGIOPLASTY;  Surgeon: Waynetta Sandy, MD;  Location: [facility];  Service: Cardiovascular;  Laterality: Right;  anterior tibial  . TRANSMETATARSAL AMPUTATION Left 07/21/2017   Procedure: TRANSMETATARSAL AMPUTATION LEFT FOOT;  Surgeon: Aviva Signs, MD;  Location: [facility];  Service: General;  Laterality: Left;    There  were no vitals filed for this visit.   Subjective Assessment - 01/19/20 0828    Subjective  Patient presents to physical therapy with gait and balance disturbance s/p RT BKA on 09/03/19. Patient had 2 weeks impatient therapy, while she was being fit for initial prosthetic. Patient recently received new prosthetic in March and has began walking but has not had formal training with this. Patient says she can walk but needs support or something to  hold onto. Patient currently ambulating using RW. Says she feels her balance is not good, but has always been a problem. Reports history of diabetic neuropathy which makes it difficult to feel her foot and increases balance difficulty. Also notes that she had some injury on her other foot when she stepped on a piece of glass, resulting in having all but great toe of LT foot amputated. Says she has a filler and foot plate on that side, but this also contributes to balance difficulty. Denies pain.    Pertinent History  CHF, Diabetes    Limitations  Lifting;Standing;Walking;House hold activities    Patient Stated Goals  Just want to walk without assistance    Currently in Pain?  No/denies         [facility] PT Assessment - 01/19/20 0001      Assessment   Medical Diagnosis  RT BKA    Referring Provider (PT)  Meridee Score MD    Onset Date/Surgical Date  09/03/19    Next MD Visit  --   Not yet scheduled    Prior Therapy  Yes, 2 weeks IP      Precautions   Precautions  Fall      Restrictions   Weight Bearing Restrictions  No      Balance Screen   Has the patient fallen in the past 6 months  No    Has the patient had a decrease in activity level because of a fear of falling?   No    Is the patient reluctant to leave their home because of a fear of falling?   Yes      [place]  Private residence    Living Arrangements  Children    Available Help at Discharge  Family      Prior Function   Level of Grandfather with basic ADLs      Observation/Other Assessments   Observations  Noted edema about LLE, educated patient on possible beneift of compression garment       Sensation   Light Touch  Impaired by gross assessment   Decreased sensation to light touch about LLE      ROM / Strength   AROM / PROM / Strength  Strength      Strength   Strength Assessment Site  Hip;Knee    Right/Left Hip  Right;Left    Right Hip Flexion  4+/5    Right Hip  Extension  4-/5    Right Hip ABduction  4-/5    Left Hip Flexion  3+/5    Left Hip Extension  3-/5    Left Hip ABduction  4/5    Right/Left Knee  Right;Left    Right Knee Flexion  4+/5    Right Knee Extension  5/5    Left Knee Flexion  4+/5    Left Knee Extension  4+/5      Transfers   Five time sit to stand comments   18 sec with UEs, poor control on  descent       Ambulation/Gait   Ambulation/Gait  Yes    Ambulation/Gait Assistance  6: Modified independent (Device/Increase time);5: Supervision    Ambulation Distance (Feet)  175 Feet    Assistive device  Rolling walker    Gait Pattern  Decreased step length - right;Decreased step length - left;Decreased stride length;Trunk flexed;Decreased dorsiflexion - right    Ambulation Surface  Level;Indoor    Gait Comments  2MWT      Standardized Balance Assessment   Standardized Balance Assessment  Berg Balance Test      Berg Balance Test   Sit to Stand  Able to stand  independently using hands    Standing Unsupported  Able to stand 2 minutes with supervision    Sitting with Back Unsupported but Feet Supported on Floor or Stool  Able to sit safely and securely 2 minutes    Stand to Sit  Sits safely with minimal use of hands    Transfers  Able to transfer safely, definite need of hands    Standing Unsupported with Eyes Closed  Unable to keep eyes closed 3 seconds but stays steady    Standing Unsupported with Feet Together  Able to place feet together independently but unable to hold for 30 seconds    From Standing, Reach Forward with Outstretched Arm  Can reach forward >5 cm safely (2")    From Standing Position, Pick up Object from Floor  Unable to pick up and needs supervision    From Standing Position, Turn to Look Behind Over each Shoulder  Needs assist to keep from losing balance and falling    Turn 360 Degrees  Needs assistance while turning    Standing Unsupported, Alternately Place Feet on Step/Stool  Needs assistance to keep from  falling or unable to try    Standing Unsupported, One Foot in Front  Needs help to step but can hold 15 seconds    Standing on One Leg  Unable to try or needs assist to prevent fall    Total Score  24                Objective measurements completed on examination: See above findings.              PT Education - 01/19/20 0828    Education Details  On evaluation findings and POC    Person(s) Educated  Patient    Methods  Explanation    Comprehension  Verbalized understanding       PT Short Term Goals - 01/19/20 0920      PT SHORT TERM GOAL #1   Title  Patient will be independent with initial HEP and self-management strategies to improve functional outcomes    Time  4    Period  Weeks    Status  New    Target Date  02/18/20      PT SHORT TERM GOAL #2   Title  Patient will be able to perform stand x 5 in < 15 seconds to demonstrate improvement in functional mobility and reduced risk for falls.    Time  4    Period  Weeks    Status  New    Target Date  02/18/20        PT Long Term Goals - 01/19/20 [id number]      PT LONG TERM GOAL #1   Title  Patient will have equal to or > 4/5 MMT throughout BLE to improve ability to perform  functional mobility, stair ambulation and ADLs.    Time  8    Period  Weeks    Status  New    Target Date  03/17/20      PT LONG TERM GOAL #2   Title  Patient will be able to ambulate at least 250 feet during 2MWT with LRAD to demonstrate improved ability to perform functional mobility and associated tasks.    Time  8    Period  Weeks    Status  New    Target Date  03/17/20      PT LONG TERM GOAL #3   Title  Patient will improve BERG score to >44 to indicate improvement in functional outcomes and reduced risk for falls    Time  8    Period  Weeks    Status  New    Target Date  03/17/20             Plan - 01/19/20 0908    Clinical Impression Statement  Patient is a 54 y.o. female who presents to physical therapy with  complaint of balance difficulty s/p RT BKA on 09/03/19. Patient demonstrates decreased strength, balance deficits and gait abnormalities which are negatively impacting patient ability to perform ADLs and functional mobility tasks. Patient will benefit from skilled physical therapy services to address these deficits to improve level of function with ADLs, functional mobility tasks, and reduce risk for falls.    Personal Factors and Comorbidities  Comorbidity 3+    Comorbidities  CHF, diabetes, BKA    Examination-Activity Limitations  Bathing;Lift;Stand;Bed Mobility;Locomotion Level;Bend;Transfers;Toileting;Reach Overhead;Caring for Others;Carry;Dressing;Squat;Hygiene/Grooming;Stairs    Examination-Participation Restrictions  Laundry;Community Activity;Cleaning;Meal Prep;Yard Work    Stability/Clinical Decision Making  Stable/Uncomplicated    Designer, jewellery  Low    Rehab Potential  Good    PT Frequency  2x / week    PT Duration  8 weeks    PT Treatment/Interventions  ADLs/Self Care Home Management;Aquatic Therapy;Biofeedback;Cryotherapy;Electrical Stimulation;Contrast Bath;Therapeutic exercise;Orthotic Fit/Training;Compression bandaging;Taping;Vasopneumatic Device;Joint Manipulations;Spinal Manipulations;Scar mobilization;Passive range of motion;Prosthetic Training;Wheelchair mobility training;Balance training;DME Instruction;Iontophoresis 4mg /ml Dexamethasone;Neuromuscular re-education;Gait training;Stair training;Moist Heat;Traction;Functional mobility training;Cognitive remediation;Manual techniques;Dry needling;Energy conservation;Manual lymph drainage;Splinting;Patient/family education;Therapeutic activities;Ultrasound;Parrafin;Fluidtherapy    PT Next Visit Plan  Review goals, issue HEP. HEP to consist of SLR, bridge, ab march. Progress LE strength and balance as tolerated. Progress to standing static balance when able.    PT Home Exercise Plan  issue at next visit    Consulted and Agree  with Plan of Care  Patient       Patient will benefit from skilled therapeutic intervention in order to improve the following deficits and impairments:  Abnormal gait, Improper body mechanics, Impaired sensation, Decreased activity tolerance, Decreased endurance, Decreased range of motion, Decreased strength, Hypomobility, Decreased balance, Difficulty walking, Increased edema, Prosthetic Dependency  Visit Diagnosis: Other abnormalities of gait and mobility  Muscle weakness (generalized)     Problem List Patient Active Problem List   Diagnosis Date Noted  . Drug induced constipation   . Postoperative pain   . Morbid obesity ([place])   . Acute blood loss anemia   . Hypoalbuminemia due to protein-calorie malnutrition ([place])   . Stage 3 chronic kidney disease   . Diabetic peripheral neuropathy ([place])   . S/P BKA (below knee amputation) ([place]) 09/06/2019  . Cutaneous abscess of right foot   . Dehiscence of amputation stump ([place])   . 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 Severe protein-calorie malnutrition ([place])   . Gangrene of right foot (  [place])   . Abscess or cellulitis of foot 08/08/2019  . Obesity (BMI 30-39.9) 08/08/2019  . Wound infection 06/13/2019  . Cellulitis 07/17/2017  . Subacute osteomyelitis, right ankle and foot ([place])   . Toe amputation status, left 05/05/2017  . Gangrene of left foot ([place])   . PAD (peripheral artery disease) ([place]) 04/10/2017  . Essential hypertension 04/10/2017  . Chronic diastolic CHF (congestive heart failure) ([place]) 04/10/2017  . AKI (acute kidney injury) ([place]) 04/10/2017  . Foreign body in left foot   . Cellulitis and abscess of foot 04/07/2017  . Carotid stenosis, asymptomatic, bilateral 01/15/2017  . Visual disturbance 01/15/2017  . Tobacco abuse 01/15/2017  . Acute respiratory failure with hypoxia ([place]) 10/30/2016  . Pulmonary hypertension ([place]) 10/29/2016  . Acute diastolic CHF (congestive heart failure) ([place]) 10/28/2016  . Benign essential  HTN 10/28/2016  . Hyperlipidemia 10/28/2016  . Diabetic infection of right foot ([place]) 10/28/2016    9:26 AM, 01/19/20 Josue Hector PT DPT  Physical Therapist with [facility]  [phone] [place], [place], [address] Phone: [phone]   Fax:  [phone]  Name: Natalie Contreras MRN: [id number] Date of Birth: March 12, 1966

## 2020-01-26 ENCOUNTER — Other Ambulatory Visit: Payer: Self-pay

## 2020-01-26 ENCOUNTER — Telehealth (HOSPITAL_COMMUNITY): Payer: Self-pay

## 2020-01-26 ENCOUNTER — Ambulatory Visit (HOSPITAL_COMMUNITY): Payer: Medicaid Other | Admitting: Physical Therapy

## 2020-01-26 DIAGNOSIS — R2689 Other abnormalities of gait and mobility: Secondary | ICD-10-CM

## 2020-01-26 DIAGNOSIS — Z89519 Acquired absence of unspecified leg below knee: Secondary | ICD-10-CM

## 2020-01-26 DIAGNOSIS — M6281 Muscle weakness (generalized): Secondary | ICD-10-CM

## 2020-01-26 NOTE — Patient Instructions (Signed)
Prepared By: Melbeta  [place], [place]  Phone: [phone]    Seated Transversus Abdominis Bracing SETS: 1 REPS: 10 HOLD: 5 SEC DAILY: 2 WEEKLY: 7 Exercise image step1 Setup Begin sitting in an upright position with your hands on your lower abdominals. Movement Slowly draw your navel in toward your spine, bracing your deep abdominal muscles. Hold, then relax and repeat. Tip Make sure to sit tall throughout the exercise. Avoid bending your trunk forward and do not hold your breath. Disclaimer: This program provides exercises related to your condition that you can perform at home. As there is a risk of injury with any activity, use caution when performing exercises. If you experience any pain or discomfort, discontinue the exercises and contact your health care provider. Login URL: [email or web address]  .  Access Code: [email or web address] .   Date printed: 05/12/2021Page 2 Seated Long Arc Quad SETS: 2 REPS: 10 HOLD: 5 SEC DAILY: 2 WEEKLY: 7 Exercise image step1 Exercise image step2 Setup Begin sitting upright in a chair. Movement Slowly straighten one knee so that your leg is straight out in front of you. Hold, and then return to starting position and repeat. Tip Make sure to keep your back straight during the exercise. Disclaimer: This program provides exercises related to your condition that you can perform at home. As there is a risk of injury with any activity, use caution when performing exercises. If you experience any pain or discomfort, discontinue the exercises and contact your health care provider. Login URL: [email or web address]  .  Access Code: [place] .   Date printed: 05/12/2021Page 3 Sit to Stand SETS: 1 REPS: 10 DAILY: 1 WEEKLY: 7 Exercise image step1 Exercise image step2 Exercise image step3 Setup Begin sitting upright with your feet flat on the ground underneath your knees. Movement Move your shoulders and head over your toes, bring  your knees forward, and allow your hips to come off the chair, then push down equally into both feet to stand up. Sit back down and repeat. Tip Make sure to keep your weight evenly distributed between both legs, and try to keep your back straight throughout the exercise. Do not lock out your knees once you are standing. Disclaimer: This program provides exercises related to your condition that you can perform at home. As there is a risk of injury with any activity, use caution when performing exercises. If you experience any pain or discomfort, discontinue the exercises and contact your health care provider. Login URL: [email or web address]  .  Access Code: [email or web address] .   Date printed: 05/12/2021Page 4 Single Leg Stance with Support SETS: 2 REPS: 3 HOLD: 30 SEC DAILY: 1 WEEKLY: 7 Exercise image step1 Exercise image step2 Setup Begin in a standing upright position holding on to a stable object for support. Movement Lift one foot off the floor and hold this position. Tip Make sure to maintain your balance during the exercise.

## 2020-01-26 NOTE — Telephone Encounter (Signed)
Pt cancelled appt for 5/13 because she does not have transportation

## 2020-01-26 NOTE — Therapy (Signed)
[place], [place], [address] Phone: [phone]   Fax:  [phone]  Physical Therapy Treatment  Patient Details  Name: Natalie Contreras MRN: [id number] Date of Birth: 1966-08-21 Referring Provider (PT): Meridee Score MD   Encounter Date: 01/26/2020  PT End of Session - 01/26/20 1150    Visit Number  2    Number of Visits  16    Date for PT Re-Evaluation  03/17/20    Authorization Type  Medicaid 3 visits approved    Authorization Time Period  3 visits 5/10-5/23    Authorization - Visit Number  1    Authorization - Number of Visits  3    Progress Note Due on Visit  4    PT Start Time  1005    PT Stop Time  1050    PT Time Calculation (min)  45 min    Equipment Utilized During Treatment  Gait belt    Activity Tolerance  Patient tolerated treatment well;Patient limited by fatigue    Behavior During Therapy  [facility] for tasks assessed/performed       Past Medical History:  Diagnosis Date  . Abscess of foot 08/09/2019   WITH ULCER  RIGHT FOOT  . Acute diastolic CHF (congestive heart failure) ([place]) 10/28/2016  . Carotid stenosis, asymptomatic, bilateral 01/15/2017  . Diabetes mellitus without complication ([place])   . Heart murmur   . Hypertension   . PAD (peripheral artery disease) ([place]) 04/10/2017   Left ABI 0.74. Right ABI 0.96.    Past Surgical History:  Procedure Laterality Date  . ABDOMINAL AORTOGRAM W/LOWER EXTREMITY N/A 08/10/2019   Procedure: ABDOMINAL AORTOGRAM W/LOWER EXTREMITY;  Surgeon: Waynetta Sandy, MD;  Location: [facility];  Service: Cardiovascular;  Laterality: N/A;  . AMPUTATION Right 09/03/2019   Procedure: RIGHT BELOW KNEE AMPUTATION;  Surgeon: Newt Minion, MD;  Location: [facility];  Service: Orthopedics;  Laterality: Right;  . AMPUTATION TOE Left 05/05/2017   Procedure: LEFT SECOND TOE AMPUTATION;  Surgeon: Aviva Signs, MD;  Location: [facility];  Service: General;  Laterality:  Left;  . CATARACT EXTRACTION W/PHACO Left 02/08/2016   Procedure: CATARACT EXTRACTION PHACO AND INTRAOCULAR LENS PLACEMENT (IOC);  Surgeon: Tonny Branch, MD;  Location: [facility];  Service: Ophthalmology;  Laterality: Left;  CDE 11.43   . CATARACT EXTRACTION W/PHACO Right 02/26/2016   Procedure: CATARACT EXTRACTION PHACO AND INTRAOCULAR LENS PLACEMENT RIGHT; CDE:  16.90;  Surgeon: Tonny Branch, MD;  Location: [facility];  Service: Ophthalmology;  Laterality: Right;  . CHOLECYSTECTOMY    . FOREIGN BODY REMOVAL Left 04/09/2017   Procedure: FOREIGN BODY REMOVAL ADULT FOOT;  Surgeon: Aviva Signs, MD;  Location: [facility];  Service: General;  Laterality: Left;  . I & D EXTREMITY Right 08/11/2019   Procedure: IRRIGATION AND DEBRIDEMENT EXTREMITY;  Surgeon: Newt Minion, MD;  Location: [facility];  Service: Orthopedics;  Laterality: Right;  . PERIPHERAL VASCULAR ATHERECTOMY Right 08/10/2019   Procedure: PERIPHERAL VASCULAR ATHERECTOMY;  Surgeon: Waynetta Sandy, MD;  Location: [place] [facility];  Service: Cardiovascular;  Laterality: Right;  superficial femoral  . PERIPHERAL VASCULAR BALLOON ANGIOPLASTY Right 08/10/2019   Procedure: PERIPHERAL VASCULAR BALLOON ANGIOPLASTY;  Surgeon: Waynetta Sandy, MD;  Location: [facility] [facility];  Service: Cardiovascular;  Laterality: Right;  anterior tibial  . TRANSMETATARSAL AMPUTATION Left 07/21/2017   Procedure: TRANSMETATARSAL AMPUTATION LEFT FOOT;  Surgeon: Aviva Signs, MD;  [facility];  Service: General;  Laterality: Left;    There were no vitals filed for this visit.  Subjective Assessment - 01/26/20 1013    Subjective  Pt returns today without any issues or pain.  Not wearing compression on lt LE today and noted swelling.    Currently in Pain?  No/denies                        [facility] Adult PT Treatment/Exercise - 01/26/20 0001      Knee/Hip Exercises: Standing   SLS  bilateral with 1 HHA 15-20 seconds    Gait Training   with RW 226 feet, 5 steps with HW (unable to use SPC at this time)      Knee/Hip Exercises: Seated   Long Arc Quad  Both;10 reps;Limitations    Long Arc Quad Limitations  5 second holds    Other Seated Knee/Hip Exercises  abdominal bracing 10X5" holds    Sit to [employer]  5 reps;without UE support   and not pushing legs against chair     Knee/Hip Exercises: Supine   Bridges  Both;10 reps    Straight Leg Raises  Both;10 reps             PT Education - 01/26/20 1149    Education Details  educated on importance of compression garments and how this manages lymph/prevents wounds.  Postural education, goal review, POC moving forward and initiation of HEP.    Person(s) Educated  Patient    Methods  Explanation;Demonstration;Tactile cues;Verbal cues;Handout    Comprehension  Verbalized understanding;Returned demonstration;Verbal cues required;Tactile cues required;Need further instruction       PT Short Term Goals - 01/26/20 1027      PT SHORT TERM GOAL #1   Title  Patient will be independent with initial HEP and self-management strategies to improve functional outcomes    Time  4    Period  Weeks    Status  On-going    Target Date  02/18/20      PT SHORT TERM GOAL #2   Title  Patient will be able to perform stand x 5 in < 15 seconds to demonstrate improvement in functional mobility and reduced risk for falls.    Time  4    Period  Weeks    Status  On-going    Target Date  02/18/20        PT Long Term Goals - 01/26/20 1027      PT LONG TERM GOAL #1   Title  Patient will have equal to or > 4/5 MMT throughout BLE to improve ability to perform functional mobility, stair ambulation and ADLs.    Time  8    Period  Weeks    Status  On-going      PT LONG TERM GOAL #2   Title  Patient will be able to ambulate at least 250 feet during 2MWT with LRAD to demonstrate improved ability to perform functional mobility and associated tasks.    Time  8    Period  Weeks    Status   On-going      PT LONG TERM GOAL #3   Title  Patient will improve BERG score to >44 to indicate improvement in functional outcomes and reduced risk for falls    Time  8    Period  Weeks    Status  On-going            Plan - 01/26/20 1208  Clinical Impression Statement  Reviewed goals and POC moving forward.  Initiated HEP to include exercises in seated and standing as patient does not have a bed (sleeps in recliner).  Encouraged to get a bed, however pt lives in a small apartment and does not have the room for one.  Educated on importance of compression stockings and how they prevent wounds and cellulitis.  Pt to wear next session and therapist will determine if they are high enough compression and fit of garment.  Pt requires max cues for forward gaze and posturing with gait using RW.  Attempted SPC, however pt unable at this point due to instabiliy and fear.  Several steps were taken with hemiwalker, however.  Pt with no further questions or issues at EOS.    Personal Factors and Comorbidities  Comorbidity 3+    Comorbidities  CHF, diabetes, BKA    Examination-Activity Limitations  Bathing;Lift;Stand;Bed Mobility;Locomotion Level;Bend;Transfers;Toileting;Reach Overhead;Caring for Others;Carry;Dressing;Squat;Hygiene/Grooming;Stairs    Examination-Participation Restrictions  Laundry;Community Activity;Cleaning;Meal Prep;Yard Work    Stability/Clinical Decision Making  Stable/Uncomplicated    Rehab Potential  Good    PT Frequency  2x / week    PT Duration  8 weeks    PT Treatment/Interventions  ADLs/Self Care Home Management;Aquatic Therapy;Biofeedback;Cryotherapy;Electrical Stimulation;Contrast Bath;Therapeutic exercise;Orthotic Fit/Training;Compression bandaging;Taping;Vasopneumatic Device;Joint Manipulations;Spinal Manipulations;Scar mobilization;Passive range of motion;Prosthetic Training;Wheelchair mobility training;Balance training;DME Instruction;Iontophoresis 4mg /ml  Dexamethasone;Neuromuscular re-education;Gait training;Stair training;Moist Heat;Traction;Functional mobility training;Cognitive remediation;Manual techniques;Dry needling;Energy conservation;Manual lymph drainage;Splinting;Patient/family education;Therapeutic activities;Ultrasound;Parrafin;Fluidtherapy    PT Next Visit Plan  Progress LE strength and balance as tolerated. Progress to standing static balance as able and gait with LRAD.    PT Home Exercise Plan  01/26/20:  single leg stance in walker beginning with 1 UE assist, sit to stands without UE, seated abdominal bracing, seated LAQ.    Consulted and Agree with Plan of Care  Patient       Patient will benefit from skilled therapeutic intervention in order to improve the following deficits and impairments:  Abnormal gait, Improper body mechanics, Impaired sensation, Decreased activity tolerance, Decreased endurance, Decreased range of motion, Decreased strength, Hypomobility, Decreased balance, Difficulty walking, Increased edema, Prosthetic Dependency  Visit Diagnosis: Other abnormalities of gait and mobility  Muscle weakness (generalized)  Status post below knee amputation, unspecified laterality [facility])     Problem List Patient Active Problem List   Diagnosis Date Noted  . Drug induced constipation   . Postoperative pain   . Morbid obesity ([place])   . Acute blood loss anemia   . Hypoalbuminemia due to protein-calorie malnutrition ([place])   . Stage 3 chronic kidney disease   . Diabetic peripheral neuropathy ([place])   . S/P BKA (below knee amputation) ([place]) 09/06/2019  . Cutaneous abscess of right foot   . Dehiscence of amputation stump ([place])   . 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 Severe protein-calorie malnutrition ([place])   . Gangrene of right foot ([place])   . Abscess or cellulitis of foot 08/08/2019  . Obesity (BMI 30-39.9) 08/08/2019  . Wound infection 06/13/2019  . Cellulitis 07/17/2017  . Subacute osteomyelitis, right ankle and  foot ([place])   . Toe amputation status, left 05/05/2017  . Gangrene of left foot ([place])   . PAD (peripheral artery disease) ([place]) 04/10/2017  . Essential hypertension 04/10/2017  . Chronic diastolic CHF (congestive heart failure) ([place]) 04/10/2017  . AKI (acute kidney injury) ([place]) 04/10/2017  . Foreign body in left foot   . Cellulitis and abscess of foot 04/07/2017  . Carotid stenosis, asymptomatic, bilateral  01/15/2017  . Visual disturbance 01/15/2017  . Tobacco abuse 01/15/2017  . Acute respiratory failure with hypoxia ([place]) 10/30/2016  . Pulmonary hypertension ([place]) 10/29/2016  . Acute diastolic CHF (congestive heart failure) ([place]) 10/28/2016  . Benign essential HTN 10/28/2016  . Hyperlipidemia 10/28/2016  . Diabetic infection of right foot ([place]) 10/28/2016   Teena Irani, PTA/CLT [phone]  Teena Irani 01/26/2020, 12:15 PM  [place] [address] [place], [place], [address] Phone: [phone]   Fax:  [phone]  Name: Natalie Contreras MRN: [id number] Date of Birth: 28-Aug-1966

## 2020-01-27 ENCOUNTER — Telehealth (HOSPITAL_COMMUNITY): Payer: Self-pay | Admitting: Physical Therapy

## 2020-01-27 ENCOUNTER — Ambulatory Visit (HOSPITAL_COMMUNITY): Payer: Medicaid Other

## 2020-01-27 NOTE — Telephone Encounter (Signed)
pt cancelled appt for 5/14 because she does not have a way to get here

## 2020-01-28 ENCOUNTER — Ambulatory Visit (HOSPITAL_COMMUNITY): Payer: Medicaid Other | Admitting: Physical Therapy

## 2020-01-28 ENCOUNTER — Encounter (HOSPITAL_COMMUNITY): Payer: Medicaid Other | Admitting: Physical Therapy

## 2020-02-01 ENCOUNTER — Encounter (HOSPITAL_COMMUNITY): Payer: Medicaid Other | Admitting: Physical Therapy

## 2020-02-02 ENCOUNTER — Ambulatory Visit (HOSPITAL_COMMUNITY): Payer: Medicaid Other

## 2020-02-02 ENCOUNTER — Encounter (HOSPITAL_COMMUNITY): Payer: Medicaid Other | Admitting: Physical Therapy

## 2020-02-02 ENCOUNTER — Encounter (HOSPITAL_COMMUNITY): Payer: Self-pay

## 2020-02-02 ENCOUNTER — Other Ambulatory Visit: Payer: Self-pay

## 2020-02-02 DIAGNOSIS — R2689 Other abnormalities of gait and mobility: Secondary | ICD-10-CM | POA: Diagnosis not present

## 2020-02-02 DIAGNOSIS — Z89519 Acquired absence of unspecified leg below knee: Secondary | ICD-10-CM

## 2020-02-02 DIAGNOSIS — M6281 Muscle weakness (generalized): Secondary | ICD-10-CM

## 2020-02-02 NOTE — Therapy (Signed)
[place], [place], [address] Phone: [phone]   Fax:  [phone]  Physical Therapy Treatment  Patient Details  Name: Natalie Contreras MRN: [id number] Date of Birth: 1966/06/06 Referring Provider (PT): Meridee Score MD   Encounter Date: 02/02/2020  PT End of Session - 02/02/20 1402    Visit Number  3    Number of Visits  16    Date for PT Re-Evaluation  03/17/20    Authorization Type  Medicaid 3 visits approved    Authorization Time Period  3 visits 5/10-5/23    Authorization - Visit Number  2    Authorization - Number of Visits  3    Progress Note Due on Visit  4    PT Start Time  [id number]    PT Stop Time  1400    PT Time Calculation (min)  47 min    Equipment Utilized During Treatment  Gait belt    Activity Tolerance  Patient tolerated treatment well;Patient limited by fatigue    Behavior During Therapy  [facility] for tasks assessed/performed       Past Medical History:  Diagnosis Date  . Abscess of foot 08/09/2019   WITH ULCER  RIGHT FOOT  . Acute diastolic CHF (congestive heart failure) ([place]) 10/28/2016  . Carotid stenosis, asymptomatic, bilateral 01/15/2017  . Diabetes mellitus without complication ([place])   . Heart murmur   . Hypertension   . PAD (peripheral artery disease) ([place]) 04/10/2017   Left ABI 0.74. Right ABI 0.96.    Past Surgical History:  Procedure Laterality Date  . ABDOMINAL AORTOGRAM W/LOWER EXTREMITY N/A 08/10/2019   Procedure: ABDOMINAL AORTOGRAM W/LOWER EXTREMITY;  Surgeon: Waynetta Sandy, MD;  Location: [place] [facility];  Service: Cardiovascular;  Laterality: N/A;  . AMPUTATION Right 09/03/2019   Procedure: RIGHT BELOW KNEE AMPUTATION;  Surgeon: Newt Minion, MD;  Location: [facility];  Service: Orthopedics;  Laterality: Right;  . AMPUTATION TOE Left 05/05/2017   Procedure: LEFT SECOND TOE AMPUTATION;  Surgeon: Aviva Signs, MD;  [facility];  Service: General;  Laterality:  Left;  . CATARACT EXTRACTION W/PHACO Left 02/08/2016   Procedure: CATARACT EXTRACTION PHACO AND INTRAOCULAR LENS PLACEMENT (IOC);  Surgeon: Tonny Branch, MD;  Location: [facility];  Service: Ophthalmology;  Laterality: Left;  CDE 11.43   . CATARACT EXTRACTION W/PHACO Right 02/26/2016   Procedure: CATARACT EXTRACTION PHACO AND INTRAOCULAR LENS PLACEMENT RIGHT; CDE:  16.90;  Surgeon: Tonny Branch, MD;  Location: [facility];  Service: Ophthalmology;  Laterality: Right;  . CHOLECYSTECTOMY    . FOREIGN BODY REMOVAL Left 04/09/2017   Procedure: FOREIGN BODY REMOVAL ADULT FOOT;  Surgeon: Aviva Signs, MD;  [facility];  Service: General;  Laterality: Left;  . I & D EXTREMITY Right 08/11/2019   Procedure: IRRIGATION AND DEBRIDEMENT EXTREMITY;  Surgeon: Newt Minion, MD;  Location: [place];  Service: Orthopedics;  Laterality: Right;  . PERIPHERAL VASCULAR ATHERECTOMY Right 08/10/2019   Procedure: PERIPHERAL VASCULAR ATHERECTOMY;  Surgeon: Waynetta Sandy, MD;  Location: [facility] [place] [facility];  Service: Cardiovascular;  Laterality: Right;  superficial femoral  . PERIPHERAL VASCULAR BALLOON ANGIOPLASTY Right 08/10/2019   Procedure: PERIPHERAL VASCULAR BALLOON ANGIOPLASTY;  Surgeon: Waynetta Sandy, MD;  Location: [facility] [facility];  Service: Cardiovascular;  Laterality: Right;  anterior tibial  . TRANSMETATARSAL AMPUTATION Left 07/21/2017   Procedure: TRANSMETATARSAL AMPUTATION LEFT FOOT;  Surgeon: Aviva Signs, MD;  [facility];  Service: General;  Laterality: Left;    There were no vitals filed for this visit.  Subjective Assessment - 02/02/20 1312    Subjective  Pt stated she has on 2 thick socks but feels her prostetic limb slides up and down.  Arrived wiht compression hose on Lt LE.                        [facility] Adult PT Treatment/Exercise - 02/02/20 0001      Knee/Hip Exercises: Standing   Functional Squat  10 reps    Gait Training  RW 200 with RW, noted increase  clicking prostetic limb    Other Standing Knee Exercises  Static balance training able to stand wihtout HHA x 40" prior fatigue and need for A; NBOS 2x 30"      Knee/Hip Exercises: Seated   Other Seated Knee/Hip Exercises  scapular retractoin 2x 10 reps RTB    Sit to Sand  2 sets;5 reps;without UE support   min A, cueing for mechaincs            PT Education - 02/02/20 1324    Education Details  Assessed compression hose, did not feel 20-67mmHG.  Educated benefits with new compression garment without zipper for skin integrity.  Measurements taken and paperwork given for [employer].  Encouraged pt to return to [employer] as does not fit leg, encouraged increased sock application and risk of friction and wounds.    Person(s) Educated  Patient    Methods  Explanation;Demonstration;Handout    Comprehension  Verbalized understanding;Returned demonstration       PT Short Term Goals - 01/26/20 1027      PT SHORT TERM GOAL #1   Title  Patient will be independent with initial HEP and self-management strategies to improve functional outcomes    Time  4    Period  Weeks    Status  On-going    Target Date  02/18/20      PT SHORT TERM GOAL #2   Title  Patient will be able to perform stand x 5 in < 15 seconds to demonstrate improvement in functional mobility and reduced risk for falls.    Time  4    Period  Weeks    Status  On-going    Target Date  02/18/20        PT Long Term Goals - 01/26/20 1027      PT LONG TERM GOAL #1   Title  Patient will have equal to or > 4/5 MMT throughout BLE to improve ability to perform functional mobility, stair ambulation and ADLs.    Time  8    Period  Weeks    Status  On-going      PT LONG TERM GOAL #2   Title  Patient will be able to ambulate at least 250 feet during 2MWT with LRAD to demonstrate improved ability to perform functional mobility and associated tasks.    Time  8    Period  Weeks    Status  On-going      PT  LONG TERM GOAL #3   Title  Patient will improve BERG score to >44 to indicate improvement in functional outcomes and reduced risk for falls    Time  8    Period  Weeks    Status  On-going            Plan - 02/02/20 1804    Clinical Impression Statement  Pt  arrived with compression stockings on, unable to find pressure on garment but does not feel tight enough for edema control.  Noted indentions into skin where zipper is anterior shin.  Pt educated on benefits of 20-30 mmHg for edema control, measurements taken and paperwork given for [employer]  Noted prostetic limb is loose, educated pt importance of proper fit to reduce risk of wounds.  Encouraged pt to call company as was just purchased in March this year and sufficient amount of socks.  Session focus with functional strengthening and balance training, min A required for safety.  EOS pt was limited by fatiuge with activiites, no reports of pain.    Personal Factors and Comorbidities  Comorbidity 3+    Comorbidities  CHF, diabetes, BKA    Examination-Activity Limitations  Bathing;Lift;Stand;Bed Mobility;Locomotion Level;Bend;Transfers;Toileting;Reach Overhead;Caring for Others;Carry;Dressing;Squat;Hygiene/Grooming;Stairs    Examination-Participation Restrictions  Laundry;Community Activity;Cleaning;Meal Prep;Yard Work    Stability/Clinical Decision Making  Stable/Uncomplicated    Designer, jewellery  Low    Rehab Potential  Good    PT Frequency  2x / week    PT Duration  8 weeks    PT Treatment/Interventions  ADLs/Self Care Home Management;Aquatic Therapy;Biofeedback;Cryotherapy;Electrical Stimulation;Contrast Bath;Therapeutic exercise;Orthotic Fit/Training;Compression bandaging;Taping;Vasopneumatic Device;Joint Manipulations;Spinal Manipulations;Scar mobilization;Passive range of motion;Prosthetic Training;Wheelchair mobility training;Balance training;DME Instruction;Iontophoresis 4mg /ml Dexamethasone;Neuromuscular  re-education;Gait training;Stair training;Moist Heat;Traction;Functional mobility training;Cognitive remediation;Manual techniques;Dry needling;Energy conservation;Manual lymph drainage;Splinting;Patient/family education;Therapeutic activities;Ultrasound;Parrafin;Fluidtherapy    PT Next Visit Plan  Progress LE strength and balance as tolerated. Progress to standing static balance as able and gait with LRAD.    PT Home Exercise Plan  01/26/20:  single leg stance in walker beginning with 1 UE assist, sit to stands without UE, seated abdominal bracing, seated LAQ.       Patient will benefit from skilled therapeutic intervention in order to improve the following deficits and impairments:  Abnormal gait, Improper body mechanics, Impaired sensation, Decreased activity tolerance, Decreased endurance, Decreased range of motion, Decreased strength, Hypomobility, Decreased balance, Difficulty walking, Increased edema, Prosthetic Dependency  Visit Diagnosis: Muscle weakness (generalized)  Other abnormalities of gait and mobility  Status post below knee amputation, unspecified laterality [facility])     Problem List Patient Active Problem List   Diagnosis Date Noted  . Drug induced constipation   . Postoperative pain   . Morbid obesity ([place])   . Acute blood loss anemia   . Hypoalbuminemia due to protein-calorie malnutrition ([place])   . Stage 3 chronic kidney disease   . Diabetic peripheral neuropathy ([place])   . S/P BKA (below knee amputation) ([place]) 09/06/2019  . Cutaneous abscess of right foot   . Dehiscence of amputation stump ([place])   . 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 Severe protein-calorie malnutrition ([place])   . Gangrene of right foot ([place])   . Abscess or cellulitis of foot 08/08/2019  . Obesity (BMI 30-39.9) 08/08/2019  . Wound infection 06/13/2019  . Cellulitis 07/17/2017  . Subacute osteomyelitis, right ankle and foot ([place])   . Toe amputation status, left 05/05/2017  . Gangrene of left  foot ([place])   . PAD (peripheral artery disease) ([place]) 04/10/2017  . Essential hypertension 04/10/2017  . Chronic diastolic CHF (congestive heart failure) ([place]) 04/10/2017  . AKI (acute kidney injury) ([place]) 04/10/2017  . Foreign body in left foot   . Cellulitis and abscess of foot 04/07/2017  . Carotid stenosis, asymptomatic, bilateral 01/15/2017  . Visual disturbance 01/15/2017  . Tobacco abuse 01/15/2017  . Acute respiratory failure with hypoxia ([place]) 10/30/2016  .  Pulmonary hypertension ([place]) 10/29/2016  . Acute diastolic CHF (congestive heart failure) ([place]) 10/28/2016  . Benign essential HTN 10/28/2016  . Hyperlipidemia 10/28/2016  . Diabetic infection of right foot ([place]) 10/28/2016   Ihor [place], LPTA/CLT; CBIS [phone]  Aldona Lento 02/02/2020, 6:11 PM  [place] [address] [place], [place], [address] Phone: [phone]   Fax:  [phone]  Name: Natalie Contreras MRN: [id number] Date of Birth: December 30, 1965

## 2020-02-07 ENCOUNTER — Ambulatory Visit (HOSPITAL_COMMUNITY): Payer: Medicaid Other

## 2020-02-07 ENCOUNTER — Other Ambulatory Visit: Payer: Self-pay

## 2020-02-07 ENCOUNTER — Encounter (HOSPITAL_COMMUNITY): Payer: Medicaid Other | Admitting: Physical Therapy

## 2020-02-07 ENCOUNTER — Encounter (HOSPITAL_COMMUNITY): Payer: Self-pay

## 2020-02-07 DIAGNOSIS — M6281 Muscle weakness (generalized): Secondary | ICD-10-CM

## 2020-02-07 DIAGNOSIS — R2689 Other abnormalities of gait and mobility: Secondary | ICD-10-CM

## 2020-02-07 NOTE — Therapy (Addendum)
[facility] [place], [place], [address] Phone: [phone]   Fax:  [phone]  Physical Therapy Treatment  Patient Details  Name: Natalie Contreras MRN: [id number] Date of Birth: [birth year] Referring Provider (PT): Meridee Score MD   Encounter Date: 02/07/2020  PT End of Session - 02/07/20 1450    Visit Number  4    Number of Visits  16    Date for PT Re-Evaluation  03/17/20    Authorization Type  Medicaid 3 visits approved    Authorization Time Period  3 visits 5/10-5/23    Authorization - Visit Number  3    Authorization - Number of Visits  3    Progress Note Due on Visit  4    PT Start Time  [id number]    PT Stop Time  1530    PT Time Calculation (min)  45 min    Equipment Utilized During Treatment  Gait belt    Activity Tolerance  Patient tolerated treatment well;Patient limited by fatigue    Behavior During Therapy  [facility] for tasks assessed/performed       Past Medical History:  Diagnosis Date  . Abscess of foot 08/09/2019   WITH ULCER  RIGHT FOOT  . Acute diastolic CHF (congestive heart failure) ([place]) 10/28/2016  . Carotid stenosis, asymptomatic, bilateral 01/15/2017  . Diabetes mellitus without complication ([place])   . Heart murmur   . Hypertension   . PAD (peripheral artery disease) ([place]) 04/10/2017   Left ABI 0.74. Right ABI 0.96.    Past Surgical History:  Procedure Laterality Date  . ABDOMINAL AORTOGRAM W/LOWER EXTREMITY N/A 08/10/2019   Procedure: ABDOMINAL AORTOGRAM W/LOWER EXTREMITY;  Surgeon: Waynetta Sandy, MD;  Location: [facility];  Service: Cardiovascular;  Laterality: N/A;  . AMPUTATION Right 09/03/2019   Procedure: RIGHT BELOW KNEE AMPUTATION;  Surgeon: Newt Minion, MD;  Location: [facility];  Service: Orthopedics;  Laterality: Right;  . AMPUTATION TOE Left 05/05/2017   Procedure: LEFT SECOND TOE AMPUTATION;  Surgeon: Aviva Signs, MD;  [facility];  Service: General;  Laterality:  Left;  . CATARACT EXTRACTION W/PHACO Left 02/08/2016   Procedure: CATARACT EXTRACTION PHACO AND INTRAOCULAR LENS PLACEMENT (IOC);  Surgeon: Tonny Branch, MD;  Location: [facility];  Service: Ophthalmology;  Laterality: Left;  CDE 11.43   . CATARACT EXTRACTION W/PHACO Right 02/26/2016   Procedure: CATARACT EXTRACTION PHACO AND INTRAOCULAR LENS PLACEMENT RIGHT; CDE:  16.90;  Surgeon: Tonny Branch, MD;  Location: [facility];  Service: Ophthalmology;  Laterality: Right;  . CHOLECYSTECTOMY    . FOREIGN BODY REMOVAL Left 04/09/2017   Procedure: FOREIGN BODY REMOVAL ADULT FOOT;  Surgeon: Aviva Signs, MD;  Location: [facility];  Service: General;  Laterality: Left;  . I & D EXTREMITY Right 08/11/2019   Procedure: IRRIGATION AND DEBRIDEMENT EXTREMITY;  Surgeon: Newt Minion, MD;  Location: [facility];  Service: Orthopedics;  Laterality: Right;  . PERIPHERAL VASCULAR ATHERECTOMY Right 08/10/2019   Procedure: PERIPHERAL VASCULAR ATHERECTOMY;  Surgeon: Waynetta Sandy, MD;  Location: [facility];  Service: Cardiovascular;  Laterality: Right;  superficial femoral  . PERIPHERAL VASCULAR BALLOON ANGIOPLASTY Right 08/10/2019   Procedure: PERIPHERAL VASCULAR BALLOON ANGIOPLASTY;  Surgeon: Waynetta Sandy, MD;  Location: [facility];  Service: Cardiovascular;  Laterality: Right;  anterior tibial  . TRANSMETATARSAL AMPUTATION Left 07/21/2017   Procedure: TRANSMETATARSAL AMPUTATION LEFT FOOT;  Surgeon: Aviva Signs, MD;  [facility];  Service: General;  Laterality: Left;    There were no vitals filed for this visit.  Subjective Assessment - 02/07/20 1448    Subjective  Pt stated her prostetic limb starting clicking at beginning of session, applied more socks and continues to slide in limb.  Has apt at [facility] tomorrow.    Pertinent History  CHF, Diabetes    Patient Stated Goals  Just want to walk without assistance    Currently in Pain?  No/denies         [facility] PT Assessment - 02/07/20  0001      Assessment   Medical Diagnosis  RT BKA    Referring Provider (PT)  Meridee Score MD    Onset Date/Surgical Date  09/03/19    Next MD Visit  --   Unscheduled   Prior Therapy  Yes, 2 weeks IP      Precautions   Precautions  Fall      Strength   Strength Assessment Site  Hip;Knee    Right/Left Hip  Left;Right    Right Hip Flexion  4+/5   was 4+/5   Right Hip Extension  4-/5   was 4-/5   Right Hip ABduction  4-/5   was 4-/5   Left Hip Flexion  3+/5   was 3+   Left Hip Extension  3-/5   was 3-   Left Hip ABduction  4/5   was 4/5   Right Knee Flexion  5/5    Right Knee Extension  5/5    Left Knee Flexion  4/5    Left Knee Extension  4+/5      Ambulation/Gait   Gait Comments  2MWT      Standardized Balance Assessment   Standardized Balance Assessment  Berg Balance Test      Berg Balance Test   Sit to Stand  Able to stand  independently using hands    Standing Unsupported  Able to stand safely 2 minutes    Sitting with Back Unsupported but Feet Supported on Floor or Stool  Able to sit safely and securely 2 minutes    Stand to Sit  Sits safely with minimal use of hands    Transfers  Able to transfer safely, definite need of hands    Standing Unsupported with Eyes Closed  Unable to keep eyes closed 3 seconds but stays steady    Standing Unsupported with Feet Together  Able to place feet together independently but unable to hold for 30 seconds    From Standing, Reach Forward with Outstretched Arm  Can reach forward >5 cm safely (2")    From Standing Position, Pick up Object from Floor  Unable to pick up and needs supervision    From Standing Position, Turn to Look Behind Over each Shoulder  Needs supervision when turning    Turn 360 Degrees  Needs assistance while turning    Standing Unsupported, Alternately Place Feet on Step/Stool  Needs assistance to keep from falling or unable to try    Standing Unsupported, One Foot in Front  Needs help to step but can hold 15  seconds    Standing on One Leg  Unable to try or needs assist to prevent fall    Total Score  26    Berg comment:  was 24 eval                    [facility] Adult PT Treatment/Exercise - 02/07/20 0001      Ambulation/Gait  Ambulation/Gait  Yes    Ambulation/Gait Assistance  6: Modified independent (Device/Increase time);5: Supervision    Ambulation Distance (Feet)  82 Feet   Prostetic limb loose   Assistive device  Rolling walker    Gait Pattern  Decreased step length - right;Decreased step length - left;Decreased stride length;Trunk flexed;Decreased dorsiflexion - right    Ambulation Surface  Level;Indoor      Knee/Hip Exercises: Standing   Gait Training  2MWT 10ft, limited by fatigue and c/o limb clicking and too loose    Other Standing Knee Exercises  BERG       Knee/Hip Exercises: Seated   Long Arc Quad  Both;10 reps;Limitations    Long Arc Quad Weight  2 lbs.    Long [employer] Limitations  5 second holds    Other Seated Knee/Hip Exercises  scapular retractoin 2x 10 reps RTB    Sit to [employer]  2 sets;5 reps;without UE support   5 STS 19.16" HHA required              PT Short Term Goals - 02/07/20 1450      PT SHORT TERM GOAL #1   Title  Patient will be independent with initial HEP and self-management strategies to improve functional outcomes    Baseline  02/07/20:  Reports compliance with HEP daily.    Status  On-going      PT SHORT TERM GOAL #2   Title  Patient will be able to perform stand x 5 in < 15 seconds to demonstrate improvement in functional mobility and reduced risk for falls.    Baseline  02/07/20:  HHA required 5xSTS 19.16"; more weight bearing with Lt LE    Status  On-going        PT Long Term Goals - 02/07/20 1550      PT LONG TERM GOAL #1   Title  Patient will have equal to or > 4/5 MMT throughout BLE to improve ability to perform functional mobility, stair ambulation and ADLs.    Baseline  02/07/20: see MMT    Time  8      PT LONG  TERM GOAL #2   Title  Patient will be able to ambulate at least 250 feet during 2MWT with LRAD to demonstrate improved ability to perform functional mobility and associated tasks.    Baseline  02/07/20:  2MWT 82 feet; c/o clicking and too loose prostetic limb.  Has apt with [facility] tomorrow 02/08/20.      PT LONG TERM GOAL #3   Title  Patient will improve BERG score to >44 to indicate improvement in functional outcomes and reduced risk for falls    Baseline  02/07/20:  BERG score 26    Status  On-going            Plan - 02/07/20 1538    Clinical Impression Statement  Reviewed goals for Medicaid auth with 2MWT, MMT and BERG testing complete this session.  Pt c/o prostetic limb begin very loose and clicking, has apt tomorrow at [facility] to address.  Reviewed importance of sufficient amount of socks for skin integrity, pt stated she is wearing all the thick liners she owns.  Pt continues to present with weakness in Lt hip musculature, reviewed importance of HEP compliance for maximal benefits.  Improved 2 points with BERG balance testing with static standing.  EOS pt limited by fatigue.    Personal Factors and Comorbidities  Comorbidity 3+    Comorbidities  CHF, diabetes, BKA    Examination-Activity Limitations  Bathing;Lift;Stand;Bed Mobility;Locomotion Level;Bend;Transfers;Toileting;Reach Overhead;Caring for Others;Carry;Dressing;Squat;Hygiene/Grooming;Stairs    Examination-Participation Restrictions  Laundry;Community Activity;Cleaning;Meal Prep;Yard Work    Stability/Clinical Decision Making  Stable/Uncomplicated    Designer, jewellery  Low    Rehab Potential  Good    PT Frequency  2x / week    PT Duration  8 weeks    PT Treatment/Interventions  ADLs/Self Care Home Management;Aquatic Therapy;Biofeedback;Cryotherapy;Electrical Stimulation;Contrast Bath;Therapeutic exercise;Orthotic Fit/Training;Compression bandaging;Taping;Vasopneumatic Device;Joint Manipulations;Spinal  Manipulations;Scar mobilization;Passive range of motion;Prosthetic Training;Wheelchair mobility training;Balance training;DME Instruction;Iontophoresis 4mg /ml Dexamethasone;Neuromuscular re-education;Gait training;Stair training;Moist Heat;Traction;Functional mobility training;Cognitive remediation;Manual techniques;Dry needling;Energy conservation;Manual lymph drainage;Splinting;Patient/family education;Therapeutic activities;Ultrasound;Parrafin;Fluidtherapy    PT Next Visit Plan  Progress LE strength and balance as tolerated. Progress to standing static balance as able and gait with LRAD.    PT Home Exercise Plan  01/26/20:  single leg stance in walker beginning with 1 UE assist, sit to stands without UE, seated abdominal bracing, seated LAQ.       Patient will benefit from skilled therapeutic intervention in order to improve the following deficits and impairments:  Abnormal gait, Improper body mechanics, Impaired sensation, Decreased activity tolerance, Decreased endurance, Decreased range of motion, Decreased strength, Hypomobility, Decreased balance, Difficulty walking, Increased edema, Prosthetic Dependency  Visit Diagnosis: Muscle weakness (generalized)  Other abnormalities of gait and mobility     Problem List Patient Active Problem List   Diagnosis Date Noted  . Drug induced constipation   . Postoperative pain   . Morbid obesity ([place])   . Acute blood loss anemia   . Hypoalbuminemia due to protein-calorie malnutrition ([place])   . Stage 3 chronic kidney disease   . Diabetic peripheral neuropathy ([place])   . S/P BKA (below knee amputation) ([place]) 09/06/2019  . Cutaneous abscess of right foot   . Dehiscence of amputation stump ([place])   . 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 Severe protein-calorie malnutrition ([place])   . Gangrene of right foot ([place])   . Abscess or cellulitis of foot 08/08/2019  . Obesity (BMI 30-39.9) 08/08/2019  . Wound infection 06/13/2019  . Cellulitis 07/17/2017   . Subacute osteomyelitis, right ankle and foot ([place])   . Toe amputation status, left 05/05/2017  . Gangrene of left foot ([place])   . PAD (peripheral artery disease) ([place]) 04/10/2017  . Essential hypertension 04/10/2017  . Chronic diastolic CHF (congestive heart failure) ([place]) 04/10/2017  . AKI (acute kidney injury) ([place]) 04/10/2017  . Foreign body in left foot   . Cellulitis and abscess of foot 04/07/2017  . Carotid stenosis, asymptomatic, bilateral 01/15/2017  . Visual disturbance 01/15/2017  . Tobacco abuse 01/15/2017  . Acute respiratory failure with hypoxia ([place]) 10/30/2016  . Pulmonary hypertension ([place]) 10/29/2016  . Acute diastolic CHF (congestive heart failure) ([place]) 10/28/2016  . Benign essential HTN 10/28/2016  . Hyperlipidemia 10/28/2016  . Diabetic infection of right foot ([place]) 10/28/2016   Ihor Austin, LPTA/CLT; CBIS [phone]  Aldona Lento 02/07/2020, 3:51 PM  [facility] [address] [place], [place], [address] Phone: [phone]   Fax:  [phone]  Name: Natalie Contreras MRN: [id number] Date of Birth: 1965-12-02

## 2020-02-09 ENCOUNTER — Encounter (HOSPITAL_COMMUNITY): Payer: Medicaid Other | Admitting: Physical Therapy

## 2020-02-15 ENCOUNTER — Encounter (HOSPITAL_COMMUNITY): Payer: Medicaid Other | Admitting: Physical Therapy

## 2020-02-16 ENCOUNTER — Ambulatory Visit (HOSPITAL_COMMUNITY): Payer: Medicaid Other | Attending: Orthopedic Surgery | Admitting: Physical Therapy

## 2020-02-16 ENCOUNTER — Other Ambulatory Visit: Payer: Self-pay

## 2020-02-16 DIAGNOSIS — M6281 Muscle weakness (generalized): Secondary | ICD-10-CM | POA: Diagnosis present

## 2020-02-16 DIAGNOSIS — R2689 Other abnormalities of gait and mobility: Secondary | ICD-10-CM | POA: Insufficient documentation

## 2020-02-16 DIAGNOSIS — Z89519 Acquired absence of unspecified leg below knee: Secondary | ICD-10-CM | POA: Insufficient documentation

## 2020-02-16 NOTE — Therapy (Signed)
[place], [place], [address] Phone: [phone]   Fax:  [phone]  Physical Therapy Treatment  Patient Details  Name: Natalie Contreras MRN: [id number] Date of Birth: Apr 12, 1966 Referring Provider (PT): Meridee Score MD   Encounter Date: 02/16/2020  PT End of Session - 02/16/20 1512    Visit Number  5    Number of Visits  16    Date for PT Re-Evaluation  03/17/20    Authorization Type  Medicaid 3 visits approved    Authorization Time Period  3 visits 5/10-5/23, 11 visits approved 5/26-7/06    Authorization - Visit Number  1    Authorization - Number of Visits  11    Progress Note Due on Visit  16    PT Start Time  1450    PT Stop Time  1535    PT Time Calculation (min)  45 min    Equipment Utilized During Treatment  Gait belt    Activity Tolerance  Patient tolerated treatment well;Patient limited by fatigue    Behavior During Therapy  [facility] for tasks assessed/performed       Past Medical History:  Diagnosis Date  . Abscess of foot 08/09/2019   WITH ULCER  RIGHT FOOT  . Acute diastolic CHF (congestive heart failure) ([place]) 10/28/2016  . Carotid stenosis, asymptomatic, bilateral 01/15/2017  . Diabetes mellitus without complication ([place])   . Heart murmur   . Hypertension   . PAD (peripheral artery disease) ([place]) 04/10/2017   Left ABI 0.74. Right ABI 0.96.    Past Surgical History:  Procedure Laterality Date  . ABDOMINAL AORTOGRAM W/LOWER EXTREMITY N/A 08/10/2019   Procedure: ABDOMINAL AORTOGRAM W/LOWER EXTREMITY;  Surgeon: Waynetta Sandy, MD;  Location: [facility];  Service: Cardiovascular;  Laterality: N/A;  . AMPUTATION Right 09/03/2019   Procedure: RIGHT BELOW KNEE AMPUTATION;  Surgeon: Newt Minion, MD;  Location: [facility];  Service: Orthopedics;  Laterality: Right;  . AMPUTATION TOE Left 05/05/2017   Procedure: LEFT SECOND TOE AMPUTATION;  Surgeon: Aviva Signs, MD;  [facility];   Service: General;  Laterality: Left;  . CATARACT EXTRACTION W/PHACO Left 02/08/2016   Procedure: CATARACT EXTRACTION PHACO AND INTRAOCULAR LENS PLACEMENT (IOC);  Surgeon: Tonny Branch, MD;  [facility];  Service: Ophthalmology;  Laterality: Left;  CDE 11.43   . CATARACT EXTRACTION W/PHACO Right 02/26/2016   Procedure: CATARACT EXTRACTION PHACO AND INTRAOCULAR LENS PLACEMENT RIGHT; CDE:  16.90;  Surgeon: Tonny Branch, MD;  [facility];  Service: Ophthalmology;  Laterality: Right;  . CHOLECYSTECTOMY    . FOREIGN BODY REMOVAL Left 04/09/2017   Procedure: FOREIGN BODY REMOVAL ADULT FOOT;  Surgeon: Aviva Signs, MD;  [facility];  Service: General;  Laterality: Left;  . I & D EXTREMITY Right 08/11/2019   Procedure: IRRIGATION AND DEBRIDEMENT EXTREMITY;  Surgeon: Newt Minion, MD;  Location: [facility];  Service: Orthopedics;  Laterality: Right;  . PERIPHERAL VASCULAR ATHERECTOMY Right 08/10/2019   Procedure: PERIPHERAL VASCULAR ATHERECTOMY;  Surgeon: Waynetta Sandy, MD;  Location: [facility];  Service: Cardiovascular;  Laterality: Right;  superficial femoral  . PERIPHERAL VASCULAR BALLOON ANGIOPLASTY Right 08/10/2019   Procedure: PERIPHERAL VASCULAR BALLOON ANGIOPLASTY;  Surgeon: Waynetta Sandy, MD;  Location: [facility];  Service: Cardiovascular;  Laterality: Right;  anterior tibial  . TRANSMETATARSAL AMPUTATION Left 07/21/2017   Procedure: TRANSMETATARSAL AMPUTATION LEFT FOOT;  Surgeon: Aviva Signs, MD;  [facility];  Service: General;  Laterality: Left;    There were no vitals filed for this visit.  Subjective Assessment - 02/16/20 1457    Subjective  pt states she returned to [facility] and they put more padding around residual limb and it feels much more secure.  Reports pain at Rt knee cap when up ambulating since addition of padding that can get up to a 6/10 with increased distance.    Currently in Pain?  No/denies                         [facility] Adult PT Treatment/Exercise - 02/16/20 0001      Knee/Hip Exercises: Standing   Hip Abduction  Both;2 sets;10 reps    Hip Extension  Both;2 sets;10 reps    SLS  bilateral with 1 HHA 15-20 seconds    Gait Training  RW (attempted QC, however not ready yet)    Other Standing Knee Exercises  semi tandem stance, NBOS stance 30" x2 each    Other Standing Knee Exercises  RTB rows standing in walker 2X10 reps      Knee/Hip Exercises: Seated   Long Arc Quad  Both;10 reps;Limitations    Long Arc Quad Weight  3 lbs.    Long [employer] Limitations  5 second holds    Sit to [employer]  2 sets;5 reps;without UE support   5 reps with 1 UE assist, 5 reps without UE on foam riser            PT Education - 02/16/20 1514    Education Details  educated to go ahead and call to order new compression garments for Lt LE (pt has the measurements and information).  Encouraged to work on standing balance at sink, sit to stands at home    [employer]) Educated  Patient    Methods  Explanation    Comprehension  Verbalized understanding       PT Short Term Goals - 02/07/20 1450      PT SHORT TERM GOAL #1   Title  Patient will be independent with initial HEP and self-management strategies to improve functional outcomes    Baseline  02/07/20:  Reports compliance with HEP daily.    Status  On-going      PT SHORT TERM GOAL #2   Title  Patient will be able to perform stand x 5 in < 15 seconds to demonstrate improvement in functional mobility and reduced risk for falls.    Baseline  02/07/20:  HHA required 5xSTS 19.16"; more weight bearing with Lt LE    Status  On-going        PT Long Term Goals - 02/07/20 1550      PT LONG TERM GOAL #1   Title  Patient will have equal to or > 4/5 MMT throughout BLE to improve ability to perform functional mobility, stair ambulation and ADLs.    Baseline  02/07/20: see MMT    Time  8      PT LONG TERM GOAL #2   Title  Patient will  be able to ambulate at least 250 feet during 2MWT with LRAD to demonstrate improved ability to perform functional mobility and associated tasks.    Baseline  02/07/20:  2MWT 82 feet; c/o clicking and too loose prostetic limb.  Has apt with [facility] tomorrow 02/08/20.      PT LONG TERM GOAL #3   Title  Patient will improve BERG score  to >44 to indicate improvement in functional outcomes and reduced risk for falls    Baseline  02/07/20:  BERG score 26    Status  On-going            Plan - 02/16/20 1703    Clinical Impression Statement  Medicaid approved 11 more visits.  Pt returns today with much improved fitting prosthesis, still has not ordered her compression stocking as she was waiting until she had more money.  Encouraged to do so as soon as possible.  Progressed stability exercises to include static stance (challenging) and additional therex to improve LE and glute strength/stability.  Stood with QC, however pt not confident enough to try taking a step using this.  Encouraged to increase activity at home.    Personal Factors and Comorbidities  Comorbidity 3+    Comorbidities  CHF, diabetes, BKA    Examination-Activity Limitations  Bathing;Lift;Stand;Bed Mobility;Locomotion Level;Bend;Transfers;Toileting;Reach Overhead;Caring for Others;Carry;Dressing;Squat;Hygiene/Grooming;Stairs    Examination-Participation Restrictions  Laundry;Community Activity;Cleaning;Meal Prep;Yard Work    Stability/Clinical Decision Making  Stable/Uncomplicated    Rehab Potential  Good    PT Frequency  2x / week    PT Duration  8 weeks    PT Treatment/Interventions  ADLs/Self Care Home Management;Aquatic Therapy;Biofeedback;Cryotherapy;Electrical Stimulation;Contrast Bath;Therapeutic exercise;Orthotic Fit/Training;Compression bandaging;Taping;Vasopneumatic Device;Joint Manipulations;Spinal Manipulations;Scar mobilization;Passive range of motion;Prosthetic Training;Wheelchair mobility training;Balance  training;DME Instruction;Iontophoresis 4mg /ml Dexamethasone;Neuromuscular re-education;Gait training;Stair training;Moist Heat;Traction;Functional mobility training;Cognitive remediation;Manual techniques;Dry needling;Energy conservation;Manual lymph drainage;Splinting;Patient/family education;Therapeutic activities;Ultrasound;Parrafin;Fluidtherapy    PT Next Visit Plan  Progress LE strength and balance as tolerated. Progress to standing static balance as able and gait with LRAD.    PT Home Exercise Plan  01/26/20:  single leg stance in walker beginning with 1 UE assist, sit to stands without UE, seated abdominal bracing, seated LAQ.       Patient will benefit from skilled therapeutic intervention in order to improve the following deficits and impairments:  Abnormal gait, Improper body mechanics, Impaired sensation, Decreased activity tolerance, Decreased endurance, Decreased range of motion, Decreased strength, Hypomobility, Decreased balance, Difficulty walking, Increased edema, Prosthetic Dependency  Visit Diagnosis: Other abnormalities of gait and mobility  Status post below knee amputation, unspecified laterality (HCC)  Muscle weakness (generalized)     Problem List Patient Active Problem List   Diagnosis Date Noted  . Drug induced constipation   . Postoperative pain   . Morbid obesity ([place])   . Acute blood loss anemia   . Hypoalbuminemia due to protein-calorie malnutrition ([place])   . Stage 3 chronic kidney disease   . Diabetic peripheral neuropathy ([place])   . S/P BKA (below knee amputation) ([place]) 09/06/2019  . Cutaneous abscess of right foot   . Dehiscence of amputation stump ([place])   . 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 Severe protein-calorie malnutrition ([place])   . Gangrene of right foot ([place])   . Abscess or cellulitis of foot 08/08/2019  . Obesity (BMI 30-39.9) 08/08/2019  . Wound infection 06/13/2019  . Cellulitis 07/17/2017  . Subacute osteomyelitis, right ankle and foot  ([place])   . Toe amputation status, left 05/05/2017  . Gangrene of left foot ([place])   . PAD (peripheral artery disease) ([place]) 04/10/2017  . Essential hypertension 04/10/2017  . Chronic diastolic CHF (congestive heart failure) ([place]) 04/10/2017  . AKI (acute kidney injury) ([place]) 04/10/2017  . Foreign body in left foot   . Cellulitis and abscess of foot 04/07/2017  . Carotid stenosis, asymptomatic, bilateral 01/15/2017  . Visual disturbance 01/15/2017  . Tobacco abuse 01/15/2017  .  Acute respiratory failure with hypoxia ([place]) 10/30/2016  . Pulmonary hypertension ([place]) 10/29/2016  . Acute diastolic CHF (congestive heart failure) ([place]) 10/28/2016  . Benign essential HTN 10/28/2016  . Hyperlipidemia 10/28/2016  . Diabetic infection of right foot ([place]) 10/28/2016   Teena Irani, PTA/CLT [phone]  Teena Irani 02/16/2020, 5:06 PM  [facility] [address] [place], [place], [address] Phone: [phone]   Fax:  [phone]  Name: Natalie Contreras MRN: [id number] Date of Birth: 1966-03-11

## 2020-02-17 ENCOUNTER — Encounter (HOSPITAL_COMMUNITY): Payer: Medicaid Other | Admitting: Physical Therapy

## 2020-02-18 ENCOUNTER — Ambulatory Visit (HOSPITAL_COMMUNITY): Payer: Medicaid Other

## 2020-02-18 ENCOUNTER — Encounter (HOSPITAL_COMMUNITY): Payer: Self-pay

## 2020-02-18 ENCOUNTER — Other Ambulatory Visit: Payer: Self-pay

## 2020-02-18 DIAGNOSIS — R2689 Other abnormalities of gait and mobility: Secondary | ICD-10-CM | POA: Diagnosis not present

## 2020-02-18 DIAGNOSIS — M6281 Muscle weakness (generalized): Secondary | ICD-10-CM

## 2020-02-18 NOTE — Therapy (Signed)
[name], [place], [address] Phone: [phone]   Fax:  [phone]  Physical Therapy Treatment  Patient Details  Name: Natalie Contreras MRN: [id number] Date of Birth: 11/19/1965 Referring Provider (PT): Meridee Score MD   Encounter Date: 02/18/2020  PT End of Session - 02/18/20 1458    Visit Number  6    Number of Visits  16    Date for PT Re-Evaluation  03/17/20    Authorization Type  Medicaid    Authorization Time Period  3 visits 5/10-5/23, 11 visits approved 5/26-7/06    Authorization - Visit Number  2    Authorization - Number of Visits  11    Progress Note Due on Visit  16    PT Start Time  7510    PT Stop Time  1527    PT Time Calculation (min)  38 min    Equipment Utilized During Treatment  Gait belt    Activity Tolerance  Patient tolerated treatment well;Patient limited by fatigue   Pt c/o nausea, reports reduced at EOS.   Behavior During Therapy  [facility] for tasks assessed/performed       Past Medical History:  Diagnosis Date  . Abscess of foot 08/09/2019   WITH ULCER  RIGHT FOOT  . Acute diastolic CHF (congestive heart failure) ([place]) 10/28/2016  . Carotid stenosis, asymptomatic, bilateral 01/15/2017  . Diabetes mellitus without complication ([place])   . Heart murmur   . Hypertension   . PAD (peripheral artery disease) ([place]) 04/10/2017   Left ABI 0.74. Right ABI 0.96.    Past Surgical History:  Procedure Laterality Date  . ABDOMINAL AORTOGRAM W/LOWER EXTREMITY N/A 08/10/2019   Procedure: ABDOMINAL AORTOGRAM W/LOWER EXTREMITY;  Surgeon: Waynetta Sandy, MD;  Location: [place] [facility];  Service: Cardiovascular;  Laterality: N/A;  . AMPUTATION Right 09/03/2019   Procedure: RIGHT BELOW KNEE AMPUTATION;  Surgeon: Newt Minion, MD;  Location: [place];  Service: Orthopedics;  Laterality: Right;  . AMPUTATION TOE Left 05/05/2017   Procedure: LEFT SECOND TOE AMPUTATION;  Surgeon: Aviva Signs, MD;   Location: [facility];  Service: General;  Laterality: Left;  . CATARACT EXTRACTION W/PHACO Left 02/08/2016   Procedure: CATARACT EXTRACTION PHACO AND INTRAOCULAR LENS PLACEMENT (IOC);  Surgeon: Tonny Branch, MD;  Location: [facility];  Service: Ophthalmology;  Laterality: Left;  CDE 11.43   . CATARACT EXTRACTION W/PHACO Right 02/26/2016   Procedure: CATARACT EXTRACTION PHACO AND INTRAOCULAR LENS PLACEMENT RIGHT; CDE:  16.90;  Surgeon: Tonny Branch, MD;  Location: [facility];  Service: Ophthalmology;  Laterality: Right;  . CHOLECYSTECTOMY    . FOREIGN BODY REMOVAL Left 04/09/2017   Procedure: FOREIGN BODY REMOVAL ADULT FOOT;  Surgeon: Aviva Signs, MD;  Location: [facility];  Service: General;  Laterality: Left;  . I & D EXTREMITY Right 08/11/2019   Procedure: IRRIGATION AND DEBRIDEMENT EXTREMITY;  Surgeon: Newt Minion, MD;  Location: [place];  Service: Orthopedics;  Laterality: Right;  . PERIPHERAL VASCULAR ATHERECTOMY Right 08/10/2019   Procedure: PERIPHERAL VASCULAR ATHERECTOMY;  Surgeon: Waynetta Sandy, MD;  Location: [place] [facility];  Service: Cardiovascular;  Laterality: Right;  superficial femoral  . PERIPHERAL VASCULAR BALLOON ANGIOPLASTY Right 08/10/2019   Procedure: PERIPHERAL VASCULAR BALLOON ANGIOPLASTY;  Surgeon: Waynetta Sandy, MD;  Location: [place] [facility];  Service: Cardiovascular;  Laterality: Right;  anterior tibial  . TRANSMETATARSAL AMPUTATION Left 07/21/2017   Procedure: TRANSMETATARSAL AMPUTATION LEFT FOOT;  Surgeon: Arnoldo Morale,  Elta Guadeloupe, MD;  Location: [facility];  Service: General;  Laterality: Left;    There were no vitals filed for this visit.  Subjective Assessment - 02/18/20 1457    Subjective  Pt c/o feeling nausea at entrance, no reports of vomiting or recent fall.  Stated he limb was clicking earlier, applied additoinal layers for assistance.    Patient Stated Goals  Just want to walk without assistance    Currently in Pain?  No/denies                         [facility] Adult PT Treatment/Exercise - 02/18/20 0001      Knee/Hip Exercises: Standing   Hip Abduction  Both;2 sets;10 reps    Abduction Limitations  3" holds    Hip Extension  Both;2 sets;10 reps    Functional Squat  10 reps    Functional Squat Limitations  cueing for mechanics    SLS with Vectors  5x5" BLE with HHa    Other Standing Knee Exercises  semi tandem stance 2x 30"; sidestep 4RT inside // bars    Other Standing Knee Exercises  RTB rows standing in walker 2X10 reps      Knee/Hip Exercises: Seated   Sit to Sand  2 sets;5 reps;without UE support   elevated height by 1in              PT Short Term Goals - 02/07/20 1450      PT SHORT TERM GOAL #1   Title  Patient will be independent with initial HEP and self-management strategies to improve functional outcomes    Baseline  02/07/20:  Reports compliance with HEP daily.    Status  On-going      PT SHORT TERM GOAL #2   Title  Patient will be able to perform stand x 5 in < 15 seconds to demonstrate improvement in functional mobility and reduced risk for falls.    Baseline  02/07/20:  HHA required 5xSTS 19.16"; more weight bearing with Lt LE    Status  On-going        PT Long Term Goals - 02/07/20 1550      PT LONG TERM GOAL #1   Title  Patient will have equal to or > 4/5 MMT throughout BLE to improve ability to perform functional mobility, stair ambulation and ADLs.    Baseline  02/07/20: see MMT    Time  8      PT LONG TERM GOAL #2   Title  Patient will be able to ambulate at least 250 feet during 2MWT with LRAD to demonstrate improved ability to perform functional mobility and associated tasks.    Baseline  02/07/20:  2MWT 82 feet; c/o clicking and too loose prostetic limb.  Has apt with [facility] tomorrow 02/08/20.      PT LONG TERM GOAL #3   Title  Patient will improve BERG score to >44 to indicate improvement in functional outcomes and reduced risk for falls    Baseline   02/07/20:  BERG score 26    Status  On-going            Plan - 02/18/20 1530    Clinical Impression Statement  Pt c/o nausea initial sessoin and requested no gait this session.  Session focus with hip strengthening and balance training.  Added vector stance and sidestepping to improve SLS and hip strengthening.  Pt required cueing for posture through session.  No LOB  episodes of c/o pain during session, reports she feels better at EOS>    Personal Factors and Comorbidities  Comorbidity 3+    Comorbidities  CHF, diabetes, BKA    Examination-Activity Limitations  Bathing;Lift;Stand;Bed Mobility;Locomotion Level;Bend;Transfers;Toileting;Reach Overhead;Caring for Others;Carry;Dressing;Squat;Hygiene/Grooming;Stairs    Examination-Participation Restrictions  Laundry;Community Activity;Cleaning;Meal Prep;Yard Work    Stability/Clinical Decision Making  Stable/Uncomplicated    Designer, jewellery  Low    Rehab Potential  Good    PT Frequency  2x / week    PT Duration  8 weeks    PT Treatment/Interventions  ADLs/Self Care Home Management;Aquatic Therapy;Biofeedback;Cryotherapy;Electrical Stimulation;Contrast Bath;Therapeutic exercise;Orthotic Fit/Training;Compression bandaging;Taping;Vasopneumatic Device;Joint Manipulations;Spinal Manipulations;Scar mobilization;Passive range of motion;Prosthetic Training;Wheelchair mobility training;Balance training;DME Instruction;Iontophoresis 4mg /ml Dexamethasone;Neuromuscular re-education;Gait training;Stair training;Moist Heat;Traction;Functional mobility training;Cognitive remediation;Manual techniques;Dry needling;Energy conservation;Manual lymph drainage;Splinting;Patient/family education;Therapeutic activities;Ultrasound;Parrafin;Fluidtherapy    PT Next Visit Plan  Progress LE strength and balance as tolerated. Progress to standing static balance as able and gait with LRAD.    PT Home Exercise Plan  01/26/20:  single leg stance in walker beginning with 1  UE assist, sit to stands without UE, seated abdominal bracing, seated LAQ.       Patient will benefit from skilled therapeutic intervention in order to improve the following deficits and impairments:  Abnormal gait, Improper body mechanics, Impaired sensation, Decreased activity tolerance, Decreased endurance, Decreased range of motion, Decreased strength, Hypomobility, Decreased balance, Difficulty walking, Increased edema, Prosthetic Dependency  Visit Diagnosis: Muscle weakness (generalized)  Other abnormalities of gait and mobility     Problem List Patient Active Problem List   Diagnosis Date Noted  . Drug induced constipation   . Postoperative pain   . Morbid obesity ([place])   . Acute blood loss anemia   . Hypoalbuminemia due to protein-calorie malnutrition ([place])   . Stage 3 chronic kidney disease   . Diabetic peripheral neuropathy ([place])   . S/P BKA (below knee amputation) ([place]) 09/06/2019  . Cutaneous abscess of right foot   . Dehiscence of amputation stump ([place])   . 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 Severe protein-calorie malnutrition ([place])   . Gangrene of right foot ([place])   . Abscess or cellulitis of foot 08/08/2019  . Obesity (BMI 30-39.9) 08/08/2019  . Wound infection 06/13/2019  . Cellulitis 07/17/2017  . Subacute osteomyelitis, right ankle and foot ([place])   . Toe amputation status, left 05/05/2017  . Gangrene of left foot ([place])   . PAD (peripheral artery disease) ([place]) 04/10/2017  . Essential hypertension 04/10/2017  . Chronic diastolic CHF (congestive heart failure) ([place]) 04/10/2017  . AKI (acute kidney injury) ([place]) 04/10/2017  . Foreign body in left foot   . Cellulitis and abscess of foot 04/07/2017  . Carotid stenosis, asymptomatic, bilateral 01/15/2017  . Visual disturbance 01/15/2017  . Tobacco abuse 01/15/2017  . Acute respiratory failure with hypoxia ([place]) 10/30/2016  . Pulmonary hypertension ([place]) 10/29/2016  . Acute diastolic CHF (congestive heart  failure) ([place]) 10/28/2016  . Benign essential HTN 10/28/2016  . Hyperlipidemia 10/28/2016  . Diabetic infection of right foot ([place]) 10/28/2016   Ihor Austin, LPTA/CLT; CBIS [phone]  Aldona Lento 02/18/2020, 3:33 PM  [facility] [address] [place], [place], [address] Phone: [phone]   Fax:  [phone]  Name: KATIANNE BARRE MRN: [id number] Date of Birth: 1966/04/21

## 2020-02-21 ENCOUNTER — Encounter (HOSPITAL_COMMUNITY): Payer: Self-pay | Admitting: Physical Therapy

## 2020-02-21 ENCOUNTER — Ambulatory Visit (HOSPITAL_COMMUNITY): Payer: Medicaid Other | Admitting: Physical Therapy

## 2020-02-21 ENCOUNTER — Other Ambulatory Visit: Payer: Self-pay

## 2020-02-21 DIAGNOSIS — M6281 Muscle weakness (generalized): Secondary | ICD-10-CM

## 2020-02-21 DIAGNOSIS — R2689 Other abnormalities of gait and mobility: Secondary | ICD-10-CM | POA: Diagnosis not present

## 2020-02-21 NOTE — Therapy (Signed)
[place], [place], [address] Phone: [phone]   Fax:  [phone]  Physical Therapy Treatment  Patient Details  Name: Natalie Contreras MRN: [id number] Date of Birth: 1965/11/07 Referring Provider (PT): Meridee Score MD   Encounter Date: 02/21/2020  PT End of Session - 02/21/20 1524    Visit Number  7    Number of Visits  16    Date for PT Re-Evaluation  03/17/20    Authorization Type  Medicaid    Authorization Time Period  3 visits 5/10-5/23, 11 visits approved 5/26-7/06    Authorization - Visit Number  3    Authorization - Number of Visits  11    Progress Note Due on Visit  16    PT Start Time  1520    PT Stop Time  1600    PT Time Calculation (min)  40 min    Equipment Utilized During Treatment  Gait belt    Activity Tolerance  Patient tolerated treatment well;Patient limited by fatigue   Pt c/o nausea, reports reduced at EOS.   Behavior During Therapy  [facility] for tasks assessed/performed       Past Medical History:  Diagnosis Date  . Abscess of foot 08/09/2019   WITH ULCER  RIGHT FOOT  . Acute diastolic CHF (congestive heart failure) ([place]) 10/28/2016  . Carotid stenosis, asymptomatic, bilateral 01/15/2017  . Diabetes mellitus without complication ([place])   . Heart murmur   . Hypertension   . PAD (peripheral artery disease) ([place]) 04/10/2017   Left ABI 0.74. Right ABI 0.96.    Past Surgical History:  Procedure Laterality Date  . ABDOMINAL AORTOGRAM W/LOWER EXTREMITY N/A 08/10/2019   Procedure: ABDOMINAL AORTOGRAM W/LOWER EXTREMITY;  Surgeon: Waynetta Sandy, MD;  Location: [place] [facility];  Service: Cardiovascular;  Laterality: N/A;  . AMPUTATION Right 09/03/2019   Procedure: RIGHT BELOW KNEE AMPUTATION;  Surgeon: Newt Minion, MD;  Location: [place];  Service: Orthopedics;  Laterality: Right;  . AMPUTATION TOE Left 05/05/2017   Procedure: LEFT SECOND TOE AMPUTATION;  Surgeon: Aviva Signs, MD;   Location: [facility];  Service: General;  Laterality: Left;  . CATARACT EXTRACTION W/PHACO Left 02/08/2016   Procedure: CATARACT EXTRACTION PHACO AND INTRAOCULAR LENS PLACEMENT (IOC);  Surgeon: Tonny Branch, MD;  Location: [facility];  Service: Ophthalmology;  Laterality: Left;  CDE 11.43   . CATARACT EXTRACTION W/PHACO Right 02/26/2016   Procedure: CATARACT EXTRACTION PHACO AND INTRAOCULAR LENS PLACEMENT RIGHT; CDE:  16.90;  Surgeon: Tonny Branch, MD;  Location: [facility];  Service: Ophthalmology;  Laterality: Right;  . CHOLECYSTECTOMY    . FOREIGN BODY REMOVAL Left 04/09/2017   Procedure: FOREIGN BODY REMOVAL ADULT FOOT;  Surgeon: Aviva Signs, MD;  Location: [facility];  Service: General;  Laterality: Left;  . I & D EXTREMITY Right 08/11/2019   Procedure: IRRIGATION AND DEBRIDEMENT EXTREMITY;  Surgeon: Newt Minion, MD;  Location: [place];  Service: Orthopedics;  Laterality: Right;  . PERIPHERAL VASCULAR ATHERECTOMY Right 08/10/2019   Procedure: PERIPHERAL VASCULAR ATHERECTOMY;  Surgeon: Waynetta Sandy, MD;  Location: [place] [facility];  Service: Cardiovascular;  Laterality: Right;  superficial femoral  . PERIPHERAL VASCULAR BALLOON ANGIOPLASTY Right 08/10/2019   Procedure: PERIPHERAL VASCULAR BALLOON ANGIOPLASTY;  Surgeon: Waynetta Sandy, MD;  Location: [place] [facility];  Service: Cardiovascular;  Laterality: Right;  anterior tibial  . TRANSMETATARSAL AMPUTATION Left 07/21/2017   Procedure: TRANSMETATARSAL AMPUTATION LEFT FOOT;  Surgeon: Arnoldo Morale,  Elta Guadeloupe, MD;  Location: [facility];  Service: General;  Laterality: Left;    There were no vitals filed for this visit.  Subjective Assessment - 02/21/20 1519    Subjective  Patient states nothing new to report. She is sore all over. She is usually sore after each session.    Patient Stated Goals  Just want to walk without assistance                        [facility] Adult PT Treatment/Exercise - 02/21/20 0001      Knee/Hip  Exercises: Standing   Hip Abduction  Both;2 sets;10 reps    Abduction Limitations  3" holds    Hip Extension  Both;2 sets;10 reps    SLS with Vectors  5x5" BLE with HHa    Other Standing Knee Exercises  tandem stance 2x30 seconds bilateral, semi tandem stance 2x 30"; sidestep 4RT inside // bars    Other Standing Knee Exercises  RTB rows standing 2X10 reps      Knee/Hip Exercises: Seated   Sit to Sand  5 reps;without UE support;3 sets   last set from blue pad on chair, eccentric control throughou            PT Education - 02/21/20 1522    Education Details  Patient educated on HEP, sit to stand from elevated surface, body mechanics of all exerices, posture    Person(s) Educated  Patient    Methods  Explanation;Demonstration    Comprehension  Verbalized understanding;Returned demonstration       PT Short Term Goals - 02/07/20 1450      PT SHORT TERM GOAL #1   Title  Patient will be independent with initial HEP and self-management strategies to improve functional outcomes    Baseline  02/07/20:  Reports compliance with HEP daily.    Status  On-going      PT SHORT TERM GOAL #2   Title  Patient will be able to perform stand x 5 in < 15 seconds to demonstrate improvement in functional mobility and reduced risk for falls.    Baseline  02/07/20:  HHA required 5xSTS 19.16"; more weight bearing with Lt LE    Status  On-going        PT Long Term Goals - 02/07/20 1550      PT LONG TERM GOAL #1   Title  Patient will have equal to or > 4/5 MMT throughout BLE to improve ability to perform functional mobility, stair ambulation and ADLs.    Baseline  02/07/20: see MMT    Time  8      PT LONG TERM GOAL #2   Title  Patient will be able to ambulate at least 250 feet during 2MWT with LRAD to demonstrate improved ability to perform functional mobility and associated tasks.    Baseline  02/07/20:  2MWT 82 feet; c/o clicking and too loose prostetic limb.  Has apt with [facility] tomorrow  02/08/20.      PT LONG TERM GOAL #3   Title  Patient will improve BERG score to >44 to indicate improvement in functional outcomes and reduced risk for falls    Baseline  02/07/20:  BERG score 26    Status  On-going            Plan - 02/21/20 1524    Clinical Impression Statement  Patient requires frequent verbal cuing for posture and reducing UE support with standing exercises. She  has difficulty and is apprehensive of standing on RLE with unilateral UE support despite frequent cueing and assist. She has severe difficulty with tandem stance and is unable to perform without UE support. She performs with moderately impaired balance in semi tandem position. She demonstrates very impaired static standing balance with row exercise and requires assist for balance despite cueing for posture and mechanics of exercise. She has difficulty with sit to stand exercise secondary to impaired LE strength and requires min assist to be able to complete with some unsteadiness upon standing. Patient educated on completing STS from elevated surface at home to complete with proper mechanics. Patient will continue to benefit from skilled physical therapy in order to reduce impairment and improve function.    Personal Factors and Comorbidities  Comorbidity 3+    Comorbidities  CHF, diabetes, BKA    Examination-Activity Limitations  Bathing;Lift;Stand;Bed Mobility;Locomotion Level;Bend;Transfers;Toileting;Reach Overhead;Caring for Others;Carry;Dressing;Squat;Hygiene/Grooming;Stairs    Examination-Participation Restrictions  Laundry;Community Activity;Cleaning;Meal Prep;Yard Work    Stability/Clinical Decision Making  Stable/Uncomplicated    Rehab Potential  Good    PT Frequency  2x / week    PT Duration  8 weeks    PT Treatment/Interventions  ADLs/Self Care Home Management;Aquatic Therapy;Biofeedback;Cryotherapy;Electrical Stimulation;Contrast Bath;Therapeutic exercise;Orthotic Fit/Training;Compression  bandaging;Taping;Vasopneumatic Device;Joint Manipulations;Spinal Manipulations;Scar mobilization;Passive range of motion;Prosthetic Training;Wheelchair mobility training;Balance training;DME Instruction;Iontophoresis 4mg /ml Dexamethasone;Neuromuscular re-education;Gait training;Stair training;Moist Heat;Traction;Functional mobility training;Cognitive remediation;Manual techniques;Dry needling;Energy conservation;Manual lymph drainage;Splinting;Patient/family education;Therapeutic activities;Ultrasound;Parrafin;Fluidtherapy    PT Next Visit Plan  Progress LE strength and balance as tolerated. Progress to standing static balance as able and gait with LRAD.    PT Home Exercise Plan  01/26/20:  single leg stance in walker beginning with 1 UE assist, sit to stands without UE, seated abdominal bracing, seated LAQ. 6/7 sit to stand       Patient will benefit from skilled therapeutic intervention in order to improve the following deficits and impairments:  Abnormal gait, Improper body mechanics, Impaired sensation, Decreased activity tolerance, Decreased endurance, Decreased range of motion, Decreased strength, Hypomobility, Decreased balance, Difficulty walking, Increased edema, Prosthetic Dependency  Visit Diagnosis: Muscle weakness (generalized)  Other abnormalities of gait and mobility     Problem List Patient Active Problem List   Diagnosis Date Noted  . Drug induced constipation   . Postoperative pain   . Morbid obesity ([place])   . Acute blood loss anemia   . Hypoalbuminemia due to protein-calorie malnutrition ([place])   . Stage 3 chronic kidney disease   . Diabetic peripheral neuropathy ([place])   . S/P BKA (below knee amputation) ([place]) 09/06/2019  . Cutaneous abscess of right foot   . Dehiscence of amputation stump ([place])   . 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 Severe protein-calorie malnutrition ([place])   . Gangrene of right foot ([place])   . Abscess or cellulitis of foot 08/08/2019  . Obesity  (BMI 30-39.9) 08/08/2019  . Wound infection 06/13/2019  . Cellulitis 07/17/2017  . Subacute osteomyelitis, right ankle and foot ([place])   . Toe amputation status, left 05/05/2017  . Gangrene of left foot ([place])   . PAD (peripheral artery disease) ([place]) 04/10/2017  . Essential hypertension 04/10/2017  . Chronic diastolic CHF (congestive heart failure) ([place]) 04/10/2017  . AKI (acute kidney injury) ([place]) 04/10/2017  . Foreign body in left foot   . Cellulitis and abscess of foot 04/07/2017  . Carotid stenosis, asymptomatic, bilateral 01/15/2017  . Visual disturbance 01/15/2017  . Tobacco abuse 01/15/2017  . Acute respiratory failure with hypoxia ([place]) 10/30/2016  . Pulmonary  hypertension ([place]) 10/29/2016  . Acute diastolic CHF (congestive heart failure) ([place]) 10/28/2016  . Benign essential HTN 10/28/2016  . Hyperlipidemia 10/28/2016  . Diabetic infection of right foot ([place]) 10/28/2016   4:06 PM, 02/21/20 Mearl Latin PT, DPT Physical Therapist at [facility] [place], [place], [address] Phone: [phone]   Fax:  [phone]  Name: Natalie Contreras MRN: [id number] Date of Birth: 1966/02/13

## 2020-02-22 ENCOUNTER — Encounter (HOSPITAL_COMMUNITY): Payer: Medicaid Other | Admitting: Physical Therapy

## 2020-02-23 ENCOUNTER — Other Ambulatory Visit: Payer: Self-pay

## 2020-02-23 ENCOUNTER — Encounter (HOSPITAL_COMMUNITY): Payer: Self-pay | Admitting: Physical Therapy

## 2020-02-23 ENCOUNTER — Ambulatory Visit (HOSPITAL_COMMUNITY): Payer: Medicaid Other | Admitting: Physical Therapy

## 2020-02-23 DIAGNOSIS — R2689 Other abnormalities of gait and mobility: Secondary | ICD-10-CM | POA: Diagnosis not present

## 2020-02-23 DIAGNOSIS — M6281 Muscle weakness (generalized): Secondary | ICD-10-CM

## 2020-02-23 NOTE — Therapy (Signed)
[place], [place], [address] Phone: [phone]   Fax:  [phone]  Physical Therapy Treatment  Patient Details  Name: Natalie Contreras MRN: [id number] Date of Birth: 01-13-1966 Referring Provider (PT): Meridee Score MD   Encounter Date: 02/23/2020  PT End of Session - 02/23/20 1532    Visit Number  8    Number of Visits  16    Date for PT Re-Evaluation  03/17/20    Authorization Type  Medicaid    Authorization Time Period  3 visits 5/10-5/23, 11 visits approved 5/26-7/06    Authorization - Visit Number  4    Authorization - Number of Visits  11    Progress Note Due on Visit  16    PT Start Time  1527    PT Stop Time  1607    PT Time Calculation (min)  40 min    Equipment Utilized During Treatment  Gait belt    Activity Tolerance  Patient tolerated treatment well;Patient limited by fatigue   Pt c/o nausea, reports reduced at EOS.   Behavior During Therapy  [facility] for tasks assessed/performed       Past Medical History:  Diagnosis Date  . Abscess of foot 08/09/2019   WITH ULCER  RIGHT FOOT  . Acute diastolic CHF (congestive heart failure) ([place]) 10/28/2016  . Carotid stenosis, asymptomatic, bilateral 01/15/2017  . Diabetes mellitus without complication ([place])   . Heart murmur   . Hypertension   . PAD (peripheral artery disease) ([place]) 04/10/2017   Left ABI 0.74. Right ABI 0.96.    Past Surgical History:  Procedure Laterality Date  . ABDOMINAL AORTOGRAM W/LOWER EXTREMITY N/A 08/10/2019   Procedure: ABDOMINAL AORTOGRAM W/LOWER EXTREMITY;  Surgeon: Waynetta Sandy, MD;  Location: [facility];  Service: Cardiovascular;  Laterality: N/A;  . AMPUTATION Right 09/03/2019   Procedure: RIGHT BELOW KNEE AMPUTATION;  Surgeon: Newt Minion, MD;  Location: [facility];  Service: Orthopedics;  Laterality: Right;  . AMPUTATION TOE Left 05/05/2017   Procedure: LEFT SECOND TOE AMPUTATION;  Surgeon: Aviva Signs, MD;   Location: [facility];  Service: General;  Laterality: Left;  . CATARACT EXTRACTION W/PHACO Left 02/08/2016   Procedure: CATARACT EXTRACTION PHACO AND INTRAOCULAR LENS PLACEMENT (IOC);  Surgeon: Tonny Branch, MD;  Location: [facility];  Service: Ophthalmology;  Laterality: Left;  CDE 11.43   . CATARACT EXTRACTION W/PHACO Right 02/26/2016   Procedure: CATARACT EXTRACTION PHACO AND INTRAOCULAR LENS PLACEMENT RIGHT; CDE:  16.90;  Surgeon: Tonny Branch, MD;  Location: [facility];  Service: Ophthalmology;  Laterality: Right;  . CHOLECYSTECTOMY    . FOREIGN BODY REMOVAL Left 04/09/2017   Procedure: FOREIGN BODY REMOVAL ADULT FOOT;  Surgeon: Aviva Signs, MD;  Location: [facility];  Service: General;  Laterality: Left;  . I & D EXTREMITY Right 08/11/2019   Procedure: IRRIGATION AND DEBRIDEMENT EXTREMITY;  Surgeon: Newt Minion, MD;  Location: [place];  Service: Orthopedics;  Laterality: Right;  . PERIPHERAL VASCULAR ATHERECTOMY Right 08/10/2019   Procedure: PERIPHERAL VASCULAR ATHERECTOMY;  Surgeon: Waynetta Sandy, MD;  Location: [facility];  Service: Cardiovascular;  Laterality: Right;  superficial femoral  . PERIPHERAL VASCULAR BALLOON ANGIOPLASTY Right 08/10/2019   Procedure: PERIPHERAL VASCULAR BALLOON ANGIOPLASTY;  Surgeon: Waynetta Sandy, MD;  Location: [facility];  Service: Cardiovascular;  Laterality: Right;  anterior tibial  . TRANSMETATARSAL AMPUTATION Left 07/21/2017   Procedure: TRANSMETATARSAL AMPUTATION LEFT FOOT;  Surgeon: Arnoldo Morale,  Elta Guadeloupe, MD;  Location: [facility];  Service: General;  Laterality: Left;    There were no vitals filed for this visit.  Subjective Assessment - 02/23/20 1531    Subjective  Patient states she is feeling good. She was exhausted after last session. Home exercises are going well.    Patient Stated Goals  Just want to walk without assistance    Currently in Pain?  No/denies                        [facility] Adult PT Treatment/Exercise -  02/23/20 0001      Ambulation/Gait   Gait Comments  attempted gait training with SPC, unable to do with unilateral support - discontinued for today      Knee/Hip Exercises: Standing   Hip Abduction  Both;2 sets;10 reps    Abduction Limitations  3" holds; 2#    Hip Extension  Both;2 sets;10 reps    Extension Limitations  2#    Other Standing Knee Exercises  standing weight shifts anterior and posterior with each foot in back and lateral - 5 minutes      Knee/Hip Exercises: Seated   Sit to Sand  5 reps;without UE support   on blue foam; 6 sets            PT Education - 02/23/20 1531    Education Details  Patient educated on HEP, exercise mechanics    Person(s) Educated  Patient    Methods  Explanation;Demonstration    Comprehension  Verbalized understanding;Returned demonstration       PT Short Term Goals - 02/07/20 1450      PT SHORT TERM GOAL #1   Title  Patient will be independent with initial HEP and self-management strategies to improve functional outcomes    Baseline  02/07/20:  Reports compliance with HEP daily.    Status  On-going      PT SHORT TERM GOAL #2   Title  Patient will be able to perform stand x 5 in < 15 seconds to demonstrate improvement in functional mobility and reduced risk for falls.    Baseline  02/07/20:  HHA required 5xSTS 19.16"; more weight bearing with Lt LE    Status  On-going        PT Long Term Goals - 02/07/20 1550      PT LONG TERM GOAL #1   Title  Patient will have equal to or > 4/5 MMT throughout BLE to improve ability to perform functional mobility, stair ambulation and ADLs.    Baseline  02/07/20: see MMT    Time  8      PT LONG TERM GOAL #2   Title  Patient will be able to ambulate at least 250 feet during 2MWT with LRAD to demonstrate improved ability to perform functional mobility and associated tasks.    Baseline  02/07/20:  2MWT 82 feet; c/o clicking and too loose prostetic limb.  Has apt with [facility] tomorrow  02/08/20.      PT LONG TERM GOAL #3   Title  Patient will improve BERG score to >44 to indicate improvement in functional outcomes and reduced risk for falls    Baseline  02/07/20:  BERG score 26    Status  On-going            Plan - 02/23/20 1532    Clinical Impression Statement  Patient able to complete hip exercises with added weights today and completes with  improved mechanics and posture. Patient has difficulty weight bearing through RLE with SPC due to hesitance of weight acceptance despite unilateral UE support. She requires bilateral UE support for weight shifting in parallel bars and has most difficulty with lateral shifting. She is unable to complete with unilateral support despite cueing and guard/assist. She performs increased reps of sit to stand today from elevated surface. Patient educated on possible pro bono PT services at [facility] upon discharge. Patient will benefit from skilled physical therapy in order to improve function and reduce impairment.    Personal Factors and Comorbidities  Comorbidity 3+    Comorbidities  CHF, diabetes, BKA    Examination-Activity Limitations  Bathing;Lift;Stand;Bed Mobility;Locomotion Level;Bend;Transfers;Toileting;Reach Overhead;Caring for Others;Carry;Dressing;Squat;Hygiene/Grooming;Stairs    Examination-Participation Restrictions  Laundry;Community Activity;Cleaning;Meal Prep;Yard Work    Stability/Clinical Decision Making  Stable/Uncomplicated    Rehab Potential  Good    PT Frequency  2x / week    PT Duration  8 weeks    PT Treatment/Interventions  ADLs/Self Care Home Management;Aquatic Therapy;Biofeedback;Cryotherapy;Electrical Stimulation;Contrast Bath;Therapeutic exercise;Orthotic Fit/Training;Compression bandaging;Taping;Vasopneumatic Device;Joint Manipulations;Spinal Manipulations;Scar mobilization;Passive range of motion;Prosthetic Training;Wheelchair mobility training;Balance training;DME Instruction;Iontophoresis 4mg /ml  Dexamethasone;Neuromuscular re-education;Gait training;Stair training;Moist Heat;Traction;Functional mobility training;Cognitive remediation;Manual techniques;Dry needling;Energy conservation;Manual lymph drainage;Splinting;Patient/family education;Therapeutic activities;Ultrasound;Parrafin;Fluidtherapy    PT Next Visit Plan  Progress LE strength and balance as tolerated. Progress to standing static balance as able and gait with LRAD.    PT Home Exercise Plan  01/26/20:  single leg stance in walker beginning with 1 UE assist, sit to stands without UE, seated abdominal bracing, seated LAQ. 6/7 sit to stand       Patient will benefit from skilled therapeutic intervention in order to improve the following deficits and impairments:  Abnormal gait, Improper body mechanics, Impaired sensation, Decreased activity tolerance, Decreased endurance, Decreased range of motion, Decreased strength, Hypomobility, Decreased balance, Difficulty walking, Increased edema, Prosthetic Dependency  Visit Diagnosis: Muscle weakness (generalized)  Other abnormalities of gait and mobility     Problem List Patient Active Problem List   Diagnosis Date Noted  . Drug induced constipation   . Postoperative pain   . Morbid obesity ([place])   . Acute blood loss anemia   . Hypoalbuminemia due to protein-calorie malnutrition ([place])   . Stage 3 chronic kidney disease   . Diabetic peripheral neuropathy ([place])   . S/P BKA (below knee amputation) ([place]) 09/06/2019  . Cutaneous abscess of right foot   . Dehiscence of amputation stump ([place])   . 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 Severe protein-calorie malnutrition ([place])   . Gangrene of right foot ([place])   . Abscess or cellulitis of foot 08/08/2019  . Obesity (BMI 30-39.9) 08/08/2019  . Wound infection 06/13/2019  . Cellulitis 07/17/2017  . Subacute osteomyelitis, right ankle and foot ([place])   . Toe amputation status, left 05/05/2017  . Gangrene of left foot ([place])   . PAD  (peripheral artery disease) ([place]) 04/10/2017  . Essential hypertension 04/10/2017  . Chronic diastolic CHF (congestive heart failure) ([place]) 04/10/2017  . AKI (acute kidney injury) ([place]) 04/10/2017  . Foreign body in left foot   . Cellulitis and abscess of foot 04/07/2017  . Carotid stenosis, asymptomatic, bilateral 01/15/2017  . Visual disturbance 01/15/2017  . Tobacco abuse 01/15/2017  . Acute respiratory failure with hypoxia ([place]) 10/30/2016  . Pulmonary hypertension ([place]) 10/29/2016  . Acute diastolic CHF (congestive heart failure) ([place]) 10/28/2016  . Benign essential HTN 10/28/2016  . Hyperlipidemia 10/28/2016  . Diabetic infection of right foot ([place])  10/28/2016    4:13 PM, 02/23/20 Mearl Latin PT, DPT Physical Therapist at [place], [place], [address] Phone: [phone]   Fax:  [phone]  Name: Natalie Contreras MRN: [id number] Date of Birth: Jan 22, 1966

## 2020-02-24 ENCOUNTER — Encounter (HOSPITAL_COMMUNITY): Payer: Medicaid Other | Admitting: Physical Therapy

## 2020-02-28 ENCOUNTER — Encounter (HOSPITAL_COMMUNITY): Payer: Medicaid Other | Admitting: Physical Therapy

## 2020-02-28 ENCOUNTER — Encounter (HOSPITAL_COMMUNITY): Payer: Self-pay | Admitting: Physical Therapy

## 2020-02-28 ENCOUNTER — Ambulatory Visit (HOSPITAL_COMMUNITY): Payer: Medicaid Other | Admitting: Physical Therapy

## 2020-02-28 ENCOUNTER — Other Ambulatory Visit: Payer: Self-pay

## 2020-02-28 DIAGNOSIS — R2689 Other abnormalities of gait and mobility: Secondary | ICD-10-CM | POA: Diagnosis not present

## 2020-02-28 DIAGNOSIS — M6281 Muscle weakness (generalized): Secondary | ICD-10-CM

## 2020-02-28 DIAGNOSIS — Z89519 Acquired absence of unspecified leg below knee: Secondary | ICD-10-CM

## 2020-02-28 NOTE — Therapy (Signed)
[place], [place], [address] Phone: [phone]   Fax:  [phone]  Physical Therapy Treatment  Patient Details  Name: Natalie Contreras MRN: [id number] Date of Birth: 10/11/1965 Referring Provider (PT): Meridee Score MD   Encounter Date: 02/28/2020   PT End of Session - 02/28/20 1522    Visit Number 9    Number of Visits 16    Date for PT Re-Evaluation 03/17/20    Authorization Type Medicaid    Authorization Time Period 3 visits 5/10-5/23, 11 visits approved 5/26-7/06    Authorization - Visit Number 5    Authorization - Number of Visits 11    Progress Note Due on Visit 16    PT Start Time 1519    PT Stop Time 1550    PT Time Calculation (min) 31 min    Equipment Utilized During Treatment Gait belt    Activity Tolerance Patient tolerated treatment well;Patient limited by fatigue    Behavior During Therapy [facility] for tasks assessed/performed           Past Medical History:  Diagnosis Date  . Abscess of foot 08/09/2019   WITH ULCER  RIGHT FOOT  . Acute diastolic CHF (congestive heart failure) ([place]) 10/28/2016  . Carotid stenosis, asymptomatic, bilateral 01/15/2017  . Diabetes mellitus without complication ([place])   . Heart murmur   . Hypertension   . PAD (peripheral artery disease) ([place]) 04/10/2017   Left ABI 0.74. Right ABI 0.96.    Past Surgical History:  Procedure Laterality Date  . ABDOMINAL AORTOGRAM W/LOWER EXTREMITY N/A 08/10/2019   Procedure: ABDOMINAL AORTOGRAM W/LOWER EXTREMITY;  Surgeon: Waynetta Sandy, MD;  Location: [facility];  Service: Cardiovascular;  Laterality: N/A;  . AMPUTATION Right 09/03/2019   Procedure: RIGHT BELOW KNEE AMPUTATION;  Surgeon: Newt Minion, MD;  Location: [facility];  Service: Orthopedics;  Laterality: Right;  . AMPUTATION TOE Left 05/05/2017   Procedure: LEFT SECOND TOE AMPUTATION;  Surgeon: Aviva Signs, MD;  Location: [facility];  Service: General;   Laterality: Left;  . CATARACT EXTRACTION W/PHACO Left 02/08/2016   Procedure: CATARACT EXTRACTION PHACO AND INTRAOCULAR LENS PLACEMENT (IOC);  Surgeon: Tonny Branch, MD;  Location: [facility];  Service: Ophthalmology;  Laterality: Left;  CDE 11.43   . CATARACT EXTRACTION W/PHACO Right 02/26/2016   Procedure: CATARACT EXTRACTION PHACO AND INTRAOCULAR LENS PLACEMENT RIGHT; CDE:  16.90;  Surgeon: Tonny Branch, MD;  Location: [facility];  Service: Ophthalmology;  Laterality: Right;  . CHOLECYSTECTOMY    . FOREIGN BODY REMOVAL Left 04/09/2017   Procedure: FOREIGN BODY REMOVAL ADULT FOOT;  Surgeon: Aviva Signs, MD;  Location: [facility];  Service: General;  Laterality: Left;  . I & D EXTREMITY Right 08/11/2019   Procedure: IRRIGATION AND DEBRIDEMENT EXTREMITY;  Surgeon: Newt Minion, MD;  Location: [place] [facility];  Service: Orthopedics;  Laterality: Right;  . PERIPHERAL VASCULAR ATHERECTOMY Right 08/10/2019   Procedure: PERIPHERAL VASCULAR ATHERECTOMY;  Surgeon: Waynetta Sandy, MD;  Location: [facility];  Service: Cardiovascular;  Laterality: Right;  superficial femoral  . PERIPHERAL VASCULAR BALLOON ANGIOPLASTY Right 08/10/2019   Procedure: PERIPHERAL VASCULAR BALLOON ANGIOPLASTY;  Surgeon: Waynetta Sandy, MD;  Location: [facility];  Service: Cardiovascular;  Laterality: Right;  anterior tibial  . TRANSMETATARSAL AMPUTATION Left 07/21/2017   Procedure: TRANSMETATARSAL AMPUTATION LEFT FOOT;  Surgeon: Aviva Signs, MD;  Location: [facility];  Service: General;  Laterality: Left;    There were  no vitals filed for this visit.   Subjective Assessment - 02/28/20 1523    Subjective Patient says she has been doing home exercise. Says she is still having trouble with balancing. Says she is getting better with sit to stands    Patient Stated Goals Just want to walk without assistance    Currently in Pain? No/denies                             [facility] Adult PT Treatment/Exercise  - 02/28/20 0001      Knee/Hip Exercises: Standing   Knee Flexion Both;2 sets;10 reps    Knee Flexion Limitations 2#    Hip Abduction Both;2 sets;10 reps    Abduction Limitations 2#    Hip Extension Both;2 sets;10 reps    Extension Limitations 2#    Forward Step Up Both;1 set;15 reps;Hand Hold: 1;Step Height: 2"    Gait Training 175 feet with RW, cues for upright posture and step length     Other Standing Knee Exercises sidestepping in // bars 3RT      Knee/Hip Exercises: Seated   Sit to Sand 2 sets;10 reps;with UE support   elevated seat using blue foam                    PT Short Term Goals - 02/07/20 1450      PT SHORT TERM GOAL #1   Title Patient will be independent with initial HEP and self-management strategies to improve functional outcomes    Baseline 02/07/20:  Reports compliance with HEP daily.    Status On-going      PT SHORT TERM GOAL #2   Title Patient will be able to perform stand x 5 in < 15 seconds to demonstrate improvement in functional mobility and reduced risk for falls.    Baseline 02/07/20:  HHA required 5xSTS 19.16"; more weight bearing with Lt LE    Status On-going             PT Long Term Goals - 02/07/20 1550      PT LONG TERM GOAL #1   Title Patient will have equal to or > 4/5 MMT throughout BLE to improve ability to perform functional mobility, stair ambulation and ADLs.    Baseline 02/07/20: see MMT    Time 8      PT LONG TERM GOAL #2   Title Patient will be able to ambulate at least 250 feet during 2MWT with LRAD to demonstrate improved ability to perform functional mobility and associated tasks.    Baseline 02/07/20:  2MWT 82 feet; c/o clicking and too loose prostetic limb.  Has apt with [facility] tomorrow 02/08/20.      PT LONG TERM GOAL #3   Title Patient will improve BERG score to >44 to indicate improvement in functional outcomes and reduced risk for falls    Baseline 02/07/20:  BERG score 26    Status On-going                  Plan - 02/28/20 1607    Clinical Impression Statement Patient showing good progress toward therapy goals. Ther ex abbreviated today per patient request due to time constraint with following appointment time for covid shot. Patient able to progress functional mobility and stair ambulation today, but requires verbal cues for proper weight shift and sequencing. Patient was very challenged with sit to stands and required elevated seat height to  perform due to LE weakness. Patient with noted fatigue during gait training and required standing rest breaks x 2. Patient will continue to benefit from skilled therapy services to progress LE strength and balance to improve functional mobility.    Personal Factors and Comorbidities Comorbidity 3+    Comorbidities CHF, diabetes, BKA    Examination-Activity Limitations Bathing;Lift;Stand;Bed Mobility;Locomotion Level;Bend;Transfers;Toileting;Reach Overhead;Caring for Others;Carry;Dressing;Squat;Hygiene/Grooming;Stairs    Examination-Participation Restrictions Laundry;Community Activity;Cleaning;Meal Prep;Yard Work    Stability/Clinical Decision Making Stable/Uncomplicated    Rehab Potential Good    PT Frequency 2x / week    PT Duration 8 weeks    PT Treatment/Interventions ADLs/Self Care Home Management;Aquatic Therapy;Biofeedback;Cryotherapy;Electrical Stimulation;Contrast Bath;Therapeutic exercise;Orthotic Fit/Training;Compression bandaging;Taping;Vasopneumatic Device;Joint Manipulations;Spinal Manipulations;Scar mobilization;Passive range of motion;Prosthetic Training;Wheelchair mobility training;Balance training;DME Instruction;Iontophoresis 4mg /ml Dexamethasone;Neuromuscular re-education;Gait training;Stair training;Moist Heat;Traction;Functional mobility training;Cognitive remediation;Manual techniques;Dry needling;Energy conservation;Manual lymph drainage;Splinting;Patient/family education;Therapeutic activities;Ultrasound;Parrafin;Fluidtherapy     PT Next Visit Plan Progress LE strength and balance as tolerated. Continue working on gait and stair ambulation    PT Home Exercise Plan 01/26/20:  single leg stance in walker beginning with 1 UE assist, sit to stands without UE, seated abdominal bracing, seated LAQ. 6/7 sit to stand    Consulted and Agree with Plan of Care Patient           Patient will benefit from skilled therapeutic intervention in order to improve the following deficits and impairments:  Abnormal gait, Improper body mechanics, Impaired sensation, Decreased activity tolerance, Decreased endurance, Decreased range of motion, Decreased strength, Hypomobility, Decreased balance, Difficulty walking, Increased edema, Prosthetic Dependency  Visit Diagnosis: Muscle weakness (generalized)  Other abnormalities of gait and mobility  Status post below knee amputation, unspecified laterality [facility])     Problem List Patient Active Problem List   Diagnosis Date Noted  . Drug induced constipation   . Postoperative pain   . Morbid obesity ([place])   . Acute blood loss anemia   . Hypoalbuminemia due to protein-calorie malnutrition ([place])   . Stage 3 chronic kidney disease   . Diabetic peripheral neuropathy ([place])   . S/P BKA (below knee amputation) ([place]) 09/06/2019  . Cutaneous abscess of right foot   . Dehiscence of amputation stump ([place])   . 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 Severe protein-calorie malnutrition ([place])   . Gangrene of right foot ([place])   . Abscess or cellulitis of foot 08/08/2019  . Obesity (BMI 30-39.9) 08/08/2019  . Wound infection 06/13/2019  . Cellulitis 07/17/2017  . Subacute osteomyelitis, right ankle and foot ([place])   . Toe amputation status, left 05/05/2017  . Gangrene of left foot ([place])   . PAD (peripheral artery disease) ([place]) 04/10/2017  . Essential hypertension 04/10/2017  . Chronic diastolic CHF (congestive heart failure) ([place]) 04/10/2017  . AKI (acute kidney injury) ([place]) 04/10/2017  .  Foreign body in left foot   . Cellulitis and abscess of foot 04/07/2017  . Carotid stenosis, asymptomatic, bilateral 01/15/2017  . Visual disturbance 01/15/2017  . Tobacco abuse 01/15/2017  . Acute respiratory failure with hypoxia ([place]) 10/30/2016  . Pulmonary hypertension ([place]) 10/29/2016  . Acute diastolic CHF (congestive heart failure) ([place]) 10/28/2016  . Benign essential HTN 10/28/2016  . Hyperlipidemia 10/28/2016  . Diabetic infection of right foot ([place]) 10/28/2016    4:20 PM, 02/28/20 Josue Hector PT DPT  Physical Therapist with [facility]  [phone] [place] [address] [place], [place], [address] Phone: [phone]   Fax:  [phone]  Name: Natalie Contreras MRN: [id number] Date of Birth: 05-06-1966

## 2020-03-01 ENCOUNTER — Telehealth (HOSPITAL_COMMUNITY): Payer: Self-pay | Admitting: Physical Therapy

## 2020-03-01 ENCOUNTER — Ambulatory Visit (HOSPITAL_COMMUNITY): Payer: Medicaid Other | Admitting: Physical Therapy

## 2020-03-01 ENCOUNTER — Encounter (HOSPITAL_COMMUNITY): Payer: Self-pay | Admitting: Physical Therapy

## 2020-03-01 ENCOUNTER — Encounter (HOSPITAL_COMMUNITY): Payer: Medicaid Other | Admitting: Physical Therapy

## 2020-03-01 ENCOUNTER — Other Ambulatory Visit: Payer: Self-pay

## 2020-03-01 DIAGNOSIS — R2689 Other abnormalities of gait and mobility: Secondary | ICD-10-CM | POA: Diagnosis not present

## 2020-03-01 DIAGNOSIS — M6281 Muscle weakness (generalized): Secondary | ICD-10-CM

## 2020-03-01 DIAGNOSIS — Z89519 Acquired absence of unspecified leg below knee: Secondary | ICD-10-CM

## 2020-03-01 NOTE — Therapy (Signed)
[place], [place], [address] Phone: [phone]   Fax:  [phone]  Physical Therapy Treatment  Patient Details  Name: Natalie Contreras MRN: [id number] Date of Birth: 24-Apr-1966 Referring Provider (PT): Meridee Score MD   Encounter Date: 03/01/2020   PT End of Session - 03/01/20 1524    Visit Number 10    Number of Visits 16    Date for PT Re-Evaluation 03/17/20    Authorization Type Medicaid    Authorization Time Period 3 visits 5/10-5/23, 11 visits approved 5/26-7/06    Authorization - Visit Number 6    Authorization - Number of Visits 11    Progress Note Due on Visit 16    PT Start Time 1520    PT Stop Time 1600    PT Time Calculation (min) 40 min    Equipment Utilized During Treatment Gait belt    Activity Tolerance Patient tolerated treatment well    Behavior During Therapy [facility] for tasks assessed/performed           Past Medical History:  Diagnosis Date  . Abscess of foot 08/09/2019   WITH ULCER  RIGHT FOOT  . Acute diastolic CHF (congestive heart failure) ([place]) 10/28/2016  . Carotid stenosis, asymptomatic, bilateral 01/15/2017  . Diabetes mellitus without complication ([place])   . Heart murmur   . Hypertension   . PAD (peripheral artery disease) ([place]) 04/10/2017   Left ABI 0.74. Right ABI 0.96.    Past Surgical History:  Procedure Laterality Date  . ABDOMINAL AORTOGRAM W/LOWER EXTREMITY N/A 08/10/2019   Procedure: ABDOMINAL AORTOGRAM W/LOWER EXTREMITY;  Surgeon: Waynetta Sandy, MD;  Location: [facility];  Service: Cardiovascular;  Laterality: N/A;  . AMPUTATION Right 09/03/2019   Procedure: RIGHT BELOW KNEE AMPUTATION;  Surgeon: Newt Minion, MD;  Location: [place];  Service: Orthopedics;  Laterality: Right;  . AMPUTATION TOE Left 05/05/2017   Procedure: LEFT SECOND TOE AMPUTATION;  Surgeon: Aviva Signs, MD;  Location: [facility];  Service: General;  Laterality: Left;  . CATARACT  EXTRACTION W/PHACO Left 02/08/2016   Procedure: CATARACT EXTRACTION PHACO AND INTRAOCULAR LENS PLACEMENT (IOC);  Surgeon: Tonny Branch, MD;  Location: [facility];  Service: Ophthalmology;  Laterality: Left;  CDE 11.43   . CATARACT EXTRACTION W/PHACO Right 02/26/2016   Procedure: CATARACT EXTRACTION PHACO AND INTRAOCULAR LENS PLACEMENT RIGHT; CDE:  16.90;  Surgeon: Tonny Branch, MD;  Location: [facility];  Service: Ophthalmology;  Laterality: Right;  . CHOLECYSTECTOMY    . FOREIGN BODY REMOVAL Left 04/09/2017   Procedure: FOREIGN BODY REMOVAL ADULT FOOT;  Surgeon: Aviva Signs, MD;  Location: [facility];  Service: General;  Laterality: Left;  . I & D EXTREMITY Right 08/11/2019   Procedure: IRRIGATION AND DEBRIDEMENT EXTREMITY;  Surgeon: Newt Minion, MD;  Location: [facility];  Service: Orthopedics;  Laterality: Right;  . PERIPHERAL VASCULAR ATHERECTOMY Right 08/10/2019   Procedure: PERIPHERAL VASCULAR ATHERECTOMY;  Surgeon: Waynetta Sandy, MD;  Location: [facility];  Service: Cardiovascular;  Laterality: Right;  superficial femoral  . PERIPHERAL VASCULAR BALLOON ANGIOPLASTY Right 08/10/2019   Procedure: PERIPHERAL VASCULAR BALLOON ANGIOPLASTY;  Surgeon: Waynetta Sandy, MD;  Location: [facility];  Service: Cardiovascular;  Laterality: Right;  anterior tibial  . TRANSMETATARSAL AMPUTATION Left 07/21/2017   Procedure: TRANSMETATARSAL AMPUTATION LEFT FOOT;  Surgeon: Aviva Signs, MD;  Location: [facility];  Service: General;  Laterality: Left;    There were no vitals filed  for this visit.   Subjective Assessment - 03/01/20 1523    Subjective Patient says she was a little sore the evening of last appointment but has been doing well otherwise. Reports no pain currently. Been doing alright walking with walker.    Patient Stated Goals Just want to walk without assistance    Currently in Pain? No/denies                             [facility] Adult PT Treatment/Exercise -  03/01/20 0001      Knee/Hip Exercises: Standing   Knee Flexion Both;2 sets;10 reps    Knee Flexion Limitations 2#    Hip Abduction Both;2 sets;10 reps    Abduction Limitations 2#    Hip Extension Both;2 sets;10 reps    Extension Limitations 2#    Lateral Step Up Both;1 set;10 reps;Hand Hold: 2;Step Height: 4"    Forward Step Up Both;1 set;10 reps;Hand Hold: 2;Step Height: 4"    Gait Training 6 RT in // bars using only LT UE     Other Standing Knee Exercises sidestepping in // bars 5RT    Other Standing Knee Exercises semi tandem 2 x 30" each on solid floor       Knee/Hip Exercises: Seated   Sit to Sand 2 sets;10 reps;with UE support   with min tactile cue for momentum from standard chair                   PT Short Term Goals - 02/07/20 1450      PT SHORT TERM GOAL #1   Title Patient will be independent with initial HEP and self-management strategies to improve functional outcomes    Baseline 02/07/20:  Reports compliance with HEP daily.    Status On-going      PT SHORT TERM GOAL #2   Title Patient will be able to perform stand x 5 in < 15 seconds to demonstrate improvement in functional mobility and reduced risk for falls.    Baseline 02/07/20:  HHA required 5xSTS 19.16"; more weight bearing with Lt LE    Status On-going             PT Long Term Goals - 02/07/20 1550      PT LONG TERM GOAL #1   Title Patient will have equal to or > 4/5 MMT throughout BLE to improve ability to perform functional mobility, stair ambulation and ADLs.    Baseline 02/07/20: see MMT    Time 8      PT LONG TERM GOAL #2   Title Patient will be able to ambulate at least 250 feet during 2MWT with LRAD to demonstrate improved ability to perform functional mobility and associated tasks.    Baseline 02/07/20:  2MWT 82 feet; c/o clicking and too loose prostetic limb.  Has apt [facility] tomorrow 02/08/20.      PT LONG TERM GOAL #3   Title Patient will improve BERG score to >44 to  indicate improvement in functional outcomes and reduced risk for falls    Baseline 02/07/20:  BERG score 26    Status On-going                 Plan - 03/01/20 1611    Clinical Impression Statement Patient making good progress to therapy goals. Patient shows improvement in functional mobility and was able to progress step height to 6-inch step ups. Patient also showed improvement  with sit to stands from standard chair height, but required verbal and tactile cues. Patient noted increased LE fatigue. Patient able to progress gait training to single UE assist in parallel bars in effort to transition to gait training with quad cane next session. Patient will come to benefit from skilled therapy services to progress LE strength and balance to improve functional mobility.    Personal Factors and Comorbidities Comorbidity 3+    Comorbidities CHF, diabetes, BKA    Examination-Activity Limitations Bathing;Lift;Stand;Bed Mobility;Locomotion Level;Bend;Transfers;Toileting;Reach Overhead;Caring for Others;Carry;Dressing;Squat;Hygiene/Grooming;Stairs    Examination-Participation Restrictions Laundry;Community Activity;Cleaning;Meal Prep;Yard Work    Stability/Clinical Decision Making Stable/Uncomplicated    Rehab Potential Good    PT Frequency 2x / week    PT Duration 8 weeks    PT Treatment/Interventions ADLs/Self Care Home Management;Aquatic Therapy;Biofeedback;Cryotherapy;Electrical Stimulation;Contrast Bath;Therapeutic exercise;Orthotic Fit/Training;Compression bandaging;Taping;Vasopneumatic Device;Joint Manipulations;Spinal Manipulations;Scar mobilization;Passive range of motion;Prosthetic Training;Wheelchair mobility training;Balance training;DME Instruction;Iontophoresis 4mg /ml Dexamethasone;Neuromuscular re-education;Gait training;Stair training;Moist Heat;Traction;Functional mobility training;Cognitive remediation;Manual techniques;Dry needling;Energy conservation;Manual lymph  drainage;Splinting;Patient/family education;Therapeutic activities;Ultrasound;Parrafin;Fluidtherapy    PT Next Visit Plan Progress LE strength and balance as tolerated. Continue working on gait and stair ambulation. Try gait with quad cane when able.    PT Home Exercise Plan 01/26/20:  single leg stance in walker beginning with 1 UE assist, sit to stands without UE, seated abdominal bracing, seated LAQ. 6/7 sit to stand    Consulted and Agree with Plan of Care Patient           Patient will benefit from skilled therapeutic intervention in order to improve the following deficits and impairments:  Abnormal gait, Improper body mechanics, Impaired sensation, Decreased activity tolerance, Decreased endurance, Decreased range of motion, Decreased strength, Hypomobility, Decreased balance, Difficulty walking, Increased edema, Prosthetic Dependency  Visit Diagnosis: Muscle weakness (generalized)  Other abnormalities of gait and mobility  Status post below knee amputation, unspecified laterality [facility])     Problem List Patient Active Problem List   Diagnosis Date Noted  . Drug induced constipation   . Postoperative pain   . Morbid obesity ([place])   . Acute blood loss anemia   . Hypoalbuminemia due to protein-calorie malnutrition ([place])   . Stage 3 chronic kidney disease   . Diabetic peripheral neuropathy ([place])   . S/P BKA (below knee amputation) ([place]) 09/06/2019  . Cutaneous abscess of right foot   . Dehiscence of amputation stump ([place])   . 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 Severe protein-calorie malnutrition ([place])   . Gangrene of right foot ([place])   . Abscess or cellulitis of foot 08/08/2019  . Obesity (BMI 30-39.9) 08/08/2019  . Wound infection 06/13/2019  . Cellulitis 07/17/2017  . Subacute osteomyelitis, right ankle and foot ([place])   . Toe amputation status, left 05/05/2017  . Gangrene of left foot ([place])   . PAD (peripheral artery disease) ([place]) 04/10/2017  . Essential  hypertension 04/10/2017  . Chronic diastolic CHF (congestive heart failure) ([place]) 04/10/2017  . AKI (acute kidney injury) ([place]) 04/10/2017  . Foreign body in left foot   . Cellulitis and abscess of foot 04/07/2017  . Carotid stenosis, asymptomatic, bilateral 01/15/2017  . Visual disturbance 01/15/2017  . Tobacco abuse 01/15/2017  . Acute respiratory failure with hypoxia ([place]) 10/30/2016  . Pulmonary hypertension ([place]) 10/29/2016  . Acute diastolic CHF (congestive heart failure) ([place]) 10/28/2016  . Benign essential HTN 10/28/2016  . Hyperlipidemia 10/28/2016  . Diabetic infection of right foot ([place]) 10/28/2016    4:24 PM, 03/01/20 Josue Hector PT DPT  Physical Therapist with  [facility]  [phone] [place] [address] [place], [place], [address] Phone: [phone]   Fax:  [phone]  Name: CARLEY GLENDENNING MRN: [id number] Date of Birth: Aug 26, 1966

## 2020-03-01 NOTE — Telephone Encounter (Signed)
Patient will be on Vacation next week

## 2020-03-06 ENCOUNTER — Encounter (HOSPITAL_COMMUNITY): Payer: Medicaid Other | Admitting: Physical Therapy

## 2020-03-07 ENCOUNTER — Encounter (HOSPITAL_COMMUNITY): Payer: Medicaid Other | Admitting: Physical Therapy

## 2020-03-08 ENCOUNTER — Encounter (HOSPITAL_COMMUNITY): Payer: Medicaid Other | Admitting: Physical Therapy

## 2020-03-09 ENCOUNTER — Encounter (HOSPITAL_COMMUNITY): Payer: Medicaid Other | Admitting: Physical Therapy

## 2020-03-13 ENCOUNTER — Telehealth (HOSPITAL_COMMUNITY): Payer: Self-pay | Admitting: Physical Therapy

## 2020-03-13 ENCOUNTER — Ambulatory Visit (HOSPITAL_COMMUNITY): Payer: Medicaid Other | Admitting: Physical Therapy

## 2020-03-13 NOTE — Telephone Encounter (Signed)
No Show. Attempted to leave message but voicemail did not pick up. Unable to leave message at this time.  4:50 PM, 03/13/20 Jerene Pitch, DPT Physical Therapy with [facility]  [phone] office

## 2020-03-15 ENCOUNTER — Ambulatory Visit (HOSPITAL_COMMUNITY): Payer: Medicaid Other | Admitting: Physical Therapy

## 2020-04-05 ENCOUNTER — Encounter: Payer: Self-pay | Admitting: Surgery

## 2020-09-05 ENCOUNTER — Encounter (HOSPITAL_COMMUNITY): Payer: Self-pay | Admitting: Physical Therapy

## 2020-09-05 NOTE — Therapy (Signed)
[place], [place], [address] Phone: [phone]   Fax:  [phone]  Patient Details  Name: Natalie Contreras MRN: [id number] Date of Birth: 02-01-1966 Referring Provider:  No ref. provider found  PHYSICAL THERAPY DISCHARGE SUMMARY  Visits from Start of Care: 10  Current functional level related to goals / functional outcomes: NA   Remaining deficits: NA  Education / Equipment: Patient did not return for follow up visits. Patient DC from therapy  Plan:                                                    Patient goals were partially met. Patient is being discharged due to not returning since the last visit.  ?????        4:11 PM, 09/05/20 Josue Hector PT DPT  Physical Therapist with [facility]  [phone] [place] [address] [place], [place], [address] Phone: [phone]   Fax:  [phone]

## 2020-12-15 ENCOUNTER — Other Ambulatory Visit (HOSPITAL_COMMUNITY): Payer: Self-pay | Admitting: Internal Medicine

## 2020-12-15 DIAGNOSIS — I5032 Chronic diastolic (congestive) heart failure: Secondary | ICD-10-CM

## 2020-12-25 ENCOUNTER — Encounter (HOSPITAL_COMMUNITY): Payer: Self-pay

## 2020-12-25 ENCOUNTER — Ambulatory Visit (HOSPITAL_COMMUNITY): Payer: Medicaid Other

## 2021-09-03 ENCOUNTER — Other Ambulatory Visit (HOSPITAL_COMMUNITY): Payer: Self-pay | Admitting: Family Medicine

## 2021-09-03 ENCOUNTER — Other Ambulatory Visit: Payer: Self-pay | Admitting: Family Medicine

## 2021-09-03 DIAGNOSIS — R229 Localized swelling, mass and lump, unspecified: Secondary | ICD-10-CM

## 2021-09-12 ENCOUNTER — Other Ambulatory Visit (HOSPITAL_COMMUNITY): Payer: Self-pay | Admitting: Family Medicine

## 2021-09-12 DIAGNOSIS — N63 Unspecified lump in unspecified breast: Secondary | ICD-10-CM

## 2021-09-13 ENCOUNTER — Ambulatory Visit (HOSPITAL_COMMUNITY): Admission: RE | Admit: 2021-09-13 | Payer: Medicaid Other | Source: Ambulatory Visit

## 2021-09-13 ENCOUNTER — Encounter (HOSPITAL_COMMUNITY): Payer: Self-pay

## 2021-10-03 ENCOUNTER — Other Ambulatory Visit (HOSPITAL_COMMUNITY): Payer: Self-pay | Admitting: Family Medicine

## 2021-10-03 DIAGNOSIS — N63 Unspecified lump in unspecified breast: Secondary | ICD-10-CM

## 2021-10-13 ENCOUNTER — Emergency Department (HOSPITAL_COMMUNITY): Payer: Medicaid Other

## 2021-10-13 ENCOUNTER — Other Ambulatory Visit: Payer: Self-pay

## 2021-10-13 ENCOUNTER — Inpatient Hospital Stay (HOSPITAL_COMMUNITY)
Admission: EM | Admit: 2021-10-13 | Discharge: 2021-10-18 | DRG: 374 | Disposition: A | Discharge status: Home Health | Payer: Medicaid Other | Attending: Internal Medicine | Admitting: Internal Medicine

## 2021-10-13 ENCOUNTER — Encounter (HOSPITAL_COMMUNITY): Payer: Self-pay

## 2021-10-13 DIAGNOSIS — K921 Melena: Secondary | ICD-10-CM | POA: Diagnosis present

## 2021-10-13 DIAGNOSIS — Z6834 Body mass index (BMI) 34.0-34.9, adult: Secondary | ICD-10-CM

## 2021-10-13 DIAGNOSIS — N183 Chronic kidney disease, stage 3 unspecified: Secondary | ICD-10-CM | POA: Diagnosis not present

## 2021-10-13 DIAGNOSIS — R7989 Other specified abnormal findings of blood chemistry: Secondary | ICD-10-CM | POA: Diagnosis present

## 2021-10-13 DIAGNOSIS — D500 Iron deficiency anemia secondary to blood loss (chronic): Secondary | ICD-10-CM | POA: Diagnosis present

## 2021-10-13 DIAGNOSIS — I130 Hypertensive heart and chronic kidney disease with heart failure and stage 1 through stage 4 chronic kidney disease, or unspecified chronic kidney disease: Secondary | ICD-10-CM | POA: Diagnosis present

## 2021-10-13 DIAGNOSIS — F17210 Nicotine dependence, cigarettes, uncomplicated: Secondary | ICD-10-CM | POA: Diagnosis present

## 2021-10-13 DIAGNOSIS — D509 Iron deficiency anemia, unspecified: Secondary | ICD-10-CM | POA: Diagnosis not present

## 2021-10-13 DIAGNOSIS — I5032 Chronic diastolic (congestive) heart failure: Secondary | ICD-10-CM | POA: Diagnosis present

## 2021-10-13 DIAGNOSIS — Z89511 Acquired absence of right leg below knee: Secondary | ICD-10-CM | POA: Diagnosis not present

## 2021-10-13 DIAGNOSIS — I619 Nontraumatic intracerebral hemorrhage, unspecified: Secondary | ICD-10-CM | POA: Diagnosis present

## 2021-10-13 DIAGNOSIS — C7802 Secondary malignant neoplasm of left lung: Secondary | ICD-10-CM | POA: Diagnosis present

## 2021-10-13 DIAGNOSIS — R531 Weakness: Secondary | ICD-10-CM

## 2021-10-13 DIAGNOSIS — E119 Type 2 diabetes mellitus without complications: Secondary | ICD-10-CM | POA: Diagnosis present

## 2021-10-13 DIAGNOSIS — N1831 Chronic kidney disease, stage 3a: Secondary | ICD-10-CM | POA: Diagnosis present

## 2021-10-13 DIAGNOSIS — C7989 Secondary malignant neoplasm of other specified sites: Secondary | ICD-10-CM | POA: Diagnosis present

## 2021-10-13 DIAGNOSIS — E1165 Type 2 diabetes mellitus with hyperglycemia: Secondary | ICD-10-CM | POA: Diagnosis not present

## 2021-10-13 DIAGNOSIS — C799 Secondary malignant neoplasm of unspecified site: Secondary | ICD-10-CM | POA: Diagnosis present

## 2021-10-13 DIAGNOSIS — K3189 Other diseases of stomach and duodenum: Secondary | ICD-10-CM | POA: Diagnosis not present

## 2021-10-13 DIAGNOSIS — Z20822 Contact with and (suspected) exposure to covid-19: Secondary | ICD-10-CM | POA: Diagnosis present

## 2021-10-13 DIAGNOSIS — C771 Secondary and unspecified malignant neoplasm of intrathoracic lymph nodes: Secondary | ICD-10-CM | POA: Diagnosis present

## 2021-10-13 DIAGNOSIS — D649 Anemia, unspecified: Secondary | ICD-10-CM | POA: Diagnosis not present

## 2021-10-13 DIAGNOSIS — C7889 Secondary malignant neoplasm of other digestive organs: Secondary | ICD-10-CM | POA: Diagnosis present

## 2021-10-13 DIAGNOSIS — Z794 Long term (current) use of insulin: Secondary | ICD-10-CM | POA: Diagnosis not present

## 2021-10-13 DIAGNOSIS — G936 Cerebral edema: Secondary | ICD-10-CM | POA: Diagnosis present

## 2021-10-13 DIAGNOSIS — N179 Acute kidney failure, unspecified: Secondary | ICD-10-CM | POA: Diagnosis present

## 2021-10-13 DIAGNOSIS — Z7982 Long term (current) use of aspirin: Secondary | ICD-10-CM

## 2021-10-13 DIAGNOSIS — C7931 Secondary malignant neoplasm of brain: Secondary | ICD-10-CM | POA: Diagnosis present

## 2021-10-13 DIAGNOSIS — Z79899 Other long term (current) drug therapy: Secondary | ICD-10-CM

## 2021-10-13 DIAGNOSIS — Z7984 Long term (current) use of oral hypoglycemic drugs: Secondary | ICD-10-CM

## 2021-10-13 DIAGNOSIS — M7989 Other specified soft tissue disorders: Secondary | ICD-10-CM

## 2021-10-13 DIAGNOSIS — C642 Malignant neoplasm of left kidney, except renal pelvis: Secondary | ICD-10-CM | POA: Diagnosis present

## 2021-10-13 DIAGNOSIS — I5033 Acute on chronic diastolic (congestive) heart failure: Secondary | ICD-10-CM | POA: Diagnosis present

## 2021-10-13 DIAGNOSIS — E1151 Type 2 diabetes mellitus with diabetic peripheral angiopathy without gangrene: Secondary | ICD-10-CM | POA: Diagnosis present

## 2021-10-13 DIAGNOSIS — E1122 Type 2 diabetes mellitus with diabetic chronic kidney disease: Secondary | ICD-10-CM | POA: Diagnosis present

## 2021-10-13 DIAGNOSIS — I10 Essential (primary) hypertension: Secondary | ICD-10-CM

## 2021-10-13 DIAGNOSIS — Z801 Family history of malignant neoplasm of trachea, bronchus and lung: Secondary | ICD-10-CM

## 2021-10-13 DIAGNOSIS — C787 Secondary malignant neoplasm of liver and intrahepatic bile duct: Secondary | ICD-10-CM | POA: Diagnosis present

## 2021-10-13 DIAGNOSIS — C7801 Secondary malignant neoplasm of right lung: Secondary | ICD-10-CM | POA: Diagnosis present

## 2021-10-13 DIAGNOSIS — E1142 Type 2 diabetes mellitus with diabetic polyneuropathy: Secondary | ICD-10-CM | POA: Diagnosis present

## 2021-10-13 DIAGNOSIS — K254 Chronic or unspecified gastric ulcer with hemorrhage: Secondary | ICD-10-CM | POA: Diagnosis not present

## 2021-10-13 DIAGNOSIS — Z9049 Acquired absence of other specified parts of digestive tract: Secondary | ICD-10-CM

## 2021-10-13 DIAGNOSIS — E669 Obesity, unspecified: Secondary | ICD-10-CM | POA: Diagnosis present

## 2021-10-13 DIAGNOSIS — K250 Acute gastric ulcer with hemorrhage: Secondary | ICD-10-CM | POA: Diagnosis not present

## 2021-10-13 DIAGNOSIS — N2889 Other specified disorders of kidney and ureter: Secondary | ICD-10-CM | POA: Diagnosis present

## 2021-10-13 DIAGNOSIS — E11649 Type 2 diabetes mellitus with hypoglycemia without coma: Secondary | ICD-10-CM | POA: Diagnosis not present

## 2021-10-13 DIAGNOSIS — E876 Hypokalemia: Secondary | ICD-10-CM | POA: Diagnosis present

## 2021-10-13 LAB — HEMOGLOBIN A1C
Hgb A1c MFr Bld: 5.4 % (ref 4.8–5.6)
Mean Plasma Glucose: 108.28 mg/dL

## 2021-10-13 LAB — COMPREHENSIVE METABOLIC PANEL
ALT: 11 U/L (ref 0–44)
AST: 11 U/L — ABNORMAL LOW (ref 15–41)
Albumin: 3 g/dL — ABNORMAL LOW (ref 3.5–5.0)
Alkaline Phosphatase: 107 U/L (ref 38–126)
Anion gap: 12 (ref 5–15)
BUN: 70 mg/dL — ABNORMAL HIGH (ref 6–20)
CO2: 25 mmol/L (ref 22–32)
Calcium: 8.8 mg/dL — ABNORMAL LOW (ref 8.9–10.3)
Chloride: 101 mmol/L (ref 98–111)
Creatinine, Ser: 1.78 mg/dL — ABNORMAL HIGH (ref 0.44–1.00)
GFR, Estimated: 34 mL/min — ABNORMAL LOW (ref >60)
Glucose, Bld: 113 mg/dL — ABNORMAL HIGH (ref 70–99)
Potassium: 3.1 mmol/L — ABNORMAL LOW (ref 3.5–5.1)
Sodium: 138 mmol/L (ref 135–145)
Total Bilirubin: 0.6 mg/dL (ref 0.3–1.2)
Total Protein: 6.5 g/dL (ref 6.5–8.1)

## 2021-10-13 LAB — CBC WITH DIFFERENTIAL/PLATELET
Abs Immature Granulocytes: 0.02 10*3/uL (ref 0.00–0.07)
Basophils Absolute: 0.1 10*3/uL (ref 0.0–0.1)
Basophils Relative: 1 %
Eosinophils Absolute: 0.2 10*3/uL (ref 0.0–0.5)
Eosinophils Relative: 3 %
HCT: 21.5 % — ABNORMAL LOW (ref 36.0–46.0)
Hemoglobin: 6.2 g/dL — CL (ref 12.0–15.0)
Immature Granulocytes: 0 %
Lymphocytes Relative: 22 %
Lymphs Abs: 1.2 10*3/uL (ref 0.7–4.0)
MCH: 23 pg — ABNORMAL LOW (ref 26.0–34.0)
MCHC: 28.8 g/dL — ABNORMAL LOW (ref 30.0–36.0)
MCV: 79.9 fL — ABNORMAL LOW (ref 80.0–100.0)
Monocytes Absolute: 0.4 10*3/uL (ref 0.1–1.0)
Monocytes Relative: 8 %
Neutro Abs: 3.7 10*3/uL (ref 1.7–7.7)
Neutrophils Relative %: 66 %
Platelets: 388 10*3/uL (ref 150–400)
RBC: 2.69 MIL/uL — ABNORMAL LOW (ref 3.87–5.11)
RDW: 18.2 % — ABNORMAL HIGH (ref 11.5–15.5)
WBC: 5.6 10*3/uL (ref 4.0–10.5)
nRBC: 0 % (ref 0.0–0.2)

## 2021-10-13 LAB — RESP PANEL BY RT-PCR (FLU A&B, COVID) ARPGX2
Influenza A by PCR: NEGATIVE
Influenza B by PCR: NEGATIVE
SARS Coronavirus 2 by RT PCR: NEGATIVE

## 2021-10-13 LAB — PREPARE RBC (CROSSMATCH)

## 2021-10-13 LAB — CBG MONITORING, ED: Glucose-Capillary: 106 mg/dL — ABNORMAL HIGH (ref 70–99)

## 2021-10-13 LAB — TROPONIN I (HIGH SENSITIVITY)
Troponin I (High Sensitivity): 25 ng/L — ABNORMAL HIGH (ref <18)
Troponin I (High Sensitivity): 25 ng/L — ABNORMAL HIGH (ref <18)

## 2021-10-13 LAB — GLUCOSE, CAPILLARY: Glucose-Capillary: 175 mg/dL — ABNORMAL HIGH (ref 70–99)

## 2021-10-13 LAB — ACETAMINOPHEN LEVEL: Acetaminophen (Tylenol), Serum: <10 ug/mL — ABNORMAL LOW (ref 10–30)

## 2021-10-13 LAB — POC OCCULT BLOOD, ED: Fecal Occult Bld: POSITIVE — AB

## 2021-10-13 LAB — SALICYLATE LEVEL: Salicylate Lvl: <7 mg/dL — ABNORMAL LOW (ref 7.0–30.0)

## 2021-10-13 IMAGING — CT CT HEAD W/O CM
3 series · 14 of 47 positions shown, 16 images · non-contrast
Comparison: [DATE].

CLINICAL DATA: dizziness. blurred vision.



[Series 2: head w o · axial · 0.44mm/px · z∈[+1189,+1324]mm · 8 of 33 slices shown, 10 images]
[im 3/33  brain]
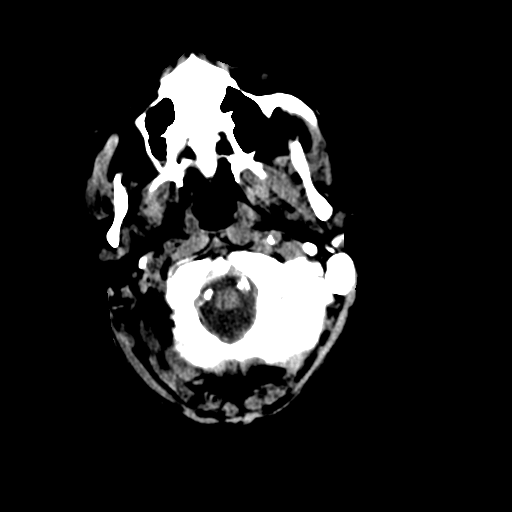
[im 3/33  bone]
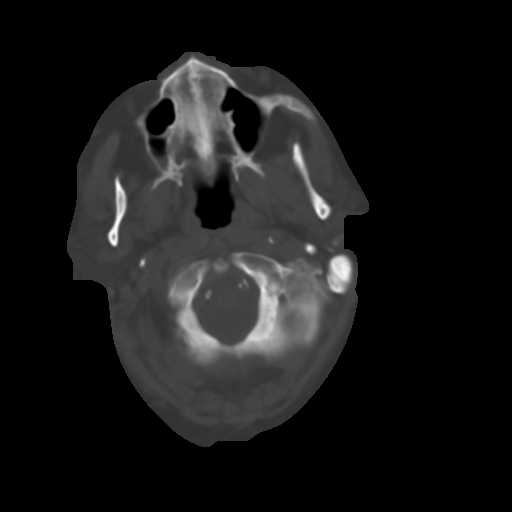
[im 7/33  brain]
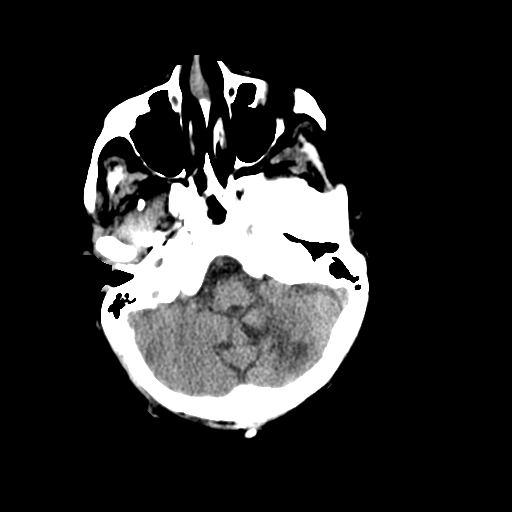
[im 10/33  brain]
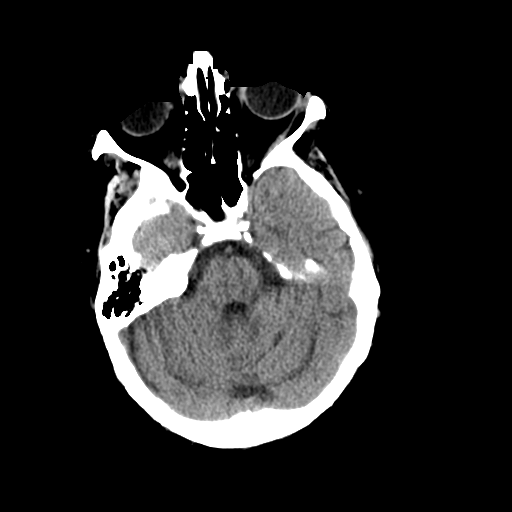
[im 15/33  brain]
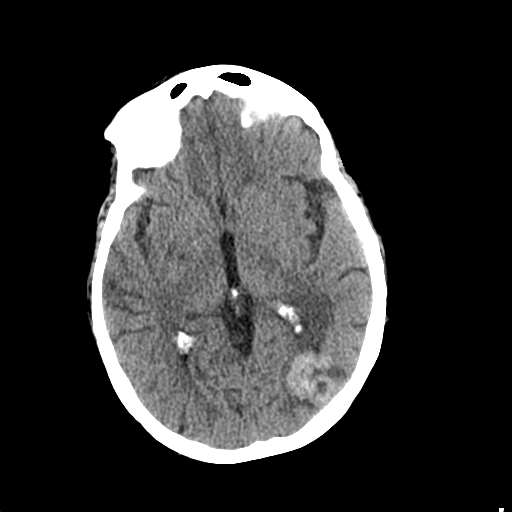
[im 18/33  brain]
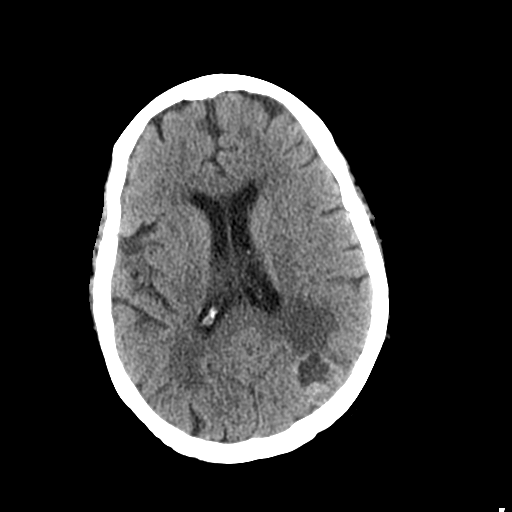
[im 18/33  bone]
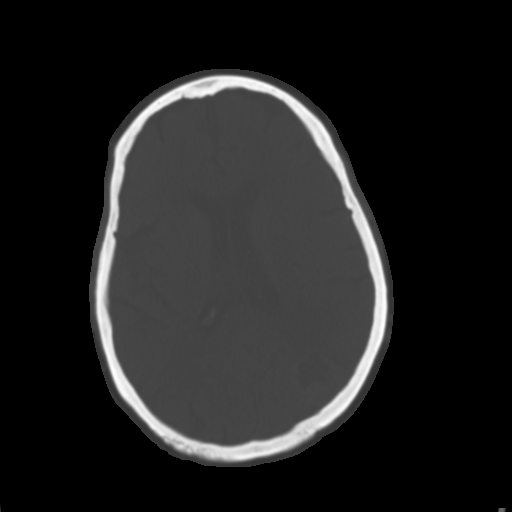
[im 23/33  brain]
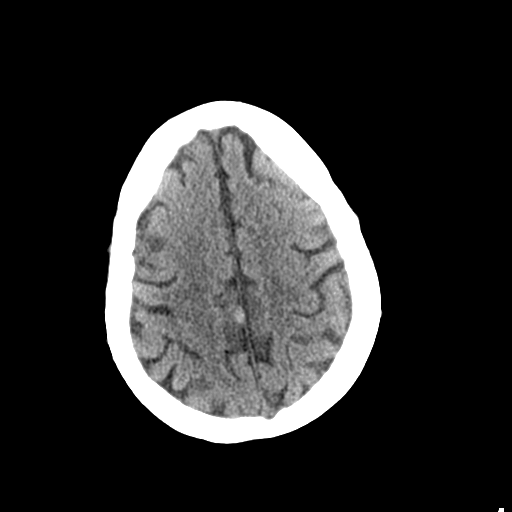
[im 26/33  brain]
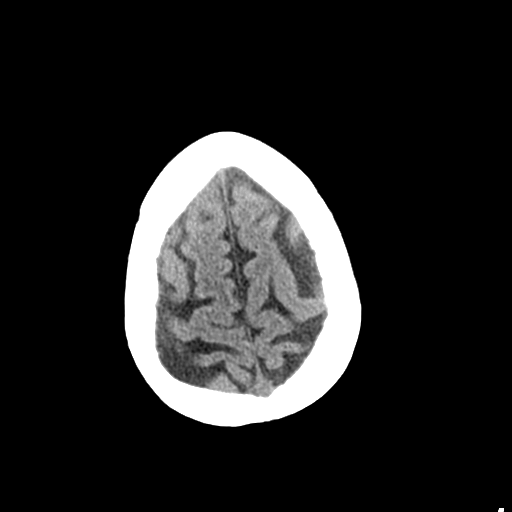
[im 30/33  brain]
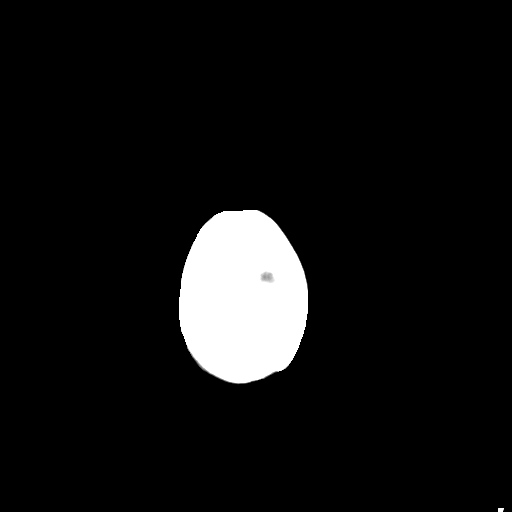

[Series 4: coronal soft · coronal · 0.31mm/px · 3 of 69 slices shown]
[im 23/69  brain]
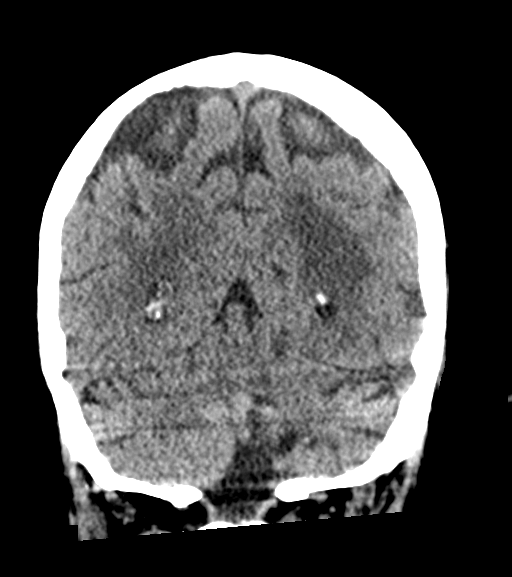
[im 31/69  brain]
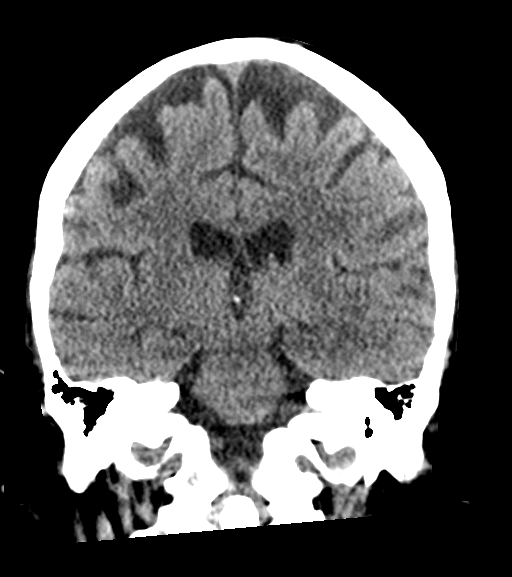
[im 38/69  brain]
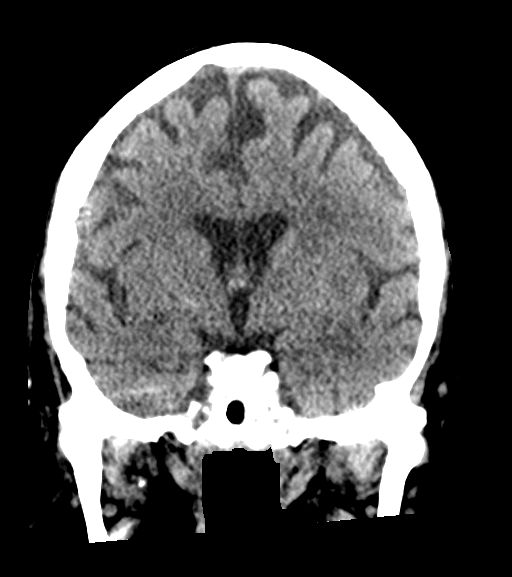

[Series 5: sagittal soft · sagittal · 0.33mm/px · 3 of 54 slices shown]
[im 18/54  brain]
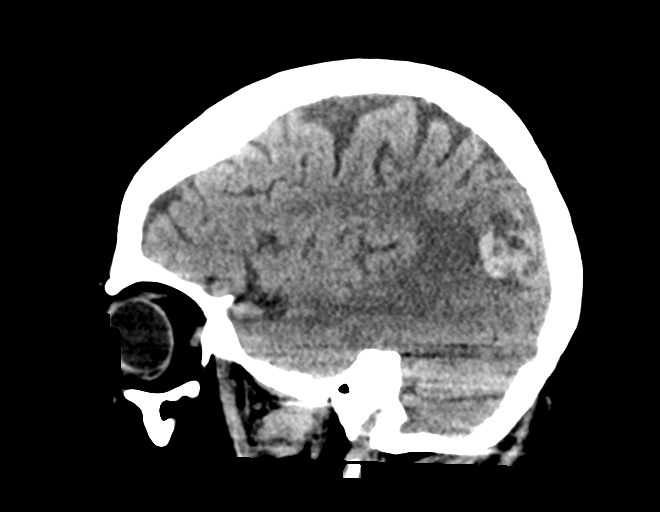
[im 27/54  brain]
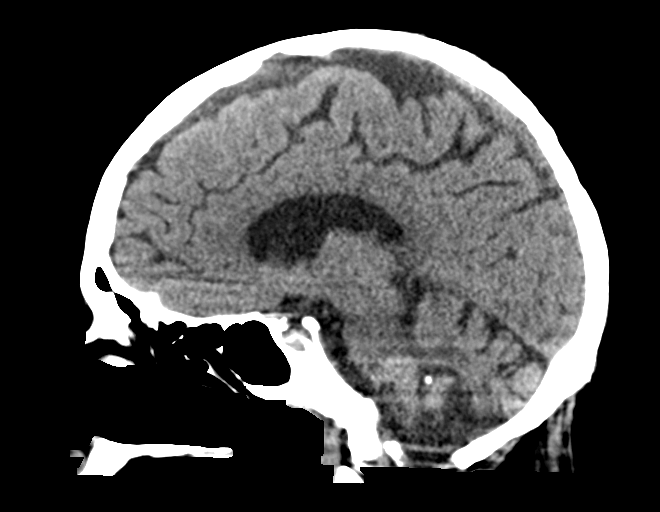
[im 36/54  brain]
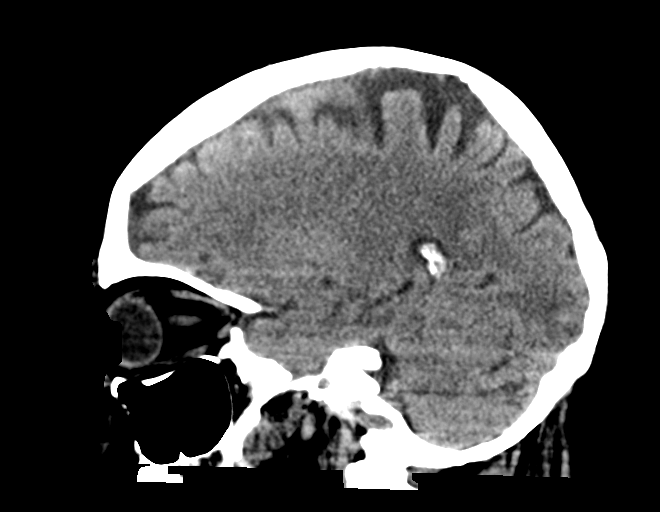

[14 of 47 positions shown; findings below may reference images not displayed]

FINDINGS: Brain: There is a lesion in the LEFT parieto-occipital lobe with
adjacent vasogenic edema and a peripheral rim of intrinsic
hyperdensity. It measures 2.6 x 2.4 by 2.7 cm. There is an
additional intrinsically hyperdense region along the RIGHT
frontoparietal falx which measures 7 by 5 mm which is concerning for
an additional site of disease (series 2, image 23). Hyperdensity of
the LEFT frontal lobe measuring approximately 5 mm likely reflects
an additional site of disease (series 2, image 25). Hypodensity of
the RIGHT occipital lobe with a small amount of intrinsic
hyperdensity likely reflecting additional lesion (series 2, image
12; series 5, image 28). Hypodensity of the LEFT inferior
cerebellum, consistent with the sequela of remote prior infarction
and similar comparison to prior. No significant midline shift.

Vascular: Vascular calcifications of the carotid siphons.

Skull: No acute fracture.

Sinuses/Orbits: LEFT mastoid effusion.

Other: None.
IMPRESSION: 1. There is a 2.7 cm peripherally hyperdense masslike area in the
LEFT parieto-occipital lobe with adjacent vasogenic edema. This is
concerning for hemorrhagic metastasis. Recommend further evaluation
with dedicated brain MRI with and without contrast.
2. There are likely additional lesions in the bilateral cerebral
hemispheres.

These results were called by telephone at the time of interpretation
on [DATE] at [DATE] to provider MORA , who verbally
acknowledged these results.

## 2021-10-13 IMAGING — DX DG CHEST 2V
2 series · 2 of 2 positions shown · non-contrast
Comparison: [DATE]

CLINICAL DATA: smoker. weakness. rule out infection/malignancy

EXAM:
CHEST - 2 VIEW

[chest pa]
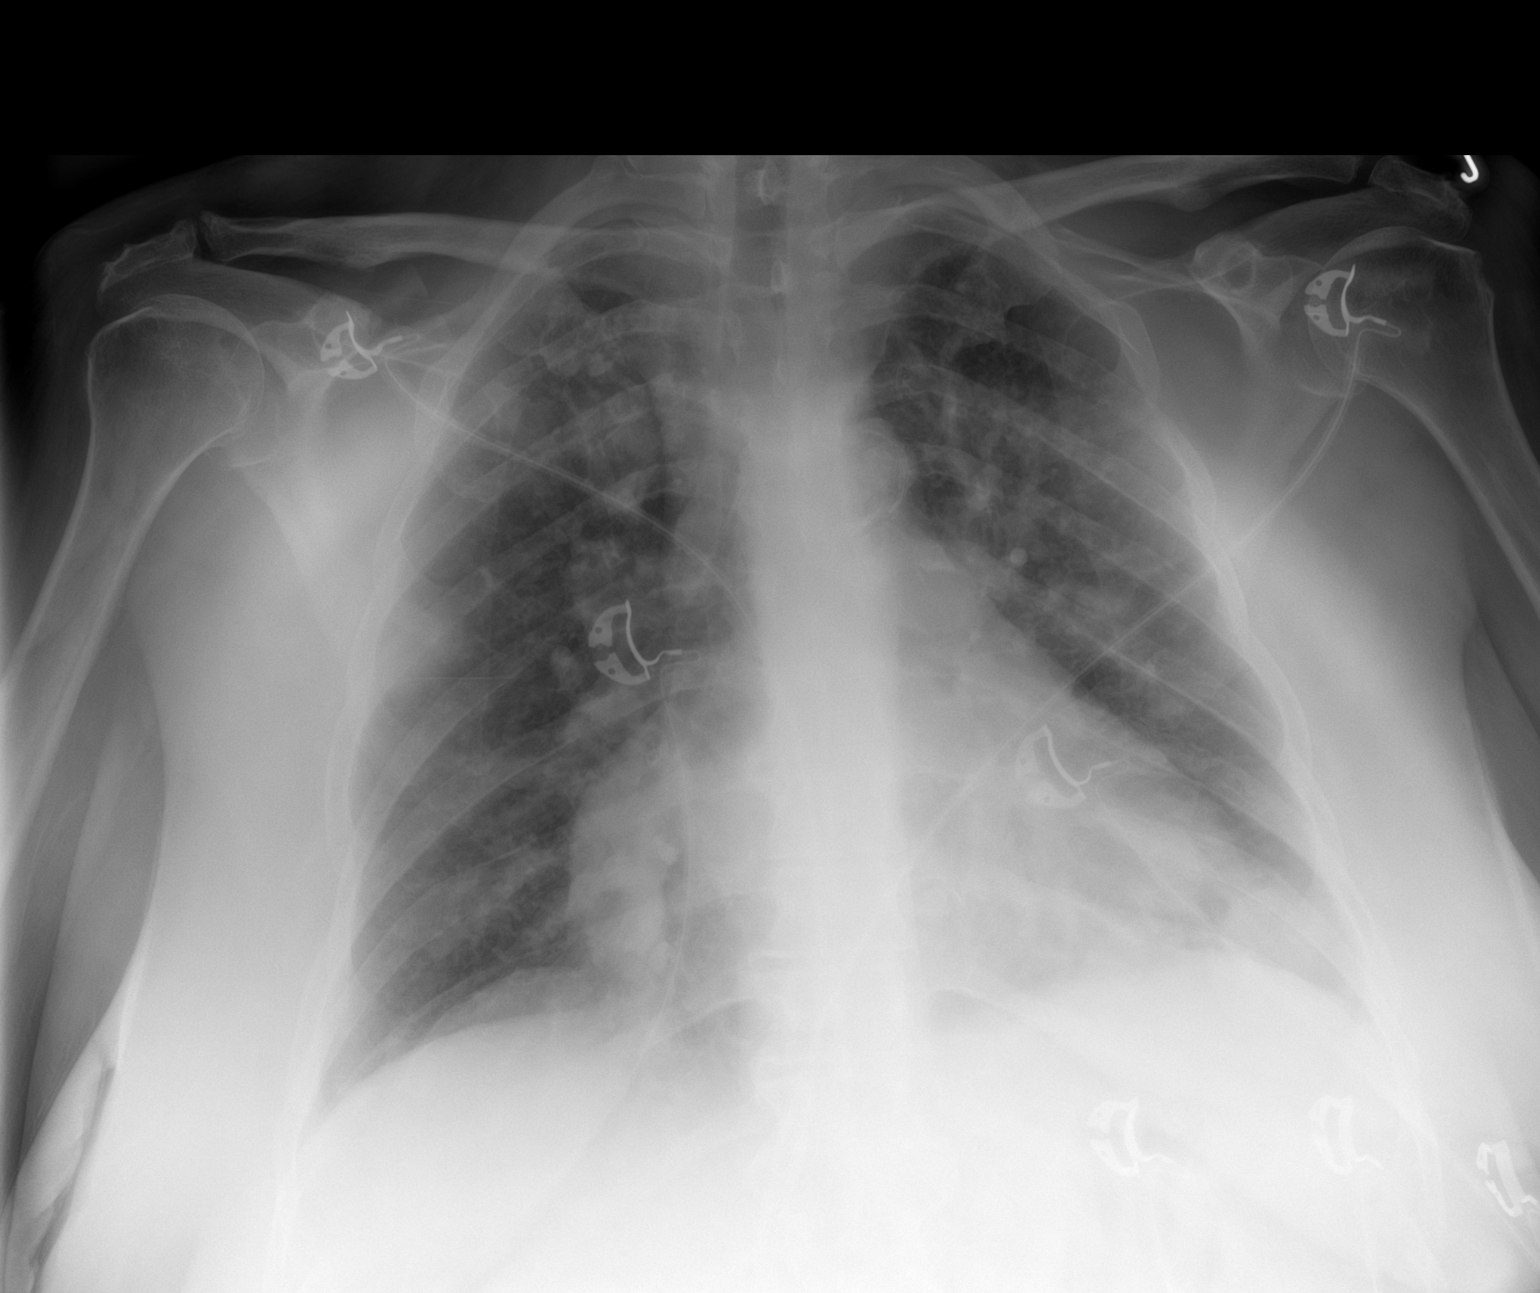

[chest lat]
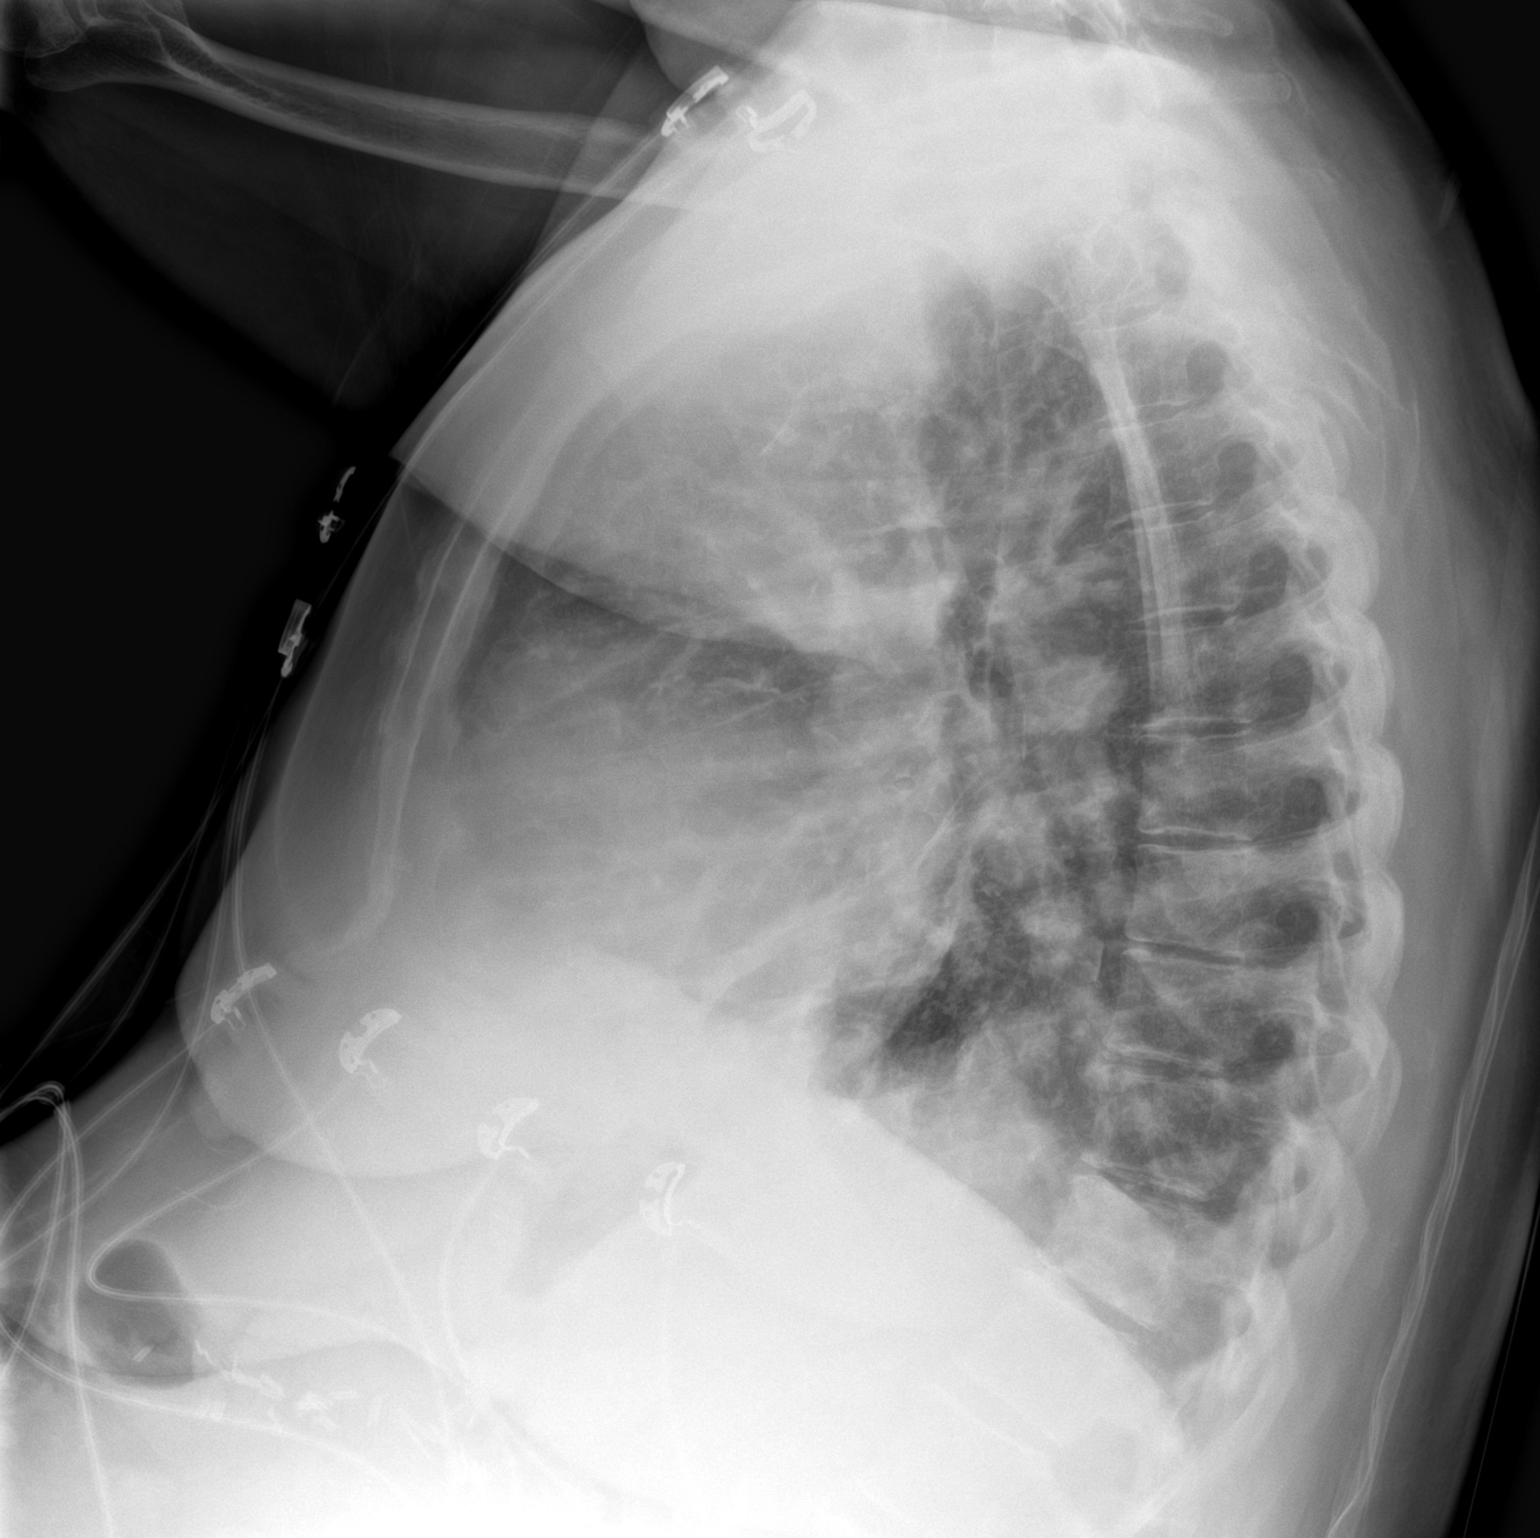

[2 of 2 positions shown; findings below may reference images not displayed]

FINDINGS: The cardiomediastinal silhouette is enlarged in
contour.Atherosclerotic calcifications. No pleural effusion. No
pneumothorax. Multiple nodular opacities throughout bilateral lungs.
Surgical clips project over the upper abdomen. Suggestion of
sclerosis of several vertebral bodies.
IMPRESSION: 1. Multiple nodular opacities bilaterally. Suggestion of multifocal
sclerosis of the vertebral bodies versus summation artifact.
Findings are concerning for multifocal metastases. Recommend further
dedicated evaluation with cross-sectional imaging.

## 2021-10-13 IMAGING — CT CT CHEST-ABD-PELV W/ CM
2 of 5 series · 13 of 36 positions shown, 15 images · IV contrast (Omnipaque or Isovue)
Comparison: None.

CLINICAL DATA: Brain metastasis of unknown primary.

EXAM:
CT CHEST, ABDOMEN, AND PELVIS WITH CONTRAST
TECHNIQUE: Multidetector CT imaging of the chest, abdomen and pelvis was
performed following the standard protocol during bolus
administration of intravenous contrast.

[Series 2: cap with · axial · 0.98mm/px · z∈[-341,+199]mm · 10 of 132 slices shown, 12 images]
[im 12/132  mediastinal]
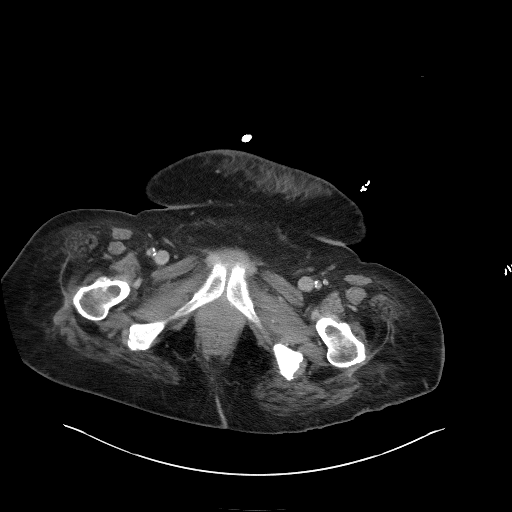
[im 12/132  bone]
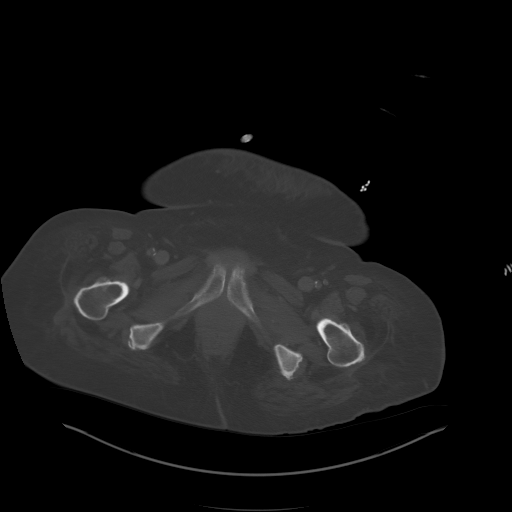
[im 24/132  mediastinal]
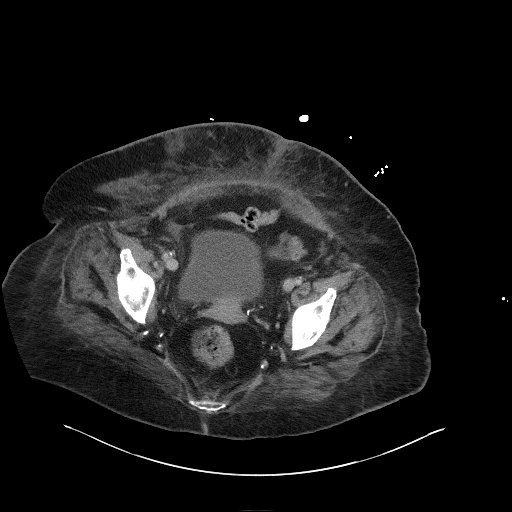
[im 36/132  mediastinal]
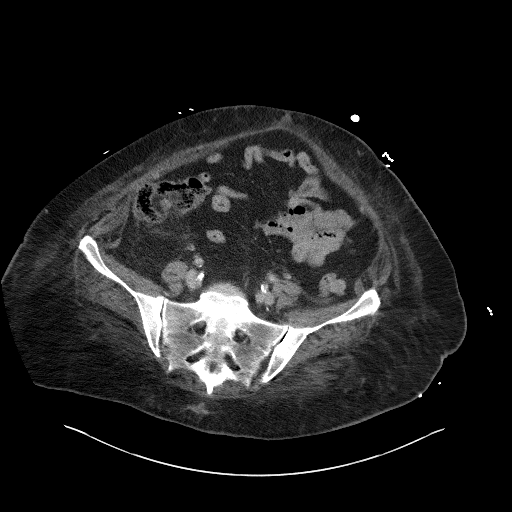
[im 48/132  mediastinal]
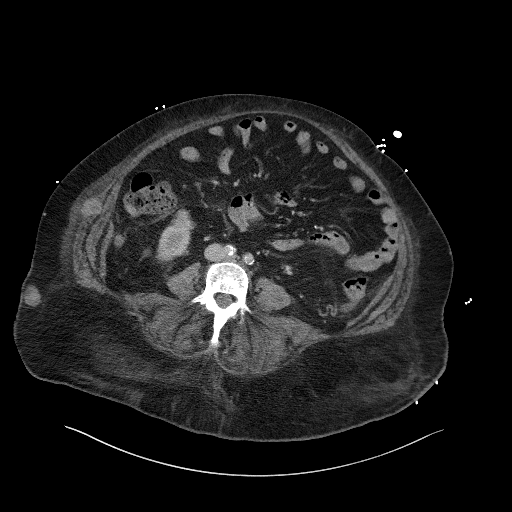
[im 60/132  mediastinal]
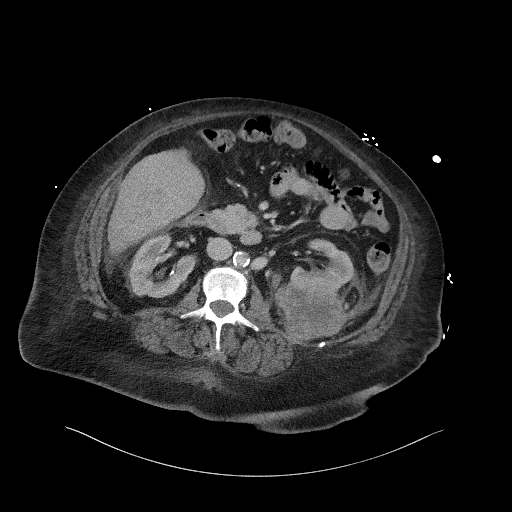
[im 72/132  mediastinal]
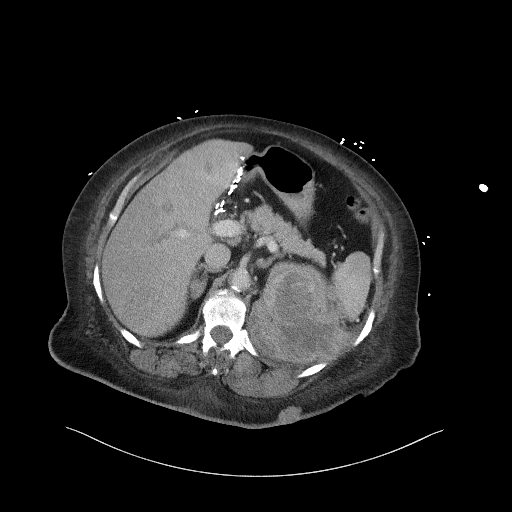
[im 84/132  mediastinal]
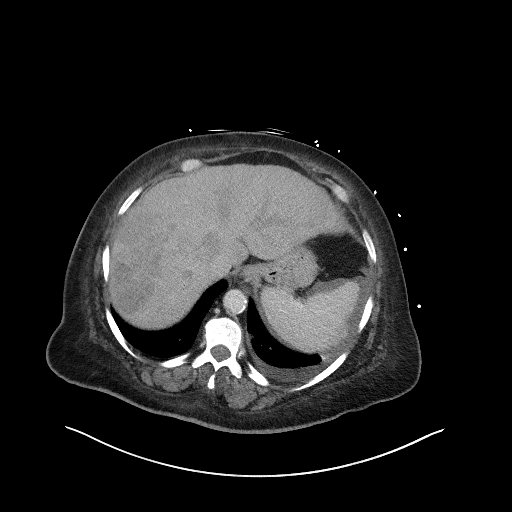
[im 96/132  mediastinal]
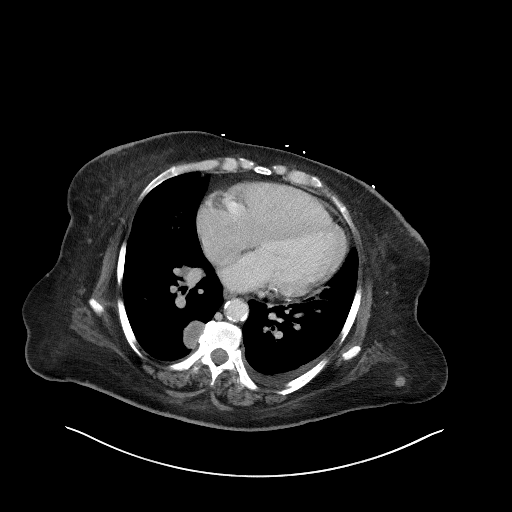
[im 108/132  mediastinal]
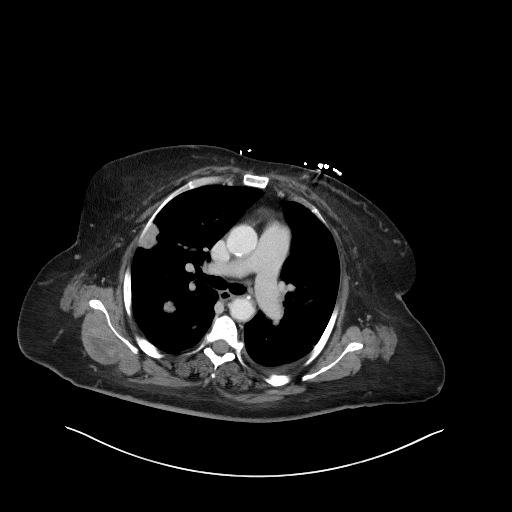
[im 108/132  bone]
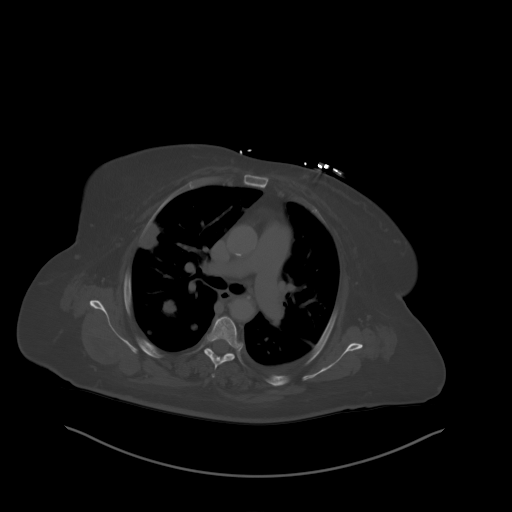
[im 120/132  mediastinal]
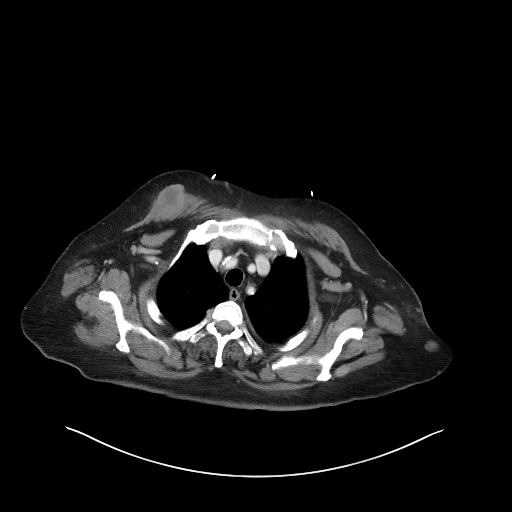

[Series 6: coronals · coronal · 1.03mm/px · 3 of 206 slices shown]
[im 42/206  mediastinal]
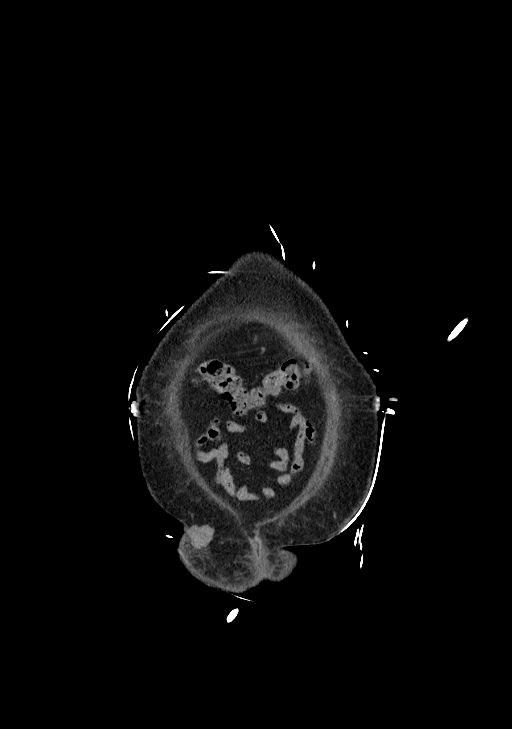
[im 83/206  mediastinal]
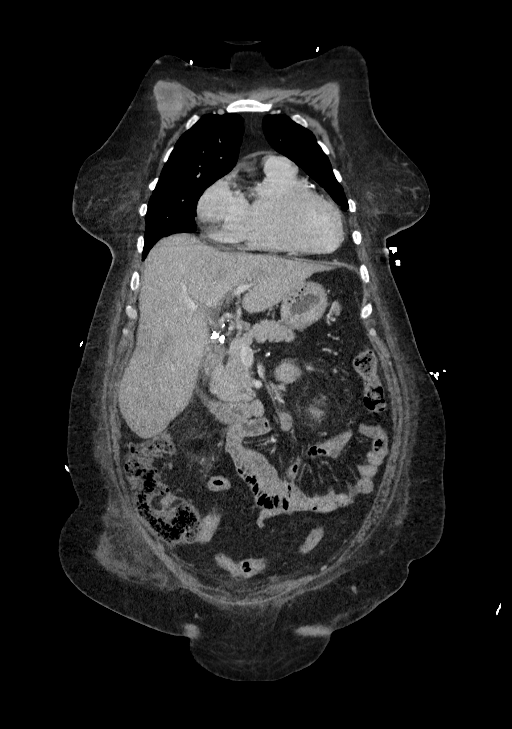
[im 124/206  mediastinal]
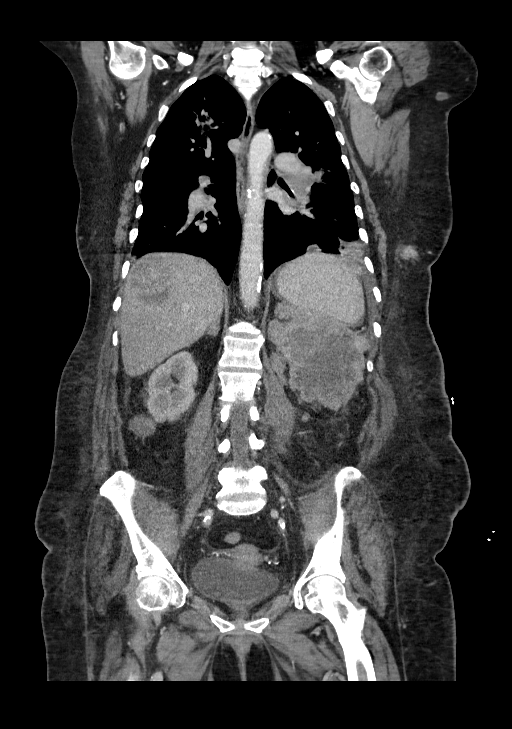

[13 of 36 positions shown; findings below may reference images not displayed]

RADIATION DOSE REDUCTION: This exam was performed according to the
departmental dose-optimization program which includes automated
exposure control, adjustment of the mA and/or kV according to
patient size and/or use of iterative reconstruction technique.

CONTRAST:  80mL OMNIPAQUE IOHEXOL 300 MG/ML  SOLN
FINDINGS: CT CHEST FINDINGS

Cardiovascular: Mild cardiomegaly. Aortic and coronary
atherosclerotic calcification noted.

Mediastinum/Lymph Nodes: Mild bilateral hilar lymphadenopathy is
seen. No pathologically enlarged mediastinal or axillary lymph nodes
are identified.

Lungs/Pleura: Multiple solid bilateral pulmonary nodules are seen,
consistent with diffuse pulmonary metastases. Index nodule in the
medial right lower lobe measures 2.9 x 1.7 cm on image 92/3. Index
nodule in the posterolateral left lower lobe measures 4.4 x 2.2 cm
on image 109/3. Small left pleural effusion also seen.

Musculoskeletal: Multiple chest wall soft tissue masses are seen
bilaterally, involving the breasts, chest wall muscles and
subcutaneous tissues. The 2 largest masses involve the right
pectoralis major muscle, measuring 3.3 x 2.9 cm on image [DATE], and
the right supraspinatus muscle measuring 5.7 x 3.2 cm on image [DATE].

CT ABDOMEN AND PELVIS FINDINGS

Hepatobiliary: Multiple hypovascular masses are seen throughout the
right and left hepatic lobes, largest in the lateral dome of the
right hepatic lobe measuring 10.7 x 5.1 cm. These are consistent
diffuse liver metastases. Prior cholecystectomy. No evidence of
biliary obstruction.

Pancreas:  No mass or inflammatory changes.

Spleen:  Within normal limits in size and appearance.

Adrenals/Urinary tract: A small right adrenal mass is seen measuring
2.0 x 1.5 cm, which is indeterminate but suspicious for adrenal
metastasis. A large heterogeneously enhancing mass is seen involving
the posterior left kidney, which shows invasion of the posterior
abdominal wall soft tissues. This measures 10.3 x 8.9 cm, and is
consistent with primary renal cell carcinoma. Smaller enhancing soft
tissue nodules are also seen in the left posterior pararenal space,
consistent with metastatic disease.

Two small masses are seen along the capsular surface of the anterior
mid and lower poles of the right kidney, which measure 2.2 cm and
1.5 cm in diameter. These could represent synchronous primary renal
cell carcinomas of the right kidney, or metastatic disease. Two soft
tissue nodules are involve the lateral portion of Gerota's fascia on
the right, largest measuring 2.2 x 1.6 cm. A subcapsular cyst
measuring 4 cm is also seen in the lateral interpolar region of the
right kidney.

Stomach/Bowel: No evidence of obstruction, inflammatory process, or
abnormal fluid collections.

Vascular/Lymphatic: Mild lymphadenopathy is seen in the left
paraaortic region, largest measuring 10 mm on image 67/2. A 1.3 cm
lymph node is seen in the deep left abdominal small bowel mesentery
adjacent to the duodenum. No pelvic lymphadenopathy identified.
Aortic atherosclerotic calcification noted. Left retroaortic renal
vein is noted, however there is no evidence of renal vein or IVC
thrombus.

Reproductive: No masses identified. Small amount of gas noted within
the endometrial cavity uterus.

Other:  None.

Musculoskeletal: No suspicious bone lesions identified. Multiple
small masses are seen scattered throughout the abdominal wall soft
tissues, consistent with metastatic disease.
IMPRESSION: 10 cm left renal mass with invasion of the left posterior abdominal
wall soft tissues, consistent with primary renal cell carcinoma.
Adjacent metastatic soft tissue nodules also noted within the left
perinephric space. Diffuse bilateral pulmonary metastases.

Two small masses along the capsular surface of the right kidney,
which may represent synchronous primary renal cell carcinomas or
metastatic disease. Metastatic disease also involving Gerota's
fascia on the right.

Mild abdominal lymphadenopathy, consistent with metastatic disease.

Diffuse liver metastases.

Small right adrenal mass, suspicious for metastatic disease.

Diffuse bilateral pulmonary metastases. Bilateral hilar lymph node
metastases.

Diffuse chest and abdominal wall soft tissue metastases.

## 2021-10-13 MED ORDER — PANTOPRAZOLE 80MG IVPB - SIMPLE MED
80.0000 mg | Freq: Once | INTRAVENOUS | Status: AC
  Administered 2021-10-13: 80 mg via INTRAVENOUS
  Filled 2021-10-13: qty 100

## 2021-10-13 MED ORDER — INSULIN ASPART 100 UNIT/ML IJ SOLN
0.0000 [IU] | INTRAMUSCULAR | Status: DC
  Administered 2021-10-13: 3 [IU] via SUBCUTANEOUS
  Administered 2021-10-14 – 2021-10-16 (×3): 2 [IU] via SUBCUTANEOUS
  Administered 2021-10-17: 3 [IU] via SUBCUTANEOUS

## 2021-10-13 MED ORDER — POTASSIUM CHLORIDE CRYS ER 20 MEQ PO TBCR
40.0000 meq | EXTENDED_RELEASE_TABLET | Freq: Once | ORAL | Status: AC
  Administered 2021-10-13: 40 meq via ORAL
  Filled 2021-10-13: qty 2

## 2021-10-13 MED ORDER — SODIUM CHLORIDE 0.9 % IV SOLN
10.0000 mL/h | Freq: Once | INTRAVENOUS | Status: AC
  Administered 2021-10-13: 10 mL/h via INTRAVENOUS

## 2021-10-13 MED ORDER — SIMVASTATIN 20 MG PO TABS
40.0000 mg | ORAL_TABLET | Freq: Every day | ORAL | Status: DC
  Administered 2021-10-13 – 2021-10-17 (×5): 40 mg via ORAL
  Filled 2021-10-13 (×5): qty 2

## 2021-10-13 MED ORDER — OXYCODONE HCL 5 MG PO TABS: 5.0000 mg | ORAL_TABLET | Freq: Every day | ORAL | Status: DC | PRN

## 2021-10-13 MED ORDER — ONDANSETRON HCL 4 MG PO TABS: 4.0000 mg | ORAL_TABLET | Freq: Four times a day (QID) | ORAL | Status: DC | PRN

## 2021-10-13 MED ORDER — PANTOPRAZOLE SODIUM 40 MG IV SOLR: 40.0000 mg | Freq: Two times a day (BID) | INTRAVENOUS | Status: DC

## 2021-10-13 MED ORDER — ONDANSETRON HCL 4 MG/2ML IJ SOLN: 4.0000 mg | Freq: Four times a day (QID) | INTRAMUSCULAR | Status: DC | PRN

## 2021-10-13 MED ORDER — INSULIN DETEMIR 100 UNIT/ML ~~LOC~~ SOLN
25.0000 [IU] | Freq: Every day | SUBCUTANEOUS | Status: DC
  Administered 2021-10-13 – 2021-10-16 (×3): 25 [IU] via SUBCUTANEOUS
  Filled 2021-10-13 (×5): qty 0.25

## 2021-10-13 MED ORDER — PANTOPRAZOLE INFUSION (NEW) - SIMPLE MED
8.0000 mg/h | INTRAVENOUS | Status: DC
  Administered 2021-10-13 – 2021-10-14 (×3): 8 mg/h via INTRAVENOUS
  Filled 2021-10-13: qty 80
  Filled 2021-10-13 (×3): qty 100

## 2021-10-13 MED ORDER — IOHEXOL 300 MG/ML  SOLN
75.0000 mL | Freq: Once | INTRAMUSCULAR | Status: AC | PRN
  Administered 2021-10-13: 80 mL via INTRAVENOUS

## 2021-10-13 NOTE — Plan of Care (Signed)
  Problem: Education: Goal: Knowledge of General Education information will improve Description Including pain rating scale, medication(s)/side effects and non-pharmacologic comfort measures Outcome: Progressing   

## 2021-10-13 NOTE — H&P (Signed)
History and Physical  [name]:[id number] DOB: [birth year] DOA: 10/13/2021  Referring physician: Eustaquio Maize, PA-C, EDP PCP: Celene Squibb, MD  Outpatient Specialists:   Patient Coming From: home  Chief Complaint: weakness  HPI: Natalie Contreras is a 56 y.o. female with a history of hypertension, CHF, diabetes with poor control who is seen for weakness that is been going on for over the past couple of months.  The weakness is worse with walking and improved with rest.  While walking, she gets dizzy, blurred vision, muffled hearing and feeling like she is going to pass out.  The symptoms are get better at rest.  Over the past few days her symptoms have been worsening, prompting her to get evaluated at the emergency department.  Additionally, she complains of is not in the size of her neck, back, chest that started off like pea-sized and has increased in size and now are the size of large grapes.  They are tender and cause a headache, especially the one in her right neck.  She has been taking Aleve frequently for the pain -reporting taking 12 Aleve earlier today.  Emergency Department Course: T chest shows multiple nodular opacities concerning for multifocal metastasis.  CT of her head shows a 2.7 cm peripheral or hyperdense masslike area in the left parietal occipital lobe with vasogenic edema.  There is concern for hemorrhagic metastasis.  CT of her abdomen and pelvis show 10 cm left renal mass with invasion of the left posterior abdominal wall soft tissues with concern of primary renal cell carcinoma.  There are diffuse liver metastasis, right adrenal mass, pulmonary metastasis, hilar node metastasis, diffuse chest and abdominal wall soft tissue metastasis  Review of Systems:   Pt denies any fevers, chills, nausea, vomiting, diarrhea, constipation, abdominal pain, palpitations, headache, vision changes, lightheadedness, dizziness, melena, rectal bleeding.  Review of systems are  otherwise negative  Past Medical History:  Diagnosis Date   Abscess of foot 08/09/2019   WITH ULCER  RIGHT FOOT   Acute diastolic CHF (congestive heart failure) ([place]) 10/28/2016   Carotid stenosis, asymptomatic, bilateral 01/15/2017   Diabetes mellitus without complication (HCC)    Heart murmur    Hypertension    PAD (peripheral artery disease) ([place]) 04/10/2017   Left ABI 0.74. Right ABI 0.96.   Past Surgical History:  Procedure Laterality Date   ABDOMINAL AORTOGRAM W/LOWER EXTREMITY N/A 08/10/2019   Procedure: ABDOMINAL AORTOGRAM W/LOWER EXTREMITY;  Surgeon: Waynetta Sandy, MD;  Location: [facility];  Service: Cardiovascular;  Laterality: N/A;   AMPUTATION Right 09/03/2019   Procedure: RIGHT BELOW KNEE AMPUTATION;  Surgeon: Newt Minion, MD;  Location: [facility];  Service: Orthopedics;  Laterality: Right;   AMPUTATION TOE Left 05/05/2017   Procedure: LEFT SECOND TOE AMPUTATION;  Surgeon: Aviva Signs, MD;  Location: [facility];  Service: General;  Laterality: Left;   CATARACT EXTRACTION W/PHACO Left 02/08/2016   Procedure: CATARACT EXTRACTION PHACO AND INTRAOCULAR LENS PLACEMENT ([place]);  Surgeon: Tonny Branch, MD;  Location: [facility];  Service: Ophthalmology;  Laterality: Left;  CDE 11.43    CATARACT EXTRACTION W/PHACO Right 02/26/2016   Procedure: CATARACT EXTRACTION PHACO AND INTRAOCULAR LENS PLACEMENT RIGHT; CDE:  16.90;  Surgeon: Tonny Branch, MD;  Location: [facility];  Service: Ophthalmology;  Laterality: Right;   CHOLECYSTECTOMY     FOREIGN BODY REMOVAL Left 04/09/2017   Procedure: FOREIGN BODY REMOVAL ADULT FOOT;  Surgeon: Aviva Signs, MD;  Location: [facility];  Service: General;  Laterality: Left;   I & D EXTREMITY Right 08/11/2019   Procedure: IRRIGATION AND DEBRIDEMENT EXTREMITY;  Surgeon: Newt Minion, MD;  Location: [facility];  Service: Orthopedics;  Laterality: Right;   PERIPHERAL VASCULAR ATHERECTOMY Right 08/10/2019   Procedure: PERIPHERAL VASCULAR ATHERECTOMY;  Surgeon:  Waynetta Sandy, MD;  Location: [facility];  Service: Cardiovascular;  Laterality: Right;  superficial femoral   PERIPHERAL VASCULAR BALLOON ANGIOPLASTY Right 08/10/2019   Procedure: PERIPHERAL VASCULAR BALLOON ANGIOPLASTY;  Surgeon: Waynetta Sandy, MD;  Location: [facility];  Service: Cardiovascular;  Laterality: Right;  anterior tibial   TRANSMETATARSAL AMPUTATION Left 07/21/2017   Procedure: TRANSMETATARSAL AMPUTATION LEFT FOOT;  Surgeon: Aviva Signs, MD;  Location: [facility];  Service: General;  Laterality: Left;   Social History:  reports that she has been smoking cigarettes. She started smoking about 37 years ago. She has a 12.50 pack-year smoking history. She has never used smokeless tobacco. She reports that she does not drink alcohol and does not use drugs. Patient lives at home  No Known Allergies  Family History  Problem Relation Age of Onset   Lung cancer Mother    Lung cancer Father    AAA (abdominal aortic aneurysm) Brother       Prior to Admission medications   Medication Sig Start Date End Date Taking? Authorizing Provider  acetaminophen (TYLENOL) 325 MG tablet Take 1-2 tablets (325-650 mg total) by mouth every 4 (four) hours as needed for mild pain. 09/13/19   Love, Ivan Anchors, PA-C  aspirin EC 81 MG tablet Take 1 tablet (81 mg total) by mouth daily with breakfast. 06/15/19   Emokpae, Courage, MD  carvedilol (COREG) 3.125 MG tablet TAKE (1) TABLET BY MOUTH TWICE DAILY WITH A MEAL. 10/20/19   Jamse Arn, MD  docusate sodium (COLACE) 100 MG capsule Take 1 capsule (100 mg total) by mouth 2 (two) times daily. 09/16/19   Love, Ivan Anchors, PA-C  insulin detemir (LEVEMIR) 100 UNIT/ML injection Inject 25 Units into the skin at bedtime.     [provider]  metFORMIN (GLUCOPHAGE) 1000 MG tablet Take 1 tablet (1,000 mg total) by mouth daily with breakfast. 09/16/19   Love, Ivan Anchors, PA-C  methocarbamol (ROBAXIN) 500 MG tablet Take 1 tablet  (500 mg total) by mouth every 6 (six) hours as needed for muscle spasms. 09/16/19   Love, Ivan Anchors, PA-C  Multiple Vitamin (MULTIVITAMIN WITH MINERALS) TABS tablet Take 1 tablet by mouth daily. 09/14/19   Love, Ivan Anchors, PA-C  oxyCODONE (OXY IR/ROXICODONE) 5 MG immediate release tablet Take 1 tablet (5 mg total) by mouth daily as needed for severe pain. 09/16/19   Love, Ivan Anchors, PA-C  polyethylene glycol (MIRALAX / GLYCOLAX) 17 g packet Take 17 g by mouth 2 (two) times daily. 09/16/19   Love, Ivan Anchors, PA-C  simvastatin (ZOCOR) 40 MG tablet Take 40 mg by mouth at bedtime.     [provider]  torsemide (DEMADEX) 20 MG tablet Take 1 tablet (20 mg total) by mouth daily. Patient taking differently: Take 20 mg by mouth 2 (two) times daily.  06/15/19   Roxan Hockey, MD  traMADol (ULTRAM) 50 MG tablet Take 1 tablet (50 mg total) by mouth every 12 (twelve) hours as needed for moderate pain. 09/16/19   Love, Ivan Anchors, PA-C  traZODone (DESYREL) 50 MG tablet Take 0.5-1 tablets (25-50 mg total) by mouth at bedtime as needed for sleep. 09/16/19   Bary Leriche, PA-C  Physical Exam: BP (!) 143/37    Pulse 71    Temp 97.7 F (36.5 C)    Resp 16    Ht 5\' 4"  (1.626 m)    Wt 93 kg    LMP 12/16/2015    SpO2 98%    BMI 35.19 kg/m   General: middle age female. Awake and alert and oriented x3. No acute cardiopulmonary distress.  HEENT: Normocephalic atraumatic.  Right and left ears normal in appearance.  Pupils equal, round, reactive to light. Extraocular muscles are intact. Sclerae anicteric and noninjected.  Moist mucosal membranes. No mucosal lesions.  Neck: Neck supple without lymphadenopathy. No carotid bruits. No masses palpated.  Cardiovascular: Regular rate with normal S1-S2 sounds. No murmurs, rubs, gallops auscultated. No JVD.  Respiratory: Good respiratory effort with no wheezes, rales, rhonchi. Lungs clear to auscultation bilaterally.  No accessory muscle use. Abdomen: Soft, nontender,  nondistended. Active bowel sounds. No masses or hepatosplenomegaly  Skin: 3 to 4 cm firm nodule in the base of the right neck.  There is also a mass of similar size in the left lower back, left chest wall.  Dry, warm to touch. 2+ dorsalis pedis and radial pulses. Musculoskeletal: No calf or leg pain. All major joints not erythematous nontender.  No upper or lower joint deformation.  Good ROM.  No contractures  Psychiatric: Intact judgment and insight. Pleasant and cooperative. Neurologic: No focal neurological deficits. Strength is 5/5 and symmetric in upper and lower extremities.  Cranial nerves II through XII are grossly intact.           Labs on Admission: I have personally reviewed following labs and imaging studies  CBC: Recent Labs  Lab 10/13/21 1428  WBC 5.6  NEUTROABS 3.7  HGB 6.2*  HCT 21.5*  MCV 79.9*  PLT 341   Basic Metabolic Panel: Recent Labs  Lab 10/13/21 1428  NA 138  K 3.1*  CL 101  CO2 25  GLUCOSE 113*  BUN 70*  CREATININE 1.78*  CALCIUM 8.8*   GFR: Estimated Creatinine Clearance: 39.9 mL/min (A) (by C-G formula based on SCr of 1.78 mg/dL (H)). Liver Function Tests: Recent Labs  Lab 10/13/21 1428  AST 11*  ALT 11  ALKPHOS 107  BILITOT 0.6  PROT 6.5  ALBUMIN 3.0*   No results for input(s): LIPASE, AMYLASE in the last 168 hours. No results for input(s): AMMONIA in the last 168 hours. Coagulation Profile: No results for input(s): INR, PROTIME in the last 168 hours. Cardiac Enzymes: No results for input(s): CKTOTAL, CKMB, CKMBINDEX, TROPONINI in the last 168 hours. BNP (last 3 results) No results for input(s): PROBNP in the last 8760 hours. HbA1C: No results for input(s): HGBA1C in the last 72 hours. CBG: Recent Labs  Lab 10/13/21 1525  GLUCAP 106*   Lipid Profile: No results for input(s): CHOL, HDL, LDLCALC, TRIG, CHOLHDL, LDLDIRECT in the last 72 hours. Thyroid Function Tests: No results for input(s): TSH, T4TOTAL, FREET4, T3FREE,  THYROIDAB in the last 72 hours. Anemia Panel: No results for input(s): VITAMINB12, FOLATE, FERRITIN, TIBC, IRON, RETICCTPCT in the last 72 hours. Urine analysis:    Component Value Date/Time   COLORURINE YELLOW 09/12/2019 0658   APPEARANCEUR CLEAR 09/12/2019 0658   LABSPEC 1.012 09/12/2019 0658   PHURINE 6.0 09/12/2019 0658   GLUCOSEU >=500 (A) 09/12/2019 0658   HGBUR SMALL (A) 09/12/2019 0658   BILIRUBINUR NEGATIVE 09/12/2019 [place] 09/12/2019 0658   PROTEINUR 100 (A) 09/12/2019 0658   NITRITE  NEGATIVE 09/12/2019 0658   LEUKOCYTESUR SMALL (A) 09/12/2019 0658   Sepsis Labs: @LABRCNTIP (procalcitonin:4,lacticidven:4) ) Recent Results (from the past 240 hour(s))  Resp Panel by RT-PCR (Flu A&B, Covid) Nasopharyngeal Swab     Status: None   Collection Time: 10/13/21  2:35 PM   Specimen: Nasopharyngeal Swab; Nasopharyngeal(NP) swabs in vial transport medium  Result Value Ref Range Status   SARS Coronavirus 2 by RT PCR NEGATIVE NEGATIVE Final    Comment: (NOTE) SARS-CoV-2 target nucleic acids are NOT DETECTED.  The SARS-CoV-2 RNA is generally detectable in upper respiratory specimens during the acute phase of infection. The lowest concentration of SARS-CoV-2 viral copies this assay can detect is 138 copies/mL. A negative result does not preclude SARS-Cov-2 infection and should not be used as the sole basis for treatment or other patient management decisions. A negative result may occur with  improper specimen collection/handling, submission of specimen other than nasopharyngeal swab, presence of viral mutation(s) within the areas targeted by this assay, and inadequate number of viral copies(<138 copies/mL). A negative result must be combined with clinical observations, patient history, and epidemiological information. The expected result is Negative.  Fact Sheet for Patients:  EntrepreneurPulse.com.au  Fact Sheet for Healthcare Providers:   IncredibleEmployment.be  This test is no t yet approved or cleared by the [place] FDA and  has been authorized for detection and/or diagnosis of SARS-CoV-2 by FDA under an Emergency Use Authorization (EUA). This EUA will remain  in effect (meaning this test can be used) for the duration of the COVID-19 declaration under Section 564(b)(1) of the Act, 21 U.S.C.section 360bbb-3(b)(1), unless the authorization is terminated  or revoked sooner.       Influenza A by PCR NEGATIVE NEGATIVE Final   Influenza B by PCR NEGATIVE NEGATIVE Final    Comment: (NOTE) The Xpert Xpress SARS-CoV-2/FLU/RSV plus assay is intended as an aid in the diagnosis of influenza from Nasopharyngeal swab specimens and should not be used as a sole basis for treatment. Nasal washings and aspirates are unacceptable for Xpert Xpress SARS-CoV-2/FLU/RSV testing.  Fact Sheet for Patients: EntrepreneurPulse.com.au  Fact Sheet for Healthcare Providers: IncredibleEmployment.be  This test is not yet approved or cleared by the [place] FDA and has been authorized for detection and/or diagnosis of SARS-CoV-2 by FDA under an Emergency Use Authorization (EUA). This EUA will remain in effect (meaning this test can be used) for the duration of the COVID-19 declaration under Section 564(b)(1) of the Act, 21 U.S.C. section 360bbb-3(b)(1), unless the authorization is terminated or revoked.  Performed at [facility], [address], [place], [place] [address]      Radiological Exams on Admission: DG Chest 2 View  Result Date: 10/13/2021 CLINICAL DATA:  smoker. weakness. rule out infection/malignancy EXAM: CHEST - 2 VIEW COMPARISON:  April 07, 2017 FINDINGS: The cardiomediastinal silhouette is enlarged in contour.Atherosclerotic calcifications. No pleural effusion. No pneumothorax. Multiple nodular opacities throughout bilateral lungs. Surgical clips project over  the upper abdomen. Suggestion of sclerosis of several vertebral bodies. IMPRESSION: 1. Multiple nodular opacities bilaterally. Suggestion of multifocal sclerosis of the vertebral bodies versus summation artifact. Findings are concerning for multifocal metastases. Recommend further dedicated evaluation with cross-sectional imaging. Electronically Signed   By: Valentino Saxon M.D.   On: 10/13/2021 15:29   CT Head Wo Contrast  Result Date: 10/13/2021 CLINICAL DATA:  dizziness. blurred vision. EXAM: CT HEAD WITHOUT CONTRAST TECHNIQUE: Contiguous axial images were obtained from the base of the skull through the vertex without intravenous contrast. RADIATION  DOSE REDUCTION: This exam was performed according to the departmental dose-optimization program which includes automated exposure control, adjustment of the mA and/or kV according to patient size and/or use of iterative reconstruction technique. COMPARISON:  February 17, 2017. FINDINGS: Brain: There is a lesion in the LEFT parieto-occipital lobe with adjacent vasogenic edema and a peripheral rim of intrinsic hyperdensity. It measures 2.6 x 2.4 by 2.7 cm. There is an additional intrinsically hyperdense region along the RIGHT frontoparietal falx which measures 7 by 5 mm which is concerning for an additional site of disease (series 2, image 23). Hyperdensity of the LEFT frontal lobe measuring approximately 5 mm likely reflects an additional site of disease (series 2, image 25). Hypodensity of the RIGHT occipital lobe with a small amount of intrinsic hyperdensity likely reflecting additional lesion (series 2, image 12; series 5, image 28). Hypodensity of the LEFT inferior cerebellum, consistent with the sequela of remote prior infarction and similar comparison to prior. No significant midline shift. Vascular: Vascular calcifications of the carotid siphons. Skull: No acute fracture. Sinuses/Orbits: LEFT mastoid effusion. Other: None. IMPRESSION: 1. There is a 2.7 cm  peripherally hyperdense masslike area in the LEFT parieto-occipital lobe with adjacent vasogenic edema. 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 Recommend further evaluation with dedicated brain MRI with and without contrast. 2. There are likely additional lesions in the bilateral cerebral hemispheres. These results were called by telephone at the time of interpretation on 10/13/2021 at 3:20 pm to provider MARGAUX VENTER , who verbally acknowledged these results. Electronically Signed   By: Valentino Saxon M.D.   On: 10/13/2021 15:28   CT CHEST ABDOMEN PELVIS W CONTRAST  Result Date: 10/13/2021 CLINICAL DATA:  Brain metastasis of unknown primary. EXAM: CT CHEST, ABDOMEN, AND PELVIS WITH CONTRAST TECHNIQUE: Multidetector CT imaging of the chest, abdomen and pelvis was performed following the standard protocol during bolus administration of intravenous contrast. RADIATION DOSE REDUCTION: This exam was performed according to the departmental dose-optimization program which includes automated exposure control, adjustment of the mA and/or kV according to patient size and/or use of iterative reconstruction technique. CONTRAST:  15mL OMNIPAQUE IOHEXOL 300 MG/ML  SOLN COMPARISON:  None. FINDINGS: CT CHEST FINDINGS Cardiovascular: Mild cardiomegaly. Aortic and coronary atherosclerotic calcification noted. Mediastinum/Lymph Nodes: Mild bilateral hilar lymphadenopathy is seen. No pathologically enlarged mediastinal or axillary lymph nodes are identified. Lungs/Pleura: Multiple solid bilateral pulmonary nodules are seen, consistent with diffuse pulmonary metastases. Index nodule in the medial right lower lobe measures 2.9 x 1.7 cm on image 92/3. Index nodule in the posterolateral left lower lobe measures 4.4 x 2.2 cm on image 109/3. Small left pleural effusion also seen. Musculoskeletal: Multiple chest wall soft tissue masses are seen bilaterally, involving the breasts, chest wall muscles and subcutaneous tissues.  The 2 largest masses involve the right pectoralis major muscle, measuring 3.3 x 2.9 cm on image 12/2, and the right supraspinatus muscle measuring 5.7 x 3.2 cm on image 21/2. CT ABDOMEN AND PELVIS FINDINGS Hepatobiliary: Multiple hypovascular masses are seen throughout the right and left hepatic lobes, largest in the lateral dome of the right hepatic lobe measuring 10.7 x 5.1 cm. These are consistent diffuse liver metastases. Prior cholecystectomy. No evidence of biliary obstruction. Pancreas:  No mass or inflammatory changes. Spleen:  Within normal limits in size and appearance. Adrenals/Urinary tract: A small right adrenal mass is seen measuring 2.0 x 1.5 cm, which is indeterminate but suspicious for adrenal metastasis. A large heterogeneously enhancing mass is seen involving the posterior left kidney, which  shows invasion of the posterior abdominal wall soft tissues. This measures 10.3 x 8.9 cm, and is consistent with primary renal cell carcinoma. Smaller enhancing soft tissue nodules are also seen in the left posterior pararenal space, consistent with metastatic disease. Two small masses are seen along the capsular surface of the anterior mid and lower poles of the right kidney, which measure 2.2 cm and 1.5 cm in diameter. These could represent synchronous primary renal cell carcinomas of the right kidney, or metastatic disease. Two soft tissue nodules are involve the lateral portion of Gerota's fascia on the right, largest measuring 2.2 x 1.6 cm. A subcapsular cyst measuring 4 cm is also seen in the lateral interpolar region of the right kidney. Stomach/Bowel: No evidence of obstruction, inflammatory process, or abnormal fluid collections. Vascular/Lymphatic: Mild lymphadenopathy is seen in the left paraaortic region, largest measuring 10 mm on image 67/2. A 1.3 cm lymph node is seen in the deep left abdominal small bowel mesentery adjacent to the duodenum. No pelvic lymphadenopathy identified. Aortic  atherosclerotic calcification noted. Left retroaortic renal vein is noted, however there is no evidence of renal vein or IVC thrombus. Reproductive: No masses identified. Small amount of gas noted within the endometrial cavity uterus. Other:  None. Musculoskeletal: No suspicious bone lesions identified. Multiple small masses are seen scattered throughout the abdominal wall soft tissues, consistent with metastatic disease. IMPRESSION: 10 cm left renal mass with invasion of the left posterior abdominal wall soft tissues, consistent with primary renal cell carcinoma. Adjacent metastatic soft tissue nodules also noted within the left perinephric space. Diffuse bilateral pulmonary metastases. Two small masses along the capsular surface of the right kidney, which may represent synchronous primary renal cell carcinomas or metastatic disease. Metastatic disease also involving Gerota's fascia on the right. Mild abdominal lymphadenopathy, consistent with metastatic disease. Diffuse liver metastases. Small right adrenal mass, suspicious for metastatic disease. Diffuse bilateral pulmonary metastases. 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 Electronically Signed   By: Marlaine Hind M.D.   On: 10/13/2021 17:53    EKG: pending  Assessment/Plan: Principal Problem:   Symptomatic anemia Active Problems:   Benign essential HTN   Essential hypertension   Chronic diastolic CHF (congestive heart failure) (HCC)   AKI (acute kidney injury) (HCC)   Stage 3 chronic kidney disease (HCC)   Diabetic peripheral neuropathy (HCC)   Poorly controlled type 2 diabetes mellitus ([place])   Metastatic disease ([place])    This patient was discussed with the ED physician, including pertinent vitals, physical exam findings, labs, and imaging.  We also discussed care given by the ED provider.  Symptomatic anemia Transfused 2 units of blood Admit to [facility] Upper GI bleeding Start  Protonix Check CBC in the morning GI to consult N.p.o. 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

## 2021-10-13 NOTE — ED Notes (Signed)
PA stated not to get orthostatic vitals at this time. Nurse notified.

## 2021-10-13 NOTE — Progress Notes (Signed)
New Admission Note:  Arrival Method: EMS Mental Orientation: Alert and oriented x 4 Telemetry: Box 06 NSR Assessment: Completed Skin: Warm and dry. Rt BKA, Left mets amps. Great toe remains. Redness to sacrum  IV: NSL x 2 Pain: Denies  Tubes: N/A Safety Measures: Safety Fall Prevention Plan initiated.  Admission: Completed 5 M  Orientation: Patient has been orientated to the room, unit and the staff. Welcome booklet given.  Family: None  Orders have been reviewed and implemented. Will continue to monitor the patient. Call light has been placed within reach and bed alarm has been activated.   Sima Matas BSN, RN  Phone Number: [phone]

## 2021-10-13 NOTE — ED Provider Notes (Signed)
[facility] EMERGENCY DEPARTMENT Provider Note   CSN: [id number] Arrival date & time: 10/13/21  1317     History  Chief Complaint  Patient presents with   Weakness    Generalized     Natalie Contreras is a 56 y.o. female with PMHx HTN, CHF, Diabetes who presents to the ED today with multiple complaints.   She endorses that she was ill with a viral illness about 1 month ago including subjective fevers, chills, cough.  She states that she had gone over this however for the past 2 weeks she has had intermittent episodes of dizziness, blurred vision, muffled hearing.  She first describes the dizziness as feeling off balance however then later states that she feels like she could pass out.  She states that she will sit down for a little while and the symptoms will resolve.   She also complains of "knots" to her chest and her neck that been present for about 1 month.  She states she feels like they have gotten larger in size.  She denies any pain with same.  She states that the knot on her right neck feels like it is pulling and causing a mild headache as well.  She states that she has been taking "Aleve like candy" due to the pain.  She states that she took approximately 12 Aleve earlier today for the pain.  Patient also complains of dark-colored/black stools for the past week or so.  She states that she has felt fatigued and not wanting to eat or drink much.  She denies any diarrhea however states that she has history of chronic diarrhea in the past.  She denies any specific abdominal pain.  Denies any unexpected weight loss recently.   The history is provided by the patient, a relative and medical records.      Home Medications Prior to Admission medications   Medication Sig Start Date End Date Taking? Authorizing Provider  acetaminophen (TYLENOL) 325 MG tablet Take 1-2 tablets (325-650 mg total) by mouth every 4 (four) hours as needed for mild pain. 09/13/19   Love, Ivan Anchors, PA-C   aspirin EC 81 MG tablet Take 1 tablet (81 mg total) by mouth daily with breakfast. 06/15/19   Emokpae, Courage, MD  carvedilol (COREG) 3.125 MG tablet TAKE (1) TABLET BY MOUTH TWICE DAILY WITH A MEAL. 10/20/19   Jamse Arn, MD  docusate sodium (COLACE) 100 MG capsule Take 1 capsule (100 mg total) by mouth 2 (two) times daily. 09/16/19   Love, Ivan Anchors, PA-C  insulin detemir (LEVEMIR) 100 UNIT/ML injection Inject 25 Units into the skin at bedtime.     [provider]  metFORMIN (GLUCOPHAGE) 1000 MG tablet Take 1 tablet (1,000 mg total) by mouth daily with breakfast. 09/16/19   Love, Ivan Anchors, PA-C  methocarbamol (ROBAXIN) 500 MG tablet Take 1 tablet (500 mg total) by mouth every 6 (six) hours as needed for muscle spasms. 09/16/19   Love, Ivan Anchors, PA-C  Multiple Vitamin (MULTIVITAMIN WITH MINERALS) TABS tablet Take 1 tablet by mouth daily. 09/14/19   Love, Ivan Anchors, PA-C  oxyCODONE (OXY IR/ROXICODONE) 5 MG immediate release tablet Take 1 tablet (5 mg total) by mouth daily as needed for severe pain. 09/16/19   Love, Ivan Anchors, PA-C  polyethylene glycol (MIRALAX / GLYCOLAX) 17 g packet Take 17 g by mouth 2 (two) times daily. 09/16/19   Love, Ivan Anchors, PA-C  simvastatin (ZOCOR) 40 MG tablet Take 40 mg by mouth  at bedtime.     [provider]  torsemide (DEMADEX) 20 MG tablet Take 1 tablet (20 mg total) by mouth daily. Patient taking differently: Take 20 mg by mouth 2 (two) times daily.  06/15/19   Roxan Hockey, MD  traMADol (ULTRAM) 50 MG tablet Take 1 tablet (50 mg total) by mouth every 12 (twelve) hours as needed for moderate pain. 09/16/19   Love, Ivan Anchors, PA-C  traZODone (DESYREL) 50 MG tablet Take 0.5-1 tablets (25-50 mg total) by mouth at bedtime as needed for sleep. 09/16/19   Bary Leriche, PA-C      Allergies    Patient has no known allergies.    Review of Systems   Review of Systems  Constitutional:  Positive for appetite change and fatigue. Negative for fever  and unexpected weight change.  HENT:  Positive for hearing loss (muffled hearing).   Eyes:  Positive for visual disturbance (blurry vision).  Respiratory:  Negative for cough and shortness of breath.   Gastrointestinal:  Negative for abdominal pain, nausea and vomiting.       + dark colored stool  Skin:        + nodules  Neurological:  Positive for dizziness and weakness (generalized).       + pre syncope  All other systems reviewed and are negative.  Physical Exam Updated Vital Signs BP (!) 143/37    Pulse 71    Temp 97.7 F (36.5 C)    Resp 16    Ht 5\' 4"  (1.626 m)    Wt 93 kg    LMP 12/16/2015    SpO2 98%    BMI 35.19 kg/m  Physical Exam Vitals and nursing note reviewed.  Constitutional:      Appearance: She is not ill-appearing or diaphoretic.  HENT:     Head: Normocephalic and atraumatic.  Eyes:     Extraocular Movements: Extraocular movements intact.     Conjunctiva/sclera: Conjunctivae normal.     Pupils: Pupils are equal, round, and reactive to light.  Cardiovascular:     Rate and Rhythm: Normal rate and regular rhythm.     Pulses: Normal pulses.  Pulmonary:     Effort: Pulmonary effort is normal.     Breath sounds: Normal breath sounds. No wheezing, rhonchi or rales.  Abdominal:     Palpations: Abdomen is soft.     Tenderness: There is no abdominal tenderness. There is no guarding or rebound.  Genitourinary:    Rectum: Guaiac result positive.  Musculoskeletal:     Cervical back: Neck supple.  Skin:    General: Skin is warm and dry.     Coloration: Skin is pale.     Comments: Multiple 2 x 2 cm masses to R neck, Right upper inner quadrant of breast, substernal area  Neurological:     Mental Status: She is alert.     Comments: Alert and oriented to self, place, time and event.   Speech is fluent, clear without dysarthria or dysphasia.   Strength 5/5 in upper/lower extremities   Sensation intact in upper/lower extremities   Normal gait.  Negative Romberg. No  pronator drift.  Normal finger-to-nose and feet tapping.  CN I not tested  CN II grossly intact visual fields bilaterally. Did not visualize posterior eye.  CN III, IV, VI PERRLA and EOMs intact bilaterally  CN V Intact sensation to sharp and light touch to the face  CN VII facial movements symmetric  CN VIII not tested  CN IX, X no uvula deviation, symmetric rise of soft palate  CN XI 5/5 SCM and trapezius strength bilaterally  CN XII Midline tongue protrusion, symmetric L/R movements      ED Results / Procedures / Treatments   Labs (all labs ordered are listed, but only abnormal results are displayed) Labs Reviewed  COMPREHENSIVE METABOLIC PANEL - Abnormal; Notable for the following components:      Result Value   Potassium 3.1 (*)    Glucose, Bld 113 (*)    BUN 70 (*)    Creatinine, Ser 1.78 (*)    Calcium 8.8 (*)    Albumin 3.0 (*)    AST 11 (*)    GFR, Estimated 34 (*)    All other components within normal limits  CBC WITH DIFFERENTIAL/PLATELET - Abnormal; Notable for the following components:   RBC 2.69 (*)    Hemoglobin 6.2 (*)    HCT 21.5 (*)    MCV 79.9 (*)    MCH 23.0 (*)    MCHC 28.8 (*)    RDW 18.2 (*)    All other components within normal limits  SALICYLATE LEVEL - Abnormal; Notable for the following components:   Salicylate Lvl <5.6 (*)    All other components within normal limits  ACETAMINOPHEN LEVEL - Abnormal; Notable for the following components:   Acetaminophen (Tylenol), Serum <10 (*)    All other components within normal limits  POC OCCULT BLOOD, ED - Abnormal; Notable for the following components:   Fecal Occult Bld POSITIVE (*)    All other components within normal limits  CBG MONITORING, ED - Abnormal; Notable for the following components:   Glucose-Capillary 106 (*)    All other components within normal limits  TROPONIN I (HIGH SENSITIVITY) - Abnormal; Notable for the following components:   Troponin I (High Sensitivity) 25 (*)    All other  components within normal limits  TROPONIN I (HIGH SENSITIVITY) - Abnormal; Notable for the following components:   Troponin I (High Sensitivity) 25 (*)    All other components within normal limits  RESP PANEL BY RT-PCR (FLU A&B, COVID) ARPGX2  URINALYSIS, ROUTINE W REFLEX MICROSCOPIC  POC OCCULT BLOOD, ED  TYPE AND SCREEN  PREPARE RBC (CROSSMATCH)    EKG None  Radiology DG Chest 2 View  Result Date: 10/13/2021 CLINICAL DATA:  smoker. weakness. rule out infection/malignancy EXAM: CHEST - 2 VIEW COMPARISON:  April 07, 2017 FINDINGS: The cardiomediastinal silhouette is enlarged in contour.Atherosclerotic calcifications. No pleural effusion. No pneumothorax. Multiple nodular opacities throughout bilateral lungs. Surgical clips project over the upper abdomen. Suggestion of sclerosis of several vertebral bodies. IMPRESSION: 1. Multiple nodular opacities bilaterally. Suggestion of multifocal sclerosis of the vertebral bodies versus summation artifact. Findings are concerning for multifocal metastases. Recommend further dedicated evaluation with cross-sectional imaging. Electronically Signed   By: Valentino Saxon M.D.   On: 10/13/2021 15:29   CT Head Wo Contrast  Result Date: 10/13/2021 CLINICAL DATA:  dizziness. blurred vision. EXAM: CT HEAD WITHOUT CONTRAST TECHNIQUE: Contiguous axial images were obtained from the base of the skull through the vertex without intravenous contrast. RADIATION DOSE REDUCTION: This exam was performed according to the departmental dose-optimization program which includes automated exposure control, adjustment of the mA and/or kV according to patient size and/or use of iterative reconstruction technique. COMPARISON:  February 17, 2017. FINDINGS: Brain: There is a lesion in the LEFT parieto-occipital lobe with adjacent vasogenic edema and a peripheral rim of intrinsic hyperdensity. It measures 2.6 x  2.4 by 2.7 cm. There is an additional intrinsically hyperdense region along the  RIGHT frontoparietal falx which measures 7 by 5 mm which is concerning for an additional site of disease (series 2, image 23). Hyperdensity of the LEFT frontal lobe measuring approximately 5 mm likely reflects an additional site of disease (series 2, image 25). Hypodensity of the RIGHT occipital lobe with a small amount of intrinsic hyperdensity likely reflecting additional lesion (series 2, image 12; series 5, image 28). Hypodensity of the LEFT inferior cerebellum, consistent with the sequela of remote prior infarction and similar comparison to prior. No significant midline shift. Vascular: Vascular calcifications of the carotid siphons. Skull: No acute fracture. Sinuses/Orbits: LEFT mastoid effusion. Other: None. IMPRESSION: 1. There is a 2.7 cm peripherally hyperdense masslike area in the LEFT parieto-occipital lobe with adjacent vasogenic edema. 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 Recommend further evaluation with dedicated brain MRI with and without contrast. 2. There are likely additional lesions in the bilateral cerebral hemispheres. These results were called by telephone at the time of interpretation on 10/13/2021 at 3:20 pm to provider Tavian Callander , who verbally acknowledged these results. Electronically Signed   By: Valentino Saxon M.D.   On: 10/13/2021 15:28   CT CHEST ABDOMEN PELVIS W CONTRAST  Result Date: 10/13/2021 CLINICAL DATA:  Brain metastasis of unknown primary. EXAM: CT CHEST, ABDOMEN, AND PELVIS WITH CONTRAST TECHNIQUE: Multidetector CT imaging of the chest, abdomen and pelvis was performed following the standard protocol during bolus administration of intravenous contrast. RADIATION DOSE REDUCTION: This exam was performed according to the departmental dose-optimization program which includes automated exposure control, adjustment of the mA and/or kV according to patient size and/or use of iterative reconstruction technique. CONTRAST:  11mL OMNIPAQUE IOHEXOL 300 MG/ML  SOLN  COMPARISON:  None. FINDINGS: CT CHEST FINDINGS Cardiovascular: Mild cardiomegaly. Aortic and coronary atherosclerotic calcification noted. Mediastinum/Lymph Nodes: Mild bilateral hilar lymphadenopathy is seen. No pathologically enlarged mediastinal or axillary lymph nodes are identified. Lungs/Pleura: Multiple solid bilateral pulmonary nodules are seen, consistent with diffuse pulmonary metastases. Index nodule in the medial right lower lobe measures 2.9 x 1.7 cm on image 92/3. Index nodule in the posterolateral left lower lobe measures 4.4 x 2.2 cm on image 109/3. Small left pleural effusion also seen. Musculoskeletal: Multiple chest wall soft tissue masses are seen bilaterally, involving the breasts, chest wall muscles and subcutaneous tissues. The 2 largest masses involve the right pectoralis major muscle, measuring 3.3 x 2.9 cm on image 12/2, and the right supraspinatus muscle measuring 5.7 x 3.2 cm on image 21/2. CT ABDOMEN AND PELVIS FINDINGS Hepatobiliary: Multiple hypovascular masses are seen throughout the right and left hepatic lobes, largest in the lateral dome of the right hepatic lobe measuring 10.7 x 5.1 cm. These are consistent diffuse liver metastases. Prior cholecystectomy. No evidence of biliary obstruction. Pancreas:  No mass or inflammatory changes. Spleen:  Within normal limits in size and appearance. Adrenals/Urinary tract: A small right adrenal mass is seen measuring 2.0 x 1.5 cm, which is indeterminate but suspicious for adrenal metastasis. A large heterogeneously enhancing mass is seen involving the posterior left kidney, which shows invasion of the posterior abdominal wall soft tissues. This measures 10.3 x 8.9 cm, and is consistent with primary renal cell carcinoma. Smaller enhancing soft tissue nodules are also seen in the left posterior pararenal space, consistent with metastatic disease. Two small masses are seen along the capsular surface of the anterior mid and lower poles of the  right kidney, which measure  2.2 cm and 1.5 cm in diameter. These could represent synchronous primary renal cell carcinomas of the right kidney, or metastatic disease. Two soft tissue nodules are involve the lateral portion of Gerota's fascia on the right, largest measuring 2.2 x 1.6 cm. A subcapsular cyst measuring 4 cm is also seen in the lateral interpolar region of the right kidney. Stomach/Bowel: No evidence of obstruction, inflammatory process, or abnormal fluid collections. Vascular/Lymphatic: Mild lymphadenopathy is seen in the left paraaortic region, largest measuring 10 mm on image 67/2. A 1.3 cm lymph node is seen in the deep left abdominal small bowel mesentery adjacent to the duodenum. No pelvic lymphadenopathy identified. Aortic atherosclerotic calcification noted. Left retroaortic renal vein is noted, however there is no evidence of renal vein or IVC thrombus. Reproductive: No masses identified. Small amount of gas noted within the endometrial cavity uterus. Other:  None. Musculoskeletal: No suspicious bone lesions identified. Multiple small masses are seen scattered throughout the abdominal wall soft tissues, consistent with metastatic disease. IMPRESSION: 10 cm left renal mass with invasion of the left posterior abdominal wall soft tissues, consistent with primary renal cell carcinoma. Adjacent metastatic soft tissue nodules also noted within the left perinephric space. Diffuse bilateral pulmonary metastases. Two small masses along the capsular surface of the right kidney, which may represent synchronous primary renal cell carcinomas or metastatic disease. Metastatic disease also involving Gerota's fascia on the right. Mild abdominal lymphadenopathy, consistent with metastatic disease. Diffuse liver metastases. Small right adrenal mass, suspicious for metastatic disease. Diffuse bilateral pulmonary metastases. Bilateral hilar lymph node metastases. Diffuse chest and abdominal wall soft tissue  metastases. Electronically Signed   By: Marlaine Hind M.D.   On: 10/13/2021 17:53    Procedures .Critical Care Performed by: Eustaquio Maize, PA-C Authorized by: Eustaquio Maize, PA-C   Critical care provider statement:    Critical care time (minutes):  50   Critical care was time spent personally by me on the following activities:  Development of treatment plan with patient or surrogate, discussions with consultants, evaluation of patient's response to treatment, examination of patient, ordering and review of laboratory studies, ordering and review of radiographic studies, ordering and performing treatments and interventions, pulse oximetry, re-evaluation of patient's condition and review of old charts    Medications Ordered in ED Medications  0.9 %  sodium chloride infusion (has no administration in time range)  pantoprozole (PROTONIX) 80 mg /NS 100 mL infusion (8 mg/hr Intravenous New Bag/Given 10/13/21 1814)  pantoprazole (PROTONIX) injection 40 mg (has no administration in time range)  potassium chloride SA (KLOR-CON M) CR tablet 40 mEq (has no administration in time range)  pantoprazole (PROTONIX) 80 mg /NS 100 mL IVPB (80 mg Intravenous Bolus from Bag 10/13/21 1743)  iohexol (OMNIPAQUE) 300 MG/ML solution 75 mL (80 mLs Intravenous Contrast Given 10/13/21 1654)    ED Course/ Medical Decision Making/ A&P Clinical Course as of 10/13/21 1850  Sat Oct 13, 2021  1504 Hemoglobin(!!): 6.2 [MV]    Clinical Course User Index [MV] Eustaquio Maize, PA-C                           Medical Decision Making 56 year old female who presents to the ED today with multiple complaints.  Generalized weakness being the main 1.  Also has been worsening multiple nodules to her neck and chest for the past month, dark-colored stools, dizziness, blurry vision, muffled hearing.  On arrival to the ED today vitals are stable.  Patient appears to be no acute distress.  She does appear slightly pale on exam however.   She has no focal neurodeficits on exam today.  She is noted to have multiple 2 x 2 centimeter masses to her right neck, substernal area, right inner quadrant of breast.  Concern for possible lymphoma at this time.  She denies any recent unexpected weight loss.  Her dizziness, blurry vision, muffled hearing sound more like presyncope at this time however patient is a somewhat poor historian.  Given same we will plan for CT head.  We will add on chest x-ray given nodules on the chest wall to assess for malignancy.  We will add on CBC, CMP, troponin, COVID/flu, urinalysis.  Patient did tell me that she has been eating Aleve quite frequently due to pain in her neck and head that she feels is unrelated to the nodule in her neck.  We will add on Tylenol and aspirin levels at this time.  Problems Addressed: Melena: acute illness or injury Metastatic malignant neoplasm, unspecified site [facility]): acute illness or injury    Details: Mets seen in brain and on cxr. CT Chest, abdomen, and pelvis ordered here. Will need MRI brain with concern for hemorrhagic mass. Symptomatic anemia: acute illness or injury    Details: Hg 6.2. 2 units PRBCs ordered at this time with plan for admission. Question if related to excessive aleve use vs concern for colon CA. Pt has never had a colonscopy.  Amount and/or Complexity of Data Reviewed Labs: ordered. Decision-making details documented in ED Course.    Details: CBC with critical value of hgb 6.2. Fecal occult positive with melanotic stool at this time. Protonix provided. Pt in agreement for blood transfusion. Type and screen ordered. Pt will require admission for same.  CMP with potassium 3.1. Creatinine slightly elevated at 1.78 and BUN 70, only slightly elevated compared to previous. Not consistent with AKI at this time.   Tylenol and Aspirin level negative Radiology: ordered. Discussion of management or test interpretation with external provider(s): Received call from  radiologist regarding findings of both CT Head and CXR. Concern for metastatic disease. Recommends MRI brain and CT A/P for further eval. CT A/P pursued here. We do not have MRI capability here over the weekend.   Dr. Nehemiah Settle will admit and transfer to [facility] given CT Head findings.   Discussed case with [facility] Dr. Tarri Glenn who will have someone from her team evaluate patient in the AM.   Dr. Rory Percy with neurology consulted given CT Head findings; recommends neurosurgery consult instead.   Dr. Glenford Peers with neurosurgery aware and will see patient once she gets to [facility]/follow up on MRI.   Risk Prescription drug management. Decision regarding hospitalization.  Critical Care Total time providing critical care: 30-74 minutes          Final Clinical Impression(s) / ED Diagnoses Final diagnoses:  Symptomatic anemia  Melena  Metastatic malignant neoplasm, unspecified site [facility])  Hypokalemia    Rx / DC Orders ED Discharge Orders     None         Eustaquio Maize, PA-C 10/13/21 1850    Dorie Rank, MD 10/14/21 [phone]

## 2021-10-13 NOTE — ED Triage Notes (Signed)
Pt bib ems for generalized weakness x 1 month.  Reports having dizziness and blurred vision.  Reports sob earlier that has resolved at this time. Resp even and unlabored.  Skin warm and dry.

## 2021-10-13 NOTE — ED Notes (Signed)
Patient transported to CT 

## 2021-10-14 DIAGNOSIS — N179 Acute kidney failure, unspecified: Secondary | ICD-10-CM | POA: Diagnosis not present

## 2021-10-14 DIAGNOSIS — N1831 Chronic kidney disease, stage 3a: Secondary | ICD-10-CM

## 2021-10-14 DIAGNOSIS — D649 Anemia, unspecified: Secondary | ICD-10-CM | POA: Diagnosis not present

## 2021-10-14 DIAGNOSIS — K921 Melena: Secondary | ICD-10-CM

## 2021-10-14 DIAGNOSIS — Z89511 Acquired absence of right leg below knee: Secondary | ICD-10-CM

## 2021-10-14 DIAGNOSIS — I10 Essential (primary) hypertension: Secondary | ICD-10-CM | POA: Diagnosis not present

## 2021-10-14 LAB — CBC
HCT: 24.3 % — ABNORMAL LOW (ref 36.0–46.0)
Hemoglobin: 7.4 g/dL — ABNORMAL LOW (ref 12.0–15.0)
MCH: 24.2 pg — ABNORMAL LOW (ref 26.0–34.0)
MCHC: 30.5 g/dL (ref 30.0–36.0)
MCV: 79.4 fL — ABNORMAL LOW (ref 80.0–100.0)
Platelets: 338 10*3/uL (ref 150–400)
RBC: 3.06 MIL/uL — ABNORMAL LOW (ref 3.87–5.11)
RDW: 17.3 % — ABNORMAL HIGH (ref 11.5–15.5)
WBC: 5.1 10*3/uL (ref 4.0–10.5)
nRBC: 0 % (ref 0.0–0.2)

## 2021-10-14 LAB — TYPE AND SCREEN
ABO/RH(D): O POS
Antibody Screen: NEGATIVE
Unit division: 0
Unit division: 0

## 2021-10-14 LAB — APTT: aPTT: 27 seconds (ref 24–36)

## 2021-10-14 LAB — BASIC METABOLIC PANEL
Anion gap: 12 (ref 5–15)
BUN: 67 mg/dL — ABNORMAL HIGH (ref 6–20)
CO2: 22 mmol/L (ref 22–32)
Calcium: 8.3 mg/dL — ABNORMAL LOW (ref 8.9–10.3)
Chloride: 106 mmol/L (ref 98–111)
Creatinine, Ser: 1.8 mg/dL — ABNORMAL HIGH (ref 0.44–1.00)
GFR, Estimated: 33 mL/min — ABNORMAL LOW (ref >60)
Glucose, Bld: 104 mg/dL — ABNORMAL HIGH (ref 70–99)
Potassium: 3.4 mmol/L — ABNORMAL LOW (ref 3.5–5.1)
Sodium: 140 mmol/L (ref 135–145)

## 2021-10-14 LAB — GLUCOSE, CAPILLARY
Glucose-Capillary: 112 mg/dL — ABNORMAL HIGH (ref 70–99)
Glucose-Capillary: 122 mg/dL — ABNORMAL HIGH (ref 70–99)
Glucose-Capillary: 137 mg/dL — ABNORMAL HIGH (ref 70–99)
Glucose-Capillary: 77 mg/dL (ref 70–99)
Glucose-Capillary: 86 mg/dL (ref 70–99)
Glucose-Capillary: 90 mg/dL (ref 70–99)
Glucose-Capillary: 97 mg/dL (ref 70–99)

## 2021-10-14 LAB — BPAM RBC
Blood Product Expiration Date: 202303072359
Blood Product Expiration Date: 202303072359
ISSUE DATE / TIME: 202301281758
ISSUE DATE / TIME: 202301282010
Unit Type and Rh: 5100
Unit Type and Rh: 5100

## 2021-10-14 LAB — HIV ANTIBODY (ROUTINE TESTING W REFLEX): HIV Screen 4th Generation wRfx: NONREACTIVE

## 2021-10-14 LAB — PROTIME-INR
INR: 1.1 (ref 0.8–1.2)
Prothrombin Time: 13.9 seconds (ref 11.4–15.2)

## 2021-10-14 MED ORDER — PANTOPRAZOLE SODIUM 40 MG IV SOLR
40.0000 mg | Freq: Two times a day (BID) | INTRAVENOUS | Status: DC
  Administered 2021-10-14 – 2021-10-16 (×5): 40 mg via INTRAVENOUS
  Filled 2021-10-14 (×5): qty 40

## 2021-10-14 MED ORDER — POTASSIUM CHLORIDE CRYS ER 20 MEQ PO TBCR
40.0000 meq | EXTENDED_RELEASE_TABLET | Freq: Once | ORAL | Status: AC
  Administered 2021-10-14: 40 meq via ORAL
  Filled 2021-10-14: qty 2

## 2021-10-14 NOTE — Consult Note (Addendum)
Reason for Consult: Brain mass Referring Physician: Eustaquio Maize, PA-C   HPI: Natalie Contreras is a 56 y.o. female with a PmHx of HTN, CHF, and DM2 who presented to the [facility] ED yesterday with complaints of generalized weakens and dizziness. The patient reports that her symptoms began approximately 1 month ago without any precipitating events noted.  She reports her symptoms began approximately 1 month ago and initially consisted of flulike symptoms and a feeling of general malaise.  Her symptoms continued to progress and approximately 2 weeks ago began having dizziness and decreased appetite.  She initially thought she had the flu or COVID.  When the patient had a dizzy episode, she had to lay down until the episode passed, and then was able to proceed as usual.  She denies any unilateral weakness, visual disturbances, or confusion.  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  There were also additional lesions in the bilateral cerebral hemispheres of concern.  Neurosurgery consult was requested due to the findings on the CT head.  Past Medical History:  Diagnosis Date   Abscess of foot 08/09/2019   WITH ULCER  RIGHT FOOT   Acute diastolic CHF (congestive heart failure) ([place]) 10/28/2016   Carotid stenosis, asymptomatic, bilateral 01/15/2017   Diabetes mellitus without complication (HCC)    Heart murmur    Hypertension    PAD (peripheral artery disease) ([place]) 04/10/2017   Left ABI 0.74. Right ABI 0.96.    Past Surgical History:  Procedure Laterality Date   ABDOMINAL AORTOGRAM W/LOWER EXTREMITY N/A 08/10/2019   Procedure: ABDOMINAL AORTOGRAM W/LOWER EXTREMITY;  Surgeon: Waynetta Sandy, MD;  Location: [place] [facility];  Service: Cardiovascular;  Laterality: N/A;   AMPUTATION Right 09/03/2019   Procedure: RIGHT BELOW KNEE AMPUTATION;  Surgeon: Newt Minion, MD;  Location: [place];  Service: Orthopedics;  Laterality: Right;   AMPUTATION TOE Left 05/05/2017   Procedure: LEFT SECOND TOE AMPUTATION;  Surgeon: Aviva Signs, MD;  Location: [facility];  Service: General;  Laterality: Left;   CATARACT EXTRACTION W/PHACO Left 02/08/2016   Procedure: CATARACT EXTRACTION PHACO AND INTRAOCULAR LENS PLACEMENT ([place]);  Surgeon: Tonny Branch, MD;  Location: [facility];  Service: Ophthalmology;  Laterality: Left;  CDE 11.43    CATARACT EXTRACTION W/PHACO Right 02/26/2016   Procedure: CATARACT EXTRACTION PHACO AND INTRAOCULAR LENS PLACEMENT RIGHT; CDE:  16.90;  Surgeon: Tonny Branch, MD;  Location: [facility];  Service: Ophthalmology;  Laterality: Right;   CHOLECYSTECTOMY     FOREIGN BODY REMOVAL Left 04/09/2017   Procedure: FOREIGN BODY REMOVAL ADULT FOOT;  Surgeon: Aviva Signs, MD;  Location: [facility];  Service: General;  Laterality: Left;   I & D EXTREMITY Right 08/11/2019   Procedure: IRRIGATION AND DEBRIDEMENT EXTREMITY;  Surgeon: Newt Minion, MD;  Location: [place];  Service: Orthopedics;  Laterality: Right;   PERIPHERAL VASCULAR ATHERECTOMY Right 08/10/2019   Procedure: PERIPHERAL VASCULAR ATHERECTOMY;  Surgeon: Waynetta Sandy, MD;  Location: [place] [facility];  Service: Cardiovascular;  Laterality: Right;  superficial femoral   PERIPHERAL VASCULAR BALLOON ANGIOPLASTY Right 08/10/2019   Procedure: PERIPHERAL VASCULAR BALLOON ANGIOPLASTY;  Surgeon: Waynetta Sandy, MD;  Location: [place] [facility];  Service: Cardiovascular;  Laterality: Right;  anterior tibial   TRANSMETATARSAL AMPUTATION Left 07/21/2017   Procedure: TRANSMETATARSAL AMPUTATION LEFT FOOT;  Surgeon: Aviva Signs, MD;  Location: [facility];  Service: General;  Laterality: Left;    Family History  Problem Relation Age of Onset   Lung cancer Mother    Lung cancer Father    AAA (abdominal aortic aneurysm) Brother     Social History:  reports that she has been smoking cigarettes. She  started smoking about 37 years ago. She has a 12.50 pack-year smoking history. She has never used smokeless tobacco. She reports that she does not drink alcohol and does not use drugs.  Allergies: No Known Allergies  Medications: I have reviewed the patient's current medications.  Results for orders placed or performed during the hospital encounter of 10/13/21 (from the past 48 hour(s))  Comprehensive metabolic panel     Status: Abnormal   Collection Time: 10/13/21  2:28 PM  Result Value Ref Range   Sodium 138 135 - 145 mmol/L   Potassium 3.1 (L) 3.5 - 5.1 mmol/L   Chloride 101 98 - 111 mmol/L   CO2 25 22 - 32 mmol/L   Glucose, Bld 113 (H) 70 - 99 mg/dL    Comment: Glucose reference range applies only to samples taken after fasting for at least 8 hours.   BUN 70 (H) 6 - 20 mg/dL   Creatinine, Ser 1.78 (H) 0.44 - 1.00 mg/dL   Calcium 8.8 (L) 8.9 - 10.3 mg/dL   Total Protein 6.5 6.5 - 8.1 g/dL   Albumin 3.0 (L) 3.5 - 5.0 g/dL   AST 11 (L) 15 - 41 U/L   ALT 11 0 - 44 U/L   Alkaline Phosphatase 107 38 - 126 U/L   Total Bilirubin 0.6 0.3 - 1.2 mg/dL   GFR, Estimated 34 (L) >60 mL/min    Comment: (NOTE) Calculated using the CKD-EPI Creatinine Equation (2021)    Anion gap 12 5 - 15    Comment: Performed at [facility], [address], [place], [place] [address]  CBC with Differential     Status: Abnormal   Collection Time: 10/13/21  2:28 PM  Result Value Ref Range   WBC 5.6 4.0 - 10.5 K/uL   RBC 2.69 (L) 3.87 - 5.11 MIL/uL   Hemoglobin 6.2 (LL) 12.0 - 15.0 g/dL    Comment: This critical result has verified and been called to [facility] by Joaquin Courts on 01 28 2023 at 1500, and has been read back.    HCT 21.5 (L) 36.0 - 46.0 %   MCV 79.9 (L) 80.0 - 100.0 fL   MCH 23.0 (L) 26.0 - 34.0 pg   MCHC 28.8 (L) 30.0 - 36.0 g/dL   RDW 18.2 (H) 11.5 - 15.5 %   Platelets 388 150 - 400 K/uL   nRBC 0.0 0.0 - 0.2 %   Neutrophils Relative % 66 %   Neutro Abs 3.7 1.7 - 7.7 K/uL    Lymphocytes Relative 22 %   Lymphs Abs 1.2 0.7 - 4.0 K/uL   Monocytes Relative 8 %   Monocytes Absolute 0.4 0.1 - 1.0 K/uL   Eosinophils Relative 3 %   Eosinophils Absolute 0.2 0.0 - 0.5 K/uL   Basophils Relative 1 %   Basophils Absolute 0.1 0.0 - 0.1 K/uL   Immature Granulocytes 0 %   Abs Immature Granulocytes 0.02 0.00 - 0.07 K/uL    Comment: Performed at [facility], [address] [place], [place] [address]  Troponin I (High Sensitivity)     Status: Abnormal   Collection Time: 10/13/21  2:28 PM  Result Value Ref Range  Troponin I (High Sensitivity) 25 (H) <18 ng/L    Comment: (NOTE) Elevated high sensitivity troponin I (hsTnI) values and significant  changes across serial measurements may suggest ACS but many other  chronic and acute conditions are known to elevate hsTnI results.  Refer to the "Links" section for chest pain algorithms and additional  guidance. Performed at [facility], [address] [place], [place] [address]   Salicylate level     Status: Abnormal   Collection Time: 10/13/21  2:28 PM  Result Value Ref Range   Salicylate Lvl <3.4 (L) 7.0 - 30.0 mg/dL    Comment: Performed at [facility], [address], [place], [place] [address]  Acetaminophen level     Status: Abnormal   Collection Time: 10/13/21  2:28 PM  Result Value Ref Range   Acetaminophen (Tylenol), Serum <10 (L) 10 - 30 ug/mL    Comment: (NOTE) Therapeutic concentrations vary significantly. A range of 10-30 ug/mL  may be an effective concentration for many patients. However, some  are best treated at concentrations outside of this range. Acetaminophen concentrations >150 ug/mL at 4 hours after ingestion  and >50 ug/mL at 12 hours after ingestion are often associated with  toxic reactions.  Performed at [facility], [address] [place], [place] [address]   Resp Panel by RT-PCR (Flu A&B, Covid) Nasopharyngeal Swab     Status: None   Collection Time: 10/13/21  2:35 PM   Specimen:  Nasopharyngeal Swab; Nasopharyngeal(NP) swabs in vial transport medium  Result Value Ref Range   SARS Coronavirus 2 by RT PCR NEGATIVE NEGATIVE    Comment: (NOTE) SARS-CoV-2 target nucleic acids are NOT DETECTED.  The SARS-CoV-2 RNA is generally detectable in upper respiratory specimens during the acute phase of infection. The lowest concentration of SARS-CoV-2 viral copies this assay can detect is 138 copies/mL. A negative result does not preclude SARS-Cov-2 infection and should not be used as the sole basis for treatment or other patient management decisions. A negative result may occur with  improper specimen collection/handling, submission of specimen other than nasopharyngeal swab, presence of viral mutation(s) within the areas targeted by this assay, and inadequate number of viral copies(<138 copies/mL). A negative result must be combined with clinical observations, patient history, and epidemiological information. The expected result is Negative.  Fact Sheet for Patients:  EntrepreneurPulse.com.au  Fact Sheet for Healthcare Providers:  IncredibleEmployment.be  This test is no t yet approved or cleared by the [place] FDA and  has been authorized for detection and/or diagnosis of SARS-CoV-2 by FDA under an Emergency Use Authorization (EUA). This EUA will remain  in effect (meaning this test can be used) for the duration of the COVID-19 declaration under Section 564(b)(1) of the Act, 21 U.S.C.section 360bbb-3(b)(1), unless the authorization is terminated  or revoked sooner.       Influenza A by PCR NEGATIVE NEGATIVE   Influenza B by PCR NEGATIVE NEGATIVE    Comment: (NOTE) The Xpert Xpress SARS-CoV-2/FLU/RSV plus assay is intended as an aid in the diagnosis of influenza from Nasopharyngeal swab specimens and should not be used as a sole basis for treatment. Nasal washings and aspirates are unacceptable for Xpert Xpress  SARS-CoV-2/FLU/RSV testing.  Fact Sheet for Patients: EntrepreneurPulse.com.au  Fact Sheet for Healthcare Providers: IncredibleEmployment.be  This test is not yet approved or cleared by the [place] FDA and has been authorized for detection and/or diagnosis of SARS-CoV-2 by FDA under an Emergency Use Authorization (EUA). This EUA will remain in  effect (meaning this test can be used) for the duration of the COVID-19 declaration under Section 564(b)(1) of the Act, 21 U.S.C. section 360bbb-3(b)(1), unless the authorization is terminated or revoked.  Performed at [facility], [address] [place], [place] [address]   POC occult blood, ED     Status: Abnormal   Collection Time: 10/13/21  3:25 PM  Result Value Ref Range   Fecal Occult Bld POSITIVE (A) NEGATIVE  POC CBG, ED     Status: Abnormal   Collection Time: 10/13/21  3:25 PM  Result Value Ref Range   Glucose-Capillary 106 (H) 70 - 99 mg/dL    Comment: Glucose reference range applies only to samples taken after fasting for at least 8 hours.  Type and screen [facility]     Status: None   Collection Time: 10/13/21  3:31 PM  Result Value Ref Range   ABO/RH(D) O POS    Antibody Screen NEG    Sample Expiration 10/16/2021,2359    Unit Number [id number]    Blood Component Type RBC LR PHER1    Unit division 00    Status of Unit ISSUED,FINAL    Transfusion Status OK TO TRANSFUSE    Crossmatch Result Compatible    Unit Number [id number]    Blood Component Type RBC LR PHER2    Unit division 00    Status of Unit ISSUED,FINAL    Transfusion Status OK TO TRANSFUSE    Crossmatch Result      Compatible Performed at [facility], [address], [place], [place] [address]   Prepare RBC (crossmatch)     Status: None   Collection Time: 10/13/21  3:32 PM  Result Value Ref Range   Order Confirmation      ORDER PROCESSED BY BLOOD BANK Performed at [facility], [address], [place], [place] [address]   Troponin I (High Sensitivity)     Status: Abnormal   Collection Time: 10/13/21  4:34 PM  Result Value Ref Range   Troponin I (High Sensitivity) 25 (H) <18 ng/L    Comment: (NOTE) Elevated high sensitivity troponin I (hsTnI) values and significant  changes across serial measurements may suggest ACS but many other  chronic and acute conditions are known to elevate hsTnI results.  Refer to the "Links" section for chest pain algorithms and additional  guidance. Performed at [facility], [address] [place], [place] [address]   Glucose, capillary     Status: Abnormal   Collection Time: 10/13/21  9:55 PM  Result Value Ref Range   Glucose-Capillary 175 (H) 70 - 99 mg/dL    Comment: Glucose reference range applies only to samples taken after fasting for at least 8 hours.  HIV Antibody (routine testing w rflx)     Status: None   Collection Time: 10/13/21 10:54 PM  Result Value Ref Range   HIV Screen 4th Generation wRfx Non Reactive Non Reactive    Comment: Performed at [facility], [place] [address], [place], [place] [address]  Hemoglobin A1c     Status: None   Collection Time: 10/13/21 10:54 PM  Result Value Ref Range   Hgb A1c MFr Bld 5.4 4.8 - 5.6 %    Comment: (NOTE) Pre diabetes:          5.7%-6.4%  Diabetes:              >6.4%  Glycemic control for   <7.0% adults with diabetes    Mean Plasma  Glucose 108.28 mg/dL    Comment: Performed at [facility], [place] [address], [place], [place] [address]  Glucose, capillary     Status: Abnormal   Collection Time: 10/14/21 12:11 AM  Result Value Ref Range   Glucose-Capillary 137 (H) 70 - 99 mg/dL    Comment: Glucose reference range applies only to samples taken after fasting for at least 8 hours.  CBC     Status: Abnormal   Collection Time: 10/14/21  2:21 AM  Result Value Ref Range   WBC 5.1 4.0 - 10.5 K/uL   RBC 3.06 (L) 3.87 - 5.11 MIL/uL   Hemoglobin 7.4 (L) 12.0 - 15.0 g/dL   HCT 24.3 (L)  36.0 - 46.0 %   MCV 79.4 (L) 80.0 - 100.0 fL   MCH 24.2 (L) 26.0 - 34.0 pg   MCHC 30.5 30.0 - 36.0 g/dL   RDW 17.3 (H) 11.5 - 15.5 %   Platelets 338 150 - 400 K/uL   nRBC 0.0 0.0 - 0.2 %    Comment: Performed at [place] [address] [place], [place] [address]  Basic metabolic panel     Status: Abnormal   Collection Time: 10/14/21  2:21 AM  Result Value Ref Range   Sodium 140 135 - 145 mmol/L   Potassium 3.4 (L) 3.5 - 5.1 mmol/L   Chloride 106 98 - 111 mmol/L   CO2 22 22 - 32 mmol/L   Glucose, Bld 104 (H) 70 - 99 mg/dL    Comment: Glucose reference range applies only to samples taken after fasting for at least 8 hours.   BUN 67 (H) 6 - 20 mg/dL   Creatinine, Ser 1.80 (H) 0.44 - 1.00 mg/dL   Calcium 8.3 (L) 8.9 - 10.3 mg/dL   GFR, Estimated 33 (L) >60 mL/min    Comment: (NOTE) Calculated using the CKD-EPI Creatinine Equation (2021)    Anion gap 12 5 - 15    Comment: Performed at [place] [address] [place], [place] [address]  Glucose, capillary     Status: Abnormal   Collection Time: 10/14/21  2:51 AM  Result Value Ref Range   Glucose-Capillary 112 (H) 70 - 99 mg/dL    Comment: Glucose reference range applies only to samples taken after fasting for at least 8 hours.  Glucose, capillary     Status: None   Collection Time: 10/14/21  8:41 AM  Result Value Ref Range   Glucose-Capillary 90 70 - 99 mg/dL    Comment: Glucose reference range applies only to samples taken after fasting for at least 8 hours.    DG Chest 2 View  Result Date: 10/13/2021 CLINICAL DATA:  smoker. weakness. rule out infection/malignancy EXAM: CHEST - 2 VIEW COMPARISON:  April 07, 2017 FINDINGS: The cardiomediastinal silhouette is enlarged in contour.Atherosclerotic calcifications. No pleural effusion. No pneumothorax. Multiple nodular opacities throughout bilateral lungs. Surgical clips project over the upper abdomen. Suggestion of sclerosis of several vertebral bodies. IMPRESSION:  1. Multiple nodular opacities bilaterally. Suggestion of multifocal sclerosis of the vertebral bodies versus summation artifact. Findings are concerning for multifocal metastases. Recommend further dedicated evaluation with cross-sectional imaging. Electronically Signed   By: Valentino Saxon M.D.   On: 10/13/2021 15:29   CT Head Wo Contrast  Result Date: 10/13/2021 CLINICAL DATA:  dizziness. blurred vision. EXAM: CT HEAD WITHOUT CONTRAST TECHNIQUE: Contiguous axial images were obtained from the base of the skull through the vertex without intravenous contrast. RADIATION  DOSE REDUCTION: This exam was performed according to the departmental dose-optimization program which includes automated exposure control, adjustment of the mA and/or kV according to patient size and/or use of iterative reconstruction technique. COMPARISON:  February 17, 2017. FINDINGS: Brain: There is a lesion in the LEFT parieto-occipital lobe with adjacent vasogenic edema and a peripheral rim of intrinsic hyperdensity. It measures 2.6 x 2.4 by 2.7 cm. There is an additional intrinsically hyperdense region along the RIGHT frontoparietal falx which measures 7 by 5 mm which is concerning for an additional site of disease (series 2, image 23). Hyperdensity of the LEFT frontal lobe measuring approximately 5 mm likely reflects an additional site of disease (series 2, image 25). Hypodensity of the RIGHT occipital lobe with a small amount of intrinsic hyperdensity likely reflecting additional lesion (series 2, image 12; series 5, image 28). Hypodensity of the LEFT inferior cerebellum, consistent with the sequela of remote prior infarction and similar comparison to prior. No significant midline shift. Vascular: Vascular calcifications of the carotid siphons. Skull: No acute fracture. Sinuses/Orbits: LEFT mastoid effusion. Other: None. IMPRESSION: 1. There is a 2.7 cm peripherally hyperdense masslike area in the LEFT parieto-occipital lobe with adjacent  vasogenic edema. 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 Recommend further evaluation with dedicated brain MRI with and without contrast. 2. There are likely additional lesions in the bilateral cerebral hemispheres. These results were called by telephone at the time of interpretation on 10/13/2021 at 3:20 pm to provider MARGAUX VENTER , who verbally acknowledged these results. Electronically Signed   By: Valentino Saxon M.D.   On: 10/13/2021 15:28   CT CHEST ABDOMEN PELVIS W CONTRAST  Result Date: 10/13/2021 CLINICAL DATA:  Brain metastasis of unknown primary. EXAM: CT CHEST, ABDOMEN, AND PELVIS WITH CONTRAST TECHNIQUE: Multidetector CT imaging of the chest, abdomen and pelvis was performed following the standard protocol during bolus administration of intravenous contrast. RADIATION DOSE REDUCTION: This exam was performed according to the departmental dose-optimization program which includes automated exposure control, adjustment of the mA and/or kV according to patient size and/or use of iterative reconstruction technique. CONTRAST:  39m OMNIPAQUE IOHEXOL 300 MG/ML  SOLN COMPARISON:  None. FINDINGS: CT CHEST FINDINGS Cardiovascular: Mild cardiomegaly. Aortic and coronary atherosclerotic calcification noted. Mediastinum/Lymph Nodes: Mild bilateral hilar lymphadenopathy is seen. No pathologically enlarged mediastinal or axillary lymph nodes are identified. Lungs/Pleura: Multiple solid bilateral pulmonary nodules are seen, consistent with diffuse pulmonary metastases. Index nodule in the medial right lower lobe measures 2.9 x 1.7 cm on image 92/3. Index nodule in the posterolateral left lower lobe measures 4.4 x 2.2 cm on image 109/3. Small left pleural effusion also seen. Musculoskeletal: Multiple chest wall soft tissue masses are seen bilaterally, involving the breasts, chest wall muscles and subcutaneous tissues. The 2 largest masses involve the right pectoralis major muscle, measuring 3.3 x 2.9 cm  on image 12/2, and the right supraspinatus muscle measuring 5.7 x 3.2 cm on image 21/2. CT ABDOMEN AND PELVIS FINDINGS Hepatobiliary: Multiple hypovascular masses are seen throughout the right and left hepatic lobes, largest in the lateral dome of the right hepatic lobe measuring 10.7 x 5.1 cm. These are consistent diffuse liver metastases. Prior cholecystectomy. No evidence of biliary obstruction. Pancreas:  No mass or inflammatory changes. Spleen:  Within normal limits in size and appearance. Adrenals/Urinary tract: A small right adrenal mass is seen measuring 2.0 x 1.5 cm, which is indeterminate but suspicious for adrenal metastasis. A large heterogeneously enhancing mass is seen involving the posterior left kidney, which  shows invasion of the posterior abdominal wall soft tissues. This measures 10.3 x 8.9 cm, and is consistent with primary renal cell carcinoma. Smaller enhancing soft tissue nodules are also seen in the left posterior pararenal space, consistent with metastatic disease. Two small masses are seen along the capsular surface of the anterior mid and lower poles of the right kidney, which measure 2.2 cm and 1.5 cm in diameter. These could represent synchronous primary renal cell carcinomas of the right kidney, or metastatic disease. Two soft tissue nodules are involve the lateral portion of Gerota's fascia on the right, largest measuring 2.2 x 1.6 cm. A subcapsular cyst measuring 4 cm is also seen in the lateral interpolar region of the right kidney. Stomach/Bowel: No evidence of obstruction, inflammatory process, or abnormal fluid collections. Vascular/Lymphatic: Mild lymphadenopathy is seen in the left paraaortic region, largest measuring 10 mm on image 67/2. A 1.3 cm lymph node is seen in the deep left abdominal small bowel mesentery adjacent to the duodenum. No pelvic lymphadenopathy identified. Aortic atherosclerotic calcification noted. Left retroaortic renal vein is noted, however there is no  evidence of renal vein or IVC thrombus. Reproductive: No masses identified. Small amount of gas noted within the endometrial cavity uterus. Other:  None. Musculoskeletal: No suspicious bone lesions identified. Multiple small masses are seen scattered throughout the abdominal wall soft tissues, consistent with metastatic disease. IMPRESSION: 10 cm left renal mass with invasion of the left posterior abdominal wall soft tissues, consistent with primary renal cell carcinoma. Adjacent metastatic soft tissue nodules also noted within the left perinephric space. Diffuse bilateral pulmonary metastases. Two small masses along the capsular surface of the right kidney, which may represent synchronous primary renal cell carcinomas or metastatic disease. Metastatic disease also involving Gerota's fascia on the right. Mild abdominal lymphadenopathy, consistent with metastatic disease. Diffuse liver metastases. Small right adrenal mass, suspicious for metastatic disease. Diffuse bilateral pulmonary metastases. 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 Electronically Signed   By: Marlaine Hind M.D.   On: 10/13/2021 17:53    ROS: Per HPI Blood pressure (!) 138/54, pulse 72, temperature 98.5 F (36.9 C), temperature source Oral, resp. rate 18, height _0  (1.626 m), weight 90.1 kg, last menstrual period 12/16/2015, SpO2 94 %.  Physical Exam: Patient is awake, A/O X 4, conversant, and in good spirits. They are in NAD and VSS. Speech is fluent and appropriate. MAEW with good strength. RLE BKA. Sensation to light touch is intact. PERLA, EOMI. CNs grossly intact.    Assessment/Plan: 56 year old female with approximately 1 month history of generalized weakness and malaise.  CT head revealed multiple masses with the largest one being a 2.7 cm and located in the left parieto-occipital lobe with an adjacent vasogenic edema that is concerning for hemorrhagic metastasis.  CT  chest, abdomen, and pelvis revealed diffuse metastasis.  On examination the patient appears to be at her baseline without any neurological deficits appreciated.  We will proceed with getting an MRI brain and make recommendations once the MRI has been completed.  She will likely be a poor candidate for surgical resection due to the diffuse disease throughout her body.   -MRI brain w/ and w/o contrast, [place], DNP, NP-C 10/14/2021, 9:25 AM    Patient seen and examined.  I formulated the above assessment and plan.

## 2021-10-14 NOTE — Consult Note (Signed)
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 CT Head showed 2.7 cm peripherally hyperdense masslike area in the LEFT parieto-occipital lobe with adjacent vasogenic edema. CT C/A/P showed 10 cm left renal mass with invasion of the left posterior abdominal wall soft tissues, consistent with primary renal cell carcinoma. Additionally noted to have lymphadenopathy, liver and pulmonary metastasis.  10/14/2021: establish care with Dr. Lorenso Courier   CHIEF COMPLAINTS/PURPOSE OF CONSULTATION:  "Metastatic Disease, Concerning for Renal Cell Cancer "  HISTORY OF PRESENTING ILLNESS:  Natalie Contreras 56 y.o. female with medical history significant for DM type II, HTN, and PAD who present for evaluation of metastatic disease (concern for renal primary). The previous records are summarized above.   On exam today [name] is accompanied by her daughter and her daughter's girlfriend.  The patient reports she has been "sick for a long time".  She notes that it has been a flulike illness that has left her weak and feeling "blah".  She notes that she is also has some dizziness and lightheadedness though has not had any falls.  She has not had any focal neurological deficits, headache, or vision changes.  She notes that her symptoms have persisted for several weeks and that she finally presented to the emergency department last night due to the failure of the symptoms to resolve.  On further discussion the patient notes that she has a strong family history of cancer with lung cancer in her mother and father.  She reports that she is an active smoker smoking 1.5 packs/day and has been doing this for approximately 20  years.  She does not drink any alcohol and is currently working as a Agricultural engineer.  She notes she has not recently had any issues with fevers, chills, sweats, nausea, vomiting or diarrhea.  She notes she has "knots" on her chest which are nontender.  She does not have any pain at the moment.  A full 10 point ROS is listed below.  MEDICAL HISTORY:  Past Medical History:  Diagnosis Date   Abscess of foot 08/09/2019   WITH ULCER  RIGHT FOOT   Acute diastolic CHF (congestive heart failure) ([place]) 10/28/2016   Carotid stenosis, asymptomatic, bilateral 01/15/2017   Diabetes mellitus without complication (HCC)    Heart murmur    Hypertension    PAD (peripheral artery disease) ([place]) 04/10/2017   Left ABI 0.74. Right ABI 0.96.    SURGICAL HISTORY: Past Surgical History:  Procedure Laterality Date   ABDOMINAL AORTOGRAM W/LOWER EXTREMITY N/A 08/10/2019   Procedure: ABDOMINAL AORTOGRAM W/LOWER EXTREMITY;  Surgeon: Waynetta Sandy, MD;  Location: [facility];  Service: Cardiovascular;  Laterality: N/A;   AMPUTATION Right 09/03/2019   Procedure: RIGHT BELOW KNEE AMPUTATION;  Surgeon: Newt Minion, MD;  Location: [facility];  Service: Orthopedics;  Laterality: Right;   AMPUTATION TOE Left 05/05/2017   Procedure: LEFT SECOND TOE AMPUTATION;  Surgeon: Aviva Signs, MD;  Location: [facility];  Service: General;  Laterality: Left;   CATARACT EXTRACTION W/PHACO Left 02/08/2016   Procedure: CATARACT EXTRACTION PHACO AND INTRAOCULAR LENS PLACEMENT ([place]);  Surgeon: Tonny Branch, MD;  Location: [facility];  Service: Ophthalmology;  Laterality: Left;  CDE 11.43    CATARACT EXTRACTION W/PHACO Right 02/26/2016  Procedure: CATARACT EXTRACTION PHACO AND INTRAOCULAR LENS PLACEMENT RIGHT; CDE:  16.90;  Surgeon: Tonny Branch, MD;  Location: [facility];  Service: Ophthalmology;  Laterality: Right;   CHOLECYSTECTOMY     FOREIGN BODY REMOVAL Left 04/09/2017   Procedure: FOREIGN BODY REMOVAL ADULT FOOT;  Surgeon: Aviva Signs, MD;   Location: [facility];  Service: General;  Laterality: Left;   I & D EXTREMITY Right 08/11/2019   Procedure: IRRIGATION AND DEBRIDEMENT EXTREMITY;  Surgeon: Newt Minion, MD;  Location: [facility];  Service: Orthopedics;  Laterality: Right;   PERIPHERAL VASCULAR ATHERECTOMY Right 08/10/2019   Procedure: PERIPHERAL VASCULAR ATHERECTOMY;  Surgeon: Waynetta Sandy, MD;  Location: [facility];  Service: Cardiovascular;  Laterality: Right;  superficial femoral   PERIPHERAL VASCULAR BALLOON ANGIOPLASTY Right 08/10/2019   Procedure: PERIPHERAL VASCULAR BALLOON ANGIOPLASTY;  Surgeon: Waynetta Sandy, MD;  Location: [facility];  Service: Cardiovascular;  Laterality: Right;  anterior tibial   TRANSMETATARSAL AMPUTATION Left 07/21/2017   Procedure: TRANSMETATARSAL AMPUTATION LEFT FOOT;  Surgeon: Aviva Signs, MD;  Location: [facility];  Service: General;  Laterality: Left;    SOCIAL HISTORY: Social History   Socioeconomic History   Marital status: Single    Spouse name: Not on file   Number of children: Not on file   Years of education: Not on file   Highest education level: Not on file  Occupational History   Not on file  Tobacco Use   Smoking status: Every Day    Packs/day: 0.50    Years: 25.00    Pack years: 12.50    Types: Cigarettes    Start date: 09/16/1984   Smokeless tobacco: Never  Vaping Use   Vaping Use: Never used  Substance and Sexual Activity   Alcohol use: No   Drug use: No   Sexual activity: Yes    Birth control/protection: None  Other Topics Concern   Not on file  Social History Narrative   Not on file   Social Determinants of Health   Financial Resource Strain: Not on file  Food Insecurity: Not on file  Transportation Needs: Not on file  Physical Activity: Not on file  Stress: Not on file  Social Connections: Not on file  Intimate Partner Violence: Not on file    FAMILY HISTORY: Family History  Problem Relation Age of Onset   Lung  cancer Mother    Lung cancer Father    AAA (abdominal aortic aneurysm) Brother     ALLERGIES:  has No Known Allergies.  MEDICATIONS:  Current Facility-Administered Medications  Medication Dose Route Frequency Provider Last Rate Last Admin   insulin aspart (novoLOG) injection 0-15 Units  0-15 Units Subcutaneous Q4H Truett Mainland, DO   2 Units at 10/14/21 1205   insulin detemir (LEVEMIR) injection 25 Units  25 Units Subcutaneous QHS Truett Mainland, DO   25 Units at 10/13/21 2234   ondansetron (ZOFRAN) tablet 4 mg  4 mg Oral Q6H PRN Truett Mainland, DO       Or   ondansetron [facility]) injection 4 mg  4 mg Intravenous Q6H PRN Truett Mainland, DO       oxyCODONE (Oxy IR/ROXICODONE) immediate release tablet 5 mg  5 mg Oral Daily PRN Truett Mainland, DO       [START ON 10/17/2021] pantoprazole (PROTONIX) injection 40 mg  40 mg Intravenous Q12H Stinson, Jacob J, DO       pantoprozole (PROTONIX) 80 mg /NS 100 mL  infusion  8 mg/hr Intravenous Continuous Truett Mainland, DO 10 mL/hr at 10/14/21 0252 8 mg/hr at 10/14/21 0252   simvastatin (ZOCOR) tablet 40 mg  40 mg Oral QHS Truett Mainland, DO   40 mg at 10/13/21 2232    REVIEW OF SYSTEMS:   Constitutional: ( - ) fevers, ( - )  chills , ( - ) night sweats Eyes: ( - ) blurriness of vision, ( - ) double vision, ( - ) watery eyes Ears, nose, mouth, throat, and face: ( - ) mucositis, ( - ) sore throat Respiratory: ( - ) cough, ( - ) dyspnea, ( - ) wheezes Cardiovascular: ( - ) palpitation, ( - ) chest discomfort, ( - ) lower extremity swelling Gastrointestinal:  ( - ) nausea, ( - ) heartburn, ( - ) change in bowel habits Skin: ( - ) abnormal skin rashes Lymphatics: ( - ) new lymphadenopathy, ( - ) easy bruising Neurological: ( - ) numbness, ( - ) tingling, ( - ) new weaknesses Behavioral/Psych: ( - ) mood change, ( - ) new changes  All other systems were reviewed with the patient and are negative.  PHYSICAL EXAMINATION: ECOG PERFORMANCE  STATUS: 2 - Symptomatic, <50% confined to bed  Vitals:   10/14/21 0501 10/14/21 0956  BP: (!) 138/54 (!) 142/44  Pulse: 72 67  Resp: 18 19  Temp: 98.5 F (36.9 C) 97.6 F (36.4 C)  SpO2: 94% 100%   Filed Weights   10/13/21 1331 10/13/21 2145  Weight: 205 lb 0.4 oz (93 kg) 198 lb 10.2 oz (90.1 kg)    GENERAL: well appearing middle-aged Caucasian female in NAD  SKIN: skin color, texture, turgor are normal, no rashes or significant lesions EYES: conjunctiva are pink and non-injected, sclera clear  LUNGS: clear to auscultation and percussion with normal breathing effort HEART: regular rate & rhythm and no murmurs and no lower extremity edema Musculoskeletal: no cyanosis of digits and no clubbing  PSYCH: alert & oriented x 3, fluent speech NEURO: no focal motor/sensory deficits  LABORATORY DATA:  I have reviewed the data as listed CBC Latest Ref Rng & Units 10/14/2021 10/13/2021 09/15/2019  WBC 4.0 - 10.5 K/uL 5.1 5.6 6.8  Hemoglobin 12.0 - 15.0 g/dL 7.4(L) 6.2(LL) 9.0(L)  Hematocrit 36.0 - 46.0 % 24.3(L) 21.5(L) 28.7(L)  Platelets 150 - 400 K/uL 338 388 309    CMP Latest Ref Rng & Units 10/14/2021 10/13/2021 09/13/2019  Glucose 70 - 99 mg/dL 104(H) 113(H) 206(H)  BUN 6 - 20 mg/dL 67(H) 70(H) 48(H)  Creatinine 0.44 - 1.00 mg/dL 1.80(H) 1.78(H) 1.18(H)  Sodium 135 - 145 mmol/L 140 138 141  Potassium 3.5 - 5.1 mmol/L 3.4(L) 3.1(L) 4.7  Chloride 98 - 111 mmol/L 106 101 101  CO2 22 - 32 mmol/L 22 25 29   Calcium 8.9 - 10.3 mg/dL 8.3(L) 8.8(L) 8.6(L)  Total Protein 6.5 - 8.1 g/dL - 6.5 -  Total Bilirubin 0.3 - 1.2 mg/dL - 0.6 -  Alkaline Phos 38 - 126 U/L - 107 -  AST 15 - 41 U/L - 11(L) -  ALT 0 - 44 U/L - 11 -    RADIOGRAPHIC STUDIES: I have personally reviewed the radiological images as listed and agreed with the findings in the report: Mass in left kidney, pulmonary nodules, extensive metastases in the liver, and subcutaneous nodules in the chest.  Consistent with metastatic  renal cell cancer.  DG Chest 2 View  Result Date: 10/13/2021 CLINICAL DATA:  smoker.  weakness. rule out infection/malignancy EXAM: CHEST - 2 VIEW COMPARISON:  April 07, 2017 FINDINGS: The cardiomediastinal silhouette is enlarged in contour.Atherosclerotic calcifications. No pleural effusion. No pneumothorax. Multiple nodular opacities throughout bilateral lungs. Surgical clips project over the upper abdomen. Suggestion of sclerosis of several vertebral bodies. IMPRESSION: 1. Multiple nodular opacities bilaterally. Suggestion of multifocal sclerosis of the vertebral bodies versus summation artifact. Findings are concerning for multifocal metastases. Recommend further dedicated evaluation with cross-sectional imaging. Electronically Signed   By: Valentino Saxon M.D.   On: 10/13/2021 15:29   CT Head Wo Contrast  Result Date: 10/13/2021 CLINICAL DATA:  dizziness. blurred vision. EXAM: CT HEAD WITHOUT CONTRAST TECHNIQUE: Contiguous axial images were obtained from the base of the skull through the vertex without intravenous contrast. RADIATION DOSE REDUCTION: This exam was performed according to the departmental dose-optimization program which includes automated exposure control, adjustment of the mA and/or kV according to patient size and/or use of iterative reconstruction technique. COMPARISON:  February 17, 2017. FINDINGS: Brain: There is a lesion in the LEFT parieto-occipital lobe with adjacent vasogenic edema and a peripheral rim of intrinsic hyperdensity. It measures 2.6 x 2.4 by 2.7 cm. There is an additional intrinsically hyperdense region along the RIGHT frontoparietal falx which measures 7 by 5 mm which is concerning for an additional site of disease (series 2, image 23). Hyperdensity of the LEFT frontal lobe measuring approximately 5 mm likely reflects an additional site of disease (series 2, image 25). Hypodensity of the RIGHT occipital lobe with a small amount of intrinsic hyperdensity likely reflecting  additional lesion (series 2, image 12; series 5, image 28). Hypodensity of the LEFT inferior cerebellum, consistent with the sequela of remote prior infarction and similar comparison to prior. No significant midline shift. Vascular: Vascular calcifications of the carotid siphons. Skull: No acute fracture. Sinuses/Orbits: LEFT mastoid effusion. Other: None. IMPRESSION: 1. There is a 2.7 cm peripherally hyperdense masslike area in the LEFT parieto-occipital lobe with adjacent vasogenic edema. 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 Recommend further evaluation with dedicated brain MRI with and without contrast. 2. There are likely additional lesions in the bilateral cerebral hemispheres. These results were called by telephone at the time of interpretation on 10/13/2021 at 3:20 pm to provider MARGAUX VENTER , who verbally acknowledged these results. Electronically Signed   By: Valentino Saxon M.D.   On: 10/13/2021 15:28   CT CHEST ABDOMEN PELVIS W CONTRAST  Result Date: 10/13/2021 CLINICAL DATA:  Brain metastasis of unknown primary. EXAM: CT CHEST, ABDOMEN, AND PELVIS WITH CONTRAST TECHNIQUE: Multidetector CT imaging of the chest, abdomen and pelvis was performed following the standard protocol during bolus administration of intravenous contrast. RADIATION DOSE REDUCTION: This exam was performed according to the departmental dose-optimization program which includes automated exposure control, adjustment of the mA and/or kV according to patient size and/or use of iterative reconstruction technique. CONTRAST:  53mL OMNIPAQUE IOHEXOL 300 MG/ML  SOLN COMPARISON:  None. FINDINGS: CT CHEST FINDINGS Cardiovascular: Mild cardiomegaly. Aortic and coronary atherosclerotic calcification noted. Mediastinum/Lymph Nodes: Mild bilateral hilar lymphadenopathy is seen. No pathologically enlarged mediastinal or axillary lymph nodes are identified. Lungs/Pleura: Multiple solid bilateral pulmonary nodules are seen, consistent  with diffuse pulmonary metastases. Index nodule in the medial right lower lobe measures 2.9 x 1.7 cm on image 92/3. Index nodule in the posterolateral left lower lobe measures 4.4 x 2.2 cm on image 109/3. Small left pleural effusion also seen. Musculoskeletal: Multiple chest wall soft tissue masses are seen bilaterally, involving the breasts, chest wall  muscles and subcutaneous tissues. The 2 largest masses involve the right pectoralis major muscle, measuring 3.3 x 2.9 cm on image 12/2, and the right supraspinatus muscle measuring 5.7 x 3.2 cm on image 21/2. CT ABDOMEN AND PELVIS FINDINGS Hepatobiliary: Multiple hypovascular masses are seen throughout the right and left hepatic lobes, largest in the lateral dome of the right hepatic lobe measuring 10.7 x 5.1 cm. These are consistent diffuse liver metastases. Prior cholecystectomy. No evidence of biliary obstruction. Pancreas:  No mass or inflammatory changes. Spleen:  Within normal limits in size and appearance. Adrenals/Urinary tract: A small right adrenal mass is seen measuring 2.0 x 1.5 cm, which is indeterminate but suspicious for adrenal metastasis. A large heterogeneously enhancing mass is seen involving the posterior left kidney, which shows invasion of the posterior abdominal wall soft tissues. This measures 10.3 x 8.9 cm, and is consistent with primary renal cell carcinoma. Smaller enhancing soft tissue nodules are also seen in the left posterior pararenal space, consistent with metastatic disease. Two small masses are seen along the capsular surface of the anterior mid and lower poles of the right kidney, which measure 2.2 cm and 1.5 cm in diameter. These could represent synchronous primary renal cell carcinomas of the right kidney, or metastatic disease. Two soft tissue nodules are involve the lateral portion of Gerota's fascia on the right, largest measuring 2.2 x 1.6 cm. A subcapsular cyst measuring 4 cm is also seen in the lateral interpolar region of  the right kidney. Stomach/Bowel: No evidence of obstruction, inflammatory process, or abnormal fluid collections. Vascular/Lymphatic: Mild lymphadenopathy is seen in the left paraaortic region, largest measuring 10 mm on image 67/2. A 1.3 cm lymph node is seen in the deep left abdominal small bowel mesentery adjacent to the duodenum. No pelvic lymphadenopathy identified. Aortic atherosclerotic calcification noted. Left retroaortic renal vein is noted, however there is no evidence of renal vein or IVC thrombus. Reproductive: No masses identified. Small amount of gas noted within the endometrial cavity uterus. Other:  None. Musculoskeletal: No suspicious bone lesions identified. Multiple small masses are seen scattered throughout the abdominal wall soft tissues, consistent with metastatic disease. IMPRESSION: 10 cm left renal mass with invasion of the left posterior abdominal wall soft tissues, consistent with primary renal cell carcinoma. Adjacent metastatic soft tissue nodules also noted within the left perinephric space. Diffuse bilateral pulmonary metastases. Two small masses along the capsular surface of the right kidney, which may represent synchronous primary renal cell carcinomas or metastatic disease. Metastatic disease also involving Gerota's fascia on the right. Mild abdominal lymphadenopathy, consistent with metastatic disease. Diffuse liver metastases. Small right adrenal mass, suspicious for metastatic disease. Diffuse bilateral pulmonary metastases. 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 Electronically Signed   By: Marlaine Hind M.D.   On: 10/13/2021 17:53    ASSESSMENT & PLAN NEEKA URISTA 55 y.o. female with medical history significant for DM type II, HTN, and PAD who present for evaluation of metastatic disease (concern for renal primary).  After review of the labs, review of the records, and discussion with the patient the patients  findings are most consistent with metastatic cancer, most consistent with renal cell carcinoma.  At this time we agree with consultation with neurosurgery for consideration of resection of the brain metastasis for therapeutic and diagnostic purposes.  If neurosurgery declines to have this performed would recommend consideration of radiation oncology consult.  Patient also has a marked microcytic anemia which required blood  transfusion on arrival.  Potentially due to GI bleeding as stool was Hemoccult positive.  May be due to metastatic disease to the bowel.  Would recommend iron studies in order to confirm that iron deficiency is the cause of her current findings.  Oncology will continue to follow but tissue diagnosis is essential while the patient is in house.  If we are not able to obtain tissue with neurosurgical resection of brain tumor, debulking nephrectomy of urology then would recommend IR guided biopsy of liver lesion.  #Metastatic Disease Concerning for RCC -- Agree with pursuing MRI of the brain as scheduled --If neurosurgery decides against resection of brain metastasis and urology declines nephrectomy would recommend IR guided biopsy of liver lesion to confirm diagnosis. --Briefly discussed with the patient the possible treatment options for metastatic RCC.  Emphasized to the patient that immunotherapy is typically the backbone of treatment. --We will defer discussion regarding prognosis until tissue biopsy is made.  If this is RCC patient would have a poor prognosis. --Oncology will continue to follow.  #Microcytic Anemia -- Patient presented with hemoglobin of 6.2 with heme positive stools. --Gastroenterology has been consulted. --Recommend full iron panel and anemia work-up to include iron panel, ferritin, and erythropoietin levels  # Brain Metastasis -- Neurosurgery consulted, will await their recommendations regarding treatment moving forward --Consider radiation oncology  consultation if neurosurgery declines surgical intervention.  All questions were answered. The patient knows to call the clinic with any problems, questions or concerns.  A total of more than 80 minutes were spent on this encounter with face-to-face time and non-face-to-face time, including preparing to see the patient, ordering tests and/or medications, counseling the patient and coordination of care as outlined above.   Ledell Peoples, MD Department of Hematology/Oncology [place] [facility] Phone: [phone] Pager: [phone] Email: [email or web address]  10/14/2021 12:17 PM

## 2021-10-14 NOTE — Progress Notes (Signed)
PROGRESS NOTE  Natalie Contreras ZOX:[id number] DOB: Feb 08, 1966   PCP: Celene Squibb, MD  Patient is from: Home.  Lives with his friend.  DOA: 10/13/2021 LOS: 1  Chief complaints:  Chief Complaint  Patient presents with   Weakness    Generalized      Brief Narrative / Interim history: 56 year old F with PMH of DM-2 with neuropathy, diastolic CHF, HTN, PAD, right BKA and tobacco use disorder presenting to [facility] with generalized weakness for 2 months, melena and enlarging tender nodes on head and neck, chest and back, and admitted for symptomatic anemia and 10 cm left renal mass with invasion of the left posterior abdominal wall soft tissue concerning for RCC with widespread metastasis including 2.7 cm peripheral hyperdense masslike area in the left parietal occipital lobe with vasogenic edema and concern for hemorrhagic metastasis.  Hgb 6.2.  Hemoccult positive.  Two units of RBC ordered.  GI and neurosurgery consulted, and patient was admitted to [facility] for further evaluation.  The next day, urology and oncology consulted.  Subjective: Seen and examined earlier this morning.  No major events overnight of this morning.  Feels better from weakness standpoint.  She denies pain.  Devastated by CT finding with concern for metastatic cancer.  Objective: Vitals:   10/13/21 2213 10/14/21 0214 10/14/21 0501 10/14/21 0956  BP: 128/76 136/81 (!) 138/54 (!) 142/44  Pulse: 78 72 72 67  Resp: 18 18 18 19   Temp: 98.3 F (36.8 C) 97.9 F (36.6 C) 98.5 F (36.9 C) 97.6 F (36.4 C)  TempSrc: Oral Oral Oral Oral  SpO2: 96% 96% 94% 100%  Weight:      Height:        Examination:  GENERAL: No apparent distress.  Nontoxic. HEENT: MMM.  Vision and hearing grossly intact.  NECK: Supple.  No apparent JVD.  RESP:  No IWOB.  Fair aeration bilaterally. CVS:  RRR. Heart sounds normal.  ABD/GI/GU: BS+. Abd soft, NTND.  MSK/EXT:  Moves extremities.  Right BKA.  Wears prosthetic. SKIN: Immobile  nodes over right leg posteriorly, bilateral chest and lower backs. NEURO: Awake, alert and oriented appropriately.  No apparent focal neuro deficit. PSYCH: Calm. Normal affect.   Procedures:  None  Microbiology summarized: [id number] and influenza PCR nonreactive.  Assessment & Plan: Symptomatic anemia: Could be due to upper GI bleed and RCC.  Hgb 9.0 in 08/2019.  Patient reports melena for about 2 weeks.  She used about 10 to 12 tablets of Aleve the day prior to admission but denies chronic NSAID use other than low-dose aspirin.  Now concern for metastatic RCC.  Transfused 2 units.  Recent Labs    10/13/21 1428 10/14/21 0221  HGB 6.2* 7.4*  -Monitor H&H -Follow GI recommendations. -Continue PPI -Transfuse for Hgb less than 7.0.  Left renal mass with concern for metastatic RCC: CT abdomen and pelvis showed 10 cm left renal mass with invasion of the left posterior abdominal wall soft tissue concerning for RCC with widespread metastasis including 2.7 cm peripheral hyperdense masslike area in the left parietal occipital lobe with vasogenic edema and concern for hemorrhagic metastasis.  She had widespread nodules on her neck, chest and back. -Urology, oncology and neurosurgery consulted -May need steroid for vasogenic edema but defer this to neurosurgery.  Chronic diastolic CHF: TTE in 8295 with LVEF of 55 to 60%, G2 DD and RVSP of 49 mmHg.  On torsemide at home.  Appears euvolemic on exam. -Hold torsemide -Monitor fluid and  respiratory status  Controlled diabetes with hyperglycemia, neuropathy and other complications: W6F 6.8%. Recent Labs  Lab 10/13/21 1525 10/13/21 2155 10/14/21 0011 10/14/21 0251 10/14/21 0841  GLUCAP 106* 175* 137* 112* 90  -Monitor CBG -Continue SSI and statin  AKI/azotemia on CKD-3A: Cr 1.18 in 08/2019.  No interval value for comparison.  She admits to NSAID use. Recent Labs    10/13/21 1428 10/14/21 0221  BUN 70* 67*  CREATININE 1.78* 1.80*   -Continue monitoring -Continue IV fluid  Essential hypertension  Hypokalemia: K3.4. -P.o. KCl 40x1  Body mass index is 34.1 kg/m.         DVT prophylaxis:  SCDs Start: 10/13/21 2154  Code Status: Full code Family Communication: Patient and/or RN. Available if any question.  Level of care: Telemetry Medical Status is: Inpatient  Remains inpatient appropriate because: Evaluation and treatment of symptomatic anemia in the setting of GI bleed and metastatic renal cancer       Consultants:  Neurosurgery Gastroenterology Oncology Urology   Sch Meds:  Scheduled Meds:  insulin aspart  0-15 Units Subcutaneous Q4H   insulin detemir  25 Units Subcutaneous QHS   [START ON 10/17/2021] pantoprazole  40 mg Intravenous Q12H   simvastatin  40 mg Oral QHS   Continuous Infusions:  pantoprazole 8 mg/hr (10/14/21 0252)   PRN Meds:.ondansetron **OR** ondansetron (ZOFRAN) IV, oxyCODONE  Antimicrobials: Anti-infectives (From admission, onward)    None        I have personally reviewed the following labs and images: CBC: Recent Labs  Lab 10/13/21 1428 10/14/21 0221  WBC 5.6 5.1  NEUTROABS 3.7  --   HGB 6.2* 7.4*  HCT 21.5* 24.3*  MCV 79.9* 79.4*  PLT 388 338   BMP &GFR Recent Labs  Lab 10/13/21 1428 10/14/21 0221  NA 138 140  K 3.1* 3.4*  CL 101 106  CO2 25 22  GLUCOSE 113* 104*  BUN 70* 67*  CREATININE 1.78* 1.80*  CALCIUM 8.8* 8.3*   Estimated Creatinine Clearance: 38.9 mL/min (A) (by C-G formula based on SCr of 1.8 mg/dL (H)). Liver & Pancreas: Recent Labs  Lab 10/13/21 1428  AST 11*  ALT 11  ALKPHOS 107  BILITOT 0.6  PROT 6.5  ALBUMIN 3.0*   No results for input(s): LIPASE, AMYLASE in the last 168 hours. No results for input(s): AMMONIA in the last 168 hours. Diabetic: Recent Labs    10/13/21 2254  HGBA1C 5.4   Recent Labs  Lab 10/13/21 1525 10/13/21 2155 10/14/21 0011 10/14/21 0251 10/14/21 0841  GLUCAP 106* 175* 137* 112* 90    Cardiac Enzymes: No results for input(s): CKTOTAL, CKMB, CKMBINDEX, TROPONINI in the last 168 hours. No results for input(s): PROBNP in the last 8760 hours. Coagulation Profile: No results for input(s): INR, PROTIME in the last 168 hours. Thyroid Function Tests: No results for input(s): TSH, T4TOTAL, FREET4, T3FREE, THYROIDAB in the last 72 hours. Lipid Profile: No results for input(s): CHOL, HDL, LDLCALC, TRIG, CHOLHDL, LDLDIRECT in the last 72 hours. Anemia Panel: No results for input(s): VITAMINB12, FOLATE, FERRITIN, TIBC, IRON, RETICCTPCT in the last 72 hours. Urine analysis:    Component Value Date/Time   COLORURINE YELLOW 09/12/2019 [place] 09/12/2019 0658   LABSPEC 1.012 09/12/2019 0658   PHURINE 6.0 09/12/2019 0658   GLUCOSEU >=500 (A) 09/12/2019 0658   HGBUR SMALL (A) 09/12/2019 0658   BILIRUBINUR NEGATIVE 09/12/2019 [place] 09/12/2019 0658   PROTEINUR 100 (A) 09/12/2019 1275  NITRITE NEGATIVE 09/12/2019 0658   LEUKOCYTESUR SMALL (A) 09/12/2019 0658   Sepsis Labs: Invalid input(s): PROCALCITONIN, [place]  Microbiology: Recent Results (from the past 240 hour(s))  Resp Panel by RT-PCR (Flu A&B, Covid) Nasopharyngeal Swab     Status: None   Collection Time: 10/13/21  2:35 PM   Specimen: Nasopharyngeal Swab; Nasopharyngeal(NP) swabs in vial transport medium  Result Value Ref Range Status   SARS Coronavirus 2 by RT PCR NEGATIVE NEGATIVE Final    Comment: (NOTE) SARS-CoV-2 target nucleic acids are NOT DETECTED.  The SARS-CoV-2 RNA is generally detectable in upper respiratory specimens during the acute phase of infection. The lowest concentration of SARS-CoV-2 viral copies this assay can detect is 138 copies/mL. A negative result does not preclude SARS-Cov-2 infection and should not be used as the sole basis for treatment or other patient management decisions. A negative result may occur with  improper specimen  collection/handling, submission of specimen other than nasopharyngeal swab, presence of viral mutation(s) within the areas targeted by this assay, and inadequate number of viral copies(<138 copies/mL). A negative result must be combined with clinical observations, patient history, and epidemiological information. The expected result is Negative.  Fact Sheet for Patients:  EntrepreneurPulse.com.au  Fact Sheet for Healthcare Providers:  IncredibleEmployment.be  This test is no t yet approved or cleared by the [place] FDA and  has been authorized for detection and/or diagnosis of SARS-CoV-2 by FDA under an Emergency Use Authorization (EUA). This EUA will remain  in effect (meaning this test can be used) for the duration of the COVID-19 declaration under Section 564(b)(1) of the Act, 21 U.S.C.section 360bbb-3(b)(1), unless the authorization is terminated  or revoked sooner.       Influenza A by PCR NEGATIVE NEGATIVE Final   Influenza B by PCR NEGATIVE NEGATIVE Final    Comment: (NOTE) The Xpert Xpress SARS-CoV-2/FLU/RSV plus assay is intended as an aid in the diagnosis of influenza from Nasopharyngeal swab specimens and should not be used as a sole basis for treatment. Nasal washings and aspirates are unacceptable for Xpert Xpress SARS-CoV-2/FLU/RSV testing.  Fact Sheet for Patients: EntrepreneurPulse.com.au  Fact Sheet for Healthcare Providers: IncredibleEmployment.be  This test is not yet approved or cleared by the [place] FDA and has been authorized for detection and/or diagnosis of SARS-CoV-2 by FDA under an Emergency Use Authorization (EUA). This EUA will remain in effect (meaning this test can be used) for the duration of the COVID-19 declaration under Section 564(b)(1) of the Act, 21 U.S.C. section 360bbb-3(b)(1), unless the authorization is terminated or revoked.  Performed at [facility], [address], [place], [place] [address]     Radiology Studies: DG Chest 2 View  Result Date: 10/13/2021 CLINICAL DATA:  smoker. weakness. rule out infection/malignancy EXAM: CHEST - 2 VIEW COMPARISON:  April 07, 2017 FINDINGS: The cardiomediastinal silhouette is enlarged in contour.Atherosclerotic calcifications. No pleural effusion. No pneumothorax. Multiple nodular opacities throughout bilateral lungs. Surgical clips project over the upper abdomen. Suggestion of sclerosis of several vertebral bodies. IMPRESSION: 1. Multiple nodular opacities bilaterally. Suggestion of multifocal sclerosis of the vertebral bodies versus summation artifact. Findings are concerning for multifocal metastases. Recommend further dedicated evaluation with cross-sectional imaging. Electronically Signed   By: Valentino Saxon M.D.   On: 10/13/2021 15:29   CT Head Wo Contrast  Result Date: 10/13/2021 CLINICAL DATA:  dizziness. blurred vision. EXAM: CT HEAD WITHOUT CONTRAST TECHNIQUE: Contiguous axial images were obtained from the base of the skull through the vertex without intravenous  contrast. RADIATION DOSE REDUCTION: This exam was performed according to the departmental dose-optimization program which includes automated exposure control, adjustment of the mA and/or kV according to patient size and/or use of iterative reconstruction technique. COMPARISON:  February 17, 2017. FINDINGS: Brain: There is a lesion in the LEFT parieto-occipital lobe with adjacent vasogenic edema and a peripheral rim of intrinsic hyperdensity. It measures 2.6 x 2.4 by 2.7 cm. There is an additional intrinsically hyperdense region along the RIGHT frontoparietal falx which measures 7 by 5 mm which is concerning for an additional site of disease (series 2, image 23). Hyperdensity of the LEFT frontal lobe measuring approximately 5 mm likely reflects an additional site of disease (series 2, image 25). Hypodensity of the RIGHT occipital lobe with a  small amount of intrinsic hyperdensity likely reflecting additional lesion (series 2, image 12; series 5, image 28). Hypodensity of the LEFT inferior cerebellum, consistent with the sequela of remote prior infarction and similar comparison to prior. No significant midline shift. Vascular: Vascular calcifications of the carotid siphons. Skull: No acute fracture. Sinuses/Orbits: LEFT mastoid effusion. Other: None. IMPRESSION: 1. There is a 2.7 cm peripherally hyperdense masslike area in the LEFT parieto-occipital lobe with adjacent vasogenic edema. 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 Recommend further evaluation with dedicated brain MRI with and without contrast. 2. There are likely additional lesions in the bilateral cerebral hemispheres. These results were called by telephone at the time of interpretation on 10/13/2021 at 3:20 pm to provider MARGAUX VENTER , who verbally acknowledged these results. Electronically Signed   By: Valentino Saxon M.D.   On: 10/13/2021 15:28   CT CHEST ABDOMEN PELVIS W CONTRAST  Result Date: 10/13/2021 CLINICAL DATA:  Brain metastasis of unknown primary. EXAM: CT CHEST, ABDOMEN, AND PELVIS WITH CONTRAST TECHNIQUE: Multidetector CT imaging of the chest, abdomen and pelvis was performed following the standard protocol during bolus administration of intravenous contrast. RADIATION DOSE REDUCTION: This exam was performed according to the departmental dose-optimization program which includes automated exposure control, adjustment of the mA and/or kV according to patient size and/or use of iterative reconstruction technique. CONTRAST:  107mL OMNIPAQUE IOHEXOL 300 MG/ML  SOLN COMPARISON:  None. FINDINGS: CT CHEST FINDINGS Cardiovascular: Mild cardiomegaly. Aortic and coronary atherosclerotic calcification noted. Mediastinum/Lymph Nodes: Mild bilateral hilar lymphadenopathy is seen. No pathologically enlarged mediastinal or axillary lymph nodes are identified. Lungs/Pleura:  Multiple solid bilateral pulmonary nodules are seen, consistent with diffuse pulmonary metastases. Index nodule in the medial right lower lobe measures 2.9 x 1.7 cm on image 92/3. Index nodule in the posterolateral left lower lobe measures 4.4 x 2.2 cm on image 109/3. Small left pleural effusion also seen. Musculoskeletal: Multiple chest wall soft tissue masses are seen bilaterally, involving the breasts, chest wall muscles and subcutaneous tissues. The 2 largest masses involve the right pectoralis major muscle, measuring 3.3 x 2.9 cm on image 12/2, and the right supraspinatus muscle measuring 5.7 x 3.2 cm on image 21/2. CT ABDOMEN AND PELVIS FINDINGS Hepatobiliary: Multiple hypovascular masses are seen throughout the right and left hepatic lobes, largest in the lateral dome of the right hepatic lobe measuring 10.7 x 5.1 cm. These are consistent diffuse liver metastases. Prior cholecystectomy. No evidence of biliary obstruction. Pancreas:  No mass or inflammatory changes. Spleen:  Within normal limits in size and appearance. Adrenals/Urinary tract: A small right adrenal mass is seen measuring 2.0 x 1.5 cm, which is indeterminate but suspicious for adrenal metastasis. A large heterogeneously enhancing mass is seen involving the posterior left  kidney, which shows invasion of the posterior abdominal wall soft tissues. This measures 10.3 x 8.9 cm, and is consistent with primary renal cell carcinoma. Smaller enhancing soft tissue nodules are also seen in the left posterior pararenal space, consistent with metastatic disease. Two small masses are seen along the capsular surface of the anterior mid and lower poles of the right kidney, which measure 2.2 cm and 1.5 cm in diameter. These could represent synchronous primary renal cell carcinomas of the right kidney, or metastatic disease. Two soft tissue nodules are involve the lateral portion of Gerota's fascia on the right, largest measuring 2.2 x 1.6 cm. A subcapsular cyst  measuring 4 cm is also seen in the lateral interpolar region of the right kidney. Stomach/Bowel: No evidence of obstruction, inflammatory process, or abnormal fluid collections. Vascular/Lymphatic: Mild lymphadenopathy is seen in the left paraaortic region, largest measuring 10 mm on image 67/2. A 1.3 cm lymph node is seen in the deep left abdominal small bowel mesentery adjacent to the duodenum. No pelvic lymphadenopathy identified. Aortic atherosclerotic calcification noted. Left retroaortic renal vein is noted, however there is no evidence of renal vein or IVC thrombus. Reproductive: No masses identified. Small amount of gas noted within the endometrial cavity uterus. Other:  None. Musculoskeletal: No suspicious bone lesions identified. Multiple small masses are seen scattered throughout the abdominal wall soft tissues, consistent with metastatic disease. IMPRESSION: 10 cm left renal mass with invasion of the left posterior abdominal wall soft tissues, consistent with primary renal cell carcinoma. Adjacent metastatic soft tissue nodules also noted within the left perinephric space. Diffuse bilateral pulmonary metastases. Two small masses along the capsular surface of the right kidney, which may represent synchronous primary renal cell carcinomas or metastatic disease. Metastatic disease also involving Gerota's fascia on the right. Mild abdominal lymphadenopathy, consistent with metastatic disease. Diffuse liver metastases. Small right adrenal mass, suspicious for metastatic disease. Diffuse bilateral pulmonary metastases. 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 Electronically Signed   By: Marlaine Hind M.D.   On: 10/13/2021 17:53      Jhaden Pizzuto T. Wheaton  If 7PM-7AM, please contact night-coverage [email or web address] 10/14/2021, 10:16 AM

## 2021-10-14 NOTE — Progress Notes (Signed)
°  Transition of Care [facility]) Screening Note   Patient Details  Name: Natalie Contreras Date of Birth: 11-23-1965   Transition of Care [facility]) CM/SW Contact:    Bartholomew Crews, RN Phone Number: [phone] 10/14/2021, 12:38 PM    Transition of Care Department [facility]) has reviewed patient and no TOC needs have been identified at this time. We will continue to monitor patient advancement through interdisciplinary progression rounds. If new patient transition needs arise, please place a TOC consult.

## 2021-10-14 NOTE — Consult Note (Addendum)
Referring Provider: ? Primary Care Physician:  Celene Squibb, MD Primary Gastroenterologist:  Althia Forts  Reason for Consultation:  Anemia and heme positive stool  HPI: AHSLEY Contreras is a 56 y.o. female with a history of hypertension, CHF, diabetes with poor control who presented for evaluation of weakness that has been going on for over the past couple of months.    Additionally, she complained of a knot in the side of her neck, back, chest that started off like pea-sized and has increased in size and now are the size of large grapes.  They are tender and cause a headache, especially the one in her right neck.  She reporting taking 12 or so Aleve on 1/28.  Hemoglobin was 6.2 g on arrival compared to 9.0 g about 2 years ago.  This morning after 2 units of packed red blood cells it is up to 7.4 grams.  She was found to be heme positive.  GI was consulted.   CT scan of the head:  IMPRESSION: 1. There is a 2.7 cm peripherally hyperdense masslike area in the LEFT parieto-occipital lobe with adjacent vasogenic edema. 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 Recommend further evaluation with dedicated brain MRI with and without contrast. 2. There are likely additional lesions in the bilateral cerebral hemispheres.  CT scan of the chest, abdomen, pelvis:  IMPRESSION: 10 cm left renal mass with invasion of the left posterior abdominal wall soft tissues, consistent with primary renal cell carcinoma. Adjacent metastatic soft tissue nodules also noted within the left perinephric space. Diffuse bilateral pulmonary metastases.   Two small masses along the capsular surface of the right kidney, which may represent synchronous primary renal cell carcinomas or metastatic disease. Metastatic disease also involving Gerota's fascia on the right.   Mild abdominal lymphadenopathy, consistent with metastatic disease.   Diffuse liver metastases.   Small right adrenal mass, suspicious for  metastatic disease.   Diffuse bilateral pulmonary metastases. Bilateral hilar lymph node metastases.   Diffuse chest and abdominal wall soft tissue metastases.   She tells me that yesterday was the only day that she took the Aleve.  She had not been using Aleve continuously up to that point.  She does say that her stools have been dark.  She says that overall she has just felt bad for the past month or more.  Past Medical History:  Diagnosis Date   Abscess of foot 08/09/2019   WITH ULCER  RIGHT FOOT   Acute diastolic CHF (congestive heart failure) ([place]) 10/28/2016   Carotid stenosis, asymptomatic, bilateral 01/15/2017   Diabetes mellitus without complication (HCC)    Heart murmur    Hypertension    PAD (peripheral artery disease) ([place]) 04/10/2017   Left ABI 0.74. Right ABI 0.96.    Past Surgical History:  Procedure Laterality Date   ABDOMINAL AORTOGRAM W/LOWER EXTREMITY N/A 08/10/2019   Procedure: ABDOMINAL AORTOGRAM W/LOWER EXTREMITY;  Surgeon: Waynetta Sandy, MD;  Location: [facility];  Service: Cardiovascular;  Laterality: N/A;   AMPUTATION Right 09/03/2019   Procedure: RIGHT BELOW KNEE AMPUTATION;  Surgeon: Newt Minion, MD;  Location: [facility];  Service: Orthopedics;  Laterality: Right;   AMPUTATION TOE Left 05/05/2017   Procedure: LEFT SECOND TOE AMPUTATION;  Surgeon: Aviva Signs, MD;  Location: [facility];  Service: General;  Laterality: Left;   CATARACT EXTRACTION W/PHACO Left 02/08/2016   Procedure: CATARACT EXTRACTION PHACO AND INTRAOCULAR LENS PLACEMENT ([place]);  Surgeon: Tonny Branch, MD;  Location: [facility];  Service: Ophthalmology;  Laterality: Left;  CDE 11.43    CATARACT EXTRACTION W/PHACO Right 02/26/2016   Procedure: CATARACT EXTRACTION PHACO AND INTRAOCULAR LENS PLACEMENT RIGHT; CDE:  16.90;  Surgeon: Tonny Branch, MD;  Location: [facility];  Service: Ophthalmology;  Laterality: Right;   CHOLECYSTECTOMY     FOREIGN BODY REMOVAL Left 04/09/2017   Procedure: FOREIGN  BODY REMOVAL ADULT FOOT;  Surgeon: Aviva Signs, MD;  Location: [facility];  Service: General;  Laterality: Left;   I & D EXTREMITY Right 08/11/2019   Procedure: IRRIGATION AND DEBRIDEMENT EXTREMITY;  Surgeon: Newt Minion, MD;  Location: [facility];  Service: Orthopedics;  Laterality: Right;   PERIPHERAL VASCULAR ATHERECTOMY Right 08/10/2019   Procedure: PERIPHERAL VASCULAR ATHERECTOMY;  Surgeon: Waynetta Sandy, MD;  Location: [facility];  Service: Cardiovascular;  Laterality: Right;  superficial femoral   PERIPHERAL VASCULAR BALLOON ANGIOPLASTY Right 08/10/2019   Procedure: PERIPHERAL VASCULAR BALLOON ANGIOPLASTY;  Surgeon: Waynetta Sandy, MD;  Location: [facility];  Service: Cardiovascular;  Laterality: Right;  anterior tibial   TRANSMETATARSAL AMPUTATION Left 07/21/2017   Procedure: TRANSMETATARSAL AMPUTATION LEFT FOOT;  Surgeon: Aviva Signs, MD;  Location: [facility];  Service: General;  Laterality: Left;    Prior to Admission medications   Medication Sig Start Date End Date Taking? Authorizing Provider  acetaminophen (TYLENOL) 325 MG tablet Take 1-2 tablets (325-650 mg total) by mouth every 4 (four) hours as needed for mild pain. 09/13/19  Yes Love, Ivan Anchors, PA-C  aspirin EC 81 MG tablet Take 1 tablet (81 mg total) by mouth daily with breakfast. 06/15/19  Yes Emokpae, Courage, MD  carvedilol (COREG) 3.125 MG tablet TAKE (1) TABLET BY MOUTH TWICE DAILY WITH A MEAL. Patient taking differently: Take 3.125 mg by mouth 2 (two) times daily with a meal. 10/20/19  Yes Jamse Arn, MD  Continuous Blood Gluc Sensor (DEXCOM G6 SENSOR) MISC SMARTSIG:1 Each Topical Every 10 Days 10/03/21  Yes [provider]  ergocalciferol (VITAMIN D2) 1.25 MG (50000 UT) capsule Take 50,000 Units by mouth once a week. Sunday   Yes [provider]  Finerenone (KERENDIA) 10 MG TABS Take 1 tablet by mouth daily.   Yes [provider]  hydrALAZINE (APRESOLINE) 50  MG tablet Take 50 mg by mouth 2 (two) times daily. 07/15/21  Yes [provider]  insulin aspart (NOVOLOG) 100 UNIT/ML injection Up to 100 units per day in omni pod. Omnipod to be filled with Novolog to be accurately injected by Omnipod based on settings.   Yes [provider]  JARDIANCE 25 MG TABS tablet Take 25 mg by mouth daily. 10/03/21  Yes [provider]  simvastatin (ZOCOR) 40 MG tablet Take 40 mg by mouth at bedtime.    Yes [provider]  torsemide (DEMADEX) 20 MG tablet Take 1 tablet (20 mg total) by mouth daily. 06/15/19  Yes Emokpae, Courage, MD  docusate sodium (COLACE) 100 MG capsule Take 1 capsule (100 mg total) by mouth 2 (two) times daily. Patient not taking: Reported on 10/13/2021 09/16/19   Love, Ivan Anchors, PA-C  metFORMIN (GLUCOPHAGE) 1000 MG tablet Take 1 tablet (1,000 mg total) by mouth daily with breakfast. Patient not taking: Reported on 10/13/2021 09/16/19   Love, Ivan Anchors, PA-C  methocarbamol (ROBAXIN) 500 MG tablet Take 1 tablet (500 mg total) by mouth every 6 (six) hours as needed for muscle spasms. Patient not taking: Reported on 10/13/2021 09/16/19   Bary Leriche, PA-C  Multiple  Vitamin (MULTIVITAMIN WITH MINERALS) TABS tablet Take 1 tablet by mouth daily. Patient not taking: Reported on 10/13/2021 09/14/19   Bary Leriche, PA-C  oxyCODONE (OXY IR/ROXICODONE) 5 MG immediate release tablet Take 1 tablet (5 mg total) by mouth daily as needed for severe pain. Patient not taking: Reported on 10/13/2021 09/16/19   Love, Ivan Anchors, PA-C  polyethylene glycol (MIRALAX / GLYCOLAX) 17 g packet Take 17 g by mouth 2 (two) times daily. Patient not taking: Reported on 10/13/2021 09/16/19   Love, Ivan Anchors, PA-C  traMADol (ULTRAM) 50 MG tablet Take 1 tablet (50 mg total) by mouth every 12 (twelve) hours as needed for moderate pain. Patient not taking: Reported on 10/13/2021 09/16/19   Love, Ivan Anchors, PA-C  traZODone (DESYREL) 50 MG tablet Take 0.5-1  tablets (25-50 mg total) by mouth at bedtime as needed for sleep. Patient not taking: Reported on 10/13/2021 09/16/19   Bary Leriche, PA-C    Current Facility-Administered Medications  Medication Dose Route Frequency Provider Last Rate Last Admin   insulin aspart (novoLOG) injection 0-15 Units  0-15 Units Subcutaneous Q4H Truett Mainland, DO   3 Units at 10/13/21 2233   insulin detemir (LEVEMIR) injection 25 Units  25 Units Subcutaneous QHS Truett Mainland, DO   25 Units at 10/13/21 2234   ondansetron (ZOFRAN) tablet 4 mg  4 mg Oral Q6H PRN Truett Mainland, DO       Or   ondansetron [facility]) injection 4 mg  4 mg Intravenous Q6H PRN Truett Mainland, DO       oxyCODONE (Oxy IR/ROXICODONE) immediate release tablet 5 mg  5 mg Oral Daily PRN Truett Mainland, DO       [START ON 10/17/2021] pantoprazole (PROTONIX) injection 40 mg  40 mg Intravenous Q12H Stinson, Jacob J, DO       pantoprozole (PROTONIX) 80 mg /NS 100 mL infusion  8 mg/hr Intravenous Continuous Truett Mainland, DO 10 mL/hr at 10/14/21 0252 8 mg/hr at 10/14/21 0252   simvastatin (ZOCOR) tablet 40 mg  40 mg Oral QHS Truett Mainland, DO   40 mg at 10/13/21 2232    Allergies as of 10/13/2021   (No Known Allergies)    Family History  Problem Relation Age of Onset   Lung cancer Mother    Lung cancer Father    AAA (abdominal aortic aneurysm) Brother     Social History   Socioeconomic History   Marital status: Single    Spouse name: Not on file   Number of children: Not on file   Years of education: Not on file   Highest education level: Not on file  Occupational History   Not on file  Tobacco Use   Smoking status: Every Day    Packs/day: 0.50    Years: 25.00    Pack years: 12.50    Types: Cigarettes    Start date: 09/16/1984   Smokeless tobacco: Never  Vaping Use   Vaping Use: Never used  Substance and Sexual Activity   Alcohol use: No   Drug use: No   Sexual activity: Yes    Birth control/protection: None   Other Topics Concern   Not on file  Social History Narrative   Not on file   Social Determinants of Health   Financial Resource Strain: Not on file  Food Insecurity: Not on file  Transportation Needs: Not on file  Physical Activity: Not on file  Stress: Not on file  Social Connections: Not on file  Intimate Partner Violence: Not on file    Review of Systems: ROS is O/W negative except as mentioned in HPI.  Physical Exam: Vital signs in last 24 hours: Temp:  [97.7 F (36.5 C)-98.5 F (36.9 C)] 98.5 F (36.9 C) (01/29 0501) Pulse Rate:  [68-78] 72 (01/29 0501) Resp:  [16-43] 18 (01/29 0501) BP: (111-151)/(23-81) 138/54 (01/29 0501) SpO2:  [94 %-99 %] 94 % (01/29 0501) Weight:  [90.1 kg-93 kg] 90.1 kg (01/28 2145) Last BM Date: 10/12/21 General:  Alert, pale, Well-developed, well-nourished, pleasant and cooperative in NAD Head:  Normocephalic and atraumatic. Eyes:  Sclera clear, no icterus.  Conjunctiva pink. Ears:  Normal auditory acuity. Mouth:  No deformity or lesions.   Lungs:  Clear throughout to auscultation.  No wheezes, crackles, or rhonchi.  Heart:  Regular rate and rhythm; no murmurs, clicks, rubs, or gallops. Abdomen:  Soft, non-distended.  BS present.  Non-tender.  There were some some tissue lesions palpated on the abdomen.  Previous cholecystectomy scar noted. Rectal:  Deferred.  Heme positive.  Msk:  Symmetrical without gross deformities. Pulses:  Normal pulses noted. Extremities:  Right BKA. Neurologic:  Alert and  oriented x4;  grossly normal neurologically. Skin:  Intact without significant lesions or rashes. Psych:  Alert and cooperative. Normal mood and affect.  Intake/Output from previous day: 01/28 0701 - 01/29 0700 In: 1128.9 [P.O.:120; I.V.:150.9; Blood:858] Out: 0  Intake/Output this shift: Total I/O In: -  Out: 200 [Urine:200]  Lab Results: Recent Labs    10/13/21 1428 10/14/21 0221  WBC 5.6 5.1  HGB 6.2* 7.4*  HCT 21.5* 24.3*   PLT 388 338   BMET Recent Labs    10/13/21 1428 10/14/21 0221  NA 138 140  K 3.1* 3.4*  CL 101 106  CO2 25 22  GLUCOSE 113* 104*  BUN 70* 67*  CREATININE 1.78* 1.80*  CALCIUM 8.8* 8.3*   LFT Recent Labs    10/13/21 1428  PROT 6.5  ALBUMIN 3.0*  AST 11*  ALT 11  ALKPHOS 107  BILITOT 0.6   Studies/Results: DG Chest 2 View  Result Date: 10/13/2021 CLINICAL DATA:  smoker. weakness. rule out infection/malignancy EXAM: CHEST - 2 VIEW COMPARISON:  April 07, 2017 FINDINGS: The cardiomediastinal silhouette is enlarged in contour.Atherosclerotic calcifications. No pleural effusion. No pneumothorax. Multiple nodular opacities throughout bilateral lungs. Surgical clips project over the upper abdomen. Suggestion of sclerosis of several vertebral bodies. IMPRESSION: 1. Multiple nodular opacities bilaterally. Suggestion of multifocal sclerosis of the vertebral bodies versus summation artifact. Findings are concerning for multifocal metastases. Recommend further dedicated evaluation with cross-sectional imaging. Electronically Signed   By: Valentino Saxon M.D.   On: 10/13/2021 15:29   CT Head Wo Contrast  Result Date: 10/13/2021 CLINICAL DATA:  dizziness. blurred vision. EXAM: CT HEAD WITHOUT CONTRAST TECHNIQUE: Contiguous axial images were obtained from the base of the skull through the vertex without intravenous contrast. RADIATION DOSE REDUCTION: This exam was performed according to the departmental dose-optimization program which includes automated exposure control, adjustment of the mA and/or kV according to patient size and/or use of iterative reconstruction technique. COMPARISON:  February 17, 2017. FINDINGS: Brain: There is a lesion in the LEFT parieto-occipital lobe with adjacent vasogenic edema and a peripheral rim of intrinsic hyperdensity. It measures 2.6 x 2.4 by 2.7 cm. There is an additional intrinsically hyperdense region along the RIGHT frontoparietal falx which measures 7 by 5 mm  which is concerning for an additional site  of disease (series 2, image 23). Hyperdensity of the LEFT frontal lobe measuring approximately 5 mm likely reflects an additional site of disease (series 2, image 25). Hypodensity of the RIGHT occipital lobe with a small amount of intrinsic hyperdensity likely reflecting additional lesion (series 2, image 12; series 5, image 28). Hypodensity of the LEFT inferior cerebellum, consistent with the sequela of remote prior infarction and similar comparison to prior. No significant midline shift. Vascular: Vascular calcifications of the carotid siphons. Skull: No acute fracture. Sinuses/Orbits: LEFT mastoid effusion. Other: None. IMPRESSION: 1. There is a 2.7 cm peripherally hyperdense masslike area in the LEFT parieto-occipital lobe with adjacent vasogenic edema. 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 Recommend further evaluation with dedicated brain MRI with and without contrast. 2. There are likely additional lesions in the bilateral cerebral hemispheres. These results were called by telephone at the time of interpretation on 10/13/2021 at 3:20 pm to provider MARGAUX VENTER , who verbally acknowledged these results. Electronically Signed   By: Valentino Saxon M.D.   On: 10/13/2021 15:28   CT CHEST ABDOMEN PELVIS W CONTRAST  Result Date: 10/13/2021 CLINICAL DATA:  Brain metastasis of unknown primary. EXAM: CT CHEST, ABDOMEN, AND PELVIS WITH CONTRAST TECHNIQUE: Multidetector CT imaging of the chest, abdomen and pelvis was performed following the standard protocol during bolus administration of intravenous contrast. RADIATION DOSE REDUCTION: This exam was performed according to the departmental dose-optimization program which includes automated exposure control, adjustment of the mA and/or kV according to patient size and/or use of iterative reconstruction technique. CONTRAST:  57mL OMNIPAQUE IOHEXOL 300 MG/ML  SOLN COMPARISON:  None. FINDINGS: CT CHEST FINDINGS  Cardiovascular: Mild cardiomegaly. Aortic and coronary atherosclerotic calcification noted. Mediastinum/Lymph Nodes: Mild bilateral hilar lymphadenopathy is seen. No pathologically enlarged mediastinal or axillary lymph nodes are identified. Lungs/Pleura: Multiple solid bilateral pulmonary nodules are seen, consistent with diffuse pulmonary metastases. Index nodule in the medial right lower lobe measures 2.9 x 1.7 cm on image 92/3. Index nodule in the posterolateral left lower lobe measures 4.4 x 2.2 cm on image 109/3. Small left pleural effusion also seen. Musculoskeletal: Multiple chest wall soft tissue masses are seen bilaterally, involving the breasts, chest wall muscles and subcutaneous tissues. The 2 largest masses involve the right pectoralis major muscle, measuring 3.3 x 2.9 cm on image 12/2, and the right supraspinatus muscle measuring 5.7 x 3.2 cm on image 21/2. CT ABDOMEN AND PELVIS FINDINGS Hepatobiliary: Multiple hypovascular masses are seen throughout the right and left hepatic lobes, largest in the lateral dome of the right hepatic lobe measuring 10.7 x 5.1 cm. These are consistent diffuse liver metastases. Prior cholecystectomy. No evidence of biliary obstruction. Pancreas:  No mass or inflammatory changes. Spleen:  Within normal limits in size and appearance. Adrenals/Urinary tract: A small right adrenal mass is seen measuring 2.0 x 1.5 cm, which is indeterminate but suspicious for adrenal metastasis. A large heterogeneously enhancing mass is seen involving the posterior left kidney, which shows invasion of the posterior abdominal wall soft tissues. This measures 10.3 x 8.9 cm, and is consistent with primary renal cell carcinoma. Smaller enhancing soft tissue nodules are also seen in the left posterior pararenal space, consistent with metastatic disease. Two small masses are seen along the capsular surface of the anterior mid and lower poles of the right kidney, which measure 2.2 cm and 1.5 cm in  diameter. These could represent synchronous primary renal cell carcinomas of the right kidney, or metastatic disease. Two soft tissue nodules are involve the lateral  portion of Gerota's fascia on the right, largest measuring 2.2 x 1.6 cm. A subcapsular cyst measuring 4 cm is also seen in the lateral interpolar region of the right kidney. Stomach/Bowel: No evidence of obstruction, inflammatory process, or abnormal fluid collections. Vascular/Lymphatic: Mild lymphadenopathy is seen in the left paraaortic region, largest measuring 10 mm on image 67/2. A 1.3 cm lymph node is seen in the deep left abdominal small bowel mesentery adjacent to the duodenum. No pelvic lymphadenopathy identified. Aortic atherosclerotic calcification noted. Left retroaortic renal vein is noted, however there is no evidence of renal vein or IVC thrombus. Reproductive: No masses identified. Small amount of gas noted within the endometrial cavity uterus. Other:  None. Musculoskeletal: No suspicious bone lesions identified. Multiple small masses are seen scattered throughout the abdominal wall soft tissues, consistent with metastatic disease. IMPRESSION: 10 cm left renal mass with invasion of the left posterior abdominal wall soft tissues, consistent with primary renal cell carcinoma. Adjacent metastatic soft tissue nodules also noted within the left perinephric space. Diffuse bilateral pulmonary metastases. Two small masses along the capsular surface of the right kidney, which may represent synchronous primary renal cell carcinomas or metastatic disease. Metastatic disease also involving Gerota's fascia on the right. Mild abdominal lymphadenopathy, consistent with metastatic disease. Diffuse liver metastases. Small right adrenal mass, suspicious for metastatic disease. Diffuse bilateral pulmonary metastases. 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 Electronically Signed   By: Marlaine Hind  M.D.   On: 10/13/2021 17:53    IMPRESSION:  *Symptomatic anemia: Hemoglobin on presentation was 6.2 g down from 9.0 g two years ago.  Responded to 7.4 g after 2 units of packed red blood cells.  Has Hemoccult positive with some reported dark/black stools at home. *Reports taking NSAIDs at home in the form of Aleve.  Apparently took 12 Aleve on 1/28 only. *New diagnosis of diffusely metastatic disease, suspected renal cell carcinoma with large mass on the left.  PLAN: -? Need for EGD at some point. -Continue pantoprazole for now.  Is on PPI gtt, which I am going to discontinue and just place on IV BID. -Monitor Hgb and transfuse further prn.   Laban Emperor. Juniper Cobey  10/14/2021, 9:05 AM

## 2021-10-14 NOTE — Consult Note (Signed)
Urology Consult Note   Requesting Attending Physician:  Mercy Riding, MD Service Providing Consult: Urology  Consulting Attending: Raynelle Bring, MD   Reason for Consult:  Widespread cancer, presumably renal origin  HPI: Natalie Contreras is seen in consultation for reasons noted above at the request of Mercy Riding, MD for Widespread cancer, presumably renal origin  This is a 56 y.o. female with PMH of uncontrolled DM with right  BTK amputation, CHF, Carotid stenosis, PAD, HTN presenting with weakness, weight loss, dizziness, muffled hearing. On workup found to have widespread lesions suggestive of cancer of renal origin. (Large spiculated, locally invasive mass in the left kidney). Patient is voiding spontaneously. Hemodynamically stable. States the weight loss has been going on for quite some time. She also noticed lumps in the soft tissue of her abdomen and neck which have grown in size. Denies a past diagnosis of kidney cancer.   Denies abdominal surgical history other than laparoscopic cholecystectomy in the past.     Past Medical History: Past Medical History:  Diagnosis Date   Abscess of foot 08/09/2019   WITH ULCER  RIGHT FOOT   Acute diastolic CHF (congestive heart failure) ([place]) 10/28/2016   Carotid stenosis, asymptomatic, bilateral 01/15/2017   Diabetes mellitus without complication (HCC)    Heart murmur    Hypertension    PAD (peripheral artery disease) ([place]) 04/10/2017   Left ABI 0.74. Right ABI 0.96.    Past Surgical History:  Past Surgical History:  Procedure Laterality Date   ABDOMINAL AORTOGRAM W/LOWER EXTREMITY N/A 08/10/2019   Procedure: ABDOMINAL AORTOGRAM W/LOWER EXTREMITY;  Surgeon: Waynetta Sandy, MD;  Location: [facility];  Service: Cardiovascular;  Laterality: N/A;   AMPUTATION Right 09/03/2019   Procedure: RIGHT BELOW KNEE AMPUTATION;  Surgeon: Newt Minion, MD;  Location: [facility];  Service: Orthopedics;  Laterality: Right;    AMPUTATION TOE Left 05/05/2017   Procedure: LEFT SECOND TOE AMPUTATION;  Surgeon: Aviva Signs, MD;  Location: [facility];  Service: General;  Laterality: Left;   CATARACT EXTRACTION W/PHACO Left 02/08/2016   Procedure: CATARACT EXTRACTION PHACO AND INTRAOCULAR LENS PLACEMENT ([place]);  Surgeon: Tonny Branch, MD;  Location: [facility];  Service: Ophthalmology;  Laterality: Left;  CDE 11.43    CATARACT EXTRACTION W/PHACO Right 02/26/2016   Procedure: CATARACT EXTRACTION PHACO AND INTRAOCULAR LENS PLACEMENT RIGHT; CDE:  16.90;  Surgeon: Tonny Branch, MD;  Location: [facility];  Service: Ophthalmology;  Laterality: Right;   CHOLECYSTECTOMY     FOREIGN BODY REMOVAL Left 04/09/2017   Procedure: FOREIGN BODY REMOVAL ADULT FOOT;  Surgeon: Aviva Signs, MD;  Location: [facility];  Service: General;  Laterality: Left;   I & D EXTREMITY Right 08/11/2019   Procedure: IRRIGATION AND DEBRIDEMENT EXTREMITY;  Surgeon: Newt Minion, MD;  Location: [place];  Service: Orthopedics;  Laterality: Right;   PERIPHERAL VASCULAR ATHERECTOMY Right 08/10/2019   Procedure: PERIPHERAL VASCULAR ATHERECTOMY;  Surgeon: Waynetta Sandy, MD;  Location: [facility];  Service: Cardiovascular;  Laterality: Right;  superficial femoral   PERIPHERAL VASCULAR BALLOON ANGIOPLASTY Right 08/10/2019   Procedure: PERIPHERAL VASCULAR BALLOON ANGIOPLASTY;  Surgeon: Waynetta Sandy, MD;  Location: [facility];  Service: Cardiovascular;  Laterality: Right;  anterior tibial   TRANSMETATARSAL AMPUTATION Left 07/21/2017   Procedure: TRANSMETATARSAL AMPUTATION LEFT FOOT;  Surgeon: Aviva Signs, MD;  Location: [facility];  Service: General;  Laterality: Left;    Medication: Current Facility-Administered Medications  Medication Dose Route Frequency Provider  Last Rate Last Admin   insulin aspart (novoLOG) injection 0-15 Units  0-15 Units Subcutaneous Q4H Truett Mainland, DO   2 Units at 10/14/21 1205   insulin detemir (LEVEMIR) injection 25  Units  25 Units Subcutaneous QHS Truett Mainland, DO   25 Units at 10/13/21 2234   ondansetron (ZOFRAN) tablet 4 mg  4 mg Oral Q6H PRN Truett Mainland, DO       Or   ondansetron [facility]) injection 4 mg  4 mg Intravenous Q6H PRN Truett Mainland, DO       oxyCODONE (Oxy IR/ROXICODONE) immediate release tablet 5 mg  5 mg Oral Daily PRN Truett Mainland, DO       [START ON 10/17/2021] pantoprazole (PROTONIX) injection 40 mg  40 mg Intravenous Q12H Stinson, Jacob J, DO       pantoprozole (PROTONIX) 80 mg /NS 100 mL infusion  8 mg/hr Intravenous Continuous Truett Mainland, DO 10 mL/hr at 10/14/21 0252 8 mg/hr at 10/14/21 0252   potassium chloride SA (KLOR-CON M) CR tablet 40 mEq  40 mEq Oral Once Wendee Beavers T, MD       simvastatin (ZOCOR) tablet 40 mg  40 mg Oral QHS Stinson, Jacob J, DO   40 mg at 10/13/21 2232    Allergies: No Known Allergies  Social History: Social History   Tobacco Use   Smoking status: Every Day    Packs/day: 0.50    Years: 25.00    Pack years: 12.50    Types: Cigarettes    Start date: 09/16/1984   Smokeless tobacco: Never  Vaping Use   Vaping Use: Never used  Substance Use Topics   Alcohol use: No   Drug use: No    Family History Family History  Problem Relation Age of Onset   Lung cancer Mother    Lung cancer Father    AAA (abdominal aortic aneurysm) Brother     Review of Systems 10 systems were reviewed and are negative except as noted specifically in the HPI.  Objective   Vital signs in last 24 hours: BP (!) 142/44 (BP Location: Left Arm)    Pulse 67    Temp 97.6 F (36.4 C) (Oral)    Resp 19    Ht 5\' 4"  (1.626 m)    Wt 90.1 kg    LMP 12/16/2015    SpO2 100%    BMI 34.10 kg/m   Physical Exam General: NAD, A&O, resting, appropriate HEENT: [place]/AT, EOMI, MMM Pulmonary: Normal work of breathing Cardiovascular: HDS, adequate peripheral perfusion Abdomen: Soft, nontender. Obese. Grape sized subcutaneous lesion in the anterior abomen that is not  mobile and is mildly tender to palpation.  GU: voiding spontaneously.  Extremities: warm and well perfused. Right legamputated below the knee.  Neuro: Appropriate, no focal neurological deficits  Most Recent Labs: Lab Results  Component Value Date   WBC 5.1 10/14/2021   HGB 7.4 (L) 10/14/2021   HCT 24.3 (L) 10/14/2021   PLT 338 10/14/2021    Lab Results  Component Value Date   NA 140 10/14/2021   K 3.4 (L) 10/14/2021   CL 106 10/14/2021   CO2 22 10/14/2021   BUN 67 (H) 10/14/2021   CREATININE 1.80 (H) 10/14/2021   CALCIUM 8.3 (L) 10/14/2021   MG 2.1 08/11/2019   PHOS 2.6 08/11/2019    No results found for: INR, APTT   Urine Culture: @[id number] (laburin,org,[id number])@   IMAGING: DG Chest 2 View  Result Date: 10/13/2021  CLINICAL DATA:  smoker. weakness. rule out infection/malignancy EXAM: CHEST - 2 VIEW COMPARISON:  April 07, 2017 FINDINGS: The cardiomediastinal silhouette is enlarged in contour.Atherosclerotic calcifications. No pleural effusion. No pneumothorax. Multiple nodular opacities throughout bilateral lungs. Surgical clips project over the upper abdomen. Suggestion of sclerosis of several vertebral bodies. IMPRESSION: 1. Multiple nodular opacities bilaterally. Suggestion of multifocal sclerosis of the vertebral bodies versus summation artifact. Findings are concerning for multifocal metastases. Recommend further dedicated evaluation with cross-sectional imaging. Electronically Signed   By: Valentino Saxon M.D.   On: 10/13/2021 15:29   CT Head Wo Contrast  Result Date: 10/13/2021 CLINICAL DATA:  dizziness. blurred vision. EXAM: CT HEAD WITHOUT CONTRAST TECHNIQUE: Contiguous axial images were obtained from the base of the skull through the vertex without intravenous contrast. RADIATION DOSE REDUCTION: This exam was performed according to the departmental dose-optimization program which includes automated exposure control, adjustment of the mA and/or kV according to  patient size and/or use of iterative reconstruction technique. COMPARISON:  February 17, 2017. FINDINGS: Brain: There is a lesion in the LEFT parieto-occipital lobe with adjacent vasogenic edema and a peripheral rim of intrinsic hyperdensity. It measures 2.6 x 2.4 by 2.7 cm. There is an additional intrinsically hyperdense region along the RIGHT frontoparietal falx which measures 7 by 5 mm which is concerning for an additional site of disease (series 2, image 23). Hyperdensity of the LEFT frontal lobe measuring approximately 5 mm likely reflects an additional site of disease (series 2, image 25). Hypodensity of the RIGHT occipital lobe with a small amount of intrinsic hyperdensity likely reflecting additional lesion (series 2, image 12; series 5, image 28). Hypodensity of the LEFT inferior cerebellum, consistent with the sequela of remote prior infarction and similar comparison to prior. No significant midline shift. Vascular: Vascular calcifications of the carotid siphons. Skull: No acute fracture. Sinuses/Orbits: LEFT mastoid effusion. Other: None. IMPRESSION: 1. There is a 2.7 cm peripherally hyperdense masslike area in the LEFT parieto-occipital lobe with adjacent vasogenic edema. 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 Recommend further evaluation with dedicated brain MRI with and without contrast. 2. There are likely additional lesions in the bilateral cerebral hemispheres. These results were called by telephone at the time of interpretation on 10/13/2021 at 3:20 pm to provider MARGAUX VENTER , who verbally acknowledged these results. Electronically Signed   By: Valentino Saxon M.D.   On: 10/13/2021 15:28   CT CHEST ABDOMEN PELVIS W CONTRAST  Result Date: 10/13/2021 CLINICAL DATA:  Brain metastasis of unknown primary. EXAM: CT CHEST, ABDOMEN, AND PELVIS WITH CONTRAST TECHNIQUE: Multidetector CT imaging of the chest, abdomen and pelvis was performed following the standard protocol during bolus  administration of intravenous contrast. RADIATION DOSE REDUCTION: This exam was performed according to the departmental dose-optimization program which includes automated exposure control, adjustment of the mA and/or kV according to patient size and/or use of iterative reconstruction technique. CONTRAST:  8mL OMNIPAQUE IOHEXOL 300 MG/ML  SOLN COMPARISON:  None. FINDINGS: CT CHEST FINDINGS Cardiovascular: Mild cardiomegaly. Aortic and coronary atherosclerotic calcification noted. Mediastinum/Lymph Nodes: Mild bilateral hilar lymphadenopathy is seen. No pathologically enlarged mediastinal or axillary lymph nodes are identified. Lungs/Pleura: Multiple solid bilateral pulmonary nodules are seen, consistent with diffuse pulmonary metastases. Index nodule in the medial right lower lobe measures 2.9 x 1.7 cm on image 92/3. Index nodule in the posterolateral left lower lobe measures 4.4 x 2.2 cm on image 109/3. Small left pleural effusion also seen. Musculoskeletal: Multiple chest wall soft tissue masses are seen bilaterally, involving  the breasts, chest wall muscles and subcutaneous tissues. The 2 largest masses involve the right pectoralis major muscle, measuring 3.3 x 2.9 cm on image 12/2, and the right supraspinatus muscle measuring 5.7 x 3.2 cm on image 21/2. CT ABDOMEN AND PELVIS FINDINGS Hepatobiliary: Multiple hypovascular masses are seen throughout the right and left hepatic lobes, largest in the lateral dome of the right hepatic lobe measuring 10.7 x 5.1 cm. These are consistent diffuse liver metastases. Prior cholecystectomy. No evidence of biliary obstruction. Pancreas:  No mass or inflammatory changes. Spleen:  Within normal limits in size and appearance. Adrenals/Urinary tract: A small right adrenal mass is seen measuring 2.0 x 1.5 cm, which is indeterminate but suspicious for adrenal metastasis. A large heterogeneously enhancing mass is seen involving the posterior left kidney, which shows invasion of the  posterior abdominal wall soft tissues. This measures 10.3 x 8.9 cm, and is consistent with primary renal cell carcinoma. Smaller enhancing soft tissue nodules are also seen in the left posterior pararenal space, consistent with metastatic disease. Two small masses are seen along the capsular surface of the anterior mid and lower poles of the right kidney, which measure 2.2 cm and 1.5 cm in diameter. These could represent synchronous primary renal cell carcinomas of the right kidney, or metastatic disease. Two soft tissue nodules are involve the lateral portion of Gerota's fascia on the right, largest measuring 2.2 x 1.6 cm. A subcapsular cyst measuring 4 cm is also seen in the lateral interpolar region of the right kidney. Stomach/Bowel: No evidence of obstruction, inflammatory process, or abnormal fluid collections. Vascular/Lymphatic: Mild lymphadenopathy is seen in the left paraaortic region, largest measuring 10 mm on image 67/2. A 1.3 cm lymph node is seen in the deep left abdominal small bowel mesentery adjacent to the duodenum. No pelvic lymphadenopathy identified. Aortic atherosclerotic calcification noted. Left retroaortic renal vein is noted, however there is no evidence of renal vein or IVC thrombus. Reproductive: No masses identified. Small amount of gas noted within the endometrial cavity uterus. Other:  None. Musculoskeletal: No suspicious bone lesions identified. Multiple small masses are seen scattered throughout the abdominal wall soft tissues, consistent with metastatic disease. IMPRESSION: 10 cm left renal mass with invasion of the left posterior abdominal wall soft tissues, consistent with primary renal cell carcinoma. Adjacent metastatic soft tissue nodules also noted within the left perinephric space. Diffuse bilateral pulmonary metastases. Two small masses along the capsular surface of the right kidney, which may represent synchronous primary renal cell carcinomas or metastatic disease.  Metastatic disease also involving Gerota's fascia on the right. Mild abdominal lymphadenopathy, consistent with metastatic disease. Diffuse liver metastases. Small right adrenal mass, suspicious for metastatic disease. Diffuse bilateral pulmonary metastases. 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 Electronically Signed   By: Marlaine Hind M.D.   On: 10/13/2021 17:53    ------  Assessment:  56 y.o. female with  history of hypertension, CHF, diabetes with poor control, peripheral vascular disease with left below the knee amputation. Presenting to ER with weakness, weight loss, muffled hearing, dizziness and on work-up was found to have widespread lesions concerning for metastatic disease likely of renal origin. CT findings below:  IMPRESSION: 10 cm left renal mass with invasion of the left posterior abdominal wall soft tissues, consistent with primary renal cell carcinoma. Adjacent metastatic soft tissue nodules also noted within the left perinephric space. Diffuse bilateral pulmonary metastases. Two small masses along the capsular surface of the right kidney,  which may represent synchronous primary renal cell carcinomas or metastatic disease. Metastatic disease also involving Gerota's fascia on the right. Mild abdominal lymphadenopathy, consistent with metastatic disease. Diffuse liver metastases. Small right adrenal mass, suspicious for metastatic disease. Diffuse bilateral pulmonary metastases. 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   Unfortunately, widespread metastasis with local invasion of the left renal mass along with her medical comorbidities and frailty make her a very poor surgical candidate for cytoreductive nephrectomy. She has no hydronephrosis and is voiding spontaneously. Creatinine is 1.8 which is up from 2 years ago. Unclear if new baseline.   Discussed with the  patient that her options for treatment likely include palliative systemic immunotherapy. Medical oncology team is involved in the patient's care and will consult on her. Also discussed that hospice care is an option. Patient expresses understanding of lack of surgical options.   We are available as needed for urologic issues that may arise. Thank you for involving [place] in this patient's care.    Recommendations: - No urologic intervention warranted at this time.  - Agree with medical oncology consultation, palliative care consultation. - If treatment is decided upon, patient will likely need a tissue biopsy of any of her metastatic lesions with Interventional radiologist, in order to target therapy.  - Appreciate all other multidisciplinary team members in aiding in care for this patient.    Thank you for this consult. Please contact the urology consult pager with any further questions/concerns.

## 2021-10-15 ENCOUNTER — Inpatient Hospital Stay (HOSPITAL_COMMUNITY): Payer: Medicaid Other

## 2021-10-15 ENCOUNTER — Other Ambulatory Visit: Payer: Self-pay | Admitting: Radiation Therapy

## 2021-10-15 DIAGNOSIS — K921 Melena: Secondary | ICD-10-CM | POA: Diagnosis not present

## 2021-10-15 DIAGNOSIS — N179 Acute kidney failure, unspecified: Secondary | ICD-10-CM | POA: Diagnosis not present

## 2021-10-15 DIAGNOSIS — D500 Iron deficiency anemia secondary to blood loss (chronic): Secondary | ICD-10-CM

## 2021-10-15 DIAGNOSIS — I10 Essential (primary) hypertension: Secondary | ICD-10-CM | POA: Diagnosis not present

## 2021-10-15 DIAGNOSIS — D649 Anemia, unspecified: Secondary | ICD-10-CM | POA: Diagnosis not present

## 2021-10-15 LAB — RENAL FUNCTION PANEL
Albumin: 2.5 g/dL — ABNORMAL LOW (ref 3.5–5.0)
Anion gap: 12 (ref 5–15)
BUN: 60 mg/dL — ABNORMAL HIGH (ref 6–20)
CO2: 23 mmol/L (ref 22–32)
Calcium: 8.5 mg/dL — ABNORMAL LOW (ref 8.9–10.3)
Chloride: 106 mmol/L (ref 98–111)
Creatinine, Ser: 1.75 mg/dL — ABNORMAL HIGH (ref 0.44–1.00)
GFR, Estimated: 34 mL/min — ABNORMAL LOW (ref >60)
Glucose, Bld: 90 mg/dL (ref 70–99)
Phosphorus: 3.4 mg/dL (ref 2.5–4.6)
Potassium: 4 mmol/L (ref 3.5–5.1)
Sodium: 141 mmol/L (ref 135–145)

## 2021-10-15 LAB — CBC
HCT: 23.9 % — ABNORMAL LOW (ref 36.0–46.0)
Hemoglobin: 7.1 g/dL — ABNORMAL LOW (ref 12.0–15.0)
MCH: 23.9 pg — ABNORMAL LOW (ref 26.0–34.0)
MCHC: 29.7 g/dL — ABNORMAL LOW (ref 30.0–36.0)
MCV: 80.5 fL (ref 80.0–100.0)
Platelets: 340 10*3/uL (ref 150–400)
RBC: 2.97 MIL/uL — ABNORMAL LOW (ref 3.87–5.11)
RDW: 17.8 % — ABNORMAL HIGH (ref 11.5–15.5)
WBC: 5.4 10*3/uL (ref 4.0–10.5)
nRBC: 0 % (ref 0.0–0.2)

## 2021-10-15 LAB — TYPE AND SCREEN
ABO/RH(D): O POS
Antibody Screen: NEGATIVE

## 2021-10-15 LAB — GLUCOSE, CAPILLARY
Glucose-Capillary: 96 mg/dL (ref 70–99)
Glucose-Capillary: 116 mg/dL — ABNORMAL HIGH (ref 70–99)
Glucose-Capillary: 130 mg/dL — ABNORMAL HIGH (ref 70–99)
Glucose-Capillary: 128 mg/dL — ABNORMAL HIGH (ref 70–99)
Glucose-Capillary: 97 mg/dL (ref 70–99)

## 2021-10-15 LAB — IRON AND TIBC
Iron: 16 ug/dL — ABNORMAL LOW (ref 28–170)
Saturation Ratios: 5 % — ABNORMAL LOW (ref 10.4–31.8)
TIBC: 315 ug/dL (ref 250–450)
UIBC: 299 ug/dL

## 2021-10-15 LAB — MAGNESIUM: Magnesium: 2 mg/dL (ref 1.7–2.4)

## 2021-10-15 LAB — HEMOGLOBIN AND HEMATOCRIT, BLOOD
HCT: 25 % — ABNORMAL LOW (ref 36.0–46.0)
Hemoglobin: 7.3 g/dL — ABNORMAL LOW (ref 12.0–15.0)

## 2021-10-15 LAB — FERRITIN: Ferritin: 71 ng/mL (ref 11–307)

## 2021-10-15 IMAGING — MR MR HEAD WO/W CM
16 of 17 series · 32 of 48 positions shown · IV contrast (gadavist)
Comparison: No prior MRI, correlation is made with [DATE] CT
head

CLINICAL DATA: CNS neoplasm, [facility] protocol

EXAM:
MRI HEAD WITHOUT AND WITH CONTRAST
TECHNIQUE: Multiplanar, multiecho pulse sequences of the brain and surrounding
structures were obtained without and with intravenous contrast.
CONTRAST:  9mL GADAVIST GADOBUTROL 1 MMOL/ML IV SOLN

[Series 2: FLAIR · sagittal · 3.0mm · 0.47mm/px · 1 of 42 slices shown (1 of 2)]
[im 1/42]
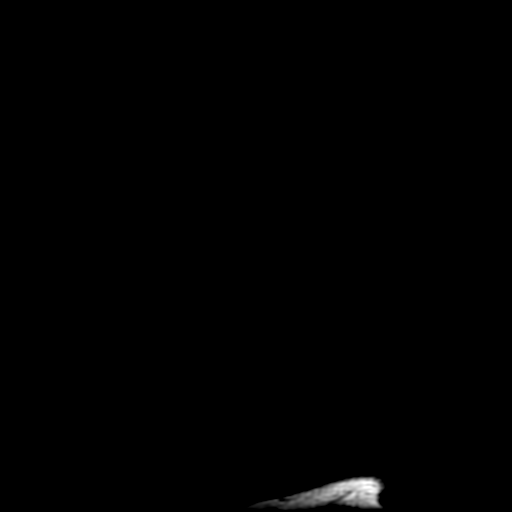

[Series 3: T2 · axial · 5.0mm · 0.43mm/px · 1 of 35 slices shown]
[im 1/35]
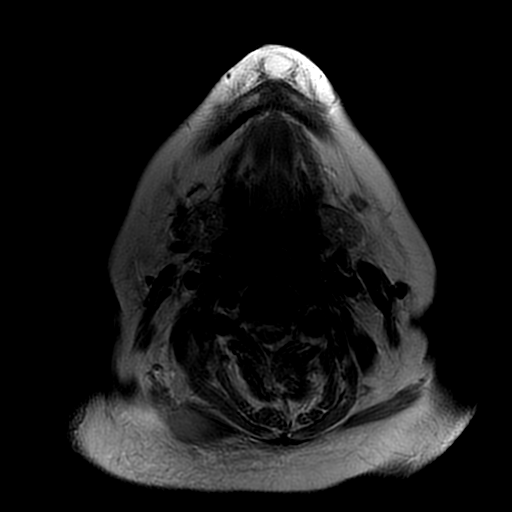

[Series 4: ax dti · axial · 3.0mm · 0.94mm/px · z∈[-96,+114]mm · 8 of 1846 slices shown]
[im 1/1846]
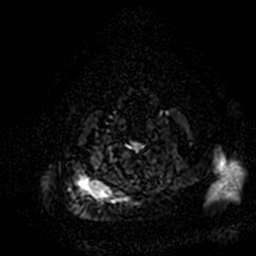
[im 264/1846]
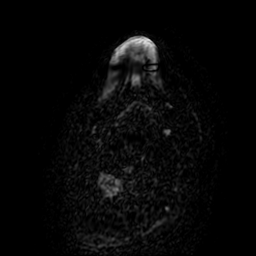
[im 528/1846]
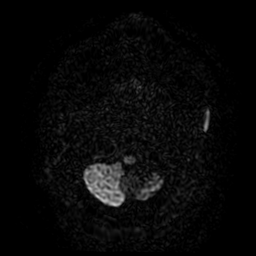
[im 791/1846]
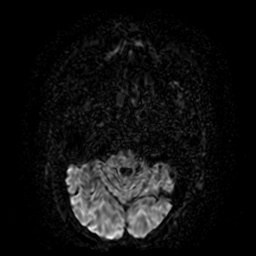
[im 1055/1846]
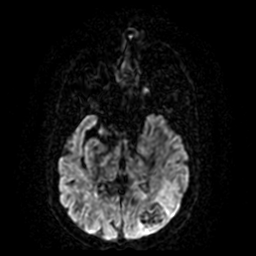
[im 1318/1846]
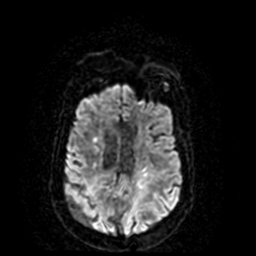
[im 1582/1846]
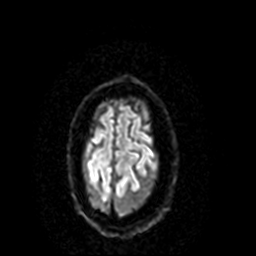
[im 1846/1846]
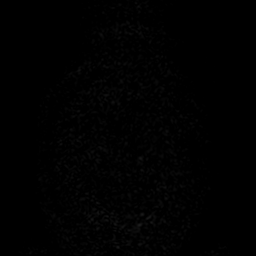

[Series 6: (person_name) · axial · 3.0mm · 0.47mm/px · 1 of 130 slices shown]
[im 1/130]
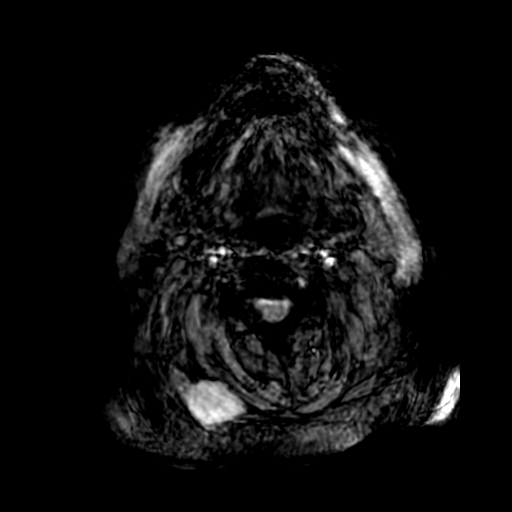

[Series 7: ax 3(person_name) · axial · 1.0mm · 1.02mm/px · 1 of 186 slices shown (1 of 2)]
[im 1/186]
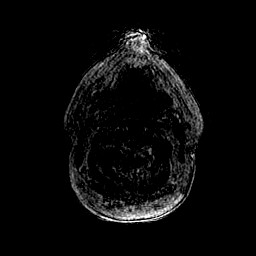

[Series 8: T2 post-contrast · coronal · 3.0mm · 0.39mm/px · 1 of 72 slices shown]
[im 1/72]
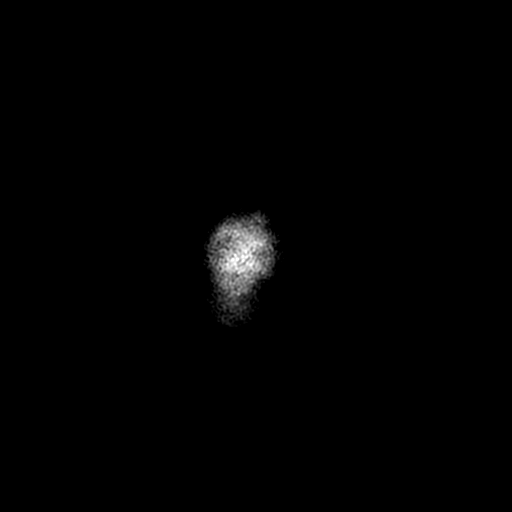

[Series 9: ax 3(person_name) · axial · 1.0mm · 1.02mm/px · 1 of 186 slices shown (2 of 2)]
[im 1/186]
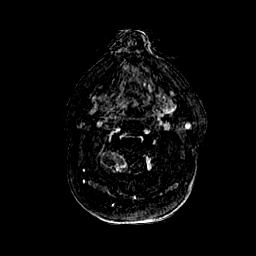

[Series 10: T1 · coronal · 3.0mm · 0.39mm/px · 1 of 72 slices shown]
[im 1/72]
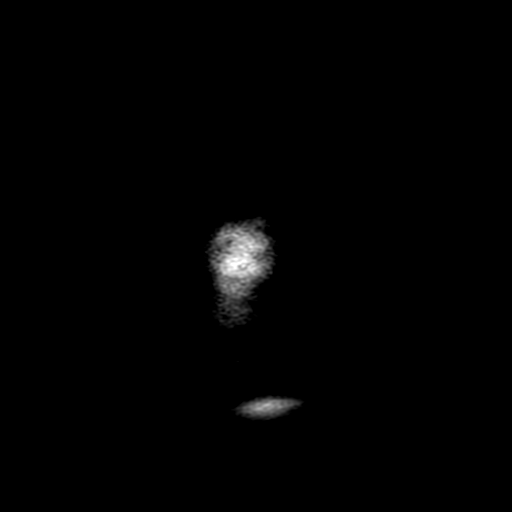

[Series 11: FLAIR · sagittal · 3.0mm · 0.47mm/px · 1 of 42 slices shown (2 of 2)]
[im 1/42]
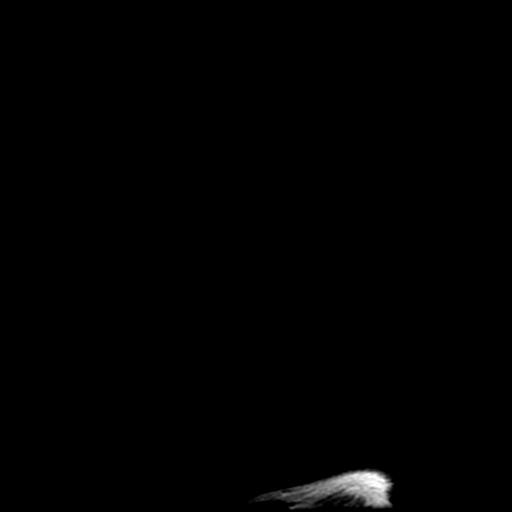

[Series 410: orig: ax dti · axial · 3.0mm · 0.94mm/px · z∈[-96,+114]mm · 8 of 1846 slices shown]
[im 1/1846]
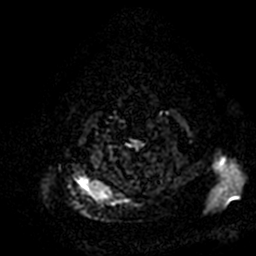
[im 264/1846]
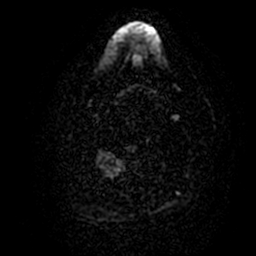
[im 528/1846]
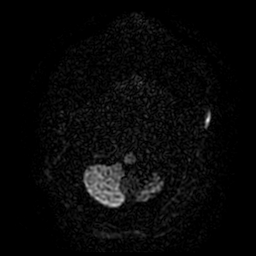
[im 791/1846]
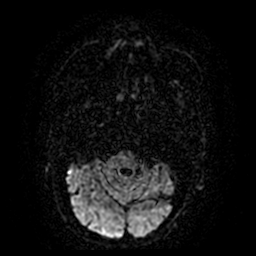
[im 1055/1846]
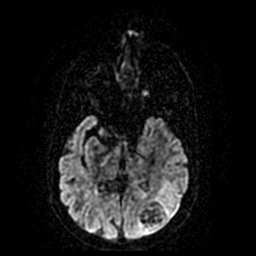
[im 1318/1846]
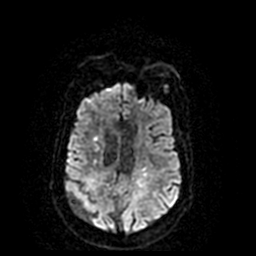
[im 1582/1846]
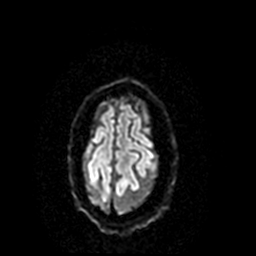
[im 1846/1846]
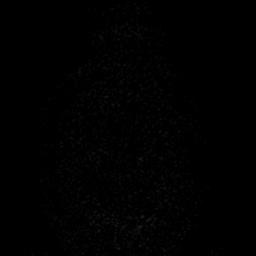

[Series 450: trace · axial · 3.0mm · 0.94mm/px · 1 of 71 slices shown]
[im 1/71]
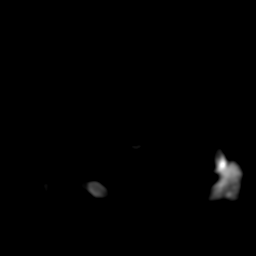

[Series 451: fa(no-q) · axial · 3.0mm · 0.94mm/px · 1 of 71 slices shown]
[im 1/71]
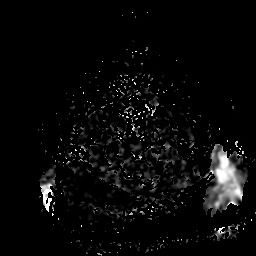

[Series 452: avdc (10^-6 mm²/s)(no-q) · axial · 3.0mm · 0.94mm/px · 1 of 71 slices shown]
[im 1/71]
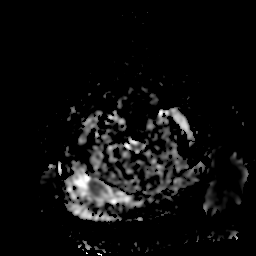

[Series 500: multiplanar reconstruction (mpr) · axial · 1.0mm · 0.50mm/px · z∈[-124,+131]mm · 2 of 256 slices shown (1 of 2)]
[im 1/256]
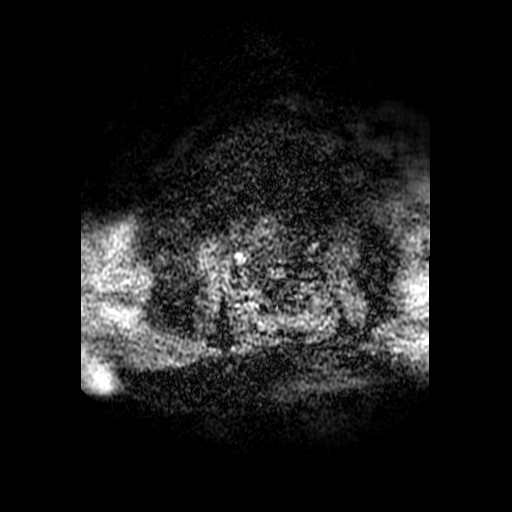
[im 256/256]
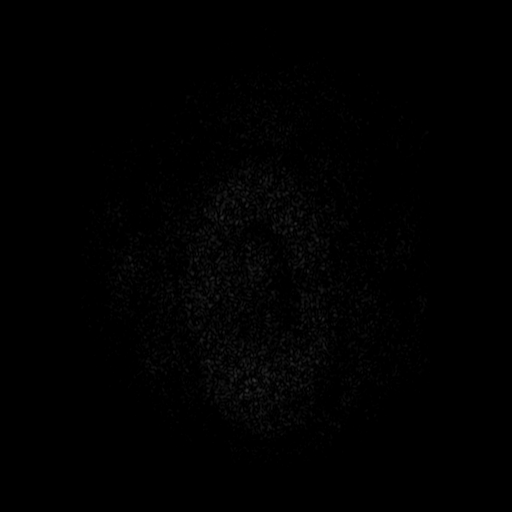

[Series 501: multiplanar reconstruction (mpr) · coronal · 1.0mm · 0.50mm/px · 2 of 244 slices shown (2 of 2)]
[im 1/244]
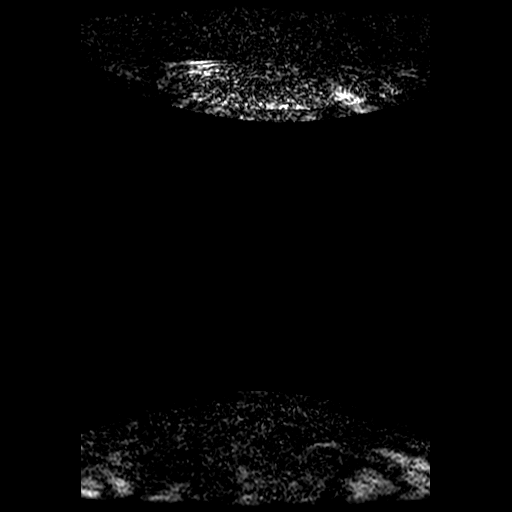
[im 244/244]
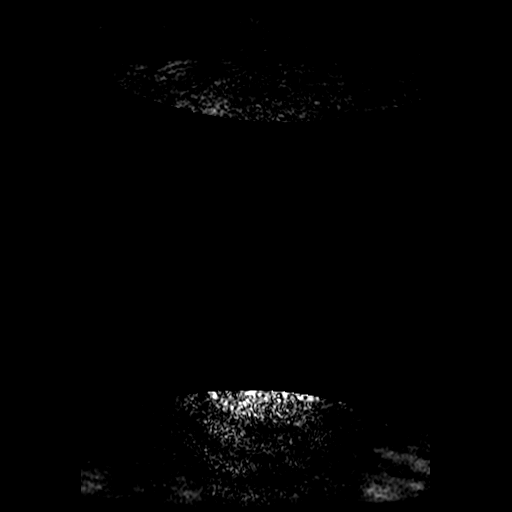

[Series 600: filt_pha: (person_name) · axial · 3.0mm · 0.47mm/px · 1 of 130 slices shown]
[im 1/130]
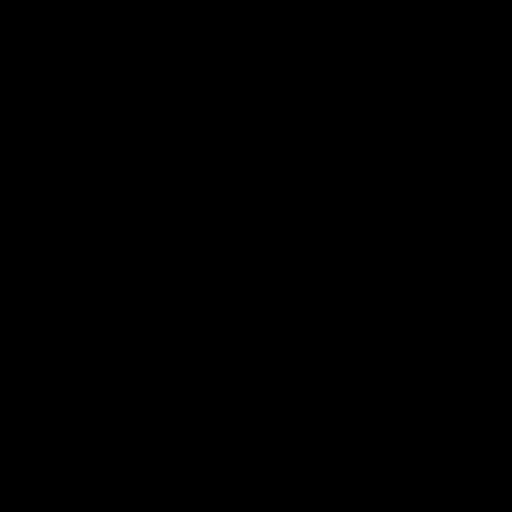

[32 of 48 positions shown; findings below may reference images not displayed]

FINDINGS: Evaluation is limited by motion artifact.

Brain: Multiple enhancing masses in the bilateral cerebral
hemispheres, the largest of which is in the left parietooccipital
lobe and measures up to 3.3 x 2.9 x 3.3 cm (AP x TR x CC) (series 9,
image 92). Additional smaller lesions include a left parietal lesion
that measures 0.7 x 0.5 x 0.7 cm (series 9, image 39 and series 10,
image 29), a left frontal lobe lesion that measures 0.6 x 0.4 x
cm (series 9, image 139 and series 10, image 43), a right frontal
lobe lesion that measures 0.3 x 0.4 x 0.4 cm (series 9, image 50 and
series 10, image 52), a right extra-axial lesion along the falx
adjacent to the right parietal lobe that measures 1.0 x 0.5 x 1.0 cm
(series 9, image 60 and series 10, image 23), right parietal lesion
that measures 1.0 x 0.9 x 1.0 cm (series 9, image 80 and series 10,
image 20), a left parietal lesion that measures 0.4 x 0.8 x 0.5 cm
(series 9, image 82 and series 10, image 22), a left occipital
lesion that measures 0.5 x 0.8 x 0.6 cm (series 9, image 124 and
series 10, image 8), and a right occipital lesion (or possibly 2
adjacent lesions) that measures 1.2 x 1.7 x 0.9 cm (series 9, image
128 and series 10, image 8). Possible additional lesions in the
right frontal lobe adjacent to the right lateral ventricle, which is
faintly visualized on the coronal postcontrast sequences (series 10,
image 32), measuring 0.6 x 0.5 cm, and in the atrium of the right
lateral ventricle (series 11, image 26), measuring approximately
cm, best seen on the sagittal sequence.

These lesions are associated with surrounding T2 hyperintense
signal, likely edema, with the largest area of edema surrounding the
dominant left parieto-occipital lesion. Several of these lesions are
also associated with hemosiderin deposition (series 6, image 54, 76,
81, and 82). Two rounded areas of hemosiderin deposition in the left
lateral ventricle atrium (series 6, image 76 and 69) are difficult
to find definite enhancing lesions to correlate with on the axial
sequences, given enhancement in the choroid plexus, but possible
correlates are suspected on the sagittal postcontrast sequence
(series 11, image 24 and 26).

No other areas of parenchymal hemorrhage. No definite midline shift.
Remote infarcts in the bilateral cerebellar hemispheres and basal
ganglia. No hydrocephalus or extra-axial collection. No
diffusion-weighted sequence was available to evaluate for infarct
given the selected protocol.

Vascular: The left PCA flow void is not well visualized, however the
vessel is seen on postcontrast imaging. Otherwise normal flow voids.

Skull and upper cervical spine: Normal marrow signal.

Sinuses/Orbits: Status post bilateral lens replacements. Otherwise
negative.

Other: Trace fluid in the mastoid air cells.
IMPRESSION: Evaluation is limited by motion artifact. Within this limitation,
multiple enhancing lesions in the bilateral cerebral hemispheres,
with additional extra-axial lesion along the right parietal lobe and
possible lesions in the atrium of the left lateral ventricle.
Several of these lesions demonstrate associated edema and
intralesional hemorrhage, including the dominant left
parieto-occipital lesion. These are overall concerning for
metastatic disease. Additional lesions may be present, but obscured
by the motion artifact.

## 2021-10-15 MED ORDER — SODIUM CHLORIDE 0.9 % IV SOLN
250.0000 mg | Freq: Every day | INTRAVENOUS | Status: AC
  Administered 2021-10-15 – 2021-10-16 (×2): 250 mg via INTRAVENOUS
  Filled 2021-10-15 (×2): qty 20

## 2021-10-15 MED ORDER — GADOBUTROL 1 MMOL/ML IV SOLN
9.0000 mL | Freq: Once | INTRAVENOUS | Status: AC | PRN
  Administered 2021-10-15: 9 mL via INTRAVENOUS

## 2021-10-15 NOTE — H&P (View-Only) (Signed)
Daily Rounding Note  10/15/2021, 1:27 PM  LOS: 2 days   SUBJECTIVE:   Chief complaint:    Symptomatic anemia. Reports frequent stools, not tarry but dark brown and soft.  Still on clears and hungry. No abdominal pain.  No nausea or vomiting.  OBJECTIVE:         Vital signs in last 24 hours:    Temp:  [97.8 F (36.6 C)-98.5 F (36.9 C)] 98 F (36.7 C) (01/30 0818) Pulse Rate:  [70-75] 75 (01/30 0818) Resp:  [17-19] 18 (01/30 0818) BP: (116-140)/(42-69) 116/60 (01/30 0818) SpO2:  [96 %-98 %] 96 % (01/30 0818) Last BM Date: 10/15/21 Filed Weights   10/13/21 1331 10/13/21 2145  Weight: 93 kg 90.1 kg   General: Looks chronically ill.  Obese.  Resting comfortably in the bed. Heart: RRR. Chest: Creased breath sounds but clear bilaterally.  No shortness of breath or cough Abdomen: Soft, not tender.  Obese but not protuberant.  Bowel sounds active Extremities: Right BKA.  Nonpitting left lower extremity edema. Neuro/Psych: Pleasant, cooperative.  Fluid speech.  Good historian.  No tremors or gross weakness.  Intake/Output from previous day: 01/29 0701 - 01/30 0700 In: 1680 [P.O.:1680] Out: 400 [Urine:400]  Intake/Output this shift: Total I/O In: 240 [P.O.:240] Out: -   Lab Results: Recent Labs    10/13/21 1428 10/14/21 0221 10/15/21 0325  WBC 5.6 5.1 5.4  HGB 6.2* 7.4* 7.1*  HCT 21.5* 24.3* 23.9*  PLT 388 338 340   BMET Recent Labs    10/13/21 1428 10/14/21 0221 10/15/21 0325  NA 138 140 141  K 3.1* 3.4* 4.0  CL 101 106 106  CO2 25 22 23   GLUCOSE 113* 104* 90  BUN 70* 67* 60*  CREATININE 1.78* 1.80* 1.75*  CALCIUM 8.8* 8.3* 8.5*   LFT Recent Labs    10/13/21 1428 10/15/21 0325  PROT 6.5  --   ALBUMIN 3.0* 2.5*  AST 11*  --   ALT 11  --   ALKPHOS 107  --   BILITOT 0.6  --    PT/INR Recent Labs    10/14/21 1255  LABPROT 13.9  INR 1.1   Hepatitis Panel No results for input(s):  HEPBSAG, HCVAB, HEPAIGM, HEPBIGM in the last 72 hours.  Studies/Results: DG Chest 2 View  Result Date: 10/13/2021 CLINICAL DATA:  smoker. weakness. rule out infection/malignancy EXAM: CHEST - 2 VIEW COMPARISON:  April 07, 2017 FINDINGS: The cardiomediastinal silhouette is enlarged in contour.Atherosclerotic calcifications. No pleural effusion. No pneumothorax. Multiple nodular opacities throughout bilateral lungs. Surgical clips project over the upper abdomen. Suggestion of sclerosis of several vertebral bodies. IMPRESSION: 1. Multiple nodular opacities bilaterally. Suggestion of multifocal sclerosis of the vertebral bodies versus summation artifact. Findings are concerning for multifocal metastases. Recommend further dedicated evaluation with cross-sectional imaging. Electronically Signed   By: Valentino Saxon M.D.   On: 10/13/2021 15:29   CT Head Wo Contrast  Result Date: 10/13/2021 CLINICAL DATA:  dizziness. blurred vision. EXAM: CT HEAD WITHOUT CONTRAST TECHNIQUE: Contiguous axial images were obtained from the base of the skull through the vertex without intravenous contrast. RADIATION DOSE REDUCTION: This exam was performed according to the departmental dose-optimization program which includes automated exposure control, adjustment of the mA and/or kV according to patient size and/or use of iterative reconstruction technique. COMPARISON:  February 17, 2017. FINDINGS: Brain: There is a lesion in the LEFT parieto-occipital lobe with adjacent vasogenic edema and a peripheral rim  of intrinsic hyperdensity. It measures 2.6 x 2.4 by 2.7 cm. There is an additional intrinsically hyperdense region along the RIGHT frontoparietal falx which measures 7 by 5 mm which is concerning for an additional site of disease (series 2, image 23). Hyperdensity of the LEFT frontal lobe measuring approximately 5 mm likely reflects an additional site of disease (series 2, image 25). Hypodensity of the RIGHT occipital lobe with a  small amount of intrinsic hyperdensity likely reflecting additional lesion (series 2, image 12; series 5, image 28). Hypodensity of the LEFT inferior cerebellum, consistent with the sequela of remote prior infarction and similar comparison to prior. No significant midline shift. Vascular: Vascular calcifications of the carotid siphons. Skull: No acute fracture. Sinuses/Orbits: LEFT mastoid effusion. Other: None. IMPRESSION: 1. There is a 2.7 cm peripherally hyperdense masslike area in the LEFT parieto-occipital lobe with adjacent vasogenic edema. 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 Recommend further evaluation with dedicated brain MRI with and without contrast. 2. There are likely additional lesions in the bilateral cerebral hemispheres. These results were called by telephone at the time of interpretation on 10/13/2021 at 3:20 pm to provider MARGAUX VENTER , who verbally acknowledged these results. Electronically Signed   By: Valentino Saxon M.D.   On: 10/13/2021 15:28   CT CHEST ABDOMEN PELVIS W CONTRAST  Result Date: 10/13/2021 CLINICAL DATA:  Brain metastasis of unknown primary. EXAM: CT CHEST, ABDOMEN, AND PELVIS WITH CONTRAST TECHNIQUE: Multidetector CT imaging of the chest, abdomen and pelvis was performed following the standard protocol during bolus administration of intravenous contrast. RADIATION DOSE REDUCTION: This exam was performed according to the departmental dose-optimization program which includes automated exposure control, adjustment of the mA and/or kV according to patient size and/or use of iterative reconstruction technique. CONTRAST:  18mL OMNIPAQUE IOHEXOL 300 MG/ML  SOLN COMPARISON:  None. FINDINGS: CT CHEST FINDINGS Cardiovascular: Mild cardiomegaly. Aortic and coronary atherosclerotic calcification noted. Mediastinum/Lymph Nodes: Mild bilateral hilar lymphadenopathy is seen. No pathologically enlarged mediastinal or axillary lymph nodes are identified. Lungs/Pleura:  Multiple solid bilateral pulmonary nodules are seen, consistent with diffuse pulmonary metastases. Index nodule in the medial right lower lobe measures 2.9 x 1.7 cm on image 92/3. Index nodule in the posterolateral left lower lobe measures 4.4 x 2.2 cm on image 109/3. Small left pleural effusion also seen. Musculoskeletal: Multiple chest wall soft tissue masses are seen bilaterally, involving the breasts, chest wall muscles and subcutaneous tissues. The 2 largest masses involve the right pectoralis major muscle, measuring 3.3 x 2.9 cm on image 12/2, and the right supraspinatus muscle measuring 5.7 x 3.2 cm on image 21/2. CT ABDOMEN AND PELVIS FINDINGS Hepatobiliary: Multiple hypovascular masses are seen throughout the right and left hepatic lobes, largest in the lateral dome of the right hepatic lobe measuring 10.7 x 5.1 cm. These are consistent diffuse liver metastases. Prior cholecystectomy. No evidence of biliary obstruction. Pancreas:  No mass or inflammatory changes. Spleen:  Within normal limits in size and appearance. Adrenals/Urinary tract: A small right adrenal mass is seen measuring 2.0 x 1.5 cm, which is indeterminate but suspicious for adrenal metastasis. A large heterogeneously enhancing mass is seen involving the posterior left kidney, which shows invasion of the posterior abdominal wall soft tissues. This measures 10.3 x 8.9 cm, and is consistent with primary renal cell carcinoma. Smaller enhancing soft tissue nodules are also seen in the left posterior pararenal space, consistent with metastatic disease. Two small masses are seen along the capsular surface of the anterior mid and lower poles  of the right kidney, which measure 2.2 cm and 1.5 cm in diameter. These could represent synchronous primary renal cell carcinomas of the right kidney, or metastatic disease. Two soft tissue nodules are involve the lateral portion of Gerota's fascia on the right, largest measuring 2.2 x 1.6 cm. A subcapsular cyst  measuring 4 cm is also seen in the lateral interpolar region of the right kidney. Stomach/Bowel: No evidence of obstruction, inflammatory process, or abnormal fluid collections. Vascular/Lymphatic: Mild lymphadenopathy is seen in the left paraaortic region, largest measuring 10 mm on image 67/2. A 1.3 cm lymph node is seen in the deep left abdominal small bowel mesentery adjacent to the duodenum. No pelvic lymphadenopathy identified. Aortic atherosclerotic calcification noted. Left retroaortic renal vein is noted, however there is no evidence of renal vein or IVC thrombus. Reproductive: No masses identified. Small amount of gas noted within the endometrial cavity uterus. Other:  None. Musculoskeletal: No suspicious bone lesions identified. Multiple small masses are seen scattered throughout the abdominal wall soft tissues, consistent with metastatic disease. IMPRESSION: 10 cm left renal mass with invasion of the left posterior abdominal wall soft tissues, consistent with primary renal cell carcinoma. Adjacent metastatic soft tissue nodules also noted within the left perinephric space. Diffuse bilateral pulmonary metastases. Two small masses along the capsular surface of the right kidney, which may represent synchronous primary renal cell carcinomas or metastatic disease. Metastatic disease also involving Gerota's fascia on the right. Mild abdominal lymphadenopathy, consistent with metastatic disease. Diffuse liver metastases. Small right adrenal mass, suspicious for metastatic disease. Diffuse bilateral pulmonary metastases. 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 Electronically Signed   By: Marlaine Hind M.D.   On: 10/13/2021 17:53    Scheduled Meds:  insulin aspart  0-15 Units Subcutaneous Q4H   insulin detemir  25 Units Subcutaneous QHS   pantoprazole (PROTONIX) IV  40 mg Intravenous Q12H   simvastatin  40 mg Oral QHS   Continuous Infusions:  ferric  gluconate (FERRLECIT) IVPB 250 mg (10/15/21 1138)   PRN Meds:.ondansetron **OR** ondansetron (ZOFRAN) IV, oxyCODONE   ASSESMENT:       IDA.  Hgb 6.2 ... 2 PRBC ... 7.1.  Anemia is longstanding.  Anemia dating back to 2018 when Hgb as low as 7.3.  FOBT +.  Protonix 40 mg IV bid in place.  No PPI PTA.  S/p ferrlecit infusion.    Left renal mass with abdominal wall invasion, involvement of Gerota's fascia.  Consistent with renal cell carcinoma.  Concurrent lesions may represent synchronous renal cell cancer or metastatic disease.  Pulmonary, chest wall, abdominal wall, brain mets.  Largest brain mets concerning for hemorrhagic metastases.  Metastatic adenopathy.  Urology sees no benefit of cytoreductive nephrectomy.  Urology awaiting results of MRI brain.  Per oncology, Dr Lorenso Courier: If no plans for neurosurgery to pursue resection for therapeutic/diagnostic purposes, suggests biopsy of liver lesion to confirm diagnosis, definitive confirmation that this is widespread RCC.  AKI.    S/p R BKA for osteomyelitis 2020   PLAN    Per Dr Christia Reading, ? Pursue EGD or colonoscopy to eval/treat for source of bleeding.      Advance diet to carb mod.      Azucena Freed  10/15/2021, 1:27 PM Phone [phone]

## 2021-10-15 NOTE — Progress Notes (Signed)
Subjective: Patient reports that she just woke up. She has no current complaints. No acute events overnight.   Objective: Vital signs in last 24 hours: Temp:  [97.6 F (36.4 C)-98.5 F (36.9 C)] 97.8 F (36.6 C) (01/30 0514) Pulse Rate:  [67-75] 75 (01/30 0514) Resp:  [17-19] 19 (01/30 0514) BP: (117-142)/(42-69) 140/69 (01/30 0514) SpO2:  [97 %-100 %] 97 % (01/30 0514)  Intake/Output from previous day: 01/29 0701 - 01/30 0700 In: 1680 [P.O.:1680] Out: 400 [Urine:400] Intake/Output this shift: No intake/output data recorded.  Physical Exam: Patient is awake, A/O X 4, conversant, and in good spirits. They are in NAD and VSS. Thought content appropriate.  Speech appropriate and fluent without evidence of aphasia. Able to follow 3 step commands without difficulty.  No dysarthria present. MAEW with good strength. RLE BKA. Sensation to light touch is intact. PERLA, EOMI. CNs grossly intact.      Lab Results: Recent Labs    10/14/21 0221 10/15/21 0325  WBC 5.1 5.4  HGB 7.4* 7.1*  HCT 24.3* 23.9*  PLT 338 340   BMET Recent Labs    10/14/21 0221 10/15/21 0325  NA 140 141  K 3.4* 4.0  CL 106 106  CO2 22 23  GLUCOSE 104* 90  BUN 67* 60*  CREATININE 1.80* 1.75*  CALCIUM 8.3* 8.5*    Studies/Results: DG Chest 2 View  Result Date: 10/13/2021 CLINICAL DATA:  smoker. weakness. rule out infection/malignancy EXAM: CHEST - 2 VIEW COMPARISON:  April 07, 2017 FINDINGS: The cardiomediastinal silhouette is enlarged in contour.Atherosclerotic calcifications. No pleural effusion. No pneumothorax. Multiple nodular opacities throughout bilateral lungs. Surgical clips project over the upper abdomen. Suggestion of sclerosis of several vertebral bodies. IMPRESSION: 1. Multiple nodular opacities bilaterally. Suggestion of multifocal sclerosis of the vertebral bodies versus summation artifact. Findings are concerning for multifocal metastases. Recommend further dedicated evaluation with  cross-sectional imaging. Electronically Signed   By: Valentino Saxon M.D.   On: 10/13/2021 15:29   CT Head Wo Contrast  Result Date: 10/13/2021 CLINICAL DATA:  dizziness. blurred vision. EXAM: CT HEAD WITHOUT CONTRAST TECHNIQUE: Contiguous axial images were obtained from the base of the skull through the vertex without intravenous contrast. RADIATION DOSE REDUCTION: This exam was performed according to the departmental dose-optimization program which includes automated exposure control, adjustment of the mA and/or kV according to patient size and/or use of iterative reconstruction technique. COMPARISON:  February 17, 2017. FINDINGS: Brain: There is a lesion in the LEFT parieto-occipital lobe with adjacent vasogenic edema and a peripheral rim of intrinsic hyperdensity. It measures 2.6 x 2.4 by 2.7 cm. There is an additional intrinsically hyperdense region along the RIGHT frontoparietal falx which measures 7 by 5 mm which is concerning for an additional site of disease (series 2, image 23). Hyperdensity of the LEFT frontal lobe measuring approximately 5 mm likely reflects an additional site of disease (series 2, image 25). Hypodensity of the RIGHT occipital lobe with a small amount of intrinsic hyperdensity likely reflecting additional lesion (series 2, image 12; series 5, image 28). Hypodensity of the LEFT inferior cerebellum, consistent with the sequela of remote prior infarction and similar comparison to prior. No significant midline shift. Vascular: Vascular calcifications of the carotid siphons. Skull: No acute fracture. Sinuses/Orbits: LEFT mastoid effusion. Other: None. IMPRESSION: 1. There is a 2.7 cm peripherally hyperdense masslike area in the LEFT parieto-occipital lobe with adjacent vasogenic edema. 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 Recommend further evaluation with dedicated brain MRI with and without contrast. 2.  There are likely additional lesions in the bilateral cerebral hemispheres.  These results were called by telephone at the time of interpretation on 10/13/2021 at 3:20 pm to provider MARGAUX VENTER , who verbally acknowledged these results. Electronically Signed   By: Valentino Saxon M.D.   On: 10/13/2021 15:28   CT CHEST ABDOMEN PELVIS W CONTRAST  Result Date: 10/13/2021 CLINICAL DATA:  Brain metastasis of unknown primary. EXAM: CT CHEST, ABDOMEN, AND PELVIS WITH CONTRAST TECHNIQUE: Multidetector CT imaging of the chest, abdomen and pelvis was performed following the standard protocol during bolus administration of intravenous contrast. RADIATION DOSE REDUCTION: This exam was performed according to the departmental dose-optimization program which includes automated exposure control, adjustment of the mA and/or kV according to patient size and/or use of iterative reconstruction technique. CONTRAST:  61mL OMNIPAQUE IOHEXOL 300 MG/ML  SOLN COMPARISON:  None. FINDINGS: CT CHEST FINDINGS Cardiovascular: Mild cardiomegaly. Aortic and coronary atherosclerotic calcification noted. Mediastinum/Lymph Nodes: Mild bilateral hilar lymphadenopathy is seen. No pathologically enlarged mediastinal or axillary lymph nodes are identified. Lungs/Pleura: Multiple solid bilateral pulmonary nodules are seen, consistent with diffuse pulmonary metastases. Index nodule in the medial right lower lobe measures 2.9 x 1.7 cm on image 92/3. Index nodule in the posterolateral left lower lobe measures 4.4 x 2.2 cm on image 109/3. Small left pleural effusion also seen. Musculoskeletal: Multiple chest wall soft tissue masses are seen bilaterally, involving the breasts, chest wall muscles and subcutaneous tissues. The 2 largest masses involve the right pectoralis major muscle, measuring 3.3 x 2.9 cm on image 12/2, and the right supraspinatus muscle measuring 5.7 x 3.2 cm on image 21/2. CT ABDOMEN AND PELVIS FINDINGS Hepatobiliary: Multiple hypovascular masses are seen throughout the right and left hepatic lobes, largest  in the lateral dome of the right hepatic lobe measuring 10.7 x 5.1 cm. These are consistent diffuse liver metastases. Prior cholecystectomy. No evidence of biliary obstruction. Pancreas:  No mass or inflammatory changes. Spleen:  Within normal limits in size and appearance. Adrenals/Urinary tract: A small right adrenal mass is seen measuring 2.0 x 1.5 cm, which is indeterminate but suspicious for adrenal metastasis. A large heterogeneously enhancing mass is seen involving the posterior left kidney, which shows invasion of the posterior abdominal wall soft tissues. This measures 10.3 x 8.9 cm, and is consistent with primary renal cell carcinoma. Smaller enhancing soft tissue nodules are also seen in the left posterior pararenal space, consistent with metastatic disease. Two small masses are seen along the capsular surface of the anterior mid and lower poles of the right kidney, which measure 2.2 cm and 1.5 cm in diameter. These could represent synchronous primary renal cell carcinomas of the right kidney, or metastatic disease. Two soft tissue nodules are involve the lateral portion of Gerota's fascia on the right, largest measuring 2.2 x 1.6 cm. A subcapsular cyst measuring 4 cm is also seen in the lateral interpolar region of the right kidney. Stomach/Bowel: No evidence of obstruction, inflammatory process, or abnormal fluid collections. Vascular/Lymphatic: Mild lymphadenopathy is seen in the left paraaortic region, largest measuring 10 mm on image 67/2. A 1.3 cm lymph node is seen in the deep left abdominal small bowel mesentery adjacent to the duodenum. No pelvic lymphadenopathy identified. Aortic atherosclerotic calcification noted. Left retroaortic renal vein is noted, however there is no evidence of renal vein or IVC thrombus. Reproductive: No masses identified. Small amount of gas noted within the endometrial cavity uterus. Other:  None. Musculoskeletal: No suspicious bone lesions identified. Multiple small  masses  are seen scattered throughout the abdominal wall soft tissues, consistent with metastatic disease. IMPRESSION: 10 cm left renal mass with invasion of the left posterior abdominal wall soft tissues, consistent with primary renal cell carcinoma. Adjacent metastatic soft tissue nodules also noted within the left perinephric space. Diffuse bilateral pulmonary metastases. Two small masses along the capsular surface of the right kidney, which may represent synchronous primary renal cell carcinomas or metastatic disease. Metastatic disease also involving Gerota's fascia on the right. Mild abdominal lymphadenopathy, consistent with metastatic disease. Diffuse liver metastases. Small right adrenal mass, suspicious for metastatic disease. Diffuse bilateral pulmonary metastases. 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 Electronically Signed   By: Marlaine Hind M.D.   On: 10/13/2021 17:53    Assessment/Plan: 56 year old female with approximately 1 month history of generalized weakness and malaise.  CT head revealed multiple masses with the largest one being a 2.7 cm and located in the left parieto-occipital lobe with an adjacent vasogenic edema that is concerning for hemorrhagic metastasis.  CT chest, abdomen, and pelvis revealed diffuse metastasis. The patient's neurological exam remains unchanged and at her baseline.    -Awaiting MRI brain w/ and w/o contrast, [facility]   LOS: 2 days     Marvis Moeller, DNP, NP-C 10/15/2021, 8:06 AM

## 2021-10-15 NOTE — Progress Notes (Signed)
Daily Rounding Note  10/15/2021, 1:27 PM  LOS: 2 days   SUBJECTIVE:   Chief complaint:    Symptomatic anemia. Reports frequent stools, not tarry but dark brown and soft.  Still on clears and hungry. No abdominal pain.  No nausea or vomiting.  OBJECTIVE:         Vital signs in last 24 hours:    Temp:  [97.8 F (36.6 C)-98.5 F (36.9 C)] 98 F (36.7 C) (01/30 0818) Pulse Rate:  [70-75] 75 (01/30 0818) Resp:  [17-19] 18 (01/30 0818) BP: (116-140)/(42-69) 116/60 (01/30 0818) SpO2:  [96 %-98 %] 96 % (01/30 0818) Last BM Date: 10/15/21 Filed Weights   10/13/21 1331 10/13/21 2145  Weight: 93 kg 90.1 kg   General: Looks chronically ill.  Obese.  Resting comfortably in the bed. Heart: RRR. Chest: Creased breath sounds but clear bilaterally.  No shortness of breath or cough Abdomen: Soft, not tender.  Obese but not protuberant.  Bowel sounds active Extremities: Right BKA.  Nonpitting left lower extremity edema. Neuro/Psych: Pleasant, cooperative.  Fluid speech.  Good historian.  No tremors or gross weakness.  Intake/Output from previous day: 01/29 0701 - 01/30 0700 In: 1680 [P.O.:1680] Out: 400 [Urine:400]  Intake/Output this shift: Total I/O In: 240 [P.O.:240] Out: -   Lab Results: Recent Labs    10/13/21 1428 10/14/21 0221 10/15/21 0325  WBC 5.6 5.1 5.4  HGB 6.2* 7.4* 7.1*  HCT 21.5* 24.3* 23.9*  PLT 388 338 340   BMET Recent Labs    10/13/21 1428 10/14/21 0221 10/15/21 0325  NA 138 140 141  K 3.1* 3.4* 4.0  CL 101 106 106  CO2 25 22 23   GLUCOSE 113* 104* 90  BUN 70* 67* 60*  CREATININE 1.78* 1.80* 1.75*  CALCIUM 8.8* 8.3* 8.5*   LFT Recent Labs    10/13/21 1428 10/15/21 0325  PROT 6.5  --   ALBUMIN 3.0* 2.5*  AST 11*  --   ALT 11  --   ALKPHOS 107  --   BILITOT 0.6  --    PT/INR Recent Labs    10/14/21 1255  LABPROT 13.9  INR 1.1   Hepatitis Panel No results for input(s):  HEPBSAG, HCVAB, HEPAIGM, HEPBIGM in the last 72 hours.  Studies/Results: DG Chest 2 View  Result Date: 10/13/2021 CLINICAL DATA:  smoker. weakness. rule out infection/malignancy EXAM: CHEST - 2 VIEW COMPARISON:  April 07, 2017 FINDINGS: The cardiomediastinal silhouette is enlarged in contour.Atherosclerotic calcifications. No pleural effusion. No pneumothorax. Multiple nodular opacities throughout bilateral lungs. Surgical clips project over the upper abdomen. Suggestion of sclerosis of several vertebral bodies. IMPRESSION: 1. Multiple nodular opacities bilaterally. Suggestion of multifocal sclerosis of the vertebral bodies versus summation artifact. Findings are concerning for multifocal metastases. Recommend further dedicated evaluation with cross-sectional imaging. Electronically Signed   By: Valentino Saxon M.D.   On: 10/13/2021 15:29   CT Head Wo Contrast  Result Date: 10/13/2021 CLINICAL DATA:  dizziness. blurred vision. EXAM: CT HEAD WITHOUT CONTRAST TECHNIQUE: Contiguous axial images were obtained from the base of the skull through the vertex without intravenous contrast. RADIATION DOSE REDUCTION: This exam was performed according to the departmental dose-optimization program which includes automated exposure control, adjustment of the mA and/or kV according to patient size and/or use of iterative reconstruction technique. COMPARISON:  February 17, 2017. FINDINGS: Brain: There is a lesion in the LEFT parieto-occipital lobe with adjacent vasogenic edema and a peripheral rim  of intrinsic hyperdensity. It measures 2.6 x 2.4 by 2.7 cm. There is an additional intrinsically hyperdense region along the RIGHT frontoparietal falx which measures 7 by 5 mm which is concerning for an additional site of disease (series 2, image 23). Hyperdensity of the LEFT frontal lobe measuring approximately 5 mm likely reflects an additional site of disease (series 2, image 25). Hypodensity of the RIGHT occipital lobe with a  small amount of intrinsic hyperdensity likely reflecting additional lesion (series 2, image 12; series 5, image 28). Hypodensity of the LEFT inferior cerebellum, consistent with the sequela of remote prior infarction and similar comparison to prior. No significant midline shift. Vascular: Vascular calcifications of the carotid siphons. Skull: No acute fracture. Sinuses/Orbits: LEFT mastoid effusion. Other: None. IMPRESSION: 1. There is a 2.7 cm peripherally hyperdense masslike area in the LEFT parieto-occipital lobe with adjacent vasogenic edema. 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 Recommend further evaluation with dedicated brain MRI with and without contrast. 2. There are likely additional lesions in the bilateral cerebral hemispheres. These results were called by telephone at the time of interpretation on 10/13/2021 at 3:20 pm to provider MARGAUX VENTER , who verbally acknowledged these results. Electronically Signed   By: Valentino Saxon M.D.   On: 10/13/2021 15:28   CT CHEST ABDOMEN PELVIS W CONTRAST  Result Date: 10/13/2021 CLINICAL DATA:  Brain metastasis of unknown primary. EXAM: CT CHEST, ABDOMEN, AND PELVIS WITH CONTRAST TECHNIQUE: Multidetector CT imaging of the chest, abdomen and pelvis was performed following the standard protocol during bolus administration of intravenous contrast. RADIATION DOSE REDUCTION: This exam was performed according to the departmental dose-optimization program which includes automated exposure control, adjustment of the mA and/or kV according to patient size and/or use of iterative reconstruction technique. CONTRAST:  85mL OMNIPAQUE IOHEXOL 300 MG/ML  SOLN COMPARISON:  None. FINDINGS: CT CHEST FINDINGS Cardiovascular: Mild cardiomegaly. Aortic and coronary atherosclerotic calcification noted. Mediastinum/Lymph Nodes: Mild bilateral hilar lymphadenopathy is seen. No pathologically enlarged mediastinal or axillary lymph nodes are identified. Lungs/Pleura:  Multiple solid bilateral pulmonary nodules are seen, consistent with diffuse pulmonary metastases. Index nodule in the medial right lower lobe measures 2.9 x 1.7 cm on image 92/3. Index nodule in the posterolateral left lower lobe measures 4.4 x 2.2 cm on image 109/3. Small left pleural effusion also seen. Musculoskeletal: Multiple chest wall soft tissue masses are seen bilaterally, involving the breasts, chest wall muscles and subcutaneous tissues. The 2 largest masses involve the right pectoralis major muscle, measuring 3.3 x 2.9 cm on image 12/2, and the right supraspinatus muscle measuring 5.7 x 3.2 cm on image 21/2. CT ABDOMEN AND PELVIS FINDINGS Hepatobiliary: Multiple hypovascular masses are seen throughout the right and left hepatic lobes, largest in the lateral dome of the right hepatic lobe measuring 10.7 x 5.1 cm. These are consistent diffuse liver metastases. Prior cholecystectomy. No evidence of biliary obstruction. Pancreas:  No mass or inflammatory changes. Spleen:  Within normal limits in size and appearance. Adrenals/Urinary tract: A small right adrenal mass is seen measuring 2.0 x 1.5 cm, which is indeterminate but suspicious for adrenal metastasis. A large heterogeneously enhancing mass is seen involving the posterior left kidney, which shows invasion of the posterior abdominal wall soft tissues. This measures 10.3 x 8.9 cm, and is consistent with primary renal cell carcinoma. Smaller enhancing soft tissue nodules are also seen in the left posterior pararenal space, consistent with metastatic disease. Two small masses are seen along the capsular surface of the anterior mid and lower poles  of the right kidney, which measure 2.2 cm and 1.5 cm in diameter. These could represent synchronous primary renal cell carcinomas of the right kidney, or metastatic disease. Two soft tissue nodules are involve the lateral portion of Gerota's fascia on the right, largest measuring 2.2 x 1.6 cm. A subcapsular cyst  measuring 4 cm is also seen in the lateral interpolar region of the right kidney. Stomach/Bowel: No evidence of obstruction, inflammatory process, or abnormal fluid collections. Vascular/Lymphatic: Mild lymphadenopathy is seen in the left paraaortic region, largest measuring 10 mm on image 67/2. A 1.3 cm lymph node is seen in the deep left abdominal small bowel mesentery adjacent to the duodenum. No pelvic lymphadenopathy identified. Aortic atherosclerotic calcification noted. Left retroaortic renal vein is noted, however there is no evidence of renal vein or IVC thrombus. Reproductive: No masses identified. Small amount of gas noted within the endometrial cavity uterus. Other:  None. Musculoskeletal: No suspicious bone lesions identified. Multiple small masses are seen scattered throughout the abdominal wall soft tissues, consistent with metastatic disease. IMPRESSION: 10 cm left renal mass with invasion of the left posterior abdominal wall soft tissues, consistent with primary renal cell carcinoma. Adjacent metastatic soft tissue nodules also noted within the left perinephric space. Diffuse bilateral pulmonary metastases. Two small masses along the capsular surface of the right kidney, which may represent synchronous primary renal cell carcinomas or metastatic disease. Metastatic disease also involving Gerota's fascia on the right. Mild abdominal lymphadenopathy, consistent with metastatic disease. Diffuse liver metastases. Small right adrenal mass, suspicious for metastatic disease. Diffuse bilateral pulmonary metastases. 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 Electronically Signed   By: Marlaine Hind M.D.   On: 10/13/2021 17:53    Scheduled Meds:  insulin aspart  0-15 Units Subcutaneous Q4H   insulin detemir  25 Units Subcutaneous QHS   pantoprazole (PROTONIX) IV  40 mg Intravenous Q12H   simvastatin  40 mg Oral QHS   Continuous Infusions:  ferric  gluconate (FERRLECIT) IVPB 250 mg (10/15/21 1138)   PRN Meds:.ondansetron **OR** ondansetron (ZOFRAN) IV, oxyCODONE   ASSESMENT:       IDA.  Hgb 6.2 ... 2 PRBC ... 7.1.  Anemia is longstanding.  Anemia dating back to 2018 when Hgb as low as 7.3.  FOBT +.  Protonix 40 mg IV bid in place.  No PPI PTA.  S/p ferrlecit infusion.    Left renal mass with abdominal wall invasion, involvement of Gerota's fascia.  Consistent with renal cell carcinoma.  Concurrent lesions may represent synchronous renal cell cancer or metastatic disease.  Pulmonary, chest wall, abdominal wall, brain mets.  Largest brain mets concerning for hemorrhagic metastases.  Metastatic adenopathy.  Urology sees no benefit of cytoreductive nephrectomy.  Urology awaiting results of MRI brain.  Per oncology, Dr Lorenso Courier: If no plans for neurosurgery to pursue resection for therapeutic/diagnostic purposes, suggests biopsy of liver lesion to confirm diagnosis, definitive confirmation that this is widespread RCC.  AKI.    S/p R BKA for osteomyelitis 2020   PLAN    Per Dr Christia Reading, ? Pursue EGD or colonoscopy to eval/treat for source of bleeding.      Advance diet to carb mod.      Azucena Freed  10/15/2021, 1:27 PM Phone [phone]

## 2021-10-15 NOTE — Plan of Care (Signed)
Problem: Education: °Goal: Knowledge of General Education information will improve °Description: Including pain rating scale, medication(s)/side effects and non-pharmacologic comfort measures °Outcome: Completed/Met °  °

## 2021-10-15 NOTE — Progress Notes (Signed)
PROGRESS NOTE  Natalie Contreras GNF:[id number] DOB: [birth year]   PCP: Celene Squibb, MD  Patient is from: Home.  Lives with his friend.  DOA: 10/13/2021 LOS: 2  Chief complaints:  Chief Complaint  Patient presents with   Weakness    Generalized      Brief Narrative / Interim history: 56 year old F with PMH of DM-2 with neuropathy, diastolic CHF, HTN, PAD, right BKA and tobacco use disorder presenting to [facility] with generalized weakness for 2 months, melena and enlarging tender nodes on head and neck, chest and back, and admitted for symptomatic anemia and 10 cm left renal mass with invasion of the left posterior abdominal wall soft tissue concerning for RCC with widespread metastasis including 2.7 cm peripheral hyperdense masslike area in the left parietal occipital lobe with vasogenic edema and concern for hemorrhagic metastasis.  Hgb 6.2.  Hemoccult positive.  Two units of RBC ordered.  GI and neurosurgery consulted, and patient was admitted to [facility] for further evaluation.  The next day, urology and oncology consulted.  Neurosurgery ordered MRI brain.  Oncology recommended tissue biopsy.   Subjective: Seen and examined earlier this morning.  No major events overnight of this morning.  She denies pain, vision change or acute focal neurodeficit.  Reports multiple dark bowel movements last night.  She denies dizziness, shortness of breath or chest pain.  Objective: Vitals:   10/14/21 1623 10/14/21 2005 10/15/21 0514 10/15/21 0818  BP: (!) 117/42 (!) 124/56 140/69 116/60  Pulse: 72 70 75 75  Resp: 17 17 19 18   Temp: 98.3 F (36.8 C) 98.5 F (36.9 C) 97.8 F (36.6 C) 98 F (36.7 C)  TempSrc:  Oral Oral Oral  SpO2: 98% 98% 97% 96%  Weight:      Height:        Examination:  GENERAL: No apparent distress.  Nontoxic. HEENT: MMM.  Vision and hearing grossly intact.  NECK: Supple.  No apparent JVD.  RESP: 96% on RA.  No IWOB.  Fair aeration bilaterally. CVS:  RRR. Heart  sounds normal.  ABD/GI/GU: BS+. Abd soft, NTND.  MSK/EXT:  Moves extremities.  Right BKA. SKIN: Immobile notch over right neck, bilateral chest, lower abdomen and lower back. NEURO: Awake and alert. Oriented appropriately.  No apparent focal neuro deficit. PSYCH: Calm. Normal affect.   Procedures:  None  Microbiology summarized: QIONG-29 and influenza PCR nonreactive.  Assessment & Plan: Symptomatic anemia: Could be due to upper GI bleed and RCC.  Hgb 9.0 in 08/2019.  Melanotic stool for 2 weeks.  Now concern for metastatic RCC.  Transfused 2 units with suboptimal response.  Anemia panel shows iron deficiency.  Continues to have melanotic stool. Recent Labs    10/13/21 1428 10/14/21 0221 10/15/21 0325  HGB 6.2* 7.4* 7.1*  -IV ferric gluconate 250 mg daily x2 doses -Appreciate input by GI -Continue PPI -Monitor H&H -Transfuse for Hgb <  7.0. -Advance to full liquid diet. -Counseled against NSAID.  Left renal mass with concern for metastatic RCC: CT abdomen and pelvis showed 10 cm left renal mass with invasion of the left posterior abdominal wall soft tissue concerning for RCC with widespread metastasis including 2.7 cm peripheral hyperdense masslike area in the left parietal occipital lobe with vasogenic edema and concern for hemorrhagic metastasis.  She had widespread nodules on her neck, chest and back. -Appreciate input by urology, oncology and neurosurgery -May need IR for tissue biopsy if no surgical plan by neurosurgery. -Follow MRI brain.  Chronic diastolic CHF: TTE in 6226 with LVEF of 55 to 60%, G2 DD and RVSP of 49 mmHg.  On torsemide at home.  Appears euvolemic on exam. -Continue holding torsemide. -Monitor fluid and respiratory status  Controlled diabetes with hyperglycemia, neuropathy and other complications: J3H 5.4%. Recent Labs  Lab 10/14/21 2037 10/14/21 2310 10/15/21 0412 10/15/21 0815 10/15/21 1127  GLUCAP 97 86 96 116* 130*  -Monitor CBG -Continue  SSI and statin  AKI/azotemia on CKD-3A: Cr 1.18 in 08/2019.  No interval value for comparison.  She admits to NSAID use. Recent Labs    10/13/21 1428 10/14/21 0221 10/15/21 0325  BUN 70* 67* 60*  CREATININE 1.78* 1.80* 1.75*  -Continue monitoring -Continue IV fluid  Essential hypertension: Normotensive off home antihypertensive meds. -Continue holding home Coreg, hydralazine and torsemide.  Hypokalemia: Resolved.   Body mass index is 34.1 kg/m.         DVT prophylaxis:  SCDs Start: 10/13/21 2154  Code Status: Full code Family Communication: Patient and/or RN. Available if any question.  Level of care: Telemetry Medical.  Change level of care to MedSurg. Status is: Inpatient  Remains inpatient appropriate because: Evaluation and treatment of symptomatic anemia in the setting of GI bleed and metastatic renal cancer       Consultants:  Neurosurgery Gastroenterology Oncology Urology   Sch Meds:  Scheduled Meds:  insulin aspart  0-15 Units Subcutaneous Q4H   insulin detemir  25 Units Subcutaneous QHS   pantoprazole (PROTONIX) IV  40 mg Intravenous Q12H   simvastatin  40 mg Oral QHS   Continuous Infusions:  ferric gluconate (FERRLECIT) IVPB 250 mg (10/15/21 1138)   PRN Meds:.ondansetron **OR** ondansetron (ZOFRAN) IV, oxyCODONE  Antimicrobials: Anti-infectives (From admission, onward)    None        I have personally reviewed the following labs and images: CBC: Recent Labs  Lab 10/13/21 1428 10/14/21 0221 10/15/21 0325  WBC 5.6 5.1 5.4  NEUTROABS 3.7  --   --   HGB 6.2* 7.4* 7.1*  HCT 21.5* 24.3* 23.9*  MCV 79.9* 79.4* 80.5  PLT 388 338 340   BMP &GFR Recent Labs  Lab 10/13/21 1428 10/14/21 0221 10/15/21 0325  NA 138 140 141  K 3.1* 3.4* 4.0  CL 101 106 106  CO2 25 22 23   GLUCOSE 113* 104* 90  BUN 70* 67* 60*  CREATININE 1.78* 1.80* 1.75*  CALCIUM 8.8* 8.3* 8.5*  MG  --   --  2.0  PHOS  --   --  3.4   Estimated Creatinine  Clearance: 40 mL/min (A) (by C-G formula based on SCr of 1.75 mg/dL (H)). Liver & Pancreas: Recent Labs  Lab 10/13/21 1428 10/15/21 0325  AST 11*  --   ALT 11  --   ALKPHOS 107  --   BILITOT 0.6  --   PROT 6.5  --   ALBUMIN 3.0* 2.5*   No results for input(s): LIPASE, AMYLASE in the last 168 hours. No results for input(s): AMMONIA in the last 168 hours. Diabetic: Recent Labs    10/13/21 2254  HGBA1C 5.4   Recent Labs  Lab 10/14/21 2037 10/14/21 2310 10/15/21 0412 10/15/21 0815 10/15/21 1127  GLUCAP 97 86 96 116* 130*   Cardiac Enzymes: No results for input(s): CKTOTAL, CKMB, CKMBINDEX, TROPONINI in the last 168 hours. No results for input(s): PROBNP in the last 8760 hours. Coagulation Profile: Recent Labs  Lab 10/14/21 1255  INR 1.1   Thyroid Function Tests: No  results for input(s): TSH, T4TOTAL, FREET4, T3FREE, THYROIDAB in the last 72 hours. Lipid Profile: No results for input(s): CHOL, HDL, LDLCALC, TRIG, CHOLHDL, LDLDIRECT in the last 72 hours. Anemia Panel: Recent Labs    10/15/21 0325  FERRITIN 71  TIBC 315  IRON 16*   Urine analysis:    Component Value Date/Time   COLORURINE YELLOW 09/12/2019 0658   APPEARANCEUR CLEAR 09/12/2019 0658   LABSPEC 1.012 09/12/2019 0658   PHURINE 6.0 09/12/2019 0658   GLUCOSEU >=500 (A) 09/12/2019 0658   HGBUR SMALL (A) 09/12/2019 0658   BILIRUBINUR NEGATIVE 09/12/2019 [place] 09/12/2019 0658   PROTEINUR 100 (A) 09/12/2019 0658   NITRITE NEGATIVE 09/12/2019 0658   LEUKOCYTESUR SMALL (A) 09/12/2019 0658   Sepsis Labs: Invalid input(s): PROCALCITONIN, [place]  Microbiology: Recent Results (from the past 240 hour(s))  Resp Panel by RT-PCR (Flu A&B, Covid) Nasopharyngeal Swab     Status: None   Collection Time: 10/13/21  2:35 PM   Specimen: Nasopharyngeal Swab; Nasopharyngeal(NP) swabs in vial transport medium  Result Value Ref Range Status   SARS Coronavirus 2 by RT PCR NEGATIVE NEGATIVE  Final    Comment: (NOTE) SARS-CoV-2 target nucleic acids are NOT DETECTED.  The SARS-CoV-2 RNA is generally detectable in upper respiratory specimens during the acute phase of infection. The lowest concentration of SARS-CoV-2 viral copies this assay can detect is 138 copies/mL. A negative result does not preclude SARS-Cov-2 infection and should not be used as the sole basis for treatment or other patient management decisions. A negative result may occur with  improper specimen collection/handling, submission of specimen other than nasopharyngeal swab, presence of viral mutation(s) within the areas targeted by this assay, and inadequate number of viral copies(<138 copies/mL). A negative result must be combined with clinical observations, patient history, and epidemiological information. The expected result is Negative.  Fact Sheet for Patients:  EntrepreneurPulse.com.au  Fact Sheet for Healthcare Providers:  IncredibleEmployment.be  This test is no t yet approved or cleared by the [place] FDA and  has been authorized for detection and/or diagnosis of SARS-CoV-2 by FDA under an Emergency Use Authorization (EUA). This EUA will remain  in effect (meaning this test can be used) for the duration of the COVID-19 declaration under Section 564(b)(1) of the Act, 21 U.S.C.section 360bbb-3(b)(1), unless the authorization is terminated  or revoked sooner.       Influenza A by PCR NEGATIVE NEGATIVE Final   Influenza B by PCR NEGATIVE NEGATIVE Final    Comment: (NOTE) The Xpert Xpress SARS-CoV-2/FLU/RSV plus assay is intended as an aid in the diagnosis of influenza from Nasopharyngeal swab specimens and should not be used as a sole basis for treatment. Nasal washings and aspirates are unacceptable for Xpert Xpress SARS-CoV-2/FLU/RSV testing.  Fact Sheet for Patients: EntrepreneurPulse.com.au  Fact Sheet for Healthcare  Providers: IncredibleEmployment.be  This test is not yet approved or cleared by the [place] FDA and has been authorized for detection and/or diagnosis of SARS-CoV-2 by FDA under an Emergency Use Authorization (EUA). This EUA will remain in effect (meaning this test can be used) for the duration of the COVID-19 declaration under Section 564(b)(1) of the Act, 21 U.S.C. section 360bbb-3(b)(1), unless the authorization is terminated or revoked.  Performed at [facility], [address], [place], [place] [address]     Radiology Studies: No results found.    Nielle Duford T. Albert Lea  If 7PM-7AM, please contact night-coverage [email or web address] 10/15/2021, 1:36 PM

## 2021-10-15 NOTE — Progress Notes (Signed)
Pt off unit to MRI.

## 2021-10-16 ENCOUNTER — Other Ambulatory Visit (HOSPITAL_COMMUNITY): Payer: Medicaid Other

## 2021-10-16 ENCOUNTER — Inpatient Hospital Stay (HOSPITAL_COMMUNITY): Payer: Medicaid Other | Admitting: Certified Registered"

## 2021-10-16 ENCOUNTER — Encounter (HOSPITAL_COMMUNITY)
Admission: EM | Disposition: A | Discharge status: Home Health | Payer: Self-pay | Source: Home / Self Care | Attending: Student

## 2021-10-16 ENCOUNTER — Other Ambulatory Visit: Payer: Self-pay | Admitting: Radiation Therapy

## 2021-10-16 ENCOUNTER — Encounter (HOSPITAL_COMMUNITY): Payer: Self-pay | Admitting: Student

## 2021-10-16 DIAGNOSIS — K254 Chronic or unspecified gastric ulcer with hemorrhage: Secondary | ICD-10-CM

## 2021-10-16 DIAGNOSIS — I10 Essential (primary) hypertension: Secondary | ICD-10-CM | POA: Diagnosis not present

## 2021-10-16 DIAGNOSIS — C7931 Secondary malignant neoplasm of brain: Secondary | ICD-10-CM

## 2021-10-16 DIAGNOSIS — K250 Acute gastric ulcer with hemorrhage: Secondary | ICD-10-CM

## 2021-10-16 DIAGNOSIS — D649 Anemia, unspecified: Secondary | ICD-10-CM | POA: Diagnosis not present

## 2021-10-16 DIAGNOSIS — D509 Iron deficiency anemia, unspecified: Secondary | ICD-10-CM

## 2021-10-16 DIAGNOSIS — K3189 Other diseases of stomach and duodenum: Secondary | ICD-10-CM | POA: Diagnosis not present

## 2021-10-16 DIAGNOSIS — N179 Acute kidney failure, unspecified: Secondary | ICD-10-CM | POA: Diagnosis not present

## 2021-10-16 DIAGNOSIS — K921 Melena: Secondary | ICD-10-CM | POA: Diagnosis not present

## 2021-10-16 HISTORY — PX: BIOPSY: SHX5522

## 2021-10-16 HISTORY — PX: ESOPHAGOGASTRODUODENOSCOPY (EGD) WITH PROPOFOL: SHX5813

## 2021-10-16 LAB — GLUCOSE, CAPILLARY
Glucose-Capillary: 79 mg/dL (ref 70–99)
Glucose-Capillary: 67 mg/dL — ABNORMAL LOW (ref 70–99)
Glucose-Capillary: 119 mg/dL — ABNORMAL HIGH (ref 70–99)
Glucose-Capillary: 71 mg/dL (ref 70–99)
Glucose-Capillary: 112 mg/dL — ABNORMAL HIGH (ref 70–99)
Glucose-Capillary: 96 mg/dL (ref 70–99)
Glucose-Capillary: 130 mg/dL — ABNORMAL HIGH (ref 70–99)
Glucose-Capillary: 126 mg/dL — ABNORMAL HIGH (ref 70–99)

## 2021-10-16 LAB — COMPREHENSIVE METABOLIC PANEL
ALT: 11 U/L (ref 0–44)
AST: 12 U/L — ABNORMAL LOW (ref 15–41)
Albumin: 2.5 g/dL — ABNORMAL LOW (ref 3.5–5.0)
Alkaline Phosphatase: 99 U/L (ref 38–126)
Anion gap: 11 (ref 5–15)
BUN: 55 mg/dL — ABNORMAL HIGH (ref 6–20)
CO2: 22 mmol/L (ref 22–32)
Calcium: 8.6 mg/dL — ABNORMAL LOW (ref 8.9–10.3)
Chloride: 107 mmol/L (ref 98–111)
Creatinine, Ser: 1.74 mg/dL — ABNORMAL HIGH (ref 0.44–1.00)
GFR, Estimated: 34 mL/min — ABNORMAL LOW (ref >60)
Glucose, Bld: 69 mg/dL — ABNORMAL LOW (ref 70–99)
Potassium: 3.8 mmol/L (ref 3.5–5.1)
Sodium: 140 mmol/L (ref 135–145)
Total Bilirubin: 0.5 mg/dL (ref 0.3–1.2)
Total Protein: 5.7 g/dL — ABNORMAL LOW (ref 6.5–8.1)

## 2021-10-16 LAB — CBC
HCT: 24.4 % — ABNORMAL LOW (ref 36.0–46.0)
Hemoglobin: 7.4 g/dL — ABNORMAL LOW (ref 12.0–15.0)
MCH: 24.6 pg — ABNORMAL LOW (ref 26.0–34.0)
MCHC: 30.3 g/dL (ref 30.0–36.0)
MCV: 81.1 fL (ref 80.0–100.0)
Platelets: 324 10*3/uL (ref 150–400)
RBC: 3.01 MIL/uL — ABNORMAL LOW (ref 3.87–5.11)
RDW: 18.1 % — ABNORMAL HIGH (ref 11.5–15.5)
WBC: 5.3 10*3/uL (ref 4.0–10.5)
nRBC: 0 % (ref 0.0–0.2)

## 2021-10-16 LAB — PHOSPHORUS: Phosphorus: 2.8 mg/dL (ref 2.5–4.6)

## 2021-10-16 LAB — MAGNESIUM: Magnesium: 2 mg/dL (ref 1.7–2.4)

## 2021-10-16 LAB — ERYTHROPOIETIN: Erythropoietin: 62.9 m[IU]/mL — ABNORMAL HIGH (ref 2.6–18.5)

## 2021-10-16 SURGERY — ESOPHAGOGASTRODUODENOSCOPY (EGD) WITH PROPOFOL
Anesthesia: Monitor Anesthesia Care

## 2021-10-16 MED ORDER — SUCRALFATE 1 GM/10ML PO SUSP
1.0000 g | Freq: Three times a day (TID) | ORAL | Status: DC
  Administered 2021-10-16 – 2021-10-18 (×7): 1 g via ORAL
  Filled 2021-10-16 (×7): qty 10

## 2021-10-16 MED ORDER — DEXTROSE 50 % IV SOLN
INTRAVENOUS | Status: AC
  Filled 2021-10-16: qty 50

## 2021-10-16 MED ORDER — PANTOPRAZOLE SODIUM 40 MG PO TBEC
40.0000 mg | DELAYED_RELEASE_TABLET | Freq: Two times a day (BID) | ORAL | Status: DC
  Administered 2021-10-16 – 2021-10-18 (×4): 40 mg via ORAL
  Filled 2021-10-16 (×4): qty 1

## 2021-10-16 MED ORDER — DEXTROSE 50 % IV SOLN: 25.0000 mL | Freq: Once | INTRAVENOUS | Status: AC

## 2021-10-16 MED ORDER — PROPOFOL 10 MG/ML IV BOLUS
INTRAVENOUS | Status: DC | PRN
  Administered 2021-10-16: 60 mg via INTRAVENOUS

## 2021-10-16 MED ORDER — LACTATED RINGERS IV SOLN
INTRAVENOUS | Status: AC | PRN
  Administered 2021-10-16: 20 mL/h via INTRAVENOUS

## 2021-10-16 MED ORDER — PROPOFOL 500 MG/50ML IV EMUL
INTRAVENOUS | Status: DC | PRN
  Administered 2021-10-16: 125 ug/kg/min via INTRAVENOUS

## 2021-10-16 SURGICAL SUPPLY — 15 items

## 2021-10-16 NOTE — Progress Notes (Signed)
Radiation Oncology         [phone] ________________________________  Initial Inpatient Consultation by telephone.  The patient opted for telemedicine to maximize safety during the pandemic.  MyChart video was not obtainable.   Name: Natalie Contreras MRN: [id number]  Date: 10/17/2021  DOB: [birth year]  DD:UKGU, Edwinna Areola, MD  Celene Squibb, MD   REFERRING PHYSICIAN: Celene Squibb, MD  DIAGNOSIS:    ICD-10-CM   1. Brain metastases ([place])  C79.31      Left renal mass with invasion of the left posterior abdominal wall concerning for RCC with widespread metastases, including brain metastases  STAGE IV  CHIEF COMPLAINT: Here to discuss management of possible RCC and multiple metastases  HISTORY OF PRESENT ILLNESS::Natalie Contreras is a 56 y.o. female who presented to the ED on 10/13/21 with c/o generalized weakness x 2 months, melena, and lymphadenopathy on head, neck, chest and back. She was accordingly admitted with anemia and received 2 units PRBCs.   CT of the chest abdomen and pelvis performed on 10/13/21 demonstrated a 10 cm renal mass with invasion of the left posterior abdominal wall, concerning for primary renal cell carcinoma. Adjacent metastatic soft tissue nodules were also noted within the left perinephric space, as well as bilateral pulmonary metastases, two small masses along the capsular surface of the right kidney (noted to possibly represent synchronous primary renal cell carcinomas or metastatic disease), metastatic disease involving Gerota's fascia on the right, mild abdominal lymphadenopathy, diffuse liver metastases, small suspicious right adrenal mass, and diffuse chest and abdominal wall soft tissue metastases.  CT of the head on 10/13/21 also showed a 2.7 cm peripherally hyperdense masslike area in the left parieto-occipital lobe, with adjacent vasogenic edema. Findings were noted as concerning for hemorrhagic metastasis. Findings also indicated the likelihood of  additional lesions in the bilateral cerebral hemispheres.  Biopsy appts pending, pathology is pending at this time.   MRI of the brain on 10/15/21 further revealed multiple enhancing lesions in the bilateral cerebral hemispheres, with additional extra-axial lesion along the right parietal lobe, and possible lesions in the atrium of the left lateral ventricle. Several of these lesions were noted to demonstrate associated edema and intralesional hemorrhage, including the dominant left parieto-occipital lesion. Findings were again noted as overall concerning for metastatic disease. (Additional lesions were again noted as likely present, but obscured by the motion artifact).  I have personally reviewed her brain and systemic imaging.  The patient remains inpatient at his time.   PREVIOUS RADIATION THERAPY: No  PAST MEDICAL HISTORY:  has a past medical history of Abscess of foot (54/27/0623), Acute diastolic CHF (congestive heart failure) ([place]) (10/28/2016), Carotid stenosis, asymptomatic, bilateral (01/15/2017), Diabetes mellitus without complication ([place]), Heart murmur, Hypertension, and PAD (peripheral artery disease) ([place]) (04/10/2017).    PAST SURGICAL HISTORY: Past Surgical History:  Procedure Laterality Date   ABDOMINAL AORTOGRAM W/LOWER EXTREMITY N/A 08/10/2019   Procedure: ABDOMINAL AORTOGRAM W/LOWER EXTREMITY;  Surgeon: Waynetta Sandy, MD;  Location: [facility];  Service: Cardiovascular;  Laterality: N/A;   AMPUTATION Right 09/03/2019   Procedure: RIGHT BELOW KNEE AMPUTATION;  Surgeon: Newt Minion, MD;  Location: [facility];  Service: Orthopedics;  Laterality: Right;   AMPUTATION TOE Left 05/05/2017   Procedure: LEFT SECOND TOE AMPUTATION;  Surgeon: Aviva Signs, MD;  Location: [facility];  Service: General;  Laterality: Left;   BIOPSY  10/16/2021   Procedure: BIOPSY;  Surgeon: Sharyn Creamer, MD;  Location: [facility];  Service:  Gastroenterology;;   CATARACT EXTRACTION  W/PHACO Left 02/08/2016   Procedure: CATARACT EXTRACTION PHACO AND INTRAOCULAR LENS PLACEMENT ([place]);  Surgeon: Tonny Branch, MD;  Location: [facility];  Service: Ophthalmology;  Laterality: Left;  CDE 11.43    CATARACT EXTRACTION W/PHACO Right 02/26/2016   Procedure: CATARACT EXTRACTION PHACO AND INTRAOCULAR LENS PLACEMENT RIGHT; CDE:  16.90;  Surgeon: Tonny Branch, MD;  Location: [facility];  Service: Ophthalmology;  Laterality: Right;   CHOLECYSTECTOMY     ESOPHAGOGASTRODUODENOSCOPY (EGD) WITH PROPOFOL N/A 10/16/2021   Procedure: ESOPHAGOGASTRODUODENOSCOPY (EGD) WITH PROPOFOL;  Surgeon: Sharyn Creamer, MD;  Location: [facility];  Service: Gastroenterology;  Laterality: N/A;   FOREIGN BODY REMOVAL Left 04/09/2017   Procedure: FOREIGN BODY REMOVAL ADULT FOOT;  Surgeon: Aviva Signs, MD;  Location: [facility];  Service: General;  Laterality: Left;   I & D EXTREMITY Right 08/11/2019   Procedure: IRRIGATION AND DEBRIDEMENT EXTREMITY;  Surgeon: Newt Minion, MD;  Location: [facility];  Service: Orthopedics;  Laterality: Right;   PERIPHERAL VASCULAR ATHERECTOMY Right 08/10/2019   Procedure: PERIPHERAL VASCULAR ATHERECTOMY;  Surgeon: Waynetta Sandy, MD;  Location: [facility];  Service: Cardiovascular;  Laterality: Right;  superficial femoral   PERIPHERAL VASCULAR BALLOON ANGIOPLASTY Right 08/10/2019   Procedure: PERIPHERAL VASCULAR BALLOON ANGIOPLASTY;  Surgeon: Waynetta Sandy, MD;  Location: [facility];  Service: Cardiovascular;  Laterality: Right;  anterior tibial   TRANSMETATARSAL AMPUTATION Left 07/21/2017   Procedure: TRANSMETATARSAL AMPUTATION LEFT FOOT;  Surgeon: Aviva Signs, MD;  Location: [facility];  Service: General;  Laterality: Left;    FAMILY HISTORY: family history includes AAA (abdominal aortic aneurysm) in her brother; Lung cancer in her father and mother.  SOCIAL HISTORY:  reports that she has been smoking cigarettes. She started smoking about 37 years ago. She has a  12.50 pack-year smoking history. She has never used smokeless tobacco. She reports that she does not drink alcohol and does not use drugs.  ALLERGIES: Patient has no known allergies.  MEDICATIONS:  No current facility-administered medications for this encounter.   No current outpatient medications on file.   Facility-Administered Medications Ordered in Other Encounters  Medication Dose Route Frequency Provider Last Rate Last Admin   dextrose 50 % solution 12.5 g  12.5 g Intravenous STAT Nolberto Hanlon, MD       insulin aspart (novoLOG) injection 0-15 Units  0-15 Units Subcutaneous Q4H Truett Mainland, DO   2 Units at 10/16/21 2009   insulin detemir (LEVEMIR) injection 25 Units  25 Units Subcutaneous QHS Truett Mainland, DO   25 Units at 10/16/21 2149   ondansetron (ZOFRAN) tablet 4 mg  4 mg Oral Q6H PRN Truett Mainland, DO       Or   ondansetron [facility]) injection 4 mg  4 mg Intravenous Q6H PRN Truett Mainland, DO       oxyCODONE (Oxy IR/ROXICODONE) immediate release tablet 5 mg  5 mg Oral Daily PRN Truett Mainland, DO       pantoprazole (PROTONIX) EC tablet 40 mg  40 mg Oral BID Pham, Minh Q, RPH-CPP   40 mg at 10/16/21 2148   simvastatin (ZOCOR) tablet 40 mg  40 mg Oral QHS Truett Mainland, DO   40 mg at 10/16/21 2148   sucralfate (CARAFATE) 1 GM/10ML suspension 1 g  1 g Oral TID WC & HS Mercy Riding, MD   1 g at 10/16/21 2148    REVIEW OF SYSTEMS:  Notable  for that above.   PHYSICAL EXAM:  vitals were not taken for this visit.   General: Alert and oriented, in no acute distress    LABORATORY DATA:  Lab Results  Component Value Date   WBC 5.1 10/17/2021   HGB 7.7 (L) 10/17/2021   HCT 26.6 (L) 10/17/2021   MCV 81.3 10/17/2021   PLT 247 10/17/2021   CMP     Component Value Date/Time   NA 139 10/17/2021 0334   K 3.9 10/17/2021 0334   CL 106 10/17/2021 0334   CO2 20 (L) 10/17/2021 0334   GLUCOSE 77 10/17/2021 0334   BUN 49 (H) 10/17/2021 0334   CREATININE 1.67 (H)  10/17/2021 0334   CREATININE 0.83 03/04/2017 1637   CALCIUM 8.6 (L) 10/17/2021 0334   PROT 5.7 (L) 10/16/2021 0324   ALBUMIN 2.7 (L) 10/17/2021 0334   AST 12 (L) 10/16/2021 0324   ALT 11 10/16/2021 0324   ALKPHOS 99 10/16/2021 0324   BILITOT 0.5 10/16/2021 0324   GFRNONAA 36 (L) 10/17/2021 0334   GFRAA >60 09/13/2019 0835         RADIOGRAPHY: DG Chest 2 View  Result Date: 10/13/2021 CLINICAL DATA:  smoker. weakness. rule out infection/malignancy EXAM: CHEST - 2 VIEW COMPARISON:  April 07, 2017 FINDINGS: The cardiomediastinal silhouette is enlarged in contour.Atherosclerotic calcifications. No pleural effusion. No pneumothorax. Multiple nodular opacities throughout bilateral lungs. Surgical clips project over the upper abdomen. Suggestion of sclerosis of several vertebral bodies. IMPRESSION: 1. Multiple nodular opacities bilaterally. Suggestion of multifocal sclerosis of the vertebral bodies versus summation artifact. Findings are concerning for multifocal metastases. Recommend further dedicated evaluation with cross-sectional imaging. Electronically Signed   By: Valentino Saxon M.D.   On: 10/13/2021 15:29   CT Head Wo Contrast  Result Date: 10/13/2021 CLINICAL DATA:  dizziness. blurred vision. EXAM: CT HEAD WITHOUT CONTRAST TECHNIQUE: Contiguous axial images were obtained from the base of the skull through the vertex without intravenous contrast. RADIATION DOSE REDUCTION: This exam was performed according to the departmental dose-optimization program which includes automated exposure control, adjustment of the mA and/or kV according to patient size and/or use of iterative reconstruction technique. COMPARISON:  February 17, 2017. FINDINGS: Brain: There is a lesion in the LEFT parieto-occipital lobe with adjacent vasogenic edema and a peripheral rim of intrinsic hyperdensity. It measures 2.6 x 2.4 by 2.7 cm. There is an additional intrinsically hyperdense region along the RIGHT frontoparietal falx  which measures 7 by 5 mm which is concerning for an additional site of disease (series 2, image 23). Hyperdensity of the LEFT frontal lobe measuring approximately 5 mm likely reflects an additional site of disease (series 2, image 25). Hypodensity of the RIGHT occipital lobe with a small amount of intrinsic hyperdensity likely reflecting additional lesion (series 2, image 12; series 5, image 28). Hypodensity of the LEFT inferior cerebellum, consistent with the sequela of remote prior infarction and similar comparison to prior. No significant midline shift. Vascular: Vascular calcifications of the carotid siphons. Skull: No acute fracture. Sinuses/Orbits: LEFT mastoid effusion. Other: None. IMPRESSION: 1. There is a 2.7 cm peripherally hyperdense masslike area in the LEFT parieto-occipital lobe with adjacent vasogenic edema. 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 Recommend further evaluation with dedicated brain MRI with and without contrast. 2. There are likely additional lesions in the bilateral cerebral hemispheres. These results were called by telephone at the time of interpretation on 10/13/2021 at 3:20 pm to provider MARGAUX VENTER , who verbally acknowledged these results. Electronically Signed  By: Valentino Saxon M.D.   On: 10/13/2021 15:28   MR BRAIN W WO CONTRAST  Result Date: 10/16/2021 CLINICAL DATA:  CNS neoplasm, [facility] protocol EXAM: MRI HEAD WITHOUT AND WITH CONTRAST TECHNIQUE: Multiplanar, multiecho pulse sequences of the brain and surrounding structures were obtained without and with intravenous contrast. CONTRAST:  62mL GADAVIST GADOBUTROL 1 MMOL/ML IV SOLN COMPARISON:  No prior MRI, correlation is made with 10/13/2021 CT head FINDINGS: Evaluation is limited by motion artifact. Brain: Multiple enhancing masses in the bilateral cerebral hemispheres, the largest of which is in the left parietooccipital lobe and measures up to 3.3 x 2.9 x 3.3 cm (AP x TR x CC) (series 9, image 92).  Additional smaller lesions include a left parietal lesion that measures 0.7 x 0.5 x 0.7 cm (series 9, image 39 and series 10, image 29), a left frontal lobe lesion that measures 0.6 x 0.4 x 0.6 cm (series 9, image 139 and series 10, image 43), a right frontal lobe lesion that measures 0.3 x 0.4 x 0.4 cm (series 9, image 50 and series 10, image 52), a right extra-axial lesion along the falx adjacent to the right parietal lobe that measures 1.0 x 0.5 x 1.0 cm (series 9, image 60 and series 10, image 23), right parietal lesion that measures 1.0 x 0.9 x 1.0 cm (series 9, image 80 and series 10, image 20), a left parietal lesion that measures 0.4 x 0.8 x 0.5 cm (series 9, image 82 and series 10, image 22), a left occipital lesion that measures 0.5 x 0.8 x 0.6 cm (series 9, image 124 and series 10, image 8), and a right occipital lesion (or possibly 2 adjacent lesions) that measures 1.2 x 1.7 x 0.9 cm (series 9, image 128 and series 10, image 8). Possible additional lesions in the right frontal lobe adjacent to the right lateral ventricle, which is faintly visualized on the coronal postcontrast sequences (series 10, image 32), measuring 0.6 x 0.5 cm, and in the atrium of the right lateral ventricle (series 11, image 26), measuring approximately 0.4 cm, best seen on the sagittal sequence. These lesions are associated with surrounding T2 hyperintense signal, likely edema, with the largest area of edema surrounding the dominant left parieto-occipital lesion. Several of these lesions are also associated with hemosiderin deposition (series 6, image 54, 76, 81, and 82). Two rounded areas of hemosiderin deposition in the left lateral ventricle atrium (series 6, image 76 and 69) are difficult to find definite enhancing lesions to correlate with on the axial sequences, given enhancement in the choroid plexus, but possible correlates are suspected on the sagittal postcontrast sequence (series 11, image 24 and 26). No other areas of  parenchymal hemorrhage. No definite midline shift. 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 No hydrocephalus or extra-axial collection. No diffusion-weighted sequence was available to evaluate for infarct given the selected protocol. Vascular: The left PCA flow void is not well visualized, however the vessel is seen on postcontrast imaging. Otherwise normal flow voids. Skull and upper cervical spine: Normal marrow signal. Sinuses/Orbits: Status post bilateral lens replacements. Otherwise negative. Other: Trace fluid in the mastoid air cells. IMPRESSION: Evaluation is limited by motion artifact. Within this limitation, multiple enhancing lesions in the bilateral cerebral hemispheres, with additional extra-axial lesion along the right parietal lobe and possible lesions in the atrium of the left lateral ventricle. Several of these lesions demonstrate associated edema and intralesional hemorrhage, including the dominant left parieto-occipital lesion. These are overall  concerning for metastatic disease. Additional lesions may be present, but obscured by the motion artifact. Electronically Signed   By: Merilyn Baba M.D.   On: 10/16/2021 02:05   CT CHEST ABDOMEN PELVIS W CONTRAST  Result Date: 10/13/2021 CLINICAL DATA:  Brain metastasis of unknown primary. EXAM: CT CHEST, ABDOMEN, AND PELVIS WITH CONTRAST TECHNIQUE: Multidetector CT imaging of the chest, abdomen and pelvis was performed following the standard protocol during bolus administration of intravenous contrast. RADIATION DOSE REDUCTION: This exam was performed according to the departmental dose-optimization program which includes automated exposure control, adjustment of the mA and/or kV according to patient size and/or use of iterative reconstruction technique. CONTRAST:  63mL OMNIPAQUE IOHEXOL 300 MG/ML  SOLN COMPARISON:  None. FINDINGS: CT CHEST FINDINGS Cardiovascular: Mild cardiomegaly. Aortic and coronary  atherosclerotic calcification noted. Mediastinum/Lymph Nodes: Mild bilateral hilar lymphadenopathy is seen. No pathologically enlarged mediastinal or axillary lymph nodes are identified. Lungs/Pleura: Multiple solid bilateral pulmonary nodules are seen, consistent with diffuse pulmonary metastases. Index nodule in the medial right lower lobe measures 2.9 x 1.7 cm on image 92/3. Index nodule in the posterolateral left lower lobe measures 4.4 x 2.2 cm on image 109/3. Small left pleural effusion also seen. Musculoskeletal: Multiple chest wall soft tissue masses are seen bilaterally, involving the breasts, chest wall muscles and subcutaneous tissues. The 2 largest masses involve the right pectoralis major muscle, measuring 3.3 x 2.9 cm on image 12/2, and the right supraspinatus muscle measuring 5.7 x 3.2 cm on image 21/2. CT ABDOMEN AND PELVIS FINDINGS Hepatobiliary: Multiple hypovascular masses are seen throughout the right and left hepatic lobes, largest in the lateral dome of the right hepatic lobe measuring 10.7 x 5.1 cm. These are consistent diffuse liver metastases. Prior cholecystectomy. No evidence of biliary obstruction. Pancreas:  No mass or inflammatory changes. Spleen:  Within normal limits in size and appearance. Adrenals/Urinary tract: A small right adrenal mass is seen measuring 2.0 x 1.5 cm, which is indeterminate but suspicious for adrenal metastasis. A large heterogeneously enhancing mass is seen involving the posterior left kidney, which shows invasion of the posterior abdominal wall soft tissues. This measures 10.3 x 8.9 cm, and is consistent with primary renal cell carcinoma. Smaller enhancing soft tissue nodules are also seen in the left posterior pararenal space, consistent with metastatic disease. Two small masses are seen along the capsular surface of the anterior mid and lower poles of the right kidney, which measure 2.2 cm and 1.5 cm in diameter. These could represent synchronous primary renal  cell carcinomas of the right kidney, or metastatic disease. Two soft tissue nodules are involve the lateral portion of Gerota's fascia on the right, largest measuring 2.2 x 1.6 cm. A subcapsular cyst measuring 4 cm is also seen in the lateral interpolar region of the right kidney. Stomach/Bowel: No evidence of obstruction, inflammatory process, or abnormal fluid collections. Vascular/Lymphatic: Mild lymphadenopathy is seen in the left paraaortic region, largest measuring 10 mm on image 67/2. A 1.3 cm lymph node is seen in the deep left abdominal small bowel mesentery adjacent to the duodenum. No pelvic lymphadenopathy identified. Aortic atherosclerotic calcification noted. Left retroaortic renal vein is noted, however there is no evidence of renal vein or IVC thrombus. Reproductive: No masses identified. Small amount of gas noted within the endometrial cavity uterus. Other:  None. Musculoskeletal: No suspicious bone lesions identified. Multiple small masses are seen scattered throughout the abdominal wall soft tissues, consistent with metastatic disease. IMPRESSION: 10 cm left renal mass with invasion of  the left posterior abdominal wall soft tissues, consistent with primary renal cell carcinoma. Adjacent metastatic soft tissue nodules also noted within the left perinephric space. Diffuse bilateral pulmonary metastases. Two small masses along the capsular surface of the right kidney, which may represent synchronous primary renal cell carcinomas or metastatic disease. Metastatic disease also involving Gerota's fascia on the right. Mild abdominal lymphadenopathy, consistent with metastatic disease. Diffuse liver metastases. Small right adrenal mass, suspicious for metastatic disease. Diffuse bilateral pulmonary metastases. 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 Electronically Signed   By: Marlaine Hind M.D.   On: 10/13/2021 17:53      IMPRESSION/PLAN: I had a  brief consultation with Natalie Contreras before she was taken downstairs for what I believe is a biopsy to determine the source of her metastatic cancer.  Today, I talked to the patient about the findings and work-up thus far.  We discussed the patient's diagnosis of what is most likely metastatic cancer involving the brain, working diagnosis of renal cell carcinoma, and general treatment for this, highlighting the role of radiotherapy in the management of the brain specifically.  We discussed the available radiation techniques, and focused on the details of logistics and delivery.     I explained she will need an outpatient 3T MRI to determine whether she is a candidate for targeted stereotactic radiosurgery or whole brain radiation.  She did express anxiety during that last MRI and is interested in anxiolytics during the next scan.  I contacted our navigator Mont Dutton with request for the following  1) arrange 3T MRI (without motion artifact, hopefully) to occur ideally between Fri-Sun so that we can review at tumor board on Monday and perform SRS vs whole brain planning on Tuesday - I will regroup with patient in person at that time to discuss plan of care and review consent form.  2) arrange for her to receive Valium 5 mg PO 60 minutes before the MRI (Dr. Cyndia Skeeters  may been the best physician to order this as I can't make inpatient med orders).   The patient is pleased with the above plan and I look forward to participating in her care.  This encounter was provided by telemedicine platform; patient desired telemedicine during pandemic precautions.  Mychart video was not available and therefore phone was used. The patient has given verbal consent for this type of encounter and has been advised to only accept a meeting of this type in a secure network environment. On date of service, in total, I spent 35 minutes on this encounter.   The attendants for this meeting include Eppie Gibson  and [name] During the encounter, Eppie Gibson was located at [facility] Radiation Oncology Department.  ELIZBETH POSA was located at her hospital room, [facility].   __________________________________________   Eppie Gibson, MD  This document serves as a record of services personally performed by Eppie Gibson, MD. It was created on her behalf by Roney Mans, a trained medical scribe. The creation of this record is based on the scribe's personal observations and the provider's statements to them. This document has been checked and approved by the attending provider.

## 2021-10-16 NOTE — Plan of Care (Signed)
  Problem: Health Behavior/Discharge Planning: Goal: Ability to manage health-related needs will improve Outcome: Progressing   Problem: Clinical Measurements: Goal: Will remain free from infection Outcome: Progressing   

## 2021-10-16 NOTE — Progress Notes (Signed)
PHARMACIST - PHYSICIAN COMMUNICATION  DR:   Cyndia Skeeters  CONCERNING: IV to Oral Route Change Policy  RECOMMENDATION: This patient is receiving Protonix by the intravenous route.  Based on criteria approved by the Pharmacy and Therapeutics Committee, the intravenous medication(s) is/are being converted to the equivalent oral dose form(s).   DESCRIPTION: These criteria include: The patient is eating (either orally or via tube) and/or has been taking other orally administered medications for a least 24 hours The patient has no evidence of active gastrointestinal bleeding or impaired GI absorption (gastrectomy, short bowel, patient on TNA or NPO).  If you have questions about this conversion, please contact the Pharmacy Department  []   [phone] )  [facility] []   [phone] )  [facility] [x]   [phone] )  [facility] []   [phone] )  [facility] []   [phone] )  [facility]

## 2021-10-16 NOTE — Transfer of Care (Signed)
Immediate Anesthesia Transfer of Care Note  Patient: Natalie Contreras  Procedure(s) Performed: ESOPHAGOGASTRODUODENOSCOPY (EGD) WITH PROPOFOL BIOPSY  Patient Location: PACU  Anesthesia Type:MAC  Level of Consciousness: drowsy and patient cooperative  Airway & Oxygen Therapy: Patient Spontanous Breathing  Post-op Assessment: Report given to RN and Post -op Vital signs reviewed and stable  Post vital signs: Reviewed and stable  Last Vitals:  Vitals Value Taken Time  BP    Temp    Pulse 70 10/16/21 1134  Resp 21 10/16/21 1134  SpO2 96 % 10/16/21 1134  Vitals shown include unvalidated device data.  Last Pain:  Vitals:   10/16/21 1051  TempSrc:   PainSc: 0-No pain         Complications: No notable events documented.

## 2021-10-16 NOTE — Progress Notes (Signed)
Neurosurgery  MRI reviewed.  Although her largest CNS lesion may be best controlled with combination of resection + SRS, she has significant burden of systemic, at least 8 CNS mets, and significant medical comorbidities.  As such, treating her CNS disease with radiation alone would likely give her the most palliative benefit.  I discussed with Dr. Isidore Moos of radiation oncology-- she will likely need an outpatient 3T MRI to determine whether she is a candidate for [facility] or WBRT with hippocampal sparing.

## 2021-10-16 NOTE — Progress Notes (Addendum)
Inpatient Diabetes Program Recommendations  AACE/ADA: New Consensus Statement on Inpatient Glycemic Control (2015)  Target Ranges:  Prepandial:   less than 140 mg/dL      Peak postprandial:   less than 180 mg/dL (1-2 hours)      Critically ill patients:  140 - 180 mg/dL   Lab Results  Component Value Date   GLUCAP 96 10/16/2021   HGBA1C 5.4 10/13/2021    Review of Glycemic Control  Latest Reference Range & Units 10/16/21 07:29 10/16/21 08:23 10/16/21 10:47 10/16/21 11:33 10/16/21 12:14  Glucose-Capillary 70 - 99 mg/dL 67 (L) 119 (H) 71 112 (H) 96  (L): Data is abnormally low (H): Data is abnormally high  Diabetes history: DM1  Outpatient Diabetes medications:  Omnipod insulin pump & Dexcom CGM Basal--0.9 units/hour (total daily basal 21.6 units) Insulin carb ratio--1 units: 10 carbs Correction-- 1 units drops blood sugar 40 mg/dL Target 140-150  Current orders for Inpatient glycemic control: Levimir 25 units QD, Novolog 0-15 units Q4H  Inpatient Diabetes Program Recommendations:    Levemir 20 units QD Novolog 0-9 units TID and 0-5 QHS  Spoke with patient at bedside.  Reviewed Omnipod settings and listed above.  She wears a Dexcom as well.  She Has her alarms set for hypoglycemia.  She is very happy with her pump and Dexcom.  Current with Dr. Delphina Cahill.  Last visit was on 09/18/21.  Denies difficulties obtaining pump supplies and insulin.    Will continue to follow while inpatient.  Thank you, Reche Dixon, MSN, RN Diabetes Coordinator Inpatient Diabetes Program [phone] (team pager from 8a-5p)

## 2021-10-16 NOTE — Progress Notes (Signed)
PROGRESS NOTE  Natalie Contreras QMG:[id number] DOB: 1966/06/22   PCP: Celene Squibb, MD  Patient is from: Home.  Lives with his friend.  DOA: 10/13/2021 LOS: 3  Chief complaints:  Chief Complaint  Patient presents with   Weakness    Generalized      Brief Narrative / Interim history: 56 year old F with PMH of DM-2 with neuropathy, diastolic CHF, HTN, PAD, right BKA and tobacco use disorder presenting to [facility] with generalized weakness for 2 months, melena and enlarging tender nodes on head and neck, chest and back, and admitted for symptomatic anemia and 10 cm left renal mass with invasion of the left posterior abdominal wall soft tissue concerning for RCC with widespread metastasis including 2.7 cm peripheral hyperdense masslike area in the left parietal occipital lobe with vasogenic edema and concern for hemorrhagic metastasis.  Hgb 6.2.  Hemoccult positive.  Transfused 2 units.  Hgb improved to 7.4 and remained stable.  GI, neurosurgery, urology and oncology consulted.  No plan for urologic intervention.  Neurosurgery ordered MRI brain that showed multiple enhancing lesions with edema and intralesional hemorrhage concerning for metastatic disease.  Patient has no focal neurologic symptoms or headache.  Patient underwent EGD that showed oozing fungating cratered gastric ulcers, concerning for malignancy that has been biopsied.  GI recommended discussion with radiation oncology versus IR if patient continues to have GI bleeding issues, PPI twice daily indefinitely and Carafate 1 g p.o. 4 times daily for 2 weeks.  IR consulted for tissue biopsy to confirm suspected RCC.  Subjective: Seen and examined earlier this morning.  No major events overnight of this morning.  No complaints.  She has not a bowel movement since yesterday morning.  She denies pain, weakness, dizziness, GI or UTI symptoms.  Objective: Vitals:   10/16/21 0857 10/16/21 1051 10/16/21 1135 10/16/21 1150  BP: (!) 138/47  (!) 169/39 (!) 120/47 (!) 152/51  Pulse: 67 74 70 73  Resp: 19 19 19 20   Temp: 98.6 F (37 C) (!) 97.4 F (36.3 C) 98.1 F (36.7 C) 98.1 F (36.7 C)  TempSrc: Oral     SpO2: 100% 96% 95% 98%  Weight:      Height:        Examination:  GENERAL: No apparent distress.  Nontoxic. HEENT: MMM.  Vision and hearing grossly intact.  NECK: Supple.  No apparent JVD.  RESP: 98% on RA.  No IWOB.  Fair aeration bilaterally. CVS:  RRR. Heart sounds normal.  ABD/GI/GU: BS+. Abd soft, NTND.  MSK/EXT:  Moves extremities.  Right BKA. SKIN: Immobile nodes over right neck, bilateral chest, lower abdomen and lower back NEURO: Awake and alert. Oriented appropriately.  No apparent focal neuro deficit. PSYCH: Calm. Normal affect.   Procedures:  1/31-EGD that showed oozing fungating cratered gastric ulcers, concerning for malignancy (biopsied).   Microbiology summarized: COVID-19 and influenza PCR nonreactive.  Assessment & Plan: Symptomatic anemia due to upper GI bleed from fungating gastric ulcer: Hgb 9.0 in 08/2019.  Melanotic stool for 2 weeks.  EGD as above.  Concern metastatic RCC.  Transfused 2 units with suboptimal response but H&H stable now.  Anemia panel shows iron deficiency.  Recent Labs    10/13/21 1428 10/14/21 0221 10/15/21 0325 10/15/21 1433 10/16/21 0324  HGB 6.2* 7.4* 7.1* 7.3* 7.4*  -IV ferric gluconate 250 mg daily x2 doses -Appreciate input by GI  -Radiation oncology versus IR if patient continues to bleed - PPI twice daily indefinitely and Carafate 1 g  p.o. 4 times daily for 2 weeks. -Monitor H&H -Transfuse for Hgb <  7.0. -Counseled against NSAID.  Left renal mass with concern for metastatic RCC: CT abdomen and pelvis showed 10 cm left renal mass with invasion of the left posterior abdominal wall soft tissue concerning for RCC with widespread metastasis including 2.7 cm peripheral hyperdense masslike area in the left parietal occipital lobe with vasogenic edema and  concern for hemorrhagic metastasis.  She had widespread nodules on her neck, chest and back.  MRI brain with multiple enhancing lesion with edema and intralesional hemorrhage concerning for metastatic disease.  Patient is basically asymptomatic. -No plan for urologic intervention -Doubt surgical intervention by neurosurgery -IR consulted for tissue biopsy -Oncology following.  Chronic diastolic CHF: TTE in 3267 with LVEF of 55 to 60%, G2 DD and RVSP of 49 mmHg.  On torsemide at home.  Appears euvolemic on exam. -Continue holding torsemide. -Monitor fluid and respiratory status  Controlled diabetes with hyperglycemia, neuropathy and other complications: T2W 5.8%. Recent Labs  Lab 10/16/21 0337 10/16/21 0729 10/16/21 0823 10/16/21 1047 10/16/21 1133  GLUCAP 79 67* 119* 71 112*  -Monitor CBG -Continue SSI and statin  CKD-3B/azotemia: Cr 1.18 in 08/2019.  Initially thought to have AKI but likely progressive CKD.  Azotemia likely from GI bleed. Recent Labs    10/13/21 1428 10/14/21 0221 10/15/21 0325 10/16/21 0324  BUN 70* 67* 60* 55*  CREATININE 1.78* 1.80* 1.75* 1.74*  -Discontinue IV fluid  Essential hypertension: BP slightly elevated. -Resume home Coreg -Continue holding hydralazine and torsemide.  Hypokalemia: Resolved.  Class I obesity Body mass index is 34.1 kg/m.         DVT prophylaxis:  SCDs Start: 10/13/21 2154  Code Status: Full code Family Communication: Patient and/or RN. Available if any question.  Level of care: Telemetry Medical.  Change level of care to MedSurg. Status is: Inpatient  Remains inpatient appropriate because: Evaluation and treatment of symptomatic anemia in the setting of GI bleed and metastatic renal cancer   Final disposition: Likely home after tissue biopsy if H&H stable    Consultants:  Neurosurgery Gastroenterology-signed off Oncology Urology-signed off Interventional radiology   Sch Meds:  Scheduled Meds:  [MAR  Hold] insulin aspart  0-15 Units Subcutaneous Q4H   [MAR Hold] insulin detemir  25 Units Subcutaneous QHS   [MAR Hold] pantoprazole (PROTONIX) IV  40 mg Intravenous Q12H   [MAR Hold] simvastatin  40 mg Oral QHS   Continuous Infusions:  ferric gluconate (FERRLECIT) IVPB 250 mg (10/15/21 1138)   PRN Meds:.[MAR Hold] ondansetron **OR** [MAR Hold] ondansetron (ZOFRAN) IV, [MAR Hold] oxyCODONE  Antimicrobials: Anti-infectives (From admission, onward)    None        I have personally reviewed the following labs and images: CBC: Recent Labs  Lab 10/13/21 1428 10/14/21 0221 10/15/21 0325 10/15/21 1433 10/16/21 0324  WBC 5.6 5.1 5.4  --  5.3  NEUTROABS 3.7  --   --   --   --   HGB 6.2* 7.4* 7.1* 7.3* 7.4*  HCT 21.5* 24.3* 23.9* 25.0* 24.4*  MCV 79.9* 79.4* 80.5  --  81.1  PLT 388 338 340  --  324   BMP &GFR Recent Labs  Lab 10/13/21 1428 10/14/21 0221 10/15/21 0325 10/16/21 0324  NA 138 140 141 140  K 3.1* 3.4* 4.0 3.8  CL 101 106 106 107  CO2 25 22 23 22   GLUCOSE 113* 104* 90 69*  BUN 70* 67* 60* 55*  CREATININE  1.78* 1.80* 1.75* 1.74*  CALCIUM 8.8* 8.3* 8.5* 8.6*  MG  --   --  2.0 2.0  PHOS  --   --  3.4 2.8   Estimated Creatinine Clearance: 40.2 mL/min (A) (by C-G formula based on SCr of 1.74 mg/dL (H)). Liver & Pancreas: Recent Labs  Lab 10/13/21 1428 10/15/21 0325 10/16/21 0324  AST 11*  --  12*  ALT 11  --  11  ALKPHOS 107  --  99  BILITOT 0.6  --  0.5  PROT 6.5  --  5.7*  ALBUMIN 3.0* 2.5* 2.5*   No results for input(s): LIPASE, AMYLASE in the last 168 hours. No results for input(s): AMMONIA in the last 168 hours. Diabetic: Recent Labs    10/13/21 2254  HGBA1C 5.4   Recent Labs  Lab 10/16/21 0337 10/16/21 0729 10/16/21 0823 10/16/21 1047 10/16/21 1133  GLUCAP 79 67* 119* 71 112*   Cardiac Enzymes: No results for input(s): CKTOTAL, CKMB, CKMBINDEX, TROPONINI in the last 168 hours. No results for input(s): PROBNP in the last 8760  hours. Coagulation Profile: Recent Labs  Lab 10/14/21 1255  INR 1.1   Thyroid Function Tests: No results for input(s): TSH, T4TOTAL, FREET4, T3FREE, THYROIDAB in the last 72 hours. Lipid Profile: No results for input(s): CHOL, HDL, LDLCALC, TRIG, CHOLHDL, LDLDIRECT in the last 72 hours. Anemia Panel: Recent Labs    10/15/21 0325  FERRITIN 71  TIBC 315  IRON 16*   Urine analysis:    Component Value Date/Time   COLORURINE YELLOW 09/12/2019 0658   APPEARANCEUR CLEAR 09/12/2019 0658   LABSPEC 1.012 09/12/2019 0658   PHURINE 6.0 09/12/2019 0658   GLUCOSEU >=500 (A) 09/12/2019 0658   HGBUR SMALL (A) 09/12/2019 0658   BILIRUBINUR NEGATIVE 09/12/2019 [place] 09/12/2019 0658   PROTEINUR 100 (A) 09/12/2019 0658   NITRITE NEGATIVE 09/12/2019 0658   LEUKOCYTESUR SMALL (A) 09/12/2019 0658   Sepsis Labs: Invalid input(s): PROCALCITONIN, [place]  Microbiology: Recent Results (from the past 240 hour(s))  Resp Panel by RT-PCR (Flu A&B, Covid) Nasopharyngeal Swab     Status: None   Collection Time: 10/13/21  2:35 PM   Specimen: Nasopharyngeal Swab; Nasopharyngeal(NP) swabs in vial transport medium  Result Value Ref Range Status   SARS Coronavirus 2 by RT PCR NEGATIVE NEGATIVE Final    Comment: (NOTE) SARS-CoV-2 target nucleic acids are NOT DETECTED.  The SARS-CoV-2 RNA is generally detectable in upper respiratory specimens during the acute phase of infection. The lowest concentration of SARS-CoV-2 viral copies this assay can detect is 138 copies/mL. A negative result does not preclude SARS-Cov-2 infection and should not be used as the sole basis for treatment or other patient management decisions. A negative result may occur with  improper specimen collection/handling, submission of specimen other than nasopharyngeal swab, presence of viral mutation(s) within the areas targeted by this assay, and inadequate number of viral copies(<138 copies/mL). A negative  result must be combined with clinical observations, patient history, and epidemiological information. The expected result is Negative.  Fact Sheet for Patients:  EntrepreneurPulse.com.au  Fact Sheet for Healthcare Providers:  IncredibleEmployment.be  This test is no t yet approved or cleared by the [place] FDA and  has been authorized for detection and/or diagnosis of SARS-CoV-2 by FDA under an Emergency Use Authorization (EUA). This EUA will remain  in effect (meaning this test can be used) for the duration of the COVID-19 declaration under Section 564(b)(1) of the Act, 21 U.S.C.section 360bbb-3(b)(1),  unless the authorization is terminated  or revoked sooner.       Influenza A by PCR NEGATIVE NEGATIVE Final   Influenza B by PCR NEGATIVE NEGATIVE Final    Comment: (NOTE) The Xpert Xpress SARS-CoV-2/FLU/RSV plus assay is intended as an aid in the diagnosis of influenza from Nasopharyngeal swab specimens and should not be used as a sole basis for treatment. Nasal washings and aspirates are unacceptable for Xpert Xpress SARS-CoV-2/FLU/RSV testing.  Fact Sheet for Patients: EntrepreneurPulse.com.au  Fact Sheet for Healthcare Providers: IncredibleEmployment.be  This test is not yet approved or cleared by the [place] FDA and has been authorized for detection and/or diagnosis of SARS-CoV-2 by FDA under an Emergency Use Authorization (EUA). This EUA will remain in effect (meaning this test can be used) for the duration of the COVID-19 declaration under Section 564(b)(1) of the Act, 21 U.S.C. section 360bbb-3(b)(1), unless the authorization is terminated or revoked.  Performed at [facility], [address], [place], [place] [address]     Radiology Studies: MR BRAIN W WO CONTRAST  Result Date: 10/16/2021 CLINICAL DATA:  CNS neoplasm, BrainLAB protocol EXAM: MRI HEAD WITHOUT AND WITH CONTRAST  TECHNIQUE: Multiplanar, multiecho pulse sequences of the brain and surrounding structures were obtained without and with intravenous contrast. CONTRAST:  2mL GADAVIST GADOBUTROL 1 MMOL/ML IV SOLN COMPARISON:  No prior MRI, correlation is made with 10/13/2021 CT head FINDINGS: Evaluation is limited by motion artifact. Brain: Multiple enhancing masses in the bilateral cerebral hemispheres, the largest of which is in the left parietooccipital lobe and measures up to 3.3 x 2.9 x 3.3 cm (AP x TR x CC) (series 9, image 92). Additional smaller lesions include a left parietal lesion that measures 0.7 x 0.5 x 0.7 cm (series 9, image 39 and series 10, image 29), a left frontal lobe lesion that measures 0.6 x 0.4 x 0.6 cm (series 9, image 139 and series 10, image 43), a right frontal lobe lesion that measures 0.3 x 0.4 x 0.4 cm (series 9, image 50 and series 10, image 52), a right extra-axial lesion along the falx adjacent to the right parietal lobe that measures 1.0 x 0.5 x 1.0 cm (series 9, image 60 and series 10, image 23), right parietal lesion that measures 1.0 x 0.9 x 1.0 cm (series 9, image 80 and series 10, image 20), a left parietal lesion that measures 0.4 x 0.8 x 0.5 cm (series 9, image 82 and series 10, image 22), a left occipital lesion that measures 0.5 x 0.8 x 0.6 cm (series 9, image 124 and series 10, image 8), and a right occipital lesion (or possibly 2 adjacent lesions) that measures 1.2 x 1.7 x 0.9 cm (series 9, image 128 and series 10, image 8). Possible additional lesions in the right frontal lobe adjacent to the right lateral ventricle, which is faintly visualized on the coronal postcontrast sequences (series 10, image 32), measuring 0.6 x 0.5 cm, and in the atrium of the right lateral ventricle (series 11, image 26), measuring approximately 0.4 cm, best seen on the sagittal sequence. These lesions are associated with surrounding T2 hyperintense signal, likely edema, with the largest area of edema  surrounding the dominant left parieto-occipital lesion. Several of these lesions are also associated with hemosiderin deposition (series 6, image 54, 76, 81, and 82). Two rounded areas of hemosiderin deposition in the left lateral ventricle atrium (series 6, image 76 and 69) are difficult to find definite enhancing lesions to correlate with on the  axial sequences, given enhancement in the choroid plexus, but possible correlates are suspected on the sagittal postcontrast sequence (series 11, image 24 and 26). No other areas of parenchymal hemorrhage. No definite midline shift. 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 No hydrocephalus or extra-axial collection. No diffusion-weighted sequence was available to evaluate for infarct given the selected protocol. Vascular: The left PCA flow void is not well visualized, however the vessel is seen on postcontrast imaging. Otherwise normal flow voids. Skull and upper cervical spine: Normal marrow signal. Sinuses/Orbits: Status post bilateral lens replacements. Otherwise negative. Other: Trace fluid in the mastoid air cells. IMPRESSION: Evaluation is limited by motion artifact. Within this limitation, multiple enhancing lesions in the bilateral cerebral hemispheres, with additional extra-axial lesion along the right parietal lobe and possible lesions in the atrium of the left lateral ventricle. Several of these lesions demonstrate associated edema and intralesional hemorrhage, including the dominant left parieto-occipital lesion. These are overall concerning for metastatic disease. Additional lesions may be present, but obscured by the motion artifact. Electronically Signed   By: Merilyn Baba M.D.   On: 10/16/2021 02:05      Thanvi Blincoe T. [place]  If 7PM-7AM, please contact night-coverage [email or web address] 10/16/2021, 11:53 AM

## 2021-10-16 NOTE — Evaluation (Signed)
Physical Therapy Evaluation Patient Details Name: Natalie Contreras MRN: [id number] DOB: Dec 28, 1965 Today's Date: 10/16/2021  History of Present Illness  Pt is a 56 yr old female who presented due to feeling weak over the last several months. CT showed multiple nodular opacities, concerning for malignancy and metastases. with intracranial masses as well, Neurosurgery and Oncology on board;  Pt HTN, DM, CHF, PAD, R BKA  Clinical Impression   Pt admitted with above diagnosis. Lives in an apartment, modified independent with prosthesis; Uses RW for household amb, uses wheelchair as well; Plans to go to her daughter's home at [place], where she can stay on main floor with full bathroom (tub/shower); Daughter's home has many steps to enter, but reliable handrails; Presents to PT with generalized weakness, and decr endurance; Still, able to manage short-distance amb and transfers well with prosthesis and RW; Will need to focus efforts on stair training at this time to allow for getting into the home; Pt currently with functional limitations due to the deficits listed below (see PT Problem List). Pt will benefit from skilled PT to increase their independence and safety with mobility to allow discharge to the venue listed below.          Recommendations for follow up therapy are one component of a multi-disciplinary discharge planning process, led by the attending physician.  Recommendations may be updated based on patient status, additional functional criteria and insurance authorization.  Follow Up Recommendations Home health PT    Assistance Recommended at Discharge Set up Supervision/Assistance  Patient can return home with the following  Help with stairs or ramp for entrance;Assistance with cooking/housework    Equipment Recommendations Other (comment) Sales executive chair for stair negotiation)  Recommendations for Other Services       Functional Status Assessment Patient has had a recent decline in their  functional status and demonstrates the ability to make significant improvements in function in a reasonable and predictable amount of time.     Precautions / Restrictions Precautions Precautions: Fall (pt reported sometimes bowel incontience)      Mobility  Bed Mobility                    Transfers Overall transfer level: Needs assistance Equipment used: Rolling walker (2 wheels) Transfers: Sit to/from Stand Sit to Stand: Min guard           General transfer comment: increase time; heavy dependence on forward trunk flexion to initiate rise, then pushes up to fully upright with UEs on RW    Ambulation/Gait Ambulation/Gait assistance: Min guard (with and without physical contact ) Gait Distance (Feet): 20 Feet (in room ambulation)              Stairs         General stair comments: Discussed options for stairs with pt and daughter, including watching a video of negotiating stairs using a shower chair (with a short leg side and a long leg side to give pt a relatively level seat)  Wheelchair Mobility    Modified Rankin (Stroke Patients Only)       Balance     Sitting balance-Leahy Scale: Good Sitting balance - Comments: able to complete LE dressing while sitting at EOB   Standing balance support: Bilateral upper extremity supported Standing balance-Leahy Scale: Fair                               Pertinent Vitals/Pain  Pain Assessment Pain Assessment: No/denies pain    Home Living Family/patient expects to be discharged to:: Other (Comment) (Plans to stay with daughter) Living Arrangements: Children Available Help at Discharge: Family Type of Home: House Home Access: Stairs to enter Entrance Stairs-Rails: Right Entrance Stairs-Number of Steps: 11 (6, landing, 5) Alternate Level Stairs-Number of Steps: Flight Home Layout: Two level;Able to live on main level with bedroom/bathroom Home Equipment: Rolling Walker (2  wheels);Wheelchair - manual;Tub bench;BSC/3in1 Additional Comments: Above reflects daughter's home, where she plans to go after this admission    Prior Function Prior Level of Function : Independent/Modified Independent             Mobility Comments: Pt reported using walker when out of the home and sometimes in the home WC level the rest at home level ADLs Comments: Uses tub transfer bench     Hand Dominance   Dominant Hand: Right    Extremity/Trunk Assessment   Upper Extremity Assessment Upper Extremity Assessment: Defer to OT evaluation    Lower Extremity Assessment Lower Extremity Assessment: Generalized weakness;RLE deficits/detail (Pt reports recent generalized fatigue and decr endurance) RLE Deficits / Details: BKA; dons prosthesis well    Cervical / Trunk Assessment Cervical / Trunk Assessment: Kyphotic  Communication   Communication: No difficulties  Cognition Arousal/Alertness: Awake/alert Behavior During Therapy: WFL for tasks assessed/performed Overall Cognitive Status: Within Functional Limits for tasks assessed                                          General Comments      Exercises     Assessment/Plan    PT Assessment Patient needs continued PT services  PT Problem List Decreased strength;Decreased range of motion;Decreased activity tolerance;Decreased balance;Decreased mobility;Decreased coordination;Decreased knowledge of use of DME;Cardiopulmonary status limiting activity       PT Treatment Interventions DME instruction;Gait training;Stair training;Functional mobility training;Therapeutic activities;Therapeutic exercise;Balance training;Patient/family education;Wheelchair mobility training    PT Goals (Current goals can be found in the Care Plan section)  Acute Rehab PT Goals Patient Stated Goal: Be able to get up the stairs into her duaghter's home PT Goal Formulation: With patient/family Time For Goal Achievement:  10/30/21 Potential to Achieve Goals: Good    Frequency Min 3X/week     Co-evaluation               AM-PAC PT "6 Clicks" Mobility  Outcome Measure Help needed turning from your back to your side while in a flat bed without using bedrails?: None Help needed moving from lying on your back to sitting on the side of a flat bed without using bedrails?: None Help needed moving to and from a bed to a chair (including a wheelchair)?: A Little Help needed standing up from a chair using your arms (e.g., wheelchair or bedside chair)?: A Little Help needed to walk in hospital room?: A Little Help needed climbing 3-5 steps with a railing? : A Lot 6 Click Score: 19    End of Session   Activity Tolerance: Patient tolerated treatment well Patient left: in chair;with call bell/phone within reach;with family/visitor present Nurse Communication: Mobility status PT Visit Diagnosis: Other abnormalities of gait and mobility (R26.89);Muscle weakness (generalized) (M62.81)    Time: 6712-4580 PT Time Calculation (min) (ACUTE ONLY): 32 min   Charges:   PT Evaluation $PT Eval Moderate Complexity: 1 Mod PT Treatments $Gait Training: 8-22 mins  Roney [place], [place]  Acute Rehabilitation Services Pager [phone] Office [phone]   Colletta [place] 10/16/2021, 4:13 PM

## 2021-10-16 NOTE — Evaluation (Addendum)
Occupational Therapy Evaluation Patient Details Name: Natalie Contreras MRN: [id number] DOB: 24-Dec-1965 Today's Date: 10/16/2021   History of Present Illness Pt is a 56 yr old female who presented due to feeling weak over the last several months. CT showed multiple nodular opacities, concerning for malignancy and metastases. with intracranial masses as well, Neurosurgery and Oncology on board;  Pt HTN, DM, CHF, PAD, R BKA   Clinical Impression   Pt at PLOF uses wc and walker for mobility in home and walker when out with support. Pt at this time bed mobility with bed rail and increase in time, donning prothesis in sitting post set up, standing ADLS at sink with min guard  and room level mobility with min guard and FW. Pt noted to have decrease  in stamina when standing and required seated rest break with ADLS. Pt currently with functional limitations due to the deficits listed below (see OT Problem List).  Pt will benefit from skilled OT to increase their safety and independence with ADL and functional mobility for ADL to facilitate discharge to venue listed below.        Recommendations for follow up therapy are one component of a multi-disciplinary discharge planning process, led by the attending physician.  Recommendations may be updated based on patient status, additional functional criteria and insurance authorization.   Follow Up Recommendations  No OT follow up    Assistance Recommended at Discharge Intermittent Supervision/Assistance  Patient can return home with the following A little help with bathing/dressing/bathroom;Assistance with cooking/housework;Assist for transportation    Functional Status Assessment  Patient has had a recent decline in their functional status and demonstrates the ability to make significant improvements in function in a reasonable and predictable amount of time.  Equipment Recommendations  None recommended by OT    Recommendations for Other Services        Precautions / Restrictions Precautions Precautions: Fall (pt reported sometimes bowel incontience) Restrictions Weight Bearing Restrictions: No      Mobility Bed Mobility Overal bed mobility: Modified Independent             General bed mobility comments: needs bed rail with bed flat and increase time    Transfers Overall transfer level: Needs assistance   Transfers: Sit to/from Stand Sit to Stand: Min guard           General transfer comment: increase time      Balance Overall balance assessment: Needs assistance Sitting-balance support: Feet supported Sitting balance-Leahy Scale: Good Sitting balance - Comments: able to complete LE dressing while sitting at EOB   Standing balance support: Bilateral upper extremity supported Standing balance-Leahy Scale: Fair                             ADL either performed or assessed with clinical judgement   ADL Overall ADL's : Needs assistance/impaired Eating/Feeding: Independent;Sitting Eating/Feeding Details (indicate cue type and reason): NPO at this time but able to simulate Grooming: Wash/dry hands;Wash/dry face;Min guard;Cueing for safety;Cueing for sequencing;Standing   Upper Body Bathing: Set up;Cueing for safety;Cueing for sequencing;Sitting   Lower Body Bathing: Min guard;Cueing for safety;Cueing for sequencing;Sitting/lateral leans   Upper Body Dressing : Set up;Cueing for safety;Cueing for sequencing;Sitting   Lower Body Dressing: Min guard;Sitting/lateral leans   Toilet Transfer: Min guard;Rolling walker (2 wheels);Cueing for safety;Cueing for sequencing   Toileting- Clothing Manipulation and Hygiene: Min guard;Cueing for safety;Cueing for sequencing;Sit to/from stand   Tub/ Shower  Transfer: Min guard;Cueing for safety;Cueing for sequencing;Tub bench;Rolling walker (2 wheels)   Functional mobility during ADLs: Min guard;Cueing for safety;Cueing for sequencing;Rolling walker (2  wheels)       Vision         Perception     Praxis      Pertinent Vitals/Pain Pain Assessment Pain Assessment: No/denies pain     Hand Dominance Right   Extremity/Trunk Assessment Upper Extremity Assessment Upper Extremity Assessment: RUE deficits/detail;LUE deficits/detail RUE Deficits / Details: AROM to about 120 LUE Deficits / Details: AROM to about 120   Lower Extremity Assessment Lower Extremity Assessment: Defer to PT evaluation (R BKA)   Cervical / Trunk Assessment Cervical / Trunk Assessment: Kyphotic   Communication Communication Communication: No difficulties   Cognition Arousal/Alertness: Awake/alert Behavior During Therapy: WFL for tasks assessed/performed Overall Cognitive Status: Within Functional Limits for tasks assessed                                       General Comments       Exercises     Shoulder Instructions      Home Living Family/patient expects to be discharged to:: Private residence Living Arrangements: Non-relatives/Friends Available Help at Discharge:  (roomate) Type of Home: House Home Access: Level entry     Home Layout: One level     Bathroom Shower/Tub: Teacher, early years/pre: Standard Bathroom Accessibility: Yes How Accessible: Accessible via wheelchair Home Equipment: Conservation officer, nature (2 wheels);Wheelchair - manual;Tub bench;BSC/3in1          Prior Functioning/Environment Prior Level of Function : Independent/Modified Independent             Mobility Comments: Pt reported using walker when out of the home and sometimes in the home WC level the rest at home level ADLs Comments: reported they were not driving but completion of ADLS        OT Problem List: Decreased strength;Decreased activity tolerance;Decreased safety awareness;Impaired balance (sitting and/or standing)      OT Treatment/Interventions: Self-care/ADL training;Therapeutic exercise;Neuromuscular education;DME  and/or AE instruction;Therapeutic activities;Patient/family education;Balance training    OT Goals(Current goals can be found in the care plan section) Acute Rehab OT Goals Patient Stated Goal: Pt reported to feel stronger OT Goal Formulation: With patient Time For Goal Achievement: 10/25/21 Potential to Achieve Goals: Good ADL Goals Pt Will Perform Upper Body Bathing: with modified independence;sitting Pt Will Perform Lower Body Bathing: with modified independence;sitting/lateral leans Pt Will Perform Tub/Shower Transfer: with modified independence;ambulating;tub bench  OT Frequency: Min 2X/week    Co-evaluation              AM-PAC OT "6 Clicks" Daily Activity     Outcome Measure Help from another person eating meals?: None Help from another person taking care of personal grooming?: A Little Help from another person toileting, which includes using toliet, bedpan, or urinal?: A Little Help from another person bathing (including washing, rinsing, drying)?: A Little Help from another person to put on and taking off regular upper body clothing?: A Little Help from another person to put on and taking off regular lower body clothing?: A Little 6 Click Score: 19   End of Session Equipment Utilized During Treatment: Gait belt;Rolling walker (2 wheels) Nurse Communication: Mobility status  Activity Tolerance: Patient limited by fatigue Patient left: in chair;with call bell/phone within reach  OT Visit Diagnosis: Unsteadiness on feet (R26.81);Other  abnormalities of gait and mobility (R26.89);Muscle weakness (generalized) (M62.81)                Time: 0459-9774 OT Time Calculation (min): 30 min Charges:  OT General Charges $OT Visit: 1 Visit OT Evaluation $OT Eval Low Complexity: 1 Low  Self care: 8-22 mins   Joeseph Amor OTR/L  Acute Rehab Services  [phone] office number [phone] pager number   Joeseph Amor 10/16/2021, 10:19 AM

## 2021-10-16 NOTE — Interval H&P Note (Signed)
History and Physical Interval Note:  10/16/2021 11:16 AM  Natalie Contreras  has presented today for surgery, with the diagnosis of FOBT positive anemia.  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  The various methods of treatment have been discussed with the patient and family. After consideration of risks, benefits and other options for treatment, the patient has consented to  Procedure(s): ESOPHAGOGASTRODUODENOSCOPY (EGD) WITH PROPOFOL (N/A) as a surgical intervention.  The patient's history has been reviewed, patient examined, no change in status, stable for surgery.  I have reviewed the patient's chart and labs.  Questions were answered to the patient's satisfaction.     Sharyn Creamer

## 2021-10-16 NOTE — H&P (Signed)
Chief Complaint: Patient was seen in consultation today for soft tissue nodule core biopsy  at the request of Dr. Bettina Gavia  Referring Physician(s): Dr. Bettina Gavia  Supervising Physician: Greggory Keen  Patient Status: [facility] - In-pt  History of Present Illness: Natalie Contreras is a 56 y.o. female w/ PMH of DM2, neuropathy, diastolic CHF, HTN, PAD, right BKA and tobacco abuse. Pt presented to ED c/o gen weakness x 2 months, melena and lymphadenopathy on head, neck, chest and back. Pt was admitted with anemia and received 2 units PRBCs. Pt was found to have L renal mass with invasion of the left posterior abdominal wall concerning for RCC with widespread metastasis. Dr. Bettina Gavia has referred pt to IR for soft tissue nodule core biopsy  to confirm suspected RCC. Dr. Annamaria Boots, IR, approved biopsy.   CT chest/abd 10/13/21: IMPRESSION: 10 cm left renal mass with invasion of the left posterior abdominal wall soft tissues, consistent with primary renal cell carcinoma. Adjacent metastatic soft tissue nodules also noted within the left perinephric space. Diffuse bilateral pulmonary metastases.   Two small masses along the capsular surface of the right kidney, which may represent synchronous primary renal cell carcinomas or metastatic disease. Metastatic disease also involving Gerota's fascia on the right.   Mild abdominal lymphadenopathy, consistent with metastatic disease.   Diffuse liver metastases.   Small right adrenal mass, suspicious for metastatic disease.   Diffuse bilateral pulmonary metastases. Bilateral hilar lymph node metastases.   Diffuse chest and abdominal wall soft tissue metastases.    Past Medical History:  Diagnosis Date   Abscess of foot 08/09/2019   WITH ULCER  RIGHT FOOT   Acute diastolic CHF (congestive heart failure) ([place]) 10/28/2016   Carotid stenosis, asymptomatic, bilateral 01/15/2017   Diabetes mellitus without complication (HCC)    Heart murmur     Hypertension    PAD (peripheral artery disease) ([place]) 04/10/2017   Left ABI 0.74. Right ABI 0.96.    Past Surgical History:  Procedure Laterality Date   ABDOMINAL AORTOGRAM W/LOWER EXTREMITY N/A 08/10/2019   Procedure: ABDOMINAL AORTOGRAM W/LOWER EXTREMITY;  Surgeon: Waynetta Sandy, MD;  Location: [place] [facility];  Service: Cardiovascular;  Laterality: N/A;   AMPUTATION Right 09/03/2019   Procedure: RIGHT BELOW KNEE AMPUTATION;  Surgeon: Newt Minion, MD;  Location: [place];  Service: Orthopedics;  Laterality: Right;   AMPUTATION TOE Left 05/05/2017   Procedure: LEFT SECOND TOE AMPUTATION;  Surgeon: Aviva Signs, MD;  Location: [facility];  Service: General;  Laterality: Left;   CATARACT EXTRACTION W/PHACO Left 02/08/2016   Procedure: CATARACT EXTRACTION PHACO AND INTRAOCULAR LENS PLACEMENT ([place]);  Surgeon: Tonny Branch, MD;  Location: [facility];  Service: Ophthalmology;  Laterality: Left;  CDE 11.43    CATARACT EXTRACTION W/PHACO Right 02/26/2016   Procedure: CATARACT EXTRACTION PHACO AND INTRAOCULAR LENS PLACEMENT RIGHT; CDE:  16.90;  Surgeon: Tonny Branch, MD;  Location: [facility];  Service: Ophthalmology;  Laterality: Right;   CHOLECYSTECTOMY     FOREIGN BODY REMOVAL Left 04/09/2017   Procedure: FOREIGN BODY REMOVAL ADULT FOOT;  Surgeon: Aviva Signs, MD;  Location: [facility];  Service: General;  Laterality: Left;   I & D EXTREMITY Right 08/11/2019   Procedure: IRRIGATION AND DEBRIDEMENT EXTREMITY;  Surgeon: Newt Minion, MD;  Location: [place];  Service: Orthopedics;  Laterality: Right;   PERIPHERAL VASCULAR ATHERECTOMY Right 08/10/2019   Procedure: PERIPHERAL VASCULAR ATHERECTOMY;  Surgeon: Waynetta Sandy, MD;  Location: [place] [facility];  Service: Cardiovascular;  Laterality: Right;  superficial femoral   PERIPHERAL VASCULAR BALLOON ANGIOPLASTY Right 08/10/2019   Procedure: PERIPHERAL VASCULAR BALLOON ANGIOPLASTY;  Surgeon: Waynetta Sandy, MD;  Location: [place]  [facility];  Service: Cardiovascular;  Laterality: Right;  anterior tibial   TRANSMETATARSAL AMPUTATION Left 07/21/2017   Procedure: TRANSMETATARSAL AMPUTATION LEFT FOOT;  Surgeon: Aviva Signs, MD;  Location: [facility];  Service: General;  Laterality: Left;    Allergies: Patient has no known allergies.  Medications: Prior to Admission medications   Medication Sig Start Date End Date Taking? Authorizing Provider  acetaminophen (TYLENOL) 325 MG tablet Take 1-2 tablets (325-650 mg total) by mouth every 4 (four) hours as needed for mild pain. 09/13/19  Yes Love, Ivan Anchors, PA-C  aspirin EC 81 MG tablet Take 1 tablet (81 mg total) by mouth daily with breakfast. 06/15/19  Yes Emokpae, Courage, MD  carvedilol (COREG) 3.125 MG tablet TAKE (1) TABLET BY MOUTH TWICE DAILY WITH A MEAL. Patient taking differently: Take 3.125 mg by mouth 2 (two) times daily with a meal. 10/20/19  Yes Jamse Arn, MD  Continuous Blood Gluc Sensor (DEXCOM G6 SENSOR) MISC SMARTSIG:1 Each Topical Every 10 Days 10/03/21  Yes [provider]  ergocalciferol (VITAMIN D2) 1.25 MG (50000 UT) capsule Take 50,000 Units by mouth once a week. Sunday   Yes [provider]  Finerenone (KERENDIA) 10 MG TABS Take 1 tablet by mouth daily.   Yes [provider]  hydrALAZINE (APRESOLINE) 50 MG tablet Take 50 mg by mouth 2 (two) times daily. 07/15/21  Yes [provider]  insulin aspart (NOVOLOG) 100 UNIT/ML injection Up to 100 units per day in omni pod. Omnipod to be filled with Novolog to be accurately injected by Omnipod based on settings.   Yes [provider]  JARDIANCE 25 MG TABS tablet Take 25 mg by mouth daily. 10/03/21  Yes [provider]  simvastatin (ZOCOR) 40 MG tablet Take 40 mg by mouth at bedtime.    Yes [provider]  torsemide (DEMADEX) 20 MG tablet Take 1 tablet (20 mg total) by mouth daily. 06/15/19  Yes Emokpae, Courage, MD  docusate sodium (COLACE) 100 MG capsule  Take 1 capsule (100 mg total) by mouth 2 (two) times daily. Patient not taking: Reported on 10/13/2021 09/16/19   Love, Ivan Anchors, PA-C  metFORMIN (GLUCOPHAGE) 1000 MG tablet Take 1 tablet (1,000 mg total) by mouth daily with breakfast. Patient not taking: Reported on 10/13/2021 09/16/19   Love, Ivan Anchors, PA-C  methocarbamol (ROBAXIN) 500 MG tablet Take 1 tablet (500 mg total) by mouth every 6 (six) hours as needed for muscle spasms. Patient not taking: Reported on 10/13/2021 09/16/19   Bary Leriche, PA-C  Multiple Vitamin (MULTIVITAMIN WITH MINERALS) TABS tablet Take 1 tablet by mouth daily. Patient not taking: Reported on 10/13/2021 09/14/19   Bary Leriche, PA-C  oxyCODONE (OXY IR/ROXICODONE) 5 MG immediate release tablet Take 1 tablet (5 mg total) by mouth daily as needed for severe pain. Patient not taking: Reported on 10/13/2021 09/16/19   Love, Ivan Anchors, PA-C  polyethylene glycol (MIRALAX / GLYCOLAX) 17 g packet Take 17 g by mouth 2 (two) times daily. Patient not taking: Reported on 10/13/2021 09/16/19   Love, Ivan Anchors, PA-C  traMADol (ULTRAM) 50 MG tablet Take 1 tablet (50 mg total) by mouth every 12 (twelve) hours as needed for moderate pain. Patient not taking: Reported on 10/13/2021 09/16/19   Bary Leriche, PA-C  traZODone (DESYREL) 50 MG tablet Take 0.5-1 tablets (25-50 mg total) by mouth at bedtime as needed for sleep. Patient not taking: Reported on 10/13/2021 09/16/19   Love, Ivan Anchors, PA-C     Family History  Problem Relation Age of Onset   Lung cancer Mother    Lung cancer Father    AAA (abdominal aortic aneurysm) Brother     Social History   Socioeconomic History   Marital status: Single    Spouse name: Not on file   Number of children: Not on file   Years of education: Not on file   Highest education level: Not on file  Occupational History   Not on file  Tobacco Use   Smoking status: Every Day    Packs/day: 0.50    Years: 25.00    Pack years: 12.50    Types:  Cigarettes    Start date: 09/16/1984   Smokeless tobacco: Never  Vaping Use   Vaping Use: Never used  Substance and Sexual Activity   Alcohol use: No   Drug use: No   Sexual activity: Yes    Birth control/protection: None  Other Topics Concern   Not on file  Social History Narrative   Not on file   Social Determinants of Health   Financial Resource Strain: Not on file  Food Insecurity: Not on file  Transportation Needs: Not on file  Physical Activity: Not on file  Stress: Not on file  Social Connections: Not on file    Review of Systems: A 12 point ROS discussed and pertinent positives are indicated in the HPI above.  All other systems are negative.  Review of Systems  Constitutional:  Negative for appetite change, chills and fever.  HENT:  Negative for nosebleeds.   Eyes:  Negative for visual disturbance.  Respiratory:  Negative for cough and shortness of breath.   Cardiovascular:  Negative for chest pain and leg swelling.  Gastrointestinal:  Negative for abdominal pain, blood in stool, nausea and vomiting.  Genitourinary:  Negative for hematuria.  Neurological:  Positive for weakness. Negative for dizziness, light-headedness and headaches.   Vital Signs: BP (!) 152/51 (BP Location: Left Arm)    Pulse 73    Temp 98.1 F (36.7 C)    Resp 20    Ht 5\' 4"  (1.626 m)    Wt 198 lb 10.2 oz (90.1 kg)    LMP 12/16/2015    SpO2 98%    BMI 34.10 kg/m   Physical Exam Constitutional:      Appearance: She is ill-appearing.  HENT:     Head: Normocephalic and atraumatic.     Mouth/Throat:     Mouth: Mucous membranes are moist.     Pharynx: Oropharynx is clear.  Cardiovascular:     Rate and Rhythm: Normal rate and regular rhythm.     Pulses: Normal pulses.     Heart sounds: Normal heart sounds. No murmur heard.   No friction rub. No gallop.  Pulmonary:     Effort: Pulmonary effort is normal. No respiratory distress.     Breath sounds: Normal breath sounds. No stridor. No  wheezing, rhonchi or rales.  Abdominal:     General: Bowel sounds are normal.     Palpations: Abdomen is soft.     Tenderness: There is no abdominal tenderness. There is no guarding.  Musculoskeletal:     Left lower leg: Edema present.     Comments: BKA to RLE  Skin:    General:  Skin is warm and dry.  Neurological:     Mental Status: She is alert and oriented to person, place, and time.  Psychiatric:        Mood and Affect: Mood normal.        Behavior: Behavior normal.        Thought Content: Thought content normal.        Judgment: Judgment normal.    Imaging: DG Chest 2 View  Result Date: 10/13/2021 CLINICAL DATA:  smoker. weakness. rule out infection/malignancy EXAM: CHEST - 2 VIEW COMPARISON:  April 07, 2017 FINDINGS: The cardiomediastinal silhouette is enlarged in contour.Atherosclerotic calcifications. No pleural effusion. No pneumothorax. Multiple nodular opacities throughout bilateral lungs. Surgical clips project over the upper abdomen. Suggestion of sclerosis of several vertebral bodies. IMPRESSION: 1. Multiple nodular opacities bilaterally. Suggestion of multifocal sclerosis of the vertebral bodies versus summation artifact. Findings are concerning for multifocal metastases. Recommend further dedicated evaluation with cross-sectional imaging. Electronically Signed   By: Valentino Saxon M.D.   On: 10/13/2021 15:29   CT Head Wo Contrast  Result Date: 10/13/2021 CLINICAL DATA:  dizziness. blurred vision. EXAM: CT HEAD WITHOUT CONTRAST TECHNIQUE: Contiguous axial images were obtained from the base of the skull through the vertex without intravenous contrast. RADIATION DOSE REDUCTION: This exam was performed according to the departmental dose-optimization program which includes automated exposure control, adjustment of the mA and/or kV according to patient size and/or use of iterative reconstruction technique. COMPARISON:  February 17, 2017. FINDINGS: Brain: There is a lesion in the  LEFT parieto-occipital lobe with adjacent vasogenic edema and a peripheral rim of intrinsic hyperdensity. It measures 2.6 x 2.4 by 2.7 cm. There is an additional intrinsically hyperdense region along the RIGHT frontoparietal falx which measures 7 by 5 mm which is concerning for an additional site of disease (series 2, image 23). Hyperdensity of the LEFT frontal lobe measuring approximately 5 mm likely reflects an additional site of disease (series 2, image 25). Hypodensity of the RIGHT occipital lobe with a small amount of intrinsic hyperdensity likely reflecting additional lesion (series 2, image 12; series 5, image 28). Hypodensity of the LEFT inferior cerebellum, consistent with the sequela of remote prior infarction and similar comparison to prior. No significant midline shift. Vascular: Vascular calcifications of the carotid siphons. Skull: No acute fracture. Sinuses/Orbits: LEFT mastoid effusion. Other: None. IMPRESSION: 1. There is a 2.7 cm peripherally hyperdense masslike area in the LEFT parieto-occipital lobe with adjacent vasogenic edema. 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 Recommend further evaluation with dedicated brain MRI with and without contrast. 2. There are likely additional lesions in the bilateral cerebral hemispheres. These results were called by telephone at the time of interpretation on 10/13/2021 at 3:20 pm to provider MARGAUX VENTER , who verbally acknowledged these results. Electronically Signed   By: Valentino Saxon M.D.   On: 10/13/2021 15:28   MR BRAIN W WO CONTRAST  Result Date: 10/16/2021 CLINICAL DATA:  CNS neoplasm, [facility] protocol EXAM: MRI HEAD WITHOUT AND WITH CONTRAST TECHNIQUE: Multiplanar, multiecho pulse sequences of the brain and surrounding structures were obtained without and with intravenous contrast. CONTRAST:  56mL GADAVIST GADOBUTROL 1 MMOL/ML IV SOLN COMPARISON:  No prior MRI, correlation is made with 10/13/2021 CT head FINDINGS: Evaluation is  limited by motion artifact. Brain: Multiple enhancing masses in the bilateral cerebral hemispheres, the largest of which is in the left parietooccipital lobe and measures up to 3.3 x 2.9 x 3.3 cm (AP x TR x CC) (series 9, image  92). Additional smaller lesions include a left parietal lesion that measures 0.7 x 0.5 x 0.7 cm (series 9, image 39 and series 10, image 29), a left frontal lobe lesion that measures 0.6 x 0.4 x 0.6 cm (series 9, image 139 and series 10, image 43), a right frontal lobe lesion that measures 0.3 x 0.4 x 0.4 cm (series 9, image 50 and series 10, image 52), a right extra-axial lesion along the falx adjacent to the right parietal lobe that measures 1.0 x 0.5 x 1.0 cm (series 9, image 60 and series 10, image 23), right parietal lesion that measures 1.0 x 0.9 x 1.0 cm (series 9, image 80 and series 10, image 20), a left parietal lesion that measures 0.4 x 0.8 x 0.5 cm (series 9, image 82 and series 10, image 22), a left occipital lesion that measures 0.5 x 0.8 x 0.6 cm (series 9, image 124 and series 10, image 8), and a right occipital lesion (or possibly 2 adjacent lesions) that measures 1.2 x 1.7 x 0.9 cm (series 9, image 128 and series 10, image 8). Possible additional lesions in the right frontal lobe adjacent to the right lateral ventricle, which is faintly visualized on the coronal postcontrast sequences (series 10, image 32), measuring 0.6 x 0.5 cm, and in the atrium of the right lateral ventricle (series 11, image 26), measuring approximately 0.4 cm, best seen on the sagittal sequence. These lesions are associated with surrounding T2 hyperintense signal, likely edema, with the largest area of edema surrounding the dominant left parieto-occipital lesion. Several of these lesions are also associated with hemosiderin deposition (series 6, image 54, 76, 81, and 82). Two rounded areas of hemosiderin deposition in the left lateral ventricle atrium (series 6, image 76 and 69) are difficult to find  definite enhancing lesions to correlate with on the axial sequences, given enhancement in the choroid plexus, but possible correlates are suspected on the sagittal postcontrast sequence (series 11, image 24 and 26). No other areas of parenchymal hemorrhage. No definite midline shift. 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 No hydrocephalus or extra-axial collection. No diffusion-weighted sequence was available to evaluate for infarct given the selected protocol. Vascular: The left PCA flow void is not well visualized, however the vessel is seen on postcontrast imaging. Otherwise normal flow voids. Skull and upper cervical spine: Normal marrow signal. Sinuses/Orbits: Status post bilateral lens replacements. Otherwise negative. Other: Trace fluid in the mastoid air cells. IMPRESSION: Evaluation is limited by motion artifact. Within this limitation, multiple enhancing lesions in the bilateral cerebral hemispheres, with additional extra-axial lesion along the right parietal lobe and possible lesions in the atrium of the left lateral ventricle. Several of these lesions demonstrate associated edema and intralesional hemorrhage, including the dominant left parieto-occipital lesion. These are overall concerning for metastatic disease. Additional lesions may be present, but obscured by the motion artifact. Electronically Signed   By: Merilyn Baba M.D.   On: 10/16/2021 02:05   CT CHEST ABDOMEN PELVIS W CONTRAST  Result Date: 10/13/2021 CLINICAL DATA:  Brain metastasis of unknown primary. EXAM: CT CHEST, ABDOMEN, AND PELVIS WITH CONTRAST TECHNIQUE: Multidetector CT imaging of the chest, abdomen and pelvis was performed following the standard protocol during bolus administration of intravenous contrast. RADIATION DOSE REDUCTION: This exam was performed according to the departmental dose-optimization program which includes automated exposure control, adjustment of the mA and/or kV  according to patient size and/or use of iterative reconstruction technique. CONTRAST:  88mL OMNIPAQUE IOHEXOL  300 MG/ML  SOLN COMPARISON:  None. FINDINGS: CT CHEST FINDINGS Cardiovascular: Mild cardiomegaly. Aortic and coronary atherosclerotic calcification noted. Mediastinum/Lymph Nodes: Mild bilateral hilar lymphadenopathy is seen. No pathologically enlarged mediastinal or axillary lymph nodes are identified. Lungs/Pleura: Multiple solid bilateral pulmonary nodules are seen, consistent with diffuse pulmonary metastases. Index nodule in the medial right lower lobe measures 2.9 x 1.7 cm on image 92/3. Index nodule in the posterolateral left lower lobe measures 4.4 x 2.2 cm on image 109/3. Small left pleural effusion also seen. Musculoskeletal: Multiple chest wall soft tissue masses are seen bilaterally, involving the breasts, chest wall muscles and subcutaneous tissues. The 2 largest masses involve the right pectoralis major muscle, measuring 3.3 x 2.9 cm on image 12/2, and the right supraspinatus muscle measuring 5.7 x 3.2 cm on image 21/2. CT ABDOMEN AND PELVIS FINDINGS Hepatobiliary: Multiple hypovascular masses are seen throughout the right and left hepatic lobes, largest in the lateral dome of the right hepatic lobe measuring 10.7 x 5.1 cm. These are consistent diffuse liver metastases. Prior cholecystectomy. No evidence of biliary obstruction. Pancreas:  No mass or inflammatory changes. Spleen:  Within normal limits in size and appearance. Adrenals/Urinary tract: A small right adrenal mass is seen measuring 2.0 x 1.5 cm, which is indeterminate but suspicious for adrenal metastasis. A large heterogeneously enhancing mass is seen involving the posterior left kidney, which shows invasion of the posterior abdominal wall soft tissues. This measures 10.3 x 8.9 cm, and is consistent with primary renal cell carcinoma. Smaller enhancing soft tissue nodules are also seen in the left posterior pararenal space, consistent  with metastatic disease. Two small masses are seen along the capsular surface of the anterior mid and lower poles of the right kidney, which measure 2.2 cm and 1.5 cm in diameter. These could represent synchronous primary renal cell carcinomas of the right kidney, or metastatic disease. Two soft tissue nodules are involve the lateral portion of Gerota's fascia on the right, largest measuring 2.2 x 1.6 cm. A subcapsular cyst measuring 4 cm is also seen in the lateral interpolar region of the right kidney. Stomach/Bowel: No evidence of obstruction, inflammatory process, or abnormal fluid collections. Vascular/Lymphatic: Mild lymphadenopathy is seen in the left paraaortic region, largest measuring 10 mm on image 67/2. A 1.3 cm lymph node is seen in the deep left abdominal small bowel mesentery adjacent to the duodenum. No pelvic lymphadenopathy identified. Aortic atherosclerotic calcification noted. Left retroaortic renal vein is noted, however there is no evidence of renal vein or IVC thrombus. Reproductive: No masses identified. Small amount of gas noted within the endometrial cavity uterus. Other:  None. Musculoskeletal: No suspicious bone lesions identified. Multiple small masses are seen scattered throughout the abdominal wall soft tissues, consistent with metastatic disease. IMPRESSION: 10 cm left renal mass with invasion of the left posterior abdominal wall soft tissues, consistent with primary renal cell carcinoma. Adjacent metastatic soft tissue nodules also noted within the left perinephric space. Diffuse bilateral pulmonary metastases. Two small masses along the capsular surface of the right kidney, which may represent synchronous primary renal cell carcinomas or metastatic disease. Metastatic disease also involving Gerota's fascia on the right. Mild abdominal lymphadenopathy, consistent with metastatic disease. Diffuse liver metastases. Small right adrenal mass, suspicious for metastatic disease. Diffuse  bilateral pulmonary metastases. 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 Electronically Signed   By: Marlaine Hind M.D.   On: 10/13/2021 17:53    Labs:  CBC: Recent Labs  10/13/21 1428 10/14/21 0221 10/15/21 0325 10/15/21 1433 10/16/21 0324  WBC 5.6 5.1 5.4  --  5.3  HGB 6.2* 7.4* 7.1* 7.3* 7.4*  HCT 21.5* 24.3* 23.9* 25.0* 24.4*  PLT 388 338 340  --  324    COAGS: Recent Labs    10/14/21 1255  INR 1.1  APTT 27    BMP: Recent Labs    10/13/21 1428 10/14/21 0221 10/15/21 0325 10/16/21 0324  NA 138 140 141 140  K 3.1* 3.4* 4.0 3.8  CL 101 106 106 107  CO2 25 22 23 22   GLUCOSE 113* 104* 90 69*  BUN 70* 67* 60* 55*  CALCIUM 8.8* 8.3* 8.5* 8.6*  CREATININE 1.78* 1.80* 1.75* 1.74*  GFRNONAA 34* 33* 34* 34*    LIVER FUNCTION TESTS: Recent Labs    10/13/21 1428 10/15/21 0325 10/16/21 0324  BILITOT 0.6  --  0.5  AST 11*  --  12*  ALT 11  --  11  ALKPHOS 107  --  99  PROT 6.5  --  5.7*  ALBUMIN 3.0* 2.5* 2.5*    TUMOR MARKERS: No results for input(s): AFPTM, CEA, CA199, CHROMGRNA in the last 8760 hours.  Assessment and Plan: Hx of DM2, neuropathy, diastolic CHF, HTN, PAD, right BKA and tobacco abuse. Pt presented to ED c/o gen weakness x 2 months, melena and lymphadenopathy on head, neck, chest and back. Pt was admitted with anemia and received 2 units PRBCs. Pt was found to have L renal mass with invasion of the left posterior abdominal wall concerning for RCC with widespread metastasis. Dr. Bettina Gavia has referred pt to IR for soft tissue nodule core biopsy to confirm suspected RCC. Dr. Annamaria Boots, IR, approved biopsy.    CT chest/abd 10/13/21: IMPRESSION: 10 cm left renal mass with invasion of the left posterior abdominal wall soft tissues, consistent with primary renal cell carcinoma. Adjacent metastatic soft tissue nodules also noted within the left perinephric space. Diffuse bilateral pulmonary  metastases.   Two small masses along the capsular surface of the right kidney, which may represent synchronous primary renal cell carcinomas or metastatic disease. Metastatic disease also involving Gerota's fascia on the right.   Mild abdominal lymphadenopathy, consistent with metastatic disease.   Diffuse liver metastases.   Small right adrenal mass, suspicious for metastatic disease.   Diffuse bilateral pulmonary metastases. Bilateral hilar lymph node metastases.   Diffuse chest and abdominal wall soft tissue metastases.  Pt resting in bed. She is A&O, calm and pleasant. Family at bedside. Pt advised that she will need to be NPO after [place] for procedure to have sedation.  She is not on thinning medications.  Procedure tentatively scheduled for 10/17/21  Risks and benefits of soft tissue nodule core biopsy was discussed with the patient and/or patient's family including, but not limited to bleeding, infection, damage to adjacent structures or low yield requiring additional tests.  All of the questions were answered and there is agreement to proceed.  Consent signed and in chart.   Thank you for this interesting consult.  I greatly enjoyed meeting Natalie Contreras and look forward to participating in their care.  A copy of this report was sent to the requesting provider on this date.  Electronically Signed: Tyson Alias, NP 10/16/2021, 12:52 PM   I spent a total of 20 minutes in face to face in clinical consultation, greater than 50% of which was counseling/coordinating care for soft tissue nodule core biopsy.

## 2021-10-16 NOTE — Anesthesia Postprocedure Evaluation (Signed)
Anesthesia Post Note  Patient: Natalie Contreras  Procedure(s) Performed: ESOPHAGOGASTRODUODENOSCOPY (EGD) WITH PROPOFOL BIOPSY     Patient location during evaluation: PACU Anesthesia Type: MAC Level of consciousness: awake and alert, patient cooperative and oriented Pain management: pain level controlled Vital Signs Assessment: post-procedure vital signs reviewed and stable Respiratory status: spontaneous breathing, nonlabored ventilation and respiratory function stable Cardiovascular status: stable and blood pressure returned to baseline Postop Assessment: no apparent nausea or vomiting and adequate PO intake Anesthetic complications: no   No notable events documented.  Last Vitals:  Vitals:   10/16/21 1135 10/16/21 1150  BP: (!) 120/47 (!) 152/51  Pulse: 70 73  Resp: 19 20  Temp: 36.7 C 36.7 C  SpO2: 95% 98%    Last Pain:  Vitals:   10/16/21 1150  TempSrc:   PainSc: 0-No pain                 Rakeb Kibble,E. Masaki Rothbauer

## 2021-10-16 NOTE — Progress Notes (Signed)
Subjective: Patient reports that she is doing well and has no complaints at the time of my visit. No acute events overnight.   Objective: Vital signs in last 24 hours: Temp:  [97.8 F (36.6 C)-98.4 F (36.9 C)] 98 F (36.7 C) (01/31 0550) Pulse Rate:  [72-76] 75 (01/31 0550) Resp:  [17-18] 17 (01/31 0550) BP: (136-162)/(45-83) 136/83 (01/31 0550) SpO2:  [94 %-95 %] 95 % (01/31 0550)  Intake/Output from previous day: 01/30 0701 - 01/31 0700 In: 1337.4 [P.O.:1000; I.V.:67.4; IV Piggyback:270] Out: 0  Intake/Output this shift: No intake/output data recorded.  Physical Exam: Patient is awake, A/O X 4, conversant, and in good spirits. NAD and VSS. Thought content appropriate.  Speech appropriate and fluent without evidence of aphasia. Follows commands without difficulty.  No dysarthria present. MAEW with good strength. RLE BKA. Sensation to light touch is intact. PERLA, EOMI. CNs grossly intact.  Lab Results: Recent Labs    10/15/21 0325 10/15/21 1433 10/16/21 0324  WBC 5.4  --  5.3  HGB 7.1* 7.3* 7.4*  HCT 23.9* 25.0* 24.4*  PLT 340  --  324   BMET Recent Labs    10/15/21 0325 10/16/21 0324  NA 141 140  K 4.0 3.8  CL 106 107  CO2 23 22  GLUCOSE 90 69*  BUN 60* 55*  CREATININE 1.75* 1.74*  CALCIUM 8.5* 8.6*    Studies/Results: MR BRAIN W WO CONTRAST  Result Date: 10/16/2021 CLINICAL DATA:  CNS neoplasm, [facility] protocol EXAM: MRI HEAD WITHOUT AND WITH CONTRAST TECHNIQUE: Multiplanar, multiecho pulse sequences of the brain and surrounding structures were obtained without and with intravenous contrast. CONTRAST:  42mL GADAVIST GADOBUTROL 1 MMOL/ML IV SOLN COMPARISON:  No prior MRI, correlation is made with 10/13/2021 CT head FINDINGS: Evaluation is limited by motion artifact. Brain: Multiple enhancing masses in the bilateral cerebral hemispheres, the largest of which is in the left parietooccipital lobe and measures up to 3.3 x 2.9 x 3.3 cm (AP x TR x CC) (series 9, image  92). Additional smaller lesions include a left parietal lesion that measures 0.7 x 0.5 x 0.7 cm (series 9, image 39 and series 10, image 29), a left frontal lobe lesion that measures 0.6 x 0.4 x 0.6 cm (series 9, image 139 and series 10, image 43), a right frontal lobe lesion that measures 0.3 x 0.4 x 0.4 cm (series 9, image 50 and series 10, image 52), a right extra-axial lesion along the falx adjacent to the right parietal lobe that measures 1.0 x 0.5 x 1.0 cm (series 9, image 60 and series 10, image 23), right parietal lesion that measures 1.0 x 0.9 x 1.0 cm (series 9, image 80 and series 10, image 20), a left parietal lesion that measures 0.4 x 0.8 x 0.5 cm (series 9, image 82 and series 10, image 22), a left occipital lesion that measures 0.5 x 0.8 x 0.6 cm (series 9, image 124 and series 10, image 8), and a right occipital lesion (or possibly 2 adjacent lesions) that measures 1.2 x 1.7 x 0.9 cm (series 9, image 128 and series 10, image 8). Possible additional lesions in the right frontal lobe adjacent to the right lateral ventricle, which is faintly visualized on the coronal postcontrast sequences (series 10, image 32), measuring 0.6 x 0.5 cm, and in the atrium of the right lateral ventricle (series 11, image 26), measuring approximately 0.4 cm, best seen on the sagittal sequence. These lesions are associated with surrounding T2 hyperintense signal, likely  edema, with the largest area of edema surrounding the dominant left parieto-occipital lesion. Several of these lesions are also associated with hemosiderin deposition (series 6, image 54, 76, 81, and 82). Two rounded areas of hemosiderin deposition in the left lateral ventricle atrium (series 6, image 76 and 69) are difficult to find definite enhancing lesions to correlate with on the axial sequences, given enhancement in the choroid plexus, but possible correlates are suspected on the sagittal postcontrast sequence (series 11, image 24 and 26). No other  areas of parenchymal hemorrhage. No definite midline shift. 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 No hydrocephalus or extra-axial collection. No diffusion-weighted sequence was available to evaluate for infarct given the selected protocol. Vascular: The left PCA flow void is not well visualized, however the vessel is seen on postcontrast imaging. Otherwise normal flow voids. Skull and upper cervical spine: Normal marrow signal. Sinuses/Orbits: Status post bilateral lens replacements. Otherwise negative. Other: Trace fluid in the mastoid air cells. IMPRESSION: Evaluation is limited by motion artifact. Within this limitation, multiple enhancing lesions in the bilateral cerebral hemispheres, with additional extra-axial lesion along the right parietal lobe and possible lesions in the atrium of the left lateral ventricle. Several of these lesions demonstrate associated edema and intralesional hemorrhage, including the dominant left parieto-occipital lesion. These are overall concerning for metastatic disease. Additional lesions may be present, but obscured by the motion artifact. Electronically Signed   By: Merilyn Baba M.D.   On: 10/16/2021 02:05    Assessment/Plan: 56 year old female with approximately 1 month history of generalized weakness and malaise.  CT head revealed multiple masses with the largest one being a 2.7 cm and located in the left parieto-occipital lobe with an adjacent vasogenic edema that is concerning for hemorrhagic metastasis.  CT chest, abdomen, and pelvis revealed diffuse metastasis. The patient's neurological exam remains unchanged and at her baseline. MRI brain reviewed. There are multiple enhancing lesions in the bilateral cerebral hemispheres, with additional extra-axial lesion along the right parietal lobe and possible lesions in the atrium of the left lateral ventricle. Many of the lesions have associated edema and intralesional hemorrhage, including  the dominant left parieto-occipital lesion initially noted on CT. 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 IR planning soft tissue nodule core biopsy to confirm suspected RCC.   Dr. Marcello Moores (neurosurgery) and Dr. Isidore Moos (radiation oncology) reviewed the patient's case and imaging. They determined that due to the overall burden of her disease including CNS disease, they recommend no surgical intervention at this time and recommend proceeding with possible SRS versus whole brain radiation.      LOS: 3 days     Marvis Moeller, DNP, NP-C 10/16/2021, 8:34 AM

## 2021-10-16 NOTE — Op Note (Signed)
[facility] Patient Name: Natalie Contreras Procedure Date : 10/16/2021 MRN: [id number] Attending MD: Georgian Co ,  Date of Birth: October 29, 1965 CSN: [id number] Age: 56 Admit Type: Outpatient Procedure:                Upper GI endoscopy Indications:              Iron deficiency anemia, Melena Providers:                Adline Mango" Carmin Richmond, RN,                            Benetta Spar, Technician Referring MD:             Hospitalist team Medicines:                Monitored Anesthesia Care Complications:            No immediate complications. Estimated Blood Loss:     Estimated blood loss was minimal. Procedure:                Pre-Anesthesia Assessment:                           - Prior to the procedure, a History and Physical                            was performed, and patient medications and                            allergies were reviewed. The patient's tolerance of                            previous anesthesia was also reviewed. The risks                            and benefits of the procedure and the sedation                            options and risks were discussed with the patient.                            All questions were answered, and informed consent                            was obtained. Prior Anticoagulants: The patient has                            taken no previous anticoagulant or antiplatelet                            agents. ASA Grade Assessment: II - A patient with                            mild systemic disease. After reviewing the risks  and benefits, the patient was deemed in                            satisfactory condition to undergo the procedure.                           After obtaining informed consent, the endoscope was                            passed under direct vision. Throughout the                            procedure, the patient's blood pressure, pulse, and                             oxygen saturations were monitored continuously. The                            GIF-H190 ([id number]) Olympus endoscope was introduced                            through the mouth, and advanced to the second part                            of duodenum. The upper GI endoscopy was                            accomplished without difficulty. The patient                            tolerated the procedure well. Scope In: Scope Out: Findings:      The examined esophagus was normal.      Two oozing cratered fungating gastric ulcers were found in the gastric       body, concerning for malignancy. The largest lesion was 12 mm in largest       dimension. Biopsies were taken with a cold forceps for histology.      Localized erythematous mucosa without bleeding was found in the gastric       antrum. Biopsies were taken with a cold forceps for Helicobacter pylori       testing.      The examined duodenum was normal. Impression:               - Normal esophagus.                           - Oozing fungating cratered gastric ulcers,                            concerning for malignancy. Biopsied.                           - Erythematous mucosa in the antrum. Biopsied.                           - Normal examined duodenum. Recommendation:           -  Return patient to hospital ward for ongoing care.                           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 No endoscopic therapy                            is available to durably treat bleeding bleeding due                            to malignancy. Recommend discussion with radiation                            oncology versus IR if patient continues to have GI                            bleeding issues.                           - Await pathology results.                           - Use a proton pump inhibitor PO BID indefinitely.                           - Use  sucralfate suspension 1 gram PO QID for 2                            weeks.                           - No aspirin, ibuprofen, naproxen, or other                            non-steroidal anti-inflammatory drugs.                           - The findings and recommendations were discussed                            with the patient and/or primary team. Procedure Code(s):        --- Professional ---                           [phone], Esophagogastroduodenoscopy, flexible,                            transoral; with biopsy, single or multiple Diagnosis Code(s):        --- Professional ---                           K25.4, Chronic or unspecified gastric ulcer with  hemorrhage                           K31.89, Other diseases of stomach and duodenum                           D50.9, Iron deficiency anemia, unspecified                           K92.1, Melena (includes Hematochezia) CPT copyright 2019 [employer]. All rights reserved. The codes documented in this report are preliminary and upon coder review may  be revised to meet current compliance requirements. Sonny Masters "Christia Reading,  10/16/2021 11:37:41 AM Number of Addenda: 0

## 2021-10-16 NOTE — Anesthesia Preprocedure Evaluation (Signed)
Anesthesia Evaluation  Patient identified by MRN, date of birth, ID band Patient awake    Reviewed: Allergy & Precautions, NPO status , Patient's Chart, lab work & pertinent test results, reviewed documented beta blocker date and time   History of Anesthesia Complications Negative for: history of anesthetic complications  Airway Mallampati: II  TM Distance: >3 FB Neck ROM: Full    Dental  (+) Poor Dentition, Loose, Dental Advisory Given, Missing   Pulmonary Current Smoker and Patient abstained from smoking.,    breath sounds clear to auscultation       Cardiovascular hypertension, Pt. on medications and Pt. on home beta blockers pulmonary hypertension(-) angina+ Peripheral Vascular Disease and +CHF   Rhythm:Regular Rate:Normal  '18 ECHO: EF 55- 60%. Wall motion was normal, no regional wall motion abnormalities. 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), Insulin Dependent, Oral Hypoglycemic AgentsMorbid obesity  Renal/GU Renal InsufficiencyRenal disease     Musculoskeletal   Abdominal (+) + obese,   Peds  Hematology  (+) Blood dyscrasia (Hb 7.4), anemia ,   Anesthesia Other Findings   Reproductive/Obstetrics                             Anesthesia Physical Anesthesia Plan  ASA: 3  Anesthesia Plan: MAC   Post-op Pain Management: Minimal or no pain anticipated   Induction:   PONV Risk Score and Plan: 1 and Treatment may vary due to age or medical condition  Airway Management Planned: Natural Airway and Nasal Cannula  Additional Equipment: None  Intra-op Plan:   Post-operative Plan:   Informed Consent: I have reviewed the patients History and Physical, chart, labs and discussed the procedure including the risks, benefits and alternatives for the proposed anesthesia with the patient or  authorized representative who has indicated his/her understanding and acceptance.     Dental advisory given  Plan Discussed with: CRNA and Surgeon  Anesthesia Plan Comments:         Anesthesia Quick Evaluation

## 2021-10-17 ENCOUNTER — Other Ambulatory Visit: Payer: Self-pay | Admitting: Radiation Therapy

## 2021-10-17 ENCOUNTER — Inpatient Hospital Stay (HOSPITAL_COMMUNITY): Payer: Medicaid Other

## 2021-10-17 ENCOUNTER — Ambulatory Visit
Admit: 2021-10-17 | Discharge: 2021-10-17 | Disposition: A | Discharge status: Home / Self Care | Payer: Medicaid Other | Attending: Radiation Oncology | Admitting: Radiation Oncology

## 2021-10-17 ENCOUNTER — Encounter: Payer: Self-pay | Admitting: Radiation Oncology

## 2021-10-17 DIAGNOSIS — D649 Anemia, unspecified: Secondary | ICD-10-CM | POA: Diagnosis not present

## 2021-10-17 DIAGNOSIS — C7931 Secondary malignant neoplasm of brain: Secondary | ICD-10-CM

## 2021-10-17 LAB — RENAL FUNCTION PANEL
Albumin: 2.7 g/dL — ABNORMAL LOW (ref 3.5–5.0)
Anion gap: 13 (ref 5–15)
BUN: 49 mg/dL — ABNORMAL HIGH (ref 6–20)
CO2: 20 mmol/L — ABNORMAL LOW (ref 22–32)
Calcium: 8.6 mg/dL — ABNORMAL LOW (ref 8.9–10.3)
Chloride: 106 mmol/L (ref 98–111)
Creatinine, Ser: 1.67 mg/dL — ABNORMAL HIGH (ref 0.44–1.00)
GFR, Estimated: 36 mL/min — ABNORMAL LOW (ref >60)
Glucose, Bld: 77 mg/dL (ref 70–99)
Phosphorus: 3 mg/dL (ref 2.5–4.6)
Potassium: 3.9 mmol/L (ref 3.5–5.1)
Sodium: 139 mmol/L (ref 135–145)

## 2021-10-17 LAB — CBC
HCT: 26.6 % — ABNORMAL LOW (ref 36.0–46.0)
Hemoglobin: 7.7 g/dL — ABNORMAL LOW (ref 12.0–15.0)
MCH: 23.5 pg — ABNORMAL LOW (ref 26.0–34.0)
MCHC: 28.9 g/dL — ABNORMAL LOW (ref 30.0–36.0)
MCV: 81.3 fL (ref 80.0–100.0)
Platelets: 247 10*3/uL (ref 150–400)
RBC: 3.27 MIL/uL — ABNORMAL LOW (ref 3.87–5.11)
RDW: 18.4 % — ABNORMAL HIGH (ref 11.5–15.5)
WBC: 5.1 10*3/uL (ref 4.0–10.5)
nRBC: 0 % (ref 0.0–0.2)

## 2021-10-17 LAB — PREGNANCY, URINE: Preg Test, Ur: NEGATIVE

## 2021-10-17 LAB — GLUCOSE, CAPILLARY
Glucose-Capillary: 122 mg/dL — ABNORMAL HIGH (ref 70–99)
Glucose-Capillary: 133 mg/dL — ABNORMAL HIGH (ref 70–99)
Glucose-Capillary: 155 mg/dL — ABNORMAL HIGH (ref 70–99)
Glucose-Capillary: 155 mg/dL — ABNORMAL HIGH (ref 70–99)
Glucose-Capillary: 162 mg/dL — ABNORMAL HIGH (ref 70–99)
Glucose-Capillary: 57 mg/dL — ABNORMAL LOW (ref 70–99)
Glucose-Capillary: 66 mg/dL — ABNORMAL LOW (ref 70–99)
Glucose-Capillary: 68 mg/dL — ABNORMAL LOW (ref 70–99)
Glucose-Capillary: 75 mg/dL (ref 70–99)
Glucose-Capillary: 82 mg/dL (ref 70–99)
Glucose-Capillary: 91 mg/dL (ref 70–99)

## 2021-10-17 LAB — MAGNESIUM: Magnesium: 2 mg/dL (ref 1.7–2.4)

## 2021-10-17 IMAGING — US IR BIOPSY CORE MUSCLE/SOFT TISSUE
1 series · 13 of 21 positions shown · non-contrast
Comparison: none

INDICATION: 55-year-old female with history of left renal mass and multiple
diffuse metastatic lesions.

[Series 1: us core biopsy (soft tissue) · 13 of 21 slices shown]
[im 1/21]
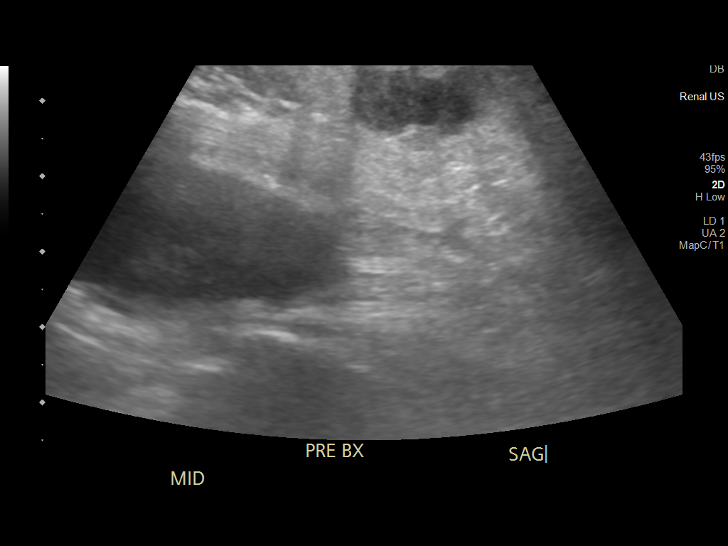
[im 3/21]
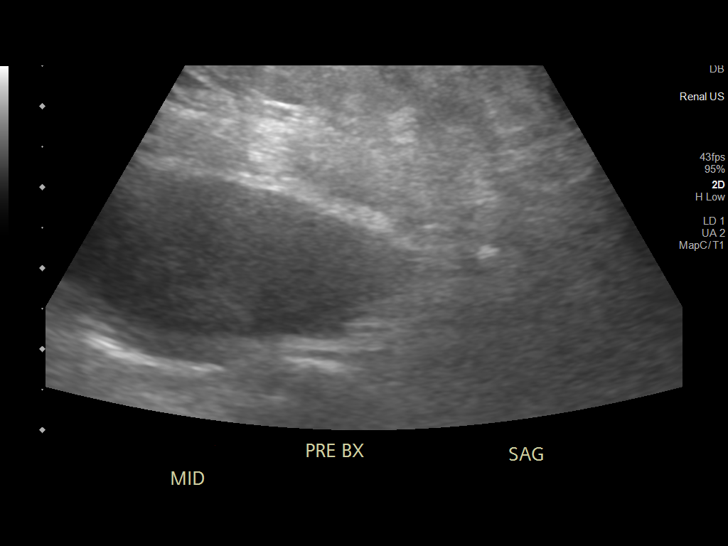
[im 5/21]
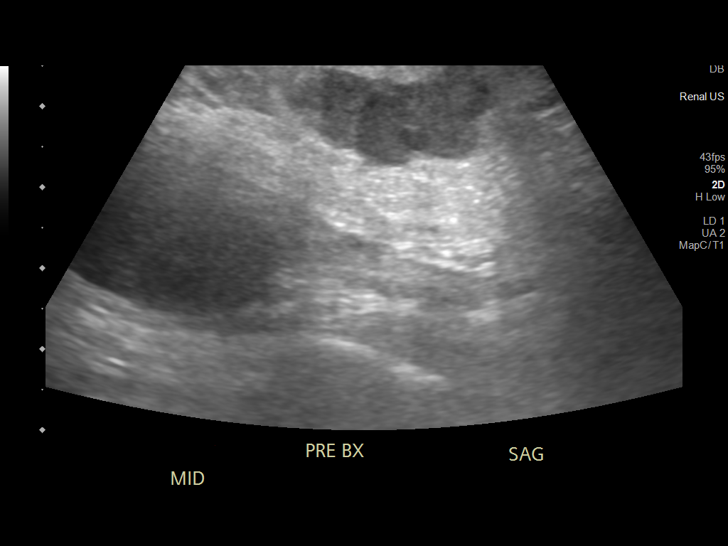
[im 6/21]
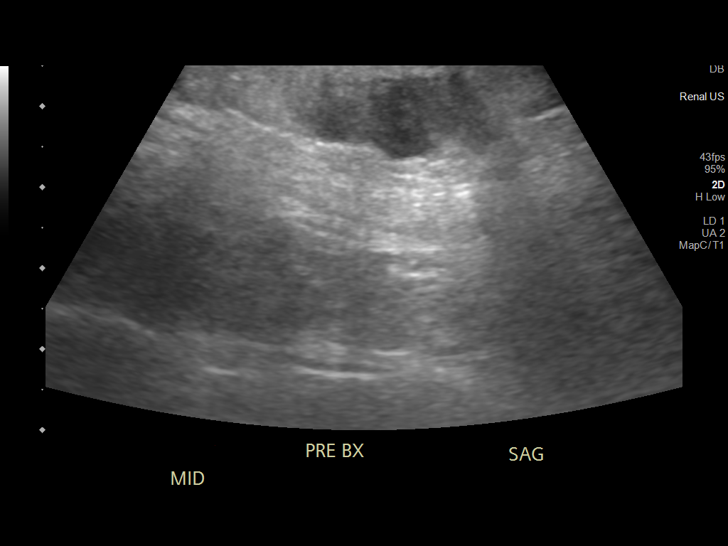
[im 8/21]
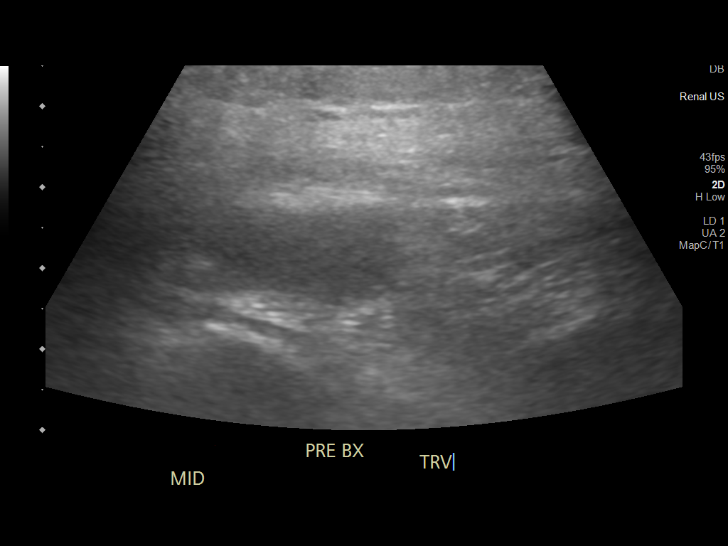
[im 9/21]
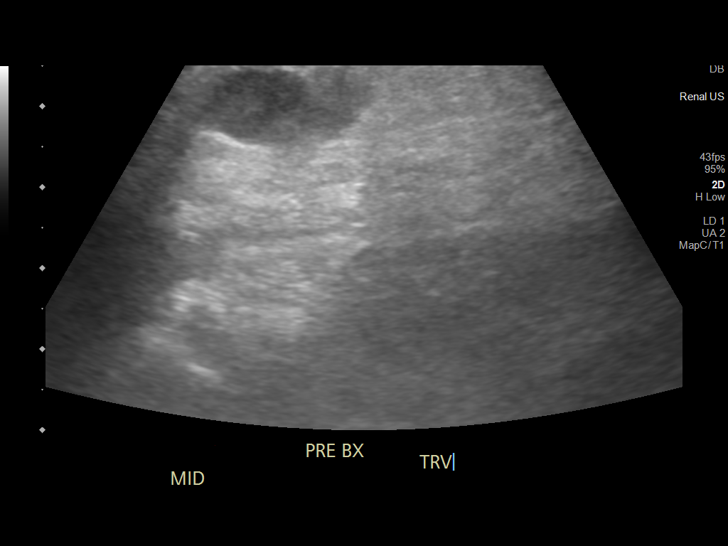
[im 11/21]
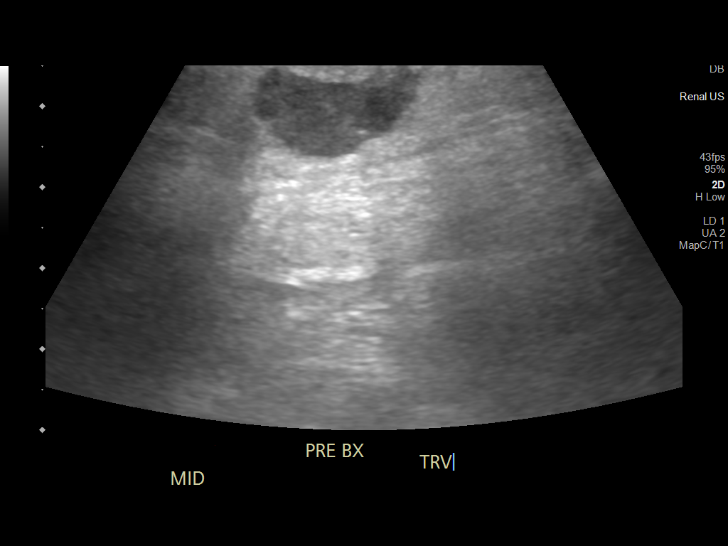
[im 13/21]
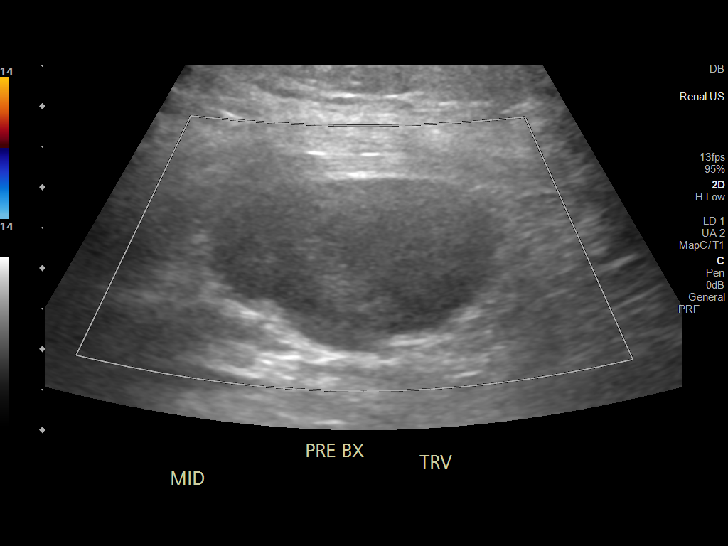
[im 14/21]
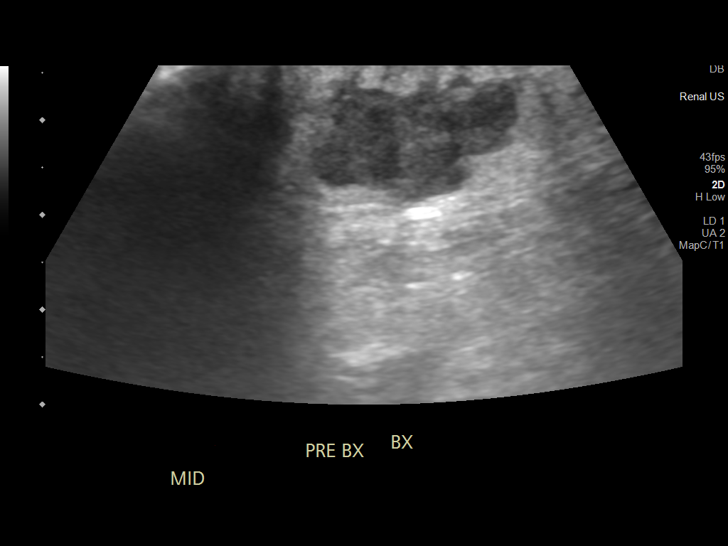
[im 16/21]
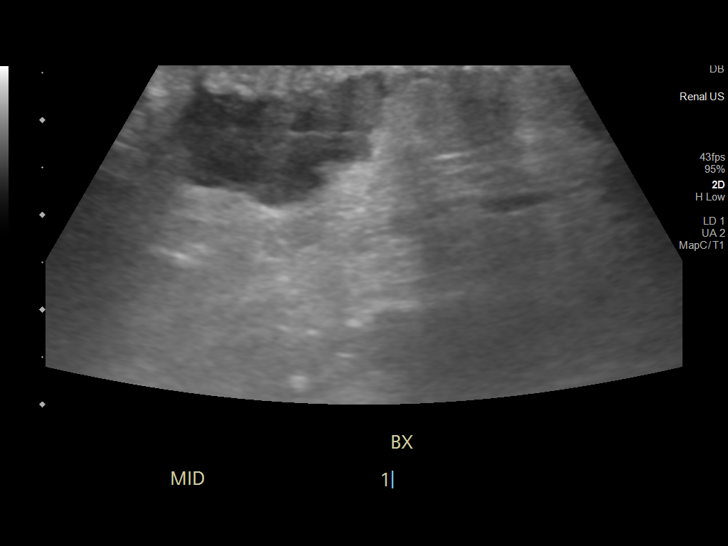
[im 17/21]
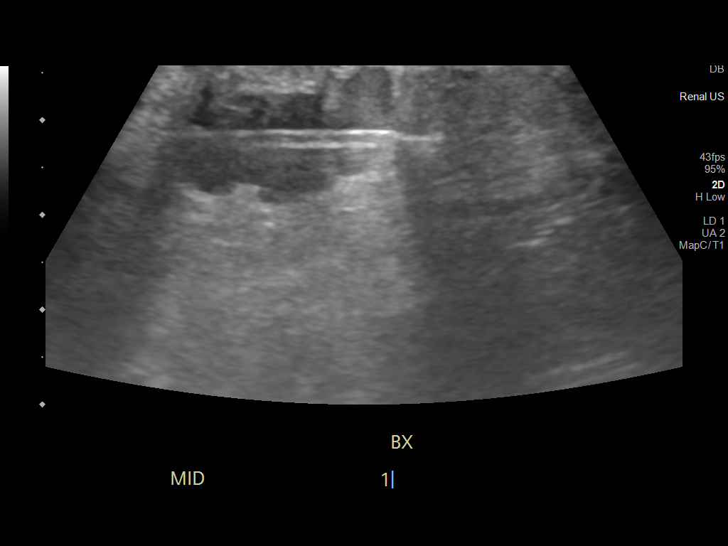
[im 19/21]
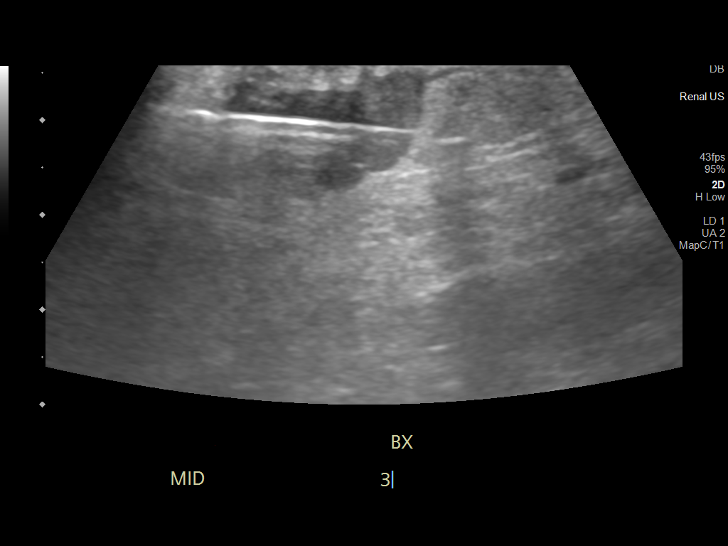
[im 21/21]
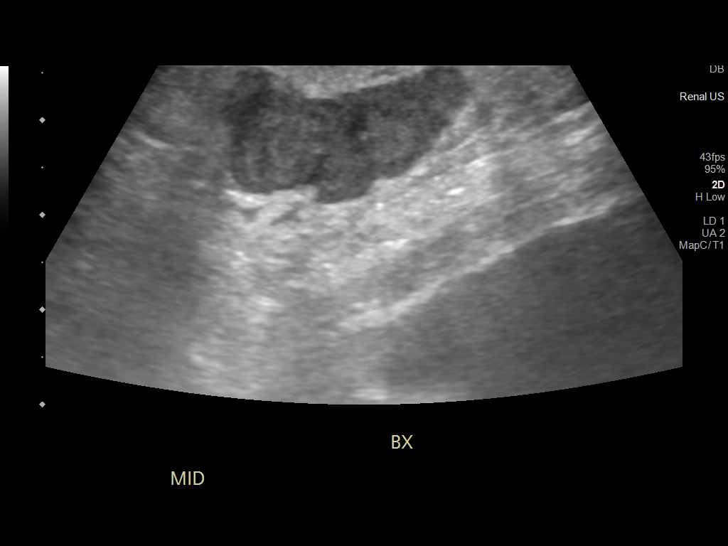

[13 of 21 positions shown; findings below may reference images not displayed]

EXAM:
Ultrasound-guided soft tissue mass biopsy

MEDICATIONS:
None.

ANESTHESIA/SEDATION:
Moderate (conscious) sedation was employed during this procedure. A
total of Versed 1 mg and Fentanyl 25 mcg was administered
intravenously.

Moderate Sedation Time: 8 minutes. The patient's level of
consciousness and vital signs were monitored continuously by
radiology nursing throughout the procedure under my direct
supervision.

FLUOROSCOPY TIME:  None.

COMPLICATIONS:
None immediate.

PROCEDURE:
Informed written consent was obtained from the patient after a
thorough discussion of the procedural risks, benefits and
alternatives. All questions were addressed. Maximal Sterile Barrier
Technique was utilized including caps, mask, sterile gowns, sterile
gloves, sterile drape, hand hygiene and skin antiseptic. A timeout
was performed prior to the initiation of the procedure.

Preprocedure ultrasound evaluation of the right posterior flank
demonstrates subcutaneous hypoechoic soft tissue nodule measuring
approximately 2.0 x 1.0 cm. Procedure was planned. Subdermal Local
anesthesia was provided at the planned needle entry site. Deeper
local anesthetic was administered under ultrasound guidance to the
periphery of the targeted mass. A small skin nick was made. Under
direct ultrasound visualization, a 17 gauge introducer needle was
directed to the periphery of the mass. Next, a total of 3, 18 gauge
core biopsies were obtained and placed in formalin. The introducer
needle was removed. Postprocedure image demonstrated no evidence of
surrounding hematoma or other procedure related complication. The
patient tolerated the procedure well and was transferred back to the
floor in stable condition.
IMPRESSION: Technically successful ultrasound-guided core needle biopsy of right
posterior flank subcutaneous soft tissue mass.

## 2021-10-17 MED ORDER — DEXTROSE 50 % IV SOLN
INTRAVENOUS | Status: AC
  Administered 2021-10-17: 25 mL
  Filled 2021-10-17: qty 50

## 2021-10-17 MED ORDER — DEXTROSE 50 % IV SOLN
12.5000 g | INTRAVENOUS | Status: AC
  Administered 2021-10-17: 12.5 g via INTRAVENOUS

## 2021-10-17 MED ORDER — FENTANYL CITRATE (PF) 100 MCG/2ML IJ SOLN
INTRAMUSCULAR | Status: AC | PRN
  Administered 2021-10-17: 25 ug via INTRAVENOUS

## 2021-10-17 MED ORDER — LIDOCAINE HCL (PF) 1 % IJ SOLN
INTRAMUSCULAR | Status: AC
  Filled 2021-10-17: qty 30

## 2021-10-17 MED ORDER — INSULIN DETEMIR 100 UNIT/ML ~~LOC~~ SOLN
10.0000 [IU] | Freq: Every day | SUBCUTANEOUS | Status: DC
  Filled 2021-10-17: qty 0.1

## 2021-10-17 MED ORDER — FENTANYL CITRATE (PF) 100 MCG/2ML IJ SOLN
INTRAMUSCULAR | Status: AC
  Filled 2021-10-17: qty 2

## 2021-10-17 MED ORDER — SODIUM CHLORIDE 0.9 % IV SOLN
510.0000 mg | Freq: Once | INTRAVENOUS | Status: AC
  Administered 2021-10-17: 510 mg via INTRAVENOUS
  Filled 2021-10-17: qty 17

## 2021-10-17 MED ORDER — MIDAZOLAM HCL 2 MG/2ML IJ SOLN
INTRAMUSCULAR | Status: AC | PRN
  Administered 2021-10-17: 1 mg via INTRAVENOUS

## 2021-10-17 MED ORDER — DEXTROSE 50 % IV SOLN: 12.5000 g | INTRAVENOUS | Status: AC

## 2021-10-17 MED ORDER — MIDAZOLAM HCL 2 MG/2ML IJ SOLN
INTRAMUSCULAR | Status: AC
  Filled 2021-10-17: qty 2

## 2021-10-17 NOTE — Progress Notes (Signed)
Hypoglycemic Event  CBG:   Latest Reference Range & Units 10/17/21 00:00  Glucose-Capillary 70 - 99 mg/dL 68 (L)  (L): Data is abnormally low Treatment: D50 25 mL (12.5 gm)  Symptoms: Pale  Follow-up CBG: Time:  CBG Result:   Latest Reference Range & Units 10/17/21 00:22  Glucose-Capillary 70 - 99 mg/dL 162 (H)  (H): Data is abnormally high Possible Reasons for Event: Medication regimen:    Comments/MD notified:   Natalie Contreras

## 2021-10-17 NOTE — Procedures (Signed)
Interventional Radiology Procedure Note  Procedure: Ultrasound guided soft tissue mass biopsy  Findings: Please refer to procedural dictation for full description. Left posterior subcutaneous soft tissue mass 18 ga core x 3.  Complications: None immediate  Estimated Blood Loss: < 5 mL  Recommendations: None specific.   Ruthann Cancer, MD Pager: [phone]

## 2021-10-17 NOTE — Progress Notes (Signed)
Physical Therapy Treatment Patient Details Name: Natalie Contreras MRN: [id number] DOB: 04/01/1966 Today's Date: 10/17/2021   History of Present Illness Pt is a 56 yr old female who presented due to feeling weak over the last several months. CT showed multiple nodular opacities, concerning for malignancy and metastases. with intracranial masses as well, Neurosurgery and Oncology on board;  Pt HTN, DM, CHF, PAD, R BKA    PT Comments    Continuing work on functional mobility and activity tolerance;  session focused on practicing and discussing options for stair training; Natalie Contreras was quite fatigued, and became a bit overwhelmed and anxious as we practiced the first step and technique; Discussed and demonstrated other ways to negotiate stairs, and we made a plan to practice with her daughter tomorrow;   [name]'s plan is to go home and stay with her daughter, and while that is a solid plan for assistance as needed, her daughter's home is less accessible (with 11 steps to enter as opposed to her own home with a level entry); With Outpt procedures like Radiation Oncology, she might be quite fatigued after, making those stairs even harder; While getting a stair lift can be a Brewing technologist, it might become necessary in the long run to get into her daughter's house;   At this point, it is worth considering the patient staying at her own apartment, where she manages well, with support as needed from family;   Will insurance cover a personal care attendant, who can also help with rides to appointments (and stairs), etc?   Recommendations for follow up therapy are one component of a multi-disciplinary discharge planning process, led by the attending physician.  Recommendations may be updated based on patient status, additional functional criteria and insurance authorization.  Follow Up Recommendations   (Pt's insurance doesn't cover HHPT; Rec Outpt PT -- may have difficulty with scheduling rides)      Assistance Recommended at Discharge Set up Supervision/Assistance  Patient can return home with the following Help with stairs or ramp for entrance   Equipment Recommendations  Other (comment) Sales executive chair for stair negotiation)    Recommendations for Other Services  I wonder if pt can get a Personal Care Attendant     Precautions / Restrictions Precautions Precautions: Fall (pt reported sometimes bowel incontience)     Mobility  Bed Mobility                    Transfers Overall transfer level: Needs assistance Equipment used: Rolling walker (2 wheels) Transfers: Sit to/from Stand Sit to Stand: Min guard           General transfer comment: increase time; heavy dependence on forward trunk flexion to initiate rise, then pushes up to fully upright with UEs on RW    Ambulation/Gait Ambulation/Gait assistance: Supervision Gait Distance (Feet): 20 Feet Assistive device: Rolling walker (2 wheels) Gait Pattern/deviations: Step-through pattern, Decreased step length - right, Decreased step length - left Gait velocity: slowed     General Gait Details: Manages well with RW and prosthesis; reports incr fatigue today, especially when compared to yesterday   Stairs Stairs: Yes Stairs assistance: Min assist Stair Management: One rail Right (using "stand from shower chair" technqiue) Number of Stairs: 1 General stair comments: Pt became anxious when as she began practicing steps, ultimately the technqiue using a shower chair as an interim seat on the steps isn't for her; Discussed and demonstrated more options for stairs including sideways step-to with rail, forwards with  rail and daughter support, and bumping up steps; Pt tells me she would like to try sideways technique, but too fatigued to try today   Wheelchair Mobility    Modified Rankin (Stroke Patients Only)       Balance     Sitting balance-Leahy Scale: Good       Standing balance-Leahy Scale: Fair                               Cognition Arousal/Alertness: Awake/alert Behavior During Therapy: WFL for tasks assessed/performed Overall Cognitive Status: Within Functional Limits for tasks assessed                                 General Comments: Became mildly anxious as we discussed and practiced stairs in the stairwell        Exercises      General Comments        Pertinent Vitals/Pain Pain Assessment Pain Assessment: No/denies pain    Home Living                          Prior Function            PT Goals (current goals can now be found in the care plan section) Acute Rehab PT Goals Patient Stated Goal: Be able to get up the stairs into her duaghter's home PT Goal Formulation: With patient/family Time For Goal Achievement: 10/30/21 Potential to Achieve Goals: Good Progress towards PT goals: Progressing toward goals (Slowly -- quite fatigued today)    Frequency    Min 3X/week      PT Plan Current plan remains appropriate    Co-evaluation              AM-PAC PT "6 Clicks" Mobility   Outcome Measure  Help needed turning from your back to your side while in a flat bed without using bedrails?: None Help needed moving from lying on your back to sitting on the side of a flat bed without using bedrails?: None Help needed moving to and from a bed to a chair (including a wheelchair)?: A Little Help needed standing up from a chair using your arms (e.g., wheelchair or bedside chair)?: A Little Help needed to walk in hospital room?: A Little Help needed climbing 3-5 steps with a railing? : A Lot 6 Click Score: 19    End of Session Equipment Utilized During Treatment: Gait belt Activity Tolerance: Patient tolerated treatment well Patient left: in chair;with call bell/phone within reach;with family/visitor present Nurse Communication: Mobility status PT Visit Diagnosis: Other abnormalities of gait and mobility  (R26.89);Muscle weakness (generalized) (M62.81)     Time: 4128-7867 PT Time Calculation (min) (ACUTE ONLY): 53 min  Charges:  $Gait Training: 23-37 mins $Therapeutic Activity: 8-22 mins $Self Care/Home Management: [place], PT  Acute Rehabilitation Services Pager [phone] Office [phone]    Colletta [place] 10/17/2021, 4:06 PM

## 2021-10-17 NOTE — Progress Notes (Signed)
PROGRESS NOTE    Natalie Contreras  KYH:[id number] DOB: Jul 12, 1966 DOA: 10/13/2021 PCP: Celene Squibb, MD    Brief Narrative:  56 year old F with PMH of DM-2 with neuropathy, diastolic CHF, HTN, PAD, right BKA and tobacco use disorder presenting to [facility] with generalized weakness for 2 months, melena and enlarging tender nodes on head and neck, chest and back, and admitted for symptomatic anemia and 10 cm left renal mass with invasion of the left posterior abdominal wall soft tissue concerning for RCC with widespread metastasis including 2.7 cm peripheral hyperdense masslike area in the left parietal occipital lobe with vasogenic edema and concern for hemorrhagic metastasis.  Hgb 6.2.  Hemoccult positive.  Transfused 2 units.  Hgb improved to 7.4 and remained stable.  GI, neurosurgery, urology and oncology consulted.  No plan for urologic intervention.  Neurosurgery ordered MRI brain that showed multiple enhancing lesions with edema and intralesional hemorrhage concerning for metastatic disease.  Patient has no focal neurologic symptoms or headache.   Patient underwent EGD that showed oozing fungating cratered gastric ulcers, concerning for malignancy that has been biopsied.  GI recommended discussion with radiation oncology versus IR if patient continues to have GI bleeding issues, PPI twice daily indefinitely and Carafate 1 g p.o. 4 times daily for 2 weeks.   IR consulted for tissue biopsy to confirm suspected RCC.  2/1  couple of episodes of hypoglycemia today . Was given OJ per nsg.   Consultants:  Oncology, IR, GI, urology,  Procedures:   Antimicrobials:      Subjective: Has no pain from bx site. No complaints  Objective: Vitals:   10/17/21 0920 10/17/21 0925 10/17/21 0930 10/17/21 0940  BP: 135/73 (!) 130/50 (!) 121/52 137/62  Pulse: 70 68 68 68  Resp: 19 18 16 14   Temp:      TempSrc:      SpO2: 100% 100% 96% 95%  Weight:      Height:        Intake/Output Summary (Last  24 hours) at 10/17/2021 1609 Last data filed at 10/17/2021 1300 Gross per 24 hour  Intake 420 ml  Output 0 ml  Net 420 ml   Filed Weights   10/13/21 1331 10/13/21 2145  Weight: 93 kg 90.1 kg    Examination:  General exam: Appears calm and comfortable  Respiratory system: Clear to auscultation. Respiratory effort normal. Cardiovascular system: S1 & S2 heard, RRR. No gallops  Gastrointestinal system: Abdomen is nondistended, soft and nontender. . Normal bowel sounds heard. Central nervous system: Alert and oriented.  Extremities: Right prosthetics in place. Psychiatry: Judgement and insight appear normal. Mood & affect appropriate.     Data Reviewed: I have personally reviewed following labs and imaging studies  CBC: Recent Labs  Lab 10/13/21 1428 10/14/21 0221 10/15/21 0325 10/15/21 1433 10/16/21 0324 10/17/21 0334  WBC 5.6 5.1 5.4  --  5.3 5.1  NEUTROABS 3.7  --   --   --   --   --   HGB 6.2* 7.4* 7.1* 7.3* 7.4* 7.7*  HCT 21.5* 24.3* 23.9* 25.0* 24.4* 26.6*  MCV 79.9* 79.4* 80.5  --  81.1 81.3  PLT 388 338 340  --  324 151   Basic Metabolic Panel: Recent Labs  Lab 10/13/21 1428 10/14/21 0221 10/15/21 0325 10/16/21 0324 10/17/21 0334  NA 138 140 141 140 139  K 3.1* 3.4* 4.0 3.8 3.9  CL 101 106 106 107 106  CO2 25 22 23 22  20*  GLUCOSE  113* 104* 90 69* 77  BUN 70* 67* 60* 55* 49*  CREATININE 1.78* 1.80* 1.75* 1.74* 1.67*  CALCIUM 8.8* 8.3* 8.5* 8.6* 8.6*  MG  --   --  2.0 2.0 2.0  PHOS  --   --  3.4 2.8 3.0   GFR: Estimated Creatinine Clearance: 41.9 mL/min (A) (by C-G formula based on SCr of 1.67 mg/dL (H)). Liver Function Tests: Recent Labs  Lab 10/13/21 1428 10/15/21 0325 10/16/21 0324 10/17/21 0334  AST 11*  --  12*  --   ALT 11  --  11  --   ALKPHOS 107  --  99  --   BILITOT 0.6  --  0.5  --   PROT 6.5  --  5.7*  --   ALBUMIN 3.0* 2.5* 2.5* 2.7*   No results for input(s): LIPASE, AMYLASE in the last 168 hours. No results for input(s):  AMMONIA in the last 168 hours. Coagulation Profile: Recent Labs  Lab 10/14/21 1255  INR 1.1   Cardiac Enzymes: No results for input(s): CKTOTAL, CKMB, CKMBINDEX, TROPONINI in the last 168 hours. BNP (last 3 results) No results for input(s): PROBNP in the last 8760 hours. HbA1C: No results for input(s): HGBA1C in the last 72 hours. CBG: Recent Labs  Lab 10/17/21 0657 10/17/21 0719 10/17/21 1111 10/17/21 1326 10/17/21 1603  GLUCAP 57* 155* 66* 122* 155*   Lipid Profile: No results for input(s): CHOL, HDL, LDLCALC, TRIG, CHOLHDL, LDLDIRECT in the last 72 hours. Thyroid Function Tests: No results for input(s): TSH, T4TOTAL, FREET4, T3FREE, THYROIDAB in the last 72 hours. Anemia Panel: Recent Labs    10/15/21 0325  FERRITIN 71  TIBC 315  IRON 16*   Sepsis Labs: No results for input(s): PROCALCITON, LATICACIDVEN in the last 168 hours.  Recent Results (from the past 240 hour(s))  Resp Panel by RT-PCR (Flu A&B, Covid) Nasopharyngeal Swab     Status: None   Collection Time: 10/13/21  2:35 PM   Specimen: Nasopharyngeal Swab; Nasopharyngeal(NP) swabs in vial transport medium  Result Value Ref Range Status   SARS Coronavirus 2 by RT PCR NEGATIVE NEGATIVE Final    Comment: (NOTE) SARS-CoV-2 target nucleic acids are NOT DETECTED.  The SARS-CoV-2 RNA is generally detectable in upper respiratory specimens during the acute phase of infection. The lowest concentration of SARS-CoV-2 viral copies this assay can detect is 138 copies/mL. A negative result does not preclude SARS-Cov-2 infection and should not be used as the sole basis for treatment or other patient management decisions. A negative result may occur with  improper specimen collection/handling, submission of specimen other than nasopharyngeal swab, presence of viral mutation(s) within the areas targeted by this assay, and inadequate number of viral copies(<138 copies/mL). A negative result must be combined  with clinical observations, patient history, and epidemiological information. The expected result is Negative.  Fact Sheet for Patients:  EntrepreneurPulse.com.au  Fact Sheet for Healthcare Providers:  IncredibleEmployment.be  This test is no t yet approved or cleared by the [place] FDA and  has been authorized for detection and/or diagnosis of SARS-CoV-2 by FDA under an Emergency Use Authorization (EUA). This EUA will remain  in effect (meaning this test can be used) for the duration of the COVID-19 declaration under Section 564(b)(1) of the Act, 21 U.S.C.section 360bbb-3(b)(1), unless the authorization is terminated  or revoked sooner.       Influenza A by PCR NEGATIVE NEGATIVE Final   Influenza B by PCR NEGATIVE NEGATIVE Final  Comment: (NOTE) The Xpert Xpress SARS-CoV-2/FLU/RSV plus assay is intended as an aid in the diagnosis of influenza from Nasopharyngeal swab specimens and should not be used as a sole basis for treatment. Nasal washings and aspirates are unacceptable for Xpert Xpress SARS-CoV-2/FLU/RSV testing.  Fact Sheet for Patients: EntrepreneurPulse.com.au  Fact Sheet for Healthcare Providers: IncredibleEmployment.be  This test is not yet approved or cleared by the [place] FDA and has been authorized for detection and/or diagnosis of SARS-CoV-2 by FDA under an Emergency Use Authorization (EUA). This EUA will remain in effect (meaning this test can be used) for the duration of the COVID-19 declaration under Section 564(b)(1) of the Act, 21 U.S.C. section 360bbb-3(b)(1), unless the authorization is terminated or revoked.  Performed at [facility], [address] [place], [place] [address]          Radiology Studies: MR BRAIN W WO CONTRAST  Result Date: 10/16/2021 CLINICAL DATA:  CNS neoplasm, BrainLAB protocol EXAM: MRI HEAD WITHOUT AND WITH CONTRAST TECHNIQUE:  Multiplanar, multiecho pulse sequences of the brain and surrounding structures were obtained without and with intravenous contrast. CONTRAST:  1mL GADAVIST GADOBUTROL 1 MMOL/ML IV SOLN COMPARISON:  No prior MRI, correlation is made with 10/13/2021 CT head FINDINGS: Evaluation is limited by motion artifact. Brain: Multiple enhancing masses in the bilateral cerebral hemispheres, the largest of which is in the left parietooccipital lobe and measures up to 3.3 x 2.9 x 3.3 cm (AP x TR x CC) (series 9, image 92). Additional smaller lesions include a left parietal lesion that measures 0.7 x 0.5 x 0.7 cm (series 9, image 39 and series 10, image 29), a left frontal lobe lesion that measures 0.6 x 0.4 x 0.6 cm (series 9, image 139 and series 10, image 43), a right frontal lobe lesion that measures 0.3 x 0.4 x 0.4 cm (series 9, image 50 and series 10, image 52), a right extra-axial lesion along the falx adjacent to the right parietal lobe that measures 1.0 x 0.5 x 1.0 cm (series 9, image 60 and series 10, image 23), right parietal lesion that measures 1.0 x 0.9 x 1.0 cm (series 9, image 80 and series 10, image 20), a left parietal lesion that measures 0.4 x 0.8 x 0.5 cm (series 9, image 82 and series 10, image 22), a left occipital lesion that measures 0.5 x 0.8 x 0.6 cm (series 9, image 124 and series 10, image 8), and a right occipital lesion (or possibly 2 adjacent lesions) that measures 1.2 x 1.7 x 0.9 cm (series 9, image 128 and series 10, image 8). Possible additional lesions in the right frontal lobe adjacent to the right lateral ventricle, which is faintly visualized on the coronal postcontrast sequences (series 10, image 32), measuring 0.6 x 0.5 cm, and in the atrium of the right lateral ventricle (series 11, image 26), measuring approximately 0.4 cm, best seen on the sagittal sequence. These lesions are associated with surrounding T2 hyperintense signal, likely edema, with the largest area of edema surrounding the  dominant left parieto-occipital lesion. Several of these lesions are also associated with hemosiderin deposition (series 6, image 54, 76, 81, and 82). Two rounded areas of hemosiderin deposition in the left lateral ventricle atrium (series 6, image 76 and 69) are difficult to find definite enhancing lesions to correlate with on the axial sequences, given enhancement in the choroid plexus, but possible correlates are suspected on the sagittal postcontrast sequence (series 11, image 24 and 26). No other areas of parenchymal  hemorrhage. No definite midline shift. 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 No hydrocephalus or extra-axial collection. No diffusion-weighted sequence was available to evaluate for infarct given the selected protocol. Vascular: The left PCA flow void is not well visualized, however the vessel is seen on postcontrast imaging. Otherwise normal flow voids. Skull and upper cervical spine: Normal marrow signal. Sinuses/Orbits: Status post bilateral lens replacements. Otherwise negative. Other: Trace fluid in the mastoid air cells. IMPRESSION: Evaluation is limited by motion artifact. Within this limitation, multiple enhancing lesions in the bilateral cerebral hemispheres, with additional extra-axial lesion along the right parietal lobe and possible lesions in the atrium of the left lateral ventricle. Several of these lesions demonstrate associated edema and intralesional hemorrhage, including the dominant left parieto-occipital lesion. These are overall concerning for metastatic disease. Additional lesions may be present, but obscured by the motion artifact. Electronically Signed   By: Merilyn Baba M.D.   On: 10/16/2021 02:05   [place] CORE BIOPSY (SOFT TISSUE)  Result Date: 10/17/2021 INDICATION: 56 year old female with history of left renal mass and multiple diffuse metastatic lesions. EXAM: Ultrasound-guided soft tissue mass biopsy MEDICATIONS: None.  ANESTHESIA/SEDATION: Moderate (conscious) sedation was employed during this procedure. A total of Versed 1 mg and Fentanyl 25 mcg was administered intravenously. Moderate Sedation Time: 8 minutes. The patient's level of consciousness and vital signs were monitored continuously by radiology nursing throughout the procedure under my direct supervision. FLUOROSCOPY TIME:  None. COMPLICATIONS: None immediate. PROCEDURE: Informed written consent was obtained from the patient after a thorough discussion of the procedural risks, benefits and alternatives. All questions were addressed. Maximal Sterile Barrier Technique was utilized including caps, mask, sterile gowns, sterile gloves, sterile drape, hand hygiene and skin antiseptic. A timeout was performed prior to the initiation of the procedure. Preprocedure ultrasound evaluation of the right posterior flank demonstrates subcutaneous hypoechoic soft tissue nodule measuring approximately 2.0 x 1.0 cm. Procedure was planned. Subdermal Local anesthesia was provided at the planned needle entry site. Deeper local anesthetic was administered under ultrasound guidance to the periphery of the targeted mass. A small skin nick was made. Under direct ultrasound visualization, a 17 gauge introducer needle was directed to the periphery of the mass. Next, a total of 3, 18 gauge core biopsies were obtained and placed in formalin. The introducer needle was removed. Postprocedure image demonstrated no evidence of surrounding hematoma or other procedure related complication. The patient tolerated the procedure well and was transferred back to the floor in stable condition. IMPRESSION: Technically successful ultrasound-guided core needle biopsy of right posterior flank subcutaneous soft tissue mass. Ruthann Cancer, MD [facility] Electronically Signed   By: Ruthann Cancer M.D.   On: 10/17/2021 12:02        Scheduled Meds:   dextrose  12.5 g Intravenous STAT   insulin aspart  0-15 Units Subcutaneous Q4H   pantoprazole  40 mg Oral BID   simvastatin  40 mg Oral QHS   sucralfate  1 g Oral TID WC & HS   Continuous Infusions:  Assessment & Plan:   Principal Problem:   Symptomatic anemia Active Problems:   Benign essential HTN   Essential hypertension   Chronic diastolic CHF (congestive heart failure) (HCC)   AKI (acute kidney injury) (HCC)   Stage 3 chronic kidney disease (HCC)   Diabetic peripheral neuropathy (HCC)   Poorly controlled type 2 diabetes mellitus ([place])   Metastatic disease ([place])   Left renal mass  Melena   Blood in the stool   Symptomatic anemia due to upper GI bleed from fungating gastric ulcer:  EGD as above.  Concern metastatic RCC.   Transfused 2 units    IV ferric gluconate 250 mg daily x2 doses Appreciate input by GI  Radiation oncology versus IR if patient continues to bleed PPI twice daily indefinitely and Carafate 1 g p.o. 4 times daily for 2 weeks. -Monitor H&H -Transfuse for Hgb <  7.0. -Counseled against NSAID. 2/1 anemia panel low iron. Spoke to oncology Dr. Lorenso Courier rec. Iv iron today x1    Left renal mass with concern for metastatic RCC: CT abdomen and pelvis showed 10 cm left renal mass with invasion of the left posterior abdominal wall soft tissue concerning for RCC with widespread metastasis including 2.7 cm peripheral hyperdense masslike area in the left parietal occipital lobe with vasogenic edema and concern for hemorrhagic metastasis.  She had widespread nodules on her neck, chest and back.  MRI brain with multiple enhancing lesion with edema and intralesional hemorrhage concerning for metastatic disease. -No plan for urologic intervention -Doubt surgical intervention by neurosurgery -IR consulted for tissue biopsy 2/1 f/u with oncology as outpt Follow-up with radiation oncology as outpatient for 3T MRI.  Will need Valium 5 mg p.o. 60 minutes prior to MRI Urine  pregnancy obtain    Chronic diastolic CHF: TTE in 6659 with LVEF of 55 to 60%, G2 DD and RVSP of 49 mmHg.  On torsemide at home.  2/1 euvolemic on exam  Holding torsemide for now  No acute exacerbation      Controlled diabetes with hyperglycemia, neuropathy and other complications: D3T 7.0%. -Continue SSI and statin 2/1 had episodes of hypoglycemia requiring OJ.  Will DC standing dose of Levemir and continue with ISS    CKD-3B/azotemia: Cr 1.18 in 08/2019.  Initially thought to have AKI but likely progressive CKD.  Azotemia likely from GI bleed. -Discontinued IV fluid   Essential hypertension: BP slightly elevated. -Resume home Coreg -Continue holding hydralazine and torsemide.   Hypokalemia: Resolved.   Class I obesity Body mass index is 34.1 kg/m.   DVT prophylaxis: SCD Code Status: Full Family Communication: None at bedside Disposition Plan: Back home Status is: Inpatient Remains inpatient appropriate because: IV treatment                LOS: 4 days   Time spent: 35 minutes with more than 50% on coc    Nolberto Hanlon, MD [facility] Pager [phone]  If 7PM-7AM, please contact night-coverage 10/17/2021, 4:09 PM

## 2021-10-17 NOTE — Progress Notes (Signed)
Dr. Isidore Moos has requested a urine pregnancy test to r/o pregnancy out of an abundance of caution before pt moves forward with radiation treatment planning for metastatic disease in the brain.   Mont Dutton R.T.(R)(T) Radiation Special Procedures Navigator  Order has been entered

## 2021-10-17 NOTE — Progress Notes (Incomplete)
Hypoglycemic Event  CBG:   Latest Reference Range & Units 10/17/21 06:57  Glucose-Capillary 70 - 99 mg/dL 57 (L)  (L): Data is abnormally low  Treatment: D50 25 mL (12.5 gm)  Symptoms: Pale  Follow-up CBG: Time:  CBG Result:  Possible Reasons for Event: Inadequate meal intake  Comments/MD notified:    Viviano Simas

## 2021-10-17 NOTE — Progress Notes (Signed)
Inpatient Diabetes Program Recommendations  AACE/ADA: New Consensus Statement on Inpatient Glycemic Control (2015)  Target Ranges:  Prepandial:   less than 140 mg/dL      Peak postprandial:   less than 180 mg/dL (1-2 hours)      Critically ill patients:  140 - 180 mg/dL   Lab Results  Component Value Date   GLUCAP 155 (H) 10/17/2021   HGBA1C 5.4 10/13/2021    Review of Glycemic Control  Latest Reference Range & Units 10/17/21 00:00 10/17/21 00:22 10/17/21 04:01 10/17/21 04:27 10/17/21 06:57 10/17/21 07:19  Glucose-Capillary 70 - 99 mg/dL 68 (L) 162 (H) 82 75 57 (L) 155 (H)  (L): Data is abnormally low (H): Data is abnormally high  Diabetes history: DM1   Outpatient Diabetes medications:  Omnipod insulin pump & Dexcom CGM Basal--0.9 units/hour (total daily basal 21.6 units) Insulin carb ratio--1 units: 10 carbs Correction-- 1 units drops blood sugar 40 mg/dL Target 140-150   Current orders for Inpatient glycemic control: Levimir 25 units QD, Novolog 0-15 units Q4H   Inpatient Diabetes Program Recommendations:     Levemir 18 units QD Novolog 0-9 units TID and 0-5 QHS  Will continue to follow while inpatient.  Thank you, Reche Dixon, MSN, RN Diabetes Coordinator Inpatient Diabetes Program [phone] (team pager from 8a-5p)

## 2021-10-18 ENCOUNTER — Encounter (HOSPITAL_COMMUNITY): Payer: Self-pay

## 2021-10-18 ENCOUNTER — Inpatient Hospital Stay (HOSPITAL_COMMUNITY): Admission: RE | Admit: 2021-10-18 | Payer: Medicaid Other | Source: Ambulatory Visit

## 2021-10-18 ENCOUNTER — Ambulatory Visit (HOSPITAL_COMMUNITY): Payer: Medicaid Other

## 2021-10-18 DIAGNOSIS — D649 Anemia, unspecified: Secondary | ICD-10-CM | POA: Diagnosis not present

## 2021-10-18 LAB — GLUCOSE, CAPILLARY
Glucose-Capillary: 100 mg/dL — ABNORMAL HIGH (ref 70–99)
Glucose-Capillary: 111 mg/dL — ABNORMAL HIGH (ref 70–99)
Glucose-Capillary: 160 mg/dL — ABNORMAL HIGH (ref 70–99)
Glucose-Capillary: 95 mg/dL (ref 70–99)

## 2021-10-18 MED ORDER — SUCRALFATE 1 GM/10ML PO SUSP: 1.0000 g | Freq: Three times a day (TID) | ORAL | 0 refills | Status: DC

## 2021-10-18 MED ORDER — DIAZEPAM 5 MG PO TABS: 5.0000 mg | ORAL_TABLET | Freq: Three times a day (TID) | ORAL | 0 refills | Status: DC | PRN

## 2021-10-18 MED ORDER — PANTOPRAZOLE SODIUM 40 MG PO TBEC: 40.0000 mg | DELAYED_RELEASE_TABLET | Freq: Two times a day (BID) | ORAL | 0 refills | Status: AC

## 2021-10-18 NOTE — Progress Notes (Signed)
Physical Therapy Treatment Patient Details Name: Natalie Contreras MRN: [id number] DOB: [birth year] Today's Date: 10/18/2021   History of Present Illness Pt is a 56 yr old female who presented due to feeling weak over the last several months. CT showed multiple nodular opacities, concerning for malignancy and metastases. with intracranial masses as well, Neurosurgery and Oncology on board;  Pt HTN, DM, CHF, PAD, R BKA    PT Comments    Session focused on family education re: stair negotiation and ideas for safe dc options; Pt's daughter and her girlfriend demonstrated correct technique for assisting with stairs (including forwards technique, sideways technique, and using a wheelchair with 2 person assist); Questions solicited and answered; OK for dc home from PT standpoint    DC plan discussion with pt and family; current plan to get pt to her home in the short-term is appropriate, and I emphasized that when pt needs the extra assist to go to stay with her daughter and family; Daughter states the lease for their current house has one more month, and they plan to move to a more accessible residence at that time  Recommendations for follow up therapy are one component of a multi-disciplinary discharge planning process, led by the attending physician.  Recommendations may be updated based on patient status, additional functional criteria and insurance authorization.  Follow Up Recommendations  Outpatient PT (Pt's insurance doesn't cover HHPT; Rec Outpt PT -- may have difficulty with scheduling rides)     Assistance Recommended at Discharge PRN  Patient can return home with the following Help with stairs or ramp for entrance   Equipment Recommendations  Other (comment) Sales executive chair for stair negotiation; delivered to room)    Recommendations for Other Services Other (comment) (Are there any supportive organizations that can help with obtaining stair lifts?)     Precautions / Restrictions  Precautions Precautions: Fall     Mobility    Stairs  General stair comments: Demonstrated and practiced teh methods for stair negotiation that pt and I had practiced earlier; Pt's daughter and girlfriend demonstrated understanding of assisting with stairs; Pt also has a wheelchair, which can be used with 2 person assist  to get up stairs   Wheelchair Mobility    Modified Rankin (Stroke Patients Only)       Balance     Sitting balance-Leahy Scale: Good       Standing balance-Leahy Scale: Fair                              Cognition Arousal/Alertness: Awake/alert Behavior During Therapy: WFL for tasks assessed/performed Overall Cognitive Status: Within Functional Limits for tasks assessed                                          Exercises      General Comments General comments (skin integrity, edema, etc.): DC plan discussion with pt and family; current plan to get pt to her home in the short-term is appropriate, and I emphasized that when pt needs the extra assist to go to stay with her daughter and family; Daughter states the lease for their current house has one more month, and they plan to move to a more accessible residence at that time      Pertinent Vitals/Pain Pain Assessment Pain Assessment:  (not feeling well, some nausea after moving  bowels)    Home Living                          Prior Function            PT Goals (current goals can now be found in the care plan section) Acute Rehab PT Goals Patient Stated Goal: Be able to get up the stairs into her duaghter's home PT Goal Formulation: With patient/family Time For Goal Achievement: 10/30/21 Potential to Achieve Goals: Good Progress towards PT goals: Progressing toward goals    Frequency    Min 3X/week      PT Plan Current plan remains appropriate    Co-evaluation              AM-PAC PT "6 Clicks" Mobility   Outcome Measure  Help needed  turning from your back to your side while in a flat bed without using bedrails?: None Help needed moving from lying on your back to sitting on the side of a flat bed without using bedrails?: None Help needed moving to and from a bed to a chair (including a wheelchair)?: A Little Help needed standing up from a chair using your arms (e.g., wheelchair or bedside chair)?: A Little Help needed to walk in hospital room?: A Little Help needed climbing 3-5 steps with a railing? : A Lot 6 Click Score: 19    End of Session Equipment Utilized During Treatment: Gait belt Activity Tolerance: Other (comment) (Good family discussion and particiaption) Patient left: in chair;with call bell/phone within reach;with family/visitor present Nurse Communication: Mobility status (and family arrived) PT Visit Diagnosis: Other abnormalities of gait and mobility (R26.89);Muscle weakness (generalized) (M62.81)     Time: 0600-4599 PT Time Calculation (min) (ACUTE ONLY): 25 min  Charges:  $Gait Training: 23-37 mins $Therapeutic Activity: 8-22 mins $Self Care/Home Management: [place], PT  Acute Rehabilitation Services Pager [phone] Office [phone]    Natalie Contreras 10/18/2021, 2:12 PM

## 2021-10-18 NOTE — Progress Notes (Signed)
Physical Therapy Treatment Patient Details Name: Natalie Contreras MRN: [id number] DOB: Jun 22, 1966 Today's Date: 10/18/2021   History of Present Illness Pt is a 56 yr old female who presented due to feeling weak over the last several months. CT showed multiple nodular opacities, concerning for malignancy and metastases. with intracranial masses as well, Neurosurgery and Oncology on board;  Pt HTN, DM, CHF, PAD, R BKA    PT Comments    Continuing work on functional mobility and activity tolerance;  Session focused on stair training, and this time we opted to go to practice stairs in Neuro gym; Pt was able to go up and down 3 steps x2; went up forwards using two rails -- will likely not be able to reach both rails at daugther's home -- discussed using one rail and other arm over daughter's shoulder; Able to go up and down using sideways technqiue as well, incr time for foot placement;   Plan to return for a session when her daughter arrives.   Recommendations for follow up therapy are one component of a multi-disciplinary discharge planning process, led by the attending physician.  Recommendations may be updated based on patient status, additional functional criteria and insurance authorization.  Follow Up Recommendations  Outpatient PT (Pt's insurance doesn't cover HHPT; Rec Outpt PT -- may have difficulty with scheduling rides)     Assistance Recommended at Discharge Set up Supervision/Assistance  Patient can return home with the following Help with stairs or ramp for entrance   Equipment Recommendations  Other (comment) Sales executive chair for stair negotiation)    Recommendations for Other Services Other (comment) (Are there any supportive organizations that can help with obtaining stair lifts?)     Precautions / Restrictions Precautions Precautions: Fall Restrictions Weight Bearing Restrictions: No     Mobility  Bed Mobility                    Transfers Overall transfer  level: Needs assistance Equipment used: Rolling walker (2 wheels) Transfers: Sit to/from Stand Sit to Stand: Min guard           General transfer comment: min guard to rise from recliner with initial steadying assist as pt getting her prosthetic locked in    Ambulation/Gait Ambulation/Gait assistance: Supervision Gait Distance (Feet): 50 Feet (in short bursts) Assistive device: Rolling walker (2 wheels)         General Gait Details: Slow, deliberate steps; Cues for hand placement and anterior weight shift    Stairs Stairs: Yes Stairs assistance: Min guard, Min assist Stair Management: Two rails, Step to pattern, Forwards, One rail Right, Sideways Number of Stairs: 3 (3+3) General stair comments: Pt much more comfortable practicing in the stair mockup on 3W; Practiced forward technique and sideways technique; Difficulty with R foot (prosthesis) placement steping down, and allowing room for L foot -- able to adjust R foot position when needed, but taking lots of time and adding to fatigue   Wheelchair Mobility    Modified Rankin (Stroke Patients Only)       Balance     Sitting balance-Leahy Scale: Good       Standing balance-Leahy Scale: Fair                              Cognition Arousal/Alertness: Awake/alert Behavior During Therapy: WFL for tasks assessed/performed Overall Cognitive Status: Within Functional Limits for tasks assessed  Exercises      General Comments General comments (skin integrity, edema, etc.): Continued discussion re dc plan, echoing discussion with COTA earlier "discussed DC plan as pt wants to go to her house initially to "regroup" and then transition to stayin with her daughter, reiterated that pt would need assistance at either location. pt reports her biggest concern is the stairs at her daughters house, pt reports she doesn't think her daughter could stay with  her for an extended period of time as she works 5 days a week. pt said she is not eligible for a in home aid"; Discussed concerns for pt, including long-term considerations with Dr. Gwynneth Albright      Pertinent Vitals/Pain Pain Assessment Pain Assessment: No/denies pain    Home Living                          Prior Function            PT Goals (current goals can now be found in the care plan section) Acute Rehab PT Goals Patient Stated Goal: Be able to get up the stairs into her duaghter's home PT Goal Formulation: With patient/family Time For Goal Achievement: 10/30/21 Potential to Achieve Goals: Good Progress towards PT goals: Progressing toward goals    Frequency    Min 3X/week      PT Plan Current plan remains appropriate    Co-evaluation              AM-PAC PT "6 Clicks" Mobility   Outcome Measure  Help needed turning from your back to your side while in a flat bed without using bedrails?: None Help needed moving from lying on your back to sitting on the side of a flat bed without using bedrails?: None Help needed moving to and from a bed to a chair (including a wheelchair)?: A Little Help needed standing up from a chair using your arms (e.g., wheelchair or bedside chair)?: A Little Help needed to walk in hospital room?: A Little Help needed climbing 3-5 steps with a railing? : A Lot 6 Click Score: 19    End of Session Equipment Utilized During Treatment: Gait belt Activity Tolerance: Patient tolerated treatment well (though quite fatigued after stair training) Patient left: in chair;with call bell/phone within reach;with family/visitor present Nurse Communication: Mobility status PT Visit Diagnosis: Other abnormalities of gait and mobility (R26.89);Muscle weakness (generalized) (M62.81)     Time: 9038-3338 PT Time Calculation (min) (ACUTE ONLY): 52 min  Charges:  $Gait Training: 23-37 mins $Therapeutic Activity: 8-22 mins                      Roney Marion, PT  Acute Rehabilitation Services Pager [phone] Office [place] 10/18/2021, 11:33 AM

## 2021-10-18 NOTE — Discharge Summary (Addendum)
Natalie Contreras DGU:[id number] DOB: January 11, 1966 DOA: 10/13/2021  PCP: Celene Squibb, MD  Admit date: 10/13/2021 Discharge date: 10/18/2021  Admitted From: Home Disposition: Home  Recommendations for Outpatient Follow-up:  Follow up with PCP in 1 week Please obtain BMP/CBC in one week Please follow up Dr. Lorenso Courier oncology in 1 week Follow-up with radiation oncology     Discharge Condition:Stable CODE STATUS: Full Diet recommendation: Heart Healthy  Brief/Interim Summary: Per HPI  56 year old F with PMH of DM-2 with neuropathy, diastolic CHF, HTN, PAD, right BKA and tobacco use disorder presenting to [facility] with generalized weakness for 2 months, melena and enlarging tender nodes on head and neck, chest and back, and admitted for symptomatic anemia and 10 cm left renal mass with invasion of the left posterior abdominal wall soft tissue concerning for RCC with widespread metastasis including 2.7 cm peripheral hyperdense masslike area in the left parietal occipital lobe with vasogenic edema and concern for hemorrhagic metastasis.  Hgb 6.2.  Hemoccult positive.  Transfused 2 units.  Hgb improved to 7.4 and remained stable.  GI, neurosurgery, urology and oncology consulted.  No plan for urologic intervention.  Neurosurgery ordered MRI brain that showed multiple enhancing lesions with edema and intralesional hemorrhage concerning for metastatic disease.  Patient has no focal neurologic symptoms or headache. Patient underwent EGD that showed oozing fungating cratered gastric ulcers, concerning for malignancy that has been biopsied. She was cleared for discharge with follow up as above.    Symptomatic anemia due to upper GI bleed from fungating gastric ulcer:  EGD as above  concern metastatic RCC.   Transfused 2 units    IV ferric gluconate 250 mg daily x2 doses PPI twice daily indefinitely and Carafate 1 g p.o. 4 times daily for 2 weeks. -Counseled against NSAID. 2/1 anemia panel low iron. 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  She had widespread nodules on her neck, chest and back.  MRI brain with multiple enhancing lesion with edema and intralesional hemorrhage concerning for metastatic disease. -No plan for urologic intervention -Doubt surgical intervention by neurosurgery -IR consulted for tissue biopsy f/u with oncology as outpt Follow-up with radiation oncology as outpatient for 3T MRI.  Given RX Valium 5 mg p.o. x1 60 minutes prior to MRI Urine pregnancy obtained     Chronic diastolic CHF:  TTE in 9563 with LVEF of 55 to 60%, G2 DD and RVSP of 49 mmHg.  Holding torsemide No acute examcerbation          Controlled diabetes with hyperglycemia, neuropathy and other complications: O7F 6.4%. Continue home meds if appetite good     CKD-3B/azotemia: Cr 1.18 in 08/2019.  Initially thought to have AKI but likely progressive CKD.  Azotemia likely from GI bleed. Was given ivf   Essential hypertension:  More stable    Hypokalemia: Resolved.   Class I obesity Body mass index is 34.1 kg/m  Discharge Diagnoses:  Principal Problem:   Symptomatic anemia Active Problems:   Benign essential HTN   Essential hypertension   Chronic diastolic CHF (congestive heart failure) (HCC)   AKI (acute kidney injury) (HCC)   Stage 3 chronic  kidney disease ([place])   Diabetic peripheral neuropathy (HCC)   Poorly controlled type 2 diabetes mellitus ([place])   Metastatic disease ([place])   Left renal mass   Melena   Blood in the stool    Discharge Instructions  Discharge Instructions     Ambulatory referral to Occupational Therapy    Complete by: As directed    Ambulatory referral to Physical Therapy   Complete by: As directed    Call MD for:  temperature >100.4   Complete by: As directed    Diet - low sodium heart healthy   Complete by: As directed    Discharge instructions   Complete by: As directed    Monitor blood glucose closely if you are not eating much needs adjustments of diabetic meds F/u with Dr. Lorenso Courier and Dr. Enis Slipper.   Discharge wound care:   Complete by: As directed    As above   Increase activity slowly   Complete by: As directed       Allergies as of 10/18/2021   No Known Allergies      Medication List     STOP taking these medications    aspirin EC 81 MG tablet   hydrALAZINE 50 MG tablet Commonly known as: APRESOLINE   Kerendia 10 MG Tabs Generic drug: Finerenone   metFORMIN 1000 MG tablet Commonly known as: GLUCOPHAGE   methocarbamol 500 MG tablet Commonly known as: ROBAXIN   oxyCODONE 5 MG immediate release tablet Commonly known as: Oxy IR/ROXICODONE   torsemide 20 MG tablet Commonly known as: DEMADEX   traMADol 50 MG tablet Commonly known as: ULTRAM   traZODone 50 MG tablet Commonly known as: DESYREL       TAKE these medications    acetaminophen 325 MG tablet Commonly known as: TYLENOL Take 1-2 tablets (325-650 mg total) by mouth every 4 (four) hours as needed for mild pain.   carvedilol 3.125 MG tablet Commonly known as: COREG TAKE (1) TABLET BY MOUTH TWICE DAILY WITH A MEAL. What changed: See the new instructions.   Dexcom G6 Sensor Misc SMARTSIG:1 Each Topical Every 10 Days   diazepam 5 MG tablet Commonly known as: Valium Take 1 tablet (5 mg total) by mouth every 8 (eight) hours as needed for up to 1 dose for anxiety.   docusate sodium 100 MG capsule Commonly known as: COLACE Take 1 capsule (100 mg total) by mouth 2 (two) times daily.   ergocalciferol 1.25 MG (50000 UT) capsule Commonly known as: VITAMIN D2 Take 50,000 Units by mouth once a  week. Sunday   insulin aspart 100 UNIT/ML injection Commonly known as: novoLOG Up to 100 units per day in omni pod. Omnipod to be filled with Novolog to be accurately injected by Omnipod based on settings.   Jardiance 25 MG Tabs tablet Generic drug: empagliflozin Take 25 mg by mouth daily.   multivitamin with minerals Tabs tablet Take 1 tablet by mouth daily.   pantoprazole 40 MG tablet Commonly known as: PROTONIX Take 1 tablet (40 mg total) by mouth 2 (two) times daily.   polyethylene glycol 17 g packet Commonly known as: MIRALAX / GLYCOLAX Take 17 g by mouth 2 (two) times daily.   simvastatin 40 MG tablet Commonly known as: ZOCOR Take 40 mg by mouth at bedtime.   sucralfate 1 GM/10ML suspension Commonly known as: CARAFATE Take 10 mLs (1 g total) by mouth 4 (four) times daily -  with meals and at bedtime.  Discharge Care Instructions  (From admission, onward)           Start     Ordered   10/18/21 0000  Discharge wound care:       Comments: As above   10/18/21 0955            Follow-up Information     Orson Slick, MD Follow up in 1 week(s).   Specialty: Hematology and Oncology Why: hospital f/u per Dr. Marina Goodell information: [place] [address] [place], John Z, MD Follow up in 1 week(s).   Specialty: Internal Medicine Contact information: [place] [facility] [address] [phone]         Eppie Gibson, MD Follow up in 1 week(s).   Specialty: Radiation Oncology Contact information: [address] [address] [phone]                No Known Allergies  Consultations: Oncology, IR, GI, urology,     Procedures/Studies: DG Chest 2 View  Result Date: 10/13/2021 CLINICAL DATA:  smoker. weakness. rule out infection/malignancy EXAM: CHEST - 2 VIEW COMPARISON:  April 07, 2017 FINDINGS: The cardiomediastinal silhouette is enlarged in  contour.Atherosclerotic calcifications. No pleural effusion. No pneumothorax. Multiple nodular opacities throughout bilateral lungs. Surgical clips project over the upper abdomen. Suggestion of sclerosis of several vertebral bodies. IMPRESSION: 1. Multiple nodular opacities bilaterally. Suggestion of multifocal sclerosis of the vertebral bodies versus summation artifact. Findings are concerning for multifocal metastases. Recommend further dedicated evaluation with cross-sectional imaging. Electronically Signed   By: Valentino Saxon M.D.   On: 10/13/2021 15:29   CT Head Wo Contrast  Result Date: 10/13/2021 CLINICAL DATA:  dizziness. blurred vision. EXAM: CT HEAD WITHOUT CONTRAST TECHNIQUE: Contiguous axial images were obtained from the base of the skull through the vertex without intravenous contrast. RADIATION DOSE REDUCTION: This exam was performed according to the departmental dose-optimization program which includes automated exposure control, adjustment of the mA and/or kV according to patient size and/or use of iterative reconstruction technique. COMPARISON:  February 17, 2017. FINDINGS: Brain: There is a lesion in the LEFT parieto-occipital lobe with adjacent vasogenic edema and a peripheral rim of intrinsic hyperdensity. It measures 2.6 x 2.4 by 2.7 cm. There is an additional intrinsically hyperdense region along the RIGHT frontoparietal falx which measures 7 by 5 mm which is concerning for an additional site of disease (series 2, image 23). Hyperdensity of the LEFT frontal lobe measuring approximately 5 mm likely reflects an additional site of disease (series 2, image 25). Hypodensity of the RIGHT occipital lobe with a small amount of intrinsic hyperdensity likely reflecting additional lesion (series 2, image 12; series 5, image 28). Hypodensity of the LEFT inferior cerebellum, consistent with the sequela of remote prior infarction and similar comparison to prior. No significant midline shift. Vascular:  Vascular calcifications of the carotid siphons. Skull: No acute fracture. Sinuses/Orbits: LEFT mastoid effusion. Other: None. IMPRESSION: 1. There is a 2.7 cm peripherally hyperdense masslike area in the LEFT parieto-occipital lobe with adjacent vasogenic edema. 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 Recommend further evaluation with dedicated brain MRI with and without contrast. 2. There are likely additional lesions in the bilateral cerebral hemispheres. These results were called by telephone at the time of interpretation on 10/13/2021 at 3:20 pm to provider MARGAUX VENTER , who verbally acknowledged these results. Electronically Signed   By: Valentino Saxon M.D.  On: 10/13/2021 15:28   MR Brain W Wo Contrast  Result Date: 10/20/2021 CLINICAL DATA:  Brain metastases, unknown primary 3T SRS Protocol for treatment planning EXAM: MRI HEAD WITHOUT AND WITH CONTRAST TECHNIQUE: Multiplanar, multiecho pulse sequences of the brain and surrounding structures were obtained without and with intravenous contrast. CONTRAST:  73mL GADAVIST GADOBUTROL 1 MMOL/ML IV SOLN COMPARISON:  10/15/2021 FINDINGS: Brain: Multiple parenchymal masses are again identified. Possible dural lesion along the falx is also again noted. In the interval, there has been evolution of hemorrhage associated with the lesions that now demonstrate significant intrinsic T1 shortening including within the surrounding area of edema. Therefore, evaluation for enhancement is now limited. Greater than 10 lesions are present. A few were not visible on the prior study or are new. For example, along the body of the caudate on the right (series 1100, image 178) and several punctate lesions in the right cerebral hemisphere (images 178, 181, 192, 197, and 202). Many of these lesions demonstrate corresponding diffusion hyperintensity. There also additional foci of diffusion hyperintensity in the frontoparietal white matter bilaterally without definite  corresponding enhancement. This was also present on the prior study. The right caudate body lesion was visible on SWI on the prior study. There is trace subdural hemorrhage along the cerebral convexities posteriorly. Likely minimal sulcal subarachnoid hemorrhage associated with the dominant left parieto-occipital lesion. No significant change in mass effect. Vascular: Major vessel flow voids at the skull base are preserved. Skull and upper cervical spine: Normal marrow signal is preserved. Sinuses/Orbits: Paranasal sinuses are aerated. Orbits are unremarkable. Other: Sella is unremarkable.  Mastoid air cells are clear. IMPRESSION: Multiple (greater than 10) hemorrhagic lesions are present with evolution of intralesional hemorrhage in the interval. There is T1 hyperintensity of the perilesional edema, a finding more commonly associated with cavernous malformations, but these are presumed to reflect metastases in the setting of renal malignancy. A few of the lesions were not visible on the prior study or are new. Many of these demonstrate corresponding diffusion hyperintensity. Therefore, subacute infarcts are consideration for this subset. Electronically Signed   By: Macy Mis M.D.   On: 10/20/2021 14:05   MR BRAIN W WO CONTRAST  Result Date: 10/16/2021 CLINICAL DATA:  CNS neoplasm, [facility] protocol EXAM: MRI HEAD WITHOUT AND WITH CONTRAST TECHNIQUE: Multiplanar, multiecho pulse sequences of the brain and surrounding structures were obtained without and with intravenous contrast. CONTRAST:  63mL GADAVIST GADOBUTROL 1 MMOL/ML IV SOLN COMPARISON:  No prior MRI, correlation is made with 10/13/2021 CT head FINDINGS: Evaluation is limited by motion artifact. Brain: Multiple enhancing masses in the bilateral cerebral hemispheres, the largest of which is in the left parietooccipital lobe and measures up to 3.3 x 2.9 x 3.3 cm (AP x TR x CC) (series 9, image 92). Additional smaller lesions include a left parietal  lesion that measures 0.7 x 0.5 x 0.7 cm (series 9, image 39 and series 10, image 29), a left frontal lobe lesion that measures 0.6 x 0.4 x 0.6 cm (series 9, image 139 and series 10, image 43), a right frontal lobe lesion that measures 0.3 x 0.4 x 0.4 cm (series 9, image 50 and series 10, image 52), a right extra-axial lesion along the falx adjacent to the right parietal lobe that measures 1.0 x 0.5 x 1.0 cm (series 9, image 60 and series 10, image 23), right parietal lesion that measures 1.0 x 0.9 x 1.0 cm (series 9, image 80 and series 10, image 20), a left  parietal lesion that measures 0.4 x 0.8 x 0.5 cm (series 9, image 82 and series 10, image 22), a left occipital lesion that measures 0.5 x 0.8 x 0.6 cm (series 9, image 124 and series 10, image 8), and a right occipital lesion (or possibly 2 adjacent lesions) that measures 1.2 x 1.7 x 0.9 cm (series 9, image 128 and series 10, image 8). Possible additional lesions in the right frontal lobe adjacent to the right lateral ventricle, which is faintly visualized on the coronal postcontrast sequences (series 10, image 32), measuring 0.6 x 0.5 cm, and in the atrium of the right lateral ventricle (series 11, image 26), measuring approximately 0.4 cm, best seen on the sagittal sequence. These lesions are associated with surrounding T2 hyperintense signal, likely edema, with the largest area of edema surrounding the dominant left parieto-occipital lesion. Several of these lesions are also associated with hemosiderin deposition (series 6, image 54, 76, 81, and 82). Two rounded areas of hemosiderin deposition in the left lateral ventricle atrium (series 6, image 76 and 69) are difficult to find definite enhancing lesions to correlate with on the axial sequences, given enhancement in the choroid plexus, but possible correlates are suspected on the sagittal postcontrast sequence (series 11, image 24 and 26). No other areas of parenchymal hemorrhage. No definite midline shift.  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 No hydrocephalus or extra-axial collection. No diffusion-weighted sequence was available to evaluate for infarct given the selected protocol. Vascular: The left PCA flow void is not well visualized, however the vessel is seen on postcontrast imaging. Otherwise normal flow voids. Skull and upper cervical spine: Normal marrow signal. Sinuses/Orbits: Status post bilateral lens replacements. Otherwise negative. Other: Trace fluid in the mastoid air cells. IMPRESSION: Evaluation is limited by motion artifact. Within this limitation, multiple enhancing lesions in the bilateral cerebral hemispheres, with additional extra-axial lesion along the right parietal lobe and possible lesions in the atrium of the left lateral ventricle. Several of these lesions demonstrate associated edema and intralesional hemorrhage, including the dominant left parieto-occipital lesion. These are overall concerning for metastatic disease. Additional lesions may be present, but obscured by the motion artifact. Electronically Signed   By: Merilyn Baba M.D.   On: 10/16/2021 02:05   CT CHEST ABDOMEN PELVIS W CONTRAST  Result Date: 10/13/2021 CLINICAL DATA:  Brain metastasis of unknown primary. EXAM: CT CHEST, ABDOMEN, AND PELVIS WITH CONTRAST TECHNIQUE: Multidetector CT imaging of the chest, abdomen and pelvis was performed following the standard protocol during bolus administration of intravenous contrast. RADIATION DOSE REDUCTION: This exam was performed according to the departmental dose-optimization program which includes automated exposure control, adjustment of the mA and/or kV according to patient size and/or use of iterative reconstruction technique. CONTRAST:  9mL OMNIPAQUE IOHEXOL 300 MG/ML  SOLN COMPARISON:  None. FINDINGS: CT CHEST FINDINGS Cardiovascular: Mild cardiomegaly. Aortic and coronary atherosclerotic calcification noted. Mediastinum/Lymph Nodes:  Mild bilateral hilar lymphadenopathy is seen. No pathologically enlarged mediastinal or axillary lymph nodes are identified. Lungs/Pleura: Multiple solid bilateral pulmonary nodules are seen, consistent with diffuse pulmonary metastases. Index nodule in the medial right lower lobe measures 2.9 x 1.7 cm on image 92/3. Index nodule in the posterolateral left lower lobe measures 4.4 x 2.2 cm on image 109/3. Small left pleural effusion also seen. Musculoskeletal: Multiple chest wall soft tissue masses are seen bilaterally, involving the breasts, chest wall muscles and subcutaneous tissues. The 2 largest masses involve the right pectoralis major muscle, measuring 3.3 x 2.9  cm on image 12/2, and the right supraspinatus muscle measuring 5.7 x 3.2 cm on image 21/2. CT ABDOMEN AND PELVIS FINDINGS Hepatobiliary: Multiple hypovascular masses are seen throughout the right and left hepatic lobes, largest in the lateral dome of the right hepatic lobe measuring 10.7 x 5.1 cm. These are consistent diffuse liver metastases. Prior cholecystectomy. No evidence of biliary obstruction. Pancreas:  No mass or inflammatory changes. Spleen:  Within normal limits in size and appearance. Adrenals/Urinary tract: A small right adrenal mass is seen measuring 2.0 x 1.5 cm, which is indeterminate but suspicious for adrenal metastasis. A large heterogeneously enhancing mass is seen involving the posterior left kidney, which shows invasion of the posterior abdominal wall soft tissues. This measures 10.3 x 8.9 cm, and is consistent with primary renal cell carcinoma. Smaller enhancing soft tissue nodules are also seen in the left posterior pararenal space, consistent with metastatic disease. Two small masses are seen along the capsular surface of the anterior mid and lower poles of the right kidney, which measure 2.2 cm and 1.5 cm in diameter. These could represent synchronous primary renal cell carcinomas of the right kidney, or metastatic disease.  Two soft tissue nodules are involve the lateral portion of Gerota's fascia on the right, largest measuring 2.2 x 1.6 cm. A subcapsular cyst measuring 4 cm is also seen in the lateral interpolar region of the right kidney. Stomach/Bowel: No evidence of obstruction, inflammatory process, or abnormal fluid collections. Vascular/Lymphatic: Mild lymphadenopathy is seen in the left paraaortic region, largest measuring 10 mm on image 67/2. A 1.3 cm lymph node is seen in the deep left abdominal small bowel mesentery adjacent to the duodenum. No pelvic lymphadenopathy identified. Aortic atherosclerotic calcification noted. Left retroaortic renal vein is noted, however there is no evidence of renal vein or IVC thrombus. Reproductive: No masses identified. Small amount of gas noted within the endometrial cavity uterus. Other:  None. Musculoskeletal: No suspicious bone lesions identified. Multiple small masses are seen scattered throughout the abdominal wall soft tissues, consistent with metastatic disease. IMPRESSION: 10 cm left renal mass with invasion of the left posterior abdominal wall soft tissues, consistent with primary renal cell carcinoma. Adjacent metastatic soft tissue nodules also noted within the left perinephric space. Diffuse bilateral pulmonary metastases. Two small masses along the capsular surface of the right kidney, which may represent synchronous primary renal cell carcinomas or metastatic disease. Metastatic disease also involving Gerota's fascia on the right. Mild abdominal lymphadenopathy, consistent with metastatic disease. Diffuse liver metastases. Small right adrenal mass, suspicious for metastatic disease. Diffuse bilateral pulmonary metastases. 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 Electronically Signed   By: Marlaine Hind M.D.   On: 10/13/2021 17:53   [place] CORE BIOPSY (SOFT TISSUE)  Result Date: 10/17/2021 INDICATION: 56 year old female  with history of left renal mass and multiple diffuse metastatic lesions. EXAM: Ultrasound-guided soft tissue mass biopsy MEDICATIONS: None. ANESTHESIA/SEDATION: Moderate (conscious) sedation was employed during this procedure. A total of Versed 1 mg and Fentanyl 25 mcg was administered intravenously. Moderate Sedation Time: 8 minutes. The patient's level of consciousness and vital signs were monitored continuously by radiology nursing throughout the procedure under my direct supervision. FLUOROSCOPY TIME:  None. COMPLICATIONS: None immediate. PROCEDURE: Informed written consent was obtained from the patient after a thorough discussion of the procedural risks, benefits and alternatives. All questions were addressed. Maximal Sterile Barrier Technique was utilized including caps, mask, sterile gowns, sterile gloves, sterile drape, hand hygiene and skin  antiseptic. A timeout was performed prior to the initiation of the procedure. Preprocedure ultrasound evaluation of the right posterior flank demonstrates subcutaneous hypoechoic soft tissue nodule measuring approximately 2.0 x 1.0 cm. Procedure was planned. Subdermal Local anesthesia was provided at the planned needle entry site. Deeper local anesthetic was administered under ultrasound guidance to the periphery of the targeted mass. A small skin nick was made. Under direct ultrasound visualization, a 17 gauge introducer needle was directed to the periphery of the mass. Next, a total of 3, 18 gauge core biopsies were obtained and placed in formalin. The introducer needle was removed. Postprocedure image demonstrated no evidence of surrounding hematoma or other procedure related complication. The patient tolerated the procedure well and was transferred back to the floor in stable condition. IMPRESSION: Technically successful ultrasound-guided core needle biopsy of right posterior flank subcutaneous soft tissue mass. Ruthann Cancer, MD Vascular and Interventional Radiology  [facility] Radiology Electronically Signed   By: Ruthann Cancer M.D.   On: 10/17/2021 12:02      Subjective: No shortness of breath or chest pain.  No complaints.  Discharge Exam: Vitals:   10/18/21 0526 10/18/21 0857  BP: (!) 113/44 (!) 131/54  Pulse: 76 72  Resp: 17 19  Temp: 97.6 F (36.4 C) 98 F (36.7 C)  SpO2: 92% 98%   Vitals:   10/17/21 1630 10/17/21 2010 10/18/21 0526 10/18/21 0857  BP: (!) 127/42 (!) 143/36 (!) 113/44 (!) 131/54  Pulse: 73 79 76 72  Resp: 17 20 17 19   Temp: 97.8 F (36.6 C) 97.9 F (36.6 C) 97.6 F (36.4 C) 98 F (36.7 C)  TempSrc: Oral Oral  Oral  SpO2: 100% 99% 92% 98%  Weight:      Height:        General: Pt is alert, awake, not in acute distress Cardiovascular: RRR, S1/S2 +, no rubs, no gallops Respiratory: CTA bilaterally, no wheezing, no rhonchi Abdominal: Soft, NT, ND, bowel sounds + Extremities: no edema    The results of significant diagnostics from this hospitalization (including imaging, microbiology, ancillary and laboratory) are listed below for reference.     Microbiology: Recent Results (from the past 240 hour(s))  Resp Panel by RT-PCR (Flu A&B, Covid) Nasopharyngeal Swab     Status: None   Collection Time: 10/13/21  2:35 PM   Specimen: Nasopharyngeal Swab; Nasopharyngeal(NP) swabs in vial transport medium  Result Value Ref Range Status   SARS Coronavirus 2 by RT PCR NEGATIVE NEGATIVE Final    Comment: (NOTE) SARS-CoV-2 target nucleic acids are NOT DETECTED.  The SARS-CoV-2 RNA is generally detectable in upper respiratory specimens during the acute phase of infection. The lowest concentration of SARS-CoV-2 viral copies this assay can detect is 138 copies/mL. A negative result does not preclude SARS-Cov-2 infection and should not be used as the sole basis for treatment or other patient management decisions. A negative result may occur with  improper specimen collection/handling, submission of specimen  other than nasopharyngeal swab, presence of viral mutation(s) within the areas targeted by this assay, and inadequate number of viral copies(<138 copies/mL). A negative result must be combined with clinical observations, patient history, and epidemiological information. The expected result is Negative.  Fact Sheet for Patients:  EntrepreneurPulse.com.au  Fact Sheet for Healthcare Providers:  IncredibleEmployment.be  This test is no t yet approved or cleared by the [place] FDA and  has been authorized for detection and/or diagnosis of SARS-CoV-2 by FDA under an Emergency Use Authorization (EUA). This EUA  will remain  in effect (meaning this test can be used) for the duration of the COVID-19 declaration under Section 564(b)(1) of the Act, 21 U.S.C.section 360bbb-3(b)(1), unless the authorization is terminated  or revoked sooner.       Influenza A by PCR NEGATIVE NEGATIVE Final   Influenza B by PCR NEGATIVE NEGATIVE Final    Comment: (NOTE) The Xpert Xpress SARS-CoV-2/FLU/RSV plus assay is intended as an aid in the diagnosis of influenza from Nasopharyngeal swab specimens and should not be used as a sole basis for treatment. Nasal washings and aspirates are unacceptable for Xpert Xpress SARS-CoV-2/FLU/RSV testing.  Fact Sheet for Patients: EntrepreneurPulse.com.au  Fact Sheet for Healthcare Providers: IncredibleEmployment.be  This test is not yet approved or cleared by the [place] FDA and has been authorized for detection and/or diagnosis of SARS-CoV-2 by FDA under an Emergency Use Authorization (EUA). This EUA will remain in effect (meaning this test can be used) for the duration of the COVID-19 declaration under Section 564(b)(1) of the Act, 21 U.S.C. section 360bbb-3(b)(1), unless the authorization is terminated or revoked.  Performed at [facility], [address] [place], [place]  [address]      Labs: BNP (last 3 results) No results for input(s): BNP in the last 8760 hours. Basic Metabolic Panel: Recent Labs  Lab 10/15/21 0325 10/16/21 0324 10/17/21 0334  NA 141 140 139  K 4.0 3.8 3.9  CL 106 107 106  CO2 23 22 20*  GLUCOSE 90 69* 77  BUN 60* 55* 49*  CREATININE 1.75* 1.74* 1.67*  CALCIUM 8.5* 8.6* 8.6*  MG 2.0 2.0 2.0  PHOS 3.4 2.8 3.0   Liver Function Tests: Recent Labs  Lab 10/15/21 0325 10/16/21 0324 10/17/21 0334  AST  --  12*  --   ALT  --  11  --   ALKPHOS  --  99  --   BILITOT  --  0.5  --   PROT  --  5.7*  --   ALBUMIN 2.5* 2.5* 2.7*   No results for input(s): LIPASE, AMYLASE in the last 168 hours. No results for input(s): AMMONIA in the last 168 hours. CBC: Recent Labs  Lab 10/15/21 0325 10/15/21 1433 10/16/21 0324 10/17/21 0334  WBC 5.4  --  5.3 5.1  HGB 7.1* 7.3* 7.4* 7.7*  HCT 23.9* 25.0* 24.4* 26.6*  MCV 80.5  --  81.1 81.3  PLT 340  --  324 247   Cardiac Enzymes: No results for input(s): CKTOTAL, CKMB, CKMBINDEX, TROPONINI in the last 168 hours. BNP: Invalid input(s): POCBNP CBG: Recent Labs  Lab 10/17/21 2357 10/18/21 0007 10/18/21 0357 10/18/21 0742 10/18/21 1147  GLUCAP 91 95 111* 100* 160*   D-Dimer No results for input(s): DDIMER in the last 72 hours. Hgb A1c No results for input(s): HGBA1C in the last 72 hours. Lipid Profile No results for input(s): CHOL, HDL, LDLCALC, TRIG, CHOLHDL, LDLDIRECT in the last 72 hours. Thyroid function studies No results for input(s): TSH, T4TOTAL, T3FREE, THYROIDAB in the last 72 hours.  Invalid input(s): FREET3 Anemia work up No results for input(s): VITAMINB12, FOLATE, FERRITIN, TIBC, IRON, RETICCTPCT in the last 72 hours. Urinalysis    Component Value Date/Time   COLORURINE YELLOW 09/12/2019 [place] 09/12/2019 0658   LABSPEC 1.012 09/12/2019 0658   PHURINE 6.0 09/12/2019 0658   GLUCOSEU >=500 (A) 09/12/2019 0658   HGBUR SMALL (A) 09/12/2019  [place] NEGATIVE 09/12/2019 [place] 09/12/2019 1027  PROTEINUR 100 (A) 09/12/2019 0658   NITRITE NEGATIVE 09/12/2019 0658   LEUKOCYTESUR SMALL (A) 09/12/2019 0658   Sepsis Labs Invalid input(s): PROCALCITONIN,  WBC,  LACTICIDVEN Microbiology Recent Results (from the past 240 hour(s))  Resp Panel by RT-PCR (Flu A&B, Covid) Nasopharyngeal Swab     Status: None   Collection Time: 10/13/21  2:35 PM   Specimen: Nasopharyngeal Swab; Nasopharyngeal(NP) swabs in vial transport medium  Result Value Ref Range Status   SARS Coronavirus 2 by RT PCR NEGATIVE NEGATIVE Final    Comment: (NOTE) SARS-CoV-2 target nucleic acids are NOT DETECTED.  The SARS-CoV-2 RNA is generally detectable in upper respiratory specimens during the acute phase of infection. The lowest concentration of SARS-CoV-2 viral copies this assay can detect is 138 copies/mL. A negative result does not preclude SARS-Cov-2 infection and should not be used as the sole basis for treatment or other patient management decisions. A negative result may occur with  improper specimen collection/handling, submission of specimen other than nasopharyngeal swab, presence of viral mutation(s) within the areas targeted by this assay, and inadequate number of viral copies(<138 copies/mL). A negative result must be combined with clinical observations, patient history, and epidemiological information. The expected result is Negative.  Fact Sheet for Patients:  EntrepreneurPulse.com.au  Fact Sheet for Healthcare Providers:  IncredibleEmployment.be  This test is no t yet approved or cleared by the [place] FDA and  has been authorized for detection and/or diagnosis of SARS-CoV-2 by FDA under an Emergency Use Authorization (EUA). This EUA will remain  in effect (meaning this test can be used) for the duration of the COVID-19 declaration under Section 564(b)(1) of the Act,  21 U.S.C.section 360bbb-3(b)(1), unless the authorization is terminated  or revoked sooner.       Influenza A by PCR NEGATIVE NEGATIVE Final   Influenza B by PCR NEGATIVE NEGATIVE Final    Comment: (NOTE) The Xpert Xpress SARS-CoV-2/FLU/RSV plus assay is intended as an aid in the diagnosis of influenza from Nasopharyngeal swab specimens and should not be used as a sole basis for treatment. Nasal washings and aspirates are unacceptable for Xpert Xpress SARS-CoV-2/FLU/RSV testing.  Fact Sheet for Patients: EntrepreneurPulse.com.au  Fact Sheet for Healthcare Providers: IncredibleEmployment.be  This test is not yet approved or cleared by the [place] FDA and has been authorized for detection and/or diagnosis of SARS-CoV-2 by FDA under an Emergency Use Authorization (EUA). This EUA will remain in effect (meaning this test can be used) for the duration of the COVID-19 declaration under Section 564(b)(1) of the Act, 21 U.S.C. section 360bbb-3(b)(1), unless the authorization is terminated or revoked.  Performed at [facility], [address], [place],  [address]      Time coordinating discharge: Over 30 minutes  SIGNED:   Nolberto Hanlon, MD  [facility] 10/21/2021, 3:11 PM Pager   If 7PM-7AM, please contact night-coverage [email or web address] Password [email or web address]

## 2021-10-18 NOTE — Plan of Care (Signed)
Problem: Clinical Measurements: Goal: Ability to maintain clinical measurements within normal limits will improve Outcome: Completed/Met Goal: Will remain free from infection Outcome: Completed/Met Goal: Diagnostic test results will improve Outcome: Completed/Met Goal: Cardiovascular complication will be avoided Outcome: Completed/Met   Problem: Activity: Goal: Risk for activity intolerance will decrease Outcome: Completed/Met   Problem: Nutrition: Goal: Adequate nutrition will be maintained Outcome: Completed/Met   Problem: Elimination: Goal: Will not experience complications related to bowel motility Outcome: Completed/Met Goal: Will not experience complications related to urinary retention Outcome: Completed/Met   Problem: Safety: Goal: Ability to remain free from injury will improve Outcome: Completed/Met   Problem: Skin Integrity: Goal: Risk for impaired skin integrity will decrease Outcome: Completed/Met   Problem: Clinical Measurements: Goal: Complications related to the disease process, condition or treatment will be avoided or minimized Outcome: Completed/Met

## 2021-10-18 NOTE — TOC Transition Note (Signed)
Transition of Care [facility]) - CM/SW Discharge Note   Patient Details  Name: Natalie Contreras MRN: [id number] Date of Birth: [birth year]  [facility] CM/SW Contact:  Tom-Johnson, Renea Ee, RN Phone Number: 10/18/2021, 10:36 AM   Clinical Narrative:     Patient is scheduled for discharge today. CM spoke with daughter, Julianne Rice and notified her of not being able to refer patient for home health PT due to her insurance and staffing availability. PT now recommending outpatient PT and [name] states she is not sure where patient will be staying at this time. States patient might be staying with her in [place] or return to her apartment in [place]. MD notified and written Prescription given to patient to take when they decide on where she will be staying. Shower chair ordered from [employer] [phone]) to deliver at bedside. Daughter to transport at discharge. No further TOC needs noted.        Patient Goals and CMS Choice        Discharge Placement                       Discharge Plan and Services                                     Social Determinants of Health (SDOH) Interventions     Readmission Risk Interventions Readmission Risk Prevention Plan 10/18/2021 10/17/2021  Transportation Screening - Complete  PCP or Specialist Appt within 3-5 Days - Complete  HRI or Home Care Consult Complete -  Social Work Consult for [place] Planning/Counseling - Complete  Palliative Care Screening - Not Applicable  Medication Review Press photographer) - Complete  Some recent data might be hidden

## 2021-10-18 NOTE — Progress Notes (Signed)
DISCHARGE NOTE HOME Natalie Contreras to be discharged Home per MD order. Discussed prescriptions and follow up appointments with the patient. Prescriptions given to patient; medication list explained in detail. Patient verbalized understanding.  Skin clean, dry and intact without evidence of skin break down, no evidence of skin tears noted. IV catheter discontinued intact. Site without signs and symptoms of complications. Dressing and pressure applied. Pt denies pain at the site currently. No complaints noted.  Patient free of lines, drains, and wounds.   An After Visit Summary (AVS) was printed and given to the patient. Patient escorted via wheelchair, and discharged home via private auto.  Arlyss Repress, RN

## 2021-10-18 NOTE — Progress Notes (Signed)
Occupational Therapy Treatment Patient Details Name: Natalie Contreras MRN: [id number] DOB: 1965/11/16 Today's Date: 10/18/2021   History of present illness Pt is a 56 yr old female who presented due to feeling weak over the last several months. CT showed multiple nodular opacities, concerning for malignancy and metastases. with intracranial masses as well, Neurosurgery and Oncology on board;  Pt HTN, DM, CHF, PAD, R BKA   OT comments  Pt making steady progress towards OT goals this session. Pt continues to present with generalized deconditioning, decreased activity tolerance and decreased ability to care for self. Pt currently requires min guard assist for ADL transfer with Rw and is MOD I for LB ADLs. Spent time discussing DC plan as pt wants to be able to go home to her house which is more accessible but she is aware she needs supervision from her daughter, discussed various options ( see general comments). Pt would continue to benefit from skilled occupational therapy while admitted  to address the below listed limitations in order to improve overall functional mobility and facilitate independence with BADL participation. DC plan remains appropriate, will follow acutely per POC.      Recommendations for follow up therapy are one component of a multi-disciplinary discharge planning process, led by the attending physician.  Recommendations may be updated based on patient status, additional functional criteria and insurance authorization.    Follow Up Recommendations  No OT follow up    Assistance Recommended at Discharge Intermittent Supervision/Assistance  Patient can return home with the following  A little help with bathing/dressing/bathroom;Assistance with cooking/housework;Assist for transportation   Equipment Recommendations  None recommended by OT    Recommendations for Other Services      Precautions / Restrictions Precautions Precautions: Fall Restrictions Weight Bearing  Restrictions: No       Mobility Bed Mobility               General bed mobility comments: sitting in recliner upon arrival and at end of session    Transfers Overall transfer level: Needs assistance Equipment used: Rolling walker (2 wheels) Transfers: Sit to/from Stand Sit to Stand: Min guard           General transfer comment: min guard to rise from recliner with initial steadying assist as pt trying to get her prosthetic to lock in     Balance Overall balance assessment: Needs assistance Sitting-balance support: Feet supported Sitting balance-Leahy Scale: Good     Standing balance support: Bilateral upper extremity supported Standing balance-Leahy Scale: Fair                             ADL either performed or assessed with clinical judgement   ADL Overall ADL's : Needs assistance/impaired                     Lower Body Dressing: Modified independent Lower Body Dressing Details (indicate cue type and reason): to don prosthetic Toilet Transfer: Min guard;Ambulation;Rolling walker (2 wheels) Toilet Transfer Details (indicate cue type and reason): simulated via functional mobility with rw         Functional mobility during ADLs: Min guard;Rolling walker (2 wheels) General ADL Comments: session focus on functional mobility and DC planning    Extremity/Trunk Assessment Upper Extremity Assessment Upper Extremity Assessment: Generalized weakness   Lower Extremity Assessment Lower Extremity Assessment: Defer to PT evaluation   Cervical / Trunk Assessment Cervical / Trunk Assessment: Kyphotic  Vision Patient Visual Report: No change from baseline     Perception Perception Perception: Within Functional Limits   Praxis Praxis Praxis: Intact    Cognition Arousal/Alertness: Awake/alert Behavior During Therapy: WFL for tasks assessed/performed Overall Cognitive Status: Within Functional Limits for tasks assessed                                           Exercises      Shoulder Instructions       General Comments discussed DC plan as pt wants to go to her house initially to "regroup" and then transition to staying with her daughter, reiterated that pt would need assistance at either location. pt reports her biggest concern is the stairs at her daughters house, pt reports she doesn't think her daughter could stay with her for an extended period of time as she works 5 days a week. pt said she is not eligible for an in home aid, recommended pt work with PT this PM to work on stairs in hope that pt is able to get to her daughters house and navgiate the stairs to have supervision at home. It may be that pt does need stair lift as indicated in previous PT note or private pay home care?    Pertinent Vitals/ Pain       Pain Assessment Pain Assessment: No/denies pain  Home Living                                          Prior Functioning/Environment              Frequency  Min 2X/week        Progress Toward Goals  OT Goals(current goals can now be found in the care plan section)  Progress towards OT goals: Progressing toward goals  Acute Rehab OT Goals Patient Stated Goal: to go home to her house OT Goal Formulation: With patient Time For Goal Achievement: 10/25/21 Potential to Achieve Goals: Good  Plan Discharge plan remains appropriate;Frequency remains appropriate    Co-evaluation                 AM-PAC OT "6 Clicks" Daily Activity     Outcome Measure   Help from another person eating meals?: None Help from another person taking care of personal grooming?: None Help from another person toileting, which includes using toliet, bedpan, or urinal?: A Little Help from another person bathing (including washing, rinsing, drying)?: A Little Help from another person to put on and taking off regular upper body clothing?: None Help from another person to put on  and taking off regular lower body clothing?: None 6 Click Score: 22    End of Session Equipment Utilized During Treatment: Gait belt;Rolling walker (2 wheels)  OT Visit Diagnosis: Unsteadiness on feet (R26.81);Other abnormalities of gait and mobility (R26.89);Muscle weakness (generalized) (M62.81)   Activity Tolerance Patient tolerated treatment well   Patient Left in chair;with call bell/phone within reach   Nurse Communication Mobility status;Other (comment) (Rn entered during session)        Time: 0812-0826 OT Time Calculation (min): 14 min  Charges: OT General Charges $OT Visit: 1 Visit OT Treatments $Self Care/Home Management : 8-22 mins  Harley Alto., COTA/L Acute Rehabilitation Services [phone]   Corinne Ports  Sandi Carne 10/18/2021, 9:14 AM

## 2021-10-19 ENCOUNTER — Ambulatory Visit (HOSPITAL_COMMUNITY)
Admission: RE | Admit: 2021-10-19 | Discharge: 2021-10-19 | Disposition: A | Discharge status: Home / Self Care | Payer: Medicaid Other | Source: Ambulatory Visit | Attending: Radiation Oncology | Admitting: Radiation Oncology

## 2021-10-19 ENCOUNTER — Other Ambulatory Visit: Payer: Self-pay

## 2021-10-19 DIAGNOSIS — C7931 Secondary malignant neoplasm of brain: Secondary | ICD-10-CM | POA: Diagnosis present

## 2021-10-19 LAB — SURGICAL PATHOLOGY

## 2021-10-19 IMAGING — MR MR HEAD WO/W CM
10 of 14 series · 22 of 48 positions shown · IV contrast (9  ML gad)
Comparison: [DATE]

CLINICAL DATA: Brain metastases, unknown primary [REDACTED] Protocol
for treatment planning

EXAM:
MRI HEAD WITHOUT AND WITH CONTRAST
TECHNIQUE: Multiplanar, multiecho pulse sequences of the brain and surrounding
structures were obtained without and with intravenous contrast.
CONTRAST:  9mL GADAVIST GADOBUTROL 1 MMOL/ML IV SOLN

[Series 2: FLAIR · sagittal · 3.0mm · 0.47mm/px · 1 of 41 slices shown (1 of 2)]
[im 1/41]
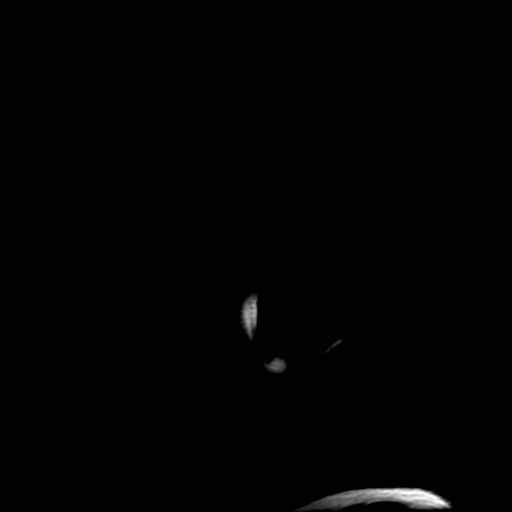

[Series 3: DWI · axial · 3.0mm · 0.94mm/px · z∈[-61,+132]mm · 3 of 134 slices shown]
[im 1/134]
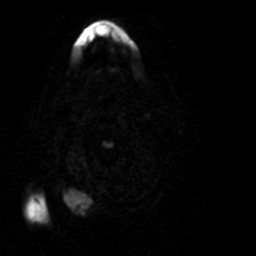
[im 67/134]
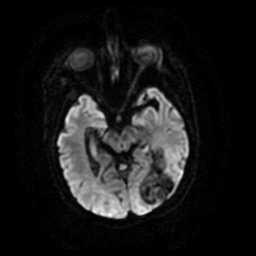
[im 134/134]
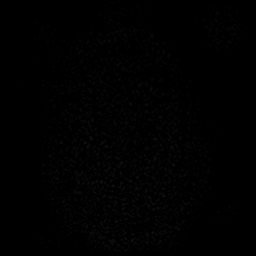

[Series 4: FLAIR · axial · 3.0mm · 0.47mm/px · z∈[-92,+112]mm · 2 of 69 slices shown (2 of 2)]
[im 1/69]
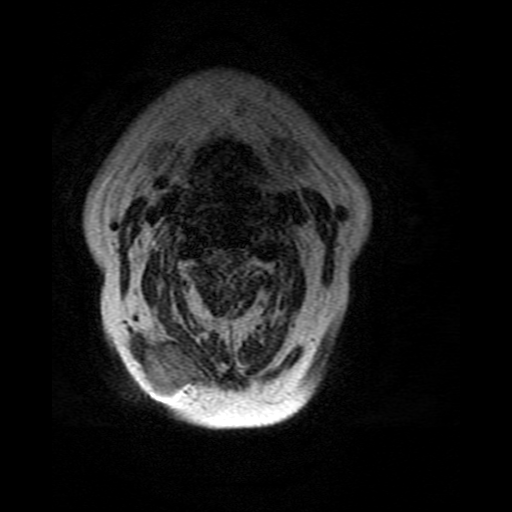
[im 69/69]
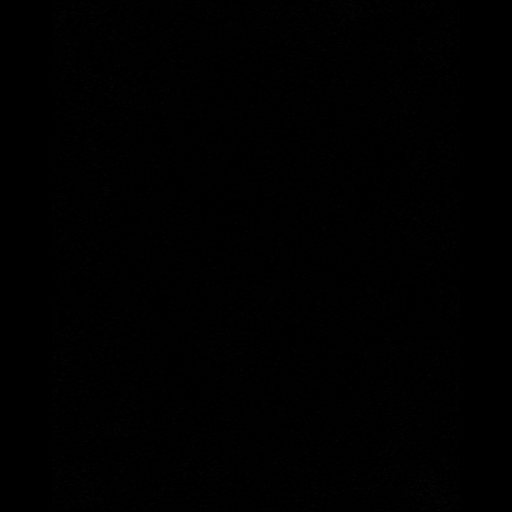

[Series 5: SWI · axial · 3.0mm · 0.47mm/px · z∈[-40,+131]mm · 3 of 116 slices shown]
[im 1/116]
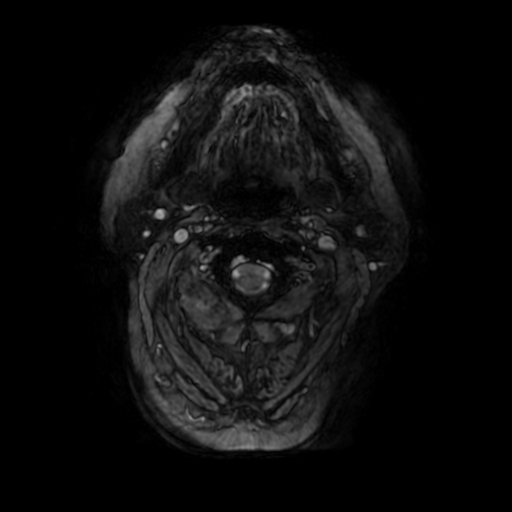
[im 58/116]
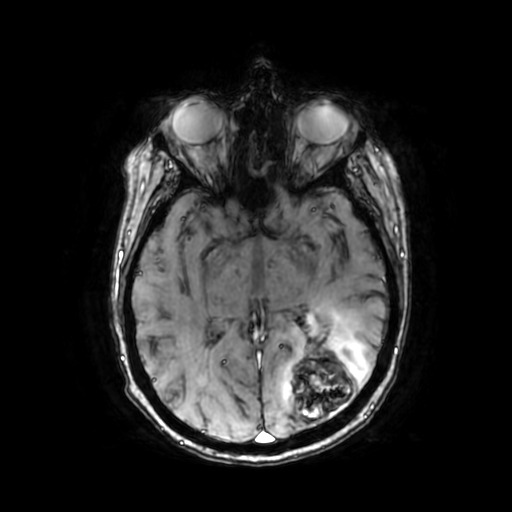
[im 116/116]
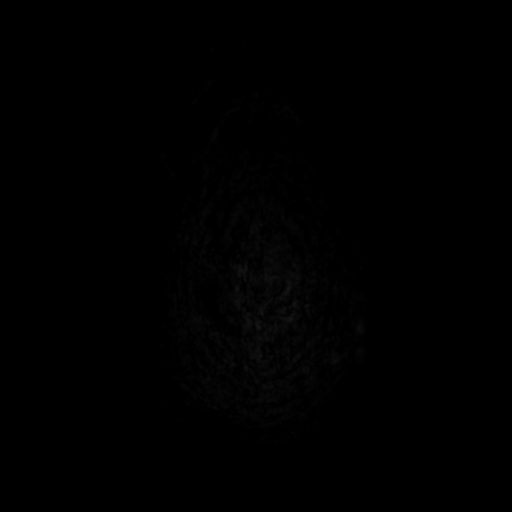

[Series 7: T2 post-contrast · coronal · 3.0mm · 0.39mm/px · 1 of 45 slices shown (1 of 2)]
[im 1/45]
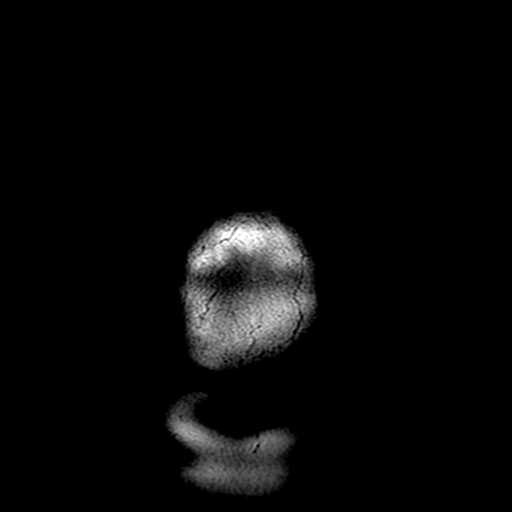

[Series 8: T2 post-contrast · axial · 5.0mm · 0.47mm/px · 1 of 32 slices shown (2 of 2)]
[im 1/32]
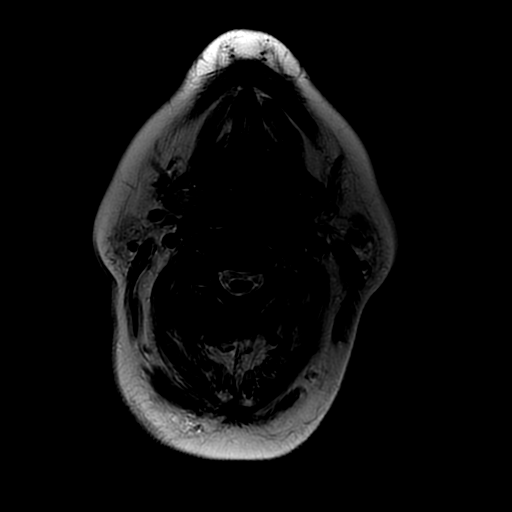

[Series 9: T1 post-contrast · coronal · 3.0mm · 0.39mm/px · 1 of 45 slices shown (1 of 2)]
[im 1/45]
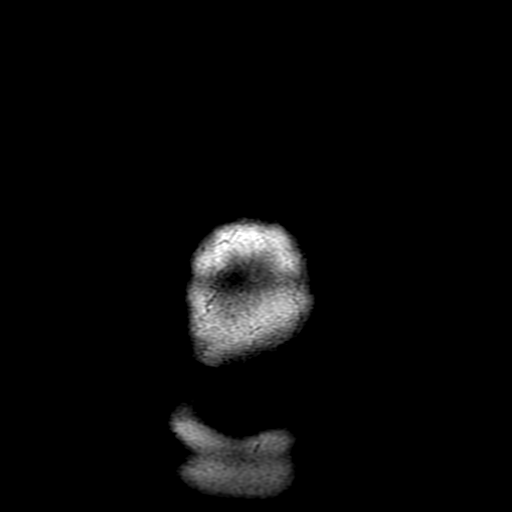

[Series 10: FLAIR post-contrast · sagittal · 3.0mm · 0.47mm/px · 1 of 41 slices shown]
[im 1/41]
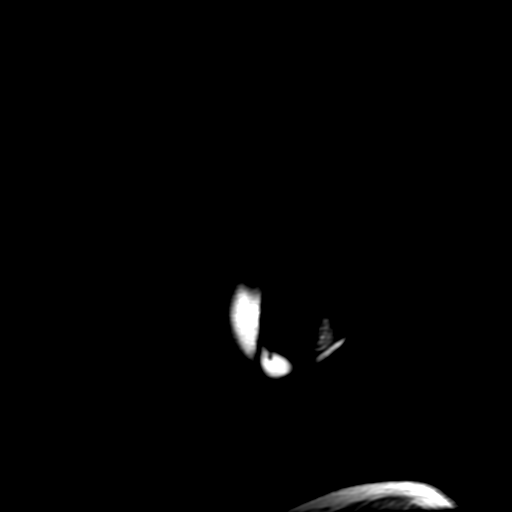

[Series 350: ADC · axial · 3.0mm · 0.94mm/px · z∈[-61,+132]mm · 2 of 67 slices shown]
[im 1/67]
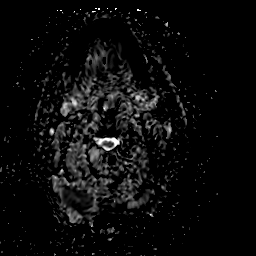
[im 67/67]
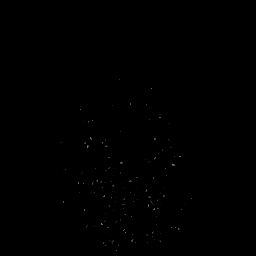

[Series 1100: T1 post-contrast · axial · 0.9mm · 0.50mm/px · z∈[-46,+190]mm · 7 of 301 slices shown (2 of 2)]
[im 1/301]
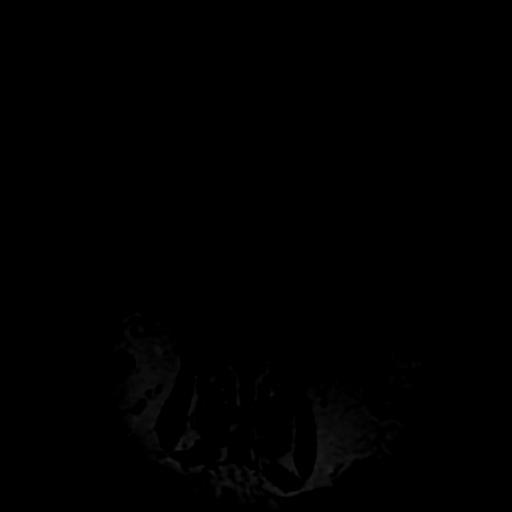
[im 51/301]
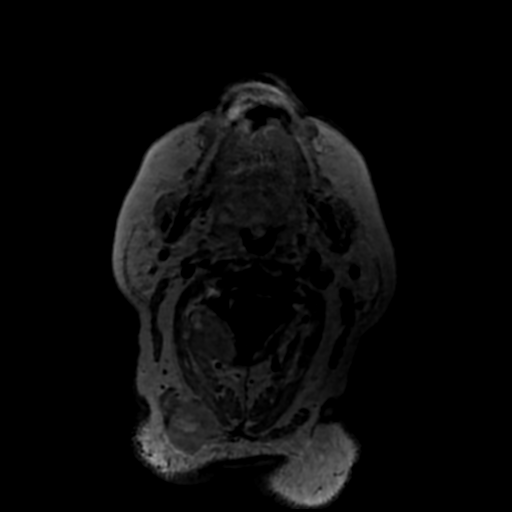
[im 101/301]
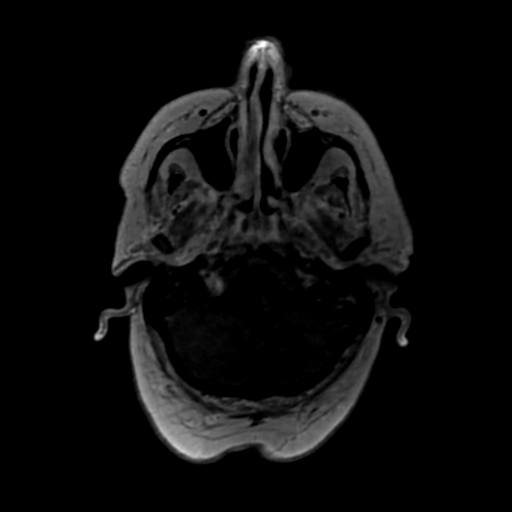
[im 151/301]
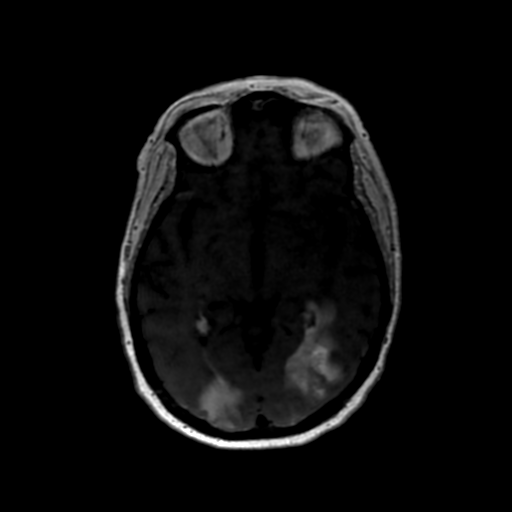
[im 201/301]
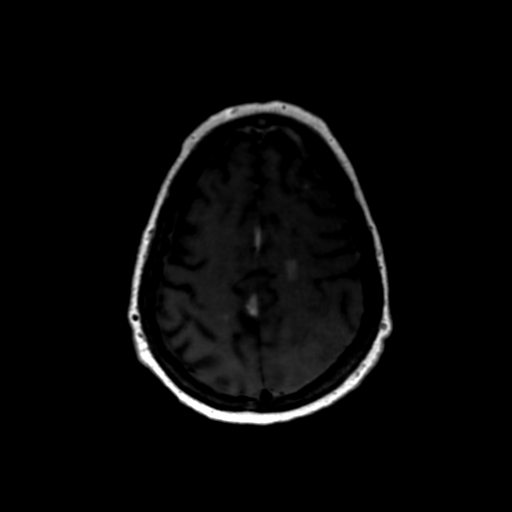
[im 251/301]
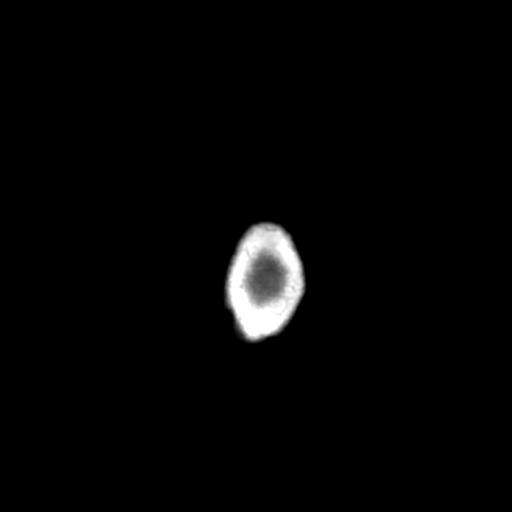
[im 301/301]
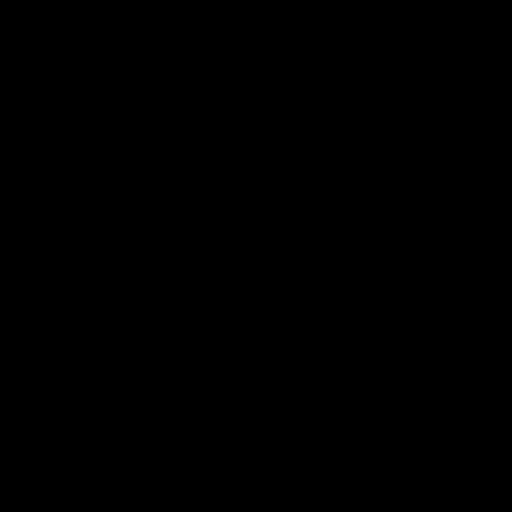

[22 of 48 positions shown; findings below may reference images not displayed]

FINDINGS: Brain: Multiple parenchymal masses are again identified. Possible
dural lesion along the falx is also again noted. In the interval,
there has been evolution of hemorrhage associated with the lesions
that now demonstrate significant intrinsic T1 shortening including
within the surrounding area of edema. Therefore, evaluation for
enhancement is now limited. Greater than 10 lesions are present.

A few were not visible on the prior study or are new. For example,
along the body of the caudate on the right (series [G4], image 178)
and several punctate lesions in the right cerebral hemisphere
(images 178, 181, 192, 197, and 202). Many of these lesions
demonstrate corresponding diffusion hyperintensity. There also
additional foci of diffusion hyperintensity in the frontoparietal
white matter bilaterally without definite corresponding enhancement.
This was also present on the prior study. The right caudate body
lesion was visible on SWI on the prior study.

There is trace subdural hemorrhage along the cerebral convexities
posteriorly. Likely minimal sulcal subarachnoid hemorrhage
associated with the dominant left parieto-occipital lesion. No
significant change in mass effect.

Vascular: Major vessel flow voids at the skull base are preserved.

Skull and upper cervical spine: Normal marrow signal is preserved.

Sinuses/Orbits: Paranasal sinuses are aerated. Orbits are
unremarkable.

Other: Sella is unremarkable.  Mastoid air cells are clear.
IMPRESSION: Multiple (greater than 10) hemorrhagic lesions are present with
evolution of intralesional hemorrhage in the interval. There is T1
hyperintensity of the perilesional edema, a finding more commonly
associated with cavernous malformations, but these are presumed to
reflect metastases in the setting of renal malignancy.

A few of the lesions were not visible on the prior study or are new.
Many of these demonstrate corresponding diffusion hyperintensity.
Therefore, subacute infarcts are consideration for this subset.

## 2021-10-19 MED ORDER — GADOBUTROL 1 MMOL/ML IV SOLN
9.0000 mL | Freq: Once | INTRAVENOUS | Status: AC | PRN
  Administered 2021-10-19: 9 mL via INTRAVENOUS

## 2021-10-19 NOTE — Progress Notes (Signed)
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 She already has a diagnosis of renal cell cancer. Both phone calls went unanswered, and patient does not have a voicemail set up for me to let her know that I called. Would you be able to keep trying to reach her to let her know?   I am Cc'ing her primary oncologist Dr. Narda Rutherford and her radiation oncologist Dr. Eppie Gibson so they are aware of these findings.

## 2021-10-22 ENCOUNTER — Inpatient Hospital Stay: Payer: Medicaid Other | Attending: Neurosurgery

## 2021-10-22 NOTE — Progress Notes (Incomplete)
Histology and Location of Primary Cancer:  Renal cell carcinoma  Location(s) of Symptomatic tumor(s):  Brain MRI w/ & w/o Contrast 10/19/2021 --IMPRESSION: Multiple (greater than 10) hemorrhagic lesions are present with evolution of intralesional hemorrhage in the interval. There is T1 hyperintensity of the perilesional edema, a finding more commonly associated with cavernous malformations, but these are presumed to reflect metastases in the setting of renal malignancy. A few of the lesions were not visible on the prior study or are new. Many of these demonstrate corresponding diffusion hyperintensity. Therefore, subacute infarcts are consideration for this subset.  Biopsies revealed:  10/17/2021 FINAL MICROSCOPIC DIAGNOSIS:  A. 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  See comment  B. STOMACH, BIOPSY:  - Gastric antral mucosa with marked nonspecific reactive gastropathy  - Helicobacter pylori-like organisms are not identified on routine HE stain  COMMENT:  Immunohistochemical stains show that the metastatic tumor cells are positive for CK AE1/AE3, CK8/18, PAX8 and CA-IX while they are negative for CK7 and CK20.  This immunoprofile is consistent with above interpretation  Past or anticipated interventions, if any, per neurosurgery:  10/16/2021 --Dr. Duffy Rhody (during hospitalization) Although her largest CNS lesion may be best controlled with combination of resection + SRS, she has significant burden of systemic, at least 8 CNS mets, and significant medical comorbidities.   As such, treating her CNS disease with radiation alone would likely give her the most palliative benefit.   I discussed with Dr.  Isidore Moos of radiation oncology-- she will likely need an outpatient 3T MRI to determine whether she is a candidate for [facility] or WBRT with hippocampal sparing.  Past or anticipated interventions, if any, per medical oncology:  Dr. Narda Rutherford (during hospitalization) 10/14/2021 If neurosurgery decides against resection of brain metastasis and urology declines nephrectomy would recommend IR guided biopsy of liver lesion to confirm diagnosis. Briefly discussed with the patient the possible treatment options for metastatic RCC.   Emphasized to the patient that immunotherapy is typically the backbone of treatment. We will defer discussion regarding prognosis until tissue biopsy is made.   If this is RCC patient would have a poor prognosis. Oncology will continue to follow.  Dose of Decadron, if applicable: ***Not currently prescribed  Recent neurologic symptoms, if any:  Seizures: *** Headaches: *** Nausea: *** Dizziness/ataxia: *** Difficulty with hand coordination: *** Focal numbness/weakness: *** Visual deficits/changes: *** Confusion/Memory deficits: ***  Ambulatory status? Walker? 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 *** Pacemaker/ICD? *** Possible current pregnancy? No--LMP: 12/16/2015 (10/17/2021 negative urine pregnancy) Is the patient on methotrexate? ***  Additional Complaints / other details: ***

## 2021-10-22 NOTE — Progress Notes (Incomplete)
Radiation Oncology         [phone] ________________________________  Follow-up New Visit   Outpatient  Name: Natalie Contreras MRN: [id number]  Date: 10/23/2021  DOB: [birth year]  FY:BOFB, Edwinna Areola, MD  Celene Squibb, MD   REFERRING PHYSICIAN: Celene Squibb, MD  DIAGNOSIS:  No diagnosis found.  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 To review from her initial consultation on 10/17/21, we discussed the need for 3T MRI imaging to determine if she is a candidate for targeted stereotactic radiosurgery or whole brain radiation. (Pathology results were also pending at the time of her initial consultation).   Stomach biopsies performed on 10/16/21 revealed metastatic carcinoma consistent with suspected RCC primary from one biopsy sample, and gastric antral mucosa with marked nonspecific reactive gastropathy from the second sample.  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  3T MRI performed on 10/19/21 demonstrated multiple (greater than 10) hemorrhagic lesions present, in addition to evolution of the intralesional hemorrhage in the interval. A T1 hyperintensity of the perilesional edema was also appreciated, which is commonly associated with cavernous malformations, but these are presumed to reflect metastases in the setting of renal malignancy. A few of the lesions were not visible on the prior study or are considered new. Many of these were noted to demonstrate corresponding diffusion hyperintensities. Therefore,  subacute infarcts were noted as a consideration for this subset.   HISTORY OF PRESENT ILLNESS::Natalie Contreras is a 56 y.o. female who presented to the ED on 10/13/21 with c/o generalized weakness x 2 months, melena, and lymphadenopathy on head, neck, chest and back. She was accordingly admitted with anemia and received 2 units PRBCs.   CT of the chest abdomen and pelvis performed on 10/13/21 demonstrated a 10 cm renal mass with invasion of the left posterior abdominal wall, concerning for primary renal cell carcinoma. Adjacent metastatic soft tissue nodules were also noted within the left perinephric space, as well as bilateral pulmonary metastases, two small masses along the capsular surface of the right kidney (noted to possibly represent synchronous primary renal cell carcinomas or metastatic disease), metastatic disease involving Gerota's fascia on the right, mild abdominal lymphadenopathy, diffuse liver metastases, small suspicious right adrenal mass, and diffuse chest and abdominal wall soft tissue metastases.  CT of the head on 10/13/21 also showed a 2.7 cm peripherally hyperdense masslike area in the left parieto-occipital lobe, with adjacent vasogenic edema. Findings were noted as concerning for hemorrhagic metastasis. Findings also indicated the likelihood of additional lesions in the bilateral cerebral hemispheres.  Biopsy appts pending, pathology is pending at this time.   MRI of the brain on 10/15/21 further revealed multiple enhancing lesions in the bilateral cerebral hemispheres, with additional extra-axial lesion along the right parietal lobe, and possible lesions in the atrium of the left lateral ventricle. Several of these lesions were noted to demonstrate associated edema and intralesional hemorrhage, including the dominant left parieto-occipital lesion. Findings were again noted as overall concerning for metastatic disease. (Additional lesions were again noted as likely present, but  obscured by the motion artifact).  I have  personally reviewed her brain and systemic imaging.   PREVIOUS RADIATION THERAPY: No  PAST MEDICAL HISTORY:  has a past medical history of Abscess of foot (96/75/9163), Acute diastolic CHF (congestive heart failure) ([place]) (10/28/2016), Carotid stenosis, asymptomatic, bilateral (01/15/2017), Diabetes mellitus without complication ([place]), Heart murmur, Hypertension, and PAD (peripheral artery disease) ([place]) (04/10/2017).    PAST SURGICAL HISTORY: Past Surgical History:  Procedure Laterality Date   ABDOMINAL AORTOGRAM W/LOWER EXTREMITY N/A 08/10/2019   Procedure: ABDOMINAL AORTOGRAM W/LOWER EXTREMITY;  Surgeon: Waynetta Sandy, MD;  Location: [facility];  Service: Cardiovascular;  Laterality: N/A;   AMPUTATION Right 09/03/2019   Procedure: RIGHT BELOW KNEE AMPUTATION;  Surgeon: Newt Minion, MD;  Location: [place] [facility];  Service: Orthopedics;  Laterality: Right;   AMPUTATION TOE Left 05/05/2017   Procedure: LEFT SECOND TOE AMPUTATION;  Surgeon: Aviva Signs, MD;  Location: [facility];  Service: General;  Laterality: Left;   BIOPSY  10/16/2021   Procedure: BIOPSY;  Surgeon: Sharyn Creamer, MD;  Location[facility];  Service: Gastroenterology;;   CATARACT EXTRACTION W/PHACO Left 02/08/2016   Procedure: CATARACT EXTRACTION PHACO AND INTRAOCULAR LENS PLACEMENT ([place]);  Surgeon: Tonny Branch, MD;  Location: [facility];  Service: Ophthalmology;  Laterality: Left;  CDE 11.43    CATARACT EXTRACTION W/PHACO Right 02/26/2016   Procedure: CATARACT EXTRACTION PHACO AND INTRAOCULAR LENS PLACEMENT RIGHT; CDE:  16.90;  Surgeon: Tonny Branch, MD;  Location: [facility];  Service: Ophthalmology;  Laterality: Right;   CHOLECYSTECTOMY     ESOPHAGOGASTRODUODENOSCOPY (EGD) WITH PROPOFOL N/A 10/16/2021   Procedure: ESOPHAGOGASTRODUODENOSCOPY (EGD) WITH PROPOFOL;  Surgeon: Sharyn Creamer, MD;  Location: [place];  Service: Gastroenterology;  Laterality: N/A;   FOREIGN BODY  REMOVAL Left 04/09/2017   Procedure: FOREIGN BODY REMOVAL ADULT FOOT;  Surgeon: Aviva Signs, MD;  Location: [facility];  Service: General;  Laterality: Left;   I & D EXTREMITY Right 08/11/2019   Procedure: IRRIGATION AND DEBRIDEMENT EXTREMITY;  Surgeon: Newt Minion, MD;  Location: [place];  Service: Orthopedics;  Laterality: Right;   PERIPHERAL VASCULAR ATHERECTOMY Right 08/10/2019   Procedure: PERIPHERAL VASCULAR ATHERECTOMY;  Surgeon: Waynetta Sandy, MD;  Location: [place] [facility];  Service: Cardiovascular;  Laterality: Right;  superficial femoral   PERIPHERAL VASCULAR BALLOON ANGIOPLASTY Right 08/10/2019   Procedure: PERIPHERAL VASCULAR BALLOON ANGIOPLASTY;  Surgeon: Waynetta Sandy, MD;  Location: [facility];  Service: Cardiovascular;  Laterality: Right;  anterior tibial   TRANSMETATARSAL AMPUTATION Left 07/21/2017   Procedure: TRANSMETATARSAL AMPUTATION LEFT FOOT;  Surgeon: Aviva Signs, MD;  Location: [facility];  Service: General;  Laterality: Left;    FAMILY HISTORY: family history includes AAA (abdominal aortic aneurysm) in her brother; Lung cancer in her father and mother.  SOCIAL HISTORY:  reports that she has been smoking cigarettes. She started smoking about 37 years ago. She has a 12.50 pack-year smoking history. She has never used smokeless tobacco. She reports that she does not drink alcohol and does not use drugs.  ALLERGIES: Patient has no known allergies.  MEDICATIONS:  Current Outpatient Medications  Medication Sig Dispense Refill   acetaminophen (TYLENOL) 325 MG tablet Take 1-2 tablets (325-650 mg total) by mouth every 4 (four) hours as needed for mild pain.     carvedilol (COREG) 3.125 MG tablet TAKE (1) TABLET BY MOUTH TWICE DAILY WITH A MEAL. (Patient taking differently: Take 3.125 mg by mouth 2 (two) times daily with a meal.) 60 tablet 0   Continuous Blood Gluc Sensor (  DEXCOM G6 SENSOR) MISC SMARTSIG:1 Each Topical Every 10 Days     diazepam  (VALIUM) 5 MG tablet Take 1 tablet (5 mg total) by mouth every 8 (eight) hours as needed for up to 1 dose for anxiety. 1 tablet 0   docusate sodium (COLACE) 100 MG capsule Take 1 capsule (100 mg total) by mouth 2 (two) times daily. (Patient not taking: Reported on 10/13/2021) 60 capsule 0   ergocalciferol (VITAMIN D2) 1.25 MG (50000 UT) capsule Take 50,000 Units by mouth once a week. Sunday     insulin aspart (NOVOLOG) 100 UNIT/ML injection Up to 100 units per day in omni pod. Omnipod to be filled with Novolog to be accurately injected by Omnipod based on settings.     JARDIANCE 25 MG TABS tablet Take 25 mg by mouth daily.     Multiple Vitamin (MULTIVITAMIN WITH MINERALS) TABS tablet Take 1 tablet by mouth daily. (Patient not taking: Reported on 10/13/2021)     pantoprazole (PROTONIX) 40 MG tablet Take 1 tablet (40 mg total) by mouth 2 (two) times daily. 60 tablet 0   polyethylene glycol (MIRALAX / GLYCOLAX) 17 g packet Take 17 g by mouth 2 (two) times daily. (Patient not taking: Reported on 10/13/2021) 60 each 0   simvastatin (ZOCOR) 40 MG tablet Take 40 mg by mouth at bedtime.      sucralfate (CARAFATE) 1 GM/10ML suspension Take 10 mLs (1 g total) by mouth 4 (four) times daily -  with meals and at bedtime. 1200 mL 0   No current facility-administered medications for this encounter.    REVIEW OF SYSTEMS:  Notable for that above.   PHYSICAL EXAM:  vitals were not taken for this visit.   General: Alert and oriented, in no acute distress  ***  LABORATORY DATA:  Lab Results  Component Value Date   WBC 5.1 10/17/2021   HGB 7.7 (L) 10/17/2021   HCT 26.6 (L) 10/17/2021   MCV 81.3 10/17/2021   PLT 247 10/17/2021   CMP     Component Value Date/Time   NA 139 10/17/2021 0334   K 3.9 10/17/2021 0334   CL 106 10/17/2021 0334   CO2 20 (L) 10/17/2021 0334   GLUCOSE 77 10/17/2021 0334   BUN 49 (H) 10/17/2021 0334   CREATININE 1.67 (H) 10/17/2021 0334   CREATININE 0.83 03/04/2017 1637   CALCIUM  8.6 (L) 10/17/2021 0334   PROT 5.7 (L) 10/16/2021 0324   ALBUMIN 2.7 (L) 10/17/2021 0334   AST 12 (L) 10/16/2021 0324   ALT 11 10/16/2021 0324   ALKPHOS 99 10/16/2021 0324   BILITOT 0.5 10/16/2021 0324   GFRNONAA 36 (L) 10/17/2021 0334   GFRAA >60 09/13/2019 0835         RADIOGRAPHY: DG Chest 2 View  Result Date: 10/13/2021 CLINICAL DATA:  smoker. weakness. rule out infection/malignancy EXAM: CHEST - 2 VIEW COMPARISON:  April 07, 2017 FINDINGS: The cardiomediastinal silhouette is enlarged in contour.Atherosclerotic calcifications. No pleural effusion. No pneumothorax. Multiple nodular opacities throughout bilateral lungs. Surgical clips project over the upper abdomen. Suggestion of sclerosis of several vertebral bodies. IMPRESSION: 1. Multiple nodular opacities bilaterally. Suggestion of multifocal sclerosis of the vertebral bodies versus summation artifact. Findings are concerning for multifocal metastases. Recommend further dedicated evaluation with cross-sectional imaging. Electronically Signed   By: Valentino Saxon M.D.   On: 10/13/2021 15:29   CT Head Wo Contrast  Result Date: 10/13/2021 CLINICAL DATA:  dizziness. blurred vision. EXAM: CT HEAD WITHOUT CONTRAST TECHNIQUE: Contiguous  axial images were obtained from the base of the skull through the vertex without intravenous contrast. RADIATION DOSE REDUCTION: This exam was performed according to the departmental dose-optimization program which includes automated exposure control, adjustment of the mA and/or kV according to patient size and/or use of iterative reconstruction technique. COMPARISON:  February 17, 2017. FINDINGS: Brain: There is a lesion in the LEFT parieto-occipital lobe with adjacent vasogenic edema and a peripheral rim of intrinsic hyperdensity. It measures 2.6 x 2.4 by 2.7 cm. There is an additional intrinsically hyperdense region along the RIGHT frontoparietal falx which measures 7 by 5 mm which is concerning for an additional  site of disease (series 2, image 23). Hyperdensity of the LEFT frontal lobe measuring approximately 5 mm likely reflects an additional site of disease (series 2, image 25). Hypodensity of the RIGHT occipital lobe with a small amount of intrinsic hyperdensity likely reflecting additional lesion (series 2, image 12; series 5, image 28). Hypodensity of the LEFT inferior cerebellum, consistent with the sequela of remote prior infarction and similar comparison to prior. No significant midline shift. Vascular: Vascular calcifications of the carotid siphons. Skull: No acute fracture. Sinuses/Orbits: LEFT mastoid effusion. Other: None. IMPRESSION: 1. There is a 2.7 cm peripherally hyperdense masslike area in the LEFT parieto-occipital lobe with adjacent vasogenic edema. 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 Recommend further evaluation with dedicated brain MRI with and without contrast. 2. There are likely additional lesions in the bilateral cerebral hemispheres. These results were called by telephone at the time of interpretation on 10/13/2021 at 3:20 pm to provider MARGAUX VENTER , who verbally acknowledged these results. Electronically Signed   By: Valentino Saxon M.D.   On: 10/13/2021 15:28   MR Brain W Wo Contrast  Result Date: 10/20/2021 CLINICAL DATA:  Brain metastases, unknown primary 3T SRS Protocol for treatment planning EXAM: MRI HEAD WITHOUT AND WITH CONTRAST TECHNIQUE: Multiplanar, multiecho pulse sequences of the brain and surrounding structures were obtained without and with intravenous contrast. CONTRAST:  57mL GADAVIST GADOBUTROL 1 MMOL/ML IV SOLN COMPARISON:  10/15/2021 FINDINGS: Brain: Multiple parenchymal masses are again identified. Possible dural lesion along the falx is also again noted. In the interval, there has been evolution of hemorrhage associated with the lesions that now demonstrate significant intrinsic T1 shortening including within the surrounding area of edema. Therefore,  evaluation for enhancement is now limited. Greater than 10 lesions are present. A few were not visible on the prior study or are new. For example, along the body of the caudate on the right (series 1100, image 178) and several punctate lesions in the right cerebral hemisphere (images 178, 181, 192, 197, and 202). Many of these lesions demonstrate corresponding diffusion hyperintensity. There also additional foci of diffusion hyperintensity in the frontoparietal white matter bilaterally without definite corresponding enhancement. This was also present on the prior study. The right caudate body lesion was visible on SWI on the prior study. There is trace subdural hemorrhage along the cerebral convexities posteriorly. Likely minimal sulcal subarachnoid hemorrhage associated with the dominant left parieto-occipital lesion. No significant change in mass effect. Vascular: Major vessel flow voids at the skull base are preserved. Skull and upper cervical spine: Normal marrow signal is preserved. Sinuses/Orbits: Paranasal sinuses are aerated. Orbits are unremarkable. Other: Sella is unremarkable.  Mastoid air cells are clear. IMPRESSION: Multiple (greater than 10) hemorrhagic lesions are present with evolution of intralesional hemorrhage in the interval. There is T1 hyperintensity of the perilesional edema, a finding more commonly associated with cavernous malformations,  but these are presumed to reflect metastases in the setting of renal malignancy. A few of the lesions were not visible on the prior study or are new. Many of these demonstrate corresponding diffusion hyperintensity. Therefore, subacute infarcts are consideration for this subset. Electronically Signed   By: Macy Mis M.D.   On: 10/20/2021 14:05   MR BRAIN W WO CONTRAST  Result Date: 10/16/2021 CLINICAL DATA:  CNS neoplasm, [facility] protocol EXAM: MRI HEAD WITHOUT AND WITH CONTRAST TECHNIQUE: Multiplanar, multiecho pulse sequences of the brain and  surrounding structures were obtained without and with intravenous contrast. CONTRAST:  87mL GADAVIST GADOBUTROL 1 MMOL/ML IV SOLN COMPARISON:  No prior MRI, correlation is made with 10/13/2021 CT head FINDINGS: Evaluation is limited by motion artifact. Brain: Multiple enhancing masses in the bilateral cerebral hemispheres, the largest of which is in the left parietooccipital lobe and measures up to 3.3 x 2.9 x 3.3 cm (AP x TR x CC) (series 9, image 92). Additional smaller lesions include a left parietal lesion that measures 0.7 x 0.5 x 0.7 cm (series 9, image 39 and series 10, image 29), a left frontal lobe lesion that measures 0.6 x 0.4 x 0.6 cm (series 9, image 139 and series 10, image 43), a right frontal lobe lesion that measures 0.3 x 0.4 x 0.4 cm (series 9, image 50 and series 10, image 52), a right extra-axial lesion along the falx adjacent to the right parietal lobe that measures 1.0 x 0.5 x 1.0 cm (series 9, image 60 and series 10, image 23), right parietal lesion that measures 1.0 x 0.9 x 1.0 cm (series 9, image 80 and series 10, image 20), a left parietal lesion that measures 0.4 x 0.8 x 0.5 cm (series 9, image 82 and series 10, image 22), a left occipital lesion that measures 0.5 x 0.8 x 0.6 cm (series 9, image 124 and series 10, image 8), and a right occipital lesion (or possibly 2 adjacent lesions) that measures 1.2 x 1.7 x 0.9 cm (series 9, image 128 and series 10, image 8). Possible additional lesions in the right frontal lobe adjacent to the right lateral ventricle, which is faintly visualized on the coronal postcontrast sequences (series 10, image 32), measuring 0.6 x 0.5 cm, and in the atrium of the right lateral ventricle (series 11, image 26), measuring approximately 0.4 cm, best seen on the sagittal sequence. These lesions are associated with surrounding T2 hyperintense signal, likely edema, with the largest area of edema surrounding the dominant left parieto-occipital lesion. Several of these  lesions are also associated with hemosiderin deposition (series 6, image 54, 76, 81, and 82). Two rounded areas of hemosiderin deposition in the left lateral ventricle atrium (series 6, image 76 and 69) are difficult to find definite enhancing lesions to correlate with on the axial sequences, given enhancement in the choroid plexus, but possible correlates are suspected on the sagittal postcontrast sequence (series 11, image 24 and 26). No other areas of parenchymal hemorrhage. No definite midline shift. 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 No hydrocephalus or extra-axial collection. No diffusion-weighted sequence was available to evaluate for infarct given the selected protocol. Vascular: The left PCA flow void is not well visualized, however the vessel is seen on postcontrast imaging. Otherwise normal flow voids. Skull and upper cervical spine: Normal marrow signal. Sinuses/Orbits: Status post bilateral lens replacements. Otherwise negative. Other: Trace fluid in the mastoid air cells. IMPRESSION: Evaluation is limited by motion artifact. Within this limitation,  multiple enhancing lesions in the bilateral cerebral hemispheres, with additional extra-axial lesion along the right parietal lobe and possible lesions in the atrium of the left lateral ventricle. Several of these lesions demonstrate associated edema and intralesional hemorrhage, including the dominant left parieto-occipital lesion. These are overall concerning for metastatic disease. Additional lesions may be present, but obscured by the motion artifact. Electronically Signed   By: Merilyn Baba M.D.   On: 10/16/2021 02:05   CT CHEST ABDOMEN PELVIS W CONTRAST  Result Date: 10/13/2021 CLINICAL DATA:  Brain metastasis of unknown primary. EXAM: CT CHEST, ABDOMEN, AND PELVIS WITH CONTRAST TECHNIQUE: Multidetector CT imaging of the chest, abdomen and pelvis was performed following the standard protocol during bolus  administration of intravenous contrast. RADIATION DOSE REDUCTION: This exam was performed according to the departmental dose-optimization program which includes automated exposure control, adjustment of the mA and/or kV according to patient size and/or use of iterative reconstruction technique. CONTRAST:  7mL OMNIPAQUE IOHEXOL 300 MG/ML  SOLN COMPARISON:  None. FINDINGS: CT CHEST FINDINGS Cardiovascular: Mild cardiomegaly. Aortic and coronary atherosclerotic calcification noted. Mediastinum/Lymph Nodes: Mild bilateral hilar lymphadenopathy is seen. No pathologically enlarged mediastinal or axillary lymph nodes are identified. Lungs/Pleura: Multiple solid bilateral pulmonary nodules are seen, consistent with diffuse pulmonary metastases. Index nodule in the medial right lower lobe measures 2.9 x 1.7 cm on image 92/3. Index nodule in the posterolateral left lower lobe measures 4.4 x 2.2 cm on image 109/3. Small left pleural effusion also seen. Musculoskeletal: Multiple chest wall soft tissue masses are seen bilaterally, involving the breasts, chest wall muscles and subcutaneous tissues. The 2 largest masses involve the right pectoralis major muscle, measuring 3.3 x 2.9 cm on image 12/2, and the right supraspinatus muscle measuring 5.7 x 3.2 cm on image 21/2. CT ABDOMEN AND PELVIS FINDINGS Hepatobiliary: Multiple hypovascular masses are seen throughout the right and left hepatic lobes, largest in the lateral dome of the right hepatic lobe measuring 10.7 x 5.1 cm. These are consistent diffuse liver metastases. Prior cholecystectomy. No evidence of biliary obstruction. Pancreas:  No mass or inflammatory changes. Spleen:  Within normal limits in size and appearance. Adrenals/Urinary tract: A small right adrenal mass is seen measuring 2.0 x 1.5 cm, which is indeterminate but suspicious for adrenal metastasis. A large heterogeneously enhancing mass is seen involving the posterior left kidney, which shows invasion of the  posterior abdominal wall soft tissues. This measures 10.3 x 8.9 cm, and is consistent with primary renal cell carcinoma. Smaller enhancing soft tissue nodules are also seen in the left posterior pararenal space, consistent with metastatic disease. Two small masses are seen along the capsular surface of the anterior mid and lower poles of the right kidney, which measure 2.2 cm and 1.5 cm in diameter. These could represent synchronous primary renal cell carcinomas of the right kidney, or metastatic disease. Two soft tissue nodules are involve the lateral portion of Gerota's fascia on the right, largest measuring 2.2 x 1.6 cm. A subcapsular cyst measuring 4 cm is also seen in the lateral interpolar region of the right kidney. Stomach/Bowel: No evidence of obstruction, inflammatory process, or abnormal fluid collections. Vascular/Lymphatic: Mild lymphadenopathy is seen in the left paraaortic region, largest measuring 10 mm on image 67/2. A 1.3 cm lymph node is seen in the deep left abdominal small bowel mesentery adjacent to the duodenum. No pelvic lymphadenopathy identified. Aortic atherosclerotic calcification noted. Left retroaortic renal vein is noted, however there is no evidence of renal vein or IVC thrombus. Reproductive:  No masses identified. Small amount of gas noted within the endometrial cavity uterus. Other:  None. Musculoskeletal: No suspicious bone lesions identified. Multiple small masses are seen scattered throughout the abdominal wall soft tissues, consistent with metastatic disease. IMPRESSION: 10 cm left renal mass with invasion of the left posterior abdominal wall soft tissues, consistent with primary renal cell carcinoma. Adjacent metastatic soft tissue nodules also noted within the left perinephric space. Diffuse bilateral pulmonary metastases. Two small masses along the capsular surface of the right kidney, which may represent synchronous primary renal cell carcinomas or metastatic disease.  Metastatic disease also involving Gerota's fascia on the right. Mild abdominal lymphadenopathy, consistent with metastatic disease. Diffuse liver metastases. Small right adrenal mass, suspicious for metastatic disease. Diffuse bilateral pulmonary metastases. 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 Electronically Signed   By: Marlaine Hind M.D.   On: 10/13/2021 17:53   [place] CORE BIOPSY (SOFT TISSUE)  Result Date: 10/17/2021 INDICATION: 56 year old female with history of left renal mass and multiple diffuse metastatic lesions. EXAM: Ultrasound-guided soft tissue mass biopsy MEDICATIONS: None. ANESTHESIA/SEDATION: Moderate (conscious) sedation was employed during this procedure. A total of Versed 1 mg and Fentanyl 25 mcg was administered intravenously. Moderate Sedation Time: 8 minutes. The patient's level of consciousness and vital signs were monitored continuously by radiology nursing throughout the procedure under my direct supervision. FLUOROSCOPY TIME:  None. COMPLICATIONS: None immediate. PROCEDURE: Informed written consent was obtained from the patient after a thorough discussion of the procedural risks, benefits and alternatives. All questions were addressed. Maximal Sterile Barrier Technique was utilized including caps, mask, sterile gowns, sterile gloves, sterile drape, hand hygiene and skin antiseptic. A timeout was performed prior to the initiation of the procedure. Preprocedure ultrasound evaluation of the right posterior flank demonstrates subcutaneous hypoechoic soft tissue nodule measuring approximately 2.0 x 1.0 cm. Procedure was planned. Subdermal Local anesthesia was provided at the planned needle entry site. Deeper local anesthetic was administered under ultrasound guidance to the periphery of the targeted mass. A small skin nick was made. Under direct ultrasound visualization, a 17 gauge introducer needle was directed to the periphery of the  mass. Next, a total of 3, 18 gauge core biopsies were obtained and placed in formalin. The introducer needle was removed. Postprocedure image demonstrated no evidence of surrounding hematoma or other procedure related complication. The patient tolerated the procedure well and was transferred back to the floor in stable condition. IMPRESSION: Technically successful ultrasound-guided core needle biopsy of right posterior flank subcutaneous soft tissue mass. Ruthann Cancer, MD [facility] Radiology Electronically Signed   By: Ruthann Cancer M.D.   On: 10/17/2021 12:02      IMPRESSION/PLAN: This is a very pleasant 56 year old *** with metastatic disease to the brain.  I had a lengthy discussion with the patient after reviewing their MRI results with them.  We spoke about whole brain radiotherapy versus stereotactic radiosurgery to the brain. We spoke about the differing risks benefits and side effects of both of these treatments. During part of our discussion, we spoke about the hair loss, fatigue and cognitive effects that can result from whole brain radiotherapy.  Additionally, we spoke about radionecrosis that can result from stereotactic radiosurgery. I explained that whole brain radiotherapy is more comprehensive and therefore can decrease the chance of recurrences elsewhere in the brain, while stereotactic radiosurgery only treats the areas of gross disease while sparing the rest of the brain parenchyma.  After lengthy discussion, the patient would like to proceed with stereotactic brain radiosurgery to their metastatic disease. They will meet with neurosurgery in the near future to discuss this further; a neurosurgeon will participate in their case.  CT simulation will take place on *** and treatment on ***.  I plan to deliver *** Gy in 1 fraction to ***.  On date of service, in total, I spent *** minutes on this encounter. Patient was seen in  person.  __________________________________________   Eppie Gibson, MD  This document serves as a record of services personally performed by Eppie Gibson, MD. It was created on her behalf by Roney Mans, a trained medical scribe. The creation of this record is based on the scribe's personal observations and the provider's statements to them. This document has been checked and approved by the attending provider.

## 2021-10-23 ENCOUNTER — Encounter (HOSPITAL_COMMUNITY): Payer: Self-pay | Admitting: Emergency Medicine

## 2021-10-23 ENCOUNTER — Emergency Department (HOSPITAL_COMMUNITY): Payer: Medicaid Other

## 2021-10-23 ENCOUNTER — Ambulatory Visit: Payer: Medicaid Other | Admitting: Radiation Oncology

## 2021-10-23 ENCOUNTER — Inpatient Hospital Stay (HOSPITAL_COMMUNITY)
Admission: EM | Admit: 2021-10-23 | Discharge: 2021-10-30 | DRG: 054 | Disposition: A | Discharge status: Home / Self Care | Payer: Medicaid Other | Source: Ambulatory Visit | Attending: Internal Medicine | Admitting: Internal Medicine

## 2021-10-23 ENCOUNTER — Telehealth: Payer: Self-pay | Admitting: Radiation Therapy

## 2021-10-23 ENCOUNTER — Ambulatory Visit: Payer: Medicaid Other

## 2021-10-23 ENCOUNTER — Ambulatory Visit
Admission: RE | Admit: 2021-10-23 | Discharge: 2021-10-23 | Disposition: A | Discharge status: Home / Self Care | Payer: Medicaid Other | Source: Ambulatory Visit | Attending: Radiation Oncology | Admitting: Radiation Oncology

## 2021-10-23 DIAGNOSIS — F17210 Nicotine dependence, cigarettes, uncomplicated: Secondary | ICD-10-CM | POA: Diagnosis present

## 2021-10-23 DIAGNOSIS — C649 Malignant neoplasm of unspecified kidney, except renal pelvis: Secondary | ICD-10-CM | POA: Diagnosis present

## 2021-10-23 DIAGNOSIS — Z7984 Long term (current) use of oral hypoglycemic drugs: Secondary | ICD-10-CM

## 2021-10-23 DIAGNOSIS — Z9641 Presence of insulin pump (external) (internal): Secondary | ICD-10-CM | POA: Diagnosis present

## 2021-10-23 DIAGNOSIS — I5033 Acute on chronic diastolic (congestive) heart failure: Secondary | ICD-10-CM | POA: Diagnosis present

## 2021-10-23 DIAGNOSIS — Z89432 Acquired absence of left foot: Secondary | ICD-10-CM

## 2021-10-23 DIAGNOSIS — Z20822 Contact with and (suspected) exposure to covid-19: Secondary | ICD-10-CM | POA: Diagnosis present

## 2021-10-23 DIAGNOSIS — R531 Weakness: Secondary | ICD-10-CM

## 2021-10-23 DIAGNOSIS — Z801 Family history of malignant neoplasm of trachea, bronchus and lung: Secondary | ICD-10-CM

## 2021-10-23 DIAGNOSIS — C799 Secondary malignant neoplasm of unspecified site: Secondary | ICD-10-CM

## 2021-10-23 DIAGNOSIS — G936 Cerebral edema: Secondary | ICD-10-CM | POA: Diagnosis present

## 2021-10-23 DIAGNOSIS — E875 Hyperkalemia: Secondary | ICD-10-CM

## 2021-10-23 DIAGNOSIS — R262 Difficulty in walking, not elsewhere classified: Secondary | ICD-10-CM | POA: Diagnosis present

## 2021-10-23 DIAGNOSIS — D62 Acute posthemorrhagic anemia: Secondary | ICD-10-CM | POA: Diagnosis present

## 2021-10-23 DIAGNOSIS — Z9049 Acquired absence of other specified parts of digestive tract: Secondary | ICD-10-CM

## 2021-10-23 DIAGNOSIS — M7989 Other specified soft tissue disorders: Secondary | ICD-10-CM

## 2021-10-23 DIAGNOSIS — C7901 Secondary malignant neoplasm of right kidney and renal pelvis: Secondary | ICD-10-CM | POA: Diagnosis present

## 2021-10-23 DIAGNOSIS — C7889 Secondary malignant neoplasm of other digestive organs: Secondary | ICD-10-CM | POA: Diagnosis present

## 2021-10-23 DIAGNOSIS — R600 Localized edema: Secondary | ICD-10-CM

## 2021-10-23 DIAGNOSIS — E1140 Type 2 diabetes mellitus with diabetic neuropathy, unspecified: Secondary | ICD-10-CM | POA: Diagnosis present

## 2021-10-23 DIAGNOSIS — C642 Malignant neoplasm of left kidney, except renal pelvis: Secondary | ICD-10-CM | POA: Diagnosis present

## 2021-10-23 DIAGNOSIS — I619 Nontraumatic intracerebral hemorrhage, unspecified: Secondary | ICD-10-CM | POA: Diagnosis present

## 2021-10-23 DIAGNOSIS — Z79899 Other long term (current) drug therapy: Secondary | ICD-10-CM

## 2021-10-23 DIAGNOSIS — E1151 Type 2 diabetes mellitus with diabetic peripheral angiopathy without gangrene: Secondary | ICD-10-CM | POA: Diagnosis present

## 2021-10-23 DIAGNOSIS — Z794 Long term (current) use of insulin: Secondary | ICD-10-CM

## 2021-10-23 DIAGNOSIS — E119 Type 2 diabetes mellitus without complications: Secondary | ICD-10-CM

## 2021-10-23 DIAGNOSIS — Z89511 Acquired absence of right leg below knee: Secondary | ICD-10-CM

## 2021-10-23 DIAGNOSIS — I110 Hypertensive heart disease with heart failure: Secondary | ICD-10-CM | POA: Diagnosis present

## 2021-10-23 DIAGNOSIS — I6523 Occlusion and stenosis of bilateral carotid arteries: Secondary | ICD-10-CM | POA: Diagnosis present

## 2021-10-23 DIAGNOSIS — E872 Acidosis, unspecified: Secondary | ICD-10-CM | POA: Diagnosis present

## 2021-10-23 DIAGNOSIS — R609 Edema, unspecified: Secondary | ICD-10-CM

## 2021-10-23 DIAGNOSIS — I5032 Chronic diastolic (congestive) heart failure: Secondary | ICD-10-CM | POA: Diagnosis present

## 2021-10-23 DIAGNOSIS — C7931 Secondary malignant neoplasm of brain: Principal | ICD-10-CM | POA: Diagnosis present

## 2021-10-23 DIAGNOSIS — I10 Essential (primary) hypertension: Secondary | ICD-10-CM | POA: Diagnosis present

## 2021-10-23 LAB — CBC WITH DIFFERENTIAL/PLATELET
Abs Immature Granulocytes: 0.02 10*3/uL (ref 0.00–0.07)
Basophils Absolute: 0 10*3/uL (ref 0.0–0.1)
Basophils Relative: 1 %
Eosinophils Absolute: 0.1 10*3/uL (ref 0.0–0.5)
Eosinophils Relative: 2 %
HCT: 30.1 % — ABNORMAL LOW (ref 36.0–46.0)
Hemoglobin: 8.6 g/dL — ABNORMAL LOW (ref 12.0–15.0)
Immature Granulocytes: 0 %
Lymphocytes Relative: 19 %
Lymphs Abs: 1.1 10*3/uL (ref 0.7–4.0)
MCH: 24.4 pg — ABNORMAL LOW (ref 26.0–34.0)
MCHC: 28.6 g/dL — ABNORMAL LOW (ref 30.0–36.0)
MCV: 85.3 fL (ref 80.0–100.0)
Monocytes Absolute: 0.4 10*3/uL (ref 0.1–1.0)
Monocytes Relative: 7 %
Neutro Abs: 4 10*3/uL (ref 1.7–7.7)
Neutrophils Relative %: 71 %
Platelets: 394 10*3/uL (ref 150–400)
RBC: 3.53 MIL/uL — ABNORMAL LOW (ref 3.87–5.11)
RDW: 20.4 % — ABNORMAL HIGH (ref 11.5–15.5)
WBC: 5.7 10*3/uL (ref 4.0–10.5)
nRBC: 0 % (ref 0.0–0.2)

## 2021-10-23 LAB — COMPREHENSIVE METABOLIC PANEL
ALT: 14 U/L (ref 0–44)
AST: 13 U/L — ABNORMAL LOW (ref 15–41)
Albumin: 3.1 g/dL — ABNORMAL LOW (ref 3.5–5.0)
Alkaline Phosphatase: 140 U/L — ABNORMAL HIGH (ref 38–126)
Anion gap: 10 (ref 5–15)
BUN: 34 mg/dL — ABNORMAL HIGH (ref 6–20)
CO2: 22 mmol/L (ref 22–32)
Calcium: 8.9 mg/dL (ref 8.9–10.3)
Chloride: 107 mmol/L (ref 98–111)
Creatinine, Ser: 1.19 mg/dL — ABNORMAL HIGH (ref 0.44–1.00)
GFR, Estimated: 54 mL/min — ABNORMAL LOW (ref >60)
Glucose, Bld: 158 mg/dL — ABNORMAL HIGH (ref 70–99)
Potassium: 3.1 mmol/L — ABNORMAL LOW (ref 3.5–5.1)
Sodium: 139 mmol/L (ref 135–145)
Total Bilirubin: 0.5 mg/dL (ref 0.3–1.2)
Total Protein: 7.1 g/dL (ref 6.5–8.1)

## 2021-10-23 LAB — BRAIN NATRIURETIC PEPTIDE: B Natriuretic Peptide: 353.4 pg/mL — ABNORMAL HIGH (ref 0.0–100.0)

## 2021-10-23 MED ORDER — DEXAMETHASONE SODIUM PHOSPHATE 10 MG/ML IJ SOLN
10.0000 mg | Freq: Once | INTRAMUSCULAR | Status: AC
  Administered 2021-10-23: 10 mg via INTRAVENOUS
  Filled 2021-10-23: qty 1

## 2021-10-23 MED ORDER — FUROSEMIDE 10 MG/ML IJ SOLN
40.0000 mg | Freq: Once | INTRAMUSCULAR | Status: AC
  Administered 2021-10-23: 40 mg via INTRAVENOUS
  Filled 2021-10-23: qty 4

## 2021-10-23 NOTE — ED Provider Triage Note (Signed)
Emergency Medicine Provider Triage Evaluation Note  Natalie Contreras , a 56 y.o. female  was evaluated in triage.  Pt complains of leg pain and swelling. She was supposed to have a appointment at the cancer center today, and when they called because she was late and not diarrhea she stated that she was unable to put on her prosthetic or stand/pivot due to swelling causing pain in her leg.  She has a history of this.  She states that this episode is not different than prior.   Physical Exam  BP (!) 172/63    Pulse 79    Temp 97.9 F (36.6 C)    Resp 18    LMP 12/16/2015    SpO2 98%  Gen:   Awake, no distress   Resp:  Normal effort  MSK:   Moves extremities without difficulty, is able to move stump on right side and lift left leg. Compression garment in place left leg.  Other:  Normal speech  Medical Decision Making  Medically screening exam initiated at 1:03 PM.  Appropriate orders placed.  Natalie Contreras was informed that the remainder of the evaluation will be completed by another provider, this initial triage assessment does not replace that evaluation, and the importance of remaining in the ED until their evaluation is complete.  Note: Portions of this report may have been transcribed using voice recognition software. Every effort was made to ensure accuracy; however, inadvertent computerized transcription errors may be present    Lorin Glass, PA-C 10/23/21 1305

## 2021-10-23 NOTE — Progress Notes (Incomplete)
{  Select Note:[id number]} 

## 2021-10-23 NOTE — ED Provider Notes (Signed)
[facility] DEPT Provider Note   CSN: [id number] Arrival date & time: 10/23/21  1234     History  Chief Complaint  Patient presents with   Leg Swelling    LADORIS LYTHGOE is a 56 y.o. female.  HPI Patient has a complicated medical history.  She was recently admitted to the hospital the end of January for symptomatic anemia and was ultimately diagnosed with metastatic cancer.  Record records reviewed.  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  Patient was discharged on February 2.  Does look like her torsemide was discontinued. Patient presented to the ED for evaluation of leg swelling.  Patient was supposed to go to the oncology appointment today but when she woke up her legs were very swollen.  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  Patient denies any chest pain or shortness of breath.  She has not had any fevers or chills.  Patient feels that her legs are very heavy and hard to move.  Home Medications Prior to Admission medications   Medication Sig Start Date End Date Taking? Authorizing Provider  acetaminophen (TYLENOL) 325 MG tablet Take 1-2 tablets (325-650 mg total) by mouth every 4 (four) hours as needed for mild pain. 09/13/19   Love, Ivan Anchors, PA-C  carvedilol (COREG) 3.125 MG tablet TAKE (1) TABLET BY MOUTH TWICE DAILY WITH A MEAL. Patient taking differently: Take 3.125 mg by mouth 2 (two) times daily with a meal. 10/20/19   Jamse Arn, MD  Continuous Blood Gluc Sensor (DEXCOM G6 SENSOR) MISC SMARTSIG:1 Each Topical Every 10 Days 10/03/21   [provider]  diazepam (VALIUM) 5 MG tablet Take 1 tablet (5 mg total) by mouth every 8 (eight) hours as needed for up to 1 dose for anxiety. 10/18/21   Nolberto Hanlon, MD  docusate sodium (COLACE) 100 MG capsule  Take 1 capsule (100 mg total) by mouth 2 (two) times daily. Patient not taking: Reported on 10/13/2021 09/16/19   Love, Ivan Anchors, PA-C  ergocalciferol (VITAMIN D2) 1.25 MG (50000 UT) capsule Take 50,000 Units by mouth once a week. Sunday    [provider]  insulin aspart (NOVOLOG) 100 UNIT/ML injection Up to 100 units per day in omni pod. Omnipod to be filled with Novolog to be accurately injected by Omnipod based on settings.    [provider]  JARDIANCE 25 MG TABS tablet Take 25 mg by mouth daily. 10/03/21   [provider]  Multiple Vitamin (MULTIVITAMIN WITH MINERALS) TABS tablet Take 1 tablet by mouth daily. Patient not taking: Reported on 10/13/2021 09/14/19   Love, Ivan Anchors, PA-C  pantoprazole (PROTONIX) 40 MG tablet Take 1 tablet (40 mg total) by mouth 2 (two) times daily. 10/18/21 11/17/21  Nolberto Hanlon, MD  polyethylene glycol (MIRALAX / GLYCOLAX) 17 g packet Take 17 g by mouth 2 (two) times daily. 09/16/19   Love, Ivan Anchors, PA-C  simvastatin (ZOCOR) 40 MG tablet Take 40 mg by mouth at bedtime.     [provider]  sucralfate (CARAFATE) 1 GM/10ML suspension Take 10 mLs (1 g total) by mouth 4 (four) times daily -  with meals and at bedtime. 10/18/21 11/17/21  Nolberto Hanlon, MD      Allergies    Patient has no known allergies.  Review of Systems   Review of Systems  All other systems reviewed and are negative.  Physical Exam Updated Vital Signs BP (!) 153/46    Pulse 70    Temp 97.9 F (36.6 C)    Resp 16    LMP 12/16/2015    SpO2 99%  Physical Exam Vitals and nursing note reviewed.  Constitutional:      General: She is not in acute distress.    Appearance: She is well-developed.  HENT:     Head: Normocephalic and atraumatic.     Right Ear: External ear normal.     Left Ear: External ear normal.  Eyes:     General: No scleral icterus.       Right eye: No discharge.        Left eye: No discharge.     Conjunctiva/sclera: Conjunctivae normal.   Neck:     Trachea: No tracheal deviation.  Cardiovascular:     Rate and Rhythm: Normal rate and regular rhythm.  Pulmonary:     Effort: Pulmonary effort is normal. No respiratory distress.     Breath sounds: Normal breath sounds. No stridor. No wheezing or rales.  Abdominal:     General: Bowel sounds are normal. There is no distension.     Palpations: Abdomen is soft.     Tenderness: There is no abdominal tenderness. There is no guarding or rebound.  Musculoskeletal:        General: No tenderness or deformity.     Cervical back: Neck supple.     Right lower leg: Edema present.     Left lower leg: Edema present.     Comments: Pitting edema noted left lower extremities  Skin:    General: Skin is warm and dry.     Coloration: Skin is pale.     Findings: No rash.  Neurological:     General: No focal deficit present.     Mental Status: She is alert.     Cranial Nerves: No cranial nerve deficit (no facial droop, extraocular movements intact, no slurred speech).     Sensory: No sensory deficit.     Motor: No abnormal muscle tone or seizure activity.     Coordination: Coordination normal.  Psychiatric:        Mood and Affect: Mood normal.    ED Results / Procedures / Treatments   Labs (all labs ordered are listed, but only abnormal results are displayed) Labs Reviewed  COMPREHENSIVE METABOLIC PANEL - Abnormal; Notable for the following components:      Result Value   Potassium 3.1 (*)    Glucose, Bld 158 (*)    BUN 34 (*)    Creatinine, Ser 1.19 (*)    Albumin 3.1 (*)    AST 13 (*)    Alkaline Phosphatase 140 (*)    GFR, Estimated 54 (*)    All other components within normal limits  CBC WITH DIFFERENTIAL/PLATELET - Abnormal; Notable for the following components:   RBC 3.53 (*)    Hemoglobin 8.6 (*)    HCT 30.1 (*)    MCH 24.4 (*)    MCHC 28.6 (*)    RDW 20.4 (*)    All other components within normal limits  BRAIN NATRIURETIC PEPTIDE - Abnormal; Notable for the  following components:   B Natriuretic Peptide 353.4 (*)    All other components within normal limits    EKG None  Radiology VAS [place] LOWER EXTREMITY VENOUS (DVT) (7a-7p)  Result  Date: 10/23/2021  Lower Venous DVT Study Patient Name:  MALAYA CAGLEY  Date of Exam:   10/23/2021 Medical Rec #: [id number]        Accession #:    [id number] Date of Birth: 24-Mar-1966       Patient Gender: F Patient Age:   45 years Exam Location:  [facility] Procedure:      VAS [place] LOWER EXTREMITY VENOUS (DVT) Referring Phys: Wille Glaser Annlee Glandon --------------------------------------------------------------------------------  Indications: Swelling of left lower extremity.  Limitations: Poor ultrasound/tissue interface. Comparison Study: 07-17-2017 Prior bilateral lower extremity venous was negative                   for DVT. Performing Technologist: Darlin Coco RDMS, RVT  Examination Guidelines: A complete evaluation includes B-mode imaging, spectral Doppler, color Doppler, and power Doppler as needed of all accessible portions of each vessel. Bilateral testing is considered an integral part of a complete examination. Limited examinations for reoccurring indications may be performed as noted. The reflux portion of the exam is performed with the patient in reverse Trendelenburg.  +-----+---------------+---------+-----------+----------+--------------+  RIGHT Compressibility Phasicity Spontaneity Properties Thrombus Aging  +-----+---------------+---------+-----------+----------+--------------+  CFV   Full            Yes       Yes                                    +-----+---------------+---------+-----------+----------+--------------+   +---------+---------------+---------+-----------+----------+-------------------+  LEFT      Compressibility Phasicity Spontaneity Properties Thrombus Aging       +---------+---------------+---------+-----------+----------+-------------------+  CFV       Full            Yes       Yes                                          +---------+---------------+---------+-----------+----------+-------------------+  SFJ       Full                                                                  +---------+---------------+---------+-----------+----------+-------------------+  FV Prox   Full                                                                  +---------+---------------+---------+-----------+----------+-------------------+  FV Mid    Full                                                                  +---------+---------------+---------+-----------+----------+-------------------+  FV Distal Full                                                                  +---------+---------------+---------+-----------+----------+-------------------+  PFV       Full                                                                  +---------+---------------+---------+-----------+----------+-------------------+  POP       Full            Yes       Yes                                         +---------+---------------+---------+-----------+----------+-------------------+  PTV                                                        Not well visualized  +---------+---------------+---------+-----------+----------+-------------------+  PERO                                                       Not well visualized  +---------+---------------+---------+-----------+----------+-------------------+    Summary: RIGHT: - No evidence of common femoral vein obstruction.  LEFT: - There is no evidence of deep vein thrombosis in the lower extremity. However, portions of this examination were limited- see technologist comments above.  - No cystic structure found in the popliteal fossa.  *See table(s) above for measurements and observations.    Preliminary     Procedures Procedures    Medications Ordered in ED Medications  dexamethasone (DECADRON) injection 10 mg (has no administration in time range)  furosemide (LASIX) injection 40 mg (40 mg  Intravenous Given 10/23/21 1818)    ED Course/ Medical Decision Making/ A&P Clinical Course as of 10/23/21 2319  Tue Oct 23, 2021  1759 CBC with Differential(!) CBC shows stable anemia [JK]  1800 Comprehensive metabolic panel(!) Metabolic panel shows improving renal function [JK]  1800 Brain natriuretic peptide(!) BNP elevated compared to previous [JK]  2020 Doppler study negative for DVT [JK]  2216 Patient attempted to ambulate.  She is able to move her leg but is having difficulty transferring and moving. [JK]  2216 Discussed with Dr Alcario Drought.  Will add on head ct as requested.  Recent MRI results discussed  [JK]    Clinical Course User Index [JK] Dorie Rank, MD                           Medical Decision Making Amount and/or Complexity of Data Reviewed Labs:  Decision-making details documented in ED Course. Radiology: ordered. ECG/medicine tests: ordered.  Risk Prescription drug management. Decision regarding hospitalization.   Patient presents to the ED with complaints of weakness and new onset leg swelling.  Patient evaluated for DVT.  Fortunately no signs of DVT on ultrasound.  Patient does have worsening peripheral edema.  She was taken off her diuretic when she left the hospital.  I suspect this is contributing to the leg swelling.  This may be causing  some of the difficulty she is having moving her leg but it appears she is having more trouble than I would suspect just from her leg edema.  Patient does have known metastatic cancer with multiple hemorrhagic lesions on recent brain imaging.  Patient had an MRI on February 4.  It showed multiple hemorrhagic lesions with some evidence of perilesional edema.  This may be causing some of her gait issues and leg weakness.  Patient was post to follow-up and have radiation oncology treatment tomorrow.  She is having difficulty ambulating and cannot safely go home.  I will consult the medical service for admission.         Final  Clinical Impression(s) / ED Diagnoses Final diagnoses:  Peripheral edema  Metastatic malignant neoplasm, unspecified site [facility])  Weakness     Dorie Rank, MD 10/23/21 2319

## 2021-10-23 NOTE — ED Notes (Signed)
Called [employer] per request of Knapp MD for hospitalist

## 2021-10-23 NOTE — Telephone Encounter (Signed)
Called pt. to check in since she has not arrived for her 9:30 nurse eval. She said that she is not doing well at home. She tried for about an hour to get into the car this morning but is unable to move her lower extremities (which is new since discharge). She could not stand, pivot, or lift her leg. She is very upset and scared about this but claims that she is not able to get into the car to go anywhere to have it checked out.  Pt has been advised to call 911 to have an ambulance take her to the hospital for evaluation. I told her if they will bring her to [facility] for evaluation that would be the preference since the cancer center is here, but understand that since she lives in [place], they may have to transport her to [facility] instead.   Mont Dutton R.T.(R)(T) Radiation Special Procedures Navigator [phone]

## 2021-10-23 NOTE — ED Triage Notes (Signed)
Per EMS-patient states she called cancer center for appointment today-called EMS to take her-when they got to cancer center they informed EMS that she needed to come to ED due to "not being well"

## 2021-10-23 NOTE — ED Triage Notes (Signed)
Per pt, states she was suppose to have a "treatment" today but the cancer center told her that she would have to have the swelling out of her leg before she could have her treatment

## 2021-10-23 NOTE — Progress Notes (Signed)
Lower extremity venous LT study completed.  Preliminary results relayed to Tomi Bamberger, MD via secure chat.  See CV Proc for preliminary results report.   Darlin Coco, RDMS, RVT

## 2021-10-23 NOTE — Progress Notes (Signed)
Lower extremity venous attempted. Patient receiving nursing care. Will attempt again.   Darlin Coco, RDMS, RVT

## 2021-10-23 NOTE — Progress Notes (Signed)
Radiation Oncology         [phone] ________________________________  Follow-up New Visit   Inpatient  Name: CADANCE RAUS MRN: [id number]  Date: 10/24/2021  DOB: 07/12/1966  VO:ZDGU, Edwinna Areola, MD  Celene Squibb, MD   REFERRING PHYSICIAN: Celene Squibb, MD  DIAGNOSIS:    ICD-10-CM   1. 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 To review from her initial consultation on 10/17/21, we discussed the need for 3T MRI imaging to determine if she is a candidate for targeted stereotactic radiosurgery or whole brain radiation. (Pathology results were also pending at the time of her initial consultation).   Stomach biopsies performed on 10/16/21 revealed metastatic carcinoma consistent with suspected RCC primary from one biopsy sample, and gastric antral mucosa with marked nonspecific reactive gastropathy from the second sample.  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  3T MRI performed on 10/19/21 demonstrated multiple (greater than 10) hemorrhagic lesions present, in addition to evolution of the intralesional hemorrhage in the interval. A T1 hyperintensity of the perilesional edema was also appreciated, which is commonly associated with cavernous malformations, but these are presumed to reflect metastases in the setting of renal malignancy. A few of the lesions were not visible on the prior study or are considered new. Many of these were noted to demonstrate corresponding  diffusion hyperintensities. Therefore, subacute infarcts were noted as a consideration for this subset.   HISTORY OF PRESENT ILLNESS::Adriena C Vanhecke is a 56 y.o. female who presented to the ED on 10/13/21 with c/o generalized weakness x 2 months, melena, and lymphadenopathy on head, neck, chest and back. She was accordingly admitted with anemia and received 2 units PRBCs.   CT of the chest abdomen and pelvis performed on 10/13/21 demonstrated a 10 cm renal mass with invasion of the left posterior abdominal wall, concerning for primary renal cell carcinoma. Adjacent metastatic soft tissue nodules were also noted within the left perinephric space, as well as bilateral pulmonary metastases, two small masses along the capsular surface of the right kidney (noted to possibly represent synchronous primary renal cell carcinomas or metastatic disease), metastatic disease involving Gerota's fascia on the right, mild abdominal lymphadenopathy, diffuse liver metastases, small suspicious right adrenal mass, and diffuse chest and abdominal wall soft tissue metastases.  CT of the head on 10/13/21 also showed a 2.7 cm peripherally hyperdense masslike area in the left parieto-occipital lobe, with adjacent vasogenic edema. Findings were noted as concerning for hemorrhagic metastasis. Findings also indicated the likelihood of additional lesions in the bilateral cerebral hemispheres.  Biopsy appts pending, pathology is pending at this time.   MRI of the brain on 10/15/21 further revealed multiple enhancing lesions in the bilateral cerebral hemispheres, with additional extra-axial lesion along the right parietal lobe, and possible lesions in the atrium of the left lateral ventricle. Several of these lesions were noted to demonstrate associated edema and intralesional hemorrhage, including the dominant left parieto-occipital lesion. Findings were again noted as overall concerning for metastatic disease. (Additional lesions  were again  noted as likely present, but obscured by the motion artifact).  She presented to the ED last night and is still waiting for a bed upstairs - has been admitted. She missed simulation last night due to leg heaviness, ambulation issues, prompting trip to ED.  Left LE is much more swollen than usual.  CT head in ED showed worsening lesions and increased hemorrhage. Decadron was started.  CT simulation rescheduled for today.  I have personally reviewed her brain and systemic imaging.   PREVIOUS RADIATION THERAPY: No  PAST MEDICAL HISTORY:  has a past medical history of Abscess of foot (51/88/4166), Acute diastolic CHF (congestive heart failure) ([place]) (10/28/2016), Carotid stenosis, asymptomatic, bilateral (01/15/2017), Diabetes mellitus without complication ([place]), Heart murmur, Hypertension, and PAD (peripheral artery disease) ([place]) (04/10/2017).    PAST SURGICAL HISTORY: Past Surgical History:  Procedure Laterality Date   ABDOMINAL AORTOGRAM W/LOWER EXTREMITY N/A 08/10/2019   Procedure: ABDOMINAL AORTOGRAM W/LOWER EXTREMITY;  Surgeon: Waynetta Sandy, MD;  Location: [facility];  Service: Cardiovascular;  Laterality: N/A;   AMPUTATION Right 09/03/2019   Procedure: RIGHT BELOW KNEE AMPUTATION;  Surgeon: Newt Minion, MD;  Location: [place];  Service: Orthopedics;  Laterality: Right;   AMPUTATION TOE Left 05/05/2017   Procedure: LEFT SECOND TOE AMPUTATION;  Surgeon: Aviva Signs, MD;  Location: [facility];  Service: General;  Laterality: Left;   BIOPSY  10/16/2021   Procedure: BIOPSY;  Surgeon: Sharyn Creamer, MD;  Location: [facility];  Service: Gastroenterology;;   CATARACT EXTRACTION W/PHACO Left 02/08/2016   Procedure: CATARACT EXTRACTION PHACO AND INTRAOCULAR LENS PLACEMENT ([place]);  Surgeon: Tonny [facility], MD;  Location: [facility];  Service: Ophthalmology;  Laterality: Left;  CDE 11.43    CATARACT EXTRACTION W/PHACO Right 02/26/2016   Procedure: CATARACT EXTRACTION PHACO AND  INTRAOCULAR LENS PLACEMENT RIGHT; CDE:  16.90;  Surgeon: Tonny [facility], MD;  Location: [facility];  Service: Ophthalmology;  Laterality: Right;   CHOLECYSTECTOMY     ESOPHAGOGASTRODUODENOSCOPY (EGD) WITH PROPOFOL N/A 10/16/2021   Procedure: ESOPHAGOGASTRODUODENOSCOPY (EGD) WITH PROPOFOL;  Surgeon: Sharyn Creamer, MD;  Location: [place];  Service: Gastroenterology;  Laterality: N/A;   FOREIGN BODY REMOVAL Left 04/09/2017   Procedure: FOREIGN BODY REMOVAL ADULT FOOT;  Surgeon: Aviva Signs, MD;  Location: [facility];  Service: General;  Laterality: Left;   I & D EXTREMITY Right 08/11/2019   Procedure: IRRIGATION AND DEBRIDEMENT EXTREMITY;  Surgeon: Newt Minion, MD;  Location: [facility];  Service: Orthopedics;  Laterality: Right;   PERIPHERAL VASCULAR ATHERECTOMY Right 08/10/2019   Procedure: PERIPHERAL VASCULAR ATHERECTOMY;  Surgeon: Waynetta [facility], MD;  Location: [facility];  Service: Cardiovascular;  Laterality: Right;  superficial femoral   PERIPHERAL VASCULAR BALLOON ANGIOPLASTY Right 08/10/2019   Procedure: PERIPHERAL VASCULAR BALLOON ANGIOPLASTY;  Surgeon: Waynetta [facility], MD;  Location: [facility] [facility];  Service: Cardiovascular;  Laterality: Right;  anterior tibial   TRANSMETATARSAL AMPUTATION Left 07/21/2017   Procedure: TRANSMETATARSAL AMPUTATION LEFT FOOT;  Surgeon: Aviva Signs, MD;  Location: [facility];  Service: General;  Laterality: Left;    FAMILY HISTORY: family history includes AAA (abdominal aortic aneurysm) in her brother; Lung cancer in her father and mother.  SOCIAL HISTORY:  reports that she has been smoking cigarettes. She started smoking about 37 years ago. She has a 12.50 pack-year smoking history. She has never used smokeless tobacco. She reports that she does not drink alcohol and does not use drugs.  ALLERGIES: Patient has no known allergies.  MEDICATIONS:  No current facility-administered medications for this encounter.   No current outpatient  medications on file.   Facility-Administered Medications Ordered in Other Encounters  Medication Dose Route Frequency Provider Last Rate Last Admin   acetaminophen (TYLENOL) tablet 650 mg  650 mg Oral Q6H PRN Etta Quill, DO       Or   acetaminophen (TYLENOL) suppository 650 mg  650 mg Rectal Q6H PRN Etta Quill, DO       carvedilol (COREG) tablet 3.125 mg  3.125 mg Oral BID WC Jennette Kettle M, DO   3.125 mg at 10/24/21 1740   dexamethasone (DECADRON) injection 4 mg  4 mg Intravenous Q6H Jennette Kettle M, DO   4 mg at 10/24/21 1743   docusate sodium (COLACE) capsule 100 mg  100 mg Oral BID PRN Etta Quill, DO       furosemide (LASIX) injection 40 mg  40 mg Intravenous Daily Jennette Kettle M, DO   40 mg at 10/24/21 6759   hydrALAZINE (APRESOLINE) injection 10-20 mg  10-20 mg Intravenous Q4H PRN Etta Quill, DO   20 mg at 10/24/21 1741   insulin aspart (novoLOG) injection 0-15 Units  0-15 Units Subcutaneous TID WC Etta Quill, DO   3 Units at 10/24/21 1214   insulin detemir (LEVEMIR) injection 10 Units  10 Units Subcutaneous QHS Pokhrel, Laxman, MD       multivitamin with minerals tablet 1 tablet  1 tablet Oral Daily Jennette Kettle M, DO   1 tablet at 10/24/21 0922   ondansetron (ZOFRAN) tablet 4 mg  4 mg Oral Q6H PRN Etta Quill, DO       Or   ondansetron [facility]) injection 4 mg  4 mg Intravenous Q6H PRN Etta Quill, DO       pantoprazole (PROTONIX) EC tablet 40 mg  40 mg Oral BID Jennette Kettle M, DO   40 mg at 10/24/21 1638   polyethylene glycol (MIRALAX / GLYCOLAX) packet 17 g  17 g Oral BID PRN Etta Quill, DO       potassium chloride SA (KLOR-CON M) CR tablet 40 mEq  40 mEq Oral Daily Jennette Kettle M, DO   40 mEq at 10/24/21 0920   simvastatin (ZOCOR) tablet 40 mg  40 mg Oral QHS Jennette Kettle M, DO       sucralfate (CARAFATE) 1 GM/10ML suspension 1 g  1 g Oral TID WC & HS Etta Quill, DO   1 g at 10/24/21 1740    REVIEW OF SYSTEMS:   Notable for that above.   PHYSICAL EXAM:  vitals were not taken for this visit.   General: Alert and oriented, in no acute distress   HEENT: no oral thrush. EOMI Ext: LLE pitting edema. Below the knee amputation, RLE.   Skin: pale MSK: able to move extremities against mild resistance. Neuro: denies numbness of extremities.  Decreased vision in RLQ field of view  LABORATORY DATA:  Lab Results  Component Value Date   WBC 5.7 10/24/2021   HGB 7.9 (L) 10/24/2021   HCT 27.2 (L) 10/24/2021   MCV 82.7 10/24/2021   PLT 387 10/24/2021   CMP     Component Value Date/Time   NA 136 10/24/2021 0431   K 3.4 (L) 10/24/2021 0431   CL 106 10/24/2021 0431   CO2 21 (L) 10/24/2021 0431   GLUCOSE 219 (H) 10/24/2021 0431   BUN 31 (H) 10/24/2021 0431   CREATININE 1.34 (  H) 10/24/2021 0431   CREATININE 0.83 03/04/2017 1637   CALCIUM 8.6 (L) 10/24/2021 0431   PROT 7.1 10/23/2021 1320   ALBUMIN 3.1 (L) 10/23/2021 1320   AST 13 (L) 10/23/2021 1320   ALT 14 10/23/2021 1320   ALKPHOS 140 (H) 10/23/2021 1320   BILITOT 0.5 10/23/2021 1320   GFRNONAA 47 (L) 10/24/2021 0431   GFRAA >60 09/13/2019 0835         RADIOGRAPHY: DG Chest 2 View  Result Date: 10/13/2021 CLINICAL DATA:  smoker. weakness. rule out infection/malignancy EXAM: CHEST - 2 VIEW COMPARISON:  April 07, 2017 FINDINGS: The cardiomediastinal silhouette is enlarged in contour.Atherosclerotic calcifications. No pleural effusion. No pneumothorax. Multiple nodular opacities throughout bilateral lungs. Surgical clips project over the upper abdomen. Suggestion of sclerosis of several vertebral bodies. IMPRESSION: 1. Multiple nodular opacities bilaterally. Suggestion of multifocal sclerosis of the vertebral bodies versus summation artifact. Findings are concerning for multifocal metastases. Recommend further dedicated evaluation with cross-sectional imaging. Electronically Signed   By: Valentino Saxon M.D.   On: 10/13/2021 15:29   CT Head Wo  Contrast  Result Date: 10/24/2021 CLINICAL DATA:  Brain/CNS neoplasm to assess treatment response. EXAM: CT HEAD WITHOUT CONTRAST TECHNIQUE: Contiguous axial images were obtained from the base of the skull through the vertex without intravenous contrast. RADIATION DOSE REDUCTION: This exam was performed according to the departmental dose-optimization program which includes automated exposure control, adjustment of the mA and/or kV according to patient size and/or use of iterative reconstruction technique. COMPARISON:  MRI brain 10/19/2021.  CT head 10/13/2021 FINDINGS: Brain: Multiple intracranial metastases are again demonstrated. Largest is a heterogeneous mixed density but mostly hyperdense lesion in the left parietal lobe measuring 3 x 3.7 cm diameter. Size measures larger than on previous CT. There is surrounding vasogenic edema as before. Multiple additional mostly hyperdense lesions are also demonstrated. Lesions are more numerous and larger than on the previous CT but were demonstrated on the prior MRI. Hyperdensity in the lesions suggest hemorrhagic component. No mass effect or midline shift. No abnormal extra-axial fluid collections. Ventricles are not dilated. Vascular: Intracranial arterial vascular calcifications are present. Skull: Normal. Negative for fracture or focal lesion. Sinuses/Orbits: Paranasal sinuses and mastoid air cells are clear. Other: None. IMPRESSION: Multiple intracranial metastases, some hemorrhagic, and associated vasogenic white matter edema demonstrating progression since prior CT, possibly due to intervening hemorrhage within the lesions. Size and distribution of the lesions is similar to the interval MRI. Greater than 10 lesions are present. No acute mass effect. Electronically Signed   By: Lucienne Capers M.D.   On: 10/24/2021 00:32   CT Head Wo Contrast  Result Date: 10/13/2021 CLINICAL DATA:  dizziness. blurred vision. EXAM: CT HEAD WITHOUT CONTRAST TECHNIQUE:  Contiguous axial images were obtained from the base of the skull through the vertex without intravenous contrast. RADIATION DOSE REDUCTION: This exam was performed according to the departmental dose-optimization program which includes automated exposure control, adjustment of the mA and/or kV according to patient size and/or use of iterative reconstruction technique. COMPARISON:  February 17, 2017. FINDINGS: Brain: There is a lesion in the LEFT parieto-occipital lobe with adjacent vasogenic edema and a peripheral rim of intrinsic hyperdensity. It measures 2.6 x 2.4 by 2.7 cm. There is an additional intrinsically hyperdense region along the RIGHT frontoparietal falx which measures 7 by 5 mm which is concerning for an additional site of disease (series 2, image 23). Hyperdensity of the LEFT frontal lobe measuring approximately 5 mm likely reflects an additional site  of disease (series 2, image 25). Hypodensity of the RIGHT occipital lobe with a small amount of intrinsic hyperdensity likely reflecting additional lesion (series 2, image 12; series 5, image 28). Hypodensity of the LEFT inferior cerebellum, consistent with the sequela of remote prior infarction and similar comparison to prior. No significant midline shift. Vascular: Vascular calcifications of the carotid siphons. Skull: No acute fracture. Sinuses/Orbits: LEFT mastoid effusion. Other: None. IMPRESSION: 1. There is a 2.7 cm peripherally hyperdense masslike area in the LEFT parieto-occipital lobe with adjacent vasogenic edema. 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 Recommend further evaluation with dedicated brain MRI with and without contrast. 2. There are likely additional lesions in the bilateral cerebral hemispheres. These results were called by telephone at the time of interpretation on 10/13/2021 at 3:20 pm to provider MARGAUX VENTER , who verbally acknowledged these results. Electronically Signed   By: Valentino Saxon M.D.   On: 10/13/2021 15:28    MR Brain W Wo Contrast  Result Date: 10/20/2021 CLINICAL DATA:  Brain metastases, unknown primary 3T SRS Protocol for treatment planning EXAM: MRI HEAD WITHOUT AND WITH CONTRAST TECHNIQUE: Multiplanar, multiecho pulse sequences of the brain and surrounding structures were obtained without and with intravenous contrast. CONTRAST:  42mL GADAVIST GADOBUTROL 1 MMOL/ML IV SOLN COMPARISON:  10/15/2021 FINDINGS: Brain: Multiple parenchymal masses are again identified. Possible dural lesion along the falx is also again noted. In the interval, there has been evolution of hemorrhage associated with the lesions that now demonstrate significant intrinsic T1 shortening including within the surrounding area of edema. Therefore, evaluation for enhancement is now limited. Greater than 10 lesions are present. A few were not visible on the prior study or are new. For example, along the body of the caudate on the right (series 1100, image 178) and several punctate lesions in the right cerebral hemisphere (images 178, 181, 192, 197, and 202). Many of these lesions demonstrate corresponding diffusion hyperintensity. There also additional foci of diffusion hyperintensity in the frontoparietal white matter bilaterally without definite corresponding enhancement. This was also present on the prior study. The right caudate body lesion was visible on SWI on the prior study. There is trace subdural hemorrhage along the cerebral convexities posteriorly. Likely minimal sulcal subarachnoid hemorrhage associated with the dominant left parieto-occipital lesion. No significant change in mass effect. Vascular: Major vessel flow voids at the skull base are preserved. Skull and upper cervical spine: Normal marrow signal is preserved. Sinuses/Orbits: Paranasal sinuses are aerated. Orbits are unremarkable. Other: [place] is unremarkable.  Mastoid air cells are clear. IMPRESSION: Multiple (greater than 10) hemorrhagic lesions are present with evolution  of intralesional hemorrhage in the interval. There is T1 hyperintensity of the perilesional edema, a finding more commonly associated with cavernous malformations, but these are presumed to reflect metastases in the setting of renal malignancy. A few of the lesions were not visible on the prior study or are new. Many of these demonstrate corresponding diffusion hyperintensity. Therefore, subacute infarcts are consideration for this subset. Electronically Signed   By: Macy Mis M.D.   On: 10/20/2021 14:05   MR BRAIN W WO CONTRAST  Result Date: 10/16/2021 CLINICAL DATA:  CNS neoplasm, BrainLAB protocol EXAM: MRI HEAD WITHOUT AND WITH CONTRAST TECHNIQUE: Multiplanar, multiecho pulse sequences of the brain and surrounding structures were obtained without and with intravenous contrast. CONTRAST:  71mL GADAVIST GADOBUTROL 1 MMOL/ML IV SOLN COMPARISON:  No prior MRI, correlation is made with 10/13/2021 CT head FINDINGS: Evaluation is limited by motion artifact. Brain: Multiple enhancing  masses in the bilateral cerebral hemispheres, the largest of which is in the left parietooccipital lobe and measures up to 3.3 x 2.9 x 3.3 cm (AP x TR x CC) (series 9, image 92). Additional smaller lesions include a left parietal lesion that measures 0.7 x 0.5 x 0.7 cm (series 9, image 39 and series 10, image 29), a left frontal lobe lesion that measures 0.6 x 0.4 x 0.6 cm (series 9, image 139 and series 10, image 43), a right frontal lobe lesion that measures 0.3 x 0.4 x 0.4 cm (series 9, image 50 and series 10, image 52), a right extra-axial lesion along the falx adjacent to the right parietal lobe that measures 1.0 x 0.5 x 1.0 cm (series 9, image 60 and series 10, image 23), right parietal lesion that measures 1.0 x 0.9 x 1.0 cm (series 9, image 80 and series 10, image 20), a left parietal lesion that measures 0.4 x 0.8 x 0.5 cm (series 9, image 82 and series 10, image 22), a left occipital lesion that measures 0.5 x 0.8 x 0.6 cm  (series 9, image 124 and series 10, image 8), and a right occipital lesion (or possibly 2 adjacent lesions) that measures 1.2 x 1.7 x 0.9 cm (series 9, image 128 and series 10, image 8). Possible additional lesions in the right frontal lobe adjacent to the right lateral ventricle, which is faintly visualized on the coronal postcontrast sequences (series 10, image 32), measuring 0.6 x 0.5 cm, and in the atrium of the right lateral ventricle (series 11, image 26), measuring approximately 0.4 cm, best seen on the sagittal sequence. These lesions are associated with surrounding T2 hyperintense signal, likely edema, with the largest area of edema surrounding the dominant left parieto-occipital lesion. Several of these lesions are also associated with hemosiderin deposition (series 6, image 54, 76, 81, and 82). Two rounded areas of hemosiderin deposition in the left lateral ventricle atrium (series 6, image 76 and 69) are difficult to find definite enhancing lesions to correlate with on the axial sequences, given enhancement in the choroid plexus, but possible correlates are suspected on the sagittal postcontrast sequence (series 11, image 24 and 26). No other areas of parenchymal hemorrhage. No definite midline shift. 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 No hydrocephalus or extra-axial collection. No diffusion-weighted sequence was available to evaluate for infarct given the selected protocol. Vascular: The left PCA flow void is not well visualized, however the vessel is seen on postcontrast imaging. Otherwise normal flow voids. Skull and upper cervical spine: Normal marrow signal. Sinuses/Orbits: Status post bilateral lens replacements. Otherwise negative. Other: Trace fluid in the mastoid air cells. IMPRESSION: Evaluation is limited by motion artifact. Within this limitation, multiple enhancing lesions in the bilateral cerebral hemispheres, with additional extra-axial lesion along  the right parietal lobe and possible lesions in the atrium of the left lateral ventricle. Several of these lesions demonstrate associated edema and intralesional hemorrhage, including the dominant left parieto-occipital lesion. These are overall concerning for metastatic disease. Additional lesions may be present, but obscured by the motion artifact. Electronically Signed   By: Merilyn Baba M.D.   On: 10/16/2021 02:05   CT CHEST ABDOMEN PELVIS W CONTRAST  Result Date: 10/13/2021 CLINICAL DATA:  Brain metastasis of unknown primary. EXAM: CT CHEST, ABDOMEN, AND PELVIS WITH CONTRAST TECHNIQUE: Multidetector CT imaging of the chest, abdomen and pelvis was performed following the standard protocol during bolus administration of intravenous contrast. RADIATION DOSE REDUCTION: This exam  was performed according to the departmental dose-optimization program which includes automated exposure control, adjustment of the mA and/or kV according to patient size and/or use of iterative reconstruction technique. CONTRAST:  62mL OMNIPAQUE IOHEXOL 300 MG/ML  SOLN COMPARISON:  None. FINDINGS: CT CHEST FINDINGS Cardiovascular: Mild cardiomegaly. Aortic and coronary atherosclerotic calcification noted. Mediastinum/Lymph Nodes: Mild bilateral hilar lymphadenopathy is seen. No pathologically enlarged mediastinal or axillary lymph nodes are identified. Lungs/Pleura: Multiple solid bilateral pulmonary nodules are seen, consistent with diffuse pulmonary metastases. Index nodule in the medial right lower lobe measures 2.9 x 1.7 cm on image 92/3. Index nodule in the posterolateral left lower lobe measures 4.4 x 2.2 cm on image 109/3. Small left pleural effusion also seen. Musculoskeletal: Multiple chest wall soft tissue masses are seen bilaterally, involving the breasts, chest wall muscles and subcutaneous tissues. The 2 largest masses involve the right pectoralis major muscle, measuring 3.3 x 2.9 cm on image 12/2, and the right  supraspinatus muscle measuring 5.7 x 3.2 cm on image 21/2. CT ABDOMEN AND PELVIS FINDINGS Hepatobiliary: Multiple hypovascular masses are seen throughout the right and left hepatic lobes, largest in the lateral dome of the right hepatic lobe measuring 10.7 x 5.1 cm. These are consistent diffuse liver metastases. Prior cholecystectomy. No evidence of biliary obstruction. Pancreas:  No mass or inflammatory changes. Spleen:  Within normal limits in size and appearance. Adrenals/Urinary tract: A small right adrenal mass is seen measuring 2.0 x 1.5 cm, which is indeterminate but suspicious for adrenal metastasis. A large heterogeneously enhancing mass is seen involving the posterior left kidney, which shows invasion of the posterior abdominal wall soft tissues. This measures 10.3 x 8.9 cm, and is consistent with primary renal cell carcinoma. Smaller enhancing soft tissue nodules are also seen in the left posterior pararenal space, consistent with metastatic disease. Two small masses are seen along the capsular surface of the anterior mid and lower poles of the right kidney, which measure 2.2 cm and 1.5 cm in diameter. These could represent synchronous primary renal cell carcinomas of the right kidney, or metastatic disease. Two soft tissue nodules are involve the lateral portion of Gerota's fascia on the right, largest measuring 2.2 x 1.6 cm. A subcapsular cyst measuring 4 cm is also seen in the lateral interpolar region of the right kidney. Stomach/Bowel: No evidence of obstruction, inflammatory process, or abnormal fluid collections. Vascular/Lymphatic: Mild lymphadenopathy is seen in the left paraaortic region, largest measuring 10 mm on image 67/2. A 1.3 cm lymph node is seen in the deep left abdominal small bowel mesentery adjacent to the duodenum. No pelvic lymphadenopathy identified. Aortic atherosclerotic calcification noted. Left retroaortic renal vein is noted, however there is no evidence of renal vein or IVC  thrombus. Reproductive: No masses identified. Small amount of gas noted within the endometrial cavity uterus. Other:  None. Musculoskeletal: No suspicious bone lesions identified. Multiple small masses are seen scattered throughout the abdominal wall soft tissues, consistent with metastatic disease. IMPRESSION: 10 cm left renal mass with invasion of the left posterior abdominal wall soft tissues, consistent with primary renal cell carcinoma. Adjacent metastatic soft tissue nodules also noted within the left perinephric space. Diffuse bilateral pulmonary metastases. Two small masses along the capsular surface of the right kidney, which may represent synchronous primary renal cell carcinomas or metastatic disease. Metastatic disease also involving Gerota's fascia on the right. Mild abdominal lymphadenopathy, consistent with metastatic disease. Diffuse liver metastases. Small right adrenal mass, suspicious for metastatic disease. Diffuse bilateral pulmonary metastases. Bilateral hilar  lymph node metastases. Diffuse chest and abdominal wall soft tissue metastases. Electronically Signed   By: Marlaine Hind M.D.   On: 10/13/2021 17:53   [place] CORE BIOPSY (SOFT TISSUE)  Result Date: 10/17/2021 INDICATION: 56 year old female with history of left renal mass and multiple diffuse metastatic lesions. EXAM: Ultrasound-guided soft tissue mass biopsy MEDICATIONS: None. ANESTHESIA/SEDATION: Moderate (conscious) sedation was employed during this procedure. A total of Versed 1 mg and Fentanyl 25 mcg was administered intravenously. Moderate Sedation Time: 8 minutes. The patient's level of consciousness and vital signs were monitored continuously by radiology nursing throughout the procedure under my direct supervision. FLUOROSCOPY TIME:  None. COMPLICATIONS: None immediate. PROCEDURE: Informed written consent was obtained from the patient after a thorough discussion of the procedural risks, benefits and alternatives. All questions were  addressed. Maximal Sterile Barrier Technique was utilized including caps, mask, sterile gowns, sterile gloves, sterile drape, hand hygiene and skin antiseptic. A timeout was performed prior to the initiation of the procedure. Preprocedure ultrasound evaluation of the right posterior flank demonstrates subcutaneous hypoechoic soft tissue nodule measuring approximately 2.0 x 1.0 cm. Procedure was planned. Subdermal Local anesthesia was provided at the planned needle entry site. Deeper local anesthetic was administered under ultrasound guidance to the periphery of the targeted mass. A small skin nick was made. Under direct ultrasound visualization, a 17 gauge introducer needle was directed to the periphery of the mass. Next, a total of 3, 18 gauge core biopsies were obtained and placed in formalin. The introducer needle was removed. Postprocedure image demonstrated no evidence of surrounding hematoma or other procedure related complication. The patient tolerated the procedure well and was transferred back to the floor in stable condition. IMPRESSION: Technically successful ultrasound-guided core needle biopsy of right posterior flank subcutaneous soft tissue mass. Ruthann Cancer, MD [facility] Electronically Signed   By: Ruthann Cancer M.D.   On: 10/17/2021 12:02   VAS [place] LOWER EXTREMITY VENOUS (DVT) (7a-7p)  Result Date: 10/23/2021  Lower Venous DVT Study Patient Name:  ANDREW SORIA  Date of Exam:   10/23/2021 Medical Rec #: [id number]        Accession #:    [id number] Date of Birth: 08/23/1966       Patient Gender: F Patient Age:   64 years Exam Location:  [facility] Procedure:      VAS [place] LOWER EXTREMITY VENOUS (DVT) Referring Phys: Wille Glaser KNAPP --------------------------------------------------------------------------------  Indications: Swelling of left lower extremity.  Limitations: Poor ultrasound/tissue interface. Comparison Study: 07-17-2017  Prior bilateral lower extremity venous was negative                   for DVT. Performing Technologist: Darlin Coco RDMS, RVT  Examination Guidelines: A complete evaluation includes B-mode imaging, spectral Doppler, color Doppler, and power Doppler as needed of all accessible portions of each vessel. Bilateral testing is considered an integral part of a complete examination. Limited examinations for reoccurring indications may be performed as noted. The reflux portion of the exam is performed with the patient in reverse Trendelenburg.  +-----+---------------+---------+-----------+----------+--------------+  RIGHT Compressibility Phasicity Spontaneity Properties Thrombus Aging  +-----+---------------+---------+-----------+----------+--------------+  CFV   Full            Yes       Yes                                    +-----+---------------+---------+-----------+----------+--------------+   +---------+---------------+---------+-----------+----------+-------------------+  LEFT      Compressibility Phasicity Spontaneity Properties Thrombus Aging       +---------+---------------+---------+-----------+----------+-------------------+  CFV       Full            Yes       Yes                                         +---------+---------------+---------+-----------+----------+-------------------+  SFJ       Full                                                                  +---------+---------------+---------+-----------+----------+-------------------+  FV Prox   Full                                                                  +---------+---------------+---------+-----------+----------+-------------------+  FV Mid    Full                                                                  +---------+---------------+---------+-----------+----------+-------------------+  FV Distal Full                                                                   +---------+---------------+---------+-----------+----------+-------------------+  PFV       Full                                                                  +---------+---------------+---------+-----------+----------+-------------------+  POP       Full            Yes       Yes                                         +---------+---------------+---------+-----------+----------+-------------------+  PTV                                                        Not well visualized  +---------+---------------+---------+-----------+----------+-------------------+  PERO  Not well visualized  +---------+---------------+---------+-----------+----------+-------------------+    Summary: RIGHT: - No evidence of common femoral vein obstruction.  LEFT: - There is no evidence of deep vein thrombosis in the lower extremity. However, portions of this examination were limited- see technologist comments above.  - No cystic structure found in the popliteal fossa.  *See table(s) above for measurements and observations.    Preliminary       IMPRESSION/PLAN: This is a very pleasant 56 year old with metastatic disease to the brain.  Again, today, I talked to the patient about the findings and work-up thus far.  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  We discussed the available radiation techniques, and focused on the details of logistics and delivery.   Her daughter, Julianne Rice, was on the speaker phone for this discussion.  We reviewed her  3T MRI at tumor board and determined she is a candidate for targeted stereotactic radiosurgery. We discussed the pros and cons of this treatment vs WBRT.  I recommend SRS. We discussed possible brain injury, radionecrosis, fatigue, hair loss, HA, neurologic decline. She is  enthusiastic about proceeding with Edna. Consent form was signed today.    CT simulation today, treatment starting next week. Anticipate 3 fractions.  If her condition worsens, craniotomy/resection of dominant lesion is a possibility.   On date of service, in total, I spent 35 minutes on this encounter. Patient was seen in person.  __________________________________________   Eppie Gibson, MD  This document serves as a record of services personally performed by Eppie Gibson, MD. It was created on her behalf by Roney Mans, a trained medical scribe. The creation of this record is based on the scribe's personal observations and the provider's statements to them. This document has been checked and approved by the attending provider.

## 2021-10-24 ENCOUNTER — Ambulatory Visit
Admit: 2021-10-24 | Discharge: 2021-10-24 | Disposition: A | Discharge status: Home / Self Care | Payer: Medicaid Other | Attending: Radiation Oncology | Admitting: Radiation Oncology

## 2021-10-24 ENCOUNTER — Ambulatory Visit: Payer: Medicaid Other

## 2021-10-24 ENCOUNTER — Emergency Department (HOSPITAL_COMMUNITY): Payer: Medicaid Other

## 2021-10-24 ENCOUNTER — Encounter (HOSPITAL_COMMUNITY): Payer: Self-pay | Admitting: Internal Medicine

## 2021-10-24 ENCOUNTER — Other Ambulatory Visit: Payer: Self-pay

## 2021-10-24 ENCOUNTER — Encounter: Payer: Self-pay | Admitting: Radiation Oncology

## 2021-10-24 DIAGNOSIS — R609 Edema, unspecified: Secondary | ICD-10-CM

## 2021-10-24 DIAGNOSIS — I5033 Acute on chronic diastolic (congestive) heart failure: Secondary | ICD-10-CM

## 2021-10-24 DIAGNOSIS — E1151 Type 2 diabetes mellitus with diabetic peripheral angiopathy without gangrene: Secondary | ICD-10-CM | POA: Diagnosis present

## 2021-10-24 DIAGNOSIS — Z20822 Contact with and (suspected) exposure to covid-19: Secondary | ICD-10-CM | POA: Diagnosis present

## 2021-10-24 DIAGNOSIS — C7889 Secondary malignant neoplasm of other digestive organs: Secondary | ICD-10-CM | POA: Diagnosis present

## 2021-10-24 DIAGNOSIS — Z89432 Acquired absence of left foot: Secondary | ICD-10-CM | POA: Diagnosis not present

## 2021-10-24 DIAGNOSIS — Z801 Family history of malignant neoplasm of trachea, bronchus and lung: Secondary | ICD-10-CM | POA: Diagnosis not present

## 2021-10-24 DIAGNOSIS — Z9049 Acquired absence of other specified parts of digestive tract: Secondary | ICD-10-CM | POA: Diagnosis not present

## 2021-10-24 DIAGNOSIS — F17210 Nicotine dependence, cigarettes, uncomplicated: Secondary | ICD-10-CM | POA: Diagnosis present

## 2021-10-24 DIAGNOSIS — D62 Acute posthemorrhagic anemia: Secondary | ICD-10-CM | POA: Diagnosis present

## 2021-10-24 DIAGNOSIS — Z79899 Other long term (current) drug therapy: Secondary | ICD-10-CM | POA: Diagnosis not present

## 2021-10-24 DIAGNOSIS — C642 Malignant neoplasm of left kidney, except renal pelvis: Secondary | ICD-10-CM | POA: Diagnosis present

## 2021-10-24 DIAGNOSIS — I10 Essential (primary) hypertension: Secondary | ICD-10-CM

## 2021-10-24 DIAGNOSIS — C649 Malignant neoplasm of unspecified kidney, except renal pelvis: Secondary | ICD-10-CM

## 2021-10-24 DIAGNOSIS — E875 Hyperkalemia: Secondary | ICD-10-CM | POA: Diagnosis present

## 2021-10-24 DIAGNOSIS — I110 Hypertensive heart disease with heart failure: Secondary | ICD-10-CM | POA: Diagnosis present

## 2021-10-24 DIAGNOSIS — E1140 Type 2 diabetes mellitus with diabetic neuropathy, unspecified: Secondary | ICD-10-CM | POA: Diagnosis present

## 2021-10-24 DIAGNOSIS — C7931 Secondary malignant neoplasm of brain: Secondary | ICD-10-CM

## 2021-10-24 DIAGNOSIS — E872 Acidosis, unspecified: Secondary | ICD-10-CM | POA: Diagnosis present

## 2021-10-24 DIAGNOSIS — I6523 Occlusion and stenosis of bilateral carotid arteries: Secondary | ICD-10-CM | POA: Diagnosis present

## 2021-10-24 DIAGNOSIS — C799 Secondary malignant neoplasm of unspecified site: Secondary | ICD-10-CM

## 2021-10-24 DIAGNOSIS — Z794 Long term (current) use of insulin: Secondary | ICD-10-CM

## 2021-10-24 DIAGNOSIS — Z9641 Presence of insulin pump (external) (internal): Secondary | ICD-10-CM | POA: Diagnosis present

## 2021-10-24 DIAGNOSIS — C7901 Secondary malignant neoplasm of right kidney and renal pelvis: Secondary | ICD-10-CM | POA: Diagnosis present

## 2021-10-24 DIAGNOSIS — I619 Nontraumatic intracerebral hemorrhage, unspecified: Secondary | ICD-10-CM | POA: Diagnosis present

## 2021-10-24 DIAGNOSIS — E1152 Type 2 diabetes mellitus with diabetic peripheral angiopathy with gangrene: Secondary | ICD-10-CM

## 2021-10-24 DIAGNOSIS — G936 Cerebral edema: Secondary | ICD-10-CM | POA: Diagnosis present

## 2021-10-24 DIAGNOSIS — Z89511 Acquired absence of right leg below knee: Secondary | ICD-10-CM | POA: Diagnosis not present

## 2021-10-24 DIAGNOSIS — R262 Difficulty in walking, not elsewhere classified: Secondary | ICD-10-CM | POA: Diagnosis present

## 2021-10-24 DIAGNOSIS — I5031 Acute diastolic (congestive) heart failure: Secondary | ICD-10-CM | POA: Diagnosis not present

## 2021-10-24 LAB — BASIC METABOLIC PANEL
Anion gap: 9 (ref 5–15)
BUN: 31 mg/dL — ABNORMAL HIGH (ref 6–20)
CO2: 21 mmol/L — ABNORMAL LOW (ref 22–32)
Calcium: 8.6 mg/dL — ABNORMAL LOW (ref 8.9–10.3)
Chloride: 106 mmol/L (ref 98–111)
Creatinine, Ser: 1.34 mg/dL — ABNORMAL HIGH (ref 0.44–1.00)
GFR, Estimated: 47 mL/min — ABNORMAL LOW (ref >60)
Glucose, Bld: 219 mg/dL — ABNORMAL HIGH (ref 70–99)
Potassium: 3.4 mmol/L — ABNORMAL LOW (ref 3.5–5.1)
Sodium: 136 mmol/L (ref 135–145)

## 2021-10-24 LAB — CBG MONITORING, ED
Glucose-Capillary: 223 mg/dL — ABNORMAL HIGH (ref 70–99)
Glucose-Capillary: 200 mg/dL — ABNORMAL HIGH (ref 70–99)

## 2021-10-24 LAB — CBC
HCT: 27.2 % — ABNORMAL LOW (ref 36.0–46.0)
Hemoglobin: 7.9 g/dL — ABNORMAL LOW (ref 12.0–15.0)
MCH: 24 pg — ABNORMAL LOW (ref 26.0–34.0)
MCHC: 29 g/dL — ABNORMAL LOW (ref 30.0–36.0)
MCV: 82.7 fL (ref 80.0–100.0)
Platelets: 387 10*3/uL (ref 150–400)
RBC: 3.29 MIL/uL — ABNORMAL LOW (ref 3.87–5.11)
RDW: 20.3 % — ABNORMAL HIGH (ref 11.5–15.5)
WBC: 5.7 10*3/uL (ref 4.0–10.5)
nRBC: 0 % (ref 0.0–0.2)

## 2021-10-24 LAB — GLUCOSE, CAPILLARY
Glucose-Capillary: 231 mg/dL — ABNORMAL HIGH (ref 70–99)
Glucose-Capillary: 286 mg/dL — ABNORMAL HIGH (ref 70–99)

## 2021-10-24 LAB — RESP PANEL BY RT-PCR (FLU A&B, COVID) ARPGX2
Influenza A by PCR: NEGATIVE
Influenza B by PCR: NEGATIVE
SARS Coronavirus 2 by RT PCR: NEGATIVE

## 2021-10-24 IMAGING — CT CT HEAD W/O CM
3 series · 14 of 47 positions shown, 16 images · non-contrast
Comparison: MRI brain [DATE].  CT head [DATE]

CLINICAL DATA: Brain/CNS neoplasm to assess treatment response.



[Series 2: head wo · axial · 0.51mm/px · z∈[-161,-31]mm · 8 of 32 slices shown, 10 images]
[im 3/32  brain]
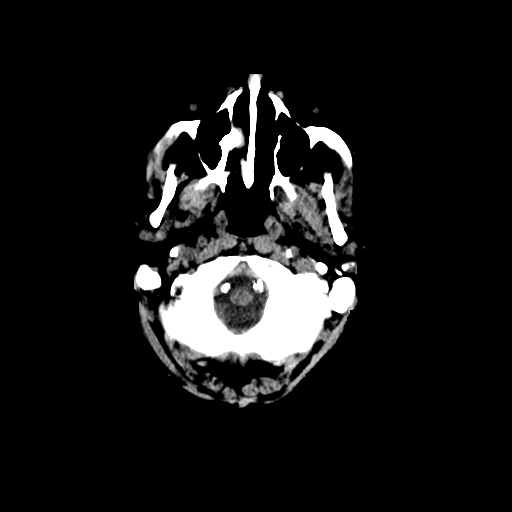
[im 3/32  bone]
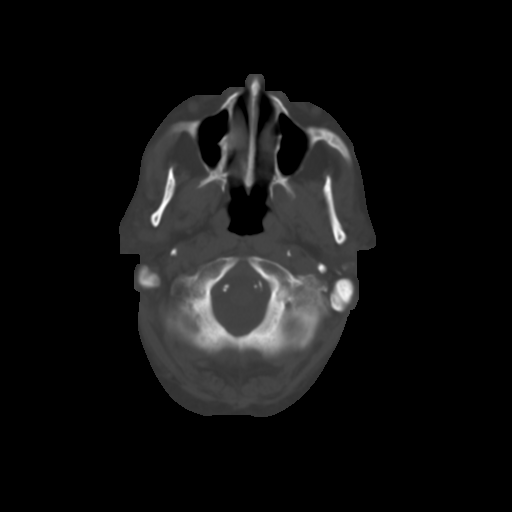
[im 7/32  brain]
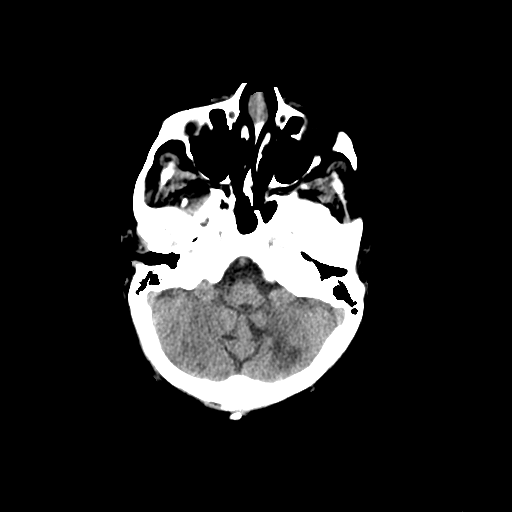
[im 10/32  brain]
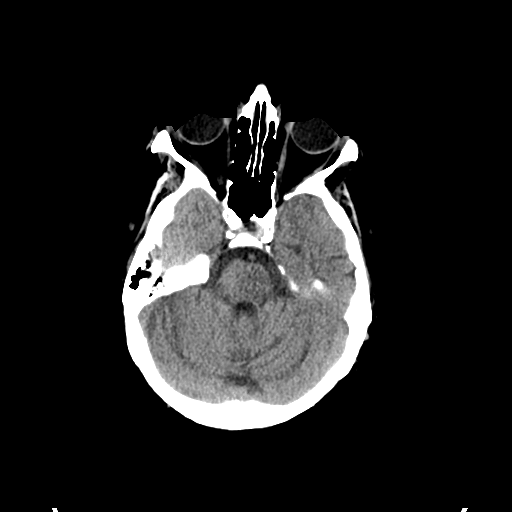
[im 14/32  brain]
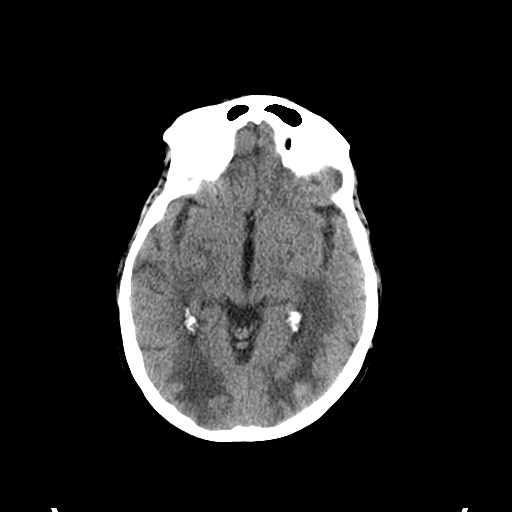
[im 18/32  brain]
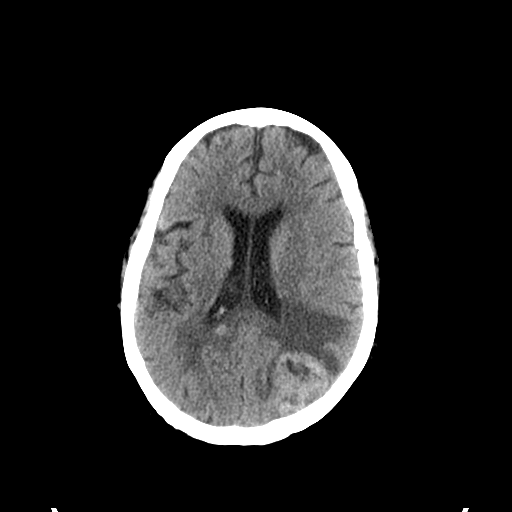
[im 18/32  bone]
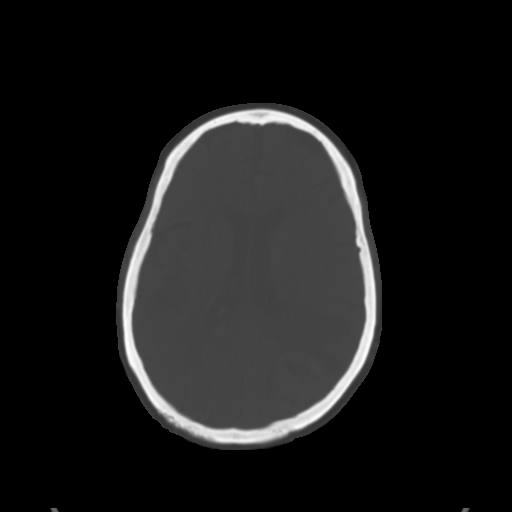
[im 22/32  brain]
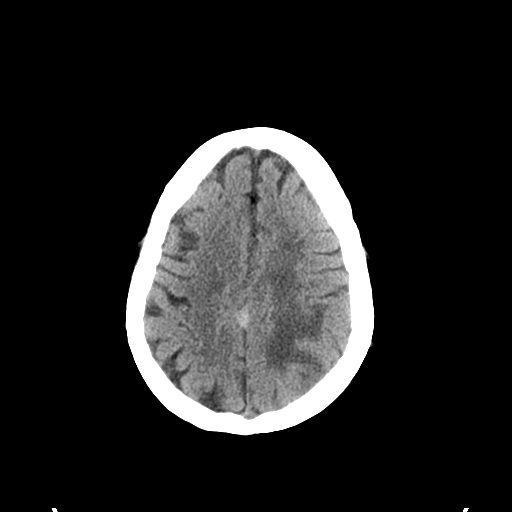
[im 25/32  brain]
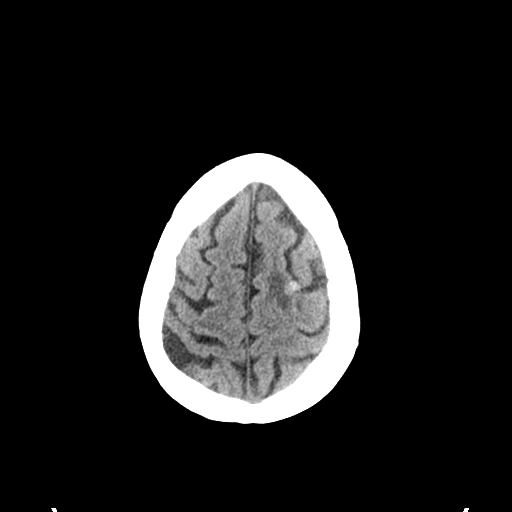
[im 29/32  brain]
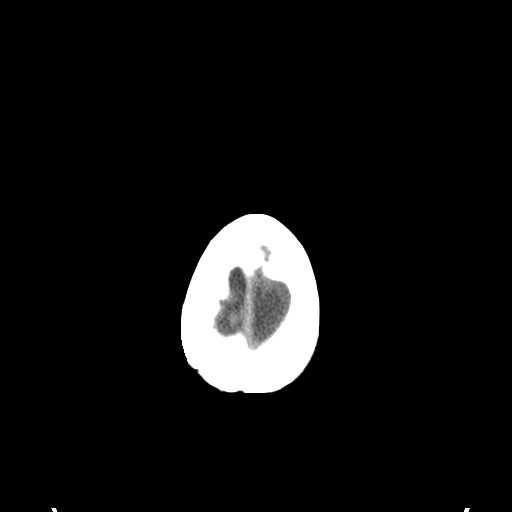

[Series 4: coronal soft tissue · coronal · 0.31mm/px · 3 of 63 slices shown]
[im 21/63  brain]
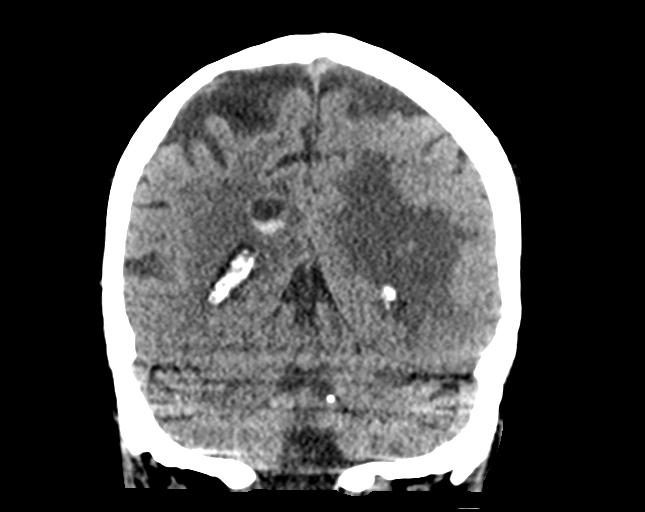
[im 28/63  brain]
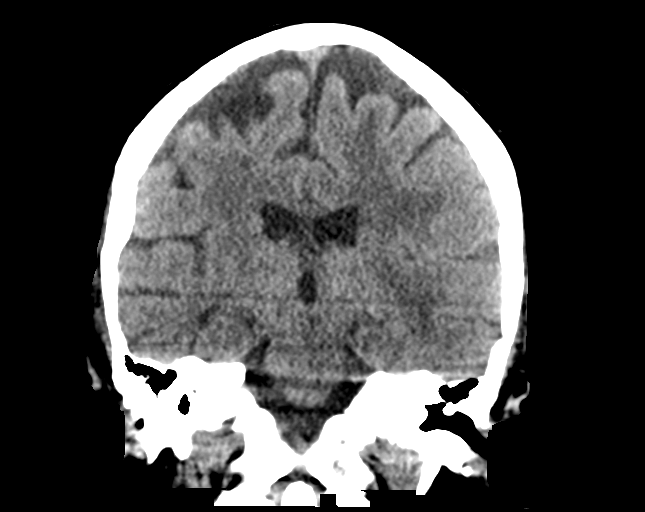
[im 35/63  brain]
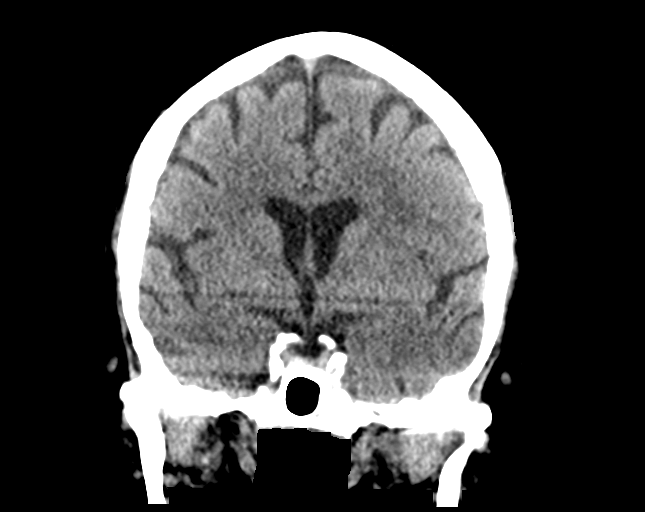

[Series 5: sagittal soft tissue · sagittal · 0.34mm/px · 3 of 48 slices shown]
[im 16/48  brain]
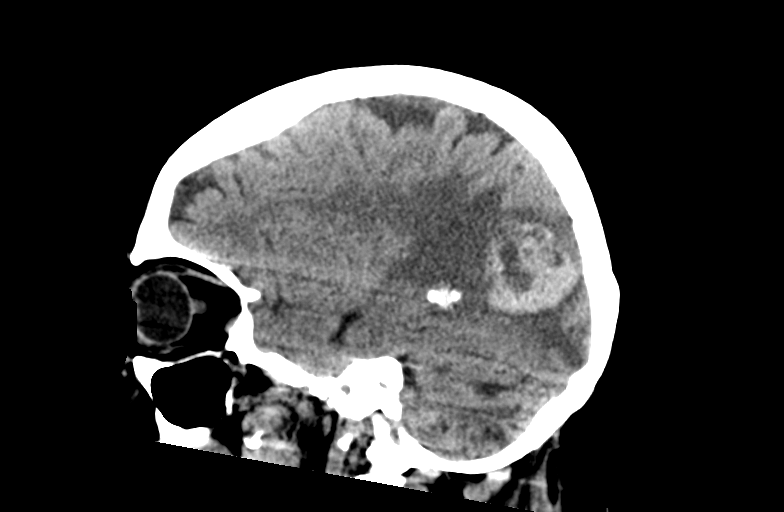
[im 24/48  brain]
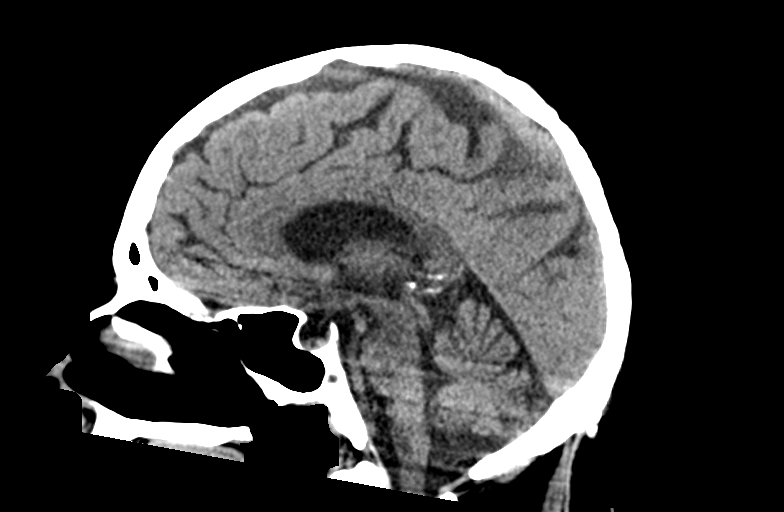
[im 32/48  brain]
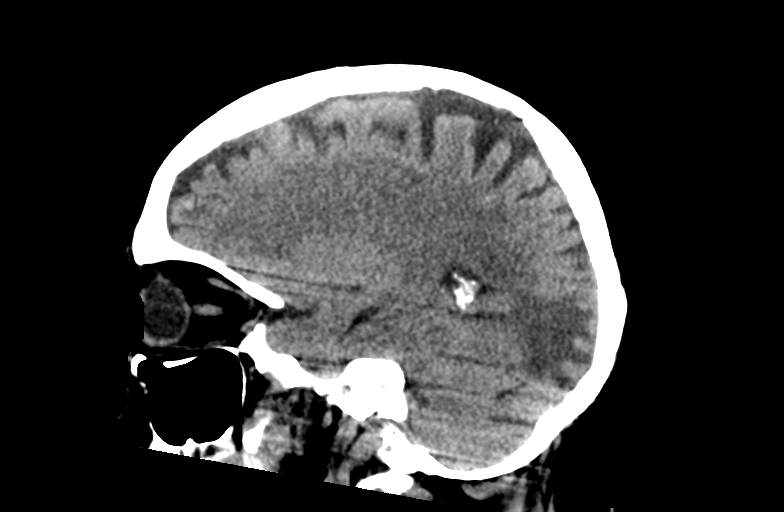

[14 of 47 positions shown; findings below may reference images not displayed]

FINDINGS: Brain: Multiple intracranial metastases are again demonstrated.
Largest is a heterogeneous mixed density but mostly hyperdense
lesion in the left parietal lobe measuring 3 x 3.7 cm diameter. Size
measures larger than on previous CT. There is surrounding vasogenic
edema as before. Multiple additional mostly hyperdense lesions are
also demonstrated. Lesions are more numerous and larger than on the
previous CT but were demonstrated on the prior MRI. Hyperdensity in
the lesions suggest hemorrhagic component. No mass effect or midline
shift. No abnormal extra-axial fluid collections. Ventricles are not
dilated.

Vascular: Intracranial arterial vascular calcifications are present.

Skull: Normal. Negative for fracture or focal lesion.

Sinuses/Orbits: Paranasal sinuses and mastoid air cells are clear.

Other: None.
IMPRESSION: Multiple intracranial metastases, some hemorrhagic, and associated
vasogenic white matter edema demonstrating progression since prior
CT, possibly due to intervening hemorrhage within the lesions. Size
and distribution of the lesions is similar to the interval MRI.
Greater than 10 lesions are present. No acute mass effect.

## 2021-10-24 MED ORDER — ACETAMINOPHEN 325 MG PO TABS
650.0000 mg | ORAL_TABLET | Freq: Four times a day (QID) | ORAL | Status: DC | PRN
  Administered 2021-10-24: 650 mg via ORAL
  Filled 2021-10-24: qty 2

## 2021-10-24 MED ORDER — POTASSIUM CHLORIDE CRYS ER 20 MEQ PO TBCR
40.0000 meq | EXTENDED_RELEASE_TABLET | Freq: Every day | ORAL | Status: DC
  Administered 2021-10-24 – 2021-10-27 (×4): 40 meq via ORAL
  Filled 2021-10-24 (×4): qty 2

## 2021-10-24 MED ORDER — SIMVASTATIN 40 MG PO TABS
40.0000 mg | ORAL_TABLET | Freq: Every day | ORAL | Status: DC
  Administered 2021-10-24 – 2021-10-29 (×6): 40 mg via ORAL
  Filled 2021-10-24 (×6): qty 1

## 2021-10-24 MED ORDER — LORAZEPAM 2 MG/ML IJ SOLN
1.0000 mg | Freq: Once | INTRAMUSCULAR | Status: AC
  Administered 2021-10-24: 1 mg via INTRAVENOUS
  Filled 2021-10-24: qty 1

## 2021-10-24 MED ORDER — SUCRALFATE 1 GM/10ML PO SUSP
1.0000 g | Freq: Three times a day (TID) | ORAL | Status: DC
  Administered 2021-10-24 – 2021-10-30 (×27): 1 g via ORAL
  Filled 2021-10-24 (×27): qty 10

## 2021-10-24 MED ORDER — ONDANSETRON HCL 4 MG/2ML IJ SOLN
4.0000 mg | Freq: Four times a day (QID) | INTRAMUSCULAR | Status: DC | PRN
  Administered 2021-10-30: 4 mg via INTRAVENOUS
  Filled 2021-10-24: qty 2

## 2021-10-24 MED ORDER — ONDANSETRON HCL 4 MG PO TABS: 4.0000 mg | ORAL_TABLET | Freq: Four times a day (QID) | ORAL | Status: DC | PRN

## 2021-10-24 MED ORDER — FUROSEMIDE 10 MG/ML IJ SOLN
40.0000 mg | Freq: Every day | INTRAMUSCULAR | Status: DC
  Administered 2021-10-24 – 2021-10-26 (×3): 40 mg via INTRAVENOUS
  Filled 2021-10-24 (×3): qty 4

## 2021-10-24 MED ORDER — DEXAMETHASONE SODIUM PHOSPHATE 4 MG/ML IJ SOLN
4.0000 mg | Freq: Four times a day (QID) | INTRAMUSCULAR | Status: DC
  Administered 2021-10-24 – 2021-10-30 (×27): 4 mg via INTRAVENOUS
  Filled 2021-10-24 (×27): qty 1

## 2021-10-24 MED ORDER — DOCUSATE SODIUM 100 MG PO CAPS
100.0000 mg | ORAL_CAPSULE | Freq: Two times a day (BID) | ORAL | Status: DC | PRN
  Administered 2021-10-28: 100 mg via ORAL
  Filled 2021-10-24 (×2): qty 1

## 2021-10-24 MED ORDER — HYDRALAZINE HCL 20 MG/ML IJ SOLN
10.0000 mg | INTRAMUSCULAR | Status: DC | PRN
  Administered 2021-10-24 – 2021-10-26 (×3): 20 mg via INTRAVENOUS
  Administered 2021-10-28: 10 mg via INTRAVENOUS
  Filled 2021-10-24 (×4): qty 1

## 2021-10-24 MED ORDER — EMPAGLIFLOZIN 25 MG PO TABS
25.0000 mg | ORAL_TABLET | Freq: Every day | ORAL | Status: DC
  Administered 2021-10-24: 25 mg via ORAL
  Filled 2021-10-24: qty 1

## 2021-10-24 MED ORDER — INSULIN ASPART 100 UNIT/ML IJ SOLN
0.0000 [IU] | Freq: Three times a day (TID) | INTRAMUSCULAR | Status: DC
  Administered 2021-10-24: 3 [IU] via SUBCUTANEOUS
  Administered 2021-10-24 – 2021-10-25 (×3): 5 [IU] via SUBCUTANEOUS
  Administered 2021-10-25 – 2021-10-26 (×2): 8 [IU] via SUBCUTANEOUS
  Administered 2021-10-26: 5 [IU] via SUBCUTANEOUS
  Administered 2021-10-26 – 2021-10-27 (×2): 8 [IU] via SUBCUTANEOUS
  Administered 2021-10-27: 3 [IU] via SUBCUTANEOUS
  Administered 2021-10-27: 8 [IU] via SUBCUTANEOUS
  Administered 2021-10-28 – 2021-10-29 (×6): 5 [IU] via SUBCUTANEOUS
  Administered 2021-10-30: 15 [IU] via SUBCUTANEOUS
  Administered 2021-10-30: 11 [IU] via SUBCUTANEOUS
  Administered 2021-10-30: 3 [IU] via SUBCUTANEOUS
  Filled 2021-10-24: qty 0.15

## 2021-10-24 MED ORDER — PANTOPRAZOLE SODIUM 40 MG PO TBEC
40.0000 mg | DELAYED_RELEASE_TABLET | Freq: Two times a day (BID) | ORAL | Status: DC
  Administered 2021-10-24 – 2021-10-30 (×13): 40 mg via ORAL
  Filled 2021-10-24 (×13): qty 1

## 2021-10-24 MED ORDER — INSULIN DETEMIR 100 UNIT/ML ~~LOC~~ SOLN
10.0000 [IU] | Freq: Every day | SUBCUTANEOUS | Status: DC
  Administered 2021-10-24 – 2021-10-25 (×2): 10 [IU] via SUBCUTANEOUS
  Filled 2021-10-24 (×3): qty 0.1

## 2021-10-24 MED ORDER — ACETAMINOPHEN 650 MG RE SUPP: 650.0000 mg | Freq: Four times a day (QID) | RECTAL | Status: DC | PRN

## 2021-10-24 MED ORDER — POLYETHYLENE GLYCOL 3350 17 G PO PACK
17.0000 g | PACK | Freq: Two times a day (BID) | ORAL | Status: DC | PRN
  Administered 2021-10-28: 17 g via ORAL
  Filled 2021-10-24: qty 1

## 2021-10-24 MED ORDER — CARVEDILOL 3.125 MG PO TABS
3.1250 mg | ORAL_TABLET | Freq: Two times a day (BID) | ORAL | Status: DC
  Administered 2021-10-24 (×2): 3.125 mg via ORAL
  Filled 2021-10-24 (×2): qty 1

## 2021-10-24 MED ORDER — LABETALOL HCL 5 MG/ML IV SOLN: 10.0000 mg | INTRAVENOUS | Status: DC | PRN

## 2021-10-24 MED ORDER — ADULT MULTIVITAMIN W/MINERALS CH
1.0000 | ORAL_TABLET | Freq: Every day | ORAL | Status: DC
  Administered 2021-10-24 – 2021-10-30 (×7): 1 via ORAL
  Filled 2021-10-24 (×7): qty 1

## 2021-10-24 NOTE — Assessment & Plan Note (Addendum)
Patient with increased peripheral edema from baseline.  Initially received IV Lasix.  This has been changed to oral Lasix at this time  Creatinine at 1.0 from 1.4<1.4<1.3< 1.3.  2D echocardiogram from 10/25/2021 showed LV ejection fraction of 55 to 60% with grade 3 diastolic dysfunction. Edema has improved.  We will continue oral Lasix on discharge.

## 2021-10-24 NOTE — Hospital Course (Addendum)
Natalie Contreras is a 56 y.o. 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  She was supposed to go for radiation oncology appointment but could not go so she decided to come to the hospital.  In the ED patient had a CT scan done which showed multiple intracranial metastasis some hemorrhagic and some with associated vasogenic edema which was progressive in nature.  Patient was then considered for admission to hospital for further evaluation treatment and assessment.  During hospitalization, patient was seen by radiation oncology and underwent simulation for radiation treatment on 10/24/2021.  Initial recommendation was skilled nursing facility placement but subsequently was considered for home with home health.

## 2021-10-24 NOTE — Progress Notes (Signed)
PROGRESS NOTE    Natalie Contreras  FMB:[id number] DOB: Sep 07, 1966 DOA: 10/23/2021 PCP: Natalie Squibb, MD    Brief Narrative:  Natalie Contreras is a 56 y.o. 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  She was supposed to go for radiation oncology appointment but could not go so she decided to come to the hospital.  In the ED patient had a CT scan done which showed multiple intracranial metastasis some hemorrhagic and some with associated vasogenic edema which was progressive in nature.  Patient was then considered for admission to hospital for further evaluation treatment and assessment.    Assessment and Plan: * Ambulatory dysfunction- (present on admission) Patient is right below-knee amputee uses prosthesis at baseline but has been having difficulty ambulating recently.  CT scan shows hemorrhagic metastasis with vasogenic edema.  Radiation oncology has plans for simulation and radiation treatment to be initiated in the hospital.  We will also get PT evaluation.  Metastatic cancer to brain [facility])- (present on admission) CT head scan showed worsened / increased appearance of lesions on CT possibly due to increased hemorrhage / vasogenic edema.  Patient has been started on IV Decadron.  Plan for radiation to be initiated soon.  Radiation oncology on board.  Renal cell carcinoma ([place])- (present on admission) With brain and gastric metastasis.  Continue radiation treatment.  Hematology and radiation oncology has been notified.  DM2 (diabetes mellitus, type 2) (HCC) Mild acidosis noted.  Hold Jardiance.  Continue sliding scale insulin, latest  hemoglobin A1c was 5.4.  Patient was on Levemir in the past but was discontinued due to hypoglycemia.  Currently on Decadron so we will add low-dose Levemir.  Acute blood loss anemia- (present on admission) Hemoglobin was 7.7 on last discharge.  Hemoglobin today at 7.9.  Patient does have history of GI bleed from fungating ulcer in the stomach which was confirmed to be metastatic renal cell carcinoma.   Continue CBC daily.  Acute on chronic diastolic CHF (congestive heart failure) ([place])- (present on admission) Patient with increased peripheral edema from baseline.  Continue with IV diuretics.  Closely monitor BMP.  Denies any increasing shortness of breath.  Benign essential HTN- (present on admission) Continue Coreg, as needed hydralazine.    DVT prophylaxis: SCDs Start: 10/24/21 0422   Code Status:     Code Status: Full Code  Disposition: Skilled nursing facility as per PT evaluation.  Status is: Inpatient  Remains inpatient appropriate because: Ambulatory dysfunction, need for placement, ongoing radiation treatment   Family Communication: I spoke with the patient's daughter on the phone and updated her about the clinical condition of the patient.  Consultants:  Radiation oncology Hematooncology   Procedures:  Radiation treatment  Antimicrobials:  None  Anti-infectives (From admission, onward)    None       Subjective: Today, patient was seen and examined at bedside.  Complains of difficulty ambulating at home.  Normally uses a prosthetic foot but complains of increasing swelling of the lower extremity.  Denies any shortness of breath chest pain.  Objective: Vitals:   10/24/21 0700 10/24/21 0930 10/24/21 1100 10/24/21 1130  BP: (!) 169/57 (!) 185/68 122/72 (!) 182/63  Pulse: 77 80 73 77  Resp: 18 18  17 18  Temp:      SpO2: 100% 99% 99% 100%   No intake or output data in the 24 hours ending 10/24/21 1357 There were no vitals filed for this visit.  Physical  Examination:  General: Obese built, not in obvious distress, on nasal cannula oxygen HENT:   No scleral pallor or icterus noted. Oral mucosa is moist.  Chest:    Diminished breath sounds bilaterally. No crackles or wheezes.  CVS: S1 &S2 heard. No murmur.  Regular rate and rhythm. Abdomen: Soft, nontender, nondistended.  Bowel sounds are heard.   Extremities: No cyanosis, clubbing with left lower extremity gross edema, right below-knee amputation.  Peripheral pulses are palpable. Psych: Alert, awake and oriented, normal mood CNS:  No cranial nerve deficits.  Weakness on the left lower extremity. Skin: Warm and dry.  No rashes noted.  Data Reviewed:   CBC: Recent Labs  Lab 10/23/21 1320 10/24/21 0431  WBC 5.7 5.7  NEUTROABS 4.0  --   HGB 8.6* 7.9*  HCT 30.1* 27.2*  MCV 85.3 82.7  PLT 394 355    Basic Metabolic Panel: Recent Labs  Lab 10/23/21 1320 10/24/21 0431  NA 139 136  K 3.1* 3.4*  CL 107 106  CO2 22 21*  GLUCOSE 158* 219*  BUN 34* 31*  CREATININE 1.19* 1.34*  CALCIUM 8.9 8.6*    Liver Function Tests: Recent Labs  Lab 10/23/21 1320  AST 13*  ALT 14  ALKPHOS 140*  BILITOT 0.5  PROT 7.1  ALBUMIN 3.1*     Radiology Studies: CT Head Wo Contrast  Result Date: 10/24/2021 CLINICAL DATA:  Brain/CNS neoplasm to assess treatment response. EXAM: CT HEAD WITHOUT CONTRAST TECHNIQUE: Contiguous axial images were obtained from the base of the skull through the vertex without intravenous contrast. RADIATION DOSE REDUCTION: This exam was performed according to the departmental dose-optimization program which includes automated exposure control, adjustment of the mA and/or kV according to patient size and/or use of iterative reconstruction technique. COMPARISON:  MRI brain 10/19/2021.  CT head 10/13/2021 FINDINGS: Brain: Multiple intracranial metastases are again demonstrated. Largest is a heterogeneous mixed density but mostly hyperdense lesion in the left parietal lobe  measuring 3 x 3.7 cm diameter. Size measures larger than on previous CT. There is surrounding vasogenic edema as before. Multiple additional mostly hyperdense lesions are also demonstrated. Lesions are more numerous and larger than on the previous CT but were demonstrated on the prior MRI. Hyperdensity in the lesions suggest hemorrhagic component. No mass effect or midline shift. No abnormal extra-axial fluid collections. Ventricles are not dilated. Vascular: Intracranial arterial vascular calcifications are present. Skull: Normal. Negative for fracture or focal lesion. Sinuses/Orbits: Paranasal sinuses and mastoid air cells are clear. Other: None. IMPRESSION: Multiple intracranial metastases, some hemorrhagic, and associated vasogenic white matter edema demonstrating progression since prior CT, possibly due to intervening hemorrhage within the lesions. Size and distribution of the lesions is similar to the interval MRI. Greater than 10 lesions are present. No acute mass effect. Electronically Signed   By: Lucienne Capers M.D.   On: 10/24/2021 00:32   VAS [place] LOWER EXTREMITY VENOUS (DVT) (7a-7p)  Result Date: 10/23/2021  Lower Venous DVT Study Patient Name:  Natalie Contreras  Date of Exam:   10/23/2021 Medical Rec #: [id number]        Accession #:    [id number] Date of Birth: 23-Oct-1965       Patient Gender: F Patient Age:   46 years Exam Location:  [facility] Procedure:      VAS [place] LOWER EXTREMITY VENOUS (DVT) Referring Phys: JON KNAPP --------------------------------------------------------------------------------  Indications: Swelling of left lower extremity.  Limitations: Poor ultrasound/tissue interface. Comparison Study: 07-17-2017 Prior bilateral lower extremity venous was negative                   for DVT. Performing Technologist: Darlin Coco RDMS, RVT  Examination Guidelines: A complete evaluation includes B-mode imaging, spectral Doppler, color Doppler, and power Doppler as needed of all  accessible portions of each vessel. Bilateral testing is considered an integral part of a complete examination. Limited examinations for reoccurring indications may be performed as noted. The reflux portion of the exam is performed with the patient in reverse Trendelenburg.  +-----+---------------+---------+-----------+----------+--------------+  RIGHT Compressibility Phasicity Spontaneity Properties Thrombus Aging  +-----+---------------+---------+-----------+----------+--------------+  CFV   Full            Yes       Yes                                    +-----+---------------+---------+-----------+----------+--------------+   +---------+---------------+---------+-----------+----------+-------------------+  LEFT      Compressibility Phasicity Spontaneity Properties Thrombus Aging       +---------+---------------+---------+-----------+----------+-------------------+  CFV       Full            Yes       Yes                                         +---------+---------------+---------+-----------+----------+-------------------+  SFJ       Full                                                                  +---------+---------------+---------+-----------+----------+-------------------+  FV Prox   Full                                                                  +---------+---------------+---------+-----------+----------+-------------------+  FV Mid    Full                                                                  +---------+---------------+---------+-----------+----------+-------------------+  FV Distal Full                                                                  +---------+---------------+---------+-----------+----------+-------------------+  PFV       Full                                                                  +---------+---------------+---------+-----------+----------+-------------------+  POP       Full            Yes       Yes                                          +---------+---------------+---------+-----------+----------+-------------------+  PTV                                                        Not well visualized  +---------+---------------+---------+-----------+----------+-------------------+  PERO                                                       Not well visualized  +---------+---------------+---------+-----------+----------+-------------------+    Summary: RIGHT: - No evidence of common femoral vein obstruction.  LEFT: - There is no evidence of deep vein thrombosis in the lower extremity. However, portions of this examination were limited- see technologist comments above.  - No cystic structure found in the popliteal fossa.  *See table(s) above for measurements and observations.    Preliminary       LOS: 0 days    Flora Lipps, MD [facility] 10/24/2021, 1:57 PM

## 2021-10-24 NOTE — Evaluation (Signed)
Occupational Therapy Evaluation Patient Details Name: Natalie Contreras MRN: [id number] DOB: Jan 27, 1966 Today's Date: 10/24/2021   History of Present Illness 56 y.o. female with medical history significant of DM2 with neuropathy and PAD, R BKA, dCHF, HTN. 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 Pt started on radiation therapy during stay. Pt presented to ED 10/23/21 for evaluation of leg swelling and difficulty with walking.   Clinical Impression   Patient is a 56 year old female who was admitted for above. Patient had recently left the hospital on 2/2 home with roommate support. Patient was noted to have increased edema in LLE and R residual limb impacting participation in ADLs. Patient was unable to clearly report AD available at home and what caregiver support was available at this time. Patient at this time will need 24/7 caregiver support to be successful in next level of care. Patient would continue to benefit from skilled OT services at this time while admitted and after d/c to address noted deficits in order to improve overall safety and independence in ADLs.        Recommendations for follow up therapy are one component of a multi-disciplinary discharge planning process, led by the attending physician.  Recommendations may be updated based on patient status, additional functional criteria and insurance authorization.   Follow Up Recommendations  Skilled nursing-short term rehab (<3 hours/day)    Assistance Recommended at Discharge Frequent or constant Supervision/Assistance  Patient can return home with the following A little help with bathing/dressing/bathroom;Assistance with cooking/housework;Assist for transportation    Functional Status Assessment  Patient has had a recent decline in their functional status and demonstrates the ability to make significant improvements in function in a reasonable  and predictable amount of time.  Equipment Recommendations  None recommended by OT    Recommendations for Other Services       Precautions / Restrictions Precautions Precautions: Fall Precaution Comments: R BKA with prostesis Restrictions Weight Bearing Restrictions: No      Mobility Bed Mobility Overal bed mobility: Needs Assistance Bed Mobility: Supine to Sit     Supine to sit: Min assist, HOB elevated     General bed mobility comments: with increased time to move BLE over to edge of bed    Transfers                          Balance Overall balance assessment: Needs assistance Sitting-balance support: Feet unsupported Sitting balance-Leahy Scale: Fair                                     ADL either performed or assessed with clinical judgement   ADL Overall ADL's : Needs assistance/impaired Eating/Feeding: Set up;Sitting   Grooming: Wash/dry face;Set up;Sitting   Upper Body Bathing: Set up;Sitting   Lower Body Bathing: Maximal assistance;Bed level   Upper Body Dressing : Set up;Sitting   Lower Body Dressing: Bed level;Maximal assistance Lower Body Dressing Details (indicate cue type and reason): edema preventing ROM and ability to don prostesis at this time   Toilet Transfer Details (indicate cue type and reason): unable to attempt with pain in LLE, and edema preventing donning prostesis. Toileting- Clothing Manipulation and Hygiene: Bed level;Maximal assistance       Functional mobility during ADLs: +2 for physical assistance;+2 for safety/equipment  Vision Baseline Vision/History: 1 Wears glasses Patient Visual Report: No change from baseline       Perception     Praxis      Pertinent Vitals/Pain Pain Assessment Pain Assessment: Faces Faces Pain Scale: Hurts little more Pain Location: bottom Pain Descriptors / Indicators: Discomfort, Grimacing Pain Intervention(s): Monitored during session, Repositioned      Hand Dominance Right   Extremity/Trunk Assessment Upper Extremity Assessment Upper Extremity Assessment: Defer to OT evaluation RUE Deficits / Details: AROM WFL over 120 degrees. patient was 4+/5 grasp, 4/5 in shoudlers and elbows. LUE Deficits / Details: AROM WFL over 120 degrees.patient was 4+/5 grasp, 4/5 in shoudlers and elbows.   Lower Extremity Assessment Lower Extremity Assessment: LLE deficits/detail;RLE deficits/detail RLE Deficits / Details: prosthesis will not fit due to edema; pt able to perform R SLR, R knee ext +4/5 RLE Sensation: WNL LLE Deficits / Details: pitting edema L calf, pain with movement LLE: Unable to fully assess due to pain LLE Sensation: decreased light touch   Cervical / Trunk Assessment Cervical / Trunk Assessment: Kyphotic   Communication Communication Communication: No difficulties   Cognition Arousal/Alertness: Awake/alert Behavior During Therapy: WFL for tasks assessed/performed Overall Cognitive Status: Within Functional Limits for tasks assessed                                 General Comments: patient was noted to have some confusion with reporting what AD was available at home. patient unable to identify items with pictures on computer. patient also had conflicting reports on PLOF and plands for d/c location     General Comments       Exercises     Shoulder Instructions      Home Living Family/patient expects to be discharged to:: Unsure Living Arrangements: Children Available Help at Discharge: Family Type of Home: House Home Access: Level entry     Home Layout: One level               Home Equipment: Tub bench;Wheelchair - Publishing copy (2 wheels)   Additional Comments: patient reported having roommate at home. daughter helps with groceries and getting to [facility] apt[facility]      Prior Functioning/Environment Prior Level of Function : Independent/Modified Independent             Mobility  Comments: walks short distances with RW, uses WC in home ADLs Comments: Uses tub transfer bench        OT Problem List: Decreased strength;Decreased activity tolerance;Decreased safety awareness;Impaired balance (sitting and/or standing)      OT Treatment/Interventions: Self-care/ADL training;Therapeutic exercise;Neuromuscular education;DME and/or AE instruction;Therapeutic activities;Patient/family education;Balance training    OT Goals(Current goals can be found in the care plan section) Acute Rehab OT Goals Patient Stated Goal: to go back home OT Goal Formulation: With patient Time For Goal Achievement: 11/07/21 Potential to Achieve Goals: Good  OT Frequency: Min 2X/week    Co-evaluation   Reason for Co-Treatment: Complexity of the patient's impairments (multi-system involvement);For patient/therapist safety PT goals addressed during session: Mobility/safety with mobility;Balance        AM-PAC OT "6 Clicks" Daily Activity     Outcome Measure Help from another person eating meals?: None Help from another person taking care of personal grooming?: A Little Help from another person toileting, which includes using toliet, bedpan, or urinal?: A Lot Help from another person bathing (including washing, rinsing, drying)?: A Lot Help from another person  to put on and taking off regular upper body clothing?: A Little Help from another person to put on and taking off regular lower body clothing?: A Lot 6 Click Score: 16   End of Session Nurse Communication: Mobility status  Activity Tolerance: Patient tolerated treatment well Patient left: in bed;with call bell/phone within reach  OT Visit Diagnosis: Unsteadiness on feet (R26.81);Other abnormalities of gait and mobility (R26.89);Muscle weakness (generalized) (M62.81)                Time: 1792-1783 OT Time Calculation (min): 23 min Charges:  OT General Charges $OT Visit: 1 Visit OT Evaluation $OT Eval Low Complexity: 1  Low  Leota Sauers, MS Acute Rehabilitation Department Office# [phone] Pager# [phone]   Marcellina Millin 10/24/2021, 12:48 PM

## 2021-10-24 NOTE — Assessment & Plan Note (Addendum)
With hyperglycemia on presentation..  Mild acidosis noted on admission so Jardiance was initially kept on hold..  Patient is on insulin pump at home.

## 2021-10-24 NOTE — Evaluation (Signed)
Physical Therapy Evaluation Patient Details Name: Natalie Contreras MRN: [id number] DOB: 03-31-1966 Today's Date: 10/24/2021  History of Present Illness  56 y.o. female with medical history significant of DM2 with neuropathy and PAD, R BKA, dCHF, HTN. 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 Pt started on radiation therapy during stay. Pt presented to ED 10/23/21 for evaluation of leg swelling and difficulty with walking.  Clinical Impression  Pt admitted with above diagnosis. Pt reports she's independent with mobility at home using a walker to ambulate short distances, and a manual WC as well for mobility in the home. Prior to this admission, she was not able to don her RLE prosthesis due to edema in her leg. Today she was able to perform supine to sit, she dangled at the edge of the bed for ~10 minutes. Transfers were deferred 2* pain and significant edema in LLE, and inability to don RLE prosthesis. She stated she plans to move to a different, accessible home soon with her daughter. At present, she needs assistance for mobility.  Pt currently with functional limitations due to the deficits listed below (see PT Problem List). Pt will benefit from skilled PT to increase their independence and safety with mobility to allow discharge to the venue listed below.          Recommendations for follow up therapy are one component of a multi-disciplinary discharge planning process, led by the attending physician.  Recommendations may be updated based on patient status, additional functional criteria and insurance authorization.  Follow Up Recommendations Skilled nursing-short term rehab (<3 hours/day)    Assistance Recommended at Discharge Intermittent Supervision/Assistance  Patient can return home with the following  A lot of help with walking and/or transfers;A lot of help with bathing/dressing/bathroom;Assistance with  cooking/housework;Assist for transportation;Direct supervision/assist for medications management;Direct supervision/assist for financial management;Help with stairs or ramp for entrance    Equipment Recommendations None recommended by PT  Recommendations for Other Services       Functional Status Assessment Patient has had a recent decline in their functional status and demonstrates the ability to make significant improvements in function in a reasonable and predictable amount of time.     Precautions / Restrictions Precautions Precautions: Fall Precaution Comments: R BKA with prostesis Restrictions Weight Bearing Restrictions: No      Mobility  Bed Mobility Overal bed mobility: Needs Assistance Bed Mobility: Supine to Sit, Sit to Supine     Supine to sit: Min assist, HOB elevated Sit to supine: Min assist, HOB elevated   General bed mobility comments: with increased time to move BLE over to edge of bed    Transfers                   General transfer comment: deferred, pt unable to don RLE prosthesis due to edema; LLE has significant edema and is painful with movement    Ambulation/Gait                  Stairs            Wheelchair Mobility    Modified Rankin (Stroke Patients Only)       Balance Overall balance assessment: Needs assistance Sitting-balance support: Feet unsupported Sitting balance-Leahy Scale: Fair  Pertinent Vitals/Pain Pain Assessment Pain Score: 4  Pain Location: LLE with movement and bottom Pain Descriptors / Indicators: Discomfort, Grimacing Pain Intervention(s): Limited activity within patient's tolerance, Monitored during session, Repositioned    Home Living Family/patient expects to be discharged to:: Unsure Living Arrangements: Children Available Help at Discharge: Family Type of Home: House Home Access: Level entry       Home Layout: One level Home  Equipment: Tub bench;Wheelchair - Publishing copy (2 wheels) Additional Comments: patient reported having roommate at home. daughter helps with groceries and getting to [facility] apt[facility]    Prior Function Prior Level of Function : Independent/Modified Independent             Mobility Comments: walks short distances with RW, uses WC in home ADLs Comments: Uses tub transfer bench     Hand Dominance   Dominant Hand: Right    Extremity/Trunk Assessment   Upper Extremity Assessment Upper Extremity Assessment: Defer to OT evaluation RUE Deficits / Details: AROM WFL over 120 degrees. patient was 4+/5 grasp, 4/5 in shoudlers and elbows. LUE Deficits / Details: AROM WFL over 120 degrees.patient was 4+/5 grasp, 4/5 in shoudlers and elbows.    Lower Extremity Assessment Lower Extremity Assessment: LLE deficits/detail;RLE deficits/detail RLE Deficits / Details: prosthesis will not fit due to edema; pt able to perform R SLR, R knee ext +4/5 RLE Sensation: WNL LLE Deficits / Details: pitting edema L calf, pain with movement LLE: Unable to fully assess due to pain LLE Sensation: decreased light touch    Cervical / Trunk Assessment Cervical / Trunk Assessment: Kyphotic  Communication   Communication: No difficulties  Cognition Arousal/Alertness: Awake/alert Behavior During Therapy: WFL for tasks assessed/performed Overall Cognitive Status: Within Functional Limits for tasks assessed                                 General Comments: patient was noted to have some confusion with reporting what AD was available at home. patient unable to identify items with pictures on computer. patient also had conflicting reports on PLOF and plans for d/c location        General Comments      Exercises     Assessment/Plan    PT Assessment Patient needs continued PT services  PT Problem List Decreased strength;Decreased range of motion;Decreased activity tolerance;Decreased  balance;Decreased mobility;Decreased coordination;Decreased knowledge of use of DME;Cardiopulmonary status limiting activity       PT Treatment Interventions DME instruction;Gait training;Stair training;Functional mobility training;Therapeutic activities;Therapeutic exercise;Balance training;Patient/family education;Wheelchair mobility training    PT Goals (Current goals can be found in the Care Plan section)  Acute Rehab PT Goals Patient Stated Goal: plans to move to an accessable home with her daughter soon PT Goal Formulation: With patient Time For Goal Achievement: 11/07/21 Potential to Achieve Goals: Fair    Frequency Min 3X/week     Co-evaluation PT/OT/SLP Co-Evaluation/Treatment: Yes Reason for Co-Treatment: Complexity of the patient's impairments (multi-system involvement);For patient/therapist safety PT goals addressed during session: Mobility/safety with mobility;Balance         AM-PAC PT "6 Clicks" Mobility  Outcome Measure Help needed turning from your back to your side while in a flat bed without using bedrails?: A Little Help needed moving from lying on your back to sitting on the side of a flat bed without using bedrails?: A Little Help needed moving to and from a bed to a chair (including a wheelchair)?: A Lot  Help needed standing up from a chair using your arms (e.g., wheelchair or bedside chair)?: A Lot Help needed to walk in hospital room?: Total Help needed climbing 3-5 steps with a railing? : Total 6 Click Score: 12    End of Session   Activity Tolerance: Patient limited by pain;Patient limited by fatigue Patient left: in bed;with call bell/phone within reach Nurse Communication: Mobility status PT Visit Diagnosis: Other abnormalities of gait and mobility (R26.89);Muscle weakness (generalized) (M62.81)    Time: 5784-6962 PT Time Calculation (min) (ACUTE ONLY): 20 min   Charges:   PT Evaluation $PT Eval Moderate Complexity: 1 Mod           Philomena Doheny PT 10/24/2021  Acute Rehabilitation Services Pager [phone] Office [phone]

## 2021-10-24 NOTE — Assessment & Plan Note (Addendum)
Patient is right below-knee amputee uses prosthesis at baseline  been having difficulty ambulating recently.  Occupational Therapy working with prosthesis.  CT scan showed hemorrhagic metastasis with vasogenic edema.    Physical therapy recommends skilled nursing facility on discharge but patient will be approved for home with home health at this time.  TOC aware of significant limitations of the patient at home.

## 2021-10-24 NOTE — ED Notes (Signed)
Pt placed on 2 LNC due to oxygen level dropping to 80%, will notify provider

## 2021-10-24 NOTE — Assessment & Plan Note (Addendum)
With brain and gastric metastasis.  Radiation treatment as per radiation oncology.

## 2021-10-24 NOTE — H&P (Signed)
History and Physical    Patient: Natalie Contreras:[id number] DOB: 10-Jul-1966 DOA: 10/23/2021 DOS: the patient was seen and examined on 10/24/2021 PCP: Celene Squibb, MD  Patient coming from: Home  Chief Complaint:  Chief Complaint  Patient presents with   Leg Swelling    HPI: Natalie Contreras is a 56 y.o. female with medical history significant of DM2 with neuropathy and PAD, R BKA, dCHF, HTN.  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  Pt started on radiation therapy during stay.  Today pt presented to ED for evaluation of leg swelling.  She was supposed to go to onc appointment for rad therapy today but when she woke up felt like she couldn't walk.  Blames her leg swelling, states her legs are very heavy and hard to move.  Does have h/o R BKA.  In ED: Does have BLE pitting edema.  However more concerned about generalized weakness, possibly from worsening of brain mets as was indeed demonstrated on CT.  Started on decadron and hospitalist asked to admit.  Review of Systems: As mentioned in the history of present illness. All other systems reviewed and are negative. Past Medical History:  Diagnosis Date   Abscess of foot 08/09/2019   WITH ULCER  RIGHT FOOT   Acute diastolic CHF (congestive heart failure) ([place]) 10/28/2016   Carotid stenosis, asymptomatic, bilateral 01/15/2017   Diabetes mellitus without complication (HCC)    Heart murmur    Hypertension    PAD (peripheral artery disease) ([place]) 04/10/2017   Left ABI 0.74. Right ABI 0.96.   Past Surgical History:  Procedure Laterality Date   ABDOMINAL AORTOGRAM W/LOWER EXTREMITY N/A 08/10/2019   Procedure: ABDOMINAL AORTOGRAM W/LOWER EXTREMITY;  Surgeon: Waynetta Sandy, MD;  Location: [place] [facility];  Service: Cardiovascular;  Laterality: N/A;   AMPUTATION Right 09/03/2019   Procedure: RIGHT BELOW KNEE AMPUTATION;  Surgeon: Newt Minion, MD;  Location: [facility];  Service: Orthopedics;  Laterality: Right;   AMPUTATION TOE Left 05/05/2017   Procedure: LEFT SECOND TOE AMPUTATION;  Surgeon: Aviva Signs, MD;  Location: [facility];  Service: General;  Laterality: Left;   BIOPSY  10/16/2021   Procedure: BIOPSY;  Surgeon: Sharyn Creamer, MD;  [facility];  Service: Gastroenterology;;   CATARACT EXTRACTION W/PHACO Left 02/08/2016   Procedure: CATARACT EXTRACTION PHACO AND INTRAOCULAR LENS PLACEMENT ([place]);  Surgeon: Tonny Branch, MD;  Location: [facility];  Service: Ophthalmology;  Laterality: Left;  CDE 11.43    CATARACT EXTRACTION W/PHACO Right 02/26/2016   Procedure: CATARACT EXTRACTION PHACO AND INTRAOCULAR LENS PLACEMENT RIGHT; CDE:  16.90;  Surgeon: Tonny Branch, MD;  Location: [facility];  Service: Ophthalmology;  Laterality: Right;   CHOLECYSTECTOMY     ESOPHAGOGASTRODUODENOSCOPY (EGD) WITH PROPOFOL N/A 10/16/2021   Procedure: ESOPHAGOGASTRODUODENOSCOPY (EGD) WITH PROPOFOL;  Surgeon: Sharyn Creamer, MD;  Location: [place];  Service: Gastroenterology;  Laterality: N/A;   FOREIGN BODY REMOVAL Left 04/09/2017   Procedure: FOREIGN BODY REMOVAL ADULT FOOT;  Surgeon: Aviva Signs, MD;  Location: [facility];  Service: General;  Laterality: Left;   I & D EXTREMITY Right 08/11/2019   Procedure: IRRIGATION AND DEBRIDEMENT EXTREMITY;  Surgeon: Newt Minion, MD;  Location: [place];  Service: Orthopedics;  Laterality: Right;   PERIPHERAL VASCULAR ATHERECTOMY Right 08/10/2019   Procedure: PERIPHERAL VASCULAR ATHERECTOMY;  Surgeon: Waynetta Sandy, MD;  Location: [place] [facility];  Service: Cardiovascular;  Laterality: Right;  superficial femoral   PERIPHERAL VASCULAR BALLOON ANGIOPLASTY Right 08/10/2019   Procedure: PERIPHERAL VASCULAR BALLOON ANGIOPLASTY;  Surgeon: Waynetta Sandy, MD;  Location: [place] [facility];  Service: Cardiovascular;  Laterality: Right;  anterior tibial   TRANSMETATARSAL AMPUTATION Left 07/21/2017    Procedure: TRANSMETATARSAL AMPUTATION LEFT FOOT;  Surgeon: Aviva Signs, MD;  Location: [facility];  Service: General;  Laterality: Left;   Social History:  reports that she has been smoking cigarettes. She started smoking about 37 years ago. She has a 12.50 pack-year smoking history. She has never used smokeless tobacco. She reports that she does not drink alcohol and does not use drugs.  No Known Allergies  Family History  Problem Relation Age of Onset   Lung cancer Mother    Lung cancer Father    AAA (abdominal aortic aneurysm) Brother     Prior to Admission medications   Medication Sig Start Date End Date Taking? Authorizing Provider  acetaminophen (TYLENOL) 325 MG tablet Take 1-2 tablets (325-650 mg total) by mouth every 4 (four) hours as needed for mild pain. 09/13/19  Yes Love, Ivan Anchors, PA-C  carvedilol (COREG) 3.125 MG tablet TAKE (1) TABLET BY MOUTH TWICE DAILY WITH A MEAL. Patient taking differently: Take 3.125 mg by mouth 2 (two) times daily with a meal. 10/20/19  Yes Patel, Domenick Bookbinder, MD  Continuous Blood Gluc Sensor (DEXCOM G6 SENSOR) MISC Inject 1 Device into the skin See admin instructions. Place 1 device/sensor into the skin every 10 days, after removing the former one 10/03/21  Yes [provider]  docusate sodium (COLACE) 100 MG capsule Take 1 capsule (100 mg total) by mouth 2 (two) times daily. Patient taking differently: Take 100 mg by mouth 2 (two) times daily as needed for mild constipation. 09/16/19  Yes Love, Ivan Anchors, PA-C  ergocalciferol (VITAMIN D2) 1.25 MG (50000 UT) capsule Take 50,000 Units by mouth every Sunday.   Yes [provider]  insulin aspart (NOVOLOG) 100 UNIT/ML injection Inject 100 Units into the skin See admin instructions. Up to 100 units per day in omni pod. Omnipod to be filled with Novolog to be accurately injected by Omnipod based on settings.   Yes [provider]  Insulin Disposable Pump (OMNIPOD DASH PODS, GEN 4,) MISC  Inject into the skin continuous.   Yes [provider]  JARDIANCE 25 MG TABS tablet Take 25 mg by mouth daily. 10/03/21  Yes [provider]  Multiple Vitamin (MULTIVITAMIN WITH MINERALS) TABS tablet Take 1 tablet by mouth daily. 09/14/19  Yes Love, Ivan Anchors, PA-C  pantoprazole (PROTONIX) 40 MG tablet Take 1 tablet (40 mg total) by mouth 2 (two) times daily. 10/18/21 11/17/21 Yes Nolberto Hanlon, MD  polyethylene glycol (MIRALAX / GLYCOLAX) 17 g packet Take 17 g by mouth 2 (two) times daily. Patient taking differently: Take 17 g by mouth 2 (two) times daily as needed. 09/16/19  Yes Love, Ivan Anchors, PA-C  simvastatin (ZOCOR) 40 MG tablet Take 40 mg by mouth at bedtime.    Yes [provider]  sucralfate (CARAFATE) 1 GM/10ML suspension Take 10 mLs (1 g total) by mouth 4 (four) times daily -  with meals and at bedtime. 10/18/21 11/17/21 Yes Nolberto Hanlon, MD    Physical Exam: Vitals:   10/24/21 0320 10/24/21 0330 10/24/21 0400 10/24/21 0430  BP:  (!) 180/66 (!) 166/64 (!) 190/77  Pulse: 79 73 77 81  Resp:   14   Temp:  SpO2: 99% 99% 95% 99%   Constitutional: NAD, calm, comfortable Eyes: PERRL, lids and conjunctivae normal ENMT: Mucous membranes are moist. Posterior pharynx clear of any exudate or lesions.Normal dentition.  Neck: normal, supple, no masses, no thyromegaly Respiratory: clear to auscultation bilaterally, no wheezing, no crackles. Normal respiratory effort. No accessory muscle use.  Cardiovascular: Regular rate and rhythm, no murmurs / rubs / gallops. Pitting edema in BLE. 2+ pedal pulses. No carotid bruits.  Abdomen: no tenderness, no masses palpated. No hepatosplenomegaly. Bowel sounds positive.  Musculoskeletal: RLE s/p amputation Skin: no rashes, lesions, ulcers. No induration Neurologic: Grossly non-focal, gait not tested Psychiatric: Normal judgment and insight. Alert and oriented x 3. Normal mood.    Data Reviewed:  CT head showing interval worsening  (possibly due to hemorrhage) of mets and vasogenic edema. HGB 8.6 up from 7.7 on discharge Creat 1.3 slightly improved from 1.6 on DC  Assessment and Plan: * Metastatic cancer to brain [facility])- (present on admission) Worsened / increased appearance of lesions on CT possibly due to increased hemorrhage / vasogenic edema. Decadron started in ED, will continue. Suspect this is primary reason she has difficulty with ambulation. PT/OT Sending message to radonc and onc.  Renal cell carcinoma ([place])- (present on admission) With mets to brain, stomach. Currently undergoing rad therapy, scheduled to start chemo soon. Messages sent to radonc and hemeonc.  Call them to touch base in AM.  Acute on chronic diastolic CHF (congestive heart failure) ([place])- (present on admission) Pt with increased peripheral edema from baseline. Putting on lasix 22m IV daily Daily BMP with lasix. No SOB, pulm edema, etc.  DM2 (diabetes mellitus, type 2) (HCC) Cont Jardiance Mod scale SSI Levemir ended up getting DCd after hypoglycemia during last admit. Didn't end up requiring a whole lot of insulin from the [place] either. A1C was 5.4 last admit Control of DM much improved from previously, unfortunately I suspect this is to severe decrease in PO intake / wt loss from metastatic RCC.  Acute blood loss anemia- (present on admission) Due to GIB just this past week from fungating ulcer in stomach. Biopsy of ulcer has confirmed that it is also metastatic RCC HGB improved to 8.6 on admission from 7.7 on discharge. Continue to monitor for signs of bleeding. SCDs only for DVT ppx (due to this and hemorrhagic brain mets). Continue CBC daily.  Benign essential HTN- (present on admission) Cont coreg Add PRN hydralazine       Advance Care Planning:   Code Status: Full Code  Consults: Call Onc and rad-onc in AM  Family Communication: No family in room  Severity of Illness: The appropriate patient  status for this patient is INPATIENT. Inpatient status is judged to be reasonable and necessary in order to provide the required intensity of service to ensure the patient's safety. The patient's presenting symptoms, physical exam findings, and initial radiographic and laboratory data in the context of their chronic comorbidities is felt to place them at high risk for further clinical deterioration. Furthermore, it is not anticipated that the patient will be medically stable for discharge from the hospital within 2 midnights of admission.   * I certify that at the point of admission it is my clinical judgment that the patient will require inpatient hospital care spanning beyond 2 midnights from the point of admission due to high intensity of service, high risk for further deterioration and high frequency of surveillance required.*  Author: GEtta Quill, DO 10/24/2021 5:58 AM  For  on call review [email or web address].

## 2021-10-24 NOTE — Assessment & Plan Note (Addendum)
Hemoglobin was 7.7 on last discharge.  Latest hemoglobin at 8.4..  Patient does have history of GI bleed from fungating ulcer in the stomach which was confirmed to be metastatic renal cell carcinoma.  Continue PPI, sucralfate while on dexamethasone.  Hemoglobin has however remained stable during hospitalization.

## 2021-10-24 NOTE — Assessment & Plan Note (Addendum)
CT head scan showed worsened / increased appearance of lesions on CT possibly due to increased hemorrhage / vasogenic edema.  Patient received IV Decadron during hospitalization with PPI and sucralfate.  Plan is to transition to oral Decadron with PPI sucralfate on discharge.

## 2021-10-24 NOTE — Assessment & Plan Note (Addendum)
Patient's dose of Coreg has been increased to 6.25 twice daily.  Oral hydralazine has been initiated in the hospital as well.  Might need better adjustment as outpatient.

## 2021-10-24 NOTE — ED Notes (Signed)
Pt wiped down with CHG wipes, jewelry removed, belongings placed in bag with name tag. Pt is ready to go to OR

## 2021-10-24 NOTE — ED Notes (Signed)
Patient transported to procedure. 

## 2021-10-25 ENCOUNTER — Inpatient Hospital Stay (HOSPITAL_COMMUNITY): Payer: Medicaid Other

## 2021-10-25 DIAGNOSIS — I10 Essential (primary) hypertension: Secondary | ICD-10-CM | POA: Diagnosis not present

## 2021-10-25 DIAGNOSIS — D62 Acute posthemorrhagic anemia: Secondary | ICD-10-CM | POA: Diagnosis not present

## 2021-10-25 DIAGNOSIS — I5031 Acute diastolic (congestive) heart failure: Secondary | ICD-10-CM

## 2021-10-25 DIAGNOSIS — R262 Difficulty in walking, not elsewhere classified: Secondary | ICD-10-CM | POA: Diagnosis not present

## 2021-10-25 DIAGNOSIS — I5033 Acute on chronic diastolic (congestive) heart failure: Secondary | ICD-10-CM | POA: Diagnosis not present

## 2021-10-25 LAB — GLUCOSE, CAPILLARY
Glucose-Capillary: 290 mg/dL — ABNORMAL HIGH (ref 70–99)
Glucose-Capillary: 292 mg/dL — ABNORMAL HIGH (ref 70–99)
Glucose-Capillary: 226 mg/dL — ABNORMAL HIGH (ref 70–99)
Glucose-Capillary: 192 mg/dL — ABNORMAL HIGH (ref 70–99)

## 2021-10-25 LAB — BASIC METABOLIC PANEL
Anion gap: 11 (ref 5–15)
BUN: 36 mg/dL — ABNORMAL HIGH (ref 6–20)
CO2: 20 mmol/L — ABNORMAL LOW (ref 22–32)
Calcium: 8.4 mg/dL — ABNORMAL LOW (ref 8.9–10.3)
Chloride: 103 mmol/L (ref 98–111)
Creatinine, Ser: 1.34 mg/dL — ABNORMAL HIGH (ref 0.44–1.00)
GFR, Estimated: 47 mL/min — ABNORMAL LOW (ref >60)
Glucose, Bld: 297 mg/dL — ABNORMAL HIGH (ref 70–99)
Potassium: 3.9 mmol/L (ref 3.5–5.1)
Sodium: 134 mmol/L — ABNORMAL LOW (ref 135–145)

## 2021-10-25 LAB — ECHOCARDIOGRAM COMPLETE
AR max vel: 1.51 cm2
AV Area VTI: 1.5 cm2
AV Area mean vel: 1.47 cm2
AV Mean grad: 9.5 mmHg
AV Peak grad: 16.6 mmHg
Ao pk vel: 2.04 m/s
Area-P 1/2: 3.42 cm2
MV VTI: 1.96 cm2
S' Lateral: 3.5 cm
Weight: 3291.03 oz

## 2021-10-25 LAB — CBC
HCT: 26.4 % — ABNORMAL LOW (ref 36.0–46.0)
Hemoglobin: 7.7 g/dL — ABNORMAL LOW (ref 12.0–15.0)
MCH: 23.8 pg — ABNORMAL LOW (ref 26.0–34.0)
MCHC: 29.2 g/dL — ABNORMAL LOW (ref 30.0–36.0)
MCV: 81.7 fL (ref 80.0–100.0)
Platelets: 344 10*3/uL (ref 150–400)
RBC: 3.23 MIL/uL — ABNORMAL LOW (ref 3.87–5.11)
RDW: 20.2 % — ABNORMAL HIGH (ref 11.5–15.5)
WBC: 6.8 10*3/uL (ref 4.0–10.5)
nRBC: 0 % (ref 0.0–0.2)

## 2021-10-25 LAB — MAGNESIUM: Magnesium: 2 mg/dL (ref 1.7–2.4)

## 2021-10-25 MED ORDER — OXYCODONE-ACETAMINOPHEN 5-325 MG PO TABS
1.0000 | ORAL_TABLET | Freq: Four times a day (QID) | ORAL | Status: DC | PRN
  Administered 2021-10-25: 1 via ORAL
  Filled 2021-10-25: qty 1

## 2021-10-25 MED ORDER — INSULIN ASPART 100 UNIT/ML IJ SOLN
0.0000 [IU] | Freq: Three times a day (TID) | INTRAMUSCULAR | Status: DC
  Administered 2021-10-25: 3 [IU] via SUBCUTANEOUS

## 2021-10-25 MED ORDER — CARVEDILOL 6.25 MG PO TABS
6.2500 mg | ORAL_TABLET | Freq: Two times a day (BID) | ORAL | Status: DC
  Administered 2021-10-25 – 2021-10-30 (×12): 6.25 mg via ORAL
  Filled 2021-10-25 (×12): qty 1

## 2021-10-25 MED ORDER — INSULIN ASPART 100 UNIT/ML IJ SOLN
0.0000 [IU] | Freq: Every day | INTRAMUSCULAR | Status: DC
  Administered 2021-10-26: 3 [IU] via SUBCUTANEOUS
  Administered 2021-10-27: 2 [IU] via SUBCUTANEOUS
  Administered 2021-10-28 – 2021-10-29 (×2): 3 [IU] via SUBCUTANEOUS

## 2021-10-25 MED ORDER — INSULIN ASPART 100 UNIT/ML IJ SOLN
3.0000 [IU] | Freq: Three times a day (TID) | INTRAMUSCULAR | Status: DC
  Administered 2021-10-25 – 2021-10-26 (×4): 3 [IU] via SUBCUTANEOUS

## 2021-10-25 MED ORDER — MORPHINE SULFATE (PF) 2 MG/ML IV SOLN
1.0000 mg | Freq: Once | INTRAVENOUS | Status: AC | PRN
  Administered 2021-10-25: 1 mg via INTRAVENOUS
  Filled 2021-10-25: qty 1

## 2021-10-25 NOTE — TOC Initial Note (Signed)
Transition of Care [facility]) - Initial/Assessment Note    Patient Details  Name: Natalie Contreras MRN: [id number] Date of Birth: [birth year]  Transition of Care [facility]) CM/SW Contact:    Dessa Phi, RN Phone Number: 10/25/2021, 2:28 PM  Clinical Narrative: Patient in agreement to ST SNF-faxed out await bed offers.patient has own transportation set up for xrt once @ SNF.            Expected Discharge Plan: Skilled Nursing Facility Barriers to Discharge: Continued Medical Work up   Patient Goals and CMS Choice Patient states their goals for this hospitalization and ongoing recovery are:: go to rehab CMS Medicare.gov Compare Post Acute Care list provided to:: Patient Choice offered to / list presented to : Patient  Expected Discharge Plan and Services Expected Discharge Plan: [place]   Discharge Planning Services: CM Consult Post Acute Care Choice: [place] Living arrangements for the past 2 months: Apartment                                      Prior Living Arrangements/Services Living arrangements for the past 2 months: Apartment Lives with:: Self Patient language and need for interpreter reviewed:: Yes Do you feel safe going back to the place where you live?: Yes      Need for Family Participation in Patient Care: No (Comment) Care giver support system in place?: Yes (comment) Current home services: DME (rw;w/c;R leg prosthetic) Criminal Activity/Legal Involvement Pertinent to Current Situation/Hospitalization: No - Comment as needed  Activities of Daily Living Home Assistive Devices/Equipment: Wheelchair, Environmental consultant (specify type) ADL Screening (condition at time of admission) Patient's cognitive ability adequate to safely complete daily activities?: Yes Is the patient deaf or have difficulty hearing?: No Does the patient have difficulty seeing, even when wearing glasses/contacts?: No Does the patient have difficulty concentrating,  remembering, or making decisions?: No Patient able to express need for assistance with ADLs?: Yes Does the patient have difficulty dressing or bathing?: Yes Independently performs ADLs?: No Communication: Independent Dressing (OT): Independent Grooming: Independent Feeding: Independent Bathing: Needs assistance Toileting: Needs assistance In/Out Bed: Dependent Walks in Home: Dependent Does the patient have difficulty walking or climbing stairs?: No Weakness of Legs: Left Weakness of Arms/Hands: None  Permission Sought/Granted Permission sought to share information with : Case Manager Permission granted to share information with : Yes, Verbal Permission Granted  Share Information with NAME: Case manager           Emotional Assessment              Admission diagnosis:  Peripheral edema [R60.9] Weakness [R53.1] Metastatic cancer to brain [facility]) [C79.31] Metastatic malignant neoplasm, unspecified site [facility]) [C79.9] Patient Active Problem List   Diagnosis Date Noted   Metastatic cancer to brain ([place]) 10/24/2021   Renal cell carcinoma ([place]) 10/24/2021   Ambulatory dysfunction 10/24/2021   Brain metastases ([place]) 10/17/2021   Melena    Blood in the stool    Symptomatic anemia 10/13/2021   DM2 (diabetes mellitus, type 2) ([place]) 10/13/2021   Metastatic disease ([place]) 10/13/2021   Left renal mass 10/13/2021   Drug induced constipation    Postoperative pain    Morbid obesity ([place])    Acute blood loss anemia    Hypoalbuminemia due to protein-calorie malnutrition (HCC)    Stage 3 chronic kidney disease ([place])    Diabetic peripheral neuropathy ([place])    S/P BKA (  below knee amputation) ([place]) 09/06/2019   Cutaneous abscess of right foot    Dehiscence of amputation stump (HCC)    Status post skin graft 08/20/2019   Severe protein-calorie malnutrition ([place])    Gangrene of right foot ([place])    Abscess or cellulitis of foot 08/08/2019   Obesity (BMI 30-39.9) 08/08/2019   Wound  infection 06/13/2019   Cellulitis 07/17/2017   Subacute osteomyelitis, right ankle and foot ([place])    Toe amputation status, left 05/05/2017   Gangrene of left foot ([place])    PAD (peripheral artery disease) ([place]) 04/10/2017   Essential hypertension 04/10/2017   Acute on chronic diastolic CHF (congestive heart failure) ([place]) 04/10/2017   AKI (acute kidney injury) ([place]) 04/10/2017   Foreign body in left foot    Cellulitis and abscess of foot 04/07/2017   Carotid stenosis, asymptomatic, bilateral 01/15/2017   Visual disturbance 01/15/2017   Tobacco abuse 01/15/2017   Acute respiratory failure with hypoxia ([place]) 10/30/2016   Pulmonary hypertension ([place]) 46/96/2952   Acute diastolic CHF (congestive heart failure) ([place]) 10/28/2016   Benign essential HTN 10/28/2016   Hyperlipidemia 10/28/2016   Diabetic infection of right foot ([place]) 10/28/2016   PCP:  Celene Squibb, MD Pharmacy:   [facility] [place] [address] [place] [address] Phone: [phone] Fax: [phone]     Social Determinants of Health (SDOH) Interventions    Readmission Risk Interventions Readmission Risk Prevention Plan 10/18/2021 10/17/2021  Transportation Screening - Complete  PCP or Specialist Appt within 3-5 Days - Complete  HRI or Home Care Consult Complete -  Social Work Consult for [place] Planning/Counseling - Complete  Palliative Care Screening - Not Applicable  Medication Review Press photographer) - Complete  Some recent data might be hidden

## 2021-10-25 NOTE — Progress Notes (Signed)
Inpatient Diabetes Program Recommendations  AACE/ADA: New Consensus Statement on Inpatient Glycemic Control (2015)  Target Ranges:  Prepandial:   less than 140 mg/dL      Peak postprandial:   less than 180 mg/dL (1-2 hours)      Critically ill patients:  140 - 180 mg/dL   Lab Results  Component Value Date   GLUCAP 292 (H) 10/25/2021   HGBA1C 5.4 10/13/2021    Review of Glycemic Control  Diabetes history: DM2 Outpatient Diabetes medications: Jardiance 25 mg QD, previously had OmniPod (not wearing now) Current orders for Inpatient glycemic control: Levemir 10 QHS, Novolog 0-15 units TID with meals and 0-5 HS + 3 units TID, Jardiance25 mg QD On Decadron 4 mg Q6H  HgbA1C - 5.4%  Inpatient Diabetes Program Recommendations:    Increase Levemir to 12 units QHS  Continue to follow.  Thank you. Lorenda Peck, RD, LDN, CDE Inpatient Diabetes Coordinator [phone]

## 2021-10-25 NOTE — Progress Notes (Addendum)
PROGRESS NOTE    Natalie Contreras  IOX:[id number] DOB: 1966-03-18 DOA: 10/23/2021 PCP: Celene Squibb, MD    Brief Narrative:  Natalie Contreras is a 56 y.o. 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  She was supposed to go for radiation oncology appointment but could not go so she decided to come to the hospital.  In the ED patient had a CT scan done which showed multiple intracranial metastasis some hemorrhagic and some with associated vasogenic edema which was progressive in nature.  Patient was then considered for admission to hospital for further evaluation treatment and assessment.  During hospitalization, patient was seen by radiation oncology and underwent simulation for radiation treatment on 10/24/2021.    Assessment and Plan: * Ambulatory dysfunction- (present on admission) Patient is right below-knee amputee uses prosthesis at baseline but has been having difficulty ambulating recently.  CT scan shows hemorrhagic metastasis with vasogenic edema.    Physical therapy recommends skilled nursing facility on discharge.  Metastatic cancer to brain [facility])- (present on admission) CT head scan showed worsened / increased appearance of lesions on CT possibly due to increased hemorrhage / vasogenic edema.  Patient has been started on IV Decadron.  Plan for radiation to be initiated soon.  Radiation oncology on board.  Renal cell carcinoma ([place])- (present on admission) With brain and gastric metastasis.  Radiation treatment as per radiation oncology.  Hematology and radiation oncology has been notified.  DM2 (diabetes mellitus, type 2) (HCC) Mild  acidosis noted.  Hold Jardiance.  Continue sliding scale insulin, latest hemoglobin A1c was 5.4.  Patient was on Levemir in the past but was discontinued due to hypoglycemia.  Currently on Decadron so low-dose Levemir was initiated yesterday.  We will increase the dose of Levemir and add mealtime insulin as well.  Acute blood loss anemia- (present on admission) Hemoglobin was 7.7 on last discharge.  Hemoglobin today at 7.7  Patient does have history of GI bleed from fungating ulcer in the stomach which was confirmed to be metastatic renal cell carcinoma.  Continue to monitor CBC daily.  Acute on chronic diastolic CHF (congestive heart failure) ([place])- (present on admission) Patient with increased peripheral edema from baseline.  Continue with IV diuretics.  On IV 40 Lasix daily.  No overt shortness of breath.  Continue to monitor renal function.  Creatinine at 1.3 from 1.3.  No recent echocardiogram in the computer.  We will get ECHO  Benign essential HTN- (present on admission) Continue Coreg, as needed hydralazine.  Will increase dose of Coreg to 6.25 twice daily at this time.  Might need a second agent    DVT prophylaxis: SCDs Start: 10/24/21 0422   Code Status:     Code Status: Full Code  Disposition: Skilled nursing facility as per PT evaluation.  Status is: Inpatient  Remains inpatient appropriate because: Ambulatory dysfunction, need for placement, ongoing radiation treatment   Family Communication:  I spoke with the patient's daughter on 10/24/21  Consultants:  Radiation oncology Hematooncology   Procedures:  Radiation treatment  Antimicrobials:  None  Anti-infectives (From admission, onward)    None       Subjective: Today, patient was seen and examined bedside.  Has been able to tolerate  diet.  Denies any abdominal pain, nausea, vomiting or shortness of breath.  Had a radiation simulation done yesterday.  Objective: Vitals:   10/24/21 2045 10/25/21 0000  10/25/21 0530 10/25/21 0623  BP: (!) 134/45 (!) 156/53 (!) 169/69 (!) 169/69  Pulse: 81 76  81  Resp: 20 20  20   Temp: 98.4 F (36.9 C) 97.7 F (36.5 C)  98 F (36.7 C)  TempSrc: Oral Oral  Oral  SpO2: 94% 96%  96%  Weight:    93.3 kg   No intake or output data in the 24 hours ending 10/25/21 1132 Filed Weights   10/25/21 0623  Weight: 93.3 kg    Physical Examination:  General: Obese built, not in obvious distress HENT:   No scleral pallor or icterus noted. Oral mucosa is moist.  Chest:  Clear breath sounds.  Diminished breath sounds bilaterally. No crackles or wheezes.  CVS: S1 &S2 heard. No murmur.  Regular rate and rhythm. Abdomen: Soft, nontender, nondistended.  Bowel sounds are heard.   Extremities: No cyanosis, clubbing with right below-knee amputation and left lower extremity edema.   Peripheral pulses are palpable. Psych: Alert, awake and oriented, normal mood CNS:  No cranial nerve deficits.  Moving extremities. Skin: Warm and dry.  No rashes noted.  Data Reviewed:   CBC: Recent Labs  Lab 10/23/21 1320 10/24/21 0431 10/25/21 0422  WBC 5.7 5.7 6.8  NEUTROABS 4.0  --   --   HGB 8.6* 7.9* 7.7*  HCT 30.1* 27.2* 26.4*  MCV 85.3 82.7 81.7  PLT 394 387 678    Basic Metabolic Panel: Recent Labs  Lab 10/23/21 1320 10/24/21 0431 10/25/21 0422  NA 139 136 134*  K 3.1* 3.4* 3.9  CL 107 106 103  CO2 22 21* 20*  GLUCOSE 158* 219* 297*  BUN 34* 31* 36*  CREATININE 1.19* 1.34* 1.34*  CALCIUM 8.9 8.6* 8.4*  MG  --   --  2.0    Liver Function Tests: Recent Labs  Lab 10/23/21 1320  AST 13*  ALT 14  ALKPHOS 140*  BILITOT 0.5  PROT 7.1  ALBUMIN 3.1*     Radiology Studies: CT Head Wo Contrast  Result Date: 10/24/2021 CLINICAL DATA:  Brain/CNS neoplasm to assess treatment response. EXAM: CT HEAD WITHOUT CONTRAST TECHNIQUE: Contiguous axial images were obtained from the base of the skull through the vertex without intravenous contrast. RADIATION DOSE  REDUCTION: This exam was performed according to the departmental dose-optimization program which includes automated exposure control, adjustment of the mA and/or kV according to patient size and/or use of iterative reconstruction technique. COMPARISON:  MRI brain 10/19/2021.  CT head 10/13/2021 FINDINGS: Brain: Multiple intracranial metastases are again demonstrated. Largest is a heterogeneous mixed density but mostly hyperdense lesion in the left parietal lobe measuring 3 x 3.7 cm diameter. Size measures larger than on previous CT. There is surrounding vasogenic edema as before. Multiple additional mostly hyperdense lesions are also demonstrated. Lesions are more numerous and larger than on the previous CT but were demonstrated on the prior MRI. Hyperdensity in the lesions suggest hemorrhagic component. No mass effect or midline shift. No abnormal extra-axial fluid collections. Ventricles are not dilated. Vascular: Intracranial arterial vascular calcifications are present. Skull: Normal. Negative for fracture or focal lesion. Sinuses/Orbits: Paranasal sinuses and mastoid air cells are clear. Other: None. IMPRESSION: Multiple intracranial metastases, some hemorrhagic, and associated vasogenic white matter edema demonstrating progression since prior CT, possibly due to intervening hemorrhage within the lesions. Size and distribution  of the lesions is similar to the interval MRI. Greater than 10 lesions are present. No acute mass effect. Electronically Signed   By: Lucienne Capers M.D.   On: 10/24/2021 00:32   VAS [place] LOWER EXTREMITY VENOUS (DVT) (7a-7p)  Result Date: 10/24/2021  Lower Venous DVT Study Patient Name:  SIGNA CHEEK  Date of Exam:   10/23/2021 Medical Rec #: [id number]        Accession #:    [id number] Date of Birth: 11-17-1965       Patient Gender: F Patient Age:   9 years Exam Location:  [facility] Procedure:      VAS [place] LOWER EXTREMITY VENOUS (DVT) Referring Phys: Wille Glaser KNAPP  --------------------------------------------------------------------------------  Indications: Swelling of left lower extremity.  Limitations: Poor ultrasound/tissue interface. Comparison Study: 07-17-2017 Prior bilateral lower extremity venous was negative                   for DVT. Performing Technologist: Darlin Coco RDMS, RVT  Examination Guidelines: A complete evaluation includes B-mode imaging, spectral Doppler, color Doppler, and power Doppler as needed of all accessible portions of each vessel. Bilateral testing is considered an integral part of a complete examination. Limited examinations for reoccurring indications may be performed as noted. The reflux portion of the exam is performed with the patient in reverse Trendelenburg.  +-----+---------------+---------+-----------+----------+--------------+  RIGHT Compressibility Phasicity Spontaneity Properties Thrombus Aging  +-----+---------------+---------+-----------+----------+--------------+  CFV   Full            Yes       Yes                                    +-----+---------------+---------+-----------+----------+--------------+   +---------+---------------+---------+-----------+----------+-------------------+  LEFT      Compressibility Phasicity Spontaneity Properties Thrombus Aging       +---------+---------------+---------+-----------+----------+-------------------+  CFV       Full            Yes       Yes                                         +---------+---------------+---------+-----------+----------+-------------------+  SFJ       Full                                                                  +---------+---------------+---------+-----------+----------+-------------------+  FV Prox   Full                                                                  +---------+---------------+---------+-----------+----------+-------------------+  FV Mid    Full                                                                   +---------+---------------+---------+-----------+----------+-------------------+  FV Distal Full                                                                  +---------+---------------+---------+-----------+----------+-------------------+  PFV       Full                                                                  +---------+---------------+---------+-----------+----------+-------------------+  POP       Full            Yes       Yes                                         +---------+---------------+---------+-----------+----------+-------------------+  PTV                                                        Not well visualized  +---------+---------------+---------+-----------+----------+-------------------+  PERO                                                       Not well visualized  +---------+---------------+---------+-----------+----------+-------------------+    Summary: RIGHT: - No evidence of common femoral vein obstruction.  LEFT: - There is no evidence of deep vein thrombosis in the lower extremity. However, portions of this examination were limited- see technologist comments above.  - No cystic structure found in the popliteal fossa.  *See table(s) above for measurements and observations. Electronically signed by Harold Barban MD on 10/24/2021 at 8:53:54 PM.    Final       LOS: 1 day    Flora Lipps, MD [facility] 10/25/2021, 11:32 AM

## 2021-10-25 NOTE — NC FL2 (Signed)
[place] LEVEL OF CARE SCREENING TOOL     IDENTIFICATION  Patient Name: Natalie Contreras Birthdate: 10-06-1965 Sex: female Admission Date (Current Location): 10/23/2021  [facility] and [place] Number:  Herbalist and Address:  [facility],  [place] [address], [place]      Provider Number: [id number]  Attending Physician Name and Address:  Flora Lipps, MD  Relative Name and Phone Number:  Natalie Contreras [phone]    Current Level of Care: Hospital Recommended Level of Care: [place] Prior Approval Number:    Date Approved/Denied:   PASRR Number: [id number]  Discharge Plan: SNF    Current Diagnoses: Patient Active Problem List   Diagnosis Date Noted   Metastatic cancer to brain ([place]) 10/24/2021   Renal cell carcinoma ([place]) 10/24/2021   Ambulatory dysfunction 10/24/2021   Brain metastases ([place]) 10/17/2021   Melena    Blood in the stool    Symptomatic anemia 10/13/2021   DM2 (diabetes mellitus, type 2) ([place]) 10/13/2021   Metastatic disease ([place]) 10/13/2021   Left renal mass 10/13/2021   Drug induced constipation    Postoperative pain    Morbid obesity ([place])    Acute blood loss anemia    Hypoalbuminemia due to protein-calorie malnutrition (HCC)    Stage 3 chronic kidney disease (HCC)    Diabetic peripheral neuropathy (HCC)    S/P BKA (below knee amputation) ([place]) 09/06/2019   Cutaneous abscess of right foot    Dehiscence of amputation stump (HCC)    Status post skin graft 08/20/2019   Severe protein-calorie malnutrition (HCC)    Gangrene of right foot (HCC)    Abscess or cellulitis of foot 08/08/2019   Obesity (BMI 30-39.9) 08/08/2019   Wound infection 06/13/2019   Cellulitis 07/17/2017   Subacute osteomyelitis, right ankle and foot ([place])    Toe amputation status, left 05/05/2017   Gangrene of left foot (HCC)    PAD (peripheral artery disease) ([place]) 04/10/2017   Essential hypertension 04/10/2017    Acute on chronic diastolic CHF (congestive heart failure) ([place]) 04/10/2017   AKI (acute kidney injury) ([place]) 04/10/2017   Foreign body in left foot    Cellulitis and abscess of foot 04/07/2017   Carotid stenosis, asymptomatic, bilateral 01/15/2017   Visual disturbance 01/15/2017   Tobacco abuse 01/15/2017   Acute respiratory failure with hypoxia (HCC) 10/30/2016   Pulmonary hypertension ([place]) 50/93/2671   Acute diastolic CHF (congestive heart failure) ([place]) 10/28/2016   Benign essential HTN 10/28/2016   Hyperlipidemia 10/28/2016   Diabetic infection of right foot ([place]) 10/28/2016    Orientation RESPIRATION BLADDER Height & Weight     Self, Time, Situation, Place  Normal   Weight: 93.3 kg Height:     BEHAVIORAL SYMPTOMS/MOOD NEUROLOGICAL BOWEL NUTRITION STATUS      Continent Diet (CHO MOD)  AMBULATORY STATUS COMMUNICATION OF NEEDS Skin   Limited Assist Verbally Normal (R BKA)                       Personal Care Assistance Level of Assistance  Bathing, Feeding, Dressing   Feeding assistance: Limited assistance Dressing Assistance: Limited assistance     Functional Limitations Info  Sight, Hearing, Speech Sight Info: Adequate Hearing Info: Adequate Speech Info: Adequate    SPECIAL CARE FACTORS FREQUENCY  PT (By licensed PT), OT (By licensed OT)     PT Frequency:  (5x week) OT Frequency:  (5x week)  Contractures Contractures Info: Not present    Additional Factors Info  Code Status, Allergies, Insulin Sliding Scale Code Status Info:  (Full) Allergies Info:  (NKA)   Insulin Sliding Scale Info:  (SSI)       Current Medications (10/25/2021):  This is the current hospital active medication list Current Facility-Administered Medications  Medication Dose Route Frequency Provider Last Rate Last Admin   acetaminophen (TYLENOL) tablet 650 mg  650 mg Oral Q6H PRN Etta Quill, DO   650 mg at 10/24/21 2330   Or   acetaminophen (TYLENOL) suppository  650 mg  650 mg Rectal Q6H PRN Etta Quill, DO       carvedilol (COREG) tablet 6.25 mg  6.25 mg Oral BID WC Pokhrel, [place], MD   6.25 mg at 10/25/21 0918   dexamethasone (DECADRON) injection 4 mg  4 mg Intravenous Q6H Jennette Kettle M, DO   4 mg at 10/25/21 1226   docusate sodium (COLACE) capsule 100 mg  100 mg Oral BID PRN Etta Quill, DO       furosemide (LASIX) injection 40 mg  40 mg Intravenous Daily Jennette Kettle M, DO   40 mg at 10/25/21 8250   hydrALAZINE (APRESOLINE) injection 10-20 mg  10-20 mg Intravenous Q4H PRN Etta Quill, DO   20 mg at 10/25/21 0530   insulin aspart (novoLOG) injection 0-15 Units  0-15 Units Subcutaneous TID WC Etta Quill, DO   8 Units at 10/25/21 0916   insulin aspart (novoLOG) injection 0-5 Units  0-5 Units Subcutaneous QHS Pokhrel, Laxman, MD       insulin aspart (novoLOG) injection 3 Units  3 Units Subcutaneous TID WC Pokhrel, Laxman, MD   3 Units at 10/25/21 1228   insulin detemir (LEVEMIR) injection 10 Units  10 Units Subcutaneous QHS Pokhrel, Laxman, MD   10 Units at 10/24/21 2150   multivitamin with minerals tablet 1 tablet  1 tablet Oral Daily Jennette Kettle M, DO   1 tablet at 10/25/21 0918   ondansetron (ZOFRAN) tablet 4 mg  4 mg Oral Q6H PRN Etta Quill, DO       Or   ondansetron [facility]) injection 4 mg  4 mg Intravenous Q6H PRN Etta Quill, DO       oxyCODONE-acetaminophen (PERCOCET/ROXICET) 5-325 MG per tablet 1 tablet  1 tablet Oral Q6H PRN Pokhrel, Laxman, MD   1 tablet at 10/25/21 1226   pantoprazole (PROTONIX) EC tablet 40 mg  40 mg Oral BID Jennette Kettle M, DO   40 mg at 10/25/21 5397   polyethylene glycol (MIRALAX / GLYCOLAX) packet 17 g  17 g Oral BID PRN Etta Quill, DO       potassium chloride SA (KLOR-CON M) CR tablet 40 mEq  40 mEq Oral Daily Jennette Kettle M, DO   40 mEq at 10/25/21 6734   simvastatin (ZOCOR) tablet 40 mg  40 mg Oral QHS Jennette Kettle M, DO   40 mg at 10/24/21 2150   sucralfate  (CARAFATE) 1 GM/10ML suspension 1 g  1 g Oral TID WC & HS Etta Quill, DO   1 g at 10/25/21 1226     Discharge Medications: Please see discharge summary for a list of discharge medications.  Relevant Imaging Results:  Relevant Lab Results:   Additional Information SS#[id number];Moderna x2  Nikia Mangino, Juliann Pulse, RN

## 2021-10-25 NOTE — Progress Notes (Signed)
Echocardiogram 2D Echocardiogram has been performed.  Arlyss Gandy 10/25/2021, 3:41 PM

## 2021-10-26 LAB — BASIC METABOLIC PANEL
Anion gap: 9 (ref 5–15)
BUN: 49 mg/dL — ABNORMAL HIGH (ref 6–20)
CO2: 22 mmol/L (ref 22–32)
Calcium: 8.5 mg/dL — ABNORMAL LOW (ref 8.9–10.3)
Chloride: 107 mmol/L (ref 98–111)
Creatinine, Ser: 1.43 mg/dL — ABNORMAL HIGH (ref 0.44–1.00)
GFR, Estimated: 44 mL/min — ABNORMAL LOW (ref >60)
Glucose, Bld: 254 mg/dL — ABNORMAL HIGH (ref 70–99)
Potassium: 4.5 mmol/L (ref 3.5–5.1)
Sodium: 138 mmol/L (ref 135–145)

## 2021-10-26 LAB — CBC
HCT: 26.3 % — ABNORMAL LOW (ref 36.0–46.0)
Hemoglobin: 7.8 g/dL — ABNORMAL LOW (ref 12.0–15.0)
MCH: 24.4 pg — ABNORMAL LOW (ref 26.0–34.0)
MCHC: 29.7 g/dL — ABNORMAL LOW (ref 30.0–36.0)
MCV: 82.2 fL (ref 80.0–100.0)
Platelets: 319 10*3/uL (ref 150–400)
RBC: 3.2 MIL/uL — ABNORMAL LOW (ref 3.87–5.11)
RDW: 20.3 % — ABNORMAL HIGH (ref 11.5–15.5)
WBC: 7.3 10*3/uL (ref 4.0–10.5)
nRBC: 0 % (ref 0.0–0.2)

## 2021-10-26 LAB — MAGNESIUM: Magnesium: 2.1 mg/dL (ref 1.7–2.4)

## 2021-10-26 LAB — GLUCOSE, CAPILLARY
Glucose-Capillary: 255 mg/dL — ABNORMAL HIGH (ref 70–99)
Glucose-Capillary: 280 mg/dL — ABNORMAL HIGH (ref 70–99)
Glucose-Capillary: 215 mg/dL — ABNORMAL HIGH (ref 70–99)
Glucose-Capillary: 272 mg/dL — ABNORMAL HIGH (ref 70–99)

## 2021-10-26 MED ORDER — HYDRALAZINE HCL 25 MG PO TABS
25.0000 mg | ORAL_TABLET | Freq: Three times a day (TID) | ORAL | Status: DC
  Administered 2021-10-26 – 2021-10-30 (×12): 25 mg via ORAL
  Filled 2021-10-26 (×12): qty 1

## 2021-10-26 MED ORDER — INSULIN DETEMIR 100 UNIT/ML ~~LOC~~ SOLN
15.0000 [IU] | Freq: Every day | SUBCUTANEOUS | Status: DC
  Administered 2021-10-26 – 2021-10-29 (×4): 15 [IU] via SUBCUTANEOUS
  Filled 2021-10-26 (×5): qty 0.15

## 2021-10-26 MED ORDER — INSULIN ASPART 100 UNIT/ML IJ SOLN
5.0000 [IU] | Freq: Three times a day (TID) | INTRAMUSCULAR | Status: DC
  Administered 2021-10-26 – 2021-10-30 (×11): 5 [IU] via SUBCUTANEOUS

## 2021-10-26 NOTE — TOC Progression Note (Signed)
Transition of Care [facility]) - Progression Note    Patient Details  Name: Natalie Contreras MRN: [id number] Date of Birth: 01-05-1966  [facility]) CM/SW Contact  Keena Heesch, Juliann Pulse, RN Phone Number: 10/26/2021, 2:57 PM  Clinical Narrative: No bed offers-patient agree to go home.She has rw/w/c,r leg prosthetic, She will make own transport home transportation by calling today to set up d/c likely Monday after xrt. Will check w/[facility] for HHPT/aide/csw.      Expected Discharge Plan: [place] Barriers to Discharge: Continued Medical Work up  Expected Discharge Plan and Services Expected Discharge Plan: [place]   Discharge Planning Services: CM Consult Post Acute Care Choice: [place] Living arrangements for the past 2 months: Apartment                                       Social Determinants of Health (SDOH) Interventions    Readmission Risk Interventions Readmission Risk Prevention Plan 10/18/2021 10/17/2021  Transportation Screening - Complete  PCP or Specialist Appt within 3-5 Days - Complete  HRI or Home Care Consult Complete -  Social Work Consult for [place] Planning/Counseling - Complete  Palliative Care Screening - Not Applicable  Medication Review Press photographer) - Complete  Some recent data might be hidden

## 2021-10-26 NOTE — Progress Notes (Signed)
Occupational Therapy Treatment Patient Details Name: Natalie Contreras MRN: [id number] DOB: 1965/12/30 Today's Date: 10/26/2021   History of present illness 56 y.o. female with medical history significant of DM2 with neuropathy and PAD, R BKA, dCHF, HTN. 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 Pt started on radiation therapy during stay. Pt presented to ED 10/23/21 for evaluation of leg swelling and difficulty with walking.   OT comments  Treatment focused on grooming task and then working on functional mobility to advance ADLs to [place]. Patient needed increased time to don prosthesis and get it to lock in place. Patient overall min guard to stand and for transfer to chair with RW. Patient reports feeling like she has lost all of her strength and is agreeable to rehab.    Recommendations for follow up therapy are one component of a multi-disciplinary discharge planning process, led by the attending physician.  Recommendations may be updated based on patient status, additional functional criteria and insurance authorization.    Follow Up Recommendations  Skilled nursing-short term rehab (<3 hours/day)    Assistance Recommended at Discharge Frequent or constant Supervision/Assistance  Patient can return home with the following  A little help with bathing/dressing/bathroom;Assistance with cooking/housework;Assist for transportation;A little help with walking and/or transfers   Equipment Recommendations  None recommended by OT    Recommendations for Other Services      Precautions / Restrictions Precautions Precautions: Fall Precaution Comments: R BKA with prostesis Restrictions Weight Bearing Restrictions: No       Mobility Bed Mobility                    Transfers                         Balance Overall balance assessment: Needs assistance Sitting-balance support: No upper  extremity supported Sitting balance-Leahy Scale: Good     Standing balance support: Bilateral upper extremity supported, Reliant on assistive device for balance Standing balance-Leahy Scale: Poor                             ADL either performed or assessed with clinical judgement   ADL Overall ADL's : Needs assistance/impaired     Grooming: Oral care;Set up;Bed level Grooming Details (indicate cue type and reason): performed grooming at bed level                             Functional mobility during ADLs: Min guard;Rolling walker (2 wheels) General ADL Comments: Patietn required increased time to don prosthesis - with two attempts in standing to get it to click in. Patient min guard for standing from slightly elevated bed height with RW - is slow to power up. Min guard to transfer to recliner.    Extremity/Trunk Assessment Upper Extremity Assessment Upper Extremity Assessment: Overall WFL for tasks assessed            Vision Patient Visual Report: No change from baseline     Perception     Praxis      Cognition Arousal/Alertness: Awake/alert Behavior During Therapy: WFL for tasks assessed/performed Overall Cognitive Status: Within Functional Limits for tasks assessed  Exercises      Shoulder Instructions       General Comments      Pertinent Vitals/ Pain       Pain Assessment Pain Assessment: No/denies pain  Home Living                                          Prior Functioning/Environment              Frequency  Min 2X/week        Progress Toward Goals  OT Goals(current goals can now be found in the care plan section)  Progress towards OT goals: Progressing toward goals  Acute Rehab OT Goals Patient Stated Goal: to get stronger OT Goal Formulation: With patient Time For Goal Achievement: 11/07/21 Potential to Achieve Goals: Good  Plan  Discharge plan remains appropriate;Frequency remains appropriate    Co-evaluation                 AM-PAC OT "6 Clicks" Daily Activity     Outcome Measure   Help from another person eating meals?: None Help from another person taking care of personal grooming?: A Little Help from another person toileting, which includes using toliet, bedpan, or urinal?: A Lot Help from another person bathing (including washing, rinsing, drying)?: A Lot Help from another person to put on and taking off regular upper body clothing?: A Little Help from another person to put on and taking off regular lower body clothing?: A Lot 6 Click Score: 16    End of Session Equipment Utilized During Treatment: Rolling walker (2 wheels);Gait belt  OT Visit Diagnosis: Unsteadiness on feet (R26.81);Other abnormalities of gait and mobility (R26.89);Muscle weakness (generalized) (M62.81)   Activity Tolerance Patient tolerated treatment well   Patient Left with call bell/phone within reach;in chair;with chair alarm set   Nurse Communication Mobility status        Time: 2671-2458 OT Time Calculation (min): 27 min  Charges: OT General Charges $OT Visit: 1 Visit OT Treatments $Self Care/Home Management : 8-22 mins $Therapeutic Activity: 8-22 mins  Kavish [place], OTR/L [place]  Office [phone] Pager: [place] 10/26/2021, 12:38 PM

## 2021-10-26 NOTE — Progress Notes (Signed)
PROGRESS NOTE    Natalie Contreras  PPJ:[id number] DOB: Sep 27, 1965 DOA: 10/23/2021 PCP: Celene Squibb, MD    Brief Narrative:  Natalie Contreras is a 56 y.o. 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  She was supposed to go for radiation oncology appointment but could not go so she decided to come to the hospital.  In the ED patient had a CT scan done which showed multiple intracranial metastasis some hemorrhagic and some with associated vasogenic edema which was progressive in nature.  Patient was then considered for admission to hospital for further evaluation treatment and assessment.  During hospitalization, patient was seen by radiation oncology and underwent simulation for radiation treatment on 10/24/2021.  Currently awaiting for skilled nursing facility placement.    Assessment and Plan: * Ambulatory dysfunction- (present on admission) Patient is right below-knee amputee uses prosthesis at baseline but has been having difficulty ambulating recently.  CT scan shows hemorrhagic metastasis with vasogenic edema.    Physical therapy recommends skilled nursing facility on discharge.  Metastatic cancer to brain [facility])- (present on admission) CT head scan showed worsened / increased appearance of lesions on CT possibly due to increased hemorrhage / vasogenic edema.  Patient has been started on IV Decadron.  Plan for radiation to be initiated soon.  Radiation oncology on board.  Renal cell carcinoma ([place])- (present on admission) With brain and gastric metastasis.  Radiation treatment as per radiation oncology.  Hematology and radiation oncology has  been notified.  DM2 (diabetes mellitus, type 2) (HCC) With hyperglycemia.  Mild acidosis noted on admission so Jardiance on hold.  Patient is on insulin pump at home.  Continue Levemir, mealtime and and sliding scale insulin at this time.  Patient is also on Decadron.  Have recorded on board.  Will follow recommendation  Acute blood loss anemia- (present on admission) Hemoglobin was 7.7 on last discharge.  Hemoglobin today at 7.8  Patient does have history of GI bleed from fungating ulcer in the stomach which was confirmed to be metastatic renal cell carcinoma.  Continue to monitor CBC daily.  Acute on chronic diastolic CHF (congestive heart failure) ([place])- (present on admission) Patient with increased peripheral edema from baseline.  Continue with IV diuretics.  On IV 40 Lasix daily.  No overt shortness of breath.  Continue to monitor renal function.  Creatinine at 1.4 from 1.3< 1.3.  2D echocardiogram from 10/25/2021 showed LV ejection fraction of 55 to 60% with grade 3 diastolic dysfunction  Benign essential HTN- (present on admission) Continue Coreg,increased dose of Coreg to 6.25 twice daily since yesterday.  We will add hydralazine for now.    DVT prophylaxis: SCDs Start: 10/24/21 0422   Code Status:     Code Status: Full Code  Disposition: Skilled nursing facility as per PT evaluation.  Status is: Inpatient  Remains inpatient appropriate because: Ambulatory dysfunction, need for placement, ongoing radiation treatment   Family Communication:  I spoke with the patient's daughter on 10/24/21  Consultants:  Radiation oncology Hematooncology   Procedures:  Radiation treatment  Antimicrobials:  None  Anti-infectives (From admission, onward)    None       Subjective: Today, patient was seen and examined at bedside.  Feels mild  pain in the left leg but no nausea vomiting fever chills.    Objective: Vitals:   10/25/21 2010 10/26/21 0451 10/26/21 0606 10/26/21 0933  BP:  (!) 137/43 (!) 170/69 (!) 144/54 (!) 161/61  Pulse: 73 72 72 74  Resp: 20 18    Temp: (!) 97.4 F (36.3 C) 97.6 F (36.4 C)  97.7 F (36.5 C)  TempSrc:  Oral  Oral  SpO2: 95% 95%    Weight:  95.1 kg    Height:  5\' 4"  (8.938 m)      Intake/Output Summary (Last 24 hours) at 10/26/2021 1321 Last data filed at 10/26/2021 1100 Gross per 24 hour  Intake 840 ml  Output 300 ml  Net 540 ml   Filed Weights   10/25/21 0623 10/26/21 0451  Weight: 93.3 kg 95.1 kg    Physical Examination:  General: Obese built, not in obvious distress HENT:   No scleral pallor or icterus noted. Oral mucosa is moist.  Chest:  Clear breath sounds.  Diminished breath sounds bilaterally. No crackles or wheezes.  CVS: S1 &S2 heard. No murmur.  Regular rate and rhythm. Abdomen: Soft, nontender, nondistended.  Bowel sounds are heard.   Extremities:   Right below-knee amputation and left lower extremity edema Psych: Alert, awake and oriented, normal mood CNS:  No cranial nerve deficits. Skin: Warm and dry.  No rashes noted.  Data Reviewed:   CBC: Recent Labs  Lab 10/23/21 1320 10/24/21 0431 10/25/21 0422 10/26/21 0437  WBC 5.7 5.7 6.8 7.3  NEUTROABS 4.0  --   --   --   HGB 8.6* 7.9* 7.7* 7.8*  HCT 30.1* 27.2* 26.4* 26.3*  MCV 85.3 82.7 81.7 82.2  PLT 394 387 344 101    Basic Metabolic Panel: Recent Labs  Lab 10/23/21 1320 10/24/21 0431 10/25/21 0422 10/26/21 0437  NA 139 136 134* 138  K 3.1* 3.4* 3.9 4.5  CL 107 106 103 107  CO2 22 21* 20* 22  GLUCOSE 158* 219* 297* 254*  BUN 34* 31* 36* 49*  CREATININE 1.19* 1.34* 1.34* 1.43*  CALCIUM 8.9 8.6* 8.4* 8.5*  MG  --   --  2.0 2.1    Liver Function Tests: Recent Labs  Lab 10/23/21 1320  AST 13*  ALT 14  ALKPHOS 140*  BILITOT 0.5  PROT 7.1  ALBUMIN 3.1*     Radiology Studies: ECHOCARDIOGRAM COMPLETE  Result Date: 10/25/2021    ECHOCARDIOGRAM REPORT   Patient Name:   Natalie Contreras Date of Exam: 10/25/2021 Medical Rec #:   [id number]       Height:       64.0 in Accession #:    [id number]      Weight:       205.7 lb Date of Birth:  22-Dec-1965      BSA:          1.980 m Patient Age:    62 years        BP:           143/61 mmHg Patient Gender: F               HR:           77 bpm. Exam Location:  Inpatient Procedure: 2D Echo Indications:    Acute diastolic CHF  History:        Patient has prior history of Echocardiogram examinations, most  recent 10/29/2016. CHF; Risk Factors:Diabetes and Hypertension.  Sonographer:    Arlyss Gandy Referring Phys: [id number] [place]  1. Left ventricular ejection fraction, by estimation, is 55 to 60%. The left ventricle has normal function. The left ventricle has no regional wall motion abnormalities. Left ventricular diastolic parameters are consistent with Grade III diastolic dysfunction (restrictive). Elevated left ventricular end-diastolic pressure.  2. Right ventricular systolic function is normal. The right ventricular size is mildly enlarged. There is severely elevated pulmonary artery systolic pressure. The estimated right ventricular systolic pressure is 41.9 mmHg.  3. Left atrial size was moderately dilated.  4. Right atrial size was mildly dilated.  5. The mitral valve is grossly normal. Mild to moderate mitral valve regurgitation.  6. Tricuspid valve regurgitation is mild to moderate.  7. The aortic valve is tricuspid. There is mild calcification of the aortic valve. There is mild thickening of the aortic valve. Aortic valve regurgitation is not visualized. Aortic valve sclerosis/calcification is present, without any evidence of aortic stenosis.  8. Aortic aortic arch not well visualized.  9. The inferior vena cava is dilated in size with <50% respiratory variability, suggesting right atrial pressure of 15 mmHg. Comparison(s): Changes from prior study are noted. Severely elevated pulmonary pressures on current study. FINDINGS  Left Ventricle: Left ventricular  ejection fraction, by estimation, is 55 to 60%. The left ventricle has normal function. The left ventricle has no regional wall motion abnormalities. The left ventricular internal cavity size was normal in size. There is  borderline left ventricular hypertrophy. Left ventricular diastolic parameters are consistent with Grade III diastolic dysfunction (restrictive). Elevated left ventricular end-diastolic pressure. Right Ventricle: The right ventricular size is mildly enlarged. Right vetricular wall thickness was not well visualized. Right ventricular systolic function is normal. There is severely elevated pulmonary artery systolic pressure. The tricuspid regurgitant velocity is 3.64 m/s, and with an assumed right atrial pressure of 15 mmHg, the estimated right ventricular systolic pressure is 37.9 mmHg. Left Atrium: Left atrial size was moderately dilated. Right Atrium: Right atrial size was mildly dilated. Pericardium: There is no evidence of pericardial effusion. Mitral Valve: The mitral valve is grossly normal. There is mild thickening of the mitral valve leaflet(s). There is mild calcification of the mitral valve leaflet(s). Mild to moderate mitral annular calcification. Mild to moderate mitral valve regurgitation. MV peak gradient, 11.0 mmHg. The mean mitral valve gradient is 4.0 mmHg. Tricuspid Valve: The tricuspid valve is grossly normal. Tricuspid valve regurgitation is mild to moderate. No evidence of tricuspid stenosis. Aortic Valve: The aortic valve is tricuspid. There is mild calcification of the aortic valve. There is mild thickening of the aortic valve. Aortic valve regurgitation is not visualized. Aortic valve sclerosis/calcification is present, without any evidence of aortic stenosis. Aortic valve mean gradient measures 9.5 mmHg. Aortic valve peak gradient measures 16.6 mmHg. Aortic valve area, by VTI measures 1.50 cm. Pulmonic Valve: The pulmonic valve was not well visualized. Pulmonic valve  regurgitation is not visualized. No evidence of pulmonic stenosis. Aorta: The aortic root, ascending aorta, aortic arch and descending aorta are all structurally normal, with no evidence of dilitation or obstruction and aortic arch not well visualized. Venous: The inferior vena cava is dilated in size with less than 50% respiratory variability, suggesting right atrial pressure of 15 mmHg. IAS/Shunts: The atrial septum is grossly normal.  LEFT VENTRICLE PLAX 2D LVIDd:         5.10 cm   Diastology LVIDs:  3.50 cm   LV e' medial:    6.53 cm/s LV PW:         1.10 cm   LV E/e' medial:  23.9 LV IVS:        1.10 cm   LV e' lateral:   8.59 cm/s LVOT diam:     2.00 cm   LV E/e' lateral: 18.2 LV SV:         78 LV SV Index:   40 LVOT Area:     3.14 cm  RIGHT VENTRICLE             IVC RV Basal diam:  4.10 cm     IVC diam: 2.50 cm RV Mid diam:    3.20 cm RV S prime:     11.60 cm/s TAPSE (M-mode): 1.8 cm LEFT ATRIUM             Index        RIGHT ATRIUM           Index LA diam:        4.20 cm 2.12 cm/m   RA Area:     17.30 cm LA Vol (A2C):   76.3 ml 38.54 ml/m  RA Volume:   44.50 ml  22.48 ml/m LA Vol (A4C):   61.1 ml 30.86 ml/m LA Biplane Vol: 68.5 ml 34.60 ml/m  AORTIC VALVE AV Area (Vmax):    1.51 cm AV Area (Vmean):   1.47 cm AV Area (VTI):     1.50 cm AV Vmax:           204.00 cm/s AV Vmean:          144.000 cm/s AV VTI:            0.522 m AV Peak Grad:      16.6 mmHg AV Mean Grad:      9.5 mmHg LVOT Vmax:         97.90 cm/s LVOT Vmean:        67.500 cm/s LVOT VTI:          0.249 m LVOT/AV VTI ratio: 0.48  AORTA Ao Asc diam: 2.90 cm MITRAL VALVE                TRICUSPID VALVE MV Area (PHT): 3.42 cm     TR Peak grad:   53.0 mmHg MV Area VTI:   1.96 cm     TR Vmax:        364.00 cm/s MV Peak grad:  11.0 mmHg MV Mean grad:  4.0 mmHg     SHUNTS MV Vmax:       1.66 m/s     Systemic VTI:  0.25 m MV Vmean:      85.4 cm/s    Systemic Diam: 2.00 cm MV Decel Time: 222 msec MV E velocity: 156.00 cm/s MV A velocity:  71.00 cm/s MV E/A ratio:  2.20 Buford Dresser MD Electronically signed by Buford Dresser MD Signature Date/Time: 10/25/2021/8:05:43 PM    Final       LOS: 2 days    Flora Lipps, MD [facility] 10/26/2021, 1:21 PM

## 2021-10-26 NOTE — Progress Notes (Signed)
Inpatient Diabetes Program Recommendations  AACE/ADA: New Consensus Statement on Inpatient Glycemic Control (2015)  Target Ranges:  Prepandial:   less than 140 mg/dL      Peak postprandial:   less than 180 mg/dL (1-2 hours)      Critically ill patients:  140 - 180 mg/dL    Latest Reference Range & Units 10/25/21 07:37 10/25/21 12:22 10/25/21 16:39 10/25/21 21:13  Glucose-Capillary 70 - 99 mg/dL 290 (H) 292 (H) 226 (H) 192 (H)    Latest Reference Range & Units 10/26/21 07:45  Glucose-Capillary 70 - 99 mg/dL 255 (H)  (H): Data is abnormally high    Home DM Meds: Jardiance 25 mg QD      OmniPod Insulin Pump (not wearing now)  Current Orders: Novolog Moderate Correction Scale/ SSI (0-15 units) TID AC + HS     Novolog 3 units TID with meals     Levemir 10 units QHS   Decadron 4 mg Q6 hours  Omnipod insulin pump & Dexcom CGM (at home) Basal--0.9 units/hour (total daily basal 21.6 units) Insulin carb ratio--1 units: 10 carbs Correction-- 1 units drops blood sugar 40 mg/dL Target 140-150     MD- Please consider:  1. Increase Levemir to 15 units QHS  2. Increase Novolog Meal Coverage to 5 units TID with meals     --Will follow patient during hospitalization--  Wyn Quaker RN, MSN, CDE Diabetes Coordinator Inpatient Glycemic Control Team Team Pager: [phone] (8a-5p)

## 2021-10-26 NOTE — Progress Notes (Signed)
Physical Therapy Treatment Patient Details Name: Natalie Contreras MRN: [id number] DOB: 17-Nov-1965 Today's Date: 10/26/2021   History of Present Illness 56 y.o. female with medical history significant of DM2 with neuropathy and PAD, R BKA, dCHF, HTN. 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 Pt started on radiation therapy during stay. Pt presented to ED 10/23/21 for evaluation of leg swelling and difficulty with walking.    PT Comments    Pt agreeable to attempt to mobilize however once standing, only able to take a few steps forwards and then backwards to recliner.  Pt reports feeling weak and fatigued.  Pt reports slow decline in mobility prior to admission and roommate is unable to assist her.  Pt hopeful to eventually live with her daughter in their "new" house that is handicap accessible however this does not sound currently available at this time.  Continue to recommend SNF upon d/c.    Recommendations for follow up therapy are one component of a multi-disciplinary discharge planning process, led by the attending physician.  Recommendations may be updated based on patient status, additional functional criteria and insurance authorization.  Follow Up Recommendations  Skilled nursing-short term rehab (<3 hours/day)     Assistance Recommended at Discharge Frequent or constant Supervision/Assistance  Patient can return home with the following A lot of help with walking and/or transfers;A lot of help with bathing/dressing/bathroom;Assistance with cooking/housework;Assist for transportation;Direct supervision/assist for medications management;Direct supervision/assist for financial management;Help with stairs or ramp for entrance   Equipment Recommendations  None recommended by PT    Recommendations for Other Services       Precautions / Restrictions Precautions Precautions: Fall Precaution Comments: R  BKA with prosthesis Restrictions Weight Bearing Restrictions: No     Mobility  Bed Mobility               General bed mobility comments: pt in recliner    Transfers Overall transfer level: Needs assistance Equipment used: Rolling walker (2 wheels) Transfers: Sit to/from Stand Sit to Stand: Min assist           General transfer comment: attempted x4 to stand and had great difficulty however also not wanting much assist if possible, increased time and effort, did require assist to rise and for stability while shifting hands onto RW    Ambulation/Gait Ambulation/Gait assistance: Min assist Gait Distance (Feet): 4 Feet Assistive device: Rolling walker (2 wheels) Gait Pattern/deviations: Step-through pattern, Decreased stride length, Trunk flexed       General Gait Details: pt with effortful movement of LEs and pt reports feeling very weak and unable to ambulate, only able to take a few steps forwards and then backwards   Stairs             Wheelchair Mobility    Modified Rankin (Stroke Patients Only)       Balance Overall balance assessment: Needs assistance         Standing balance support: Bilateral upper extremity supported, Reliant on assistive device for balance Standing balance-Leahy Scale: Poor                              Cognition Arousal/Alertness: Awake/alert Behavior During Therapy: WFL for tasks assessed/performed Overall Cognitive Status: Within Functional Limits for tasks assessed  Exercises      General Comments        Pertinent Vitals/Pain Pain Assessment Pain Assessment: No/denies pain    Home Living                          Prior Function            PT Goals (current goals can now be found in the care plan section) Progress towards PT goals: Progressing toward goals    Frequency    Min 3X/week      PT Plan Current plan  remains appropriate    Co-evaluation              AM-PAC PT "6 Clicks" Mobility   Outcome Measure  Help needed turning from your back to your side while in a flat bed without using bedrails?: A Little Help needed moving from lying on your back to sitting on the side of a flat bed without using bedrails?: A Little Help needed moving to and from a bed to a chair (including a wheelchair)?: A Lot Help needed standing up from a chair using your arms (e.g., wheelchair or bedside chair)?: A Lot Help needed to walk in hospital room?: A Lot Help needed climbing 3-5 steps with a railing? : Total 6 Click Score: 13    End of Session Equipment Utilized During Treatment: Gait belt Activity Tolerance: Patient tolerated treatment well Patient left: in chair;with call bell/phone within reach;with chair alarm set   PT Visit Diagnosis: Other abnormalities of gait and mobility (R26.89);Muscle weakness (generalized) (M62.81)     Time: 1610-9604 PT Time Calculation (min) (ACUTE ONLY): 18 min  Charges:  $Therapeutic Activity: 8-22 mins                    Jannette Spanner PT, DPT Acute Rehabilitation Services Pager: [phone] Office: [place] 10/26/2021, 3:33 PM

## 2021-10-27 LAB — BASIC METABOLIC PANEL
Anion gap: 9 (ref 5–15)
BUN: 63 mg/dL — ABNORMAL HIGH (ref 6–20)
CO2: 22 mmol/L (ref 22–32)
Calcium: 8.6 mg/dL — ABNORMAL LOW (ref 8.9–10.3)
Chloride: 107 mmol/L (ref 98–111)
Creatinine, Ser: 1.46 mg/dL — ABNORMAL HIGH (ref 0.44–1.00)
GFR, Estimated: 43 mL/min — ABNORMAL LOW (ref >60)
Glucose, Bld: 259 mg/dL — ABNORMAL HIGH (ref 70–99)
Potassium: 4.7 mmol/L (ref 3.5–5.1)
Sodium: 138 mmol/L (ref 135–145)

## 2021-10-27 LAB — CBC
HCT: 27.9 % — ABNORMAL LOW (ref 36.0–46.0)
Hemoglobin: 8 g/dL — ABNORMAL LOW (ref 12.0–15.0)
MCH: 23.9 pg — ABNORMAL LOW (ref 26.0–34.0)
MCHC: 28.7 g/dL — ABNORMAL LOW (ref 30.0–36.0)
MCV: 83.3 fL (ref 80.0–100.0)
Platelets: 328 10*3/uL (ref 150–400)
RBC: 3.35 MIL/uL — ABNORMAL LOW (ref 3.87–5.11)
RDW: 20 % — ABNORMAL HIGH (ref 11.5–15.5)
WBC: 5.9 10*3/uL (ref 4.0–10.5)
nRBC: 0 % (ref 0.0–0.2)

## 2021-10-27 LAB — GLUCOSE, CAPILLARY
Glucose-Capillary: 284 mg/dL — ABNORMAL HIGH (ref 70–99)
Glucose-Capillary: 265 mg/dL — ABNORMAL HIGH (ref 70–99)
Glucose-Capillary: 189 mg/dL — ABNORMAL HIGH (ref 70–99)
Glucose-Capillary: 219 mg/dL — ABNORMAL HIGH (ref 70–99)

## 2021-10-27 MED ORDER — LIP MEDEX EX OINT
TOPICAL_OINTMENT | CUTANEOUS | Status: DC | PRN
  Filled 2021-10-27: qty 7

## 2021-10-27 MED ORDER — FUROSEMIDE 40 MG PO TABS
40.0000 mg | ORAL_TABLET | Freq: Every day | ORAL | Status: DC
  Administered 2021-10-27 – 2021-10-30 (×4): 40 mg via ORAL
  Filled 2021-10-27 (×4): qty 1

## 2021-10-27 NOTE — Progress Notes (Signed)
PROGRESS NOTE    [name]  GYI:[id number] DOB: 06/05/1966 DOA: 10/23/2021 PCP: Celene Squibb, MD    Brief Narrative:  Natalie Contreras is a 56 y.o. 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  She was supposed to go for radiation oncology appointment but could not go so she decided to come to the hospital.  In the ED patient had a CT scan done which showed multiple intracranial metastasis some hemorrhagic and some with associated vasogenic edema which was progressive in nature.  Patient was then considered for admission to hospital for further evaluation treatment and assessment.  During hospitalization, patient was seen by radiation oncology and underwent simulation for radiation treatment on 10/24/2021.  Currently awaiting for skilled nursing facility placement but no bed offer so far.  Likely disposition home after the weekend but will need transportation support.    Assessment and Plan: * Ambulatory dysfunction- (present on admission) Patient is right below-knee amputee uses prosthesis at baseline  been having difficulty ambulating recently.  CT scan shows hemorrhagic metastasis with vasogenic edema.    Physical therapy recommends skilled nursing facility on discharge but no bed offers yet.  Might need to go home with home health and the patient states that she would need transportation  back and forth to radiation  Metastatic cancer to brain [facility])- (present on admission) CT head scan showed worsened / increased appearance of lesions on CT possibly due to increased hemorrhage / vasogenic edema.  Patient has been started on IV Decadron.  Plan  for radiation to be initiated soon.  Radiation oncology on board.  Renal cell carcinoma ([place])- (present on admission) With brain and gastric metastasis.  Radiation treatment as per radiation oncology.    DM2 (diabetes mellitus, type 2) (HCC) With hyperglycemia.  Mild acidosis noted on admission so Jardiance on hold.  Patient is on insulin pump at home.  Continue Levemir, mealtime and and sliding scale insulin at this time.  Patient is also on Decadron.  Diabetic coordinator on board.  Will follow recommendation  Acute blood loss anemia- (present on admission) Hemoglobin was 7.7 on last discharge.  Hemoglobin today at 8.0.  Patient does have history of GI bleed from fungating ulcer in the stomach which was confirmed to be metastatic renal cell carcinoma.  Continue to monitor CBC daily.  Acute on chronic diastolic CHF (congestive heart failure) ([place])- (present on admission) Patient with increased peripheral edema from baseline.  Continue with IV diuretics.  On IV 40 Lasix daily.  No overt shortness of breath.  Creatinine at 1.4 from 1.3< 1.3.  2D echocardiogram from 10/25/2021 showed LV ejection fraction of 55 to 60% with grade 3 diastolic dysfunction.  We will change her Lasix to oral from today since edema has improved.  Benign essential HTN- (present on admission) On the increased dose of Coreg.  Added hydralazine since yesterday.  We will continue to monitor.  We will add hydralazine for now.    DVT prophylaxis: SCDs Start: 10/24/21 0422   Code Status:     Code Status: Full Code  Disposition: Skilled nursing facility as per PT evaluation but likely home.  Patient will need transportation to radiation treatment as per the patient.  Status is: Inpatient  Remains inpatient appropriate because: Ambulatory dysfunction, need for placement, ongoing radiation treatment   Family Communication:  I spoke with the patient's daughter on 10/24/21  Consultants:  Radiation  oncology Hematooncology   Procedures:  Radiation treatment  Antimicrobials:  None  Anti-infectives (From admission, onward)    None       Subjective: Today, patient was seen and examined at bedside.  Denies any nausea vomiting fever chills or rigor.  Leg pain has improved.  Swelling has improved as well.   Objective: Vitals:   10/26/21 1416 10/26/21 1628 10/26/21 2002 10/27/21 0544  BP: (!) 145/51 (!) 163/55 (!) 145/60 (!) 173/54  Pulse: 72 70 72 74  Resp: 18  18 18   Temp: (!) 97.3 F (36.3 C)  (!) 97.5 F (36.4 C) 98.5 F (36.9 C)  TempSrc: Oral  Oral Oral  SpO2: 98%  97% 98%  Weight:      Height:        Intake/Output Summary (Last 24 hours) at 10/27/2021 1105 Last data filed at 10/27/2021 1607 Gross per 24 hour  Intake 360 ml  Output 350 ml  Net 10 ml   Filed Weights   10/25/21 0623 10/26/21 0451  Weight: 93.3 kg 95.1 kg    Physical Examination:  General: Obese built, not in obvious distress HENT:   No scleral pallor or icterus noted. Oral mucosa is moist.  Chest:  Clear breath sounds.  Diminished breath sounds bilaterally. No crackles or wheezes.  CVS: S1 &S2 heard. No murmur.  Regular rate and rhythm. Abdomen: Soft, nontender, nondistended.  Bowel sounds are heard.   Extremities: Right below-knee amputation, left lower extremity mild edema.  Improved Psych: Alert, awake and oriented, normal mood CNS:  No cranial nerve deficits.  Power equal in all extremities.   Skin: Warm and dry.  No rashes noted.  Data Reviewed:   CBC: Recent Labs  Lab 10/23/21 1320 10/24/21 0431 10/25/21 0422 10/26/21 0437 10/27/21 0439  WBC 5.7 5.7 6.8 7.3 5.9  NEUTROABS 4.0  --   --   --   --   HGB 8.6* 7.9* 7.7* 7.8* 8.0*  HCT 30.1* 27.2* 26.4* 26.3* 27.9*  MCV 85.3 82.7 81.7 82.2 83.3  PLT 394 387 344 319 371    Basic Metabolic Panel: Recent Labs  Lab 10/23/21 1320 10/24/21 0431 10/25/21 0422 10/26/21 0437 10/27/21 0439  NA 139 136 134* 138 138  K 3.1*  3.4* 3.9 4.5 4.7  CL 107 106 103 107 107  CO2 22 21* 20* 22 22  GLUCOSE 158* 219* 297* 254* 259*  BUN 34* 31* 36* 49* 63*  CREATININE 1.19* 1.34* 1.34* 1.43* 1.46*  CALCIUM 8.9 8.6* 8.4* 8.5* 8.6*  MG  --   --  2.0 2.1  --     Liver Function Tests: Recent Labs  Lab 10/23/21 1320  AST 13*  ALT 14  ALKPHOS 140*  BILITOT 0.5  PROT 7.1  ALBUMIN 3.1*     Radiology Studies: ECHOCARDIOGRAM COMPLETE  Result Date: 10/25/2021    ECHOCARDIOGRAM REPORT   Patient Name:   QUINCEY NORED Date of Exam: 10/25/2021 Medical Rec #:  [id number]       Height:       64.0 in Accession #:    [id number]      Weight:       205.7 lb Date of Birth:  1965-12-20      BSA:          1.980 m Patient Age:  55 years        BP:           143/61 mmHg Patient Gender: F               HR:           77 bpm. Exam Location:  Inpatient Procedure: 2D Echo Indications:    Acute diastolic CHF  History:        Patient has prior history of Echocardiogram examinations, most                 recent 10/29/2016. CHF; Risk Factors:Diabetes and Hypertension.  Sonographer:    Arlyss Gandy Referring Phys: [id number] [place]  1. Left ventricular ejection fraction, by estimation, is 55 to 60%. The left ventricle has normal function. The left ventricle has no regional wall motion abnormalities. Left ventricular diastolic parameters are consistent with Grade III diastolic dysfunction (restrictive). Elevated left ventricular end-diastolic pressure.  2. Right ventricular systolic function is normal. The right ventricular size is mildly enlarged. There is severely elevated pulmonary artery systolic pressure. The estimated right ventricular systolic pressure is 42.8 mmHg.  3. Left atrial size was moderately dilated.  4. Right atrial size was mildly dilated.  5. The mitral valve is grossly normal. Mild to moderate mitral valve regurgitation.  6. Tricuspid valve regurgitation is mild to moderate.  7. The aortic valve is tricuspid. There is  mild calcification of the aortic valve. There is mild thickening of the aortic valve. Aortic valve regurgitation is not visualized. Aortic valve sclerosis/calcification is present, without any evidence of aortic stenosis.  8. Aortic aortic arch not well visualized.  9. The inferior vena cava is dilated in size with <50% respiratory variability, suggesting right atrial pressure of 15 mmHg. Comparison(s): Changes from prior study are noted. Severely elevated pulmonary pressures on current study. FINDINGS  Left Ventricle: Left ventricular ejection fraction, by estimation, is 55 to 60%. The left ventricle has normal function. The left ventricle has no regional wall motion abnormalities. The left ventricular internal cavity size was normal in size. There is  borderline left ventricular hypertrophy. Left ventricular diastolic parameters are consistent with Grade III diastolic dysfunction (restrictive). Elevated left ventricular end-diastolic pressure. Right Ventricle: The right ventricular size is mildly enlarged. Right vetricular wall thickness was not well visualized. Right ventricular systolic function is normal. There is severely elevated pulmonary artery systolic pressure. The tricuspid regurgitant velocity is 3.64 m/s, and with an assumed right atrial pressure of 15 mmHg, the estimated right ventricular systolic pressure is 76.8 mmHg. Left Atrium: Left atrial size was moderately dilated. Right Atrium: Right atrial size was mildly dilated. Pericardium: There is no evidence of pericardial effusion. Mitral Valve: The mitral valve is grossly normal. There is mild thickening of the mitral valve leaflet(s). There is mild calcification of the mitral valve leaflet(s). Mild to moderate mitral annular calcification. Mild to moderate mitral valve regurgitation. MV peak gradient, 11.0 mmHg. The mean mitral valve gradient is 4.0 mmHg. Tricuspid Valve: The tricuspid valve is grossly normal. Tricuspid valve regurgitation is mild  to moderate. No evidence of tricuspid stenosis. Aortic Valve: The aortic valve is tricuspid. There is mild calcification of the aortic valve. There is mild thickening of the aortic valve. Aortic valve regurgitation is not visualized. Aortic valve sclerosis/calcification is present, without any evidence of aortic stenosis. Aortic valve mean gradient measures 9.5 mmHg. Aortic valve peak gradient measures 16.6 mmHg. Aortic valve area, by VTI measures 1.50 cm. Pulmonic Valve: The pulmonic  valve was not well visualized. Pulmonic valve regurgitation is not visualized. No evidence of pulmonic stenosis. Aorta: The aortic root, ascending aorta, aortic arch and descending aorta are all structurally normal, with no evidence of dilitation or obstruction and aortic arch not well visualized. Venous: The inferior vena cava is dilated in size with less than 50% respiratory variability, suggesting right atrial pressure of 15 mmHg. IAS/Shunts: The atrial septum is grossly normal.  LEFT VENTRICLE PLAX 2D LVIDd:         5.10 cm   Diastology LVIDs:         3.50 cm   LV e' medial:    6.53 cm/s LV PW:         1.10 cm   LV E/e' medial:  23.9 LV IVS:        1.10 cm   LV e' lateral:   8.59 cm/s LVOT diam:     2.00 cm   LV E/e' lateral: 18.2 LV SV:         78 LV SV Index:   40 LVOT Area:     3.14 cm  RIGHT VENTRICLE             IVC RV Basal diam:  4.10 cm     IVC diam: 2.50 cm RV Mid diam:    3.20 cm RV S prime:     11.60 cm/s TAPSE (M-mode): 1.8 cm LEFT ATRIUM             Index        RIGHT ATRIUM           Index LA diam:        4.20 cm 2.12 cm/m   RA Area:     17.30 cm LA Vol (A2C):   76.3 ml 38.54 ml/m  RA Volume:   44.50 ml  22.48 ml/m LA Vol (A4C):   61.1 ml 30.86 ml/m LA Biplane Vol: 68.5 ml 34.60 ml/m  AORTIC VALVE AV Area (Vmax):    1.51 cm AV Area (Vmean):   1.47 cm AV Area (VTI):     1.50 cm AV Vmax:           204.00 cm/s AV Vmean:          144.000 cm/s AV VTI:            0.522 m AV Peak Grad:      16.6 mmHg AV Mean Grad:       9.5 mmHg LVOT Vmax:         97.90 cm/s LVOT Vmean:        67.500 cm/s LVOT VTI:          0.249 m LVOT/AV VTI ratio: 0.48  AORTA Ao Asc diam: 2.90 cm MITRAL VALVE                TRICUSPID VALVE MV Area (PHT): 3.42 cm     TR Peak grad:   53.0 mmHg MV Area VTI:   1.96 cm     TR Vmax:        364.00 cm/s MV Peak grad:  11.0 mmHg MV Mean grad:  4.0 mmHg     SHUNTS MV Vmax:       1.66 m/s     Systemic VTI:  0.25 m MV Vmean:      85.4 cm/s    Systemic Diam: 2.00 cm MV Decel Time: 222 msec MV E velocity: 156.00 cm/s MV A velocity: 71.00 cm/s MV E/A ratio:  2.20 Buford Dresser MD Electronically signed by Buford Dresser MD Signature Date/Time: 10/25/2021/8:05:43 PM    Final       LOS: 3 days    Flora Lipps, MD [facility] 10/27/2021, 11:05 AM

## 2021-10-27 NOTE — Plan of Care (Signed)

## 2021-10-28 DIAGNOSIS — E875 Hyperkalemia: Secondary | ICD-10-CM

## 2021-10-28 LAB — CBC
HCT: 28.8 % — ABNORMAL LOW (ref 36.0–46.0)
Hemoglobin: 8.4 g/dL — ABNORMAL LOW (ref 12.0–15.0)
MCH: 24.1 pg — ABNORMAL LOW (ref 26.0–34.0)
MCHC: 29.2 g/dL — ABNORMAL LOW (ref 30.0–36.0)
MCV: 82.5 fL (ref 80.0–100.0)
Platelets: 323 10*3/uL (ref 150–400)
RBC: 3.49 MIL/uL — ABNORMAL LOW (ref 3.87–5.11)
RDW: 20.5 % — ABNORMAL HIGH (ref 11.5–15.5)
WBC: 6.7 10*3/uL (ref 4.0–10.5)
nRBC: 0 % (ref 0.0–0.2)

## 2021-10-28 LAB — GLUCOSE, CAPILLARY
Glucose-Capillary: 209 mg/dL — ABNORMAL HIGH (ref 70–99)
Glucose-Capillary: 214 mg/dL — ABNORMAL HIGH (ref 70–99)
Glucose-Capillary: 201 mg/dL — ABNORMAL HIGH (ref 70–99)
Glucose-Capillary: 295 mg/dL — ABNORMAL HIGH (ref 70–99)

## 2021-10-28 LAB — BASIC METABOLIC PANEL
Anion gap: 9 (ref 5–15)
BUN: 67 mg/dL — ABNORMAL HIGH (ref 6–20)
CO2: 20 mmol/L — ABNORMAL LOW (ref 22–32)
Calcium: 8.5 mg/dL — ABNORMAL LOW (ref 8.9–10.3)
Chloride: 108 mmol/L (ref 98–111)
Creatinine, Ser: 1.43 mg/dL — ABNORMAL HIGH (ref 0.44–1.00)
GFR, Estimated: 44 mL/min — ABNORMAL LOW (ref >60)
Glucose, Bld: 208 mg/dL — ABNORMAL HIGH (ref 70–99)
Potassium: 5.5 mmol/L — ABNORMAL HIGH (ref 3.5–5.1)
Sodium: 137 mmol/L (ref 135–145)

## 2021-10-28 MED ORDER — HYDROCORTISONE ACETATE 25 MG RE SUPP
25.0000 mg | Freq: Two times a day (BID) | RECTAL | Status: DC
  Administered 2021-10-28: 25 mg via RECTAL
  Filled 2021-10-28 (×5): qty 1

## 2021-10-28 MED ORDER — LIDOCAINE HCL URETHRAL/MUCOSAL 2 % EX GEL
1.0000 "application " | Freq: Three times a day (TID) | CUTANEOUS | Status: DC | PRN
  Filled 2021-10-28 (×3): qty 5

## 2021-10-28 NOTE — Assessment & Plan Note (Addendum)
Resolved.  Latest potassium of 4.7.

## 2021-10-28 NOTE — Progress Notes (Signed)
PROGRESS NOTE    Natalie Contreras  VQQ:[id number] DOB: [birth year] DOA: 10/23/2021 PCP: Celene Squibb, MD    Brief Narrative:  Natalie Contreras is a 56 y.o. 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  She was supposed to go for radiation oncology appointment but could not go so she decided to come to the hospital.  In the ED patient had a CT scan done which showed multiple intracranial metastasis some hemorrhagic and some with associated vasogenic edema which was progressive in nature.  Patient was then considered for admission to hospital for further evaluation treatment and assessment.  During hospitalization, patient was seen by radiation oncology and underwent simulation for radiation treatment on 10/24/2021.  Currently awaiting for skilled nursing facility placement but no bed offer so far.  Likely disposition home after the weekend but will need transportation support.    Assessment and Plan: * Ambulatory dysfunction- (present on admission) Patient is right below-knee amputee uses prosthesis at baseline  been having difficulty ambulating recently.  CT scan shows hemorrhagic metastasis with vasogenic edema.    Physical therapy recommends skilled nursing facility on discharge but no bed offers yet.  Might need to go home with home health and the patient states that she would need transportation  back and forth to radiation  Metastatic cancer to brain [facility])- (present on admission) CT head scan showed worsened / increased appearance of lesions on CT possibly due to increased hemorrhage / vasogenic edema.  Patient has been started on IV Decadron.  Plan  for radiation to be initiated soon.  Simulation has been done.  Radiation oncology on board.  Hyperkalemia Borderline high.  We will continue to monitor.  Avoid potassium supplements.  Check levels in a.m.  Renal cell carcinoma ([place])- (present on admission) With brain and gastric metastasis.  Radiation treatment as per radiation oncology.    DM2 (diabetes mellitus, type 2) (HCC) With hyperglycemia.  Mild acidosis noted on admission so Jardiance on hold.  Patient is on insulin pump at home.  Continue Levemir, mealtime and and sliding scale insulin at this time.  Patient is also on Decadron.  Diabetic coordinator on board.  Will follow recommendation  Acute blood loss anemia- (present on admission) Hemoglobin was 7.7 on last discharge.  Recent hemoglobin at 8.4..  Patient does have history of GI bleed from fungating ulcer in the stomach which was confirmed to be metastatic renal cell carcinoma.  Continue to monitor CBC daily.  Acute on chronic diastolic CHF (congestive heart failure) ([place])- (present on admission) Patient with increased peripheral edema from baseline.  Initially received IV Lasix.  This has been changed to oral Lasix at this time..  Creatinine at 1.4 from 1.4<1.3< 1.3.  2D echocardiogram from 10/25/2021 showed LV ejection fraction of 55 to 60% with grade 3 diastolic dysfunction. Edema has improved.  Benign essential HTN- (present on admission) On the increased dose of Coreg.  Added hydralazine since yesterday.  We will continue to monitor.  We will add hydralazine for now.    DVT prophylaxis: SCDs Start: 10/24/21 0422   Code Status:     Code Status: Full Code  Disposition:  Skilled nursing facility as per PT evaluation but likely home.  Patient will  need transportation to radiation treatment as per the patient.  Status is: Inpatient  Remains inpatient appropriate because: Ambulatory dysfunction, need for placement/disposition issue, ongoing radiation treatment   Family  Communication:  Spoke with the patient at bedside.  Consultants:  Radiation oncology Hematooncology  Procedures:  Radiation treatment  Antimicrobials:  None  Anti-infectives (From admission, onward)    None       Subjective: Today, patient was seen and examined at bedside.  Denies any nausea vomiting, abdominal pain.  No shortness of breath cough fever chills  Objective: Vitals:   10/27/21 1323 10/27/21 2053 10/28/21 0445 10/28/21 0521  BP: (!) 167/56 (!) 153/53 (!) 169/62 (!) 154/58  Pulse: 70 75 68   Resp:  20 20   Temp: 98.1 F (36.7 C) 97.7 F (36.5 C) 97.6 F (36.4 C)   TempSrc: Oral Oral Oral   SpO2: 97% 95% 98%   Weight:      Height:        Intake/Output Summary (Last 24 hours) at 10/28/2021 1022 Last data filed at 10/28/2021 0950 Gross per 24 hour  Intake 860 ml  Output 1500 ml  Net -640 ml   Filed Weights   10/25/21 0623 10/26/21 0451  Weight: 93.3 kg 95.1 kg    Physical Examination:  General: Obese built, not in obvious distress HENT:   No scleral pallor or icterus noted. Oral mucosa is moist.  Chest:  Clear breath sounds.  Diminished breath sounds bilaterally. No crackles or wheezes.  CVS: S1 &S2 heard. No murmur.  Regular rate and rhythm. Abdomen: Soft, nontender, nondistended.  Bowel sounds are heard.   Extremities: Right below-knee amputation, left lower extremity mild edema. Psych: Alert, awake and oriented, normal mood CNS:  No cranial nerve deficits.  Power equal in all extremities.   Skin: Warm and dry.  No rashes noted.  Data Reviewed:   CBC: Recent Labs  Lab 10/23/21 1320 10/24/21 0431 10/25/21 0422 10/26/21 0437 10/27/21 0439 10/28/21 0714  WBC 5.7 5.7 6.8 7.3 5.9 6.7  NEUTROABS 4.0  --   --   --   --   --   HGB 8.6* 7.9* 7.7* 7.8* 8.0* 8.4*  HCT 30.1* 27.2* 26.4* 26.3* 27.9* 28.8*  MCV 85.3 82.7 81.7 82.2 83.3 82.5  PLT 394 387 344 319 328 154    Basic Metabolic Panel: Recent Labs  Lab 10/24/21 0431  10/25/21 0422 10/26/21 0437 10/27/21 0439 10/28/21 0443  NA 136 134* 138 138 137  K 3.4* 3.9 4.5 4.7 5.5*  CL 106 103 107 107 108  CO2 21* 20* 22 22 20*  GLUCOSE 219* 297* 254* 259* 208*  BUN 31* 36* 49* 63* 67*  CREATININE 1.34* 1.34* 1.43* 1.46* 1.43*  CALCIUM 8.6* 8.4* 8.5* 8.6* 8.5*  MG  --  2.0 2.1  --   --     Liver Function Tests: Recent Labs  Lab 10/23/21 1320  AST 13*  ALT 14  ALKPHOS 140*  BILITOT 0.5  PROT 7.1  ALBUMIN 3.1*     Radiology Studies: No results found.    LOS: 4 days    Flora Lipps, MD [facility] 10/28/2021, 10:22 AM

## 2021-10-28 NOTE — Plan of Care (Signed)
  Problem: Education: Goal: Knowledge of General Education information will improve Description: Including pain rating scale, medication(s)/side effects and non-pharmacologic comfort measures Outcome: Progressing   Problem: Activity: Goal: Risk for activity intolerance will decrease Outcome: Progressing   Problem: Nutrition: Goal: Adequate nutrition will be maintained Outcome: Progressing   Problem: Coping: Goal: Level of anxiety will decrease Outcome: Progressing   

## 2021-10-28 NOTE — Plan of Care (Signed)

## 2021-10-29 ENCOUNTER — Ambulatory Visit
Admission: RE | Admit: 2021-10-29 | Discharge: 2021-10-29 | Disposition: A | Discharge status: Home / Self Care | Payer: Medicaid Other | Source: Ambulatory Visit | Attending: Radiation Oncology | Admitting: Radiation Oncology

## 2021-10-29 LAB — GLUCOSE, CAPILLARY
Glucose-Capillary: 205 mg/dL — ABNORMAL HIGH (ref 70–99)
Glucose-Capillary: 304 mg/dL — ABNORMAL HIGH (ref 70–99)
Glucose-Capillary: 211 mg/dL — ABNORMAL HIGH (ref 70–99)
Glucose-Capillary: 209 mg/dL — ABNORMAL HIGH (ref 70–99)
Glucose-Capillary: 251 mg/dL — ABNORMAL HIGH (ref 70–99)

## 2021-10-29 LAB — BASIC METABOLIC PANEL
Anion gap: 8 (ref 5–15)
BUN: 72 mg/dL — ABNORMAL HIGH (ref 6–20)
CO2: 22 mmol/L (ref 22–32)
Calcium: 8.8 mg/dL — ABNORMAL LOW (ref 8.9–10.3)
Chloride: 107 mmol/L (ref 98–111)
Creatinine, Ser: 1.07 mg/dL — ABNORMAL HIGH (ref 0.44–1.00)
GFR, Estimated: >60 mL/min (ref >60)
Glucose, Bld: 206 mg/dL — ABNORMAL HIGH (ref 70–99)
Potassium: 4.7 mmol/L (ref 3.5–5.1)
Sodium: 137 mmol/L (ref 135–145)

## 2021-10-29 MED ORDER — OXYCODONE-ACETAMINOPHEN 5-325 MG PO TABS: 1.0000 | ORAL_TABLET | Freq: Four times a day (QID) | ORAL | 0 refills | Status: AC | PRN

## 2021-10-29 MED ORDER — FUROSEMIDE 40 MG PO TABS: 40.0000 mg | ORAL_TABLET | Freq: Every day | ORAL | 2 refills | Status: AC

## 2021-10-29 MED ORDER — LORAZEPAM 1 MG PO TABS
1.0000 mg | ORAL_TABLET | Freq: Once | ORAL | Status: AC
  Administered 2021-10-29: 1 mg via ORAL
  Filled 2021-10-29: qty 1

## 2021-10-29 MED ORDER — HYDRALAZINE HCL 25 MG PO TABS: 25.0000 mg | ORAL_TABLET | Freq: Three times a day (TID) | ORAL | 2 refills | Status: AC

## 2021-10-29 MED ORDER — DEXAMETHASONE 4 MG PO TABS
4.0000 mg | ORAL_TABLET | Freq: Three times a day (TID) | ORAL | 0 refills | Status: AC
Start: 2021-10-29 — End: 2021-11-08

## 2021-10-29 MED ORDER — ONDANSETRON HCL 4 MG PO TABS: 4.0000 mg | ORAL_TABLET | Freq: Four times a day (QID) | ORAL | 0 refills | Status: DC | PRN

## 2021-10-29 MED ORDER — HYDROCORTISONE ACETATE 25 MG RE SUPP: 25.0000 mg | Freq: Two times a day (BID) | RECTAL | 0 refills | Status: DC

## 2021-10-29 MED ORDER — CARVEDILOL 6.25 MG PO TABS: 6.2500 mg | ORAL_TABLET | Freq: Two times a day (BID) | ORAL | 2 refills | Status: AC

## 2021-10-29 NOTE — Progress Notes (Signed)
Physical Therapy Treatment Patient Details Name: Natalie Contreras MRN: [id number] DOB: Nov 08, 1965 Today's Date: 10/29/2021   History of Present Illness 56 y.o. female with medical history significant of DM2 with neuropathy and PAD, R BKA, dCHF, HTN. 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 Pt started on radiation therapy during stay. Pt presented to ED 10/23/21 for evaluation of leg swelling and difficulty with walking.    PT Comments    Asked to see pt today 2* poor mobility performance with nursing on yesterday. Pt has poor insight into her deficits. She gives conflicting information regarding assistance and supervision level that will be available to her at home. She reports her prosthesis does not fit well which seems to be a new issue since she was ambulatory with her prosthesis at [facility] ~ 2 weeks ago. Pt was unable to perform any mobility tasks without significant assistance on today. She could not laterally scoot at all. Pt is adamant that she will have appropriate assistance at home. I have concerns about this but I have to take pt's word for it. I did ask TOC team Beverlee Nims) if they were able to speak with family/roommate to confirm since pt reports they will be helping her at home. Will plan to try to see pt again on tomorrow to see if she can stand with/without prosthesis-if she is willing. My recommendation remains the same, especially with pt's questionable d/c plan/arrangements.     Recommendations for follow up therapy are one component of a multi-disciplinary discharge planning process, led by the attending physician.  Recommendations may be updated based on patient status, additional functional criteria and insurance authorization.  Follow Up Recommendations  Skilled nursing-short term rehab (<3 hours/day) (unfortunately no bed offers)     Assistance Recommended at Discharge Frequent or constant  Supervision/Assistance  Patient can return home with the following A lot of help with walking and/or transfers;A lot of help with bathing/dressing/bathroom;Assistance with cooking/housework;Assist for transportation;Direct supervision/assist for medications management;Direct supervision/assist for financial management;Help with stairs or ramp for entrance   Equipment Recommendations  None recommended by PT    Recommendations for Other Services       Precautions / Restrictions Precautions Precautions: Fall Precaution Comments: R BKA with prosthesis Restrictions Weight Bearing Restrictions: No     Mobility  Bed Mobility Overal bed mobility: Needs Assistance Bed Mobility: Supine to Sit     Supine to sit: Mod assist, HOB elevated Sit to supine: Min assist, HOB elevated   General bed mobility comments: Increased time. Pt could not complete supine to sit task. She stopped midway and stated "that's it.". Mod A and use of bedpad to scoot pt completely to EOB. Assist also required to return to bed.    Transfers                   General transfer comment: Pt reports prostesis does not fit well. So, attempted to work on lateral scooting. Unfortunately, pt's wheelchair in room functions poor  (R armrest unable to be removed). So instead had pt work on scooting along EOB-pt unable.    Ambulation/Gait                   Stairs             Wheelchair Mobility    Modified Rankin (Stroke Patients Only)       Balance Overall balance assessment: Needs assistance Sitting-balance  support: Single extremity supported, Feet supported Sitting balance-Leahy Scale: Fair                                      Cognition Arousal/Alertness: Awake/alert Behavior During Therapy: WFL for tasks assessed/performed Overall Cognitive Status: Within Functional Limits for tasks assessed                                 General Comments: poor insight.  pt gives conflicting information regarding assistance available, prior performance, plan for managing at home        Exercises      General Comments        Pertinent Vitals/Pain Pain Assessment Pain Assessment: Faces Faces Pain Scale: No hurt Pain Intervention(s): Limited activity within patient's tolerance, Monitored during session, Repositioned    Home Living                          Prior Function            PT Goals (current goals can now be found in the care plan section) Progress towards PT goals: Not progressing toward goals - comment    Frequency    Min 3X/week      PT Plan Current plan remains appropriate    Co-evaluation              AM-PAC PT "6 Clicks" Mobility   Outcome Measure  Help needed turning from your back to your side while in a flat bed without using bedrails?: A Little Help needed moving from lying on your back to sitting on the side of a flat bed without using bedrails?: A Lot Help needed moving to and from a bed to a chair (including a wheelchair)?: Total Help needed standing up from a chair using your arms (e.g., wheelchair or bedside chair)?: Total Help needed to walk in hospital room?: Total Help needed climbing 3-5 steps with a railing? : Total 6 Click Score: 9    End of Session   Activity Tolerance: Patient tolerated treatment well Patient left: in bed;with call bell/phone within reach;with bed alarm set   PT Visit Diagnosis: Other abnormalities of gait and mobility (R26.89);Muscle weakness (generalized) (M62.81)     Time: 5885-0277 PT Time Calculation (min) (ACUTE ONLY): 21 min  Charges:  $Therapeutic Activity: 8-22 mins                        Doreatha Massed, PT Acute Rehabilitation  Office: [phone] Pager: [phone]

## 2021-10-29 NOTE — TOC Transition Note (Addendum)
Transition of Care [facility]) - CM/SW Discharge Note   Patient Details  Name: Natalie Contreras MRN: [id number] Date of Birth: August 16, 1966  Transition of Care [facility]) CM/SW Contact:  Dessa Phi, RN Phone Number: 10/29/2021, 10:12 AM   Clinical Narrative: See prior notes-no [place] agency to accept:[facility]- Patient agree to d/c home.[facility] transport for transportation-to & home to xrt treatments await call back-will fax waiver,& schedule to fax#[phone],also in shadow chart. [facility] to be arranged for transport home. Patient will contact personal care services for custodial level asst @ home. No further CM needs. Noted patient has w/c-[facility] transport [phone] arranged for w/c transport 3p pick up-faxed w/confirmation to #[phone] [facility] transport info-forms in shadow chart.    Final next level of care: Home/Self Care Barriers to Discharge: No Barriers Identified   Patient Goals and CMS Choice Patient states their goals for this hospitalization and ongoing recovery are:: home CMS Medicare.gov Compare Post Acute Care list provided to:: Patient Choice offered to / list presented to : Patient  Discharge Placement                       Discharge Plan and Services   Discharge Planning Services: CM Consult Post Acute Care Choice: NA (Patient will f/u custodial level services)                    HH Arranged:  (No [place] agency to accept insurance)          Social Determinants of Health (SDOH) Interventions     Readmission Risk Interventions Readmission Risk Prevention Plan 10/18/2021 10/17/2021  Transportation Screening - Complete  PCP or Specialist Appt within 3-5 Days - Complete  HRI or Home Care Consult Complete -  Social Work Consult for [place] Planning/Counseling - Complete  Palliative Care Screening - Not Applicable  Medication Review Press photographer) - Complete  Some recent data might be hidden

## 2021-10-29 NOTE — Op Note (Signed)
Name: Natalie Contreras    MRN: [id number]   Date: 10/29/2021    DOB: [birth year]   STEREOTACTIC RADIOSURGERY OPERATIVE NOTE  PRE-OPERATIVE DIAGNOSIS:  metastatic brain disease  POST-OPERATIVE DIAGNOSIS:  metastatic brain disease  PROCEDURE:   Stereotactic Radiosurgery using TrueBeam Linac device to complex lesion (> 3 cm) and simple lesion x 11  SURGEON:  Duffy Rhody, MD  RADIATION ONCOLOGIST: Eppie Gibson, MD  TECHNIQUE:  The patient underwent a radiation treatment planning session in the radiation oncology simulation suite under the care of the radiation oncology physician and physicist.  I participated closely in the radiation treatment planning afterwards. The patient underwent planning CT which was fused to 3T high resolution MRI with 1 mm axial slices.  These images were fused on the planning system.  We contoured the gross target volumes and subsequently expanded this to yield the Planning Target Volume. I actively participated in the planning process.  I helped to define and review the target contours and also the contours of the optic pathway, eyes, brainstem and selected nearby organs at risk.  All the dose constraints for critical structures were reviewed and compared to AAPM Task Group 101.  The prescription dose conformity was reviewed.  I approved the plan electronically.    Accordingly, Natalie Contreras  was brought to the TrueBeam stereotactic radiation treatment linac and placed in the custom immobilization mask.  The patient was aligned according to the IR fiducial markers with BrainLab Exactrac, then orthogonal x-rays were used in ExacTrac with the 6DOF robotic table and the shifts were made to align the patient  Natalie Contreras received stereotactic radiosurgery to a prescription dose of 20 Gy uneventfully to 11 small lesions, and underwent 9 Gy to her 3.3 cm L parietal lesion, the first of 3 fractions.    The detailed description of the procedure is recorded in the  radiation oncology procedure note.  I was present for the duration of the procedure.  DISPOSITION:   Following delivery, the patient was transported to nursing in stable condition and monitored for possible acute effects to be discharged to home in stable condition with follow-up in one month.  Duffy Rhody, MD [facility]

## 2021-10-29 NOTE — Progress Notes (Signed)
Discharge plans deemed unsafe by nursing staff, corroborated by PT (see note).  MD notified.  RN told by MD that patient would stay another night due to unsafe discharge circumstances.  Patient notified by RN of update, made inconsistent statements regarding her "home health" set up.  Patient does not seem aware of assistance she requires to complete ADL safely, continues to claim that she will "set up home health" once she gets home, and that her family members will be able to help her enough until then.  Patient reminded that she required 3-4 nursing staff to transfer.  Patient verbalized understanding.  Patient's daughter Julianne Rice at bedside, also expressed concern about patient's safety at home.  Angie Fava, RN

## 2021-10-29 NOTE — Progress Notes (Signed)
PT Cancellation Note  Patient Details Name: Natalie Contreras MRN: [id number] DOB: Dec 20, 1965   Cancelled Treatment:    Reason Eval/Treat Not Completed: Patient at procedure or test/unavailable. Will check back if schedule allows.   [place] [facility]  Office: [phone] Pager: [phone]

## 2021-10-29 NOTE — Progress Notes (Signed)
Inpatient Diabetes Program Recommendations  AACE/ADA: New Consensus Statement on Inpatient Glycemic Control (2015)  Target Ranges:  Prepandial:   less than 140 mg/dL      Peak postprandial:   less than 180 mg/dL (1-2 hours)      Critically ill patients:  140 - 180 mg/dL    Latest Reference Range & Units 10/28/21 08:13 10/28/21 12:11 10/28/21 17:01 10/28/21 20:21  Glucose-Capillary 70 - 99 mg/dL 209 (H)  10 units Novolog  214 (H)  10 units Novolog  201 (H)  10 units Novolog  295 (H)  3 units Novolog  15 units Levemir  (H): Data is abnormally high  Latest Reference Range & Units 10/29/21 07:37  Glucose-Capillary 70 - 99 mg/dL 205 (H)  (H): Data is abnormally high   Home DM Meds: Jardiance 25 mg QD      OmniPod Insulin Pump (not wearing now)   Current Orders: Novolog Moderate Correction Scale/ SSI (0-15 units) TID AC + HS                           Novolog 5 units TID with meals                           Levemir 15 units QHS     Decadron 4 mg Q6 hours   Omnipod insulin pump & Dexcom CGM (at home) Basal--0.9 units/hour (total daily basal 21.6 units) Insulin carb ratio--1 units: 10 carbs Correction-- 1 units drops blood sugar 40 mg/dL Target 140-150     MD- Please consider:  1. Increase Levemir to 18 units QHS (AM CBGs remain >200)  2. Increase Novolog Meal Coverage to 7 units TID with meals     --Will follow patient during hospitalization--  Wyn Quaker RN, MSN, CDE Diabetes Coordinator Inpatient Glycemic Control Team Team Pager: [phone] (8a-5p)

## 2021-10-29 NOTE — Progress Notes (Signed)
Occupational Therapy Treatment Patient Details Name: Natalie Contreras MRN: [id number] DOB: 1965-10-27 Today's Date: 10/29/2021   History of present illness 56 y.o. female with medical history significant of DM2 with neuropathy and PAD, R BKA, dCHF, HTN. 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 Pt started on radiation therapy during stay. Pt presented to ED 10/23/21 for evaluation of leg swelling and difficulty with walking.   OT comments  Patient noted to have poor insight to deficits and level of assistance needed at home. Patient indicated periods of alone time at home between roommate and daughter going to work. Patient was educated on importance of having someone present 24/7 for physical assist needed for transfersPatient was min A for supine to sit on edge of bed with increased time. Patient was able to complete UB bathing tasks with set up and mod A for LB bathing tasks EOB. Patient was able to weight shift but unable to maintain sitting balance to complete with patient transitioning to side-lying for weight shifting tasks. Patient would continue to benefit from skilled OT services at this time while admitted and after d/c to address noted deficits in order to improve overall safety and independence in ADLs.     Recommendations for follow up therapy are one component of a multi-disciplinary discharge planning process, led by the attending physician.  Recommendations may be updated based on patient status, additional functional criteria and insurance authorization.    Follow Up Recommendations  Skilled nursing-short term rehab (<3 hours/day)    Assistance Recommended at Discharge Frequent or constant Supervision/Assistance  Patient can return home with the following  A little help with bathing/dressing/bathroom;Assistance with cooking/housework;Assist for transportation;A little help with walking and/or  transfers   Equipment Recommendations  None recommended by OT    Recommendations for Other Services      Precautions / Restrictions Precautions Precautions: Fall Precaution Comments: R BKA with prosthesis Restrictions Weight Bearing Restrictions: No       Mobility Bed Mobility Overal bed mobility: Needs Assistance       Supine to sit: Min assist, HOB elevated     General bed mobility comments: with bed rails and increased time.    Transfers                         Balance Overall balance assessment: Needs assistance Sitting-balance support: No upper extremity supported Sitting balance-Leahy Scale: Good                                     ADL either performed or assessed with clinical judgement   ADL Overall ADL's : Needs assistance/impaired         Upper Body Bathing: Set up;Sitting Upper Body Bathing Details (indicate cue type and reason): EOB Lower Body Bathing: Moderate assistance;Cueing for compensatory techniques Lower Body Bathing Details (indicate cue type and reason): sitting on EOB with increased time Upper Body Dressing : Set up;Sitting     Lower Body Dressing Details (indicate cue type and reason): patient declined to attempt to don prostesis and shoe at this time. patient stated she does not want to stand. patient educated on bed being barrier to going home. patient verbalized understanding but continued to report decline to stand.  Extremity/Trunk Assessment              Vision       Perception     Praxis      Cognition Arousal/Alertness: Awake/alert Behavior During Therapy: WFL for tasks assessed/performed Overall Cognitive Status: Within Functional Limits for tasks assessed                                 General Comments: patient noted to have poor insight to deficits with patient reporting that she can go home today with some help from daughter and some from  roomate with periods of time alone. patient was educated on deficits and patient reported " i will make it work".        Exercises      Shoulder Instructions       General Comments      Pertinent Vitals/ Pain       Pain Assessment Pain Assessment: Faces Faces Pain Scale: Hurts little more Pain Location: back with sitting long sitting in bed. Pain Descriptors / Indicators: Discomfort, Grimacing Pain Intervention(s): Monitored during session, Repositioned  Home Living                                          Prior Functioning/Environment              Frequency  Min 2X/week        Progress Toward Goals  OT Goals(current goals can now be found in the care plan section)  Progress towards OT goals: Progressing toward goals     Plan Discharge plan remains appropriate;Frequency remains appropriate    Co-evaluation                 AM-PAC OT "6 Clicks" Daily Activity     Outcome Measure   Help from another person eating meals?: None Help from another person taking care of personal grooming?: A Little Help from another person toileting, which includes using toliet, bedpan, or urinal?: A Lot Help from another person bathing (including washing, rinsing, drying)?: A Lot Help from another person to put on and taking off regular upper body clothing?: A Little Help from another person to put on and taking off regular lower body clothing?: A Lot 6 Click Score: 16    End of Session        Activity Tolerance Patient tolerated treatment well   Patient Left with call bell/phone within reach;in bed;with bed alarm set;with nursing/sitter in room   Nurse Communication Mobility status (concers over d/c home)        Time: 9290-9030 OT Time Calculation (min): 25 min  Charges: OT General Charges $OT Visit: 1 Visit OT Treatments $Self Care/Home Management : 23-37 mins  Jackelyn Poling OTR/L, MS Acute Rehabilitation Department Office#  [phone] Pager# [phone]   Marcellina Millin 10/29/2021, 12:28 PM

## 2021-10-29 NOTE — Discharge Summary (Signed)
Physician Discharge Summary   Patient: Natalie Contreras MRN: [id number] DOB: [birth year]  Admit date:     10/23/2021  Discharge date: 10/29/21  Discharge Physician: Flora Lipps   PCP: Celene Squibb, MD   Recommendations at discharge:   Follow-up with your primary care physician in 1 week  continue radiation treatment as outpatient as per radiation oncology. Patient has been started on Decadron on discharge this will need to be reviewed as per radiation oncology.  Discharge Diagnoses: Principal Problem:   Ambulatory dysfunction Active Problems:   Metastatic cancer to brain ([place])   Benign essential HTN   Acute on chronic diastolic CHF (congestive heart failure) (HCC)   Acute blood loss anemia   DM2 (diabetes mellitus, type 2) (HCC)   Renal cell carcinoma (HCC)   Hyperkalemia  Resolved Problems:   * No resolved hospital problems. *   Hospital Course: Natalie Contreras is a 56 y.o. 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  She was supposed to go for radiation oncology appointment but could not go so she decided to come to the hospital.  In the ED patient had a CT scan done which showed multiple intracranial metastasis some hemorrhagic and some with associated vasogenic edema which was progressive in nature.  Patient was then considered for admission to hospital for further evaluation treatment and assessment.  During hospitalization, patient was seen by radiation oncology and underwent simulation for radiation treatment on 10/24/2021.  Initial recommendation was skilled nursing facility placement but she needs now for disposition home  with home health and outpatient radiation treatment.  Assessment and Plan: * Ambulatory dysfunction- (present on admission) Patient is right below-knee amputee uses prosthesis at baseline  been having difficulty ambulating recently.  CT scan showed hemorrhagic metastasis with vasogenic edema.    Physical therapy recommendws skilled nursing facility on discharge but no bed offers yet.  Plan for home with home health with outpatient radiation treatment regimen.  Metastatic cancer to brain [facility])- (present on admission) CT head scan showed worsened / increased appearance of lesions on CT possibly due to increased hemorrhage / vasogenic edema.  Patient has been started on IV Decadron.  This will be transition to p.o. Decadron on discharge.  Communicated with radiation oncology about it.  We will continue PPI and sucralfate when on Decadron.  Patient will get the radiation treatment today.  Hyperkalemia Resolved.  Latest potassium of 4.7.  Renal cell carcinoma ([place])- (present on admission) With brain and gastric metastasis.  Radiation treatment as per radiation oncology.    DM2 (diabetes mellitus, type 2) ([place]) With hyperglycemia on presentation..  Mild acidosis noted on admission so Jardiance on hold.  Patient is on insulin pump at home.  Will be on  Decadron on discharge.  Acute blood loss anemia- (present on admission) Hemoglobin was 7.7 on last discharge.  Recent hemoglobin at 8.4..  Patient does have history of GI bleed from fungating ulcer in the stomach which was confirmed to be metastatic renal cell carcinoma.  Continue PPI sucralfate while on dexamethasone.  Hemoglobin has however remained stable during hospitalization.  Acute on chronic diastolic CHF (congestive heart failure) ([place])- (present on admission) Patient with increased peripheral edema from baseline.  Initially received IV  Lasix.  This has been changed to oral Lasix at this time..  Creatinine at 1.0 from 1.4<1.4<1.3< 1.3.  2D  echocardiogram from 10/25/2021 showed LV ejection fraction of 55 to 60% with grade 3 diastolic dysfunction. Edema has improved.  We will continue on discharge.  Benign essential HTN- (present on admission) Patient's dose of Coreg has been increased to 6.25 twice daily.  Oral hydralazine has been initiated in the hospital which will be continued on discharge   Consultants: Radiation oncology Procedures performed: Radiation treatment Disposition: Home health Diet recommendation:  Discharge Diet Orders (From admission, onward)     Start     Ordered   10/29/21 0000  Diet - low sodium heart healthy        10/29/21 1142           Carb modified diet  DISCHARGE MEDICATION: Allergies as of 10/29/2021   No Known Allergies      Medication List     TAKE these medications    acetaminophen 325 MG tablet Commonly known as: TYLENOL Take 1-2 tablets (325-650 mg total) by mouth every 4 (four) hours as needed for mild pain.   carvedilol 6.25 MG tablet Commonly known as: COREG Take 1 tablet (6.25 mg total) by mouth 2 (two) times daily with a meal. What changed:  medication strength See the new instructions.   dexamethasone 4 MG tablet Commonly known as: DECADRON Take 1 tablet (4 mg total) by mouth 3 (three) times daily for 10 days.   Dexcom G6 Sensor Misc Inject 1 Device into the skin See admin instructions. Place 1 device/sensor into the skin every 10 days, after removing the former one   docusate sodium 100 MG capsule Commonly known as: COLACE Take 1 capsule (100 mg total) by mouth 2 (two) times daily. What changed:  when to take this reasons to take this   ergocalciferol 1.25 MG (50000 UT) capsule Commonly known as: VITAMIN D2 Take 50,000 Units by mouth every Sunday.   furosemide 40 MG tablet Commonly known as: LASIX Take 1 tablet (40 mg total) by mouth daily.   hydrALAZINE 25 MG tablet Commonly known as: APRESOLINE Take 1 tablet (25 mg total) by mouth every 8 (eight)  hours.   hydrocortisone 25 MG suppository Commonly known as: ANUSOL-HC Place 1 suppository (25 mg total) rectally 2 (two) times daily.   insulin aspart 100 UNIT/ML injection Commonly known as: novoLOG Inject 100 Units into the skin See admin instructions. Up to 100 units per day in omni pod. Omnipod to be filled with Novolog to be accurately injected by Omnipod based on settings.   Jardiance 25 MG Tabs tablet Generic drug: empagliflozin Take 25 mg by mouth daily.   multivitamin with minerals Tabs tablet Take 1 tablet by mouth daily.   Omnipod DASH Pods (Gen 4) Misc Inject into the skin continuous.   ondansetron 4 MG tablet Commonly known as: ZOFRAN Take 1 tablet (4 mg total) by mouth every 6 (six) hours as needed for nausea.   oxyCODONE-acetaminophen 5-325 MG tablet Commonly known as: PERCOCET/ROXICET Take 1 tablet by mouth every 6 (six) hours as needed for moderate pain or severe pain.   pantoprazole 40 MG tablet Commonly known as: PROTONIX Take 1 tablet (40 mg total) by mouth 2 (two) times daily.   polyethylene glycol 17 g packet Commonly known as: MIRALAX / GLYCOLAX Take 17 g by mouth 2 (two) times daily. What changed:  when to take this reasons to take this   simvastatin  40 MG tablet Commonly known as: ZOCOR Take 40 mg by mouth at bedtime.   sucralfate 1 GM/10ML suspension Commonly known as: CARAFATE Take 10 mLs (1 g total) by mouth 4 (four) times daily -  with meals and at bedtime.        Follow-up Information     Celene Squibb, MD Follow up in 1 week(s).   Specialty: Internal Medicine Contact information: [place] [address] [phone]                Subjective Today, patient was seen and examined at bedside.  Denies interval complaints.  Denies any fever, chills shortness of breath nausea or vomiting.  Denies any headache.  Discharge Exam: Vitals with BMI 10/29/2021 10/29/2021 10/28/2021  Height - - -  Weight - - -   BMI - - -  Systolic [phone]  Diastolic 63 57 71  Pulse 71 68 72    Filed Weights   10/25/21 0623 10/26/21 0451  Weight: 93.3 kg 95.1 kg   General: Obese built, not in obvious distress HENT:   No scleral pallor or icterus noted. Oral mucosa is moist.  Chest:  Clear breath sounds.  Diminished breath sounds bilaterally. No crackles or wheezes.  CVS: S1 &S2 heard. No murmur.  Regular rate and rhythm. Abdomen: Soft, nontender, nondistended.  Bowel sounds are heard.   Extremities: No cyanosis, clubbing with right below-knee amputation, left lower extremity trace edema,  Psych: Alert, awake and oriented, normal mood CNS:  No cranial nerve deficits.  Power equal in all extremities.   Skin: Warm and dry.  No rashes noted.   Condition at discharge: good  The results of significant diagnostics from this hospitalization (including imaging, microbiology, ancillary and laboratory) are listed below for reference.   Imaging Studies: DG Chest 2 View  Result Date: 10/13/2021 CLINICAL DATA:  smoker. weakness. rule out infection/malignancy EXAM: CHEST - 2 VIEW COMPARISON:  April 07, 2017 FINDINGS: The cardiomediastinal silhouette is enlarged in contour.Atherosclerotic calcifications. No pleural effusion. No pneumothorax. Multiple nodular opacities throughout bilateral lungs. Surgical clips project over the upper abdomen. Suggestion of sclerosis of several vertebral bodies. IMPRESSION: 1. Multiple nodular opacities bilaterally. Suggestion of multifocal sclerosis of the vertebral bodies versus summation artifact. Findings are concerning for multifocal metastases. Recommend further dedicated evaluation with cross-sectional imaging. Electronically Signed   By: Valentino Saxon M.D.   On: 10/13/2021 15:29   CT Head Wo Contrast  Result Date: 10/24/2021 CLINICAL DATA:  Brain/CNS neoplasm to assess treatment response. EXAM: CT HEAD WITHOUT CONTRAST TECHNIQUE: Contiguous axial images were obtained from the  base of the skull through the vertex without intravenous contrast. RADIATION DOSE REDUCTION: This exam was performed according to the departmental dose-optimization program which includes automated exposure control, adjustment of the mA and/or kV according to patient size and/or use of iterative reconstruction technique. COMPARISON:  MRI brain 10/19/2021.  CT head 10/13/2021 FINDINGS: Brain: Multiple intracranial metastases are again demonstrated. Largest is a heterogeneous mixed density but mostly hyperdense lesion in the left parietal lobe measuring 3 x 3.7 cm diameter. Size measures larger than on previous CT. There is surrounding vasogenic edema as before. Multiple additional mostly hyperdense lesions are also demonstrated. Lesions are more numerous and larger than on the previous CT but were demonstrated on the prior MRI. Hyperdensity in the lesions suggest hemorrhagic component. No mass effect or midline shift. No abnormal extra-axial fluid collections. Ventricles are not dilated. Vascular: Intracranial arterial vascular calcifications  are present. Skull: Normal. Negative for fracture or focal lesion. Sinuses/Orbits: Paranasal sinuses and mastoid air cells are clear. Other: None. IMPRESSION: Multiple intracranial metastases, some hemorrhagic, and associated vasogenic white matter edema demonstrating progression since prior CT, possibly due to intervening hemorrhage within the lesions. Size and distribution of the lesions is similar to the interval MRI. Greater than 10 lesions are present. No acute mass effect. Electronically Signed   By: Lucienne Capers M.D.   On: 10/24/2021 00:32   CT Head Wo Contrast  Result Date: 10/13/2021 CLINICAL DATA:  dizziness. blurred vision. EXAM: CT HEAD WITHOUT CONTRAST TECHNIQUE: Contiguous axial images were obtained from the base of the skull through the vertex without intravenous contrast. RADIATION DOSE REDUCTION: This exam was performed according to the departmental  dose-optimization program which includes automated exposure control, adjustment of the mA and/or kV according to patient size and/or use of iterative reconstruction technique. COMPARISON:  February 17, 2017. FINDINGS: Brain: There is a lesion in the LEFT parieto-occipital lobe with adjacent vasogenic edema and a peripheral rim of intrinsic hyperdensity. It measures 2.6 x 2.4 by 2.7 cm. There is an additional intrinsically hyperdense region along the RIGHT frontoparietal falx which measures 7 by 5 mm which is concerning for an additional site of disease (series 2, image 23). Hyperdensity of the LEFT frontal lobe measuring approximately 5 mm likely reflects an additional site of disease (series 2, image 25). Hypodensity of the RIGHT occipital lobe with a small amount of intrinsic hyperdensity likely reflecting additional lesion (series 2, image 12; series 5, image 28). Hypodensity of the LEFT inferior cerebellum, consistent with the sequela of remote prior infarction and similar comparison to prior. No significant midline shift. Vascular: Vascular calcifications of the carotid siphons. Skull: No acute fracture. Sinuses/Orbits: LEFT mastoid effusion. Other: None. IMPRESSION: 1. There is a 2.7 cm peripherally hyperdense masslike area in the LEFT parieto-occipital lobe with adjacent vasogenic edema. 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 Recommend further evaluation with dedicated brain MRI with and without contrast. 2. There are likely additional lesions in the bilateral cerebral hemispheres. These results were called by telephone at the time of interpretation on 10/13/2021 at 3:20 pm to provider MARGAUX VENTER , who verbally acknowledged these results. Electronically Signed   By: Valentino Saxon M.D.   On: 10/13/2021 15:28   MR Brain W Wo Contrast  Result Date: 10/20/2021 CLINICAL DATA:  Brain metastases, unknown primary 3T SRS Protocol for treatment planning EXAM: MRI HEAD WITHOUT AND WITH CONTRAST TECHNIQUE:  Multiplanar, multiecho pulse sequences of the brain and surrounding structures were obtained without and with intravenous contrast. CONTRAST:  51mL GADAVIST GADOBUTROL 1 MMOL/ML IV SOLN COMPARISON:  10/15/2021 FINDINGS: Brain: Multiple parenchymal masses are again identified. Possible dural lesion along the falx is also again noted. In the interval, there has been evolution of hemorrhage associated with the lesions that now demonstrate significant intrinsic T1 shortening including within the surrounding area of edema. Therefore, evaluation for enhancement is now limited. Greater than 10 lesions are present. A few were not visible on the prior study or are new. For example, along the body of the caudate on the right (series 1100, image 178) and several punctate lesions in the right cerebral hemisphere (images 178, 181, 192, 197, and 202). Many of these lesions demonstrate corresponding diffusion hyperintensity. There also additional foci of diffusion hyperintensity in the frontoparietal white matter bilaterally without definite corresponding enhancement. This was also present on the prior study. The right caudate body lesion was visible on  SWI on the prior study. There is trace subdural hemorrhage along the cerebral convexities posteriorly. Likely minimal sulcal subarachnoid hemorrhage associated with the dominant left parieto-occipital lesion. No significant change in mass effect. Vascular: Major vessel flow voids at the skull base are preserved. Skull and upper cervical spine: Normal marrow signal is preserved. Sinuses/Orbits: Paranasal sinuses are aerated. Orbits are unremarkable. Other: Sella is unremarkable.  Mastoid air cells are clear. IMPRESSION: Multiple (greater than 10) hemorrhagic lesions are present with evolution of intralesional hemorrhage in the interval. There is T1 hyperintensity of the perilesional edema, a finding more commonly associated with cavernous malformations, but these are presumed to  reflect metastases in the setting of renal malignancy. A few of the lesions were not visible on the prior study or are new. Many of these demonstrate corresponding diffusion hyperintensity. Therefore, subacute infarcts are consideration for this subset. Electronically Signed   By: Macy Mis M.D.   On: 10/20/2021 14:05   MR BRAIN W WO CONTRAST  Result Date: 10/16/2021 CLINICAL DATA:  CNS neoplasm, [facility] protocol EXAM: MRI HEAD WITHOUT AND WITH CONTRAST TECHNIQUE: Multiplanar, multiecho pulse sequences of the brain and surrounding structures were obtained without and with intravenous contrast. CONTRAST:  80mL GADAVIST GADOBUTROL 1 MMOL/ML IV SOLN COMPARISON:  No prior MRI, correlation is made with 10/13/2021 CT head FINDINGS: Evaluation is limited by motion artifact. Brain: Multiple enhancing masses in the bilateral cerebral hemispheres, the largest of which is in the left parietooccipital lobe and measures up to 3.3 x 2.9 x 3.3 cm (AP x TR x CC) (series 9, image 92). Additional smaller lesions include a left parietal lesion that measures 0.7 x 0.5 x 0.7 cm (series 9, image 39 and series 10, image 29), a left frontal lobe lesion that measures 0.6 x 0.4 x 0.6 cm (series 9, image 139 and series 10, image 43), a right frontal lobe lesion that measures 0.3 x 0.4 x 0.4 cm (series 9, image 50 and series 10, image 52), a right extra-axial lesion along the falx adjacent to the right parietal lobe that measures 1.0 x 0.5 x 1.0 cm (series 9, image 60 and series 10, image 23), right parietal lesion that measures 1.0 x 0.9 x 1.0 cm (series 9, image 80 and series 10, image 20), a left parietal lesion that measures 0.4 x 0.8 x 0.5 cm (series 9, image 82 and series 10, image 22), a left occipital lesion that measures 0.5 x 0.8 x 0.6 cm (series 9, image 124 and series 10, image 8), and a right occipital lesion (or possibly 2 adjacent lesions) that measures 1.2 x 1.7 x 0.9 cm (series 9, image 128 and series 10, image 8).  Possible additional lesions in the right frontal lobe adjacent to the right lateral ventricle, which is faintly visualized on the coronal postcontrast sequences (series 10, image 32), measuring 0.6 x 0.5 cm, and in the atrium of the right lateral ventricle (series 11, image 26), measuring approximately 0.4 cm, best seen on the sagittal sequence. These lesions are associated with surrounding T2 hyperintense signal, likely edema, with the largest area of edema surrounding the dominant left parieto-occipital lesion. Several of these lesions are also associated with hemosiderin deposition (series 6, image 54, 76, 81, and 82). Two rounded areas of hemosiderin deposition in the left lateral ventricle atrium (series 6, image 76 and 69) are difficult to find definite enhancing lesions to correlate with on the axial sequences, given enhancement in the choroid plexus, but possible correlates are suspected  on the sagittal postcontrast sequence (series 11, image 24 and 26). No other areas of parenchymal hemorrhage. No definite midline shift. 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 No hydrocephalus or extra-axial collection. No diffusion-weighted sequence was available to evaluate for infarct given the selected protocol. Vascular: The left PCA flow void is not well visualized, however the vessel is seen on postcontrast imaging. Otherwise normal flow voids. Skull and upper cervical spine: Normal marrow signal. Sinuses/Orbits: Status post bilateral lens replacements. Otherwise negative. Other: Trace fluid in the mastoid air cells. IMPRESSION: Evaluation is limited by motion artifact. Within this limitation, multiple enhancing lesions in the bilateral cerebral hemispheres, with additional extra-axial lesion along the right parietal lobe and possible lesions in the atrium of the left lateral ventricle. Several of these lesions demonstrate associated edema and intralesional hemorrhage, including the  dominant left parieto-occipital lesion. These are overall concerning for metastatic disease. Additional lesions may be present, but obscured by the motion artifact. Electronically Signed   By: Merilyn Baba M.D.   On: 10/16/2021 02:05   CT CHEST ABDOMEN PELVIS W CONTRAST  Result Date: 10/13/2021 CLINICAL DATA:  Brain metastasis of unknown primary. EXAM: CT CHEST, ABDOMEN, AND PELVIS WITH CONTRAST TECHNIQUE: Multidetector CT imaging of the chest, abdomen and pelvis was performed following the standard protocol during bolus administration of intravenous contrast. RADIATION DOSE REDUCTION: This exam was performed according to the departmental dose-optimization program which includes automated exposure control, adjustment of the mA and/or kV according to patient size and/or use of iterative reconstruction technique. CONTRAST:  53mL OMNIPAQUE IOHEXOL 300 MG/ML  SOLN COMPARISON:  None. FINDINGS: CT CHEST FINDINGS Cardiovascular: Mild cardiomegaly. Aortic and coronary atherosclerotic calcification noted. Mediastinum/Lymph Nodes: Mild bilateral hilar lymphadenopathy is seen. No pathologically enlarged mediastinal or axillary lymph nodes are identified. Lungs/Pleura: Multiple solid bilateral pulmonary nodules are seen, consistent with diffuse pulmonary metastases. Index nodule in the medial right lower lobe measures 2.9 x 1.7 cm on image 92/3. Index nodule in the posterolateral left lower lobe measures 4.4 x 2.2 cm on image 109/3. Small left pleural effusion also seen. Musculoskeletal: Multiple chest wall soft tissue masses are seen bilaterally, involving the breasts, chest wall muscles and subcutaneous tissues. The 2 largest masses involve the right pectoralis major muscle, measuring 3.3 x 2.9 cm on image 12/2, and the right supraspinatus muscle measuring 5.7 x 3.2 cm on image 21/2. CT ABDOMEN AND PELVIS FINDINGS Hepatobiliary: Multiple hypovascular masses are seen throughout the right and left hepatic lobes, largest in  the lateral dome of the right hepatic lobe measuring 10.7 x 5.1 cm. These are consistent diffuse liver metastases. Prior cholecystectomy. No evidence of biliary obstruction. Pancreas:  No mass or inflammatory changes. Spleen:  Within normal limits in size and appearance. Adrenals/Urinary tract: A small right adrenal mass is seen measuring 2.0 x 1.5 cm, which is indeterminate but suspicious for adrenal metastasis. A large heterogeneously enhancing mass is seen involving the posterior left kidney, which shows invasion of the posterior abdominal wall soft tissues. This measures 10.3 x 8.9 cm, and is consistent with primary renal cell carcinoma. Smaller enhancing soft tissue nodules are also seen in the left posterior pararenal space, consistent with metastatic disease. Two small masses are seen along the capsular surface of the anterior mid and lower poles of the right kidney, which measure 2.2 cm and 1.5 cm in diameter. These could represent synchronous primary renal cell carcinomas of the right kidney, or metastatic disease. Two soft tissue nodules are involve the  lateral portion of Gerota's fascia on the right, largest measuring 2.2 x 1.6 cm. A subcapsular cyst measuring 4 cm is also seen in the lateral interpolar region of the right kidney. Stomach/Bowel: No evidence of obstruction, inflammatory process, or abnormal fluid collections. Vascular/Lymphatic: Mild lymphadenopathy is seen in the left paraaortic region, largest measuring 10 mm on image 67/2. A 1.3 cm lymph node is seen in the deep left abdominal small bowel mesentery adjacent to the duodenum. No pelvic lymphadenopathy identified. Aortic atherosclerotic calcification noted. Left retroaortic renal vein is noted, however there is no evidence of renal vein or IVC thrombus. Reproductive: No masses identified. Small amount of gas noted within the endometrial cavity uterus. Other:  None. Musculoskeletal: No suspicious bone lesions identified. Multiple small  masses are seen scattered throughout the abdominal wall soft tissues, consistent with metastatic disease. IMPRESSION: 10 cm left renal mass with invasion of the left posterior abdominal wall soft tissues, consistent with primary renal cell carcinoma. Adjacent metastatic soft tissue nodules also noted within the left perinephric space. Diffuse bilateral pulmonary metastases. Two small masses along the capsular surface of the right kidney, which may represent synchronous primary renal cell carcinomas or metastatic disease. Metastatic disease also involving Gerota's fascia on the right. Mild abdominal lymphadenopathy, consistent with metastatic disease. Diffuse liver metastases. Small right adrenal mass, suspicious for metastatic disease. Diffuse bilateral pulmonary metastases. 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 Electronically Signed   By: Marlaine Hind M.D.   On: 10/13/2021 17:53   ECHOCARDIOGRAM COMPLETE  Result Date: 10/25/2021    ECHOCARDIOGRAM REPORT   Patient Name:   Natalie Contreras Date of Exam: 10/25/2021 Medical Rec #:  [id number]       Height:       64.0 in Accession #:    [id number]      Weight:       205.7 lb Date of Birth:  04-13-1966      BSA:          1.980 m Patient Age:    19 years        BP:           143/61 mmHg Patient Gender: F               HR:           77 bpm. Exam Location:  Inpatient Procedure: 2D Echo Indications:    Acute diastolic CHF  History:        Patient has prior history of Echocardiogram examinations, most                 recent 10/29/2016. CHF; Risk Factors:Diabetes and Hypertension.  Sonographer:    Arlyss Gandy Referring Phys: [id number] [place]  1. Left ventricular ejection fraction, by estimation, is 55 to 60%. The left ventricle has normal function. The left ventricle has no regional wall motion abnormalities. Left ventricular diastolic parameters are consistent with Grade III diastolic dysfunction  (restrictive). Elevated left ventricular end-diastolic pressure.  2. Right ventricular systolic function is normal. The right ventricular size is mildly enlarged. There is severely elevated pulmonary artery systolic pressure. The estimated right ventricular systolic pressure is 03.5 mmHg.  3. Left atrial size was moderately dilated.  4. Right atrial size was mildly dilated.  5. The mitral valve is grossly normal. Mild to moderate mitral valve regurgitation.  6. Tricuspid valve regurgitation is mild to moderate.  7. The aortic valve is tricuspid. There is mild  calcification of the aortic valve. There is mild thickening of the aortic valve. Aortic valve regurgitation is not visualized. Aortic valve sclerosis/calcification is present, without any evidence of aortic stenosis.  8. Aortic aortic arch not well visualized.  9. The inferior vena cava is dilated in size with <50% respiratory variability, suggesting right atrial pressure of 15 mmHg. Comparison(s): Changes from prior study are noted. Severely elevated pulmonary pressures on current study. FINDINGS  Left Ventricle: Left ventricular ejection fraction, by estimation, is 55 to 60%. The left ventricle has normal function. The left ventricle has no regional wall motion abnormalities. The left ventricular internal cavity size was normal in size. There is  borderline left ventricular hypertrophy. Left ventricular diastolic parameters are consistent with Grade III diastolic dysfunction (restrictive). Elevated left ventricular end-diastolic pressure. Right Ventricle: The right ventricular size is mildly enlarged. Right vetricular wall thickness was not well visualized. Right ventricular systolic function is normal. There is severely elevated pulmonary artery systolic pressure. The tricuspid regurgitant velocity is 3.64 m/s, and with an assumed right atrial pressure of 15 mmHg, the estimated right ventricular systolic pressure is 38.7 mmHg. Left Atrium: Left atrial size  was moderately dilated. Right Atrium: Right atrial size was mildly dilated. Pericardium: There is no evidence of pericardial effusion. Mitral Valve: The mitral valve is grossly normal. There is mild thickening of the mitral valve leaflet(s). There is mild calcification of the mitral valve leaflet(s). Mild to moderate mitral annular calcification. Mild to moderate mitral valve regurgitation. MV peak gradient, 11.0 mmHg. The mean mitral valve gradient is 4.0 mmHg. Tricuspid Valve: The tricuspid valve is grossly normal. Tricuspid valve regurgitation is mild to moderate. No evidence of tricuspid stenosis. Aortic Valve: The aortic valve is tricuspid. There is mild calcification of the aortic valve. There is mild thickening of the aortic valve. Aortic valve regurgitation is not visualized. Aortic valve sclerosis/calcification is present, without any evidence of aortic stenosis. Aortic valve mean gradient measures 9.5 mmHg. Aortic valve peak gradient measures 16.6 mmHg. Aortic valve area, by VTI measures 1.50 cm. Pulmonic Valve: The pulmonic valve was not well visualized. Pulmonic valve regurgitation is not visualized. No evidence of pulmonic stenosis. Aorta: The aortic root, ascending aorta, aortic arch and descending aorta are all structurally normal, with no evidence of dilitation or obstruction and aortic arch not well visualized. Venous: The inferior vena cava is dilated in size with less than 50% respiratory variability, suggesting right atrial pressure of 15 mmHg. IAS/Shunts: The atrial septum is grossly normal.  LEFT VENTRICLE PLAX 2D LVIDd:         5.10 cm   Diastology LVIDs:         3.50 cm   LV e' medial:    6.53 cm/s LV PW:         1.10 cm   LV E/e' medial:  23.9 LV IVS:        1.10 cm   LV e' lateral:   8.59 cm/s LVOT diam:     2.00 cm   LV E/e' lateral: 18.2 LV SV:         78 LV SV Index:   40 LVOT Area:     3.14 cm  RIGHT VENTRICLE             IVC RV Basal diam:  4.10 cm     IVC diam: 2.50 cm RV Mid diam:     3.20 cm RV S prime:     11.60 cm/s TAPSE (M-mode): 1.8 cm LEFT ATRIUM  Index        RIGHT ATRIUM           Index LA diam:        4.20 cm 2.12 cm/m   RA Area:     17.30 cm LA Vol (A2C):   76.3 ml 38.54 ml/m  RA Volume:   44.50 ml  22.48 ml/m LA Vol (A4C):   61.1 ml 30.86 ml/m LA Biplane Vol: 68.5 ml 34.60 ml/m  AORTIC VALVE AV Area (Vmax):    1.51 cm AV Area (Vmean):   1.47 cm AV Area (VTI):     1.50 cm AV Vmax:           204.00 cm/s AV Vmean:          144.000 cm/s AV VTI:            0.522 m AV Peak Grad:      16.6 mmHg AV Mean Grad:      9.5 mmHg LVOT Vmax:         97.90 cm/s LVOT Vmean:        67.500 cm/s LVOT VTI:          0.249 m LVOT/AV VTI ratio: 0.48  AORTA Ao Asc diam: 2.90 cm MITRAL VALVE                TRICUSPID VALVE MV Area (PHT): 3.42 cm     TR Peak grad:   53.0 mmHg MV Area VTI:   1.96 cm     TR Vmax:        364.00 cm/s MV Peak grad:  11.0 mmHg MV Mean grad:  4.0 mmHg     SHUNTS MV Vmax:       1.66 m/s     Systemic VTI:  0.25 m MV Vmean:      85.4 cm/s    Systemic Diam: 2.00 cm MV Decel Time: 222 msec MV E velocity: 156.00 cm/s MV A velocity: 71.00 cm/s MV E/A ratio:  2.20 Buford Dresser MD Electronically signed by Buford Dresser MD Signature Date/Time: 10/25/2021/8:05:43 PM    Final    [place] CORE BIOPSY (SOFT TISSUE)  Result Date: 10/17/2021 INDICATION: 56 year old female with history of left renal mass and multiple diffuse metastatic lesions. EXAM: Ultrasound-guided soft tissue mass biopsy MEDICATIONS: None. ANESTHESIA/SEDATION: Moderate (conscious) sedation was employed during this procedure. A total of Versed 1 mg and Fentanyl 25 mcg was administered intravenously. Moderate Sedation Time: 8 minutes. The patient's level of consciousness and vital signs were monitored continuously by radiology nursing throughout the procedure under my direct supervision. FLUOROSCOPY TIME:  None. COMPLICATIONS: None immediate. PROCEDURE: Informed written consent was obtained from the  patient after a thorough discussion of the procedural risks, benefits and alternatives. All questions were addressed. Maximal Sterile Barrier Technique was utilized including caps, mask, sterile gowns, sterile gloves, sterile drape, hand hygiene and skin antiseptic. A timeout was performed prior to the initiation of the procedure. Preprocedure ultrasound evaluation of the right posterior flank demonstrates subcutaneous hypoechoic soft tissue nodule measuring approximately 2.0 x 1.0 cm. Procedure was planned. Subdermal Local anesthesia was provided at the planned needle entry site. Deeper local anesthetic was administered under ultrasound guidance to the periphery of the targeted mass. A small skin nick was made. Under direct ultrasound visualization, a 17 gauge introducer needle was directed to the periphery of the mass. Next, a total of 3, 18 gauge core biopsies were obtained and placed in formalin. The introducer needle was removed. Postprocedure image  demonstrated no evidence of surrounding hematoma or other procedure related complication. The patient tolerated the procedure well and was transferred back to the floor in stable condition. IMPRESSION: Technically successful ultrasound-guided core needle biopsy of right posterior flank subcutaneous soft tissue mass. Ruthann Cancer, MD [facility] Electronically Signed   By: Ruthann Cancer M.D.   On: 10/17/2021 12:02   VAS [place] LOWER EXTREMITY VENOUS (DVT) (7a-7p)  Result Date: 10/24/2021  Lower Venous DVT Study Patient Name:  Natalie Contreras  Date of Exam:   10/23/2021 Medical Rec #: [id number]        Accession #:    [id number] Date of Birth: [birth year]       Patient Gender: F Patient Age:   52 years Exam Location:  [facility] Procedure:      VAS [place] LOWER EXTREMITY VENOUS (DVT) Referring Phys: Wille Glaser KNAPP --------------------------------------------------------------------------------  Indications:  Swelling of left lower extremity.  Limitations: Poor ultrasound/tissue interface. Comparison Study: 07-17-2017 Prior bilateral lower extremity venous was negative                   for DVT. Performing Technologist: Darlin Coco RDMS, RVT  Examination Guidelines: A complete evaluation includes B-mode imaging, spectral Doppler, color Doppler, and power Doppler as needed of all accessible portions of each vessel. Bilateral testing is considered an integral part of a complete examination. Limited examinations for reoccurring indications may be performed as noted. The reflux portion of the exam is performed with the patient in reverse Trendelenburg.  +-----+---------------+---------+-----------+----------+--------------+  RIGHT Compressibility Phasicity Spontaneity Properties Thrombus Aging  +-----+---------------+---------+-----------+----------+--------------+  CFV   Full            Yes       Yes                                    +-----+---------------+---------+-----------+----------+--------------+   +---------+---------------+---------+-----------+----------+-------------------+  LEFT      Compressibility Phasicity Spontaneity Properties Thrombus Aging       +---------+---------------+---------+-----------+----------+-------------------+  CFV       Full            Yes       Yes                                         +---------+---------------+---------+-----------+----------+-------------------+  SFJ       Full                                                                  +---------+---------------+---------+-----------+----------+-------------------+  FV Prox   Full                                                                  +---------+---------------+---------+-----------+----------+-------------------+  FV Mid    Full                                                                  +---------+---------------+---------+-----------+----------+-------------------+  FV Distal Full                                                                   +---------+---------------+---------+-----------+----------+-------------------+  PFV       Full                                                                  +---------+---------------+---------+-----------+----------+-------------------+  POP       Full            Yes       Yes                                         +---------+---------------+---------+-----------+----------+-------------------+  PTV                                                        Not well visualized  +---------+---------------+---------+-----------+----------+-------------------+  PERO                                                       Not well visualized  +---------+---------------+---------+-----------+----------+-------------------+    Summary: RIGHT: - No evidence of common femoral vein obstruction.  LEFT: - There is no evidence of deep vein thrombosis in the lower extremity. However, portions of this examination were limited- see technologist comments above.  - No cystic structure found in the popliteal fossa.  *See table(s) above for measurements and observations. Electronically signed by Harold Barban MD on 10/24/2021 at 8:53:54 PM.    Final     Microbiology: Results for orders placed or performed during the hospital encounter of 10/23/21  Resp Panel by RT-PCR (Flu A&B, Covid) Nasopharyngeal Swab     Status: None   Collection Time: 10/24/21  7:31 AM   Specimen: Nasopharyngeal Swab; Nasopharyngeal(NP) swabs in vial transport medium  Result Value Ref Range Status   SARS Coronavirus 2 by RT PCR NEGATIVE NEGATIVE Final    Comment: (NOTE) SARS-CoV-2 target nucleic acids are NOT DETECTED.  The SARS-CoV-2 RNA is generally detectable in upper respiratory specimens during the acute phase of infection. The lowest concentration of SARS-CoV-2 viral copies this assay can detect is 138 copies/mL. A negative result does not preclude SARS-Cov-2 infection and should not be used as the sole  basis for treatment or other patient management decisions. A negative result may occur with  improper specimen collection/handling, submission of specimen other than nasopharyngeal swab, presence of viral mutation(s) within the areas targeted by this assay, and inadequate number of viral copies(<138 copies/mL). A negative result must be combined with clinical observations, patient history, and epidemiological information. The expected result is Negative.  Fact Sheet for Patients:  EntrepreneurPulse.com.au  Fact Sheet for Healthcare Providers:  IncredibleEmployment.be  This test is no t yet approved or cleared by the [place] FDA and  has been authorized for detection and/or diagnosis of SARS-CoV-2 by FDA under an Emergency Use Authorization (EUA). This EUA will remain  in effect (meaning this test can be used) for the duration of the COVID-19 declaration under Section 564(b)(1) of the Act, 21 U.S.C.section 360bbb-3(b)(1), unless the authorization is terminated  or revoked sooner.       Influenza A by PCR NEGATIVE NEGATIVE Final   Influenza B by PCR NEGATIVE NEGATIVE Final    Comment: (NOTE) The Xpert Xpress SARS-CoV-2/FLU/RSV plus assay is intended as an aid in the diagnosis of influenza from Nasopharyngeal swab specimens and should not be used as a sole basis for treatment. Nasal washings and aspirates are unacceptable for Xpert Xpress SARS-CoV-2/FLU/RSV testing.  Fact Sheet for Patients: EntrepreneurPulse.com.au  Fact Sheet for Healthcare Providers: IncredibleEmployment.be  This test is not yet approved or cleared by the [place] FDA and has been authorized for detection and/or diagnosis of SARS-CoV-2 by FDA under an Emergency Use Authorization (EUA). This EUA will remain in effect (meaning this test can be used) for the duration of the COVID-19 declaration under Section 564(b)(1) of the Act,  21 U.S.C. section 360bbb-3(b)(1), unless the authorization is terminated or revoked.  Performed at [facility], [address] [place], [place] [address]     Labs: CBC: Recent Labs  Lab 10/23/21 1320 10/24/21 0431 10/25/21 0422 10/26/21 0437 10/27/21 0439 10/28/21 0714  WBC 5.7 5.7 6.8 7.3 5.9 6.7  NEUTROABS 4.0  --   --   --   --   --   HGB 8.6* 7.9* 7.7* 7.8* 8.0* 8.4*  HCT 30.1* 27.2* 26.4* 26.3* 27.9* 28.8*  MCV 85.3 82.7 81.7 82.2 83.3 82.5  PLT 394 387 344 319 328 297   Basic Metabolic Panel: Recent Labs  Lab 10/25/21 0422 10/26/21 0437 10/27/21 0439 10/28/21 0443 10/29/21 0422  NA 134* 138 138 137 137  K 3.9 4.5 4.7 5.5* 4.7  CL 103 107 107 108 107  CO2 20* 22 22 20* 22  GLUCOSE 297* 254* 259* 208* 206*  BUN 36* 49* 63* 67* 72*  CREATININE 1.34* 1.43* 1.46* 1.43* 1.07*  CALCIUM 8.4* 8.5* 8.6* 8.5* 8.8*  MG 2.0 2.1  --   --   --    Liver Function Tests: Recent Labs  Lab 10/23/21 1320  AST 13*  ALT 14  ALKPHOS 140*  BILITOT 0.5  PROT 7.1  ALBUMIN 3.1*   CBG: Recent Labs  Lab 10/28/21 1211 10/28/21 1701 10/28/21 2021 10/29/21 0737 10/29/21 1125  GLUCAP 214* 201* 295* 205* 304*    Discharge time spent: greater than 30 minutes.  Signed: Flora Lipps, MD [facility] 10/29/2021

## 2021-10-29 NOTE — Progress Notes (Signed)
This RN with two other nursing staff assisted patient to bedside commode yesterday (Sunday 2/12).  Patient was able to transfer to [facility], but unable with help from 4 staff members to stand and return to bed.  Per patient, prosthesis "doesn't work," and was noticed to come off during transfer due to poor fit and failed locking mechanism.  Staff was able to lift patient to transfer back to bed, patient providing very little support.  Angie Fava, RN

## 2021-10-30 LAB — GLUCOSE, CAPILLARY
Glucose-Capillary: 316 mg/dL — ABNORMAL HIGH (ref 70–99)
Glucose-Capillary: 351 mg/dL — ABNORMAL HIGH (ref 70–99)
Glucose-Capillary: 161 mg/dL — ABNORMAL HIGH (ref 70–99)

## 2021-10-30 MED ORDER — HYDRALAZINE HCL 10 MG PO TABS
10.0000 mg | ORAL_TABLET | Freq: Three times a day (TID) | ORAL | Status: DC
  Administered 2021-10-30: 10 mg via ORAL
  Filled 2021-10-30: qty 1

## 2021-10-30 MED ORDER — INSULIN ASPART 100 UNIT/ML IJ SOLN
8.0000 [IU] | Freq: Three times a day (TID) | INTRAMUSCULAR | Status: DC
  Administered 2021-10-30: 8 [IU] via SUBCUTANEOUS

## 2021-10-30 MED ORDER — INSULIN DETEMIR 100 UNIT/ML ~~LOC~~ SOLN
20.0000 [IU] | Freq: Every day | SUBCUTANEOUS | Status: DC
  Filled 2021-10-30: qty 0.2

## 2021-10-30 MED ORDER — LOSARTAN POTASSIUM 50 MG PO TABS
50.0000 mg | ORAL_TABLET | Freq: Every day | ORAL | Status: DC
  Administered 2021-10-30: 50 mg via ORAL
  Filled 2021-10-30: qty 1

## 2021-10-30 NOTE — Progress Notes (Signed)
Attempted to call patient's daughter RE: wheelchair pick up, no answer, left VM to return call

## 2021-10-30 NOTE — Progress Notes (Addendum)
Physical Therapy Treatment Patient Details Name: Natalie Contreras MRN: [id number] DOB: Feb 13, 1966 Today's Date: 10/30/2021   History of Present Illness 56 y.o. female with medical history significant of DM2 with neuropathy and PAD, R BKA, dCHF, HTN. 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 Pt started on radiation therapy during stay. Pt presented to ED 10/23/21 for evaluation of leg swelling and difficulty with walking.    PT Comments    Pt agreeable to working with PT/OT on today. Plan for session was to try to work on getting prosthesis on properly to attempt steps. Unfortunately, unable to get prosthesis to lock in despite multiple attempts. Pt did stand x 3 but she required +2 assist. Attempted lateral scooting along edge of bed again on today-pt remains unable. Ordered shrinker through [employer] to see if that will assist any with edema in residual limb. However, pt does report that she has had issues with prosthesis fit even before this admission. I am still concerned about pt's ability to safely manage at home-TOC is aware. PT recommendation remains SNF. If pt returns home, recommend 24/7 supervision/assist.     Recommendations for follow up therapy are one component of a multi-disciplinary discharge planning process, led by the attending physician.  Recommendations may be updated based on patient status, additional functional criteria and insurance authorization.  Follow Up Recommendations  Skilled nursing-short term rehab (<3 hours/day)(no bed offers)     Assistance Recommended at Discharge Frequent or constant Supervision/Assistance  Patient can return home with the following A lot of help with walking and/or transfers;A lot of help with bathing/dressing/bathroom;Assistance with cooking/housework;Assist for transportation;Direct supervision/assist for medications management;Direct supervision/assist for  financial management;Help with stairs or ramp for entrance   Equipment Recommendations   (will likely need [facility]AR transport home)    Recommendations for Other Services       Precautions / Restrictions Precautions Precautions: Fall Precaution Comments: R BKA with prosthesis Restrictions Weight Bearing Restrictions: No     Mobility  Bed Mobility Overal bed mobility: Needs Assistance Bed Mobility: Supine to Sit, Sit to Supine     Supine to sit: Mod assist Sit to supine: Mod assist   General bed mobility comments: Increased time. Assist for trunk steadying and to scoot to EOB-utilized bedpad to assist. Assist also required to return to bed.    Transfers Overall transfer level: Needs assistance Equipment used: Rolling walker (2 wheels) Transfers: Sit to/from Stand Sit to Stand: Mod assist, +2 physical assistance, +2 safety/equipment, From elevated surface           General transfer comment: x 3-unable to get prosthesis to lock in despite multiple attempts. Pt stood for ~15 seconds each time. +2 to power up, stabilize, control descent. Pt did report that she had issues with prosthesis prior to this admission-"needs adjusting."    Ambulation/Gait                   Stairs             Wheelchair Mobility    Modified Rankin (Stroke Patients Only)       Balance Overall balance assessment: Needs assistance Sitting-balance support: Feet supported, Bilateral upper extremity supported Sitting balance-Leahy Scale: Fair  Cognition Arousal/Alertness: Awake/alert Behavior During Therapy: WFL for tasks assessed/performed Overall Cognitive Status: No family/caregiver present to determine baseline cognitive functioning                                 General Comments: poor insight. pt gives conflicting information regarding assistance available, prior performance, plan for managing at home. Some  processing issues at times.         Exercises      General Comments        Pertinent Vitals/Pain Pain Assessment Pain Assessment: Faces Faces Pain Scale: No hurt    Home Living                          Prior Function            PT Goals (current goals can now be found in the care plan section) Progress towards PT goals: Not progressing toward goals - comment (continues to require +2 assist; unable to use prosthesis which is a necessity for pt to be able to mobilize)    Frequency    Min 3X/week      PT Plan Current plan remains appropriate    Co-evaluation              AM-PAC PT "6 Clicks" Mobility   Outcome Measure  Help needed turning from your back to your side while in a flat bed without using bedrails?: A Little Help needed moving from lying on your back to sitting on the side of a flat bed without using bedrails?: A Lot Help needed moving to and from a bed to a chair (including a wheelchair)?: Total Help needed standing up from a chair using your arms (e.g., wheelchair or bedside chair)?: Total Help needed to walk in hospital room?: Total Help needed climbing 3-5 steps with a railing? : Total 6 Click Score: 9    End of Session Equipment Utilized During Treatment: Gait belt Activity Tolerance: Patient limited by fatigue Patient left: in bed;with call bell/phone within reach;with bed alarm set   PT Visit Diagnosis: Other abnormalities of gait and mobility (R26.89);Muscle weakness (generalized) (M62.81)     Time: 4081-4481 PT Time Calculation (min) (ACUTE ONLY): 38 min  Charges:  $Therapeutic Activity: 8-22 mins                        Doreatha Massed, PT Acute Rehabilitation  Office: [phone] Pager: [phone]

## 2021-10-30 NOTE — TOC Transition Note (Signed)
Transition of Care [facility]) - CM/SW Discharge Note   Patient Details  Name: Natalie Contreras MRN: [id number] Date of Birth: May 13, 1966  Transition of [facility] CM/SW Contact:  Dessa Phi, RN Phone Number: 10/30/2021, 1:36 PM   Clinical Narrative:  see all prior notes;Patient did not d/c yesterday-concerns about safety-CM has already provided all resources, & services that we could-patient had no bed offers for SNF-she was in agreement to home;No [place] agency to accept;she states she already able to manage @ home & she knows her surroundings-home-patient has contacted [place] for personal care services. Transportation has already been set up for xrt;No further CM needs. [place] transport for home-patient has w/c-w/c transport via [phone].     Final next level of care: Home/Self Care Barriers to Discharge: No Barriers Identified   Patient Goals and CMS Choice Patient states their goals for this hospitalization and ongoing recovery are:: home CMS Medicare.gov Compare Post Acute Care list provided to:: Patient Choice offered to / list presented to : Patient  Discharge Placement                       Discharge Plan and Services   Discharge Planning Services: CM Consult Post Acute Care Choice: NA (Patient will f/u custodial level services)                    HH Arranged:  (No [place] agency to accept insurance)          Social Determinants of Health (SDOH) Interventions     Readmission Risk Interventions Readmission Risk Prevention Plan 10/18/2021 10/17/2021  Transportation Screening - Complete  PCP or Specialist Appt within 3-5 Days - Complete  HRI or Home Care Consult Complete -  Social Work Consult for [place] Planning/Counseling - Complete  Palliative Care Screening - Not Applicable  Medication Review Press photographer) - Complete  Some recent data might be hidden

## 2021-10-30 NOTE — Progress Notes (Signed)
Inpatient Diabetes Program Recommendations  AACE/ADA: New Consensus Statement on Inpatient Glycemic Control   Target Ranges:  Prepandial:   less than 140 mg/dL      Peak postprandial:   less than 180 mg/dL (1-2 hours)      Critically ill patients:  140 - 180 mg/dL    Latest Reference Range & Units 10/30/21 07:48  Glucose-Capillary 70 - 99 mg/dL 316 (H)    Latest Reference Range & Units 10/29/21 11:25 10/29/21 14:39 10/29/21 17:05 10/29/21 20:16  Glucose-Capillary 70 - 99 mg/dL 304 (H) 211 (H) 209 (H) 251 (H)   Review of Glycemic Control Home DM Meds: Jardiance 25 mg QD      OmniPod Insulin Pump (not wearing now)   Current Orders: Novolog 0-15 units TID, Novolog 0-5 units HQS, Novolog 5 units TID with meals, Levemir 15 units QHS; Decadron 4 mg Q6 hours   Omnipod insulin pump & Dexcom CGM (at home) Basal--0.9 units/hour (total daily basal 21.6 units) Insulin carb ratio--1 units: 10 carbs Correction-- 1 units drops blood sugar 40 mg/dL Target 140-150  Inpatient Diabetes Program Recommendations:    Insulin: If steroids are continued as ordered, please consider increasing Levemir to 20 units QHS and meal coverage to Novolog 8 units TID with meals  if patient eats at least 50% of meals.  Thanks, Barnie Alderman, RN, MSN, CDE Diabetes Coordinator Inpatient Diabetes Program [phone] (Team Pager from 8am to 5pm)

## 2021-10-30 NOTE — Plan of Care (Signed)

## 2021-10-30 NOTE — Progress Notes (Signed)
Orthopedic Tech Progress Note Patient Details:  Natalie Contreras 1966/01/23 [id number]  Patient ID: [name], female   DOB: [birth year], 56 y.o.   MRN: [id number]  Kennis Carina 10/30/2021, 10:55 AM Shrinker ordered from [facility]

## 2021-10-30 NOTE — Progress Notes (Signed)
Occupational Therapy Treatment Patient Details Name: Natalie Contreras MRN: [id number] DOB: Jun 19, 1966 Today's Date: 10/30/2021   History of present illness 56 y.o. female with medical history significant of DM2 with neuropathy and PAD, R BKA, dCHF, HTN. 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 Pt started on radiation therapy during stay. Pt presented to ED 10/23/21 for evaluation of leg swelling and difficulty with walking.   OT comments  Patient was able to participate in bathing tasks on edge of bed. Patient was agreeable to attempting to stand with donned prosthesis. Patient was unable to get prosthesis to lock into place. Patient was mod A x2 for multiple standing attempts to assist with seating prosthesis. Patient would continue to benefit from skilled OT services at this time while admitted and after d/c to address noted deficits in order to improve overall safety and independence in ADLs.     Recommendations for follow up therapy are one component of a multi-disciplinary discharge planning process, led by the attending physician.  Recommendations may be updated based on patient status, additional functional criteria and insurance authorization.    Follow Up Recommendations  Skilled nursing-short term rehab (<3 hours/day)    Assistance Recommended at Discharge Frequent or constant Supervision/Assistance  Patient can return home with the following  A little help with bathing/dressing/bathroom;Assistance with cooking/housework;Assist for transportation;A little help with walking and/or transfers   Equipment Recommendations  None recommended by OT    Recommendations for Other Services      Precautions / Restrictions Precautions Precautions: Fall Precaution Comments: R BKA with prosthesis Restrictions Weight Bearing Restrictions: No       Mobility Bed Mobility Overal bed mobility: Needs  Assistance Bed Mobility: Supine to Sit, Sit to Supine     Supine to sit: Mod assist Sit to supine: Mod assist   General bed mobility comments: Increased time. Assist for trunk steadying and to scoot to EOB-utilized bedpad to assist. Assist also required to return to bed.    Transfers                         Balance Overall balance assessment: Needs assistance Sitting-balance support: Feet supported, Bilateral upper extremity supported Sitting balance-Leahy Scale: Fair     Standing balance support: Bilateral upper extremity supported, Reliant on assistive device for balance Standing balance-Leahy Scale: Poor                             ADL either performed or assessed with clinical judgement   ADL Overall ADL's : Needs assistance/impaired         Upper Body Bathing: Set up;Sitting Upper Body Bathing Details (indicate cue type and reason): EOB Lower Body Bathing: Moderate assistance;Cueing for compensatory techniques Lower Body Bathing Details (indicate cue type and reason): sitting on EOB with increased time. patient noted to have wet bed with patient unaware of how saturated bed was prior to being made aware.                       General ADL Comments: patient was min guard for donning prostesis on edge of bed with increased time. patient unable to get residual limb seated in prostesis with multiple attempts. patient was mod A x2 for sit to stand with cues and physical assistance to maintain standing.    Extremity/Trunk Assessment  Vision       Perception     Praxis      Cognition Arousal/Alertness: Awake/alert Behavior During Therapy: WFL for tasks assessed/performed Overall Cognitive Status: No family/caregiver present to determine baseline cognitive functioning                                 General Comments: patient continues to have poor insight to deficits. patient was unable to follow cues to  scoot on edge of bed. patietn continues to have conflicting information reguarding PLOF and level of caregiver assist at home.        Exercises      Shoulder Instructions       General Comments      Pertinent Vitals/ Pain       Pain Assessment Pain Assessment: Faces Faces Pain Scale: No hurt  Home Living                                          Prior Functioning/Environment              Frequency  Min 2X/week        Progress Toward Goals  OT Goals(current goals can now be found in the care plan section)  Progress towards OT goals: Progressing toward goals     Plan Discharge plan remains appropriate;Frequency remains appropriate    Co-evaluation    PT/OT/SLP Co-Evaluation/Treatment: Yes Reason for Co-Treatment: For patient/therapist safety;To address functional/ADL transfers PT goals addressed during session: Mobility/safety with mobility OT goals addressed during session: ADL's and self-care      AM-PAC OT "6 Clicks" Daily Activity     Outcome Measure   Help from another person eating meals?: None Help from another person taking care of personal grooming?: A Little Help from another person toileting, which includes using toliet, bedpan, or urinal?: A Lot Help from another person bathing (including washing, rinsing, drying)?: A Lot Help from another person to put on and taking off regular upper body clothing?: A Little Help from another person to put on and taking off regular lower body clothing?: A Lot 6 Click Score: 16    End of Session Equipment Utilized During Treatment: Rolling walker (2 wheels);Gait belt  OT Visit Diagnosis: Unsteadiness on feet (R26.81);Other abnormalities of gait and mobility (R26.89);Muscle weakness (generalized) (M62.81)   Activity Tolerance Patient tolerated treatment well   Patient Left with call bell/phone within reach;in bed;with bed alarm set;with nursing/sitter in room   Nurse Communication  Mobility status        Time: 9767-3419 OT Time Calculation (min): 39 min  Charges: OT General Charges $OT Visit: 1 Visit OT Treatments $Self Care/Home Management : 23-37 mins  Jackelyn Poling OTR/L, MS Acute Rehabilitation Department Office# [phone] Pager# [phone]   Marcellina Millin 10/30/2021, 4:26 PM

## 2021-10-30 NOTE — Discharge Summary (Signed)
Physician Discharge Summary   Patient: Natalie [facility] MRN: [id number] DOB: 1966-03-07  Admit date:     10/23/2021  Discharge date: 10/30/21  Discharge Physician: Flora Lipps   PCP: Celene Squibb, MD   Recommendations at discharge:   Follow-up with your primary care provider in 1 week. Continue radiation treatment as outpatient. Continue physical therapy at home.  Discharge Diagnoses: Principal Problem:   Ambulatory dysfunction Active Problems:   Metastatic cancer to brain ([facility])   Benign essential HTN   Acute on chronic diastolic CHF (congestive heart failure) (HCC)   Acute blood loss anemia   DM2 (diabetes mellitus, type 2) (HCC)   Renal cell carcinoma (H[facility])   Hyperkalemia  Resolved Problems:   * No resolved [facility] problems. *   [facility] Course: Natalie [facility] is a 56 y.o. 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  She was supposed to go for radiation oncology appointment but could not go so she decided to come to the [facility].  In the ED patient had a CT scan done which showed multiple intracranial metastasis some hemorrhagic and some with associated vasogenic edema which was progressive in nature.  Patient was then considered for admission to [facility] for further evaluation treatment and assessment.  During hospitalization, patient was seen by radiation oncology and underwent simulation for radiation treatment on 10/24/2021.  Initial recommendation was skilled nursing facility placement but subsequently was considered for home with home health.  Assessment and Plan: * Ambulatory dysfunction- (present on  admission) Patient is right below-knee amputee uses prosthesis at baseline  been having difficulty ambulating recently.  Occupational Therapy working with prosthesis.  CT scan showed hemorrhagic metastasis with vasogenic edema.    Physical therapy recommends skilled nursing facility on discharge but patient will be approved for home with home health at this time.  TOC aware of significant limitations of the patient at home.  Metastatic cancer to brain Natalie [facility])- (present on admission) CT head scan showed worsened / increased appearance of lesions on CT possibly due to increased hemorrhage / vasogenic edema.  Patient received IV Decadron during hospitalization with PPI and sucralfate.  Plan is to transition to oral Decadron with PPI sucralfate on discharge.  Hyperkalemia Resolved.  Latest potassium of 4.7.  Renal cell carcinoma ([facility])- (present on admission) With brain and gastric metastasis.  Radiation treatment as per radiation oncology.    DM2 (diabetes mellitus, type 2) ([place]) With hyperglycemia on presentation..  Mild acidosis noted on admission so Jardiance was initially kept on hold..  Patient is on insulin pump at home.   Acute blood loss anemia- (present on admission) Hemoglobin was 7.7 on last discharge.  Latest hemoglobin at 8.4..  Patient does have history of GI bleed from fungating ulcer in the stomach which was confirmed to be metastatic renal cell carcinoma.  Continue PPI, sucralfate while on dexamethasone.  Hemoglobin has however remained stable during hospitalization.   Acute on chronic diastolic CHF (congestive heart failure) ([facility])- (present on admission) Patient with increased peripheral edema from baseline.  Initially received IV Lasix.  This has been changed to oral Lasix at this time  Creatinine at 1.0 from 1.4<1.4<1.3< 1.3.  2D echocardiogram from 10/25/2021 showed LV ejection fraction of 55 to 60%  with grade 3 diastolic dysfunction. Edema has improved.  We will continue oral Lasix  on discharge.  Benign essential HTN- (present on admission)  Patient's dose of Coreg has been increased to 6.25 twice daily.  Oral hydralazine has been initiated in the [facility] as well.  Might need better adjustment as outpatient.     Consultants:  Radiation oncology Hematooncology  Procedures performed: Radiation treatment   Disposition: Home with home health  Diet recommendation:  Discharge Diet Orders (From admission, onward)     Start     Ordered   10/29/21 0000  Diet - low sodium heart healthy        10/29/21 1142           Cardiac diet  DISCHARGE MEDICATION: Allergies as of 10/30/2021   No Known Allergies      Medication List     TAKE these medications    acetaminophen 325 MG tablet Commonly known as: TYLENOL Take 1-2 tablets (325-650 mg total) by mouth every 4 (four) hours as needed for mild pain.   carvedilol 6.25 MG tablet Commonly known as: COREG Take 1 tablet (6.25 mg total) by mouth 2 (two) times daily with a meal. What changed:  medication strength See the new instructions.   dexamethasone 4 MG tablet Commonly known as: DECADRON Take 1 tablet (4 mg total) by mouth 3 (three) times daily for 10 days.   Dexcom G6 Sensor Misc Inject 1 Device into the skin See admin instructions. Place 1 device/sensor into the skin every 10 days, after removing the former one   docusate sodium 100 MG capsule Commonly known as: COLACE Take 1 capsule (100 mg total) by mouth 2 (two) times daily. What changed:  when to take this reasons to take this   ergocalciferol 1.25 MG (50000 UT) capsule Commonly known as: VITAMIN D2 Take 50,000 Units by mouth every Sunday.   furosemide 40 MG tablet Commonly known as: LASIX Take 1 tablet (40 mg total) by mouth daily.   hydrALAZINE 25 MG tablet Commonly known as: APRESOLINE Take 1 tablet (25 mg total) by mouth every 8 (eight) hours.   hydrocortisone 25 MG suppository Commonly known as: ANUSOL-HC Place 1  suppository (25 mg total) rectally 2 (two) times daily.   insulin aspart 100 UNIT/ML injection Commonly known as: novoLOG Inject 100 Units into the skin See admin instructions. Up to 100 units per day in omni pod. Omnipod to be filled with Novolog to be accurately injected by Omnipod based on settings.   Jardiance 25 MG Tabs tablet Generic drug: empagliflozin Take 25 mg by mouth daily.   multivitamin with minerals Tabs tablet Take 1 tablet by mouth daily.   Omnipod DASH Pods (Gen 4) Misc Inject into the skin continuous.   ondansetron 4 MG tablet Commonly known as: ZOFRAN Take 1 tablet (4 mg total) by mouth every 6 (six) hours as needed for nausea.   oxyCODONE-acetaminophen 5-325 MG tablet Commonly known as: PERCOCET/ROXICET Take 1 tablet by mouth every 6 (six) hours as needed for moderate pain or severe pain.   pantoprazole 40 MG tablet Commonly known as: PROTONIX Take 1 tablet (40 mg total) by mouth 2 (two) times daily.   polyethylene glycol 17 g packet Commonly known as: MIRALAX / GLYCOLAX Take 17 g by mouth 2 (two) times daily. What changed:  when to take this reasons to take this   simvastatin 40 MG tablet Commonly known as: ZOCOR Take 40 mg by mouth at bedtime.   sucralfate  1 GM/10ML suspension Commonly known as: CARAFATE Take 10 mLs (1 g total) by mouth 4 (four) times daily -  with meals and at bedtime.        Follow-up Information     Celene Squibb, MD Follow up in 1 week(s).   Specialty: Internal Medicine Contact information: [place] [address] [phone]                Subjective. Patient was seen and examined at bedside.  Patient denies any pain, nausea, vomiting fever or chills.  Inquiring about going home   Discharge Exam: Filed Weights   10/25/21 0623 10/26/21 0451  Weight: 93.3 kg 95.1 kg   Vitals with BMI 10/30/2021 10/30/2021 10/29/2021  Height - - -  Weight - - -  BMI - - -  Systolic 798 921 194   Diastolic 55 64 61  Pulse 72 72 -  General: Obese built, not in obvious distress HENT:   No scleral pallor or icterus noted. Oral mucosa is moist.  Chest:  Clear breath sounds.  Diminished breath sounds bilaterally. No crackles or wheezes.  CVS: S1 &S2 heard. No murmur.  Regular rate and rhythm. Abdomen: Soft, nontender, nondistended.  Bowel sounds are heard.   Extremities: Right below-knee amputation, left lower extremity mild edema. Psych: Alert, awake and oriented, normal mood CNS:  No cranial nerve deficits.  Power equal in all extremities.   Skin: Warm and dry.  No rashes noted.   Condition at discharge: good  The results of significant diagnostics from this hospitalization (including imaging, microbiology, ancillary and laboratory) are listed below for reference.   Imaging Studies: DG Chest 2 View  Result Date: 10/13/2021 CLINICAL DATA:  smoker. weakness. rule out infection/malignancy EXAM: CHEST - 2 VIEW COMPARISON:  April 07, 2017 FINDINGS: The cardiomediastinal silhouette is enlarged in contour.Atherosclerotic calcifications. No pleural effusion. No pneumothorax. Multiple nodular opacities throughout bilateral lungs. Surgical clips project over the upper abdomen. Suggestion of sclerosis of several vertebral bodies. IMPRESSION: 1. Multiple nodular opacities bilaterally. Suggestion of multifocal sclerosis of the vertebral bodies versus summation artifact. Findings are concerning for multifocal metastases. Recommend further dedicated evaluation with cross-sectional imaging. Electronically Signed   By: Valentino Saxon M.D.   On: 10/13/2021 15:29   CT Head Wo Contrast  Result Date: 10/24/2021 CLINICAL DATA:  Brain/CNS neoplasm to assess treatment response. EXAM: CT HEAD WITHOUT CONTRAST TECHNIQUE: Contiguous axial images were obtained from the base of the skull through the vertex without intravenous contrast. RADIATION DOSE REDUCTION: This exam was performed according to the  departmental dose-optimization program which includes automated exposure control, adjustment of the mA and/or kV according to patient size and/or use of iterative reconstruction technique. COMPARISON:  MRI brain 10/19/2021.  CT head 10/13/2021 FINDINGS: Brain: Multiple intracranial metastases are again demonstrated. Largest is a heterogeneous mixed density but mostly hyperdense lesion in the left parietal lobe measuring 3 x 3.7 cm diameter. Size measures larger than on previous CT. There is surrounding vasogenic edema as before. Multiple additional mostly hyperdense lesions are also demonstrated. Lesions are more numerous and larger than on the previous CT but were demonstrated on the prior MRI. Hyperdensity in the lesions suggest hemorrhagic component. No mass effect or midline shift. No abnormal extra-axial fluid collections. Ventricles are not dilated. Vascular: Intracranial arterial vascular calcifications are present. Skull: Normal. Negative for fracture or focal lesion. Sinuses/Orbits: Paranasal sinuses and mastoid air cells are clear. Other: None. IMPRESSION: Multiple intracranial metastases, some hemorrhagic, and  associated vasogenic white matter edema demonstrating progression since prior CT, possibly due to intervening hemorrhage within the lesions. Size and distribution of the lesions is similar to the interval MRI. Greater than 10 lesions are present. No acute mass effect. Electronically Signed   By: Lucienne Capers M.D.   On: 10/24/2021 00:32   CT Head Wo Contrast  Result Date: 10/13/2021 CLINICAL DATA:  dizziness. blurred vision. EXAM: CT HEAD WITHOUT CONTRAST TECHNIQUE: Contiguous axial images were obtained from the base of the skull through the vertex without intravenous contrast. RADIATION DOSE REDUCTION: This exam was performed according to the departmental dose-optimization program which includes automated exposure control, adjustment of the mA and/or kV according to patient size and/or use  of iterative reconstruction technique. COMPARISON:  February 17, 2017. FINDINGS: Brain: There is a lesion in the LEFT parieto-occipital lobe with adjacent vasogenic edema and a peripheral rim of intrinsic hyperdensity. It measures 2.6 x 2.4 by 2.7 cm. There is an additional intrinsically hyperdense region along the RIGHT frontoparietal falx which measures 7 by 5 mm which is concerning for an additional site of disease (series 2, image 23). Hyperdensity of the LEFT frontal lobe measuring approximately 5 mm likely reflects an additional site of disease (series 2, image 25). Hypodensity of the RIGHT occipital lobe with a small amount of intrinsic hyperdensity likely reflecting additional lesion (series 2, image 12; series 5, image 28). Hypodensity of the LEFT inferior cerebellum, consistent with the sequela of remote prior infarction and similar comparison to prior. No significant midline shift. Vascular: Vascular calcifications of the carotid siphons. Skull: No acute fracture. Sinuses/Orbits: LEFT mastoid effusion. Other: None. IMPRESSION: 1. There is a 2.7 cm peripherally hyperdense masslike area in the LEFT parieto-occipital lobe with adjacent vasogenic edema. 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 Recommend further evaluation with dedicated brain MRI with and without contrast. 2. There are likely additional lesions in the bilateral cerebral hemispheres. These results were called by telephone at the time of interpretation on 10/13/2021 at 3:20 pm to provider MARGAUX VENTER , who verbally acknowledged these results. Electronically Signed   By: Valentino Saxon M.D.   On: 10/13/2021 15:28   MR Brain W Wo Contrast  Result Date: 10/20/2021 CLINICAL DATA:  Brain metastases, unknown primary 3T SRS Protocol for treatment planning EXAM: MRI HEAD WITHOUT AND WITH CONTRAST TECHNIQUE: Multiplanar, multiecho pulse sequences of the brain and surrounding structures were obtained without and with intravenous contrast.  CONTRAST:  41mL GADAVIST GADOBUTROL 1 MMOL/ML IV SOLN COMPARISON:  10/15/2021 FINDINGS: Brain: Multiple parenchymal masses are again identified. Possible dural lesion along the falx is also again noted. In the interval, there has been evolution of hemorrhage associated with the lesions that now demonstrate significant intrinsic T1 shortening including within the surrounding area of edema. Therefore, evaluation for enhancement is now limited. Greater than 10 lesions are present. A few were not visible on the prior study or are new. For example, along the body of the caudate on the right (series 1100, image 178) and several punctate lesions in the right cerebral hemisphere (images 178, 181, 192, 197, and 202). Many of these lesions demonstrate corresponding diffusion hyperintensity. There also additional foci of diffusion hyperintensity in the frontoparietal white matter bilaterally without definite corresponding enhancement. This was also present on the prior study. The right caudate body lesion was visible on SWI on the prior study. There is trace subdural hemorrhage along the cerebral convexities posteriorly. Likely minimal sulcal subarachnoid hemorrhage associated with the dominant left parieto-occipital lesion. No  significant change in mass effect. Vascular: Major vessel flow voids at the skull base are preserved. Skull and upper cervical spine: Normal marrow signal is preserved. Sinuses/Orbits: Paranasal sinuses are aerated. Orbits are unremarkable. Other: Sella is unremarkable.  Mastoid air cells are clear. IMPRESSION: Multiple (greater than 10) hemorrhagic lesions are present with evolution of intralesional hemorrhage in the interval. There is T1 hyperintensity of the perilesional edema, a finding more commonly associated with cavernous malformations, but these are presumed to reflect metastases in the setting of renal malignancy. A few of the lesions were not visible on the prior study or are new. Many of  these demonstrate corresponding diffusion hyperintensity. Therefore, subacute infarcts are consideration for this subset. Electronically Signed   By: Macy Mis M.D.   On: 10/20/2021 14:05   MR BRAIN W WO CONTRAST  Result Date: 10/16/2021 CLINICAL DATA:  CNS neoplasm, [facility] protocol EXAM: MRI HEAD WITHOUT AND WITH CONTRAST TECHNIQUE: Multiplanar, multiecho pulse sequences of the brain and surrounding structures were obtained without and with intravenous contrast. CONTRAST:  21mL GADAVIST GADOBUTROL 1 MMOL/ML IV SOLN COMPARISON:  No prior MRI, correlation is made with 10/13/2021 CT head FINDINGS: Evaluation is limited by motion artifact. Brain: Multiple enhancing masses in the bilateral cerebral hemispheres, the largest of which is in the left parietooccipital lobe and measures up to 3.3 x 2.9 x 3.3 cm (AP x TR x CC) (series 9, image 92). Additional smaller lesions include a left parietal lesion that measures 0.7 x 0.5 x 0.7 cm (series 9, image 39 and series 10, image 29), a left frontal lobe lesion that measures 0.6 x 0.4 x 0.6 cm (series 9, image 139 and series 10, image 43), a right frontal lobe lesion that measures 0.3 x 0.4 x 0.4 cm (series 9, image 50 and series 10, image 52), a right extra-axial lesion along the falx adjacent to the right parietal lobe that measures 1.0 x 0.5 x 1.0 cm (series 9, image 60 and series 10, image 23), right parietal lesion that measures 1.0 x 0.9 x 1.0 cm (series 9, image 80 and series 10, image 20), a left parietal lesion that measures 0.4 x 0.8 x 0.5 cm (series 9, image 82 and series 10, image 22), a left occipital lesion that measures 0.5 x 0.8 x 0.6 cm (series 9, image 124 and series 10, image 8), and a right occipital lesion (or possibly 2 adjacent lesions) that measures 1.2 x 1.7 x 0.9 cm (series 9, image 128 and series 10, image 8). Possible additional lesions in the right frontal lobe adjacent to the right lateral ventricle, which is faintly visualized on the  coronal postcontrast sequences (series 10, image 32), measuring 0.6 x 0.5 cm, and in the atrium of the right lateral ventricle (series 11, image 26), measuring approximately 0.4 cm, best seen on the sagittal sequence. These lesions are associated with surrounding T2 hyperintense signal, likely edema, with the largest area of edema surrounding the dominant left parieto-occipital lesion. Several of these lesions are also associated with hemosiderin deposition (series 6, image 54, 76, 81, and 82). Two rounded areas of hemosiderin deposition in the left lateral ventricle atrium (series 6, image 76 and 69) are difficult to find definite enhancing lesions to correlate with on the axial sequences, given enhancement in the choroid plexus, but possible correlates are suspected on the sagittal postcontrast sequence (series 11, image 24 and 26). No other areas of parenchymal hemorrhage. No definite midline shift. Remote infarcts in the bilateral cerebellar hemispheres  and basal ganglia. No hydrocephalus or extra-axial collection. No diffusion-weighted sequence was available to evaluate for infarct given the selected protocol. Vascular: The left PCA flow void is not well visualized, however the vessel is seen on postcontrast imaging. Otherwise normal flow voids. Skull and upper cervical spine: Normal marrow signal. Sinuses/Orbits: Status post bilateral lens replacements. Otherwise negative. Other: Trace fluid in the mastoid air cells. IMPRESSION: Evaluation is limited by motion artifact. Within this limitation, multiple enhancing lesions in the bilateral cerebral hemispheres, with additional extra-axial lesion along the right parietal lobe and possible lesions in the atrium of the left lateral ventricle. Several of these lesions demonstrate associated edema and intralesional hemorrhage, including the dominant left parieto-occipital lesion. These are overall concerning for metastatic disease. Additional lesions may be present,  but obscured by the motion artifact. Electronically Signed   By: Merilyn Baba M.D.   On: 10/16/2021 02:05   CT CHEST ABDOMEN PELVIS W CONTRAST  Result Date: 10/13/2021 CLINICAL DATA:  Brain metastasis of unknown primary. EXAM: CT CHEST, ABDOMEN, AND PELVIS WITH CONTRAST TECHNIQUE: Multidetector CT imaging of the chest, abdomen and pelvis was performed following the standard protocol during bolus administration of intravenous contrast. RADIATION DOSE REDUCTION: This exam was performed according to the departmental dose-optimization program which includes automated exposure control, adjustment of the mA and/or kV according to patient size and/or use of iterative reconstruction technique. CONTRAST:  56mL OMNIPAQUE IOHEXOL 300 MG/ML  SOLN COMPARISON:  None. FINDINGS: CT CHEST FINDINGS Cardiovascular: Mild cardiomegaly. Aortic and coronary atherosclerotic calcification noted. Mediastinum/Lymph Nodes: Mild bilateral hilar lymphadenopathy is seen. No pathologically enlarged mediastinal or axillary lymph nodes are identified. Lungs/Pleura: Multiple solid bilateral pulmonary nodules are seen, consistent with diffuse pulmonary metastases. Index nodule in the medial right lower lobe measures 2.9 x 1.7 cm on image 92/3. Index nodule in the posterolateral left lower lobe measures 4.4 x 2.2 cm on image 109/3. Small left pleural effusion also seen. Musculoskeletal: Multiple chest wall soft tissue masses are seen bilaterally, involving the breasts, chest wall muscles and subcutaneous tissues. The 2 largest masses involve the right pectoralis major muscle, measuring 3.3 x 2.9 cm on image 12/2, and the right supraspinatus muscle measuring 5.7 x 3.2 cm on image 21/2. CT ABDOMEN AND PELVIS FINDINGS Hepatobiliary: Multiple hypovascular masses are seen throughout the right and left hepatic lobes, largest in the lateral dome of the right hepatic lobe measuring 10.7 x 5.1 cm. These are consistent diffuse liver metastases. Prior  cholecystectomy. No evidence of biliary obstruction. Pancreas:  No mass or inflammatory changes. Spleen:  Within normal limits in size and appearance. Adrenals/Urinary tract: A small right adrenal mass is seen measuring 2.0 x 1.5 cm, which is indeterminate but suspicious for adrenal metastasis. A large heterogeneously enhancing mass is seen involving the posterior left kidney, which shows invasion of the posterior abdominal wall soft tissues. This measures 10.3 x 8.9 cm, and is consistent with primary renal cell carcinoma. Smaller enhancing soft tissue nodules are also seen in the left posterior pararenal space, consistent with metastatic disease. Two small masses are seen along the capsular surface of the anterior mid and lower poles of the right kidney, which measure 2.2 cm and 1.5 cm in diameter. These could represent synchronous primary renal cell carcinomas of the right kidney, or metastatic disease. Two soft tissue nodules are involve the lateral portion of Gerota's fascia on the right, largest measuring 2.2 x 1.6 cm. A subcapsular cyst measuring 4 cm is also seen in the lateral interpolar region  of the right kidney. Stomach/Bowel: No evidence of obstruction, inflammatory process, or abnormal fluid collections. Vascular/Lymphatic: Mild lymphadenopathy is seen in the left paraaortic region, largest measuring 10 mm on image 67/2. A 1.3 cm lymph node is seen in the deep left abdominal small bowel mesentery adjacent to the duodenum. No pelvic lymphadenopathy identified. Aortic atherosclerotic calcification noted. Left retroaortic renal vein is noted, however there is no evidence of renal vein or IVC thrombus. Reproductive: No masses identified. Small amount of gas noted within the endometrial cavity uterus. Other:  None. Musculoskeletal: No suspicious bone lesions identified. Multiple small masses are seen scattered throughout the abdominal wall soft tissues, consistent with metastatic disease. IMPRESSION: 10 cm  left renal mass with invasion of the left posterior abdominal wall soft tissues, consistent with primary renal cell carcinoma. Adjacent metastatic soft tissue nodules also noted within the left perinephric space. Diffuse bilateral pulmonary metastases. Two small masses along the capsular surface of the right kidney, which may represent synchronous primary renal cell carcinomas or metastatic disease. Metastatic disease also involving Gerota's fascia on the right. Mild abdominal lymphadenopathy, consistent with metastatic disease. Diffuse liver metastases. Small right adrenal mass, suspicious for metastatic disease. Diffuse bilateral pulmonary metastases. 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 Electronically Signed   By: Marlaine Hind M.D.   On: 10/13/2021 17:53   ECHOCARDIOGRAM COMPLETE  Result Date: 10/25/2021    ECHOCARDIOGRAM REPORT   Patient Name:   CARROLL RANNEY Date of Exam: 10/25/2021 Medical Rec #:  [id number]       Height:       64.0 in Accession #:    [id number]      Weight:       205.7 lb Date of Birth:  09/23/1965      BSA:          1.980 m Patient Age:    30 years        BP:           143/61 mmHg Patient Gender: F               HR:           77 bpm. Exam Location:  Inpatient Procedure: 2D Echo Indications:    Acute diastolic CHF  History:        Patient has prior history of Echocardiogram examinations, most                 recent 10/29/2016. CHF; Risk Factors:Diabetes and Hypertension.  Sonographer:    Arlyss Gandy Referring Phys: [id number] [place]  1. Left ventricular ejection fraction, by estimation, is 55 to 60%. The left ventricle has normal function. The left ventricle has no regional wall motion abnormalities. Left ventricular diastolic parameters are consistent with Grade III diastolic dysfunction (restrictive). Elevated left ventricular end-diastolic pressure.  2. Right ventricular systolic function is normal. The right  ventricular size is mildly enlarged. There is severely elevated pulmonary artery systolic pressure. The estimated right ventricular systolic pressure is 65.0 mmHg.  3. Left atrial size was moderately dilated.  4. Right atrial size was mildly dilated.  5. The mitral valve is grossly normal. Mild to moderate mitral valve regurgitation.  6. Tricuspid valve regurgitation is mild to moderate.  7. The aortic valve is tricuspid. There is mild calcification of the aortic valve. There is mild thickening of the aortic valve. Aortic valve regurgitation is not visualized. Aortic valve sclerosis/calcification is present, without any evidence of  aortic stenosis.  8. Aortic aortic arch not well visualized.  9. The inferior vena cava is dilated in size with <50% respiratory variability, suggesting right atrial pressure of 15 mmHg. Comparison(s): Changes from prior study are noted. Severely elevated pulmonary pressures on current study. FINDINGS  Left Ventricle: Left ventricular ejection fraction, by estimation, is 55 to 60%. The left ventricle has normal function. The left ventricle has no regional wall motion abnormalities. The left ventricular internal cavity size was normal in size. There is  borderline left ventricular hypertrophy. Left ventricular diastolic parameters are consistent with Grade III diastolic dysfunction (restrictive). Elevated left ventricular end-diastolic pressure. Right Ventricle: The right ventricular size is mildly enlarged. Right vetricular wall thickness was not well visualized. Right ventricular systolic function is normal. There is severely elevated pulmonary artery systolic pressure. The tricuspid regurgitant velocity is 3.64 m/s, and with an assumed right atrial pressure of 15 mmHg, the estimated right ventricular systolic pressure is 24.4 mmHg. Left Atrium: Left atrial size was moderately dilated. Right Atrium: Right atrial size was mildly dilated. Pericardium: There is no evidence of pericardial  effusion. Mitral Valve: The mitral valve is grossly normal. There is mild thickening of the mitral valve leaflet(s). There is mild calcification of the mitral valve leaflet(s). Mild to moderate mitral annular calcification. Mild to moderate mitral valve regurgitation. MV peak gradient, 11.0 mmHg. The mean mitral valve gradient is 4.0 mmHg. Tricuspid Valve: The tricuspid valve is grossly normal. Tricuspid valve regurgitation is mild to moderate. No evidence of tricuspid stenosis. Aortic Valve: The aortic valve is tricuspid. There is mild calcification of the aortic valve. There is mild thickening of the aortic valve. Aortic valve regurgitation is not visualized. Aortic valve sclerosis/calcification is present, without any evidence of aortic stenosis. Aortic valve mean gradient measures 9.5 mmHg. Aortic valve peak gradient measures 16.6 mmHg. Aortic valve area, by VTI measures 1.50 cm. Pulmonic Valve: The pulmonic valve was not well visualized. Pulmonic valve regurgitation is not visualized. No evidence of pulmonic stenosis. Aorta: The aortic root, ascending aorta, aortic arch and descending aorta are all structurally normal, with no evidence of dilitation or obstruction and aortic arch not well visualized. Venous: The inferior vena cava is dilated in size with less than 50% respiratory variability, suggesting right atrial pressure of 15 mmHg. IAS/Shunts: The atrial septum is grossly normal.  LEFT VENTRICLE PLAX 2D LVIDd:         5.10 cm   Diastology LVIDs:         3.50 cm   LV e' medial:    6.53 cm/s LV PW:         1.10 cm   LV E/e' medial:  23.9 LV IVS:        1.10 cm   LV e' lateral:   8.59 cm/s LVOT diam:     2.00 cm   LV E/e' lateral: 18.2 LV SV:         78 LV SV Index:   40 LVOT Area:     3.14 cm  RIGHT VENTRICLE             IVC RV Basal diam:  4.10 cm     IVC diam: 2.50 cm RV Mid diam:    3.20 cm RV S prime:     11.60 cm/s TAPSE (M-mode): 1.8 cm LEFT ATRIUM             Index        RIGHT ATRIUM  Index LA diam:        4.20 cm 2.12 cm/m   RA Area:     17.30 cm LA Vol (A2C):   76.3 ml 38.54 ml/m  RA Volume:   44.50 ml  22.48 ml/m LA Vol (A4C):   61.1 ml 30.86 ml/m LA Biplane Vol: 68.5 ml 34.60 ml/m  AORTIC VALVE AV Area (Vmax):    1.51 cm AV Area (Vmean):   1.47 cm AV Area (VTI):     1.50 cm AV Vmax:           204.00 cm/s AV Vmean:          144.000 cm/s AV VTI:            0.522 m AV Peak Grad:      16.6 mmHg AV Mean Grad:      9.5 mmHg LVOT Vmax:         97.90 cm/s LVOT Vmean:        67.500 cm/s LVOT VTI:          0.249 m LVOT/AV VTI ratio: 0.48  AORTA Ao Asc diam: 2.90 cm MITRAL VALVE                TRICUSPID VALVE MV Area (PHT): 3.42 cm     TR Peak grad:   53.0 mmHg MV Area VTI:   1.96 cm     TR Vmax:        364.00 cm/s MV Peak grad:  11.0 mmHg MV Mean grad:  4.0 mmHg     SHUNTS MV Vmax:       1.66 m/s     Systemic VTI:  0.25 m MV Vmean:      85.4 cm/s    Systemic Diam: 2.00 cm MV Decel Time: 222 msec MV E velocity: 156.00 cm/s MV A velocity: 71.00 cm/s MV E/A ratio:  2.20 Buford Dresser MD Electronically signed by Buford Dresser MD Signature Date/Time: 10/25/2021/8:05:43 PM    Final    [place] CORE BIOPSY (SOFT TISSUE)  Result Date: 10/17/2021 INDICATION: 56 year old female with history of left renal mass and multiple diffuse metastatic lesions. EXAM: Ultrasound-guided soft tissue mass biopsy MEDICATIONS: None. ANESTHESIA/SEDATION: Moderate (conscious) sedation was employed during this procedure. A total of Versed 1 mg and Fentanyl 25 mcg was administered intravenously. Moderate Sedation Time: 8 minutes. The patient's level of consciousness and vital signs were monitored continuously by radiology nursing throughout the procedure under my direct supervision. FLUOROSCOPY TIME:  None. COMPLICATIONS: None immediate. PROCEDURE: Informed written consent was obtained from the patient after a thorough discussion of the procedural risks, benefits and alternatives. All questions were addressed.  Maximal Sterile Barrier Technique was utilized including caps, mask, sterile gowns, sterile gloves, sterile drape, hand hygiene and skin antiseptic. A timeout was performed prior to the initiation of the procedure. Preprocedure ultrasound evaluation of the right posterior flank demonstrates subcutaneous hypoechoic soft tissue nodule measuring approximately 2.0 x 1.0 cm. Procedure was planned. Subdermal Local anesthesia was provided at the planned needle entry site. Deeper local anesthetic was administered under ultrasound guidance to the periphery of the targeted mass. A small skin nick was made. Under direct ultrasound visualization, a 17 gauge introducer needle was directed to the periphery of the mass. Next, a total of 3, 18 gauge core biopsies were obtained and placed in formalin. The introducer needle was removed. Postprocedure image demonstrated no evidence of surrounding hematoma or other procedure related complication. The patient tolerated the procedure well and was transferred  back to the floor in stable condition. IMPRESSION: Technically successful ultrasound-guided core needle biopsy of right posterior flank subcutaneous soft tissue mass. Ruthann Cancer, MD [facility] Radiology Electronically Signed   By: Ruthann Cancer M.D.   On: 10/17/2021 12:02   VAS [place] LOWER EXTREMITY VENOUS (DVT) (7a-7p)  Result Date: 10/24/2021  Lower Venous DVT Study Patient Name:  ANIVEA VELASQUES  Date of Exam:   10/23/2021 Medical Rec #: [id number]        Accession #:    [id number] Date of Birth: Apr 02, 1966       Patient Gender: F Patient Age:   56 years Exam Location:  [facility] Procedure:      VAS [place] LOWER EXTREMITY VENOUS (DVT) Referring Phys: Wille Glaser KNAPP --------------------------------------------------------------------------------  Indications: Swelling of left lower extremity.  Limitations: Poor ultrasound/tissue interface. Comparison Study: 07-17-2017 Prior  bilateral lower extremity venous was negative                   for DVT. Performing Technologist: Darlin Coco RDMS, RVT  Examination Guidelines: A complete evaluation includes B-mode imaging, spectral Doppler, color Doppler, and power Doppler as needed of all accessible portions of each vessel. Bilateral testing is considered an integral part of a complete examination. Limited examinations for reoccurring indications may be performed as noted. The reflux portion of the exam is performed with the patient in reverse Trendelenburg.  +-----+---------------+---------+-----------+----------+--------------+  RIGHT Compressibility Phasicity Spontaneity Properties Thrombus Aging  +-----+---------------+---------+-----------+----------+--------------+  CFV   Full            Yes       Yes                                    +-----+---------------+---------+-----------+----------+--------------+   +---------+---------------+---------+-----------+----------+-------------------+  LEFT      Compressibility Phasicity Spontaneity Properties Thrombus Aging       +---------+---------------+---------+-----------+----------+-------------------+  CFV       Full            Yes       Yes                                         +---------+---------------+---------+-----------+----------+-------------------+  SFJ       Full                                                                  +---------+---------------+---------+-----------+----------+-------------------+  FV Prox   Full                                                                  +---------+---------------+---------+-----------+----------+-------------------+  FV Mid    Full                                                                  +---------+---------------+---------+-----------+----------+-------------------+  FV Distal Full                                                                   +---------+---------------+---------+-----------+----------+-------------------+  PFV       Full                                                                  +---------+---------------+---------+-----------+----------+-------------------+  POP       Full            Yes       Yes                                         +---------+---------------+---------+-----------+----------+-------------------+  PTV                                                        Not well visualized  +---------+---------------+---------+-----------+----------+-------------------+  PERO                                                       Not well visualized  +---------+---------------+---------+-----------+----------+-------------------+    Summary: RIGHT: - No evidence of common femoral vein obstruction.  LEFT: - There is no evidence of deep vein thrombosis in the lower extremity. However, portions of this examination were limited- see technologist comments above.  - No cystic structure found in the popliteal fossa.  *See table(s) above for measurements and observations. Electronically signed by Harold Barban MD on 10/24/2021 at 8:53:54 PM.    Final     Microbiology: Results for orders placed or performed during the Contreras encounter of 10/23/21  Resp Panel by RT-PCR (Flu A&B, Covid) Nasopharyngeal Swab     Status: None   Collection Time: 10/24/21  7:31 AM   Specimen: Nasopharyngeal Swab; Nasopharyngeal(NP) swabs in vial transport medium  Result Value Ref Range Status   SARS Coronavirus 2 by RT PCR NEGATIVE NEGATIVE Final    Comment: (NOTE) SARS-CoV-2 target nucleic acids are NOT DETECTED.  The SARS-CoV-2 RNA is generally detectable in upper respiratory specimens during the acute phase of infection. The lowest concentration of SARS-CoV-2 viral copies this assay can detect is 138 copies/mL. A negative result does not preclude SARS-Cov-2 infection and should not be used as the sole basis for treatment or other patient  management decisions. A negative result may occur with  improper specimen collection/handling, submission of specimen other than nasopharyngeal swab, presence of viral mutation(s) within the areas targeted by this assay, and inadequate number of viral copies(<138 copies/mL). A negative result must be combined with clinical observations, patient history, and epidemiological information. The expected result is Negative.  Fact Sheet for Patients:  EntrepreneurPulse.com.au  Fact Sheet for Healthcare Providers:  IncredibleEmployment.be  This test is no t yet approved or cleared by the [place] FDA and  has been authorized for detection and/or diagnosis of SARS-CoV-2 by FDA under an Emergency Use Authorization (EUA). This EUA will remain  in effect (meaning this test can be used) for the duration of the COVID-19 declaration under Section 564(b)(1) of the Act, 21 U.S.C.section 360bbb-3(b)(1), unless the authorization is terminated  or revoked sooner.       Influenza A by PCR NEGATIVE NEGATIVE Final   Influenza B by PCR NEGATIVE NEGATIVE Final    Comment: (NOTE) The Xpert Xpress SARS-CoV-2/FLU/RSV plus assay is intended as an aid in the diagnosis of influenza from Nasopharyngeal swab specimens and should not be used as a sole basis for treatment. Nasal washings and aspirates are unacceptable for Xpert Xpress SARS-CoV-2/FLU/RSV testing.  Fact Sheet for Patients: EntrepreneurPulse.com.au  Fact Sheet for Healthcare Providers: IncredibleEmployment.be  This test is not yet approved or cleared by the [place] FDA and has been authorized for detection and/or diagnosis of SARS-CoV-2 by FDA under an Emergency Use Authorization (EUA). This EUA will remain in effect (meaning this test can be used) for the duration of the COVID-19 declaration under Section 564(b)(1) of the Act, 21 U.S.C. section 360bbb-3(b)(1),  unless the authorization is terminated or revoked.  Performed at [facility], [place] [address] [place], [place] [address]     Labs: CBC: Recent Labs  Lab 10/24/21 [phone] 10/25/21 0422 10/26/21 0437 10/27/21 0439 10/28/21 0714  WBC 5.7 6.8 7.3 5.9 6.7  HGB 7.9* 7.7* 7.8* 8.0* 8.4*  HCT 27.2* 26.4* 26.3* 27.9* 28.8*  MCV 82.7 81.7 82.2 83.3 82.5  PLT 387 344 319 328 341   Basic Metabolic Panel: Recent Labs  Lab 10/25/21 0422 10/26/21 0437 10/27/21 0439 10/28/21 0443 10/29/21 0422  NA 134* 138 138 137 137  K 3.9 4.5 4.7 5.5* 4.7  CL 103 107 107 108 107  CO2 20* 22 22 20* 22  GLUCOSE 297* 254* 259* 208* 206*  BUN 36* 49* 63* 67* 72*  CREATININE 1.34* 1.43* 1.46* 1.43* 1.07*  CALCIUM 8.4* 8.5* 8.6* 8.5* 8.8*  MG 2.0 2.1  --   --   --    Liver Function Tests: No results for input(s): AST, ALT, ALKPHOS, BILITOT, PROT, ALBUMIN in the last 168 hours. CBG: Recent Labs  Lab 10/29/21 1439 10/29/21 1705 10/29/21 2016 10/30/21 0748 10/30/21 1254  GLUCAP 211* 209* 251* 316* 351*    Discharge time spent: greater than 30 minutes.  Signed: Flora Lipps, MD [facility] 10/30/2021

## 2021-10-30 NOTE — Progress Notes (Signed)
PROGRESS NOTE    Natalie Contreras  DGL:[id number] DOB: [birth year] DOA: 10/23/2021 PCP: Celene Squibb, MD    Brief Narrative:  Natalie Contreras is a 56 y.o. 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  She was supposed to go for radiation oncology appointment but could not go so she decided to come to the hospital.  In the ED patient had a CT scan done which showed multiple intracranial metastasis some hemorrhagic and some with associated vasogenic edema which was progressive in nature.  Patient was then considered for admission to hospital for further evaluation treatment and assessment.  During hospitalization, patient was seen by radiation oncology and underwent simulation for radiation treatment on 10/24/2021.  Initial recommendation was skilled nursing facility placement but subsequently was considered for home with home health but discharge was canceled due to need for maximum assist with 3-4 persons just to be able to get out of the bed with ongoing need for radiation treatment with difficulty transportation and transfers.  Radiation treatment initial going up to this Friday.    Assessment and Plan: * Ambulatory dysfunction- (present on admission) Patient is right below-knee amputee uses prosthesis at baseline  been having difficulty ambulating recently.  CT scan showed hemorrhagic metastasis with vasogenic edema.    Physical therapy recommends skilled nursing facility on discharge.  Plan was home with home health but patient had significant issues with needing maximum assist.  TOC aware of this situation.  Metastatic cancer to brain [facility])-  (present on admission) CT head scan showed worsened / increased appearance of lesions on CT possibly due to increased hemorrhage / vasogenic edema. on IV Decadron, continue PPI and sucralfate when on Decadron.  Radiation staff are concerned about her transportation back and forth and suggest inpatient stay for this week.  Plan is to transition to oral Decadron and discharged with PPI sucralfate on discharge.  Hyperkalemia Resolved.  Latest potassium of 4.7.  Renal cell carcinoma ([place])- (present on admission) With brain and gastric metastasis.  Radiation treatment as per radiation oncology.    DM2 (diabetes mellitus, type 2) ([place]) With hyperglycemia on presentation..  Mild acidosis noted on admission so Jardiance on hold.  Patient is on insulin pump at home.  Continue sliding scale insulin, mealtime insulin and long-acting insulin while on IV Decadron.  Diabetic coordinator on board and adjusting insulin doses.  Acute blood loss anemia- (present on admission) Hemoglobin was 7.7 on last discharge.  Latest hemoglobin at 8.4..  Patient does have history of GI bleed from fungating ulcer in the stomach which was confirmed to be metastatic renal cell carcinoma.  Continue PPI, sucralfate while on dexamethasone.  Hemoglobin has however remained stable during hospitalization.  Check CBC in AM.  Acute on chronic diastolic CHF (congestive heart failure) ([place])- (present on admission) Patient with increased peripheral edema from baseline.  Initially received IV Lasix.  This has been changed to oral Lasix at this time  Creatinine at 1.0 from 1.4<1.4<1.3< 1.3.  2D echocardiogram from 10/25/2021 showed LV ejection fraction of 55 to 60% with grade 3 diastolic dysfunction. Edema has improved.  We will continue oral Lasix on discharge.  Benign essential HTN- (present on admission) Blood pressure is still elevated.  Patient's dose  of Coreg has been increased to 6.25 twice daily.  Oral hydralazine has been initiated in  the hospital as well but patient would likely benefit from adding ACE inhibitor/ARB.  We will initiate the low-dose losartan starting today.  Could possibly discontinue hydralazine prior to discharge    DVT prophylaxis: SCDs Start: 10/24/21 0422   Code Status:     Code Status: Full Code  Disposition:  Skilled nursing facility as per PT evaluation.  Unsafe disposition home due to maximum assist, need for ongoing radiation treatment until Friday.  TOC on board for disposition plan.  Status is: Inpatient  Remains inpatient appropriate because: Ambulatory dysfunction, need for placement/disposition issue, ongoing radiation treatment   Family Communication:  Spoke with the patient at bedside.  I tried to reach the patient's daughter on the phone but was unable to reach her today  Consultants:  Radiation oncology Hematooncology  Procedures:  Radiation treatment  Antimicrobials:  None  Anti-infectives (From admission, onward)    None       Subjective: Today, patient was seen and examined at bedside.  Denies any nausea vomiting, abdominal pain.  No shortness of breath cough fever chills  Objective: Vitals:   10/29/21 1128 10/29/21 2015 10/29/21 2146 10/30/21 0458  BP: (!) 186/63 (!) 136/41 137/61 (!) 164/64  Pulse: 71 73  72  Resp: 20 16  17   Temp: 97.9 F (36.6 C) 98 F (36.7 C)  98 F (36.7 C)  TempSrc: Oral Oral    SpO2: 100% 96%  94%  Weight:      Height:        Intake/Output Summary (Last 24 hours) at 10/30/2021 1116 Last data filed at 10/30/2021 0600 Gross per 24 hour  Intake 720 ml  Output 1600 ml  Net -880 ml   Filed Weights   10/25/21 0623 10/26/21 0451  Weight: 93.3 kg 95.1 kg    Physical Examination:  General: Obese built, not in obvious distress HENT:   No scleral pallor or icterus noted. Oral mucosa is moist.  Chest:  Clear breath sounds.  Diminished breath sounds bilaterally. No crackles or wheezes.  CVS: S1 &S2 heard. No murmur.  Regular  rate and rhythm. Abdomen: Soft, nontender, nondistended.  Bowel sounds are heard.   Extremities: Right below-knee amputation, left lower extremity mild edema. Psych: Alert, awake and oriented, normal mood CNS:  No cranial nerve deficits.  Power equal in all extremities.   Skin: Warm and dry.  No rashes noted.  Data Reviewed:   CBC: Recent Labs  Lab 10/23/21 1320 10/24/21 0431 10/25/21 0422 10/26/21 0437 10/27/21 0439 10/28/21 0714  WBC 5.7 5.7 6.8 7.3 5.9 6.7  NEUTROABS 4.0  --   --   --   --   --   HGB 8.6* 7.9* 7.7* 7.8* 8.0* 8.4*  HCT 30.1* 27.2* 26.4* 26.3* 27.9* 28.8*  MCV 85.3 82.7 81.7 82.2 83.3 82.5  PLT 394 387 344 319 328 270    Basic Metabolic Panel: Recent Labs  Lab 10/25/21 0422 10/26/21 0437 10/27/21 0439 10/28/21 0443 10/29/21 0422  NA 134* 138 138 137 137  K 3.9 4.5 4.7 5.5* 4.7  CL 103 107 107 108 107  CO2 20* 22 22 20* 22  GLUCOSE 297* 254* 259* 208* 206*  BUN 36* 49* 63* 67* 72*  CREATININE 1.34* 1.43* 1.46* 1.43* 1.07*  CALCIUM 8.4* 8.5* 8.6* 8.5* 8.8*  MG 2.0 2.1  --   --   --     Liver Function  Tests: Recent Labs  Lab 10/23/21 1320  AST 13*  ALT 14  ALKPHOS 140*  BILITOT 0.5  PROT 7.1  ALBUMIN 3.1*     Radiology Studies: No results found.    LOS: 6 days    Flora Lipps, MD [facility] 10/30/2021, 11:16 AM

## 2021-10-30 NOTE — Plan of Care (Signed)

## 2021-10-31 ENCOUNTER — Ambulatory Visit: Payer: Medicaid Other | Admitting: Radiation Oncology

## 2021-10-31 ENCOUNTER — Other Ambulatory Visit: Payer: Self-pay

## 2021-10-31 ENCOUNTER — Emergency Department (HOSPITAL_COMMUNITY)
Admission: EM | Admit: 2021-10-31 | Discharge: 2021-10-31 | Disposition: A | Discharge status: Home / Self Care | Payer: Medicaid Other | Attending: Emergency Medicine | Admitting: Emergency Medicine

## 2021-10-31 ENCOUNTER — Encounter (HOSPITAL_COMMUNITY): Payer: Self-pay | Admitting: *Deleted

## 2021-10-31 ENCOUNTER — Telehealth: Payer: Self-pay | Admitting: Radiation Therapy

## 2021-10-31 ENCOUNTER — Other Ambulatory Visit: Payer: Self-pay | Admitting: Radiation Oncology

## 2021-10-31 DIAGNOSIS — F17200 Nicotine dependence, unspecified, uncomplicated: Secondary | ICD-10-CM | POA: Insufficient documentation

## 2021-10-31 DIAGNOSIS — Z85528 Personal history of other malignant neoplasm of kidney: Secondary | ICD-10-CM | POA: Insufficient documentation

## 2021-10-31 DIAGNOSIS — C649 Malignant neoplasm of unspecified kidney, except renal pelvis: Secondary | ICD-10-CM

## 2021-10-31 DIAGNOSIS — R531 Weakness: Secondary | ICD-10-CM | POA: Diagnosis present

## 2021-10-31 DIAGNOSIS — N390 Urinary tract infection, site not specified: Secondary | ICD-10-CM

## 2021-10-31 DIAGNOSIS — C7931 Secondary malignant neoplasm of brain: Secondary | ICD-10-CM

## 2021-10-31 DIAGNOSIS — D649 Anemia, unspecified: Secondary | ICD-10-CM

## 2021-10-31 HISTORY — DX: Malignant neoplasm of unspecified kidney, except renal pelvis: C64.9

## 2021-10-31 HISTORY — DX: Malignant neoplasm of brain, unspecified: C71.9

## 2021-10-31 HISTORY — DX: Malignant neoplasm of stomach, unspecified: C16.9

## 2021-10-31 LAB — URINALYSIS, ROUTINE W REFLEX MICROSCOPIC
Bilirubin Urine: NEGATIVE
Glucose, UA: NEGATIVE mg/dL
Ketones, ur: NEGATIVE mg/dL
Nitrite: NEGATIVE
Protein, ur: 100 mg/dL — AB
Specific Gravity, Urine: 1.013 (ref 1.005–1.030)
WBC, UA: >50 WBC/hpf — ABNORMAL HIGH (ref 0–5)
pH: 5 (ref 5.0–8.0)

## 2021-10-31 LAB — COMPREHENSIVE METABOLIC PANEL
ALT: 31 U/L (ref 0–44)
AST: 17 U/L (ref 15–41)
Albumin: 3.1 g/dL — ABNORMAL LOW (ref 3.5–5.0)
Alkaline Phosphatase: 160 U/L — ABNORMAL HIGH (ref 38–126)
Anion gap: 10 (ref 5–15)
BUN: 69 mg/dL — ABNORMAL HIGH (ref 6–20)
CO2: 18 mmol/L — ABNORMAL LOW (ref 22–32)
Calcium: 8.7 mg/dL — ABNORMAL LOW (ref 8.9–10.3)
Chloride: 106 mmol/L (ref 98–111)
Creatinine, Ser: 1.22 mg/dL — ABNORMAL HIGH (ref 0.44–1.00)
GFR, Estimated: 53 mL/min — ABNORMAL LOW (ref >60)
Glucose, Bld: 187 mg/dL — ABNORMAL HIGH (ref 70–99)
Potassium: 4 mmol/L (ref 3.5–5.1)
Sodium: 134 mmol/L — ABNORMAL LOW (ref 135–145)
Total Bilirubin: 0.4 mg/dL (ref 0.3–1.2)
Total Protein: 6.3 g/dL — ABNORMAL LOW (ref 6.5–8.1)

## 2021-10-31 LAB — TYPE AND SCREEN
ABO/RH(D): O POS
Antibody Screen: NEGATIVE

## 2021-10-31 LAB — CBC
HCT: 31.5 % — ABNORMAL LOW (ref 36.0–46.0)
Hemoglobin: 9 g/dL — ABNORMAL LOW (ref 12.0–15.0)
MCH: 23.9 pg — ABNORMAL LOW (ref 26.0–34.0)
MCHC: 28.6 g/dL — ABNORMAL LOW (ref 30.0–36.0)
MCV: 83.6 fL (ref 80.0–100.0)
Platelets: 385 10*3/uL (ref 150–400)
RBC: 3.77 MIL/uL — ABNORMAL LOW (ref 3.87–5.11)
RDW: 20.6 % — ABNORMAL HIGH (ref 11.5–15.5)
WBC: 9.3 10*3/uL (ref 4.0–10.5)
nRBC: 0 % (ref 0.0–0.2)

## 2021-10-31 MED ORDER — CEPHALEXIN 500 MG PO CAPS: 500.0000 mg | ORAL_CAPSULE | Freq: Three times a day (TID) | ORAL | 0 refills | Status: DC

## 2021-10-31 MED ORDER — LIDOCAINE HCL (PF) 1 % IJ SOLN
2.0000 mL | Freq: Once | INTRAMUSCULAR | Status: AC
Start: 2021-10-31 — End: 2021-10-31
  Administered 2021-10-31: 2 mL
  Filled 2021-10-31: qty 30

## 2021-10-31 MED ORDER — SODIUM CHLORIDE 0.9 % IV SOLN
1.0000 g | Freq: Once | INTRAVENOUS | Status: DC
  Filled 2021-10-31: qty 10

## 2021-10-31 MED ORDER — LORAZEPAM 1 MG PO TABS: ORAL_TABLET | ORAL | 0 refills | Status: DC

## 2021-10-31 MED ORDER — CEFTRIAXONE SODIUM 500 MG IJ SOLR
500.0000 mg | Freq: Once | INTRAMUSCULAR | Status: AC
  Administered 2021-10-31: 500 mg via INTRAMUSCULAR
  Filled 2021-10-31: qty 500

## 2021-10-31 NOTE — ED Provider Notes (Signed)
[facility] EMERGENCY DEPARTMENT Provider Note   CSN: [id number] Arrival date & time: 10/31/21  1454     History  Chief Complaint  Patient presents with   Weakness    ZORIA RAWLINSON is a 56 y.o. female.   Weakness     Home Medications Prior to Admission medications   Medication Sig Start Date End Date Taking? Authorizing Provider  acetaminophen (TYLENOL) 325 MG tablet Take 1-2 tablets (325-650 mg total) by mouth every 4 (four) hours as needed for mild pain. 09/13/19   Love, Ivan Anchors, PA-C  carvedilol (COREG) 6.25 MG tablet Take 1 tablet (6.25 mg total) by mouth 2 (two) times daily with a meal. 10/29/21   Pokhrel, Laxman, MD  Continuous Blood Gluc Sensor (DEXCOM G6 SENSOR) MISC Inject 1 Device into the skin See admin instructions. Place 1 device/sensor into the skin every 10 days, after removing the former one 10/03/21   [provider]  dexamethasone (DECADRON) 4 MG tablet Take 1 tablet (4 mg total) by mouth 3 (three) times daily for 10 days. 10/29/21 11/08/21  Pokhrel, Corrie Mckusick, MD  docusate sodium (COLACE) 100 MG capsule Take 1 capsule (100 mg total) by mouth 2 (two) times daily. Patient taking differently: Take 100 mg by mouth 2 (two) times daily as needed for mild constipation. 09/16/19   Love, Ivan Anchors, PA-C  ergocalciferol (VITAMIN D2) 1.25 MG (50000 UT) capsule Take 50,000 Units by mouth every Sunday.    [provider]  furosemide (LASIX) 40 MG tablet Take 1 tablet (40 mg total) by mouth daily. 10/29/21 11/28/21  Pokhrel, Corrie Mckusick, MD  hydrALAZINE (APRESOLINE) 25 MG tablet Take 1 tablet (25 mg total) by mouth every 8 (eight) hours. 10/29/21   Pokhrel, Corrie Mckusick, MD  hydrocortisone (ANUSOL-HC) 25 MG suppository Place 1 suppository (25 mg total) rectally 2 (two) times daily. 10/29/21   Pokhrel, Corrie Mckusick, MD  insulin aspart (NOVOLOG) 100 UNIT/ML injection Inject 100 Units into the skin See admin instructions. Up to 100 units per day in omni pod. Omnipod to be filled with  Novolog to be accurately injected by Omnipod based on settings.    [provider]  Insulin Disposable Pump (OMNIPOD DASH PODS, GEN 4,) MISC Inject into the skin continuous.    [provider]  JARDIANCE 25 MG TABS tablet Take 25 mg by mouth daily. 10/03/21   [provider]  LORazepam (ATIVAN) 1 MG tablet Take one tablet 40 minutes prior to radiation treatment.  If you do not feel calmer after 20 minutes, you can take a 2nd tablet prior to radiation treatment. Need driver while on this med. 10/31/21   Eppie Gibson, MD  Multiple Vitamin (MULTIVITAMIN WITH MINERALS) TABS tablet Take 1 tablet by mouth daily. 09/14/19   Love, Ivan Anchors, PA-C  ondansetron (ZOFRAN) 4 MG tablet Take 1 tablet (4 mg total) by mouth every 6 (six) hours as needed for nausea. 10/29/21   Pokhrel, Corrie Mckusick, MD  oxyCODONE-acetaminophen (PERCOCET/ROXICET) 5-325 MG tablet Take 1 tablet by mouth every 6 (six) hours as needed for moderate pain or severe pain. 10/29/21   Pokhrel, Corrie Mckusick, MD  pantoprazole (PROTONIX) 40 MG tablet Take 1 tablet (40 mg total) by mouth 2 (two) times daily. 10/18/21 11/17/21  Nolberto Hanlon, MD  polyethylene glycol (MIRALAX / GLYCOLAX) 17 g packet Take 17 g by mouth 2 (two) times daily. Patient taking differently: Take 17 g by mouth 2 (two) times daily as needed. 09/16/19   Love, Ivan Anchors, PA-C  simvastatin (ZOCOR) 40  MG tablet Take 40 mg by mouth at bedtime.     [provider]  sucralfate (CARAFATE) 1 GM/10ML suspension Take 10 mLs (1 g total) by mouth 4 (four) times daily -  with meals and at bedtime. 10/18/21 11/17/21  Nolberto Hanlon, MD      Allergies    Patient has no known allergies.    Review of Systems   Review of Systems  Neurological:  Positive for weakness.   Physical Exam Updated Vital Signs BP (!) 148/76    Pulse 66    Temp 97.7 F (36.5 C) (Oral)    Resp 19    Ht 1.626 m (5\' 4" )    Wt 95.1 kg    LMP 12/16/2015    SpO2 95%    BMI 35.99 kg/m  Physical Exam  ED  Results / Procedures / Treatments   Labs (all labs ordered are listed, but only abnormal results are displayed) Results for orders placed or performed during the hospital encounter of 10/31/21  Comprehensive metabolic panel  Result Value Ref Range   Sodium 134 (L) 135 - 145 mmol/L   Potassium 4.0 3.5 - 5.1 mmol/L   Chloride 106 98 - 111 mmol/L   CO2 18 (L) 22 - 32 mmol/L   Glucose, Bld 187 (H) 70 - 99 mg/dL   BUN 69 (H) 6 - 20 mg/dL   Creatinine, Ser 1.22 (H) 0.44 - 1.00 mg/dL   Calcium 8.7 (L) 8.9 - 10.3 mg/dL   Total Protein 6.3 (L) 6.5 - 8.1 g/dL   Albumin 3.1 (L) 3.5 - 5.0 g/dL   AST 17 15 - 41 U/L   ALT 31 0 - 44 U/L   Alkaline Phosphatase 160 (H) 38 - 126 U/L   Total Bilirubin 0.4 0.3 - 1.2 mg/dL   GFR, Estimated 53 (L) >60 mL/min   Anion gap 10 5 - 15  CBC  Result Value Ref Range   WBC 9.3 4.0 - 10.5 K/uL   RBC 3.77 (L) 3.87 - 5.11 MIL/uL   Hemoglobin 9.0 (L) 12.0 - 15.0 g/dL   HCT 31.5 (L) 36.0 - 46.0 %   MCV 83.6 80.0 - 100.0 fL   MCH 23.9 (L) 26.0 - 34.0 pg   MCHC 28.6 (L) 30.0 - 36.0 g/dL   RDW 20.6 (H) 11.5 - 15.5 %   Platelets 385 150 - 400 K/uL   nRBC 0.0 0.0 - 0.2 %  Urinalysis, Routine w reflex microscopic Urine, Clean Catch  Result Value Ref Range   Color, Urine YELLOW YELLOW   APPearance CLOUDY (A) CLEAR   Specific Gravity, Urine 1.013 1.005 - 1.030   pH 5.0 5.0 - 8.0   Glucose, UA NEGATIVE NEGATIVE mg/dL   Hgb urine dipstick MODERATE (A) NEGATIVE   Bilirubin Urine NEGATIVE NEGATIVE   Ketones, ur NEGATIVE NEGATIVE mg/dL   Protein, ur 100 (A) NEGATIVE mg/dL   Nitrite NEGATIVE NEGATIVE   Leukocytes,Ua LARGE (A) NEGATIVE   RBC / HPF 6-10 0 - 5 RBC/hpf   WBC, UA >50 (H) 0 - 5 WBC/hpf   Bacteria, UA FEW (A) NONE SEEN   Squamous Epithelial / LPF 0-5 0 - 5   WBC Clumps PRESENT   Type and screen  Result Value Ref Range   ABO/RH(D) O POS    Antibody Screen NEG    Sample Expiration      11/03/2021,2359 Performed at [facility], [address]  [place], [place] [address]    DG Chest 2  View  Result Date: 10/13/2021 CLINICAL DATA:  smoker. weakness. rule out infection/malignancy EXAM: CHEST - 2 VIEW COMPARISON:  April 07, 2017 FINDINGS: The cardiomediastinal silhouette is enlarged in contour.Atherosclerotic calcifications. No pleural effusion. No pneumothorax. Multiple nodular opacities throughout bilateral lungs. Surgical clips project over the upper abdomen. Suggestion of sclerosis of several vertebral bodies. IMPRESSION: 1. Multiple nodular opacities bilaterally. Suggestion of multifocal sclerosis of the vertebral bodies versus summation artifact. Findings are concerning for multifocal metastases. Recommend further dedicated evaluation with cross-sectional imaging. Electronically Signed   By: Valentino Saxon M.D.   On: 10/13/2021 15:29   CT Head Wo Contrast  Result Date: 10/24/2021 CLINICAL DATA:  Brain/CNS neoplasm to assess treatment response. EXAM: CT HEAD WITHOUT CONTRAST TECHNIQUE: Contiguous axial images were obtained from the base of the skull through the vertex without intravenous contrast. RADIATION DOSE REDUCTION: This exam was performed according to the departmental dose-optimization program which includes automated exposure control, adjustment of the mA and/or kV according to patient size and/or use of iterative reconstruction technique. COMPARISON:  MRI brain 10/19/2021.  CT head 10/13/2021 FINDINGS: Brain: Multiple intracranial metastases are again demonstrated. Largest is a heterogeneous mixed density but mostly hyperdense lesion in the left parietal lobe measuring 3 x 3.7 cm diameter. Size measures larger than on previous CT. There is surrounding vasogenic edema as before. Multiple additional mostly hyperdense lesions are also demonstrated. Lesions are more numerous and larger than on the previous CT but were demonstrated on the prior MRI. Hyperdensity in the lesions suggest hemorrhagic component. No mass effect or midline shift. No  abnormal extra-axial fluid collections. Ventricles are not dilated. Vascular: Intracranial arterial vascular calcifications are present. Skull: Normal. Negative for fracture or focal lesion. Sinuses/Orbits: Paranasal sinuses and mastoid air cells are clear. Other: None. IMPRESSION: Multiple intracranial metastases, some hemorrhagic, and associated vasogenic white matter edema demonstrating progression since prior CT, possibly due to intervening hemorrhage within the lesions. Size and distribution of the lesions is similar to the interval MRI. Greater than 10 lesions are present. No acute mass effect. Electronically Signed   By: Lucienne Capers M.D.   On: 10/24/2021 00:32   CT Head Wo Contrast  Result Date: 10/13/2021 CLINICAL DATA:  dizziness. blurred vision. EXAM: CT HEAD WITHOUT CONTRAST TECHNIQUE: Contiguous axial images were obtained from the base of the skull through the vertex without intravenous contrast. RADIATION DOSE REDUCTION: This exam was performed according to the departmental dose-optimization program which includes automated exposure control, adjustment of the mA and/or kV according to patient size and/or use of iterative reconstruction technique. COMPARISON:  February 17, 2017. FINDINGS: Brain: There is a lesion in the LEFT parieto-occipital lobe with adjacent vasogenic edema and a peripheral rim of intrinsic hyperdensity. It measures 2.6 x 2.4 by 2.7 cm. There is an additional intrinsically hyperdense region along the RIGHT frontoparietal falx which measures 7 by 5 mm which is concerning for an additional site of disease (series 2, image 23). Hyperdensity of the LEFT frontal lobe measuring approximately 5 mm likely reflects an additional site of disease (series 2, image 25). Hypodensity of the RIGHT occipital lobe with a small amount of intrinsic hyperdensity likely reflecting additional lesion (series 2, image 12; series 5, image 28). Hypodensity of the LEFT inferior cerebellum, consistent with  the sequela of remote prior infarction and similar comparison to prior. No significant midline shift. Vascular: Vascular calcifications of the carotid siphons. Skull: No acute fracture. Sinuses/Orbits: LEFT mastoid effusion. Other: None. IMPRESSION: 1. There is a 2.7 cm peripherally hyperdense  masslike area in the LEFT parieto-occipital lobe with adjacent vasogenic edema. 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 Recommend further evaluation with dedicated brain MRI with and without contrast. 2. There are likely additional lesions in the bilateral cerebral hemispheres. These results were called by telephone at the time of interpretation on 10/13/2021 at 3:20 pm to provider MARGAUX VENTER , who verbally acknowledged these results. Electronically Signed   By: Valentino Saxon M.D.   On: 10/13/2021 15:28   MR Brain W Wo Contrast  Result Date: 10/20/2021 CLINICAL DATA:  Brain metastases, unknown primary 3T SRS Protocol for treatment planning EXAM: MRI HEAD WITHOUT AND WITH CONTRAST TECHNIQUE: Multiplanar, multiecho pulse sequences of the brain and surrounding structures were obtained without and with intravenous contrast. CONTRAST:  64mL GADAVIST GADOBUTROL 1 MMOL/ML IV SOLN COMPARISON:  10/15/2021 FINDINGS: Brain: Multiple parenchymal masses are again identified. Possible dural lesion along the falx is also again noted. In the interval, there has been evolution of hemorrhage associated with the lesions that now demonstrate significant intrinsic T1 shortening including within the surrounding area of edema. Therefore, evaluation for enhancement is now limited. Greater than 10 lesions are present. A few were not visible on the prior study or are new. For example, along the body of the caudate on the right (series 1100, image 178) and several punctate lesions in the right cerebral hemisphere (images 178, 181, 192, 197, and 202). Many of these lesions demonstrate corresponding diffusion hyperintensity. There also  additional foci of diffusion hyperintensity in the frontoparietal white matter bilaterally without definite corresponding enhancement. This was also present on the prior study. The right caudate body lesion was visible on SWI on the prior study. There is trace subdural hemorrhage along the cerebral convexities posteriorly. Likely minimal sulcal subarachnoid hemorrhage associated with the dominant left parieto-occipital lesion. No significant change in mass effect. Vascular: Major vessel flow voids at the skull base are preserved. Skull and upper cervical spine: Normal marrow signal is preserved. Sinuses/Orbits: Paranasal sinuses are aerated. Orbits are unremarkable. Other: Sella is unremarkable.  Mastoid air cells are clear. IMPRESSION: Multiple (greater than 10) hemorrhagic lesions are present with evolution of intralesional hemorrhage in the interval. There is T1 hyperintensity of the perilesional edema, a finding more commonly associated with cavernous malformations, but these are presumed to reflect metastases in the setting of renal malignancy. A few of the lesions were not visible on the prior study or are new. Many of these demonstrate corresponding diffusion hyperintensity. Therefore, subacute infarcts are consideration for this subset. Electronically Signed   By: Macy Mis M.D.   On: 10/20/2021 14:05   MR BRAIN W WO CONTRAST  Result Date: 10/16/2021 CLINICAL DATA:  CNS neoplasm, [facility] protocol EXAM: MRI HEAD WITHOUT AND WITH CONTRAST TECHNIQUE: Multiplanar, multiecho pulse sequences of the brain and surrounding structures were obtained without and with intravenous contrast. CONTRAST:  16mL GADAVIST GADOBUTROL 1 MMOL/ML IV SOLN COMPARISON:  No prior MRI, correlation is made with 10/13/2021 CT head FINDINGS: Evaluation is limited by motion artifact. Brain: Multiple enhancing masses in the bilateral cerebral hemispheres, the largest of which is in the left parietooccipital lobe and measures up to  3.3 x 2.9 x 3.3 cm (AP x TR x CC) (series 9, image 92). Additional smaller lesions include a left parietal lesion that measures 0.7 x 0.5 x 0.7 cm (series 9, image 39 and series 10, image 29), a left frontal lobe lesion that measures 0.6 x 0.4 x 0.6 cm (series 9, image 139 and series 10,  image 43), a right frontal lobe lesion that measures 0.3 x 0.4 x 0.4 cm (series 9, image 50 and series 10, image 52), a right extra-axial lesion along the falx adjacent to the right parietal lobe that measures 1.0 x 0.5 x 1.0 cm (series 9, image 60 and series 10, image 23), right parietal lesion that measures 1.0 x 0.9 x 1.0 cm (series 9, image 80 and series 10, image 20), a left parietal lesion that measures 0.4 x 0.8 x 0.5 cm (series 9, image 82 and series 10, image 22), a left occipital lesion that measures 0.5 x 0.8 x 0.6 cm (series 9, image 124 and series 10, image 8), and a right occipital lesion (or possibly 2 adjacent lesions) that measures 1.2 x 1.7 x 0.9 cm (series 9, image 128 and series 10, image 8). Possible additional lesions in the right frontal lobe adjacent to the right lateral ventricle, which is faintly visualized on the coronal postcontrast sequences (series 10, image 32), measuring 0.6 x 0.5 cm, and in the atrium of the right lateral ventricle (series 11, image 26), measuring approximately 0.4 cm, best seen on the sagittal sequence. These lesions are associated with surrounding T2 hyperintense signal, likely edema, with the largest area of edema surrounding the dominant left parieto-occipital lesion. Several of these lesions are also associated with hemosiderin deposition (series 6, image 54, 76, 81, and 82). Two rounded areas of hemosiderin deposition in the left lateral ventricle atrium (series 6, image 76 and 69) are difficult to find definite enhancing lesions to correlate with on the axial sequences, given enhancement in the choroid plexus, but possible correlates are suspected on the sagittal postcontrast  sequence (series 11, image 24 and 26). No other areas of parenchymal hemorrhage. No definite midline shift. 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 No hydrocephalus or extra-axial collection. No diffusion-weighted sequence was available to evaluate for infarct given the selected protocol. Vascular: The left PCA flow void is not well visualized, however the vessel is seen on postcontrast imaging. Otherwise normal flow voids. Skull and upper cervical spine: Normal marrow signal. Sinuses/Orbits: Status post bilateral lens replacements. Otherwise negative. Other: Trace fluid in the mastoid air cells. IMPRESSION: Evaluation is limited by motion artifact. Within this limitation, multiple enhancing lesions in the bilateral cerebral hemispheres, with additional extra-axial lesion along the right parietal lobe and possible lesions in the atrium of the left lateral ventricle. Several of these lesions demonstrate associated edema and intralesional hemorrhage, including the dominant left parieto-occipital lesion. These are overall concerning for metastatic disease. Additional lesions may be present, but obscured by the motion artifact. Electronically Signed   By: Merilyn Baba M.D.   On: 10/16/2021 02:05   CT CHEST ABDOMEN PELVIS W CONTRAST  Result Date: 10/13/2021 CLINICAL DATA:  Brain metastasis of unknown primary. EXAM: CT CHEST, ABDOMEN, AND PELVIS WITH CONTRAST TECHNIQUE: Multidetector CT imaging of the chest, abdomen and pelvis was performed following the standard protocol during bolus administration of intravenous contrast. RADIATION DOSE REDUCTION: This exam was performed according to the departmental dose-optimization program which includes automated exposure control, adjustment of the mA and/or kV according to patient size and/or use of iterative reconstruction technique. CONTRAST:  37mL OMNIPAQUE IOHEXOL 300 MG/ML  SOLN COMPARISON:  None. FINDINGS: CT CHEST FINDINGS  Cardiovascular: Mild cardiomegaly. Aortic and coronary atherosclerotic calcification noted. Mediastinum/Lymph Nodes: Mild bilateral hilar lymphadenopathy is seen. No pathologically enlarged mediastinal or axillary lymph nodes are identified. Lungs/Pleura: Multiple solid bilateral pulmonary nodules are seen,  consistent with diffuse pulmonary metastases. Index nodule in the medial right lower lobe measures 2.9 x 1.7 cm on image 92/3. Index nodule in the posterolateral left lower lobe measures 4.4 x 2.2 cm on image 109/3. Small left pleural effusion also seen. Musculoskeletal: Multiple chest wall soft tissue masses are seen bilaterally, involving the breasts, chest wall muscles and subcutaneous tissues. The 2 largest masses involve the right pectoralis major muscle, measuring 3.3 x 2.9 cm on image 12/2, and the right supraspinatus muscle measuring 5.7 x 3.2 cm on image 21/2. CT ABDOMEN AND PELVIS FINDINGS Hepatobiliary: Multiple hypovascular masses are seen throughout the right and left hepatic lobes, largest in the lateral dome of the right hepatic lobe measuring 10.7 x 5.1 cm. These are consistent diffuse liver metastases. Prior cholecystectomy. No evidence of biliary obstruction. Pancreas:  No mass or inflammatory changes. Spleen:  Within normal limits in size and appearance. Adrenals/Urinary tract: A small right adrenal mass is seen measuring 2.0 x 1.5 cm, which is indeterminate but suspicious for adrenal metastasis. A large heterogeneously enhancing mass is seen involving the posterior left kidney, which shows invasion of the posterior abdominal wall soft tissues. This measures 10.3 x 8.9 cm, and is consistent with primary renal cell carcinoma. Smaller enhancing soft tissue nodules are also seen in the left posterior pararenal space, consistent with metastatic disease. Two small masses are seen along the capsular surface of the anterior mid and lower poles of the right kidney, which measure 2.2 cm and 1.5 cm in  diameter. These could represent synchronous primary renal cell carcinomas of the right kidney, or metastatic disease. Two soft tissue nodules are involve the lateral portion of Gerota's fascia on the right, largest measuring 2.2 x 1.6 cm. A subcapsular cyst measuring 4 cm is also seen in the lateral interpolar region of the right kidney. Stomach/Bowel: No evidence of obstruction, inflammatory process, or abnormal fluid collections. Vascular/Lymphatic: Mild lymphadenopathy is seen in the left paraaortic region, largest measuring 10 mm on image 67/2. A 1.3 cm lymph node is seen in the deep left abdominal small bowel mesentery adjacent to the duodenum. No pelvic lymphadenopathy identified. Aortic atherosclerotic calcification noted. Left retroaortic renal vein is noted, however there is no evidence of renal vein or IVC thrombus. Reproductive: No masses identified. Small amount of gas noted within the endometrial cavity uterus. Other:  None. Musculoskeletal: No suspicious bone lesions identified. Multiple small masses are seen scattered throughout the abdominal wall soft tissues, consistent with metastatic disease. IMPRESSION: 10 cm left renal mass with invasion of the left posterior abdominal wall soft tissues, consistent with primary renal cell carcinoma. Adjacent metastatic soft tissue nodules also noted within the left perinephric space. Diffuse bilateral pulmonary metastases. Two small masses along the capsular surface of the right kidney, which may represent synchronous primary renal cell carcinomas or metastatic disease. Metastatic disease also involving Gerota's fascia on the right. Mild abdominal lymphadenopathy, consistent with metastatic disease. Diffuse liver metastases. Small right adrenal mass, suspicious for metastatic disease. Diffuse bilateral pulmonary metastases. 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 Electronically Signed   By: Marlaine Hind  M.D.   On: 10/13/2021 17:53   ECHOCARDIOGRAM COMPLETE  Result Date: 10/25/2021    ECHOCARDIOGRAM REPORT   Patient Name:   MELAH EBLING Date of Exam: 10/25/2021 Medical Rec #:  [id number]       Height:       64.0 in Accession #:    [id number]  Weight:       205.7 lb Date of Birth:  04/29/1966      BSA:          1.980 m Patient Age:    89 years        BP:           143/61 mmHg Patient Gender: F               HR:           77 bpm. Exam Location:  Inpatient Procedure: 2D Echo Indications:    Acute diastolic CHF  History:        Patient has prior history of Echocardiogram examinations, most                 recent 10/29/2016. CHF; Risk Factors:Diabetes and Hypertension.  Sonographer:    Arlyss Gandy Referring Phys: [id number] [place]  1. Left ventricular ejection fraction, by estimation, is 55 to 60%. The left ventricle has normal function. The left ventricle has no regional wall motion abnormalities. Left ventricular diastolic parameters are consistent with Grade III diastolic dysfunction (restrictive). Elevated left ventricular end-diastolic pressure.  2. Right ventricular systolic function is normal. The right ventricular size is mildly enlarged. There is severely elevated pulmonary artery systolic pressure. The estimated right ventricular systolic pressure is 46.5 mmHg.  3. Left atrial size was moderately dilated.  4. Right atrial size was mildly dilated.  5. The mitral valve is grossly normal. Mild to moderate mitral valve regurgitation.  6. Tricuspid valve regurgitation is mild to moderate.  7. The aortic valve is tricuspid. There is mild calcification of the aortic valve. There is mild thickening of the aortic valve. Aortic valve regurgitation is not visualized. Aortic valve sclerosis/calcification is present, without any evidence of aortic stenosis.  8. Aortic aortic arch not well visualized.  9. The inferior vena cava is dilated in size with <50% respiratory variability, suggesting right  atrial pressure of 15 mmHg. Comparison(s): Changes from prior study are noted. Severely elevated pulmonary pressures on current study. FINDINGS  Left Ventricle: Left ventricular ejection fraction, by estimation, is 55 to 60%. The left ventricle has normal function. The left ventricle has no regional wall motion abnormalities. The left ventricular internal cavity size was normal in size. There is  borderline left ventricular hypertrophy. Left ventricular diastolic parameters are consistent with Grade III diastolic dysfunction (restrictive). Elevated left ventricular end-diastolic pressure. Right Ventricle: The right ventricular size is mildly enlarged. Right vetricular wall thickness was not well visualized. Right ventricular systolic function is normal. There is severely elevated pulmonary artery systolic pressure. The tricuspid regurgitant velocity is 3.64 m/s, and with an assumed right atrial pressure of 15 mmHg, the estimated right ventricular systolic pressure is 03.5 mmHg. Left Atrium: Left atrial size was moderately dilated. Right Atrium: Right atrial size was mildly dilated. Pericardium: There is no evidence of pericardial effusion. Mitral Valve: The mitral valve is grossly normal. There is mild thickening of the mitral valve leaflet(s). There is mild calcification of the mitral valve leaflet(s). Mild to moderate mitral annular calcification. Mild to moderate mitral valve regurgitation. MV peak gradient, 11.0 mmHg. The mean mitral valve gradient is 4.0 mmHg. Tricuspid Valve: The tricuspid valve is grossly normal. Tricuspid valve regurgitation is mild to moderate. No evidence of tricuspid stenosis. Aortic Valve: The aortic valve is tricuspid. There is mild calcification of the aortic valve. There is mild thickening of the aortic valve. Aortic valve regurgitation is not visualized. Aortic valve  sclerosis/calcification is present, without any evidence of aortic stenosis. Aortic valve mean gradient measures 9.5  mmHg. Aortic valve peak gradient measures 16.6 mmHg. Aortic valve area, by VTI measures 1.50 cm. Pulmonic Valve: The pulmonic valve was not well visualized. Pulmonic valve regurgitation is not visualized. No evidence of pulmonic stenosis. Aorta: The aortic root, ascending aorta, aortic arch and descending aorta are all structurally normal, with no evidence of dilitation or obstruction and aortic arch not well visualized. Venous: The inferior vena cava is dilated in size with less than 50% respiratory variability, suggesting right atrial pressure of 15 mmHg. IAS/Shunts: The atrial septum is grossly normal.  LEFT VENTRICLE PLAX 2D LVIDd:         5.10 cm   Diastology LVIDs:         3.50 cm   LV e' medial:    6.53 cm/s LV PW:         1.10 cm   LV E/e' medial:  23.9 LV IVS:        1.10 cm   LV e' lateral:   8.59 cm/s LVOT diam:     2.00 cm   LV E/e' lateral: 18.2 LV SV:         78 LV SV Index:   40 LVOT Area:     3.14 cm  RIGHT VENTRICLE             IVC RV Basal diam:  4.10 cm     IVC diam: 2.50 cm RV Mid diam:    3.20 cm RV S prime:     11.60 cm/s TAPSE (M-mode): 1.8 cm LEFT ATRIUM             Index        RIGHT ATRIUM           Index LA diam:        4.20 cm 2.12 cm/m   RA Area:     17.30 cm LA Vol (A2C):   76.3 ml 38.54 ml/m  RA Volume:   44.50 ml  22.48 ml/m LA Vol (A4C):   61.1 ml 30.86 ml/m LA Biplane Vol: 68.5 ml 34.60 ml/m  AORTIC VALVE AV Area (Vmax):    1.51 cm AV Area (Vmean):   1.47 cm AV Area (VTI):     1.50 cm AV Vmax:           204.00 cm/s AV Vmean:          144.000 cm/s AV VTI:            0.522 m AV Peak Grad:      16.6 mmHg AV Mean Grad:      9.5 mmHg LVOT Vmax:         97.90 cm/s LVOT Vmean:        67.500 cm/s LVOT VTI:          0.249 m LVOT/AV VTI ratio: 0.48  AORTA Ao Asc diam: 2.90 cm MITRAL VALVE                TRICUSPID VALVE MV Area (PHT): 3.42 cm     TR Peak grad:   53.0 mmHg MV Area VTI:   1.96 cm     TR Vmax:        364.00 cm/s MV Peak grad:  11.0 mmHg MV Mean grad:  4.0 mmHg      SHUNTS MV Vmax:       1.66 m/s     Systemic VTI:  0.25  m MV Vmean:      85.4 cm/s    Systemic Diam: 2.00 cm MV Decel Time: 222 msec MV E velocity: 156.00 cm/s MV A velocity: 71.00 cm/s MV E/A ratio:  2.20 Buford Dresser MD Electronically signed by Buford Dresser MD Signature Date/Time: 10/25/2021/8:05:43 PM    Final    [place] CORE BIOPSY (SOFT TISSUE)  Result Date: 10/17/2021 INDICATION: 56 year old female with history of left renal mass and multiple diffuse metastatic lesions. EXAM: Ultrasound-guided soft tissue mass biopsy MEDICATIONS: None. ANESTHESIA/SEDATION: Moderate (conscious) sedation was employed during this procedure. A total of Versed 1 mg and Fentanyl 25 mcg was administered intravenously. Moderate Sedation Time: 8 minutes. The patient's level of consciousness and vital signs were monitored continuously by radiology nursing throughout the procedure under my direct supervision. FLUOROSCOPY TIME:  None. COMPLICATIONS: None immediate. PROCEDURE: Informed written consent was obtained from the patient after a thorough discussion of the procedural risks, benefits and alternatives. All questions were addressed. Maximal Sterile Barrier Technique was utilized including caps, mask, sterile gowns, sterile gloves, sterile drape, hand hygiene and skin antiseptic. A timeout was performed prior to the initiation of the procedure. Preprocedure ultrasound evaluation of the right posterior flank demonstrates subcutaneous hypoechoic soft tissue nodule measuring approximately 2.0 x 1.0 cm. Procedure was planned. Subdermal Local anesthesia was provided at the planned needle entry site. Deeper local anesthetic was administered under ultrasound guidance to the periphery of the targeted mass. A small skin nick was made. Under direct ultrasound visualization, a 17 gauge introducer needle was directed to the periphery of the mass. Next, a total of 3, 18 gauge core biopsies were obtained and placed in formalin. The  introducer needle was removed. Postprocedure image demonstrated no evidence of surrounding hematoma or other procedure related complication. The patient tolerated the procedure well and was transferred back to the floor in stable condition. IMPRESSION: Technically successful ultrasound-guided core needle biopsy of right posterior flank subcutaneous soft tissue mass. Ruthann Cancer, MD [facility] Electronically Signed   By: Ruthann Cancer M.D.   On: 10/17/2021 12:02   VAS [place] LOWER EXTREMITY VENOUS (DVT) (7a-7p)  Result Date: 10/24/2021  Lower Venous DVT Study Patient Name:  SHAELIN LALLEY  Date of Exam:   10/23/2021 Medical Rec #: [id number]        Accession #:    [id number] Date of Birth: Jul 31, 1966       Patient Gender: F Patient Age:   76 years Exam Location:  [facility] Procedure:      VAS [place] LOWER EXTREMITY VENOUS (DVT) Referring Phys: Wille Glaser KNAPP --------------------------------------------------------------------------------  Indications: Swelling of left lower extremity.  Limitations: Poor ultrasound/tissue interface. Comparison Study: 07-17-2017 Prior bilateral lower extremity venous was negative                   for DVT. Performing Technologist: Darlin Coco RDMS, RVT  Examination Guidelines: A complete evaluation includes B-mode imaging, spectral Doppler, color Doppler, and power Doppler as needed of all accessible portions of each vessel. Bilateral testing is considered an integral part of a complete examination. Limited examinations for reoccurring indications may be performed as noted. The reflux portion of the exam is performed with the patient in reverse Trendelenburg.  +-----+---------------+---------+-----------+----------+--------------+  RIGHT Compressibility Phasicity Spontaneity Properties Thrombus Aging  +-----+---------------+---------+-----------+----------+--------------+  CFV   Full            Yes       Yes                                     +-----+---------------+---------+-----------+----------+--------------+   +---------+---------------+---------+-----------+----------+-------------------+  LEFT      Compressibility Phasicity Spontaneity Properties Thrombus Aging       +---------+---------------+---------+-----------+----------+-------------------+  CFV       Full            Yes       Yes                                         +---------+---------------+---------+-----------+----------+-------------------+  SFJ       Full                                                                  +---------+---------------+---------+-----------+----------+-------------------+  FV Prox   Full                                                                  +---------+---------------+---------+-----------+----------+-------------------+  FV Mid    Full                                                                  +---------+---------------+---------+-----------+----------+-------------------+  FV Distal Full                                                                  +---------+---------------+---------+-----------+----------+-------------------+  PFV       Full                                                                  +---------+---------------+---------+-----------+----------+-------------------+  POP       Full            Yes       Yes                                         +---------+---------------+---------+-----------+----------+-------------------+  PTV                                                        Not well visualized  +---------+---------------+---------+-----------+----------+-------------------+  PERO  Not well visualized  +---------+---------------+---------+-----------+----------+-------------------+    Summary: RIGHT: - No evidence of common femoral vein obstruction.  LEFT: - There is no evidence of deep vein thrombosis in the lower extremity. However,  portions of this examination were limited- see technologist comments above.  - No cystic structure found in the popliteal fossa.  *See table(s) above for measurements and observations. Electronically signed by Harold Barban MD on 10/24/2021 at 8:53:54 PM.    Final     EKG None  Radiology No results found.  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 Labs: ordered. Decision-making details documented in ED Course.  Risk Prescription drug management. Decision regarding hospitalization.   Labs sent.  Continuous pulse ox and cardiac monitor. Discussed/considered disposition decision including potential admission given significant chr illness/general weakness - will get lab results and reassess.   Cardiac monitor: sinus rhythm, rate 66.  Reviewed nursing notes and prior charts for additional history.  Recent d/c summary reviewed/external reports reviewed.   Labs reviewed/interpreted by me - k normal, wbc normal. Hgb c/w prior. Ua c/w uti. Rocephin iv.   SW consulted - discussed pt - SW indicates very familiar with pts case and that during recent admission pt did not qualify for SNF, rec outpt f/u.   Discussed dispo w pt, pt indicates prefers to go  home. Indicates has some home health/help coming tomorrow.   Po fluids/food provided.   Vitals are normal, and patient appears in no acute distress. Pt currently appears stable for d/c.   Rec close pcp f/u.  Return precautions provided.         Final Clinical Impression(s) / ED Diagnoses Final diagnoses:  None    Rx / DC Orders ED Discharge Orders     None         Lajean Saver, MD 10/31/21 1925

## 2021-10-31 NOTE — ED Notes (Signed)
Patient drinking PO fluids at this time.

## 2021-10-31 NOTE — Discharge Instructions (Addendum)
It was our pleasure to provide your ER care today - we hope that you feel better.  Drink plenty of fluids/stay well hydrated. Get adequate nutrition.   Take antibiotic as prescribed for possible urine infection.   Follow up closely with primary care doctor in the coming week - call doctors office tomorrow AM to help coordinate/maximize home health services and possible palliative care evaluation, and office follow up.  Follow up with home health help tomorrow as planned.   Return to ER if worse, new symptoms, fevers, new/severe pain, persistent vomiting, chest pain, trouble breathing, or other concern.

## 2021-10-31 NOTE — ED Triage Notes (Addendum)
Pt brought in by [facility] from home with c/o weakness since December, but worsening over the last 2 days. Pt was supposed to have radiation today and [facility] came out to transport pt. [facility] is not able to go into pt's room to help assist them to their [place]. Pt was unable to get outside on her own so EMS was called for weakness. Pt reports she has been able to transfer on her own to a wheelchair until the last 2 days when she became weak. [facility] for EMS.

## 2021-10-31 NOTE — Telephone Encounter (Signed)
Spoke with pt and left a voicemail for daughter about the prescription for Ativan being called into her pharmacy, [facility] in [place]. Ms. Goonan understands that she will need to take this medication 40 min prior to her scheduled treatment time.   I also asked how she will be getting here for treatment today since she is unable to stand or bear weight on her own. She said that transportation was set up by the social worker prior to discharge.   Mont Dutton R.T.(R)(T) Radiation Special Procedures Navigator

## 2021-10-31 NOTE — ED Notes (Signed)
Pt's Radiation Therapist from [facility] called to update [place] on pt's status. She reports pt was admitted to [facility] 2/7-2/14. Pt came to the ED then because she was too weak to transfer to a wheelchair at home to go to her initial appt at [facility] on 2/7. She reports SNF placement was recommended, but pt ended up going home instead for reason she was unsure of. She is concerned pt is unable to care for herself properly at home and obviously is not able to attend outpatient appts for her cancer treatments.

## 2021-10-31 NOTE — ED Provider Notes (Signed)
[facility] EMERGENCY DEPARTMENT Provider Note   CSN: [id number] Arrival date & time: 10/31/21  1454     History  Chief Complaint  Patient presents with   Weakness    Natalie Contreras is a 56 y.o. female.  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 Two prior admits in past two months for similar symptoms. Transport arrived today to take her to first radiation appt but patient too weak to get into wheelchair, so EMS was called to bring to ED. Pt c/o general weakness. No focal or unilateral numbness/weakness. Denies severe headache. No change in speech or vision. No chest pain or discomfort. No sob. No abd pain or nvd. No dysuria. No fever or chills. Denies trauma/fall.   The history is provided by the patient, medical records and the EMS personnel.  Weakness Associated symptoms: no abdominal pain, no chest pain, no cough, no diarrhea, no dysuria, no fever, no headaches, no shortness of breath and no vomiting       Home Medications Prior to Admission medications   Medication Sig Start Date End Date Taking? Authorizing Provider  acetaminophen (TYLENOL) 325 MG tablet Take 1-2 tablets (325-650 mg total) by mouth every 4 (four) hours as needed for mild pain. 09/13/19   Love, Ivan Anchors, PA-C  carvedilol (COREG) 6.25 MG tablet Take 1 tablet (6.25 mg total) by mouth 2 (two) times daily with a meal. 10/29/21   Pokhrel, Laxman, MD  Continuous Blood Gluc Sensor (DEXCOM G6 SENSOR) MISC Inject 1 Device into the skin See admin instructions. Place 1 device/sensor into the skin every 10 days, after removing the former one 10/03/21   [provider]  dexamethasone (DECADRON) 4 MG tablet Take 1 tablet (4 mg total) by mouth 3 (three) times daily for 10 days. 10/29/21 11/08/21  Pokhrel, Corrie Mckusick, MD  docusate sodium (COLACE) 100 MG capsule Take 1 capsule (100 mg total) by mouth 2 (two) times daily. Patient taking differently: Take 100 mg by mouth 2 (two) times daily as  needed for mild constipation. 09/16/19   Love, Ivan Anchors, PA-C  ergocalciferol (VITAMIN D2) 1.25 MG (50000 UT) capsule Take 50,000 Units by mouth every Sunday.    [provider]  furosemide (LASIX) 40 MG tablet Take 1 tablet (40 mg total) by mouth daily. 10/29/21 11/28/21  Pokhrel, Corrie Mckusick, MD  hydrALAZINE (APRESOLINE) 25 MG tablet Take 1 tablet (25 mg total) by mouth every 8 (eight) hours. 10/29/21   Pokhrel, Corrie Mckusick, MD  hydrocortisone (ANUSOL-HC) 25 MG suppository Place 1 suppository (25 mg total) rectally 2 (two) times daily. 10/29/21   Pokhrel, Corrie Mckusick, MD  insulin aspart (NOVOLOG) 100 UNIT/ML injection Inject 100 Units into the skin See admin instructions. Up to 100 units per day in omni pod. Omnipod to be filled with Novolog to be accurately injected by Omnipod based on settings.    [provider]  Insulin Disposable Pump (OMNIPOD DASH PODS, GEN 4,) MISC Inject into the skin continuous.    [provider]  JARDIANCE 25 MG TABS tablet Take 25 mg by mouth daily. 10/03/21   [provider]  LORazepam (ATIVAN) 1 MG tablet Take one tablet 40 minutes prior to radiation treatment.  If you do not feel calmer after 20 minutes, you can take a 2nd tablet prior to radiation treatment. Need driver while on this med. 10/31/21   Eppie Gibson, MD  Multiple Vitamin (MULTIVITAMIN WITH MINERALS) TABS tablet Take 1 tablet by mouth daily. 09/14/19  Love, Pamela S, PA-C  ondansetron (ZOFRAN) 4 MG tablet Take 1 tablet (4 mg total) by mouth every 6 (six) hours as needed for nausea. 10/29/21   Pokhrel, Corrie Mckusick, MD  oxyCODONE-acetaminophen (PERCOCET/ROXICET) 5-325 MG tablet Take 1 tablet by mouth every 6 (six) hours as needed for moderate pain or severe pain. 10/29/21   Pokhrel, Corrie Mckusick, MD  pantoprazole (PROTONIX) 40 MG tablet Take 1 tablet (40 mg total) by mouth 2 (two) times daily. 10/18/21 11/17/21  Nolberto Hanlon, MD  polyethylene glycol (MIRALAX / GLYCOLAX) 17 g packet Take 17 g by mouth 2 (two)  times daily. Patient taking differently: Take 17 g by mouth 2 (two) times daily as needed. 09/16/19   Love, Ivan Anchors, PA-C  simvastatin (ZOCOR) 40 MG tablet Take 40 mg by mouth at bedtime.     [provider]  sucralfate (CARAFATE) 1 GM/10ML suspension Take 10 mLs (1 g total) by mouth 4 (four) times daily -  with meals and at bedtime. 10/18/21 11/17/21  Nolberto Hanlon, MD      Allergies    Patient has no known allergies.    Review of Systems   Review of Systems  Constitutional:  Negative for chills and fever.  HENT:  Negative for sore throat.   Eyes:  Negative for redness.  Respiratory:  Negative for cough and shortness of breath.   Cardiovascular:  Negative for chest pain.  Gastrointestinal:  Negative for abdominal pain, blood in stool, diarrhea and vomiting.  Genitourinary:  Negative for dysuria and flank pain.  Musculoskeletal:  Negative for back pain and neck pain.  Skin:  Negative for rash.  Neurological:  Positive for weakness. Negative for headaches.  Hematological:  Does not bruise/bleed easily.  Psychiatric/Behavioral:  Negative for agitation.    Physical Exam Updated Vital Signs BP (!) 148/49 (BP Location: Left Arm)    Pulse 73    Temp 97.7 F (36.5 C) (Oral)    Resp 20    Ht 1.626 m (5\' 4" )    Wt 95.1 kg    LMP 12/16/2015    SpO2 94%    BMI 35.99 kg/m  Physical Exam Vitals and nursing note reviewed.  Constitutional:      Appearance: Normal appearance. She is well-developed.  HENT:     Head: Atraumatic.     Nose: Nose normal.     Mouth/Throat:     Mouth: Mucous membranes are moist.  Eyes:     General: No scleral icterus.    Conjunctiva/sclera: Conjunctivae normal.     Pupils: Pupils are equal, round, and reactive to light.  Neck:     Vascular: No carotid bruit.     Trachea: No tracheal deviation.     Comments: No stiffness or rigidity.  Cardiovascular:     Rate and Rhythm: Normal rate and regular rhythm.     Pulses: Normal pulses.     Heart sounds: Normal  heart sounds. No murmur heard.   No friction rub. No gallop.  Pulmonary:     Effort: Pulmonary effort is normal. No respiratory distress.     Breath sounds: Normal breath sounds.  Abdominal:     General: Bowel sounds are normal. There is no distension.     Palpations: Abdomen is soft.     Tenderness: There is no abdominal tenderness. There is no guarding.  Genitourinary:    Comments: No cva tenderness.  Musculoskeletal:        General: No swelling or tenderness.     Cervical  back: Normal range of motion and neck supple. No rigidity. No muscular tenderness.     Right lower leg: No edema.     Left lower leg: No edema.  Skin:    General: Skin is warm and dry.     Coloration: Skin is pale.     Findings: No rash.  Neurological:     Mental Status: She is alert.     Comments: Alert, speech normal. Motor/sens grossly intact bil.   Psychiatric:        Mood and Affect: Mood normal.    ED Results / Procedures / Treatments   Labs (all labs ordered are listed, but only abnormal results are displayed) Results for orders placed or performed during the hospital encounter of 10/31/21  Comprehensive metabolic panel  Result Value Ref Range   Sodium 134 (L) 135 - 145 mmol/L   Potassium 4.0 3.5 - 5.1 mmol/L   Chloride 106 98 - 111 mmol/L   CO2 18 (L) 22 - 32 mmol/L   Glucose, Bld 187 (H) 70 - 99 mg/dL   BUN 69 (H) 6 - 20 mg/dL   Creatinine, Ser 1.22 (H) 0.44 - 1.00 mg/dL   Calcium 8.7 (L) 8.9 - 10.3 mg/dL   Total Protein 6.3 (L) 6.5 - 8.1 g/dL   Albumin 3.1 (L) 3.5 - 5.0 g/dL   AST 17 15 - 41 U/L   ALT 31 0 - 44 U/L   Alkaline Phosphatase 160 (H) 38 - 126 U/L   Total Bilirubin 0.4 0.3 - 1.2 mg/dL   GFR, Estimated 53 (L) >60 mL/min   Anion gap 10 5 - 15  CBC  Result Value Ref Range   WBC 9.3 4.0 - 10.5 K/uL   RBC 3.77 (L) 3.87 - 5.11 MIL/uL   Hemoglobin 9.0 (L) 12.0 - 15.0 g/dL   HCT 31.5 (L) 36.0 - 46.0 %   MCV 83.6 80.0 - 100.0 fL   MCH 23.9 (L) 26.0 - 34.0 pg   MCHC 28.6 (L)  30.0 - 36.0 g/dL   RDW 20.6 (H) 11.5 - 15.5 %   Platelets 385 150 - 400 K/uL   nRBC 0.0 0.0 - 0.2 %  Urinalysis, Routine w reflex microscopic Urine, Clean Catch  Result Value Ref Range   Color, Urine YELLOW YELLOW   APPearance CLOUDY (A) CLEAR   Specific Gravity, Urine 1.013 1.005 - 1.030   pH 5.0 5.0 - 8.0   Glucose, UA NEGATIVE NEGATIVE mg/dL   Hgb urine dipstick MODERATE (A) NEGATIVE   Bilirubin Urine NEGATIVE NEGATIVE   Ketones, ur NEGATIVE NEGATIVE mg/dL   Protein, ur 100 (A) NEGATIVE mg/dL   Nitrite NEGATIVE NEGATIVE   Leukocytes,Ua LARGE (A) NEGATIVE   RBC / HPF 6-10 0 - 5 RBC/hpf   WBC, UA >50 (H) 0 - 5 WBC/hpf   Bacteria, UA FEW (A) NONE SEEN   Squamous Epithelial / LPF 0-5 0 - 5   WBC Clumps PRESENT   Type and screen  Result Value Ref Range   ABO/RH(D) O POS    Antibody Screen NEG    Sample Expiration      11/03/2021,2359 Performed at [facility], [address], [place],  [address]    DG Chest 2 View  Result Date: 10/13/2021 CLINICAL DATA:  smoker. weakness. rule out infection/malignancy EXAM: CHEST - 2 VIEW COMPARISON:  April 07, 2017 FINDINGS: The cardiomediastinal silhouette is enlarged in contour.Atherosclerotic calcifications. No pleural effusion. No pneumothorax. Multiple nodular opacities throughout bilateral lungs. Surgical  clips project over the upper abdomen. Suggestion of sclerosis of several vertebral bodies. IMPRESSION: 1. Multiple nodular opacities bilaterally. Suggestion of multifocal sclerosis of the vertebral bodies versus summation artifact. Findings are concerning for multifocal metastases. Recommend further dedicated evaluation with cross-sectional imaging. Electronically Signed   By: Valentino Saxon M.D.   On: 10/13/2021 15:29   CT Head Wo Contrast  Result Date: 10/24/2021 CLINICAL DATA:  Brain/CNS neoplasm to assess treatment response. EXAM: CT HEAD WITHOUT CONTRAST TECHNIQUE: Contiguous axial images were obtained from the base of the  skull through the vertex without intravenous contrast. RADIATION DOSE REDUCTION: This exam was performed according to the departmental dose-optimization program which includes automated exposure control, adjustment of the mA and/or kV according to patient size and/or use of iterative reconstruction technique. COMPARISON:  MRI brain 10/19/2021.  CT head 10/13/2021 FINDINGS: Brain: Multiple intracranial metastases are again demonstrated. Largest is a heterogeneous mixed density but mostly hyperdense lesion in the left parietal lobe measuring 3 x 3.7 cm diameter. Size measures larger than on previous CT. There is surrounding vasogenic edema as before. Multiple additional mostly hyperdense lesions are also demonstrated. Lesions are more numerous and larger than on the previous CT but were demonstrated on the prior MRI. Hyperdensity in the lesions suggest hemorrhagic component. No mass effect or midline shift. No abnormal extra-axial fluid collections. Ventricles are not dilated. Vascular: Intracranial arterial vascular calcifications are present. Skull: Normal. Negative for fracture or focal lesion. Sinuses/Orbits: Paranasal sinuses and mastoid air cells are clear. Other: None. IMPRESSION: Multiple intracranial metastases, some hemorrhagic, and associated vasogenic white matter edema demonstrating progression since prior CT, possibly due to intervening hemorrhage within the lesions. Size and distribution of the lesions is similar to the interval MRI. Greater than 10 lesions are present. No acute mass effect. Electronically Signed   By: Lucienne Capers M.D.   On: 10/24/2021 00:32   CT Head Wo Contrast  Result Date: 10/13/2021 CLINICAL DATA:  dizziness. blurred vision. EXAM: CT HEAD WITHOUT CONTRAST TECHNIQUE: Contiguous axial images were obtained from the base of the skull through the vertex without intravenous contrast. RADIATION DOSE REDUCTION: This exam was performed according to the departmental  dose-optimization program which includes automated exposure control, adjustment of the mA and/or kV according to patient size and/or use of iterative reconstruction technique. COMPARISON:  February 17, 2017. FINDINGS: Brain: There is a lesion in the LEFT parieto-occipital lobe with adjacent vasogenic edema and a peripheral rim of intrinsic hyperdensity. It measures 2.6 x 2.4 by 2.7 cm. There is an additional intrinsically hyperdense region along the RIGHT frontoparietal falx which measures 7 by 5 mm which is concerning for an additional site of disease (series 2, image 23). Hyperdensity of the LEFT frontal lobe measuring approximately 5 mm likely reflects an additional site of disease (series 2, image 25). Hypodensity of the RIGHT occipital lobe with a small amount of intrinsic hyperdensity likely reflecting additional lesion (series 2, image 12; series 5, image 28). Hypodensity of the LEFT inferior cerebellum, consistent with the sequela of remote prior infarction and similar comparison to prior. No significant midline shift. Vascular: Vascular calcifications of the carotid siphons. Skull: No acute fracture. Sinuses/Orbits: LEFT mastoid effusion. Other: None. IMPRESSION: 1. There is a 2.7 cm peripherally hyperdense masslike area in the LEFT parieto-occipital lobe with adjacent vasogenic edema. 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 Recommend further evaluation with dedicated brain MRI with and without contrast. 2. There are likely additional lesions in the bilateral cerebral hemispheres. These results were called by  telephone at the time of interpretation on 10/13/2021 at 3:20 pm to provider MARGAUX VENTER , who verbally acknowledged these results. Electronically Signed   By: Valentino Saxon M.D.   On: 10/13/2021 15:28   MR Brain W Wo Contrast  Result Date: 10/20/2021 CLINICAL DATA:  Brain metastases, unknown primary 3T SRS Protocol for treatment planning EXAM: MRI HEAD WITHOUT AND WITH CONTRAST TECHNIQUE:  Multiplanar, multiecho pulse sequences of the brain and surrounding structures were obtained without and with intravenous contrast. CONTRAST:  83mL GADAVIST GADOBUTROL 1 MMOL/ML IV SOLN COMPARISON:  10/15/2021 FINDINGS: Brain: Multiple parenchymal masses are again identified. Possible dural lesion along the falx is also again noted. In the interval, there has been evolution of hemorrhage associated with the lesions that now demonstrate significant intrinsic T1 shortening including within the surrounding area of edema. Therefore, evaluation for enhancement is now limited. Greater than 10 lesions are present. A few were not visible on the prior study or are new. For example, along the body of the caudate on the right (series 1100, image 178) and several punctate lesions in the right cerebral hemisphere (images 178, 181, 192, 197, and 202). Many of these lesions demonstrate corresponding diffusion hyperintensity. There also additional foci of diffusion hyperintensity in the frontoparietal white matter bilaterally without definite corresponding enhancement. This was also present on the prior study. The right caudate body lesion was visible on SWI on the prior study. There is trace subdural hemorrhage along the cerebral convexities posteriorly. Likely minimal sulcal subarachnoid hemorrhage associated with the dominant left parieto-occipital lesion. No significant change in mass effect. Vascular: Major vessel flow voids at the skull base are preserved. Skull and upper cervical spine: Normal marrow signal is preserved. Sinuses/Orbits: Paranasal sinuses are aerated. Orbits are unremarkable. Other: Sella is unremarkable.  Mastoid air cells are clear. IMPRESSION: Multiple (greater than 10) hemorrhagic lesions are present with evolution of intralesional hemorrhage in the interval. There is T1 hyperintensity of the perilesional edema, a finding more commonly associated with cavernous malformations, but these are presumed to  reflect metastases in the setting of renal malignancy. A few of the lesions were not visible on the prior study or are new. Many of these demonstrate corresponding diffusion hyperintensity. Therefore, subacute infarcts are consideration for this subset. Electronically Signed   By: Macy Mis M.D.   On: 10/20/2021 14:05   MR BRAIN W WO CONTRAST  Result Date: 10/16/2021 CLINICAL DATA:  CNS neoplasm, [facility] protocol EXAM: MRI HEAD WITHOUT AND WITH CONTRAST TECHNIQUE: Multiplanar, multiecho pulse sequences of the brain and surrounding structures were obtained without and with intravenous contrast. CONTRAST:  59mL GADAVIST GADOBUTROL 1 MMOL/ML IV SOLN COMPARISON:  No prior MRI, correlation is made with 10/13/2021 CT head FINDINGS: Evaluation is limited by motion artifact. Brain: Multiple enhancing masses in the bilateral cerebral hemispheres, the largest of which is in the left parietooccipital lobe and measures up to 3.3 x 2.9 x 3.3 cm (AP x TR x CC) (series 9, image 92). Additional smaller lesions include a left parietal lesion that measures 0.7 x 0.5 x 0.7 cm (series 9, image 39 and series 10, image 29), a left frontal lobe lesion that measures 0.6 x 0.4 x 0.6 cm (series 9, image 139 and series 10, image 43), a right frontal lobe lesion that measures 0.3 x 0.4 x 0.4 cm (series 9, image 50 and series 10, image 52), a right extra-axial lesion along the falx adjacent to the right parietal lobe that measures 1.0 x 0.5 x 1.0  cm (series 9, image 60 and series 10, image 23), right parietal lesion that measures 1.0 x 0.9 x 1.0 cm (series 9, image 80 and series 10, image 20), a left parietal lesion that measures 0.4 x 0.8 x 0.5 cm (series 9, image 82 and series 10, image 22), a left occipital lesion that measures 0.5 x 0.8 x 0.6 cm (series 9, image 124 and series 10, image 8), and a right occipital lesion (or possibly 2 adjacent lesions) that measures 1.2 x 1.7 x 0.9 cm (series 9, image 128 and series 10, image 8).  Possible additional lesions in the right frontal lobe adjacent to the right lateral ventricle, which is faintly visualized on the coronal postcontrast sequences (series 10, image 32), measuring 0.6 x 0.5 cm, and in the atrium of the right lateral ventricle (series 11, image 26), measuring approximately 0.4 cm, best seen on the sagittal sequence. These lesions are associated with surrounding T2 hyperintense signal, likely edema, with the largest area of edema surrounding the dominant left parieto-occipital lesion. Several of these lesions are also associated with hemosiderin deposition (series 6, image 54, 76, 81, and 82). Two rounded areas of hemosiderin deposition in the left lateral ventricle atrium (series 6, image 76 and 69) are difficult to find definite enhancing lesions to correlate with on the axial sequences, given enhancement in the choroid plexus, but possible correlates are suspected on the sagittal postcontrast sequence (series 11, image 24 and 26). No other areas of parenchymal hemorrhage. No definite midline shift. 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 No hydrocephalus or extra-axial collection. No diffusion-weighted sequence was available to evaluate for infarct given the selected protocol. Vascular: The left PCA flow void is not well visualized, however the vessel is seen on postcontrast imaging. Otherwise normal flow voids. Skull and upper cervical spine: Normal marrow signal. Sinuses/Orbits: Status post bilateral lens replacements. Otherwise negative. Other: Trace fluid in the mastoid air cells. IMPRESSION: Evaluation is limited by motion artifact. Within this limitation, multiple enhancing lesions in the bilateral cerebral hemispheres, with additional extra-axial lesion along the right parietal lobe and possible lesions in the atrium of the left lateral ventricle. Several of these lesions demonstrate associated edema and intralesional hemorrhage, including the  dominant left parieto-occipital lesion. These are overall concerning for metastatic disease. Additional lesions may be present, but obscured by the motion artifact. Electronically Signed   By: Merilyn Baba M.D.   On: 10/16/2021 02:05   CT CHEST ABDOMEN PELVIS W CONTRAST  Result Date: 10/13/2021 CLINICAL DATA:  Brain metastasis of unknown primary. EXAM: CT CHEST, ABDOMEN, AND PELVIS WITH CONTRAST TECHNIQUE: Multidetector CT imaging of the chest, abdomen and pelvis was performed following the standard protocol during bolus administration of intravenous contrast. RADIATION DOSE REDUCTION: This exam was performed according to the departmental dose-optimization program which includes automated exposure control, adjustment of the mA and/or kV according to patient size and/or use of iterative reconstruction technique. CONTRAST:  79mL OMNIPAQUE IOHEXOL 300 MG/ML  SOLN COMPARISON:  None. FINDINGS: CT CHEST FINDINGS Cardiovascular: Mild cardiomegaly. Aortic and coronary atherosclerotic calcification noted. Mediastinum/Lymph Nodes: Mild bilateral hilar lymphadenopathy is seen. No pathologically enlarged mediastinal or axillary lymph nodes are identified. Lungs/Pleura: Multiple solid bilateral pulmonary nodules are seen, consistent with diffuse pulmonary metastases. Index nodule in the medial right lower lobe measures 2.9 x 1.7 cm on image 92/3. Index nodule in the posterolateral left lower lobe measures 4.4 x 2.2 cm on image 109/3. Small left pleural effusion also seen. Musculoskeletal:  Multiple chest wall soft tissue masses are seen bilaterally, involving the breasts, chest wall muscles and subcutaneous tissues. The 2 largest masses involve the right pectoralis major muscle, measuring 3.3 x 2.9 cm on image 12/2, and the right supraspinatus muscle measuring 5.7 x 3.2 cm on image 21/2. CT ABDOMEN AND PELVIS FINDINGS Hepatobiliary: Multiple hypovascular masses are seen throughout the right and left hepatic lobes, largest in  the lateral dome of the right hepatic lobe measuring 10.7 x 5.1 cm. These are consistent diffuse liver metastases. Prior cholecystectomy. No evidence of biliary obstruction. Pancreas:  No mass or inflammatory changes. Spleen:  Within normal limits in size and appearance. Adrenals/Urinary tract: A small right adrenal mass is seen measuring 2.0 x 1.5 cm, which is indeterminate but suspicious for adrenal metastasis. A large heterogeneously enhancing mass is seen involving the posterior left kidney, which shows invasion of the posterior abdominal wall soft tissues. This measures 10.3 x 8.9 cm, and is consistent with primary renal cell carcinoma. Smaller enhancing soft tissue nodules are also seen in the left posterior pararenal space, consistent with metastatic disease. Two small masses are seen along the capsular surface of the anterior mid and lower poles of the right kidney, which measure 2.2 cm and 1.5 cm in diameter. These could represent synchronous primary renal cell carcinomas of the right kidney, or metastatic disease. Two soft tissue nodules are involve the lateral portion of Gerota's fascia on the right, largest measuring 2.2 x 1.6 cm. A subcapsular cyst measuring 4 cm is also seen in the lateral interpolar region of the right kidney. Stomach/Bowel: No evidence of obstruction, inflammatory process, or abnormal fluid collections. Vascular/Lymphatic: Mild lymphadenopathy is seen in the left paraaortic region, largest measuring 10 mm on image 67/2. A 1.3 cm lymph node is seen in the deep left abdominal small bowel mesentery adjacent to the duodenum. No pelvic lymphadenopathy identified. Aortic atherosclerotic calcification noted. Left retroaortic renal vein is noted, however there is no evidence of renal vein or IVC thrombus. Reproductive: No masses identified. Small amount of gas noted within the endometrial cavity uterus. Other:  None. Musculoskeletal: No suspicious bone lesions identified. Multiple small  masses are seen scattered throughout the abdominal wall soft tissues, consistent with metastatic disease. IMPRESSION: 10 cm left renal mass with invasion of the left posterior abdominal wall soft tissues, consistent with primary renal cell carcinoma. Adjacent metastatic soft tissue nodules also noted within the left perinephric space. Diffuse bilateral pulmonary metastases. Two small masses along the capsular surface of the right kidney, which may represent synchronous primary renal cell carcinomas or metastatic disease. Metastatic disease also involving Gerota's fascia on the right. Mild abdominal lymphadenopathy, consistent with metastatic disease. Diffuse liver metastases. Small right adrenal mass, suspicious for metastatic disease. Diffuse bilateral pulmonary metastases. 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 Electronically Signed   By: Marlaine Hind M.D.   On: 10/13/2021 17:53   ECHOCARDIOGRAM COMPLETE  Result Date: 10/25/2021    ECHOCARDIOGRAM REPORT   Patient Name:   Natalie Contreras Date of Exam: 10/25/2021 Medical Rec #:  [id number]       Height:       64.0 in Accession #:    [id number]      Weight:       205.7 lb Date of Birth:  1966/04/30      BSA:          1.980 m Patient Age:    52 years  BP:           143/61 mmHg Patient Gender: F               HR:           77 bpm. Exam Location:  Inpatient Procedure: 2D Echo Indications:    Acute diastolic CHF  History:        Patient has prior history of Echocardiogram examinations, most                 recent 10/29/2016. CHF; Risk Factors:Diabetes and Hypertension.  Sonographer:    Arlyss Gandy Referring Phys: [id number]  1. Left ventricular ejection fraction, by estimation, is 55 to 60%. The left ventricle has normal function. The left ventricle has no regional wall motion abnormalities. Left ventricular diastolic parameters are consistent with Grade III diastolic dysfunction  (restrictive). Elevated left ventricular end-diastolic pressure.  2. Right ventricular systolic function is normal. The right ventricular size is mildly enlarged. There is severely elevated pulmonary artery systolic pressure. The estimated right ventricular systolic pressure is 04.8 mmHg.  3. Left atrial size was moderately dilated.  4. Right atrial size was mildly dilated.  5. The mitral valve is grossly normal. Mild to moderate mitral valve regurgitation.  6. Tricuspid valve regurgitation is mild to moderate.  7. The aortic valve is tricuspid. There is mild calcification of the aortic valve. There is mild thickening of the aortic valve. Aortic valve regurgitation is not visualized. Aortic valve sclerosis/calcification is present, without any evidence of aortic stenosis.  8. Aortic aortic arch not well visualized.  9. The inferior vena cava is dilated in size with <50% respiratory variability, suggesting right atrial pressure of 15 mmHg. Comparison(s): Changes from prior study are noted. Severely elevated pulmonary pressures on current study. FINDINGS  Left Ventricle: Left ventricular ejection fraction, by estimation, is 55 to 60%. The left ventricle has normal function. The left ventricle has no regional wall motion abnormalities. The left ventricular internal cavity size was normal in size. There is  borderline left ventricular hypertrophy. Left ventricular diastolic parameters are consistent with Grade III diastolic dysfunction (restrictive). Elevated left ventricular end-diastolic pressure. Right Ventricle: The right ventricular size is mildly enlarged. Right vetricular wall thickness was not well visualized. Right ventricular systolic function is normal. There is severely elevated pulmonary artery systolic pressure. The tricuspid regurgitant velocity is 3.64 m/s, and with an assumed right atrial pressure of 15 mmHg, the estimated right ventricular systolic pressure is 88.9 mmHg. Left Atrium: Left atrial size  was moderately dilated. Right Atrium: Right atrial size was mildly dilated. Pericardium: There is no evidence of pericardial effusion. Mitral Valve: The mitral valve is grossly normal. There is mild thickening of the mitral valve leaflet(s). There is mild calcification of the mitral valve leaflet(s). Mild to moderate mitral annular calcification. Mild to moderate mitral valve regurgitation. MV peak gradient, 11.0 mmHg. The mean mitral valve gradient is 4.0 mmHg. Tricuspid Valve: The tricuspid valve is grossly normal. Tricuspid valve regurgitation is mild to moderate. No evidence of tricuspid stenosis. Aortic Valve: The aortic valve is tricuspid. There is mild calcification of the aortic valve. There is mild thickening of the aortic valve. Aortic valve regurgitation is not visualized. Aortic valve sclerosis/calcification is present, without any evidence of aortic stenosis. Aortic valve mean gradient measures 9.5 mmHg. Aortic valve peak gradient measures 16.6 mmHg. Aortic valve area, by VTI measures 1.50 cm. Pulmonic Valve: The pulmonic valve was not well visualized. Pulmonic valve regurgitation is  not visualized. No evidence of pulmonic stenosis. Aorta: The aortic root, ascending aorta, aortic arch and descending aorta are all structurally normal, with no evidence of dilitation or obstruction and aortic arch not well visualized. Venous: The inferior vena cava is dilated in size with less than 50% respiratory variability, suggesting right atrial pressure of 15 mmHg. IAS/Shunts: The atrial septum is grossly normal.  LEFT VENTRICLE PLAX 2D LVIDd:         5.10 cm   Diastology LVIDs:         3.50 cm   LV e' medial:    6.53 cm/s LV PW:         1.10 cm   LV E/e' medial:  23.9 LV IVS:        1.10 cm   LV e' lateral:   8.59 cm/s LVOT diam:     2.00 cm   LV E/e' lateral: 18.2 LV SV:         78 LV SV Index:   40 LVOT Area:     3.14 cm  RIGHT VENTRICLE             IVC RV Basal diam:  4.10 cm     IVC diam: 2.50 cm RV Mid diam:     3.20 cm RV S prime:     11.60 cm/s TAPSE (M-mode): 1.8 cm LEFT ATRIUM             Index        RIGHT ATRIUM           Index LA diam:        4.20 cm 2.12 cm/m   RA Area:     17.30 cm LA Vol (A2C):   76.3 ml 38.54 ml/m  RA Volume:   44.50 ml  22.48 ml/m LA Vol (A4C):   61.1 ml 30.86 ml/m LA Biplane Vol: 68.5 ml 34.60 ml/m  AORTIC VALVE AV Area (Vmax):    1.51 cm AV Area (Vmean):   1.47 cm AV Area (VTI):     1.50 cm AV Vmax:           204.00 cm/s AV Vmean:          144.000 cm/s AV VTI:            0.522 m AV Peak Grad:      16.6 mmHg AV Mean Grad:      9.5 mmHg LVOT Vmax:         97.90 cm/s LVOT Vmean:        67.500 cm/s LVOT VTI:          0.249 m LVOT/AV VTI ratio: 0.48  AORTA Ao Asc diam: 2.90 cm MITRAL VALVE                TRICUSPID VALVE MV Area (PHT): 3.42 cm     TR Peak grad:   53.0 mmHg MV Area VTI:   1.96 cm     TR Vmax:        364.00 cm/s MV Peak grad:  11.0 mmHg MV Mean grad:  4.0 mmHg     SHUNTS MV Vmax:       1.66 m/s     Systemic VTI:  0.25 m MV Vmean:      85.4 cm/s    Systemic Diam: 2.00 cm MV Decel Time: 222 msec MV E velocity: 156.00 cm/s MV A velocity: 71.00 cm/s MV E/A ratio:  2.20 Buford Dresser MD Electronically signed by Buford Dresser  MD Signature Date/Time: 10/25/2021/8:05:43 PM    Final    [place] CORE BIOPSY (SOFT TISSUE)  Result Date: 10/17/2021 INDICATION: 56 year old female with history of left renal mass and multiple diffuse metastatic lesions. EXAM: Ultrasound-guided soft tissue mass biopsy MEDICATIONS: None. ANESTHESIA/SEDATION: Moderate (conscious) sedation was employed during this procedure. A total of Versed 1 mg and Fentanyl 25 mcg was administered intravenously. Moderate Sedation Time: 8 minutes. The patient's level of consciousness and vital signs were monitored continuously by radiology nursing throughout the procedure under my direct supervision. FLUOROSCOPY TIME:  None. COMPLICATIONS: None immediate. PROCEDURE: Informed written consent was obtained from the  patient after a thorough discussion of the procedural risks, benefits and alternatives. All questions were addressed. Maximal Sterile Barrier Technique was utilized including caps, mask, sterile gowns, sterile gloves, sterile drape, hand hygiene and skin antiseptic. A timeout was performed prior to the initiation of the procedure. Preprocedure ultrasound evaluation of the right posterior flank demonstrates subcutaneous hypoechoic soft tissue nodule measuring approximately 2.0 x 1.0 cm. Procedure was planned. Subdermal Local anesthesia was provided at the planned needle entry site. Deeper local anesthetic was administered under ultrasound guidance to the periphery of the targeted mass. A small skin nick was made. Under direct ultrasound visualization, a 17 gauge introducer needle was directed to the periphery of the mass. Next, a total of 3, 18 gauge core biopsies were obtained and placed in formalin. The introducer needle was removed. Postprocedure image demonstrated no evidence of surrounding hematoma or other procedure related complication. The patient tolerated the procedure well and was transferred back to the floor in stable condition. IMPRESSION: Technically successful ultrasound-guided core needle biopsy of right posterior flank subcutaneous soft tissue mass. Ruthann Cancer, MD [facility] Radiology Electronically Signed   By: Ruthann Cancer M.D.   On: 10/17/2021 12:02   VAS [place] LOWER EXTREMITY VENOUS (DVT) (7a-7p)  Result Date: 10/24/2021  Lower Venous DVT Study Patient Name:  Natalie Contreras  Date of Exam:   10/23/2021 Medical Rec #: [id number]        Accession #:    [id number] Date of Birth: 1966/06/19       Patient Gender: F Patient Age:   59 years Exam Location:  [facility] Procedure:      VAS [place] LOWER EXTREMITY VENOUS (DVT) Referring Phys: Wille Glaser KNAPP --------------------------------------------------------------------------------  Indications:  Swelling of left lower extremity.  Limitations: Poor ultrasound/tissue interface. Comparison Study: 07-17-2017 Prior bilateral lower extremity venous was negative                   for DVT. Performing Technologist: Darlin Coco RDMS, RVT  Examination Guidelines: A complete evaluation includes B-mode imaging, spectral Doppler, color Doppler, and power Doppler as needed of all accessible portions of each vessel. Bilateral testing is considered an integral part of a complete examination. Limited examinations for reoccurring indications may be performed as noted. The reflux portion of the exam is performed with the patient in reverse Trendelenburg.  +-----+---------------+---------+-----------+----------+--------------+  RIGHT Compressibility Phasicity Spontaneity Properties Thrombus Aging  +-----+---------------+---------+-----------+----------+--------------+  CFV   Full            Yes       Yes                                    +-----+---------------+---------+-----------+----------+--------------+   +---------+---------------+---------+-----------+----------+-------------------+  LEFT      Compressibility Phasicity Spontaneity Properties Thrombus  Aging       +---------+---------------+---------+-----------+----------+-------------------+  CFV       Full            Yes       Yes                                         +---------+---------------+---------+-----------+----------+-------------------+  SFJ       Full                                                                  +---------+---------------+---------+-----------+----------+-------------------+  FV Prox   Full                                                                  +---------+---------------+---------+-----------+----------+-------------------+  FV Mid    Full                                                                  +---------+---------------+---------+-----------+----------+-------------------+  FV Distal Full                                                                   +---------+---------------+---------+-----------+----------+-------------------+  PFV       Full                                                                  +---------+---------------+---------+-----------+----------+-------------------+  POP       Full            Yes       Yes                                         +---------+---------------+---------+-----------+----------+-------------------+  PTV                                                        Not well visualized  +---------+---------------+---------+-----------+----------+-------------------+  PERO  Not well visualized  +---------+---------------+---------+-----------+----------+-------------------+    Summary: RIGHT: - No evidence of common femoral vein obstruction.  LEFT: - There is no evidence of deep vein thrombosis in the lower extremity. However, portions of this examination were limited- see technologist comments above.  - No cystic structure found in the popliteal fossa.  *See table(s) above for measurements and observations. Electronically signed by Harold Barban MD on 10/24/2021 at 8:53:54 PM.    Final      EKG None  Radiology No results found.  Procedures Procedures    Medications Ordered in ED Medications - No data to display  ED Course/ Medical Decision Making/ A&P                           Medical Decision Making Amount and/or Complexity of Data Reviewed Labs: ordered.  Risk Prescription drug management.  Iv ns. Continuous pulse ox and cardiac monitor. Stat labs.   Reviewed nursing notes and prior charts for additional history. External reports reviewed. Additional hx by EMS.   Labs reviewed/interpreted by me - wbc normal. K normal. Ua c/w uti.   Rocephin iv.   Recent ct reviewed/interpreted by me - hem mets.   Consulted/discussed w SW - indicates not able to place or arrange significant HH on prior visits due  to insurance/denials.   Discussed disposition w pt, considered admission vs d/c.  Pt indicates has home health/help coming tomorrow and feels able to be d/c to home.    Rec close pcp/heme onc f/u.  Return precautions provided.           Final Clinical Impression(s) / ED Diagnoses Final diagnoses:  None    Rx / DC Orders ED Discharge Orders     None         Lajean Saver, MD 11/04/21 1515

## 2021-10-31 NOTE — ED Notes (Signed)
CSW updated both MD and RN that pt was at [facility] from 2/7-2/14. SNF was recommended at that time, SNF referral was sent out and there were no bed offers. TOC also attempted to set up [facility] services however there were no companies willing to accept pt. Per TOC transition note from 2/14 pt was contacting [facility] for personal care services. Pt also uses [facility] for transportation to radiation treatments. Pt states at that time on 2/14 that she was able to manage in the home.

## 2021-11-01 ENCOUNTER — Emergency Department (HOSPITAL_COMMUNITY): Payer: Medicaid Other

## 2021-11-01 ENCOUNTER — Inpatient Hospital Stay (HOSPITAL_COMMUNITY)
Admission: EM | Admit: 2021-11-01 | Discharge: 2021-11-05 | DRG: 951 | Disposition: A | Discharge status: Hospice | Payer: Medicaid Other | Source: Ambulatory Visit | Attending: Internal Medicine | Admitting: Internal Medicine

## 2021-11-01 ENCOUNTER — Encounter: Payer: Self-pay | Admitting: Licensed Clinical Social Worker

## 2021-11-01 ENCOUNTER — Inpatient Hospital Stay (HOSPITAL_BASED_OUTPATIENT_CLINIC_OR_DEPARTMENT_OTHER): Payer: Medicaid Other | Admitting: Nurse Practitioner

## 2021-11-01 ENCOUNTER — Encounter (HOSPITAL_COMMUNITY): Payer: Self-pay | Admitting: Oncology

## 2021-11-01 ENCOUNTER — Telehealth: Payer: Self-pay | Admitting: *Deleted

## 2021-11-01 ENCOUNTER — Encounter: Payer: Self-pay | Admitting: Nurse Practitioner

## 2021-11-01 ENCOUNTER — Other Ambulatory Visit: Payer: Self-pay

## 2021-11-01 ENCOUNTER — Ambulatory Visit
Admission: RE | Admit: 2021-11-01 | Discharge: 2021-11-01 | Disposition: A | Discharge status: Home / Self Care | Payer: Medicaid Other | Source: Ambulatory Visit | Attending: Radiation Oncology | Admitting: Radiation Oncology

## 2021-11-01 DIAGNOSIS — F17210 Nicotine dependence, cigarettes, uncomplicated: Secondary | ICD-10-CM

## 2021-11-01 DIAGNOSIS — N1832 Chronic kidney disease, stage 3b: Secondary | ICD-10-CM | POA: Diagnosis present

## 2021-11-01 DIAGNOSIS — Z794 Long term (current) use of insulin: Secondary | ICD-10-CM

## 2021-11-01 DIAGNOSIS — C7801 Secondary malignant neoplasm of right lung: Secondary | ICD-10-CM | POA: Diagnosis not present

## 2021-11-01 DIAGNOSIS — C7802 Secondary malignant neoplasm of left lung: Secondary | ICD-10-CM | POA: Diagnosis present

## 2021-11-01 DIAGNOSIS — C7989 Secondary malignant neoplasm of other specified sites: Secondary | ICD-10-CM | POA: Diagnosis present

## 2021-11-01 DIAGNOSIS — Z7984 Long term (current) use of oral hypoglycemic drugs: Secondary | ICD-10-CM

## 2021-11-01 DIAGNOSIS — C7931 Secondary malignant neoplasm of brain: Secondary | ICD-10-CM

## 2021-11-01 DIAGNOSIS — Z89511 Acquired absence of right leg below knee: Secondary | ICD-10-CM

## 2021-11-01 DIAGNOSIS — Z801 Family history of malignant neoplasm of trachea, bronchus and lung: Secondary | ICD-10-CM

## 2021-11-01 DIAGNOSIS — R197 Diarrhea, unspecified: Secondary | ICD-10-CM

## 2021-11-01 DIAGNOSIS — R54 Age-related physical debility: Secondary | ICD-10-CM | POA: Diagnosis present

## 2021-11-01 DIAGNOSIS — D649 Anemia, unspecified: Secondary | ICD-10-CM | POA: Diagnosis present

## 2021-11-01 DIAGNOSIS — Z89519 Acquired absence of unspecified leg below knee: Secondary | ICD-10-CM

## 2021-11-01 DIAGNOSIS — Z515 Encounter for palliative care: Secondary | ICD-10-CM

## 2021-11-01 DIAGNOSIS — E1151 Type 2 diabetes mellitus with diabetic peripheral angiopathy without gangrene: Secondary | ICD-10-CM | POA: Diagnosis present

## 2021-11-01 DIAGNOSIS — I10 Essential (primary) hypertension: Secondary | ICD-10-CM | POA: Diagnosis present

## 2021-11-01 DIAGNOSIS — E119 Type 2 diabetes mellitus without complications: Secondary | ICD-10-CM

## 2021-11-01 DIAGNOSIS — E1140 Type 2 diabetes mellitus with diabetic neuropathy, unspecified: Secondary | ICD-10-CM | POA: Diagnosis present

## 2021-11-01 DIAGNOSIS — C787 Secondary malignant neoplasm of liver and intrahepatic bile duct: Secondary | ICD-10-CM | POA: Diagnosis not present

## 2021-11-01 DIAGNOSIS — Z66 Do not resuscitate: Secondary | ICD-10-CM

## 2021-11-01 DIAGNOSIS — C649 Malignant neoplasm of unspecified kidney, except renal pelvis: Secondary | ICD-10-CM

## 2021-11-01 DIAGNOSIS — C799 Secondary malignant neoplasm of unspecified site: Secondary | ICD-10-CM

## 2021-11-01 DIAGNOSIS — E785 Hyperlipidemia, unspecified: Secondary | ICD-10-CM | POA: Diagnosis present

## 2021-11-01 DIAGNOSIS — R630 Anorexia: Secondary | ICD-10-CM

## 2021-11-01 DIAGNOSIS — E1122 Type 2 diabetes mellitus with diabetic chronic kidney disease: Secondary | ICD-10-CM | POA: Diagnosis present

## 2021-11-01 DIAGNOSIS — R531 Weakness: Secondary | ICD-10-CM

## 2021-11-01 DIAGNOSIS — I5032 Chronic diastolic (congestive) heart failure: Secondary | ICD-10-CM | POA: Diagnosis present

## 2021-11-01 DIAGNOSIS — Z20822 Contact with and (suspected) exposure to covid-19: Secondary | ICD-10-CM | POA: Diagnosis present

## 2021-11-01 DIAGNOSIS — R262 Difficulty in walking, not elsewhere classified: Secondary | ICD-10-CM | POA: Diagnosis not present

## 2021-11-01 DIAGNOSIS — C642 Malignant neoplasm of left kidney, except renal pelvis: Secondary | ICD-10-CM | POA: Diagnosis present

## 2021-11-01 DIAGNOSIS — Z7189 Other specified counseling: Secondary | ICD-10-CM

## 2021-11-01 DIAGNOSIS — Z79899 Other long term (current) drug therapy: Secondary | ICD-10-CM

## 2021-11-01 DIAGNOSIS — G893 Neoplasm related pain (acute) (chronic): Secondary | ICD-10-CM

## 2021-11-01 DIAGNOSIS — C7889 Secondary malignant neoplasm of other digestive organs: Secondary | ICD-10-CM | POA: Diagnosis present

## 2021-11-01 DIAGNOSIS — E669 Obesity, unspecified: Secondary | ICD-10-CM | POA: Diagnosis present

## 2021-11-01 DIAGNOSIS — Z6835 Body mass index (BMI) 35.0-35.9, adult: Secondary | ICD-10-CM

## 2021-11-01 DIAGNOSIS — N390 Urinary tract infection, site not specified: Secondary | ICD-10-CM | POA: Diagnosis not present

## 2021-11-01 DIAGNOSIS — E43 Unspecified severe protein-calorie malnutrition: Secondary | ICD-10-CM | POA: Diagnosis present

## 2021-11-01 DIAGNOSIS — N3000 Acute cystitis without hematuria: Secondary | ICD-10-CM

## 2021-11-01 DIAGNOSIS — I130 Hypertensive heart and chronic kidney disease with heart failure and stage 1 through stage 4 chronic kidney disease, or unspecified chronic kidney disease: Secondary | ICD-10-CM | POA: Diagnosis present

## 2021-11-01 LAB — RETICULOCYTES
Immature Retic Fract: 17.6 % — ABNORMAL HIGH (ref 2.3–15.9)
RBC.: 3.19 MIL/uL — ABNORMAL LOW (ref 3.87–5.11)
Retic Count, Absolute: 72.7 10*3/uL (ref 19.0–186.0)
Retic Ct Pct: 2.3 % (ref 0.4–3.1)

## 2021-11-01 LAB — IRON AND TIBC
Iron: 16 ug/dL — ABNORMAL LOW (ref 28–170)
Saturation Ratios: 7 % — ABNORMAL LOW (ref 10.4–31.8)
TIBC: 230 ug/dL — ABNORMAL LOW (ref 250–450)
UIBC: 214 ug/dL

## 2021-11-01 LAB — CBC WITH DIFFERENTIAL/PLATELET
Abs Immature Granulocytes: 0.04 10*3/uL (ref 0.00–0.07)
Basophils Absolute: 0 10*3/uL (ref 0.0–0.1)
Basophils Relative: 0 %
Eosinophils Absolute: 0 10*3/uL (ref 0.0–0.5)
Eosinophils Relative: 0 %
HCT: 30.4 % — ABNORMAL LOW (ref 36.0–46.0)
Hemoglobin: 8.8 g/dL — ABNORMAL LOW (ref 12.0–15.0)
Immature Granulocytes: 1 %
Lymphocytes Relative: 17 %
Lymphs Abs: 1.3 10*3/uL (ref 0.7–4.0)
MCH: 24 pg — ABNORMAL LOW (ref 26.0–34.0)
MCHC: 28.9 g/dL — ABNORMAL LOW (ref 30.0–36.0)
MCV: 83.1 fL (ref 80.0–100.0)
Monocytes Absolute: 0.8 10*3/uL (ref 0.1–1.0)
Monocytes Relative: 10 %
Neutro Abs: 5.7 10*3/uL (ref 1.7–7.7)
Neutrophils Relative %: 72 %
Platelets: 346 10*3/uL (ref 150–400)
RBC: 3.66 MIL/uL — ABNORMAL LOW (ref 3.87–5.11)
RDW: 20.9 % — ABNORMAL HIGH (ref 11.5–15.5)
WBC: 7.8 10*3/uL (ref 4.0–10.5)
nRBC: 0 % (ref 0.0–0.2)

## 2021-11-01 LAB — COMPREHENSIVE METABOLIC PANEL
ALT: 31 U/L (ref 0–44)
AST: 15 U/L (ref 15–41)
Albumin: 2.7 g/dL — ABNORMAL LOW (ref 3.5–5.0)
Alkaline Phosphatase: 139 U/L — ABNORMAL HIGH (ref 38–126)
Anion gap: 8 (ref 5–15)
BUN: 69 mg/dL — ABNORMAL HIGH (ref 6–20)
CO2: 23 mmol/L (ref 22–32)
Calcium: 8.6 mg/dL — ABNORMAL LOW (ref 8.9–10.3)
Chloride: 108 mmol/L (ref 98–111)
Creatinine, Ser: 1.18 mg/dL — ABNORMAL HIGH (ref 0.44–1.00)
GFR, Estimated: 55 mL/min — ABNORMAL LOW (ref >60)
Glucose, Bld: 170 mg/dL — ABNORMAL HIGH (ref 70–99)
Potassium: 3.9 mmol/L (ref 3.5–5.1)
Sodium: 139 mmol/L (ref 135–145)
Total Bilirubin: 0.4 mg/dL (ref 0.3–1.2)
Total Protein: 5.8 g/dL — ABNORMAL LOW (ref 6.5–8.1)

## 2021-11-01 LAB — TSH: TSH: 4.533 u[IU]/mL — ABNORMAL HIGH (ref 0.350–4.500)

## 2021-11-01 LAB — MAGNESIUM: Magnesium: 2.3 mg/dL (ref 1.7–2.4)

## 2021-11-01 LAB — PHOSPHORUS: Phosphorus: 3.8 mg/dL (ref 2.5–4.6)

## 2021-11-01 LAB — CK: Total CK: 12 U/L — ABNORMAL LOW (ref 38–234)

## 2021-11-01 LAB — FOLATE: Folate: 4.8 ng/mL — ABNORMAL LOW (ref >5.9)

## 2021-11-01 LAB — CBG MONITORING, ED: Glucose-Capillary: 167 mg/dL — ABNORMAL HIGH (ref 70–99)

## 2021-11-01 LAB — RESP PANEL BY RT-PCR (FLU A&B, COVID) ARPGX2
Influenza A by PCR: NEGATIVE
Influenza B by PCR: NEGATIVE
SARS Coronavirus 2 by RT PCR: NEGATIVE

## 2021-11-01 LAB — VITAMIN B12: Vitamin B-12: 428 pg/mL (ref 180–914)

## 2021-11-01 LAB — FERRITIN: Ferritin: 356 ng/mL — ABNORMAL HIGH (ref 11–307)

## 2021-11-01 IMAGING — DX DG CHEST 1V PORT
1 series · 1 of 1 positions shown · non-contrast
Comparison: [DATE]

CLINICAL DATA: Increased confusion and weakness.

EXAM:
PORTABLE CHEST 1 VIEW

[chest ap]
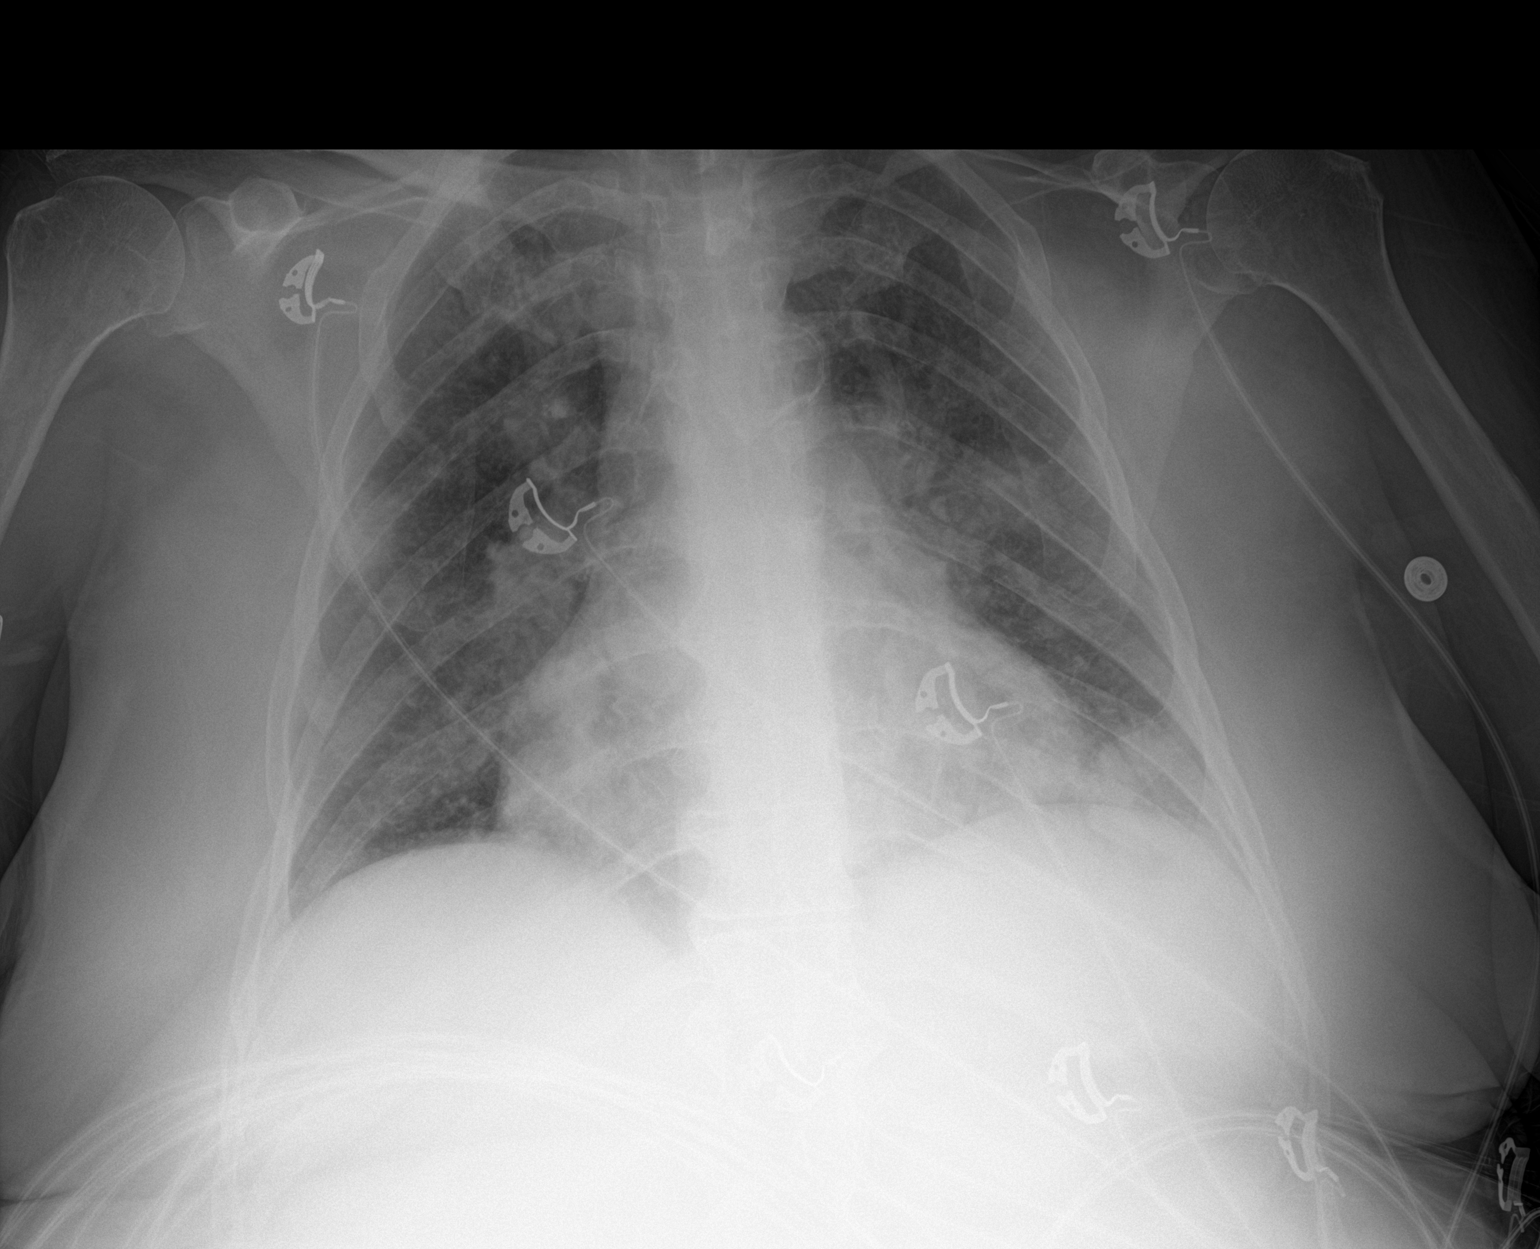

[1 of 1 positions shown; findings below may reference images not displayed]

FINDINGS: Bilateral pulmonary nodules again noted, similar to prior chest
x-ray better characterized on CT chest [DATE]. The cardio
pericardial silhouette is enlarged. There is pulmonary vascular
congestion without overt pulmonary edema. No evidence for
pneumothorax or pleural effusion. Telemetry leads overlie the chest.
IMPRESSION: 1. Pulmonary vascular congestion without overt pulmonary edema.
2. Stable bilateral pulmonary nodules.

## 2021-11-01 IMAGING — CT CT HEAD W/O CM
3 series · 14 of 47 positions shown, 16 images · non-contrast
Comparison: [DATE], [DATE]

CLINICAL DATA: Altered level of consciousness, increasing confusion
weakness, history of metastatic cancer



[Series 2: head wo · axial · 0.47mm/px · z∈[-102,+33]mm · 8 of 33 slices shown, 10 images]
[im 3/33  brain]
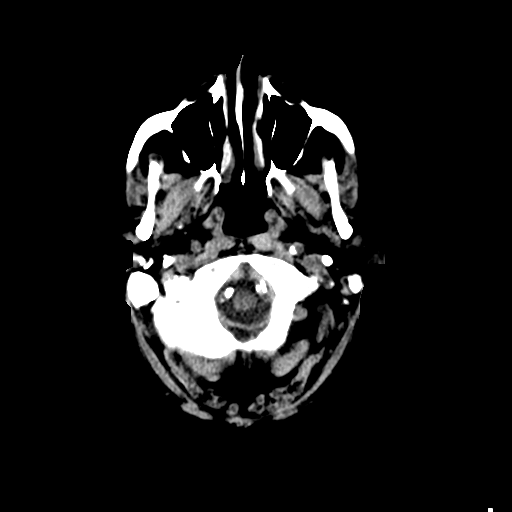
[im 3/33  bone]
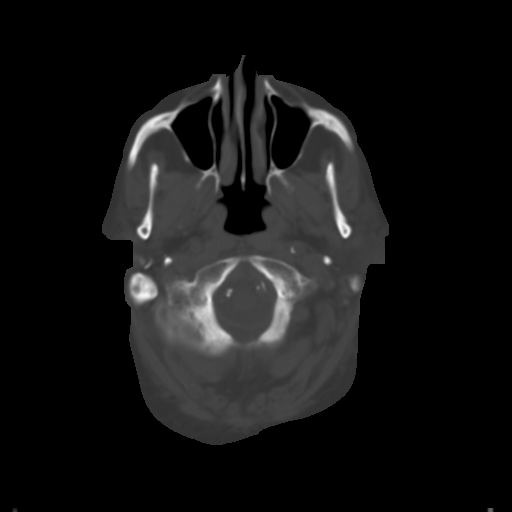
[im 7/33  brain]
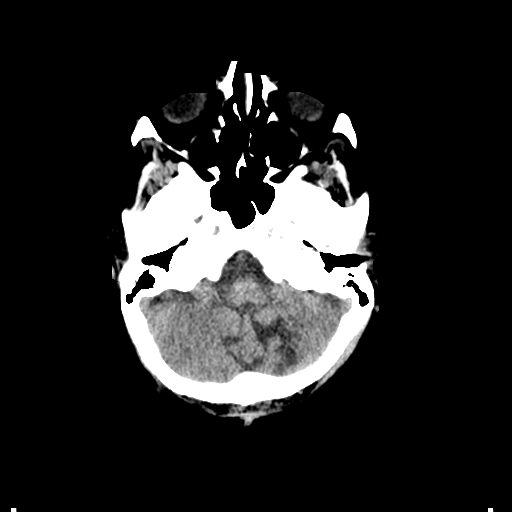
[im 10/33  brain]
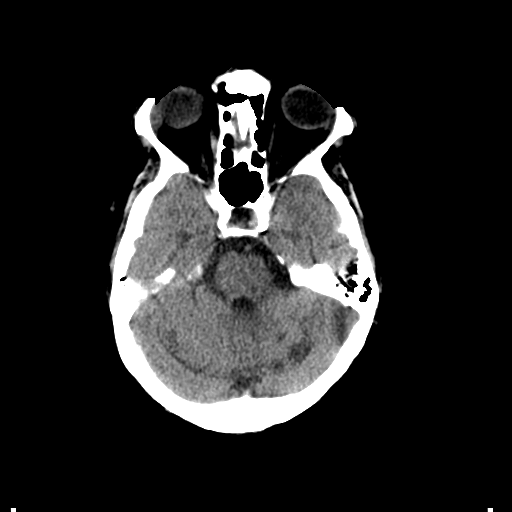
[im 15/33  brain]
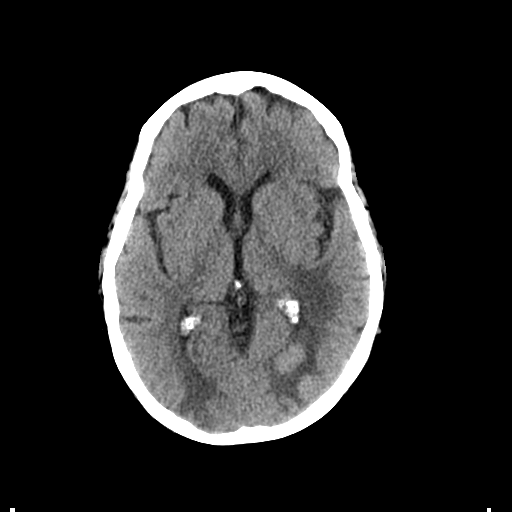
[im 18/33  brain]
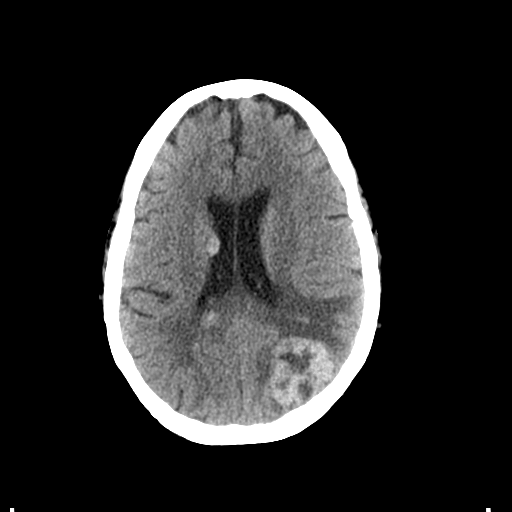
[im 18/33  bone]
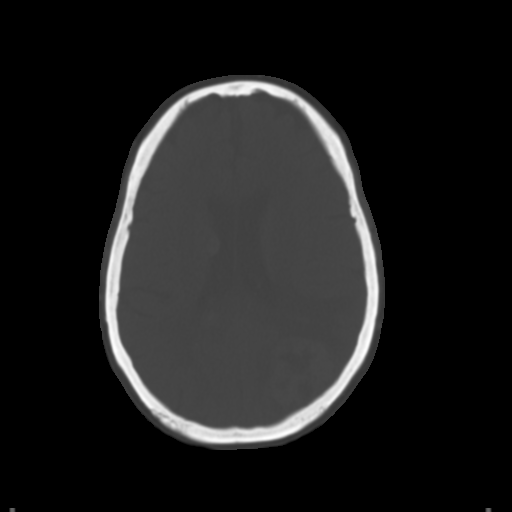
[im 23/33  brain]
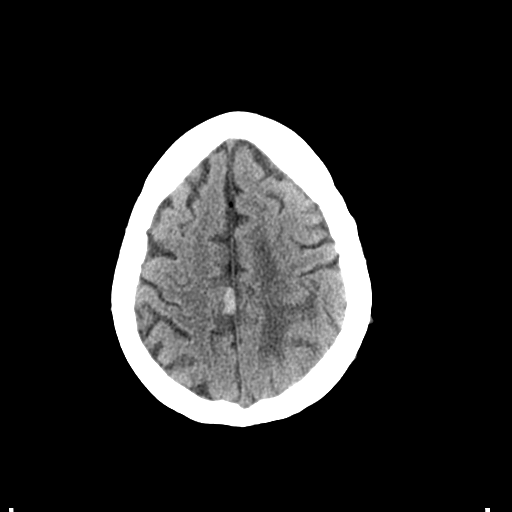
[im 26/33  brain]
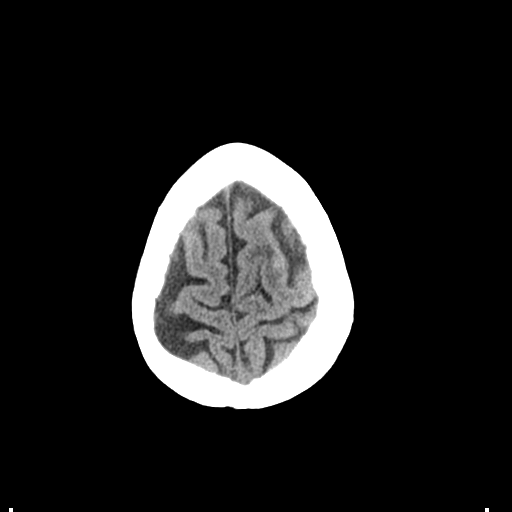
[im 30/33  brain]
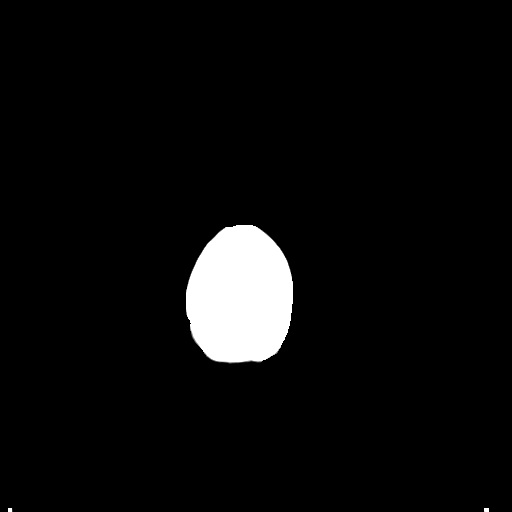

[Series 4: coronal soft tissue · coronal · 0.32mm/px · 3 of 61 slices shown]
[im 21/61  brain]
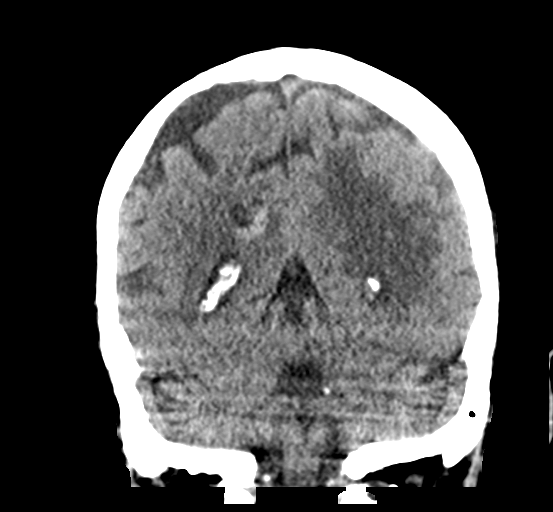
[im 27/61  brain]
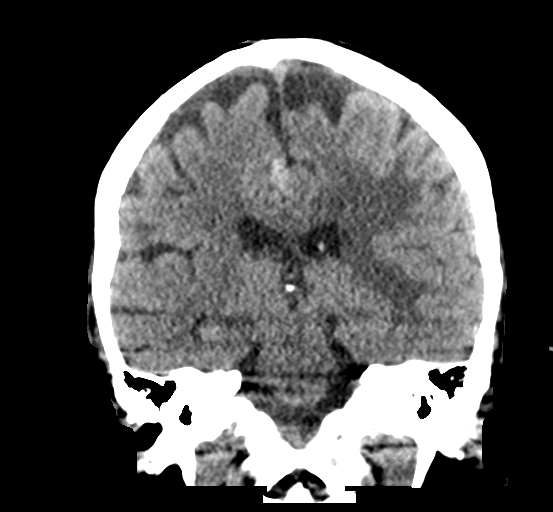
[im 34/61  brain]
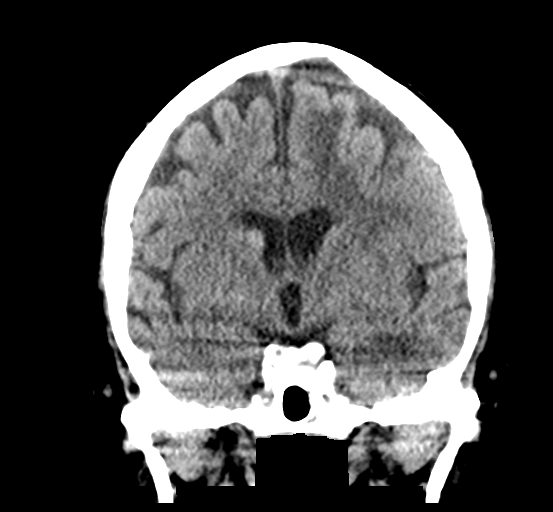

[Series 5: sagittal soft tissue · sagittal · 0.32mm/px · 3 of 48 slices shown]
[im 16/48  brain]
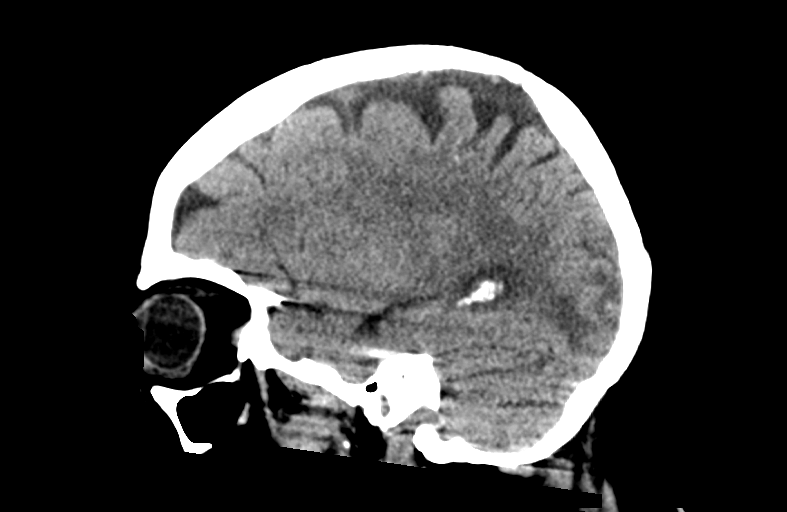
[im 24/48  brain]
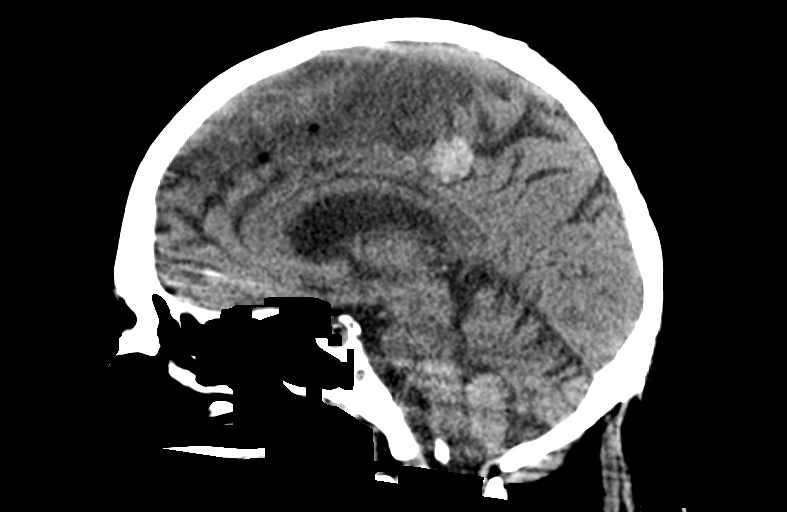
[im 32/48  brain]
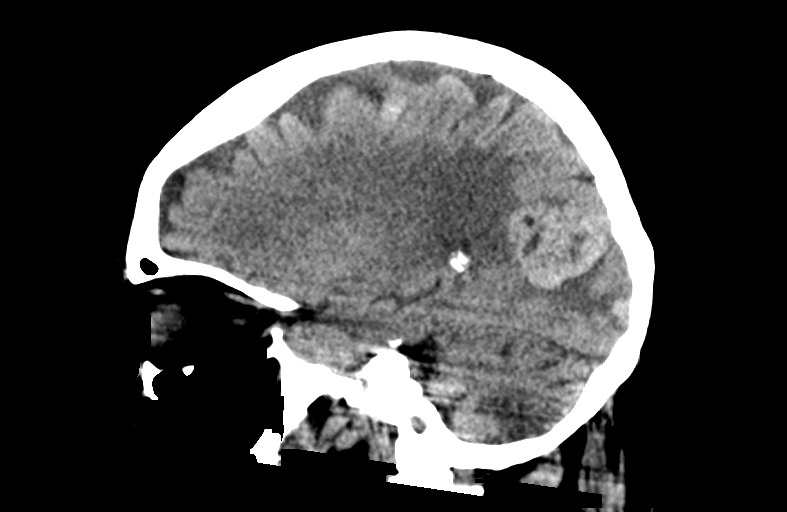

[14 of 47 positions shown; findings below may reference images not displayed]

FINDINGS: Brain: Multiple intracranial masses are again identified, consistent
with metastatic disease. Largest heterogeneously hyperdense mass
within the left parietal lobe measures 3.3 x 3.1 cm reference image
[DATE], with marked surrounding vasogenic edema, stable.

There are and least 5 other hyperdense intracranial masses again
identified. The sub ependymal lesion in the right lateral ventricle
now measures up to 1.1 x 0.6 cm, previously subcentimeter in size.
Left frontal hyperdense lesion measures 1.0 x 0.8 cm in size,
previously 0.8 x 0.6 cm. I do not see any new metastatic lesions
since the prior exam. Again, hyperdensity in these lesions suggest
hemorrhagic component.

No evidence of acute infarct. The lateral ventricles and remaining
midline structures are stable. Chronic encephalomalacia within the
cerebellum. No acute extra-axial fluid collections. No significant
mass effect or midline shift.

Vascular: No hyperdense vessel or unexpected calcification.

Skull: Normal. Negative for fracture or focal lesion.

Sinuses/Orbits: No acute finding.

Other: None.
IMPRESSION: 1. Multiple hyperdense intracranial lesions, consistent with
hemorrhagic metastases. The dominant lesion in the left parietal
lobe is grossly stable, with minimal increase in size of smaller
index lesions as described above.
2. No significant mass effect or midline shift.
3. No new lesions since previous exam.
4. No evidence of acute infarct.

## 2021-11-01 MED ORDER — LEVETIRACETAM 500 MG PO TABS
500.0000 mg | ORAL_TABLET | Freq: Two times a day (BID) | ORAL | Status: DC
  Administered 2021-11-01 – 2021-11-05 (×8): 500 mg via ORAL
  Filled 2021-11-01 (×8): qty 1

## 2021-11-01 MED ORDER — SODIUM CHLORIDE 0.9 % IV SOLN
1.0000 g | Freq: Once | INTRAVENOUS | Status: AC
  Administered 2021-11-01: 1 g via INTRAVENOUS
  Filled 2021-11-01: qty 10

## 2021-11-01 MED ORDER — INSULIN ASPART 100 UNIT/ML IJ SOLN
0.0000 [IU] | INTRAMUSCULAR | Status: DC
  Administered 2021-11-02: 3 [IU] via SUBCUTANEOUS
  Administered 2021-11-02 (×3): 2 [IU] via SUBCUTANEOUS
  Administered 2021-11-03: 5 [IU] via SUBCUTANEOUS
  Administered 2021-11-03: 7 [IU] via SUBCUTANEOUS
  Administered 2021-11-03: 5 [IU] via SUBCUTANEOUS
  Administered 2021-11-03 (×2): 2 [IU] via SUBCUTANEOUS
  Administered 2021-11-03: 3 [IU] via SUBCUTANEOUS
  Administered 2021-11-04: 7 [IU] via SUBCUTANEOUS
  Administered 2021-11-04: 5 [IU] via SUBCUTANEOUS
  Administered 2021-11-04: 9 [IU] via SUBCUTANEOUS
  Administered 2021-11-04: 7 [IU] via SUBCUTANEOUS
  Administered 2021-11-04: 5 [IU] via SUBCUTANEOUS
  Administered 2021-11-04: 10 [IU] via SUBCUTANEOUS
  Administered 2021-11-05 (×2): 5 [IU] via SUBCUTANEOUS
  Administered 2021-11-05: 3 [IU] via SUBCUTANEOUS
  Administered 2021-11-05: 5 [IU] via SUBCUTANEOUS
  Filled 2021-11-01: qty 0.09

## 2021-11-01 MED ORDER — LIP MEDEX EX OINT
TOPICAL_OINTMENT | Freq: Once | CUTANEOUS | Status: AC
Start: 2021-11-01 — End: 2021-11-01
  Filled 2021-11-01: qty 7

## 2021-11-01 NOTE — Assessment & Plan Note (Signed)
-   currently appears to be slightly on the dry side, hold home diuretics for tonight and restart when appears euvolemic, carefuly follow fluid status and Cr  

## 2021-11-01 NOTE — Progress Notes (Signed)
[facility] Work  Clinical Social Work was referred by Engineer, site for assessment of psychosocial needs.  Clinical Social Worker met with patient, palliative care, and patient's son Juliane Lack)  to offer support and assess for needs.  Per son pt had requested to discharge home from the hospital as she believed she could care for herself; however, since discharge patients states she has been unable to ambulate in any way and remained bed bound since arriving home.  Pt reportedly had soiled herself multiple times and remained in bed when EMS arrived to provide transport for treatment.  Per son he was contacted by EMS to clean up his mother prior to transport and was seeing her deconditioned state for the first time.  Pt's son requested he be contacted by medical team to determine disposition, to which pt verbalized agreement as pt's daughter has not been answering calls.  Pt's son and daughter are reportedly estranged, but both have a relationship with their mother.  Pt's son advised to contact DSS to complete a Medicaid application for long term care and given contact information for elder law to assist with long term planning.  Pt states she does not feel safe returning home and is unable to care for herself.  Pt's son states there is no one available to provide 24 hour supervision at present and is requesting assistance to discharge pt to a SNF.  Pt's son states he has also been in touch with a hospice agency to begin discussing long term planning.  Pt transporting from radiation to the ED.      Henriette Combs, [place] Worker [facility]

## 2021-11-01 NOTE — H&P (Signed)
[name]:[id number] DOB: 03/29/1966 DOA: 11/01/2021     PCP: Celene Squibb, MD   Outpatient Specialists:      GI  Dr.  Lorenso Courier   RadOnc  Dr. Isidore Moos Patient arrived to ER on 11/01/21 at 1444 Referred by Attending Davonna Belling, MD   Patient coming from:    home Lives  With family    Chief Complaint:   Chief Complaint  Patient presents with   Weakness    HPI: Natalie Contreras is a 56 y.o. female with medical history significant of  renal metastatic cancer getting brain radiation    Presented with   fatigue Weakness and decreased energy Was at [facility] yesterday and diagnosed with UTI. She is currently undergoing treatment for metastatic renal cancer to multiple areas She also reports a bump on her anterior chest   Family and pt interested in Hospice Family and pt would like placement No aggressive work up  No longer smokes  Initial COVID TEST  NEGATIVE   Lab Results  Component Value Date   [place] 10/24/2021   [place] NEGATIVE 10/13/2021   [place] NEGATIVE 09/03/2019   [place] NEGATIVE 08/08/2019     Regarding pertinent Chronic problems:     Hyperlipidemia -  on statins on Zocor Lipid Panel     Component Value Date/Time   CHOL 155 04/11/2017 0511   TRIG 79 04/11/2017 0511   HDL 35 (L) 04/11/2017 0511   CHOLHDL 4.4 04/11/2017 0511   VLDL 16 04/11/2017 0511   LDLCALC 104 (H) 04/11/2017 0511     HTN on coreg, hydralazine   chronic CHF diastolic  - last echo on Lasix       DM 2 -  Lab Results  Component Value Date   HGBA1C 5.4 10/13/2021   on insulin jardiance    obesity-   BMI Readings from Last 1 Encounters:  10/31/21 35.99 kg/m     CKD stage IIIa- baseline Cr 1.07 Estimated Creatinine Clearance: 61 mL/min (A) (by C-G formula based on SCr of 1.18 mg/dL (H)).  Lab Results  Component Value Date   CREATININE 1.18 (H) 11/01/2021   CREATININE 1.22 (H) 10/31/2021   CREATININE 1.07 (H) 10/29/2021     Chronic anemia - baseline hg Hemoglobin & Hematocrit  Recent Labs    10/28/21 0714 10/31/21 1620 11/01/21 1557  HGB 8.4* 9.0* 8.8*     While in ER:   Given a dose of Rocephin for UTi     Ordered  CT HEAD . Multiple hyperdense intracranial lesions, consistent with hemorrhagic metastases. The dominant lesion in the left parietal lobe is grossly stable, with minimal increase in size of smaller index lesions as described above.  CXR - 1. Pulmonary vascular congestion without overt pulmonary edema. 2. Stable bilateral pulmonary nodules.   Following Medications were ordered in ER: Medications  cefTRIAXone (ROCEPHIN) 1 g in sodium chloride 0.9 % 100 mL IVPB (0 g Intravenous Stopped 11/01/21 1800)    _______________________________________________________    ED Triage Vitals  Enc Vitals Group     BP 11/01/21 1502 (!) 148/58     Pulse Rate 11/01/21 1502 78     Resp 11/01/21 1502 18     Temp 11/01/21 1502 98 F (36.7 C)     Temp src --      SpO2 11/01/21 1502 93 %     Weight --      Height --      Head Circumference --  Peak Flow --      Pain Score 11/01/21 1511 0     Pain Loc --      Pain Edu? --      Excl. in [place]? --   TMAX(24)@     _________________________________________ Significant initial  Findings: Abnormal Labs Reviewed  COMPREHENSIVE METABOLIC PANEL - Abnormal; Notable for the following components:      Result Value   Glucose, Bld 170 (*)    BUN 69 (*)    Creatinine, Ser 1.18 (*)    Calcium 8.6 (*)    Total Protein 5.8 (*)    Albumin 2.7 (*)    Alkaline Phosphatase 139 (*)    GFR, Estimated 55 (*)    All other components within normal limits  CBC WITH DIFFERENTIAL/PLATELET - Abnormal; Notable for the following components:   RBC 3.66 (*)    Hemoglobin 8.8 (*)    HCT 30.4 (*)    MCH 24.0 (*)    MCHC 28.9 (*)    RDW 20.9 (*)    All other components within normal limits       ECG: Ordered Personally reviewed by me showing: HR :  68 Rhythm:  NSR,   no evidence of ischemic changes QTC 458   ____________________ This patient meets SIRS Criteria and may be septic.    The recent clinical data is shown below. Vitals:   11/01/21 1630 11/01/21 1700 11/01/21 1730 11/01/21 1800  BP: (!) 156/54 (!) 148/51 (!) 145/50 (!) 162/47  Pulse: 80 74 73 79  Resp: 16 19 17  (!) 21  Temp:      SpO2: 92% 93% 90% 96%    WBC     Component Value Date/Time   WBC 7.8 11/01/2021 1557   LYMPHSABS 1.3 11/01/2021 1557   MONOABS 0.8 11/01/2021 1557   EOSABS 0.0 11/01/2021 1557   BASOSABS 0.0 11/01/2021 1557      Procalcitonin     UA   no evidence of UTI      Urine analysis:    Component Value Date/Time   COLORURINE YELLOW 10/31/2021 1556   APPEARANCEUR CLOUDY (A) 10/31/2021 1556   LABSPEC 1.013 10/31/2021 1556   PHURINE 5.0 10/31/2021 1556   GLUCOSEU NEGATIVE 10/31/2021 1556   HGBUR MODERATE (A) 10/31/2021 1556   BILIRUBINUR NEGATIVE 10/31/2021 1556   KETONESUR NEGATIVE 10/31/2021 1556   PROTEINUR 100 (A) 10/31/2021 1556   NITRITE NEGATIVE 10/31/2021 1556   LEUKOCYTESUR LARGE (A) 10/31/2021 1556    Results for orders placed or performed during the hospital encounter of 10/23/21  Resp Panel by RT-PCR (Flu A&B, Covid) Nasopharyngeal Swab     Status: None   Collection Time: 10/24/21  7:31 AM   Specimen: Nasopharyngeal Swab; Nasopharyngeal(NP) swabs in vial transport medium  Result Value Ref Range Status   SARS Coronavirus 2 by RT PCR NEGATIVE NEGATIVE Final         Influenza A by PCR NEGATIVE NEGATIVE Final   Influenza B by PCR NEGATIVE NEGATIVE Final           _______________________________________________ Hospitalist was called for admission for UTI  The following Work up has been ordered so far:  Orders Placed This Encounter  Procedures   Urine Culture   DG Chest Portable 1 View   CT HEAD WO CONTRAST (5MM)   Comprehensive metabolic panel   CBC with Differential   Consult to hospitalist     OTHER  Significant initial  Findings:  labs showing:    Recent  Labs  Lab 10/26/21 0437 10/27/21 0439 10/28/21 0443 10/29/21 0422 10/31/21 1620 11/01/21 1557  NA 138 138 137 137 134* 139  K 4.5 4.7 5.5* 4.7 4.0 3.9  CO2 22 22 20* 22 18* 23  GLUCOSE 254* 259* 208* 206* 187* 170*  BUN 49* 63* 67* 72* 69* 69*  CREATININE 1.43* 1.46* 1.43* 1.07* 1.22* 1.18*  CALCIUM 8.5* 8.6* 8.5* 8.8* 8.7* 8.6*  MG 2.1  --   --   --   --   --     Cr   stable,    Lab Results  Component Value Date   CREATININE 1.18 (H) 11/01/2021   CREATININE 1.22 (H) 10/31/2021   CREATININE 1.07 (H) 10/29/2021    Recent Labs  Lab 10/31/21 1620 11/01/21 1557  AST 17 15  ALT 31 31  ALKPHOS 160* 139*  BILITOT 0.4 0.4  PROT 6.3* 5.8*  ALBUMIN 3.1* 2.7*   Lab Results  Component Value Date   CALCIUM 8.6 (L) 11/01/2021   PHOS 3.0 10/17/2021          Plt: Lab Results  Component Value Date   PLT 346 11/01/2021      COVID-19 Labs  No results for input(s): DDIMER, FERRITIN, LDH, CRP in the last 72 hours.  Lab Results  Component Value Date   SARSCOV2NAA NEGATIVE 10/24/2021   [place] NEGATIVE 10/13/2021   [place] NEGATIVE 09/03/2019   [place] NEGATIVE 08/08/2019       Recent Labs  Lab 10/26/21 0437 10/27/21 0439 10/28/21 0714 10/31/21 1620 11/01/21 1557  WBC 7.3 5.9 6.7 9.3 7.8  NEUTROABS  --   --   --   --  5.7  HGB 7.8* 8.0* 8.4* 9.0* 8.8*  HCT 26.3* 27.9* 28.8* 31.5* 30.4*  MCV 82.2 83.3 82.5 83.6 83.1  PLT 319 328 323 385 346    HG/HCT  stable,     Component Value Date/Time   HGB 8.8 (L) 11/01/2021 1557   HCT 30.4 (L) 11/01/2021 1557   MCV 83.1 11/01/2021 1557      No results for input(s): LIPASE, AMYLASE in the last 168 hours. No results for input(s): AMMONIA in the last 168 hours.    Cardiac Panel (last 3 results) No results for input(s): CKTOTAL, CKMB, TROPONINI, RELINDX in the last 72 hours.  .car BNP (last 3 results) Recent Labs    10/23/21 1320  BNP  353.4*      DM  labs:  HbA1C: Recent Labs    10/13/21 2254  HGBA1C 5.4       CBG (last 3)  Recent Labs    10/30/21 0748 10/30/21 1254 10/30/21 1650  GLUCAP 316* 351* 161*          Cultures:    Component Value Date/Time   SDES URINE, CLEAN CATCH 09/12/2019 0654   SPECREQUEST  09/12/2019 0654    NONE Performed at [facility], [place] [address], [place], [place] [address]    CULT MULTIPLE SPECIES PRESENT, SUGGEST RECOLLECTION (A) 09/12/2019 0654   REPTSTATUS 09/13/2019 FINAL 09/12/2019 0654     Radiological Exams on Admission: CT HEAD WO CONTRAST (5MM)  Result Date: 11/01/2021 CLINICAL DATA:  Altered level of consciousness, increasing confusion weakness, history of metastatic cancer EXAM: CT HEAD WITHOUT CONTRAST TECHNIQUE: Contiguous axial images were obtained from the base of the skull through the vertex without intravenous contrast. RADIATION DOSE REDUCTION: This exam was performed according to the departmental dose-optimization program which includes automated exposure control, adjustment of the  mA and/or kV according to patient size and/or use of iterative reconstruction technique. COMPARISON:  10/24/2021, 10/19/2021 FINDINGS: Brain: Multiple intracranial masses are again identified, consistent with metastatic disease. Largest heterogeneously hyperdense mass within the left parietal lobe measures 3.3 x 3.1 cm reference image 18/2, with marked surrounding vasogenic edema, stable. There are and least 5 other hyperdense intracranial masses again identified. The sub ependymal lesion in the right lateral ventricle now measures up to 1.1 x 0.6 cm, previously subcentimeter in size. Left frontal hyperdense lesion measures 1.0 x 0.8 cm in size, previously 0.8 x 0.6 cm. I do not see any new metastatic lesions since the prior exam. Again, hyperdensity in these lesions suggest hemorrhagic component. No evidence of acute infarct. The lateral ventricles and remaining midline structures  are stable. Chronic encephalomalacia within the cerebellum. No acute extra-axial fluid collections. No significant mass effect or midline shift. Vascular: No hyperdense vessel or unexpected calcification. Skull: Normal. Negative for fracture or focal lesion. Sinuses/Orbits: No acute finding. Other: None. IMPRESSION: 1. Multiple hyperdense intracranial lesions, consistent with hemorrhagic metastases. The dominant lesion in the left parietal lobe is grossly stable, with minimal increase in size of smaller index lesions as described above. 2. No significant mass effect or midline shift. 3. No new lesions since previous exam. 4. No evidence of acute infarct. Electronically Signed   By: Randa Ngo M.D.   On: 11/01/2021 16:32   DG Chest Portable 1 View  Result Date: 11/01/2021 CLINICAL DATA:  Increased confusion and weakness. EXAM: PORTABLE CHEST 1 VIEW COMPARISON:  10/05/2021 FINDINGS: Bilateral pulmonary nodules again noted, similar to prior chest x-ray better characterized on CT chest 10/05/2021. The cardio pericardial silhouette is enlarged. There is pulmonary vascular congestion without overt pulmonary edema. No evidence for pneumothorax or pleural effusion. Telemetry leads overlie the chest. IMPRESSION: 1. Pulmonary vascular congestion without overt pulmonary edema. 2. Stable bilateral pulmonary nodules. Electronically Signed   By: Misty Stanley M.D.   On: 11/01/2021 16:39   _______________________________________________________________________________________________________ Latest  Blood pressure (!) 162/47, pulse 79, temperature 98 F (36.7 C), resp. rate (!) 21, last menstrual period 12/16/2015, SpO2 96 %.   Vitals  labs and radiology finding personally reviewed  Review of Systems:     Pertinent positives include:  fatigue,   Constitutional:  No weight loss, night sweats, Fevers, chills, weight loss  HEENT:  No headaches, Difficulty swallowing,Tooth/dental problems,Sore throat,  No  sneezing, itching, ear ache, nasal congestion, post nasal drip,  Cardio-vascular:  No chest pain, Orthopnea, PND, anasarca, dizziness, palpitations.no Bilateral lower extremity swelling  GI:  No heartburn, indigestion, abdominal pain, nausea, vomiting, diarrhea, change in bowel habits, loss of appetite, melena, blood in stool, hematemesis Resp:  no shortness of breath at rest. No dyspnea on exertion, No excess mucus, no productive cough, No non-productive cough, No coughing up of blood.No change in color of mucus.No wheezing. Skin:  no rash or lesions. No jaundice GU:  no dysuria, change in color of urine, no urgency or frequency. No straining to urinate.  No flank pain.  Musculoskeletal:  No joint pain or no joint swelling. No decreased range of motion. No back pain.  Psych:  No change in mood or affect. No depression or anxiety. No memory loss.  Neuro: no localizing neurological complaints, no tingling, no weakness, no double vision, no gait abnormality, no slurred speech, no confusion  All systems reviewed and apart from [place] all are negative _______________________________________________________________________________________________ Past Medical History:   Past Medical History:  Diagnosis Date  Abscess of foot 08/09/2019   WITH ULCER  RIGHT FOOT   Acute diastolic CHF (congestive heart failure) ([place]) 10/28/2016   Brain cancer ([place])    Cancer of kidney ([place])    Carotid stenosis, asymptomatic, bilateral 01/15/2017   Diabetes mellitus without complication (HCC)    Heart murmur    Hypertension    PAD (peripheral artery disease) ([place]) 04/10/2017   Left ABI 0.74. Right ABI 0.96.   Stomach cancer [facility])     Past Surgical History:  Procedure Laterality Date   ABDOMINAL AORTOGRAM W/LOWER EXTREMITY N/A 08/10/2019   Procedure: ABDOMINAL AORTOGRAM W/LOWER EXTREMITY;  Surgeon: Waynetta Sandy, MD;  Location: [place] [facility];  Service: Cardiovascular;  Laterality: N/A;    AMPUTATION Right 09/03/2019   Procedure: RIGHT BELOW KNEE AMPUTATION;  Surgeon: Newt Minion, MD;  Location: [place];  Service: Orthopedics;  Laterality: Right;   AMPUTATION TOE Left 05/05/2017   Procedure: LEFT SECOND TOE AMPUTATION;  Surgeon: Aviva Signs, MD;  Location: [facility];  Service: General;  Laterality: Left;   BIOPSY  10/16/2021   Procedure: BIOPSY;  Surgeon: Sharyn Creamer, MD;  Location: [facility];  Service: Gastroenterology;;   CATARACT EXTRACTION W/PHACO Left 02/08/2016   Procedure: CATARACT EXTRACTION PHACO AND INTRAOCULAR LENS PLACEMENT ([place]);  Surgeon: Tonny Branch, MD;  Location: [facility];  Service: Ophthalmology;  Laterality: Left;  CDE 11.43    CATARACT EXTRACTION W/PHACO Right 02/26/2016   Procedure: CATARACT EXTRACTION PHACO AND INTRAOCULAR LENS PLACEMENT RIGHT; CDE:  16.90;  Surgeon: Tonny Branch, MD;  Location: [facility];  Service: Ophthalmology;  Laterality: Right;   CHOLECYSTECTOMY     ESOPHAGOGASTRODUODENOSCOPY (EGD) WITH PROPOFOL N/A 10/16/2021   Procedure: ESOPHAGOGASTRODUODENOSCOPY (EGD) WITH PROPOFOL;  Surgeon: Sharyn Creamer, MD;  Location: [place];  Service: Gastroenterology;  Laterality: N/A;   FOREIGN BODY REMOVAL Left 04/09/2017   Procedure: FOREIGN BODY REMOVAL ADULT FOOT;  Surgeon: Aviva Signs, MD;  Location: [facility];  Service: General;  Laterality: Left;   I & D EXTREMITY Right 08/11/2019   Procedure: IRRIGATION AND DEBRIDEMENT EXTREMITY;  Surgeon: Newt Minion, MD;  Location: [place];  Service: Orthopedics;  Laterality: Right;   PERIPHERAL VASCULAR ATHERECTOMY Right 08/10/2019   Procedure: PERIPHERAL VASCULAR ATHERECTOMY;  Surgeon: Waynetta Sandy, MD;  Location: [place] [facility];  Service: Cardiovascular;  Laterality: Right;  superficial femoral   PERIPHERAL VASCULAR BALLOON ANGIOPLASTY Right 08/10/2019   Procedure: PERIPHERAL VASCULAR BALLOON ANGIOPLASTY;  Surgeon: Waynetta Sandy, MD;  Location: [place] [facility];  Service:  Cardiovascular;  Laterality: Right;  anterior tibial   TRANSMETATARSAL AMPUTATION Left 07/21/2017   Procedure: TRANSMETATARSAL AMPUTATION LEFT FOOT;  Surgeon: Aviva Signs, MD;  Location: [facility];  Service: General;  Laterality: Left;    Social History:     reports that she has been smoking cigarettes. She started smoking about 37 years ago. She has a 18.75 pack-year smoking history. She has never used smokeless tobacco. She reports that she does not drink alcohol and does not use drugs.     Family History:   Family History  Problem Relation Age of Onset   Lung cancer Mother    Lung cancer Father    AAA (abdominal aortic aneurysm) Brother    ______________________________________________________________________________________________ Allergies: No Known Allergies   Prior to Admission medications   Medication Sig Start Date End Date Taking? Authorizing Provider  acetaminophen (TYLENOL) 325 MG tablet Take 1-2 tablets (325-650 mg total) by mouth every 4 (four) hours as needed  for mild pain. 09/13/19   Love, Ivan Anchors, PA-C  carvedilol (COREG) 6.25 MG tablet Take 1 tablet (6.25 mg total) by mouth 2 (two) times daily with a meal. 10/29/21   Pokhrel, Corrie Mckusick, MD  cephALEXin (KEFLEX) 500 MG capsule Take 1 capsule (500 mg total) by mouth 3 (three) times daily. 10/31/21   Lajean Saver, MD  Continuous Blood Gluc Sensor (DEXCOM G6 SENSOR) MISC Inject 1 Device into the skin See admin instructions. Place 1 device/sensor into the skin every 10 days, after removing the former one 10/03/21   [provider]  dexamethasone (DECADRON) 4 MG tablet Take 1 tablet (4 mg total) by mouth 3 (three) times daily for 10 days. 10/29/21 11/08/21  Pokhrel, Corrie Mckusick, MD  docusate sodium (COLACE) 100 MG capsule Take 1 capsule (100 mg total) by mouth 2 (two) times daily. Patient taking differently: Take 100 mg by mouth 2 (two) times daily as needed for mild constipation. 09/16/19   Love, Ivan Anchors, PA-C   ergocalciferol (VITAMIN D2) 1.25 MG (50000 UT) capsule Take 50,000 Units by mouth every Sunday.    [provider]  furosemide (LASIX) 40 MG tablet Take 1 tablet (40 mg total) by mouth daily. 10/29/21 11/28/21  Pokhrel, Corrie Mckusick, MD  hydrALAZINE (APRESOLINE) 25 MG tablet Take 1 tablet (25 mg total) by mouth every 8 (eight) hours. 10/29/21   Pokhrel, Corrie Mckusick, MD  hydrocortisone (ANUSOL-HC) 25 MG suppository Place 1 suppository (25 mg total) rectally 2 (two) times daily. 10/29/21   Pokhrel, Corrie Mckusick, MD  insulin aspart (NOVOLOG) 100 UNIT/ML injection Inject 100 Units into the skin See admin instructions. Up to 100 units per day in omni pod. Omnipod to be filled with Novolog to be accurately injected by Omnipod based on settings.    [provider]  Insulin Disposable Pump (OMNIPOD DASH PODS, GEN 4,) MISC Inject into the skin continuous.    [provider]  JARDIANCE 25 MG TABS tablet Take 25 mg by mouth daily. 10/03/21   [provider]  LORazepam (ATIVAN) 1 MG tablet Take one tablet 40 minutes prior to radiation treatment.  If you do not feel calmer after 20 minutes, you can take a 2nd tablet prior to radiation treatment. Need driver while on this med. 10/31/21   Eppie Gibson, MD  Multiple Vitamin (MULTIVITAMIN WITH MINERALS) TABS tablet Take 1 tablet by mouth daily. 09/14/19   Love, Ivan Anchors, PA-C  ondansetron (ZOFRAN) 4 MG tablet Take 1 tablet (4 mg total) by mouth every 6 (six) hours as needed for nausea. 10/29/21   Pokhrel, Corrie Mckusick, MD  oxyCODONE-acetaminophen (PERCOCET/ROXICET) 5-325 MG tablet Take 1 tablet by mouth every 6 (six) hours as needed for moderate pain or severe pain. 10/29/21   Pokhrel, Corrie Mckusick, MD  pantoprazole (PROTONIX) 40 MG tablet Take 1 tablet (40 mg total) by mouth 2 (two) times daily. 10/18/21 11/17/21  Nolberto Hanlon, MD  polyethylene glycol (MIRALAX / GLYCOLAX) 17 g packet Take 17 g by mouth 2 (two) times daily. Patient taking differently: Take 17 g by mouth 2  (two) times daily as needed. 09/16/19   Love, Ivan Anchors, PA-C  simvastatin (ZOCOR) 40 MG tablet Take 40 mg by mouth at bedtime.     [provider]  sucralfate (CARAFATE) 1 GM/10ML suspension Take 10 mLs (1 g total) by mouth 4 (four) times daily -  with meals and at bedtime. 10/18/21 11/17/21  Nolberto Hanlon, MD    ___________________________________________________________________________________________________ Physical Exam: Vitals with BMI 11/01/2021 11/01/2021 11/01/2021  Height - - -  Weight - - -  BMI - - -  Systolic 431 540 086  Diastolic 47 50 51  Pulse 79 73 74     1. General:  in No  Acute distress   Chronically ill   -appearing 2. Psychological: Alert and   Oriented 3. Head/ENT:   Moist   Dry Mucous Membranes                          Head Non traumatic, neck supple                          Normal   Poor Dentition 4. SKIN:  decreased Skin turgor,  Skin clean Dry and intact no rash Nodule on anterior chest 5. Heart: Regular rate and rhythm no  Murmur, no Rub or gallop 6. Lungs:  Clear to auscultation bilaterally, no wheezes or crackles   7. Abdomen: Soft,  non-tender, Non distended   obese  bowel sounds present 8. Lower extremities: no clubbing, cyanosis, no  edema 9. Neurologically Grossly intact, moving all 4 extremities equally   10. MSK: Normal range of motion    Chart has been reviewed  ______________________________________________________________________________________________  Assessment/Plan 56 y.o. female with medical history significant of  renal metastatic cancer getting brain radiation   Admitted for UTI family would like avoiad aggressive interventions  Present on Admission:  UTI (urinary tract infection)  Benign essential HTN  Hyperlipidemia  Obesity (BMI 30-39.9)  Ambulatory dysfunction  Metastatic cancer to brain ([place])  Severe protein-calorie malnutrition ([place])  Chronic diastolic CHF (congestive heart failure) (HCC)  Anemia  Renal cell  carcinoma (HCC)     UTI (urinary tract infection)  - treat with Rocephin       await results of urine culture and adjust antibiotic coverage as needed   Benign essential HTN Allow permissive HTN for tonight  Hyperlipidemia Stable cont home meds  S/P BKA (below knee amputation) (HCC) Chronic stable  Chronic diastolic CHF (congestive heart failure) ([place]) - currently appears to be slightly on the dry side, hold home diuretics for tonight and restart when appears euvolemic, carefuly follow fluid status and Cr   Severe protein-calorie malnutrition (HCC) nutritional consult Check prealbumin  Anemia obtain anemia panel and transfuse as needed  Metastatic cancer to brain [facility]) Patient been getting radiation therapy although she has been having some difficulty getting to an appointment.  Continue Decadron and Keppra to prevent seizure activity.  Per patient family at this point they would like to concentrate on comfort care At the time of discharge would need to further discuss her options with radonc  Renal cell carcinoma [facility]) Per family and patient currently comfort care avoid aggressive interventions Metastases to multiple sites.   Other plan as per orders.  DVT prophylaxis:  SCD        Code Status:  DNR/DNI  comfort care as per patient  family  I had personally discussed CODE STATUS with patient and family    Family Communication:   Family  at  Bedside  plan of care was discussed   with   Son,    Disposition Plan:     likely will need placement for rehabilitation   Following barriers for discharge:                            Electrolytes corrected  Anemia stable                             Pain controlled with PO medications                                                          Will need to be able to tolerate PO                                                 Would benefit from PT/OT eval prior to DC  Ordered                                        Diabetes care coordinator                   Transition of care consulted                   Nutrition    consulted                                    Palliative care    consulted                               Consults called:  none    Admission status:  ED Disposition     ED Disposition  [place]: [facility] [[id number]]  Level of Care: Med-Surg [16]  [facility] [place] if equivalent level of care is available:: No  Covid Evaluation: Confirmed COVID Negative  Diagnosis: UTI (urinary tract infection) [[id number]]  Admitting Physician: Toy Baker [[id number]]  Attending Physician: Toy Baker [[id number]]           Obs      Level of care     tele  For 12H    Lab Results  Component Value Date   [place] 10/24/2021     Precautions: admitted as   Covid Negative     Ahlaya Ende 11/01/2021, 8:56 PM    [facility]     after 2 AM please page floor coverage PA If 7AM-7PM, please contact the day team taking care of the patient using [email or web address]   Patient was evaluated in the context of the global COVID-19 pandemic, which necessitated consideration that the patient might be at risk for infection with the SARS-CoV-2 virus that causes COVID-19. Institutional protocols and algorithms that pertain to the evaluation of patients at risk for COVID-19 are in a state of rapid change based on information released by regulatory bodies including the CDC and federal and state organizations. These policies and algorithms were followed during the patient's care.

## 2021-11-01 NOTE — Assessment & Plan Note (Signed)
nutritional consult Check prealbumin

## 2021-11-01 NOTE — Assessment & Plan Note (Signed)
Patient been getting radiation therapy although she has been having some difficulty getting to an appointment.  Continue Decadron and Keppra to prevent seizure activity.  Per patient family at this point they would like to concentrate on comfort care At the time of discharge would need to further discuss her options with radonc

## 2021-11-01 NOTE — ED Triage Notes (Signed)
Per transport, patient from radiology where she was transported for cancer treatment, NP requesting eval for increased confusion and weakness. 1mg  Ativan at noon. D/c from [facility] last night.

## 2021-11-01 NOTE — Subjective & Objective (Signed)
Weakness and decreased energy Was at [facility] yesterday and diagnosed with UTI. 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

## 2021-11-01 NOTE — Assessment & Plan Note (Signed)
Chronic-stable.

## 2021-11-01 NOTE — Assessment & Plan Note (Signed)
-   treat with Rocephin         await results of urine culture and adjust antibiotic coverage as needed  

## 2021-11-01 NOTE — Telephone Encounter (Signed)
xxxxx 

## 2021-11-01 NOTE — Assessment & Plan Note (Signed)
Per family and patient currently comfort care avoid aggressive interventions Metastases to multiple sites.

## 2021-11-01 NOTE — Assessment & Plan Note (Signed)
Allow permissive HTN for tonight 

## 2021-11-01 NOTE — Patient Instructions (Addendum)
Contact DSS for Long-Term Medicaid-call to schedule an appointment and to find out what documents you will need Hospice- not appropriate for in-patient but can be used for support  Palliative Care at [facility]: Almedia Balls) N[place], NP [place], RN [phone]

## 2021-11-01 NOTE — ED Provider Notes (Signed)
[place] DEPT Provider Note   CSN: [id number] Arrival date & time: 11/01/21  1444     History  Chief Complaint  Patient presents with   Weakness    MOXIE KALIL is a 56 y.o. female.   Weakness Associated symptoms: no shortness of breath   Patient presents with generalized weakness and decreased energy.  Has had admissions for same recently.  Had been seen at [facility] yesterday and diagnosed with UTI.  Lives at home with some home health.  Reportedly not doing well there now.  Has metastatic renal cancer to multiple areas.  Went to radiation treatment today and sent here.  Reportedly more confusion.  Likely will need more placement.  It appears that she had not been able to get as much help as needed due to insurance issues and also skilled nursing had been recommended but potentially had been refused.  Also complaining of new bump on anterior chest.      Home Medications Prior to Admission medications   Medication Sig Start Date End Date Taking? Authorizing Provider  acetaminophen (TYLENOL) 325 MG tablet Take 1-2 tablets (325-650 mg total) by mouth every 4 (four) hours as needed for mild pain. 09/13/19   Love, Ivan Anchors, PA-C  carvedilol (COREG) 6.25 MG tablet Take 1 tablet (6.25 mg total) by mouth 2 (two) times daily with a meal. 10/29/21   Pokhrel, Corrie Mckusick, MD  cephALEXin (KEFLEX) 500 MG capsule Take 1 capsule (500 mg total) by mouth 3 (three) times daily. 10/31/21   Lajean Saver, MD  Continuous Blood Gluc Sensor (DEXCOM G6 SENSOR) MISC Inject 1 Device into the skin See admin instructions. Place 1 device/sensor into the skin every 10 days, after removing the former one 10/03/21   [provider]  dexamethasone (DECADRON) 4 MG tablet Take 1 tablet (4 mg total) by mouth 3 (three) times daily for 10 days. 10/29/21 11/08/21  Pokhrel, Corrie Mckusick, MD  docusate sodium (COLACE) 100 MG capsule Take 1 capsule (100 mg total) by mouth 2 (two) times  daily. Patient taking differently: Take 100 mg by mouth 2 (two) times daily as needed for mild constipation. 09/16/19   Love, Ivan Anchors, PA-C  ergocalciferol (VITAMIN D2) 1.25 MG (50000 UT) capsule Take 50,000 Units by mouth every Sunday.    [provider]  furosemide (LASIX) 40 MG tablet Take 1 tablet (40 mg total) by mouth daily. 10/29/21 11/28/21  Pokhrel, Corrie Mckusick, MD  hydrALAZINE (APRESOLINE) 25 MG tablet Take 1 tablet (25 mg total) by mouth every 8 (eight) hours. 10/29/21   Pokhrel, Corrie Mckusick, MD  hydrocortisone (ANUSOL-HC) 25 MG suppository Place 1 suppository (25 mg total) rectally 2 (two) times daily. 10/29/21   Pokhrel, Corrie Mckusick, MD  insulin aspart (NOVOLOG) 100 UNIT/ML injection Inject 100 Units into the skin See admin instructions. Up to 100 units per day in omni pod. Omnipod to be filled with Novolog to be accurately injected by Omnipod based on settings.    [provider]  Insulin Disposable Pump (OMNIPOD DASH PODS, GEN 4,) MISC Inject into the skin continuous.    [provider]  JARDIANCE 25 MG TABS tablet Take 25 mg by mouth daily. 10/03/21   [provider]  LORazepam (ATIVAN) 1 MG tablet Take one tablet 40 minutes prior to radiation treatment.  If you do not feel calmer after 20 minutes, you can take a 2nd tablet prior to radiation treatment. Need driver while on this med. 10/31/21   Eppie Gibson, MD  Multiple Vitamin (MULTIVITAMIN WITH MINERALS) TABS tablet Take 1 tablet by mouth daily. 09/14/19   Love, Ivan Anchors, PA-C  ondansetron (ZOFRAN) 4 MG tablet Take 1 tablet (4 mg total) by mouth every 6 (six) hours as needed for nausea. 10/29/21   Pokhrel, Corrie Mckusick, MD  oxyCODONE-acetaminophen (PERCOCET/ROXICET) 5-325 MG tablet Take 1 tablet by mouth every 6 (six) hours as needed for moderate pain or severe pain. 10/29/21   Pokhrel, Corrie Mckusick, MD  pantoprazole (PROTONIX) 40 MG tablet Take 1 tablet (40 mg total) by mouth 2 (two) times daily. 10/18/21 11/17/21  Nolberto Hanlon, MD   polyethylene glycol (MIRALAX / GLYCOLAX) 17 g packet Take 17 g by mouth 2 (two) times daily. Patient taking differently: Take 17 g by mouth 2 (two) times daily as needed. 09/16/19   Love, Ivan Anchors, PA-C  simvastatin (ZOCOR) 40 MG tablet Take 40 mg by mouth at bedtime.     [provider]  sucralfate (CARAFATE) 1 GM/10ML suspension Take 10 mLs (1 g total) by mouth 4 (four) times daily -  with meals and at bedtime. 10/18/21 11/17/21  Nolberto Hanlon, MD      Allergies    Patient has no known allergies.    Review of Systems   Review of Systems  Constitutional:  Positive for fatigue. Negative for appetite change.  Respiratory:  Negative for shortness of breath.   Neurological:  Positive for weakness.   Physical Exam Updated Vital Signs BP (!) 162/47    Pulse 79    Temp 98 F (36.7 C)    Resp (!) 21    LMP 12/16/2015    SpO2 96%  Physical Exam Vitals and nursing note reviewed.  Cardiovascular:     Rate and Rhythm: Regular rhythm.  Pulmonary:     Breath sounds: No wheezing.     Comments: There is a mass under the skin on the left anterior chest in the parasternal area.  Mobile.  No erythema.  No fluctuance. Chest:     Chest wall: Tenderness present.  Abdominal:     Tenderness: There is no abdominal tenderness.  Skin:    Coloration: Skin is pale.  Neurological:     Mental Status: She is alert and oriented to person, place, and time.    ED Results / Procedures / Treatments   Labs (all labs ordered are listed, but only abnormal results are displayed) Labs Reviewed  COMPREHENSIVE METABOLIC PANEL - Abnormal; Notable for the following components:      Result Value   Glucose, Bld 170 (*)    BUN 69 (*)    Creatinine, Ser 1.18 (*)    Calcium 8.6 (*)    Total Protein 5.8 (*)    Albumin 2.7 (*)    Alkaline Phosphatase 139 (*)    GFR, Estimated 55 (*)    All other components within normal limits  CBC WITH DIFFERENTIAL/PLATELET - Abnormal; Notable for the following components:    RBC 3.66 (*)    Hemoglobin 8.8 (*)    HCT 30.4 (*)    MCH 24.0 (*)    MCHC 28.9 (*)    RDW 20.9 (*)    All other components within normal limits  URINE CULTURE    EKG None  Radiology CT HEAD WO CONTRAST (5MM)  Result Date: 11/01/2021 CLINICAL DATA:  Altered level of consciousness, increasing confusion weakness, history of metastatic cancer EXAM: CT HEAD WITHOUT CONTRAST TECHNIQUE: Contiguous axial images were obtained from the base of the skull through the vertex  without intravenous contrast. RADIATION DOSE REDUCTION: This exam was performed according to the departmental dose-optimization program which includes automated exposure control, adjustment of the mA and/or kV according to patient size and/or use of iterative reconstruction technique. COMPARISON:  10/24/2021, 10/19/2021 FINDINGS: Brain: Multiple intracranial masses are again identified, consistent with metastatic disease. Largest heterogeneously hyperdense mass within the left parietal lobe measures 3.3 x 3.1 cm reference image 18/2, with marked surrounding vasogenic edema, stable. There are and least 5 other hyperdense intracranial masses again identified. The sub ependymal lesion in the right lateral ventricle now measures up to 1.1 x 0.6 cm, previously subcentimeter in size. Left frontal hyperdense lesion measures 1.0 x 0.8 cm in size, previously 0.8 x 0.6 cm. I do not see any new metastatic lesions since the prior exam. Again, hyperdensity in these lesions suggest hemorrhagic component. No evidence of acute infarct. The lateral ventricles and remaining midline structures are stable. Chronic encephalomalacia within the cerebellum. No acute extra-axial fluid collections. No significant mass effect or midline shift. Vascular: No hyperdense vessel or unexpected calcification. Skull: Normal. Negative for fracture or focal lesion. Sinuses/Orbits: No acute finding. Other: None. IMPRESSION: 1. Multiple hyperdense intracranial lesions, consistent  with hemorrhagic metastases. The dominant lesion in the left parietal lobe is grossly stable, with minimal increase in size of smaller index lesions as described above. 2. No significant mass effect or midline shift. 3. No new lesions since previous exam. 4. No evidence of acute infarct. Electronically Signed   By: Randa Ngo M.D.   On: 11/01/2021 16:32   DG Chest Portable 1 View  Result Date: 11/01/2021 CLINICAL DATA:  Increased confusion and weakness. EXAM: PORTABLE CHEST 1 VIEW COMPARISON:  10/05/2021 FINDINGS: Bilateral pulmonary nodules again noted, similar to prior chest x-ray better characterized on CT chest 10/05/2021. The cardio pericardial silhouette is enlarged. There is pulmonary vascular congestion without overt pulmonary edema. No evidence for pneumothorax or pleural effusion. Telemetry leads overlie the chest. IMPRESSION: 1. Pulmonary vascular congestion without overt pulmonary edema. 2. Stable bilateral pulmonary nodules. Electronically Signed   By: Misty Stanley M.D.   On: 11/01/2021 16:39    Procedures Procedures    Medications Ordered in ED Medications  cefTRIAXone (ROCEPHIN) 1 g in sodium chloride 0.9 % 100 mL IVPB (0 g Intravenous Stopped 11/01/21 1800)    ED Course/ Medical Decision Making/ A&P                           Medical Decision Making Amount and/or Complexity of Data Reviewed Independent Historian:     Details: son also provides history External Data Reviewed: notes.    Details: Recent discharge summary and ER visit from yesterday Labs: ordered. Radiology: ordered and independent interpretation performed. Decision-making details documented in ED Course. Discussion of management or test interpretation with external provider(s): Hospitalist.  Risk Prescription drug management. Decision regarding hospitalization.   Patient presents with generalized weakness.  Initial differential diagnoses long and includes infection, dehydration, potentially  malignancy.  Patient has been doing worse recently.  Seen at [facility] for similar symptoms yesterday.  Diagnosed with UTI.  Started on Keflex.  Has been dwindling for a while now though.  Has metastatic cancer including to her brain.  Recent admission for same and from notes it appears that patient did not want skilled nursing placement and also some other issues with home health.  Back at home and has been more weak.  Not able to transfer.  Urinalysis  yesterday did show UTI.  Lab work reviewed today.  There is no anemia which appears stable.  Also some chronic kidney disease that is stable from before.  However decreased oral intake decreased energy.  Urine culture was sent.  At this point I think this can be failure of outpatient treatment of her UTI.  IV Rocephin given here.  Also with her known intracranial malignancy and worsening mental status CT scan done and independently interpreted by me and showed stable disease.  Will require admission to the hospital and will discuss with hospitalist          Final Clinical Impression(s) / ED Diagnoses Final diagnoses:  Acute cystitis without hematuria  Weakness  Metastatic malignant neoplasm, unspecified site [facility])    Rx / DC Orders ED Discharge Orders     None         Davonna Belling, MD 11/01/21 [phone]

## 2021-11-01 NOTE — Assessment & Plan Note (Signed)
obtain anemia panel and transfuse as needed

## 2021-11-01 NOTE — ED Notes (Signed)
Pure wick placed.

## 2021-11-01 NOTE — Assessment & Plan Note (Signed)
Stable cont home meds ?

## 2021-11-01 NOTE — Progress Notes (Signed)
[facility]  [phone] [phone]   Name: Natalie Contreras Date: 11/01/2021 MRN: [id number]  DOB: 03/28/1966  Patient Care Team: Celene Squibb, MD as PCP - General (Internal Medicine)    REASON FOR CONSULTATION: Natalie Contreras is a 56 y.o. female with medical history including metastatic renal cell carcinoma with mets to liver, diffuse chest and abdominal wall soft tissue metastasis, brain metastasis, and bilateral lung involvement.  Patient was recently hospitalized and discharged home..  Of note during hospitalization TOC initially attempted to arrange for placement however with no bed offers or home health availability patient was discharged home expressing ability to provide care for herself. Palliative ask to see for symptom management and goals of care.    SOCIAL HISTORY:     reports that she has been smoking cigarettes. She started smoking about 37 years ago. She has a 18.75 pack-year smoking history. She has never used smokeless tobacco. She reports that she does not drink alcohol and does not use drugs.  ADVANCE DIRECTIVES:  No advanced directives.  Patient expresses wishes to complete documents however given questionable confusion advised this cannot be completed at this time but medical team will closely evaluate for ability to complete.  Patient has expressed that her son [name] be her primary point of contact and main medical decision maker in absence of such document.  CODE STATUS: DNR  PAST MEDICAL HISTORY: Past Medical History:  Diagnosis Date   Abscess of foot 08/09/2019   WITH ULCER  RIGHT FOOT   Acute diastolic CHF (congestive heart failure) ([place]) 10/28/2016   Brain cancer ([place])    Cancer of kidney (HCC)    Carotid stenosis, asymptomatic, bilateral 01/15/2017   Diabetes mellitus without complication (HCC)    Heart murmur    Hypertension    PAD (peripheral artery disease) ([place]) 04/10/2017   Left ABI  0.74. Right ABI 0.96.   Stomach cancer [facility])     PAST SURGICAL HISTORY:  Past Surgical History:  Procedure Laterality Date   ABDOMINAL AORTOGRAM W/LOWER EXTREMITY N/A 08/10/2019   Procedure: ABDOMINAL AORTOGRAM W/LOWER EXTREMITY;  Surgeon: Waynetta Sandy, MD;  Location: [place] [facility];  Service: Cardiovascular;  Laterality: N/A;   AMPUTATION Right 09/03/2019   Procedure: RIGHT BELOW KNEE AMPUTATION;  Surgeon: Newt Minion, MD;  Location: [place];  Service: Orthopedics;  Laterality: Right;   AMPUTATION TOE Left 05/05/2017   Procedure: LEFT SECOND TOE AMPUTATION;  Surgeon: Aviva Signs, MD;  Location: [facility];  Service: General;  Laterality: Left;   BIOPSY  10/16/2021   Procedure: BIOPSY;  Surgeon: Sharyn Creamer, MD;  Location: [facility] ENDOSCOPY;  Service: Gastroenterology;;   CATARACT EXTRACTION W/PHACO Left 02/08/2016   Procedure: CATARACT EXTRACTION PHACO AND INTRAOCULAR LENS PLACEMENT ([place]);  Surgeon: Tonny Branch, MD;  Location: [facility];  Service: Ophthalmology;  Laterality: Left;  CDE 11.43    CATARACT EXTRACTION W/PHACO Right 02/26/2016   Procedure: CATARACT EXTRACTION PHACO AND INTRAOCULAR LENS PLACEMENT RIGHT; CDE:  16.90;  Surgeon: Tonny Branch, MD;  Location: [facility];  Service: Ophthalmology;  Laterality: Right;   CHOLECYSTECTOMY     ESOPHAGOGASTRODUODENOSCOPY (EGD) WITH PROPOFOL N/A 10/16/2021   Procedure: ESOPHAGOGASTRODUODENOSCOPY (EGD) WITH PROPOFOL;  Surgeon: Sharyn Creamer, MD;  Location: [place];  Service: Gastroenterology;  Laterality: N/A;   FOREIGN BODY REMOVAL Left 04/09/2017   Procedure: FOREIGN BODY REMOVAL ADULT FOOT;  Surgeon: Aviva Signs, MD;  Location: [facility];  Service: General;  Laterality: Left;   I & D EXTREMITY Right 08/11/2019   Procedure: IRRIGATION AND DEBRIDEMENT EXTREMITY;  Surgeon: Newt Minion, MD;  Location: [place];  Service: Orthopedics;  Laterality: Right;   PERIPHERAL VASCULAR ATHERECTOMY Right 08/10/2019   Procedure: PERIPHERAL VASCULAR  ATHERECTOMY;  Surgeon: Waynetta Sandy, MD;  Location: [place] [facility];  Service: Cardiovascular;  Laterality: Right;  superficial femoral   PERIPHERAL VASCULAR BALLOON ANGIOPLASTY Right 08/10/2019   Procedure: PERIPHERAL VASCULAR BALLOON ANGIOPLASTY;  Surgeon: Waynetta Sandy, MD;  Location: [place] [facility];  Service: Cardiovascular;  Laterality: Right;  anterior tibial   TRANSMETATARSAL AMPUTATION Left 07/21/2017   Procedure: TRANSMETATARSAL AMPUTATION LEFT FOOT;  Surgeon: Aviva Signs, MD;  Location: [facility];  Service: General;  Laterality: Left;    HEMATOLOGY/ONCOLOGY HISTORY:  Oncology History   No history exists.    ALLERGIES:  has No Known Allergies.  MEDICATIONS:  Current Outpatient Medications  Medication Sig Dispense Refill   acetaminophen (TYLENOL) 325 MG tablet Take 1-2 tablets (325-650 mg total) by mouth every 4 (four) hours as needed for mild pain.     carvedilol (COREG) 6.25 MG tablet Take 1 tablet (6.25 mg total) by mouth 2 (two) times daily with a meal. 609 tablet 2   cephALEXin (KEFLEX) 500 MG capsule Take 1 capsule (500 mg total) by mouth 3 (three) times daily. 15 capsule 0   Continuous Blood Gluc Sensor (DEXCOM G6 SENSOR) MISC Inject 1 Device into the skin See admin instructions. Place 1 device/sensor into the skin every 10 days, after removing the former one     dexamethasone (DECADRON) 4 MG tablet Take 1 tablet (4 mg total) by mouth 3 (three) times daily for 10 days. 30 tablet 0   docusate sodium (COLACE) 100 MG capsule Take 1 capsule (100 mg total) by mouth 2 (two) times daily. (Patient taking differently: Take 100 mg by mouth 2 (two) times daily as needed for mild constipation.) 60 capsule 0   ergocalciferol (VITAMIN D2) 1.25 MG (50000 UT) capsule Take 50,000 Units by mouth every Sunday.     furosemide (LASIX) 40 MG tablet Take 1 tablet (40 mg total) by mouth daily. 30 tablet 2   hydrALAZINE (APRESOLINE) 25 MG tablet Take 1 tablet (25 mg  total) by mouth every 8 (eight) hours. 90 tablet 2   hydrocortisone (ANUSOL-HC) 25 MG suppository Place 1 suppository (25 mg total) rectally 2 (two) times daily. 12 suppository 0   insulin aspart (NOVOLOG) 100 UNIT/ML injection Inject 100 Units into the skin See admin instructions. Up to 100 units per day in omni pod. Omnipod to be filled with Novolog to be accurately injected by Omnipod based on settings.     Insulin Disposable Pump (OMNIPOD DASH PODS, GEN 4,) MISC Inject into the skin continuous.     JARDIANCE 25 MG TABS tablet Take 25 mg by mouth daily.     LORazepam (ATIVAN) 1 MG tablet Take one tablet 40 minutes prior to radiation treatment.  If you do not feel calmer after 20 minutes, you can take a 2nd tablet prior to radiation treatment. Need driver while on this med. 4 tablet 0   Multiple Vitamin (MULTIVITAMIN WITH MINERALS) TABS tablet Take 1 tablet by mouth daily.     ondansetron (ZOFRAN) 4 MG tablet Take 1 tablet (4 mg total) by mouth every 6 (six) hours as needed for nausea. 30 tablet 0   oxyCODONE-acetaminophen (PERCOCET/ROXICET) 5-325 MG tablet Take 1 tablet by mouth every 6 (six) hours  as needed for moderate pain or severe pain. 15 tablet 0   pantoprazole (PROTONIX) 40 MG tablet Take 1 tablet (40 mg total) by mouth 2 (two) times daily. 60 tablet 0   polyethylene glycol (MIRALAX / GLYCOLAX) 17 g packet Take 17 g by mouth 2 (two) times daily. (Patient taking differently: Take 17 g by mouth 2 (two) times daily as needed.) 60 each 0   simvastatin (ZOCOR) 40 MG tablet Take 40 mg by mouth at bedtime.      sucralfate (CARAFATE) 1 GM/10ML suspension Take 10 mLs (1 g total) by mouth 4 (four) times daily -  with meals and at bedtime. 1200 mL 0   No current facility-administered medications for this visit.    VITAL SIGNS: LMP 12/16/2015  There were no vitals filed for this visit.  Estimated body mass index is 35.99 kg/m as calculated from the following:   Height as of 10/31/21: 5\' 4"  (1.626  m).   Weight as of 10/31/21: 209 lb 10.5 oz (95.1 kg).  LABS: CBC:    Component Value Date/Time   WBC 9.3 10/31/2021 1620   HGB 9.0 (L) 10/31/2021 1620   HCT 31.5 (L) 10/31/2021 1620   PLT 385 10/31/2021 1620   MCV 83.6 10/31/2021 1620   NEUTROABS 4.0 10/23/2021 1320   LYMPHSABS 1.1 10/23/2021 1320   MONOABS 0.4 10/23/2021 1320   EOSABS 0.1 10/23/2021 1320   BASOSABS 0.0 10/23/2021 1320   Comprehensive Metabolic Panel:    Component Value Date/Time   NA 134 (L) 10/31/2021 1620   K 4.0 10/31/2021 1620   CL 106 10/31/2021 1620   CO2 18 (L) 10/31/2021 1620   BUN 69 (H) 10/31/2021 1620   CREATININE 1.22 (H) 10/31/2021 1620   CREATININE 0.83 03/04/2017 1637   GLUCOSE 187 (H) 10/31/2021 1620   CALCIUM 8.7 (L) 10/31/2021 1620   AST 17 10/31/2021 1620   ALT 31 10/31/2021 1620   ALKPHOS 160 (H) 10/31/2021 1620   BILITOT 0.4 10/31/2021 1620   PROT 6.3 (L) 10/31/2021 1620   ALBUMIN 3.1 (L) 10/31/2021 1620    RADIOGRAPHIC STUDIES: DG Chest 2 View  Result Date: 10/13/2021 CLINICAL DATA:  smoker. weakness. rule out infection/malignancy EXAM: CHEST - 2 VIEW COMPARISON:  April 07, 2017 FINDINGS: The cardiomediastinal silhouette is enlarged in contour.Atherosclerotic calcifications. No pleural effusion. No pneumothorax. Multiple nodular opacities throughout bilateral lungs. Surgical clips project over the upper abdomen. Suggestion of sclerosis of several vertebral bodies. IMPRESSION: 1. Multiple nodular opacities bilaterally. Suggestion of multifocal sclerosis of the vertebral bodies versus summation artifact. Findings are concerning for multifocal metastases. Recommend further dedicated evaluation with cross-sectional imaging. Electronically Signed   By: Valentino Saxon M.D.   On: 10/13/2021 15:29   CT Head Wo Contrast  Result Date: 10/24/2021 CLINICAL DATA:  Brain/CNS neoplasm to assess treatment response. EXAM: CT HEAD WITHOUT CONTRAST TECHNIQUE: Contiguous axial images were obtained  from the base of the skull through the vertex without intravenous contrast. RADIATION DOSE REDUCTION: This exam was performed according to the departmental dose-optimization program which includes automated exposure control, adjustment of the mA and/or kV according to patient size and/or use of iterative reconstruction technique. COMPARISON:  MRI brain 10/19/2021.  CT head 10/13/2021 FINDINGS: Brain: Multiple intracranial metastases are again demonstrated. Largest is a heterogeneous mixed density but mostly hyperdense lesion in the left parietal lobe measuring 3 x 3.7 cm diameter. Size measures larger than on previous CT. There is surrounding vasogenic edema as before. Multiple additional mostly hyperdense lesions  are also demonstrated. Lesions are more numerous and larger than on the previous CT but were demonstrated on the prior MRI. Hyperdensity in the lesions suggest hemorrhagic component. No mass effect or midline shift. No abnormal extra-axial fluid collections. Ventricles are not dilated. Vascular: Intracranial arterial vascular calcifications are present. Skull: Normal. Negative for fracture or focal lesion. Sinuses/Orbits: Paranasal sinuses and mastoid air cells are clear. Other: None. IMPRESSION: Multiple intracranial metastases, some hemorrhagic, and associated vasogenic white matter edema demonstrating progression since prior CT, possibly due to intervening hemorrhage within the lesions. Size and distribution of the lesions is similar to the interval MRI. Greater than 10 lesions are present. No acute mass effect. Electronically Signed   By: Lucienne Capers M.D.   On: 10/24/2021 00:32   CT Head Wo Contrast  Result Date: 10/13/2021 CLINICAL DATA:  dizziness. blurred vision. EXAM: CT HEAD WITHOUT CONTRAST TECHNIQUE: Contiguous axial images were obtained from the base of the skull through the vertex without intravenous contrast. RADIATION DOSE REDUCTION: This exam was performed according to the  departmental dose-optimization program which includes automated exposure control, adjustment of the mA and/or kV according to patient size and/or use of iterative reconstruction technique. COMPARISON:  February 17, 2017. FINDINGS: Brain: There is a lesion in the LEFT parieto-occipital lobe with adjacent vasogenic edema and a peripheral rim of intrinsic hyperdensity. It measures 2.6 x 2.4 by 2.7 cm. There is an additional intrinsically hyperdense region along the RIGHT frontoparietal falx which measures 7 by 5 mm which is concerning for an additional site of disease (series 2, image 23). Hyperdensity of the LEFT frontal lobe measuring approximately 5 mm likely reflects an additional site of disease (series 2, image 25). Hypodensity of the RIGHT occipital lobe with a small amount of intrinsic hyperdensity likely reflecting additional lesion (series 2, image 12; series 5, image 28). Hypodensity of the LEFT inferior cerebellum, consistent with the sequela of remote prior infarction and similar comparison to prior. No significant midline shift. Vascular: Vascular calcifications of the carotid siphons. Skull: No acute fracture. Sinuses/Orbits: LEFT mastoid effusion. Other: None. IMPRESSION: 1. There is a 2.7 cm peripherally hyperdense masslike area in the LEFT parieto-occipital lobe with adjacent vasogenic edema. 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 Recommend further evaluation with dedicated brain MRI with and without contrast. 2. There are likely additional lesions in the bilateral cerebral hemispheres. These results were called by telephone at the time of interpretation on 10/13/2021 at 3:20 pm to provider MARGAUX VENTER , who verbally acknowledged these results. Electronically Signed   By: Valentino Saxon M.D.   On: 10/13/2021 15:28   MR Brain W Wo Contrast  Result Date: 10/20/2021 CLINICAL DATA:  Brain metastases, unknown primary 3T SRS Protocol for treatment planning EXAM: MRI HEAD WITHOUT AND WITH  CONTRAST TECHNIQUE: Multiplanar, multiecho pulse sequences of the brain and surrounding structures were obtained without and with intravenous contrast. CONTRAST:  42mL GADAVIST GADOBUTROL 1 MMOL/ML IV SOLN COMPARISON:  10/15/2021 FINDINGS: Brain: Multiple parenchymal masses are again identified. Possible dural lesion along the falx is also again noted. In the interval, there has been evolution of hemorrhage associated with the lesions that now demonstrate significant intrinsic T1 shortening including within the surrounding area of edema. Therefore, evaluation for enhancement is now limited. Greater than 10 lesions are present. A few were not visible on the prior study or are new. For example, along the body of the caudate on the right (series 1100, image 178) and several punctate lesions in the right cerebral hemisphere (  images 178, 181, 192, 197, and 202). Many of these lesions demonstrate corresponding diffusion hyperintensity. There also additional foci of diffusion hyperintensity in the frontoparietal white matter bilaterally without definite corresponding enhancement. This was also present on the prior study. The right caudate body lesion was visible on SWI on the prior study. There is trace subdural hemorrhage along the cerebral convexities posteriorly. Likely minimal sulcal subarachnoid hemorrhage associated with the dominant left parieto-occipital lesion. No significant change in mass effect. Vascular: Major vessel flow voids at the skull base are preserved. Skull and upper cervical spine: Normal marrow signal is preserved. Sinuses/Orbits: Paranasal sinuses are aerated. Orbits are unremarkable. Other: Sella is unremarkable.  Mastoid air cells are clear. IMPRESSION: Multiple (greater than 10) hemorrhagic lesions are present with evolution of intralesional hemorrhage in the interval. There is T1 hyperintensity of the perilesional edema, a finding more commonly associated with cavernous malformations, but these  are presumed to reflect metastases in the setting of renal malignancy. A few of the lesions were not visible on the prior study or are new. Many of these demonstrate corresponding diffusion hyperintensity. Therefore, subacute infarcts are consideration for this subset. Electronically Signed   By: Macy Mis M.D.   On: 10/20/2021 14:05   MR BRAIN W WO CONTRAST  Result Date: 10/16/2021 CLINICAL DATA:  CNS neoplasm, [facility] protocol EXAM: MRI HEAD WITHOUT AND WITH CONTRAST TECHNIQUE: Multiplanar, multiecho pulse sequences of the brain and surrounding structures were obtained without and with intravenous contrast. CONTRAST:  8mL GADAVIST GADOBUTROL 1 MMOL/ML IV SOLN COMPARISON:  No prior MRI, correlation is made with 10/13/2021 CT head FINDINGS: Evaluation is limited by motion artifact. Brain: Multiple enhancing masses in the bilateral cerebral hemispheres, the largest of which is in the left parietooccipital lobe and measures up to 3.3 x 2.9 x 3.3 cm (AP x TR x CC) (series 9, image 92). Additional smaller lesions include a left parietal lesion that measures 0.7 x 0.5 x 0.7 cm (series 9, image 39 and series 10, image 29), a left frontal lobe lesion that measures 0.6 x 0.4 x 0.6 cm (series 9, image 139 and series 10, image 43), a right frontal lobe lesion that measures 0.3 x 0.4 x 0.4 cm (series 9, image 50 and series 10, image 52), a right extra-axial lesion along the falx adjacent to the right parietal lobe that measures 1.0 x 0.5 x 1.0 cm (series 9, image 60 and series 10, image 23), right parietal lesion that measures 1.0 x 0.9 x 1.0 cm (series 9, image 80 and series 10, image 20), a left parietal lesion that measures 0.4 x 0.8 x 0.5 cm (series 9, image 82 and series 10, image 22), a left occipital lesion that measures 0.5 x 0.8 x 0.6 cm (series 9, image 124 and series 10, image 8), and a right occipital lesion (or possibly 2 adjacent lesions) that measures 1.2 x 1.7 x 0.9 cm (series 9, image 128 and series  10, image 8). Possible additional lesions in the right frontal lobe adjacent to the right lateral ventricle, which is faintly visualized on the coronal postcontrast sequences (series 10, image 32), measuring 0.6 x 0.5 cm, and in the atrium of the right lateral ventricle (series 11, image 26), measuring approximately 0.4 cm, best seen on the sagittal sequence. These lesions are associated with surrounding T2 hyperintense signal, likely edema, with the largest area of edema surrounding the dominant left parieto-occipital lesion. Several of these lesions are also associated with hemosiderin deposition (series 6, image  54, 76, 81, and 82). Two rounded areas of hemosiderin deposition in the left lateral ventricle atrium (series 6, image 76 and 69) are difficult to find definite enhancing lesions to correlate with on the axial sequences, given enhancement in the choroid plexus, but possible correlates are suspected on the sagittal postcontrast sequence (series 11, image 24 and 26). No other areas of parenchymal hemorrhage. No definite midline shift. 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 No hydrocephalus or extra-axial collection. No diffusion-weighted sequence was available to evaluate for infarct given the selected protocol. Vascular: The left PCA flow void is not well visualized, however the vessel is seen on postcontrast imaging. Otherwise normal flow voids. Skull and upper cervical spine: Normal marrow signal. Sinuses/Orbits: Status post bilateral lens replacements. Otherwise negative. Other: Trace fluid in the mastoid air cells. IMPRESSION: Evaluation is limited by motion artifact. Within this limitation, multiple enhancing lesions in the bilateral cerebral hemispheres, with additional extra-axial lesion along the right parietal lobe and possible lesions in the atrium of the left lateral ventricle. Several of these lesions demonstrate associated edema and intralesional hemorrhage,  including the dominant left parieto-occipital lesion. These are overall concerning for metastatic disease. Additional lesions may be present, but obscured by the motion artifact. Electronically Signed   By: Merilyn Baba M.D.   On: 10/16/2021 02:05   CT CHEST ABDOMEN PELVIS W CONTRAST  Result Date: 10/13/2021 CLINICAL DATA:  Brain metastasis of unknown primary. EXAM: CT CHEST, ABDOMEN, AND PELVIS WITH CONTRAST TECHNIQUE: Multidetector CT imaging of the chest, abdomen and pelvis was performed following the standard protocol during bolus administration of intravenous contrast. RADIATION DOSE REDUCTION: This exam was performed according to the departmental dose-optimization program which includes automated exposure control, adjustment of the mA and/or kV according to patient size and/or use of iterative reconstruction technique. CONTRAST:  51mL OMNIPAQUE IOHEXOL 300 MG/ML  SOLN COMPARISON:  None. FINDINGS: CT CHEST FINDINGS Cardiovascular: Mild cardiomegaly. Aortic and coronary atherosclerotic calcification noted. Mediastinum/Lymph Nodes: Mild bilateral hilar lymphadenopathy is seen. No pathologically enlarged mediastinal or axillary lymph nodes are identified. Lungs/Pleura: Multiple solid bilateral pulmonary nodules are seen, consistent with diffuse pulmonary metastases. Index nodule in the medial right lower lobe measures 2.9 x 1.7 cm on image 92/3. Index nodule in the posterolateral left lower lobe measures 4.4 x 2.2 cm on image 109/3. Small left pleural effusion also seen. Musculoskeletal: Multiple chest wall soft tissue masses are seen bilaterally, involving the breasts, chest wall muscles and subcutaneous tissues. The 2 largest masses involve the right pectoralis major muscle, measuring 3.3 x 2.9 cm on image 12/2, and the right supraspinatus muscle measuring 5.7 x 3.2 cm on image 21/2. CT ABDOMEN AND PELVIS FINDINGS Hepatobiliary: Multiple hypovascular masses are seen throughout the right and left hepatic  lobes, largest in the lateral dome of the right hepatic lobe measuring 10.7 x 5.1 cm. These are consistent diffuse liver metastases. Prior cholecystectomy. No evidence of biliary obstruction. Pancreas:  No mass or inflammatory changes. Spleen:  Within normal limits in size and appearance. Adrenals/Urinary tract: A small right adrenal mass is seen measuring 2.0 x 1.5 cm, which is indeterminate but suspicious for adrenal metastasis. A large heterogeneously enhancing mass is seen involving the posterior left kidney, which shows invasion of the posterior abdominal wall soft tissues. This measures 10.3 x 8.9 cm, and is consistent with primary renal cell carcinoma. Smaller enhancing soft tissue nodules are also seen in the left posterior pararenal space, consistent with metastatic disease. Two small masses  are seen along the capsular surface of the anterior mid and lower poles of the right kidney, which measure 2.2 cm and 1.5 cm in diameter. These could represent synchronous primary renal cell carcinomas of the right kidney, or metastatic disease. Two soft tissue nodules are involve the lateral portion of Gerota's fascia on the right, largest measuring 2.2 x 1.6 cm. A subcapsular cyst measuring 4 cm is also seen in the lateral interpolar region of the right kidney. Stomach/Bowel: No evidence of obstruction, inflammatory process, or abnormal fluid collections. Vascular/Lymphatic: Mild lymphadenopathy is seen in the left paraaortic region, largest measuring 10 mm on image 67/2. A 1.3 cm lymph node is seen in the deep left abdominal small bowel mesentery adjacent to the duodenum. No pelvic lymphadenopathy identified. Aortic atherosclerotic calcification noted. Left retroaortic renal vein is noted, however there is no evidence of renal vein or IVC thrombus. Reproductive: No masses identified. Small amount of gas noted within the endometrial cavity uterus. Other:  None. Musculoskeletal: No suspicious bone lesions identified.  Multiple small masses are seen scattered throughout the abdominal wall soft tissues, consistent with metastatic disease. IMPRESSION: 10 cm left renal mass with invasion of the left posterior abdominal wall soft tissues, consistent with primary renal cell carcinoma. Adjacent metastatic soft tissue nodules also noted within the left perinephric space. Diffuse bilateral pulmonary metastases. Two small masses along the capsular surface of the right kidney, which may represent synchronous primary renal cell carcinomas or metastatic disease. Metastatic disease also involving Gerota's fascia on the right. Mild abdominal lymphadenopathy, consistent with metastatic disease. Diffuse liver metastases. Small right adrenal mass, suspicious for metastatic disease. Diffuse bilateral pulmonary metastases. 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 Electronically Signed   By: Marlaine Hind M.D.   On: 10/13/2021 17:53   ECHOCARDIOGRAM COMPLETE  Result Date: 10/25/2021    ECHOCARDIOGRAM REPORT   Patient Name:   AJEENAH HEINY Date of Exam: 10/25/2021 Medical Rec #:  [id number]       Height:       64.0 in Accession #:    [id number]      Weight:       205.7 lb Date of Birth:  [birth year]      BSA:          1.980 m Patient Age:    31 years        BP:           143/61 mmHg Patient Gender: F               HR:           77 bpm. Exam Location:  Inpatient Procedure: 2D Echo Indications:    Acute diastolic CHF  History:        Patient has prior history of Echocardiogram examinations, most                 recent 10/29/2016. CHF; Risk Factors:Diabetes and Hypertension.  Sonographer:    Arlyss Gandy Referring Phys: [id number] [place]  1. Left ventricular ejection fraction, by estimation, is 55 to 60%. The left ventricle has normal function. The left ventricle has no regional wall motion abnormalities. Left ventricular diastolic parameters are consistent with Grade III diastolic  dysfunction (restrictive). Elevated left ventricular end-diastolic pressure.  2. Right ventricular systolic function is normal. The right ventricular size is mildly enlarged. There is severely elevated pulmonary artery systolic pressure. The estimated right ventricular systolic pressure is 57.3 mmHg.  3. Left atrial size was moderately dilated.  4. Right atrial size was mildly dilated.  5. The mitral valve is grossly normal. Mild to moderate mitral valve regurgitation.  6. Tricuspid valve regurgitation is mild to moderate.  7. The aortic valve is tricuspid. There is mild calcification of the aortic valve. There is mild thickening of the aortic valve. Aortic valve regurgitation is not visualized. Aortic valve sclerosis/calcification is present, without any evidence of aortic stenosis.  8. Aortic aortic arch not well visualized.  9. The inferior vena cava is dilated in size with <50% respiratory variability, suggesting right atrial pressure of 15 mmHg. Comparison(s): Changes from prior study are noted. Severely elevated pulmonary pressures on current study. FINDINGS  Left Ventricle: Left ventricular ejection fraction, by estimation, is 55 to 60%. The left ventricle has normal function. The left ventricle has no regional wall motion abnormalities. The left ventricular internal cavity size was normal in size. There is  borderline left ventricular hypertrophy. Left ventricular diastolic parameters are consistent with Grade III diastolic dysfunction (restrictive). Elevated left ventricular end-diastolic pressure. Right Ventricle: The right ventricular size is mildly enlarged. Right vetricular wall thickness was not well visualized. Right ventricular systolic function is normal. There is severely elevated pulmonary artery systolic pressure. The tricuspid regurgitant velocity is 3.64 m/s, and with an assumed right atrial pressure of 15 mmHg, the estimated right ventricular systolic pressure is 45.8 mmHg. Left Atrium: Left  atrial size was moderately dilated. Right Atrium: Right atrial size was mildly dilated. Pericardium: There is no evidence of pericardial effusion. Mitral Valve: The mitral valve is grossly normal. There is mild thickening of the mitral valve leaflet(s). There is mild calcification of the mitral valve leaflet(s). Mild to moderate mitral annular calcification. Mild to moderate mitral valve regurgitation. MV peak gradient, 11.0 mmHg. The mean mitral valve gradient is 4.0 mmHg. Tricuspid Valve: The tricuspid valve is grossly normal. Tricuspid valve regurgitation is mild to moderate. No evidence of tricuspid stenosis. Aortic Valve: The aortic valve is tricuspid. There is mild calcification of the aortic valve. There is mild thickening of the aortic valve. Aortic valve regurgitation is not visualized. Aortic valve sclerosis/calcification is present, without any evidence of aortic stenosis. Aortic valve mean gradient measures 9.5 mmHg. Aortic valve peak gradient measures 16.6 mmHg. Aortic valve area, by VTI measures 1.50 cm. Pulmonic Valve: The pulmonic valve was not well visualized. Pulmonic valve regurgitation is not visualized. No evidence of pulmonic stenosis. Aorta: The aortic root, ascending aorta, aortic arch and descending aorta are all structurally normal, with no evidence of dilitation or obstruction and aortic arch not well visualized. Venous: The inferior vena cava is dilated in size with less than 50% respiratory variability, suggesting right atrial pressure of 15 mmHg. IAS/Shunts: The atrial septum is grossly normal.  LEFT VENTRICLE PLAX 2D LVIDd:         5.10 cm   Diastology LVIDs:         3.50 cm   LV e' medial:    6.53 cm/s LV PW:         1.10 cm   LV E/e' medial:  23.9 LV IVS:        1.10 cm   LV e' lateral:   8.59 cm/s LVOT diam:     2.00 cm   LV E/e' lateral: 18.2 LV SV:         78 LV SV Index:   40 LVOT Area:     3.14 cm  RIGHT VENTRICLE  IVC RV Basal diam:  4.10 cm     IVC diam: 2.50 cm  RV Mid diam:    3.20 cm RV S prime:     11.60 cm/s TAPSE (M-mode): 1.8 cm LEFT ATRIUM             Index        RIGHT ATRIUM           Index LA diam:        4.20 cm 2.12 cm/m   RA Area:     17.30 cm LA Vol (A2C):   76.3 ml 38.54 ml/m  RA Volume:   44.50 ml  22.48 ml/m LA Vol (A4C):   61.1 ml 30.86 ml/m LA Biplane Vol: 68.5 ml 34.60 ml/m  AORTIC VALVE AV Area (Vmax):    1.51 cm AV Area (Vmean):   1.47 cm AV Area (VTI):     1.50 cm AV Vmax:           204.00 cm/s AV Vmean:          144.000 cm/s AV VTI:            0.522 m AV Peak Grad:      16.6 mmHg AV Mean Grad:      9.5 mmHg LVOT Vmax:         97.90 cm/s LVOT Vmean:        67.500 cm/s LVOT VTI:          0.249 m LVOT/AV VTI ratio: 0.48  AORTA Ao Asc diam: 2.90 cm MITRAL VALVE                TRICUSPID VALVE MV Area (PHT): 3.42 cm     TR Peak grad:   53.0 mmHg MV Area VTI:   1.96 cm     TR Vmax:        364.00 cm/s MV Peak grad:  11.0 mmHg MV Mean grad:  4.0 mmHg     SHUNTS MV Vmax:       1.66 m/s     Systemic VTI:  0.25 m MV Vmean:      85.4 cm/s    Systemic Diam: 2.00 cm MV Decel Time: 222 msec MV E velocity: 156.00 cm/s MV A velocity: 71.00 cm/s MV E/A ratio:  2.20 Buford Dresser MD Electronically signed by Buford Dresser MD Signature Date/Time: 10/25/2021/8:05:43 PM    Final    [place] CORE BIOPSY (SOFT TISSUE)  Result Date: 10/17/2021 INDICATION: 56 year old female with history of left renal mass and multiple diffuse metastatic lesions. EXAM: Ultrasound-guided soft tissue mass biopsy MEDICATIONS: None. ANESTHESIA/SEDATION: Moderate (conscious) sedation was employed during this procedure. A total of Versed 1 mg and Fentanyl 25 mcg was administered intravenously. Moderate Sedation Time: 8 minutes. The patient's level of consciousness and vital signs were monitored continuously by radiology nursing throughout the procedure under my direct supervision. FLUOROSCOPY TIME:  None. COMPLICATIONS: None immediate. PROCEDURE: Informed written consent was  obtained from the patient after a thorough discussion of the procedural risks, benefits and alternatives. All questions were addressed. Maximal Sterile Barrier Technique was utilized including caps, mask, sterile gowns, sterile gloves, sterile drape, hand hygiene and skin antiseptic. A timeout was performed prior to the initiation of the procedure. Preprocedure ultrasound evaluation of the right posterior flank demonstrates subcutaneous hypoechoic soft tissue nodule measuring approximately 2.0 x 1.0 cm. Procedure was planned. Subdermal Local anesthesia was provided at the planned needle entry site. Deeper local anesthetic was administered under ultrasound guidance to  the periphery of the targeted mass. A small skin nick was made. Under direct ultrasound visualization, a 17 gauge introducer needle was directed to the periphery of the mass. Next, a total of 3, 18 gauge core biopsies were obtained and placed in formalin. The introducer needle was removed. Postprocedure image demonstrated no evidence of surrounding hematoma or other procedure related complication. The patient tolerated the procedure well and was transferred back to the floor in stable condition. IMPRESSION: Technically successful ultrasound-guided core needle biopsy of right posterior flank subcutaneous soft tissue mass. Ruthann Cancer, MD [facility] Electronically Signed   By: Ruthann Cancer M.D.   On: 10/17/2021 12:02   VAS [place] LOWER EXTREMITY VENOUS (DVT) (7a-7p)  Result Date: 10/24/2021  Lower Venous DVT Study Patient Name:  AYDIN CAVALIERI  Date of Exam:   10/23/2021 Medical Rec #: [id number]        Accession #:    [id number] Date of Birth: 1966/07/31       Patient Gender: F Patient Age:   8 years Exam Location:  [facility] Procedure:      VAS [place] LOWER EXTREMITY VENOUS (DVT) Referring Phys: Wille Glaser KNAPP --------------------------------------------------------------------------------   Indications: Swelling of left lower extremity.  Limitations: Poor ultrasound/tissue interface. Comparison Study: 07-17-2017 Prior bilateral lower extremity venous was negative                   for DVT. Performing Technologist: Darlin Coco RDMS, RVT  Examination Guidelines: A complete evaluation includes B-mode imaging, spectral Doppler, color Doppler, and power Doppler as needed of all accessible portions of each vessel. Bilateral testing is considered an integral part of a complete examination. Limited examinations for reoccurring indications may be performed as noted. The reflux portion of the exam is performed with the patient in reverse Trendelenburg.  +-----+---------------+---------+-----------+----------+--------------+  RIGHT Compressibility Phasicity Spontaneity Properties Thrombus Aging  +-----+---------------+---------+-----------+----------+--------------+  CFV   Full            Yes       Yes                                    +-----+---------------+---------+-----------+----------+--------------+   +---------+---------------+---------+-----------+----------+-------------------+  LEFT      Compressibility Phasicity Spontaneity Properties Thrombus Aging       +---------+---------------+---------+-----------+----------+-------------------+  CFV       Full            Yes       Yes                                         +---------+---------------+---------+-----------+----------+-------------------+  SFJ       Full                                                                  +---------+---------------+---------+-----------+----------+-------------------+  FV Prox   Full                                                                  +---------+---------------+---------+-----------+----------+-------------------+  FV Mid    Full                                                                  +---------+---------------+---------+-----------+----------+-------------------+  FV Distal Full                                                                   +---------+---------------+---------+-----------+----------+-------------------+  PFV       Full                                                                  +---------+---------------+---------+-----------+----------+-------------------+  POP       Full            Yes       Yes                                         +---------+---------------+---------+-----------+----------+-------------------+  PTV                                                        Not well visualized  +---------+---------------+---------+-----------+----------+-------------------+  PERO                                                       Not well visualized  +---------+---------------+---------+-----------+----------+-------------------+    Summary: RIGHT: - No evidence of common femoral vein obstruction.  LEFT: - There is no evidence of deep vein thrombosis in the lower extremity. However, portions of this examination were limited- see technologist comments above.  - No cystic structure found in the popliteal fossa.  *See table(s) above for measurements and observations. Electronically signed by Harold Barban MD on 10/24/2021 at 8:53:54 PM.    Final     PERFORMANCE STATUS (ECOG) : 4 - Bedbound  Review of Systems  Constitutional:  Positive for appetite change.  Cardiovascular:  Positive for leg swelling.  Gastrointestinal:  Positive for diarrhea.  Musculoskeletal:  Positive for arthralgias.  Neurological:  Positive for weakness.  Psychiatric/Behavioral:         Memory defecit Incontinence  Unless otherwise noted, a complete review of systems is negative.  Physical Exam General: Chronically-ill appearing, frail, appears unkept  Cardiovascular: regular rate and rhythm Pulmonary: diminished bilaterally, bilateral palpable nodules to the chest wall, tender to touch Abdomen: soft, nontender, + bowel sounds Extremities: R BKA, left lower extremity edema, toe  amputations Skin: no rashes  Neurological: Weakness, AAOx4 to all orientation, some memory deficit vs confusion during conversations   IMPRESSION: This is my initial visit with Ms. Smejkal. She presented to [place] center for radiation treatment.  Patient appears weak and unkept.  Complains of generalized pain.  Is totally bedridden and required nonemergent EMS transport to her appointment today.  Her son [name] is present expressing ongoing concerns.   I introduced myself, Advertising copywriter, and Palliative's role in collaboration with the oncology team. Concept of Palliative Care was introduced as specialized medical care for people and their families living with serious illness.  It focuses on providing relief from the symptoms and stress of a serious illness.  The goal is to improve quality of life for both the patient and the family. Values and goals of care important to patient and family were attempted to be elicited.   Ms. Kirker lives in the home alone.  She has been bedbound due to lower extremity weakness and inability to walk despite use of prosthesis.  She has 3 children.  Her daughter Julianne Rice generally has been the one handling all of her affairs however there seems to be a lack in care and follow-up.  Patient has been left alone in her home since discharging from the hospital on Monday.  Has not eaten/bathed/or received any daily care.  Reports she took what medication she had in the home.  Patient was evaluated at [facility] yesterday after initial expectation was to be transported to the hospital for radiation treatment.  Patient reports EMS could not get her in a wheelchair and chose to take her to the local ER for further evaluation.  Labs stable at that time and patient was discharged back home.  Son [name] is present today and is emotional in regards to his mother's ongoing decline.  Reports his sister has not been available and he is unaware of the extent of his mother's  condition until he showed up to check on her this morning.  We had a detailed conversation regarding patient's metastatic cancer diagnosis.  Son is appropriately distressed regarding updates.  He is unable to provide the care his mother needs in the home and is very much concerned about her safety and wellbeing.  Patient acknowledges she does have a close friend, Izora Gala who does assist her with some care when able but also with her own health challenges.  We discussed Her current illness and what it means in the larger context of Her on-going co-morbidities. Natural disease trajectory and expectations were discussed.  Patient is currently undergoing radiation treatments with 3 treatments remaining.  I approached empathetically discussions regarding goals of care, expectations, and end-of-life wishes.  Patient and son are clear and expressed wishes for patient not to suffer.  They understand given her poor performance and poor long-term prognosis patient most likely will not have a meaningful recovery.  Son expresses understanding and is inquiring about hospice support.  Shares he actually reached out himself this morning after seeing his mother to hospice of [place].  Detailed education provided on hospice's goals and philosophy of care.  We discussed outpatient hospice support versus residential hospice placement.  They are aware that patient currently does not meet residential guidelines as her life expectancy is not considered 2 weeks or less.  I did however discuss given patient's ongoing decline and changes this could change at any time and we will continue to closely monitor for best options.  They would like to continue with radiation  treatment.  I discussed at length patient's current CODE STATUS with consideration to her current illness and comorbidities.  Patient and son are clear and expressing they would not wish for her life to be prolonged and desires for a natural death.  Recommendations for  DNR/DNI provided.  Son verbalized understanding and in full agreement.  Patient also verbalized agreement expressing wishes for no interventions.  Given patient's ongoing weakness, continued decline, concerns for ability to safely allow her to return home alone, and medical needs recommendations for further ER evaluation provided.  Son verbalized understanding and is in agreement.  Manuela Schwartz, CSW also present during visit and provided education to son regarding initiation of long-term care Medicaid and reaching out to the local DSS for further assistance.  [name] verbalized understanding and appreciation.  Understandably son is extremely overwhelmed and emotional regarding his mother's condition and prognosis.  He understands the patient is admitted at [facility] we will continue to offer palliative support.  I discussed the importance of continued conversation with family and their medical providers regarding overall plan of care and treatment options, ensuring decisions are within the context of the patients values and GOCs.  PLAN: Given patient's condition and presentation recommendations for further ER evaluation provided.  Plan for patient to be escorted to [facility] long ED. Detailed discussion with patient and her son [name].  She acknowledges [name] is her primary decision maker and point of contact at this time. DNR/DNI as requested and confirmed by patient and son Updates and education provided on patient's disease trajectory and poor prognosis.  We also approach discussions regarding hospice and goals of care.  They are aware patient does not appropriate for inpatient hospice at this time however the medical team will closely evaluate for any change in status. Recommend ongoing goals of care discussions and close follow-up. I will plan to see patient back in 2-4 weeks in collaboration to other oncology appointments.  If patient is admitted to [facility] we will continue to support and  follow.   Patient expressed understanding and was in agreement with this plan. She also understands that She can call the clinic at any time with any questions, concerns, or complaints.   Time Total: 65 min  Visit consisted of counseling and education dealing with the complex and emotionally intense issues of symptom management and palliative care in the setting of serious and potentially life-threatening illness.Greater than 50%  of this time was spent counseling and coordinating care related to the above assessment and plan.  Signed by: Alda Lea, AGPCNP-BC Palliative Medicine Team/[place]

## 2021-11-02 ENCOUNTER — Ambulatory Visit
Admission: RE | Admit: 2021-11-02 | Discharge: 2021-11-02 | Disposition: A | Discharge status: Home / Self Care | Payer: Medicaid Other | Source: Ambulatory Visit | Attending: Radiation Oncology | Admitting: Radiation Oncology

## 2021-11-02 ENCOUNTER — Encounter: Payer: Self-pay | Admitting: Radiation Oncology

## 2021-11-02 ENCOUNTER — Encounter: Payer: Medicaid Other | Admitting: Nurse Practitioner

## 2021-11-02 DIAGNOSIS — E669 Obesity, unspecified: Secondary | ICD-10-CM | POA: Diagnosis present

## 2021-11-02 DIAGNOSIS — C7989 Secondary malignant neoplasm of other specified sites: Secondary | ICD-10-CM | POA: Diagnosis present

## 2021-11-02 DIAGNOSIS — C799 Secondary malignant neoplasm of unspecified site: Secondary | ICD-10-CM | POA: Diagnosis not present

## 2021-11-02 DIAGNOSIS — R262 Difficulty in walking, not elsewhere classified: Secondary | ICD-10-CM | POA: Diagnosis not present

## 2021-11-02 DIAGNOSIS — D649 Anemia, unspecified: Secondary | ICD-10-CM | POA: Diagnosis present

## 2021-11-02 DIAGNOSIS — Z7189 Other specified counseling: Secondary | ICD-10-CM

## 2021-11-02 DIAGNOSIS — E43 Unspecified severe protein-calorie malnutrition: Secondary | ICD-10-CM | POA: Diagnosis present

## 2021-11-02 DIAGNOSIS — Z515 Encounter for palliative care: Secondary | ICD-10-CM | POA: Diagnosis not present

## 2021-11-02 DIAGNOSIS — Z79899 Other long term (current) drug therapy: Secondary | ICD-10-CM | POA: Diagnosis not present

## 2021-11-02 DIAGNOSIS — Z20822 Contact with and (suspected) exposure to covid-19: Secondary | ICD-10-CM | POA: Diagnosis present

## 2021-11-02 DIAGNOSIS — E1140 Type 2 diabetes mellitus with diabetic neuropathy, unspecified: Secondary | ICD-10-CM | POA: Diagnosis present

## 2021-11-02 DIAGNOSIS — E1122 Type 2 diabetes mellitus with diabetic chronic kidney disease: Secondary | ICD-10-CM | POA: Diagnosis present

## 2021-11-02 DIAGNOSIS — C7801 Secondary malignant neoplasm of right lung: Secondary | ICD-10-CM | POA: Diagnosis present

## 2021-11-02 DIAGNOSIS — I5032 Chronic diastolic (congestive) heart failure: Secondary | ICD-10-CM | POA: Diagnosis present

## 2021-11-02 DIAGNOSIS — Z89511 Acquired absence of right leg below knee: Secondary | ICD-10-CM | POA: Diagnosis not present

## 2021-11-02 DIAGNOSIS — N1832 Chronic kidney disease, stage 3b: Secondary | ICD-10-CM | POA: Diagnosis present

## 2021-11-02 DIAGNOSIS — I130 Hypertensive heart and chronic kidney disease with heart failure and stage 1 through stage 4 chronic kidney disease, or unspecified chronic kidney disease: Secondary | ICD-10-CM | POA: Diagnosis present

## 2021-11-02 DIAGNOSIS — E1151 Type 2 diabetes mellitus with diabetic peripheral angiopathy without gangrene: Secondary | ICD-10-CM | POA: Diagnosis present

## 2021-11-02 DIAGNOSIS — C642 Malignant neoplasm of left kidney, except renal pelvis: Secondary | ICD-10-CM | POA: Diagnosis present

## 2021-11-02 DIAGNOSIS — Z66 Do not resuscitate: Secondary | ICD-10-CM | POA: Diagnosis present

## 2021-11-02 DIAGNOSIS — N390 Urinary tract infection, site not specified: Secondary | ICD-10-CM | POA: Diagnosis present

## 2021-11-02 DIAGNOSIS — C7802 Secondary malignant neoplasm of left lung: Secondary | ICD-10-CM | POA: Diagnosis present

## 2021-11-02 DIAGNOSIS — F17210 Nicotine dependence, cigarettes, uncomplicated: Secondary | ICD-10-CM | POA: Diagnosis present

## 2021-11-02 DIAGNOSIS — C649 Malignant neoplasm of unspecified kidney, except renal pelvis: Secondary | ICD-10-CM | POA: Diagnosis not present

## 2021-11-02 DIAGNOSIS — C787 Secondary malignant neoplasm of liver and intrahepatic bile duct: Secondary | ICD-10-CM | POA: Diagnosis present

## 2021-11-02 DIAGNOSIS — C7889 Secondary malignant neoplasm of other digestive organs: Secondary | ICD-10-CM | POA: Diagnosis present

## 2021-11-02 DIAGNOSIS — C7931 Secondary malignant neoplasm of brain: Secondary | ICD-10-CM | POA: Diagnosis present

## 2021-11-02 DIAGNOSIS — R54 Age-related physical debility: Secondary | ICD-10-CM | POA: Diagnosis present

## 2021-11-02 DIAGNOSIS — E785 Hyperlipidemia, unspecified: Secondary | ICD-10-CM | POA: Diagnosis present

## 2021-11-02 LAB — CBC WITH DIFFERENTIAL/PLATELET
Abs Immature Granulocytes: 0.06 10*3/uL (ref 0.00–0.07)
Basophils Absolute: 0 10*3/uL (ref 0.0–0.1)
Basophils Relative: 0 %
Eosinophils Absolute: 0 10*3/uL (ref 0.0–0.5)
Eosinophils Relative: 0 %
HCT: 26.7 % — ABNORMAL LOW (ref 36.0–46.0)
Hemoglobin: 7.9 g/dL — ABNORMAL LOW (ref 12.0–15.0)
Immature Granulocytes: 1 %
Lymphocytes Relative: 13 %
Lymphs Abs: 0.9 10*3/uL (ref 0.7–4.0)
MCH: 24.5 pg — ABNORMAL LOW (ref 26.0–34.0)
MCHC: 29.6 g/dL — ABNORMAL LOW (ref 30.0–36.0)
MCV: 82.9 fL (ref 80.0–100.0)
Monocytes Absolute: 0.7 10*3/uL (ref 0.1–1.0)
Monocytes Relative: 9 %
Neutro Abs: 5.4 10*3/uL (ref 1.7–7.7)
Neutrophils Relative %: 77 %
Platelets: 317 10*3/uL (ref 150–400)
RBC: 3.22 MIL/uL — ABNORMAL LOW (ref 3.87–5.11)
RDW: 20.7 % — ABNORMAL HIGH (ref 11.5–15.5)
WBC: 7.1 10*3/uL (ref 4.0–10.5)
nRBC: 0 % (ref 0.0–0.2)

## 2021-11-02 LAB — GLUCOSE, CAPILLARY
Glucose-Capillary: 153 mg/dL — ABNORMAL HIGH (ref 70–99)
Glucose-Capillary: 156 mg/dL — ABNORMAL HIGH (ref 70–99)
Glucose-Capillary: 166 mg/dL — ABNORMAL HIGH (ref 70–99)
Glucose-Capillary: 220 mg/dL — ABNORMAL HIGH (ref 70–99)

## 2021-11-02 LAB — CBG MONITORING, ED
Glucose-Capillary: 162 mg/dL — ABNORMAL HIGH (ref 70–99)
Glucose-Capillary: 166 mg/dL — ABNORMAL HIGH (ref 70–99)

## 2021-11-02 LAB — PREALBUMIN: Prealbumin: 13.4 mg/dL — ABNORMAL LOW (ref 18–38)

## 2021-11-02 LAB — MAGNESIUM: Magnesium: 2.2 mg/dL (ref 1.7–2.4)

## 2021-11-02 LAB — PHOSPHORUS: Phosphorus: 3.5 mg/dL (ref 2.5–4.6)

## 2021-11-02 LAB — HEMOGLOBIN A1C
Hgb A1c MFr Bld: 5.8 % — ABNORMAL HIGH (ref 4.8–5.6)
Mean Plasma Glucose: 119.76 mg/dL

## 2021-11-02 LAB — TSH: TSH: 4.518 u[IU]/mL — ABNORMAL HIGH (ref 0.350–4.500)

## 2021-11-02 MED ORDER — OXYCODONE-ACETAMINOPHEN 5-325 MG PO TABS: 1.0000 | ORAL_TABLET | Freq: Four times a day (QID) | ORAL | Status: DC | PRN

## 2021-11-02 MED ORDER — LORAZEPAM 1 MG PO TABS
1.0000 mg | ORAL_TABLET | Freq: Every day | ORAL | Status: DC | PRN
  Administered 2021-11-02: 1 mg via ORAL
  Filled 2021-11-02: qty 1

## 2021-11-02 MED ORDER — HYDROCODONE-ACETAMINOPHEN 5-325 MG PO TABS
1.0000 | ORAL_TABLET | ORAL | Status: DC | PRN
  Filled 2021-11-02: qty 1

## 2021-11-02 MED ORDER — FOLIC ACID 1 MG PO TABS
1.0000 mg | ORAL_TABLET | Freq: Every day | ORAL | Status: DC
  Administered 2021-11-02 – 2021-11-05 (×4): 1 mg via ORAL
  Filled 2021-11-02 (×4): qty 1

## 2021-11-02 MED ORDER — ACETAMINOPHEN 650 MG RE SUPP: 650.0000 mg | Freq: Four times a day (QID) | RECTAL | Status: DC | PRN

## 2021-11-02 MED ORDER — HYDRALAZINE HCL 25 MG PO TABS
25.0000 mg | ORAL_TABLET | Freq: Three times a day (TID) | ORAL | Status: DC
  Administered 2021-11-02 – 2021-11-05 (×8): 25 mg via ORAL
  Filled 2021-11-02 (×9): qty 1

## 2021-11-02 MED ORDER — ACETAMINOPHEN 325 MG PO TABS: 650.0000 mg | ORAL_TABLET | Freq: Four times a day (QID) | ORAL | Status: DC | PRN

## 2021-11-02 MED ORDER — SIMVASTATIN 40 MG PO TABS
40.0000 mg | ORAL_TABLET | Freq: Every day | ORAL | Status: DC
  Administered 2021-11-02 – 2021-11-04 (×3): 40 mg via ORAL
  Filled 2021-11-02 (×3): qty 1

## 2021-11-02 MED ORDER — SODIUM CHLORIDE 0.9 % IV SOLN
1.0000 g | INTRAVENOUS | Status: DC
  Administered 2021-11-02: 1 g via INTRAVENOUS
  Filled 2021-11-02: qty 10

## 2021-11-02 MED ORDER — SODIUM CHLORIDE 0.9 % IV SOLN
75.0000 mL/h | INTRAVENOUS | Status: DC
  Administered 2021-11-02 (×2): 75 mL/h via INTRAVENOUS

## 2021-11-02 MED ORDER — FUROSEMIDE 40 MG PO TABS
40.0000 mg | ORAL_TABLET | Freq: Every day | ORAL | Status: DC
  Administered 2021-11-02 – 2021-11-05 (×4): 40 mg via ORAL
  Filled 2021-11-02 (×4): qty 1

## 2021-11-02 MED ORDER — CARVEDILOL 6.25 MG PO TABS
6.2500 mg | ORAL_TABLET | Freq: Two times a day (BID) | ORAL | Status: DC
  Administered 2021-11-02 – 2021-11-05 (×7): 6.25 mg via ORAL
  Filled 2021-11-02 (×7): qty 1

## 2021-11-02 MED ORDER — FERROUS SULFATE 325 (65 FE) MG PO TABS
325.0000 mg | ORAL_TABLET | Freq: Every day | ORAL | Status: DC
  Administered 2021-11-03 – 2021-11-05 (×3): 325 mg via ORAL
  Filled 2021-11-02 (×3): qty 1

## 2021-11-02 MED ORDER — DEXAMETHASONE 4 MG PO TABS
4.0000 mg | ORAL_TABLET | Freq: Three times a day (TID) | ORAL | Status: DC
  Administered 2021-11-02 – 2021-11-05 (×11): 4 mg via ORAL
  Filled 2021-11-02 (×11): qty 1

## 2021-11-02 MED ORDER — SUCRALFATE 1 GM/10ML PO SUSP
1.0000 g | Freq: Three times a day (TID) | ORAL | Status: DC
  Administered 2021-11-02 – 2021-11-05 (×14): 1 g via ORAL
  Filled 2021-11-02 (×15): qty 10

## 2021-11-02 MED ORDER — PANTOPRAZOLE SODIUM 40 MG PO TBEC
40.0000 mg | DELAYED_RELEASE_TABLET | Freq: Two times a day (BID) | ORAL | Status: DC
  Administered 2021-11-02 – 2021-11-05 (×7): 40 mg via ORAL
  Filled 2021-11-02 (×7): qty 1

## 2021-11-02 NOTE — Evaluation (Signed)
Physical Therapy Evaluation Patient Details Name: Natalie Contreras MRN: [id number] DOB: 02/17/1966 Today's Date: 11/02/2021  History of Present Illness  Natalie Contreras is a 56 y.o. 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 Patient returns to Ed from cancer center with increased  confusion and weakness.  Clinical Impression  Per RN, patient had ativan prior to radiation and now very lethargic. Patient  required total assistance to sit up onto bed edge and back into bed. Patient unable to give clear information about her home situation-where she lives  and who assists her- Information from previous encounters are mixed. Noted palliative  medicine is following for GOC.   Patient clearly will require higher level of  care. Pt admitted with above diagnosis.  Pt currently with functional limitations due to the deficits listed below (see PT Problem List). Pt will benefit from skilled PT to increase their independence and safety with mobility to allow discharge to the venue listed below.   PT will follow depending on [place]      Recommendations for follow up therapy are one component of a multi-disciplinary discharge planning process, led by the attending physician.  Recommendations may be updated based on patient status, additional functional criteria and insurance authorization.  Follow Up Recommendations Skilled nursing-short term rehab (<3 hours/day)    Assistance Recommended at Discharge Frequent or constant Supervision/Assistance  Patient can return home with the following  Two people to help with walking and/or transfers;A lot of help with bathing/dressing/bathroom;Assist for transportation    Equipment Recommendations None recommended  by PT  Recommendations for Other Services       Functional Status Assessment Patient has had a recent decline in their functional status and demonstrates the ability to make significant improvements in function in a reasonable and predictable amount of time.     Precautions / Restrictions        Mobility  Bed Mobility   Bed Mobility: Supine to Sit, Sit to Supine     Supine to sit: Total assist, +2 for physical assistance, +2 for safety/equipment, HOB elevated Sit to supine: Total assist, +2 for physical assistance, +2 for safety/equipment   General bed mobility comments: patient is lethargic and  does not offer any assistance    Transfers                        Ambulation/Gait                  Stairs            Wheelchair Mobility    Modified Rankin (Stroke Patients Only)       Balance Overall balance assessment: Needs assistance Sitting-balance support: Feet unsupported, Bilateral upper extremity supported Sitting balance-Leahy Scale: Zero Sitting balance - Comments: requires constant support to remain upright and balanced when sitting. Postural control: Right lateral lean                                   Pertinent Vitals/Pain Pain Assessment Faces Pain Scale: No hurt    Home Living Family/patient expects to be discharged to:: Private residence Living Arrangements: Children Available Help at Discharge: Family;Available PRN/intermittently Type of Home:  Apartment Home Access: Level entry         Home Equipment: Tub bench;Wheelchair - Publishing copy (2 wheels) Additional Comments: patient reported having roommate at home. daughter helps with groceries and getting to [facility] apt[facility], info from previous  encounter. Has a prosthesis that did not fit last admission per PT note  May have a sliding board.   Prior Function               Mobility Comments: Patient lethargic and  information is  not clear- lives in apt.  ? has a roommate. ADLs Comments: Uses tub transfer bench     Hand Dominance   Dominant Hand: Right    Extremity/Trunk Assessment        Lower Extremity Assessment RLE Deficits / Details: BKA LLE Deficits / Details: has great toe, remainder transmet amp. LLE Sensation: decreased light touch    Cervical / Trunk Assessment Cervical / Trunk Assessment: Kyphotic  Communication   Communication:  (very sleepy and unable to clearly speak-had ativan)  Cognition Arousal/Alertness: Lethargic, Suspect due to medications Behavior During Therapy: Flat affect Overall Cognitive Status: Impaired/Different from baseline Area of Impairment: Orientation, Attention, Awareness                 Orientation Level: Place, Time, Situation Current Attention Level: Focused       Awareness: Intellectual   General Comments: patient is very lethargic and unable to  provide info, follow.        General Comments      Exercises     Assessment/Plan    PT Assessment Patient needs continued PT services  PT Problem List Decreased strength;Decreased range of motion;Decreased activity tolerance;Decreased balance;Decreased mobility;Decreased coordination;Decreased knowledge of use of DME;Cardiopulmonary status limiting activity       PT Treatment Interventions Functional mobility training;Therapeutic activities;Therapeutic exercise;Balance training;Patient/family education;Wheelchair mobility training    PT Goals (Current goals can be found in the Care Plan section)  Acute Rehab PT Goals Patient Stated Goal: none stated PT Goal Formulation: Patient unable to participate in goal setting Time For Goal Achievement: 11/16/21 Potential to Achieve Goals: Fair    Frequency Min 2X/week     Co-evaluation PT/OT/SLP Co-Evaluation/Treatment: Yes Reason for Co-Treatment: For patient/therapist safety PT goals addressed during session: Mobility/safety with mobility OT goals addressed during  session: ADL's and self-care       AM-PAC PT "6 Clicks" Mobility  Outcome Measure Help needed turning from your back to your side while in a flat bed without using bedrails?: Total Help needed moving from lying on your back to sitting on the side of a flat bed without using bedrails?: Total Help needed moving to and from a bed to a chair (including a wheelchair)?: Total Help needed standing up from a chair using your arms (e.g., wheelchair or bedside chair)?: Total Help needed to walk in hospital room?: Total Help needed climbing 3-5 steps with a railing? : Total 6 Click Score: 6    End of Session   Activity Tolerance: Patient limited by lethargy Patient left: in bed;with call bell/phone within reach;with bed alarm set Nurse Communication: Mobility status PT Visit Diagnosis: Muscle weakness (generalized) (M62.81);Adult, failure to thrive (R62.7)    Time: 1610-9604 PT Time Calculation (min) (ACUTE ONLY): 22 min   Charges:   PT Evaluation $PT Eval Low Complexity: [facility] Acute Rehabilitation Services Pager [phone] Office [phone]   Claretha Cooper 11/02/2021, 2:04  PM

## 2021-11-02 NOTE — Plan of Care (Signed)

## 2021-11-02 NOTE — Progress Notes (Signed)
PROGRESS NOTE    Natalie Contreras  WIO:[id number] DOB: July 14, 1966 DOA: 11/01/2021 PCP: Celene Squibb, MD   Brief Narrative:  Natalie Contreras is a 56 y.o. 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  Was seen at any pain yesterday and diagnosed with UTI.  Currently she is undergoing treatment for metastatic renal cancer to multiple areas.  Family requested no aggressive work-up.  They are interested in hospice care. they agreed with comfort measures only.  Assessment & Plan:  UTI: -Patient presented with generalized weakness, no energy.  Afebrile.  UA is concerning for infection. -continue Rocephin, await UC results -No aggressive measures as per family -placed comfort care orders.  Metastatic cancer to brain: Renal cell carcinoma: -CT head shows multiple hyperdense intracranial lesions consistent with hemorrhagic metastasis. -Radiation treatment as per radiation oncology  Chronic diastolic CHF: Patient appears euvolemic on exam. -Gentle hydration.  Strict INO's and daily weight. -Chest x-ray shows pulmonary vascular congestion without pulmonary edema.  Discontinue IV fluids. -Resume home Lasix  CKD stage IIIb: -At baseline.  Continue to monitor.  Avoid nephrotoxic medications.  Type 2 diabetes: -Stable.  A1c 5.8% -Continue sliding scale insulin.  Hold on Jardiance at this time  Hypertension: -Blood pressure is slightly elevated.  Resume Coreg and hydralazine.  Monitor blood pressure closely.  Hyperlipidemia: Continue statin  Status post right BKA: Chronic stable.  Severe protein calorie malnutrition: -Consult dietitian.  Prealbumin is 13.4  Normocytic anemia: H&H is stable.  Continue  to monitor -B12 level: WNL.  Low folic acid.  Low total iron and iron saturation. -Start on ferrous sulfate and folic acid  DVT prophylaxis: SCD Code Status: DNR Family Communication:  None present at bedside.  Plan of care discussed with patient in length and she verbalized understanding and agreed with it.  I called patient's son and discussed plan of care and he agreed with the comfort care measures.  Disposition Plan: To be determined  Consultants:  None  Procedures:  None  Antimicrobials:  Rocephin  Status is: Observation      Subjective: Patient seen and examined.  Resting comfortably on the bed.  She denies any complaints.  No fever, chills, nausea, vomiting, lower abdominal pain.  No acute events overnight.  Objective: Vitals:   11/02/21 0553 11/02/21 0700 11/02/21 1300 11/02/21 1302  BP: (!) 153/50 (!) 146/46  (!) 139/58  Pulse: 75 74  73  Resp: (!) 21 17  18   Temp:    98.4 F (36.9 C)  TempSrc:    Oral  SpO2: 93% 92% (!) 88% 99%    Intake/Output Summary (Last 24 hours) at 11/02/2021 1425 Last data filed at 11/02/2021 1139 Gross per 24 hour  Intake 564.24 ml  Output 550 ml  Net 14.24 ml   There were no vitals filed for this visit.  Examination:  General exam: Appears calm and comfortable, appears dehydrated, on room air Sleepy but arousable and communicating well Respiratory system: Clear to auscultation. Respiratory effort normal.  His has bony prominence on left upper chest. Cardiovascular system: S1 & S2 heard, RRR. No JVD, murmurs, rubs, gallops or clicks. No pedal edema. Gastrointestinal system: Abdomen is nondistended, soft and nontender. No organomegaly or masses felt. Normal bowel sounds heard. Central nervous system: Alert and oriented.  Extremities: Right BKA  skin: No rashes, lesions or ulcers Psychiatry: Judgement and insight appear normal. Mood & affect appropriate.    Data Reviewed: I have personally reviewed following labs and  imaging studies  CBC: Recent Labs  Lab 10/27/21 0439 10/28/21 0714 10/31/21 1620 11/01/21 1557 11/02/21 0553  WBC 5.9 6.7 9.3 7.8 7.1  NEUTROABS  --   --   --  5.7 5.4  HGB 8.0* 8.4* 9.0* 8.8* 7.9*  HCT 27.9* 28.8* 31.5* 30.4* 26.7*  MCV 83.3 82.5 83.6 83.1 82.9  PLT 328 323 385 346 132   Basic Metabolic Panel: Recent Labs  Lab 10/27/21 0439 10/28/21 0443 10/29/21 0422 10/31/21 1620 11/01/21 1557 11/02/21 0553  NA 138 137 137 134* 139  --   K 4.7 5.5* 4.7 4.0 3.9  --   CL 107 108 107 106 108  --   CO2 22 20* 22 18* 23  --   GLUCOSE 259* 208* 206* 187* 170*  --   BUN 63* 67* 72* 69* 69*  --   CREATININE 1.46* 1.43* 1.07* 1.22* 1.18*  --   CALCIUM 8.6* 8.5* 8.8* 8.7* 8.6*  --   MG  --   --   --   --  2.3 2.2  PHOS  --   --   --   --  3.8 3.5   GFR: Estimated Creatinine Clearance: 61 mL/min (A) (by C-G formula based on SCr of 1.18 mg/dL (H)). Liver Function Tests: Recent Labs  Lab 10/31/21 1620 11/01/21 1557  AST 17 15  ALT 31 31  ALKPHOS 160* 139*  BILITOT 0.4 0.4  PROT 6.3* 5.8*  ALBUMIN 3.1* 2.7*   No results for input(s): LIPASE, AMYLASE in the last 168 hours. No results for input(s): AMMONIA in the last 168 hours. Coagulation Profile: No results for input(s): INR, PROTIME in the last 168 hours. Cardiac Enzymes: Recent Labs  Lab 11/01/21 1557  CKTOTAL 12*   BNP (last 3 results) No results for input(s): PROBNP in the last 8760 hours. HbA1C: Recent Labs    11/01/21 2220  HGBA1C 5.8*   CBG: Recent Labs  Lab 11/01/21 2208 11/02/21 0311 11/02/21 0805 11/02/21 0950 11/02/21 1302  GLUCAP 167* 162* 166* 153* 156*   Lipid Profile: No results for input(s): CHOL, HDL, LDLCALC, TRIG, CHOLHDL, LDLDIRECT in the last 72 hours. Thyroid Function Tests: Recent Labs    11/02/21 0550  TSH 4.518*   Anemia Panel: Recent Labs    11/01/21 2220  VITAMINB12 428  FOLATE 4.8*  FERRITIN 356*  TIBC 230*  IRON 16*  RETICCTPCT 2.3   Sepsis Labs: No  results for input(s): PROCALCITON, LATICACIDVEN in the last 168 hours.  Recent Results (from the past 240 hour(s))  Resp Panel by RT-PCR (Flu A&B, Covid) Nasopharyngeal Swab     Status: None   Collection Time: 10/24/21  7:31 AM   Specimen: Nasopharyngeal Swab; Nasopharyngeal(NP) swabs in vial transport medium  Result Value Ref Range Status   SARS Coronavirus 2 by RT PCR NEGATIVE NEGATIVE Final    Comment: (NOTE) SARS-CoV-2 target nucleic acids are NOT DETECTED.  The SARS-CoV-2 RNA is generally detectable in upper respiratory specimens during the acute phase of infection. The lowest concentration of SARS-CoV-2 viral copies this assay can detect is 138 copies/mL. A negative result does not preclude SARS-Cov-2 infection and should not be used as the sole basis for treatment or other patient management decisions. A negative result may occur with  improper specimen collection/handling, submission of  specimen other than nasopharyngeal swab, presence of viral mutation(s) within the areas targeted by this assay, and inadequate number of viral copies(<138 copies/mL). A negative result must be combined with clinical observations, patient history, and epidemiological information. The expected result is Negative.  Fact Sheet for Patients:  EntrepreneurPulse.com.au  Fact Sheet for Healthcare Providers:  IncredibleEmployment.be  This test is no t yet approved or cleared by the [place] FDA and  has been authorized for detection and/or diagnosis of SARS-CoV-2 by FDA under an Emergency Use Authorization (EUA). This EUA will remain  in effect (meaning this test can be used) for the duration of the COVID-19 declaration under Section 564(b)(1) of the Act, 21 U.S.C.section 360bbb-3(b)(1), unless the authorization is terminated  or revoked sooner.       Influenza A by PCR NEGATIVE NEGATIVE Final   Influenza B by PCR NEGATIVE NEGATIVE Final    Comment:  (NOTE) The Xpert Xpress SARS-CoV-2/FLU/RSV plus assay is intended as an aid in the diagnosis of influenza from Nasopharyngeal swab specimens and should not be used as a sole basis for treatment. Nasal washings and aspirates are unacceptable for Xpert Xpress SARS-CoV-2/FLU/RSV testing.  Fact Sheet for Patients: EntrepreneurPulse.com.au  Fact Sheet for Healthcare Providers: IncredibleEmployment.be  This test is not yet approved or cleared by the [place] FDA and has been authorized for detection and/or diagnosis of SARS-CoV-2 by FDA under an Emergency Use Authorization (EUA). This EUA will remain in effect (meaning this test can be used) for the duration of the COVID-19 declaration under Section 564(b)(1) of the Act, 21 U.S.C. section 360bbb-3(b)(1), unless the authorization is terminated or revoked.  Performed at [facility], [address] [place], [place] [address]   Resp Panel by RT-PCR (Flu A&B, Covid) Nasopharyngeal Swab     Status: None   Collection Time: 11/01/21 10:20 PM   Specimen: Nasopharyngeal Swab; Nasopharyngeal(NP) swabs in vial transport medium  Result Value Ref Range Status   SARS Coronavirus 2 by RT PCR NEGATIVE NEGATIVE Final    Comment: (NOTE) SARS-CoV-2 target nucleic acids are NOT DETECTED.  The SARS-CoV-2 RNA is generally detectable in upper respiratory specimens during the acute phase of infection. The lowest concentration of SARS-CoV-2 viral copies this assay can detect is 138 copies/mL. A negative result does not preclude SARS-Cov-2 infection and should not be used as the sole basis for treatment or other patient management decisions. A negative result may occur with  improper specimen collection/handling, submission of specimen other than nasopharyngeal swab, presence of viral mutation(s) within the areas targeted by this assay, and inadequate number of viral copies(<138 copies/mL). A  negative result must be combined with clinical observations, patient history, and epidemiological information. The expected result is Negative.  Fact Sheet for Patients:  EntrepreneurPulse.com.au  Fact Sheet for Healthcare Providers:  IncredibleEmployment.be  This test is no t yet approved or cleared by the [place] FDA and  has been authorized for detection and/or diagnosis of SARS-CoV-2 by FDA under an Emergency Use Authorization (EUA). This EUA will remain  in effect (meaning this test can be used) for the duration of the COVID-19 declaration under Section 564(b)(1) of the Act, 21 U.S.C.section 360bbb-3(b)(1), unless the authorization is terminated  or revoked sooner.       Influenza A by PCR NEGATIVE NEGATIVE Final   Influenza B by PCR NEGATIVE NEGATIVE Final    Comment: (NOTE) The Xpert Xpress SARS-CoV-2/FLU/RSV plus assay is intended as an aid in the diagnosis of influenza from Nasopharyngeal  swab specimens and should not be used as a sole basis for treatment. Nasal washings and aspirates are unacceptable for Xpert Xpress SARS-CoV-2/FLU/RSV testing.  Fact Sheet for Patients: EntrepreneurPulse.com.au  Fact Sheet for Healthcare Providers: IncredibleEmployment.be  This test is not yet approved or cleared by the [place] FDA and has been authorized for detection and/or diagnosis of SARS-CoV-2 by FDA under an Emergency Use Authorization (EUA). This EUA will remain in effect (meaning this test can be used) for the duration of the COVID-19 declaration under Section 564(b)(1) of the Act, 21 U.S.C. section 360bbb-3(b)(1), unless the authorization is terminated or revoked.  Performed at [facility], [place] [address], [place], [place] [address]       Radiology Studies: CT HEAD WO CONTRAST (5MM)  Result Date: 11/01/2021 CLINICAL DATA:  Altered level of consciousness,  increasing confusion weakness, history of metastatic cancer EXAM: CT HEAD WITHOUT CONTRAST TECHNIQUE: Contiguous axial images were obtained from the base of the skull through the vertex without intravenous contrast. RADIATION DOSE REDUCTION: This exam was performed according to the departmental dose-optimization program which includes automated exposure control, adjustment of the mA and/or kV according to patient size and/or use of iterative reconstruction technique. COMPARISON:  10/24/2021, 10/19/2021 FINDINGS: Brain: Multiple intracranial masses are again identified, consistent with metastatic disease. Largest heterogeneously hyperdense mass within the left parietal lobe measures 3.3 x 3.1 cm reference image 18/2, with marked surrounding vasogenic edema, stable. There are and least 5 other hyperdense intracranial masses again identified. The sub ependymal lesion in the right lateral ventricle now measures up to 1.1 x 0.6 cm, previously subcentimeter in size. Left frontal hyperdense lesion measures 1.0 x 0.8 cm in size, previously 0.8 x 0.6 cm. I do not see any new metastatic lesions since the prior exam. Again, hyperdensity in these lesions suggest hemorrhagic component. No evidence of acute infarct. The lateral ventricles and remaining midline structures are stable. Chronic encephalomalacia within the cerebellum. No acute extra-axial fluid collections. No significant mass effect or midline shift. Vascular: No hyperdense vessel or unexpected calcification. Skull: Normal. Negative for fracture or focal lesion. Sinuses/Orbits: No acute finding. Other: None. IMPRESSION: 1. Multiple hyperdense intracranial lesions, consistent with hemorrhagic metastases. The dominant lesion in the left parietal lobe is grossly stable, with minimal increase in size of smaller index lesions as described above. 2. No significant mass effect or midline shift. 3. No new lesions since previous exam. 4. No evidence of acute infarct.  Electronically Signed   By: Randa Ngo M.D.   On: 11/01/2021 16:32   DG Chest Portable 1 View  Result Date: 11/01/2021 CLINICAL DATA:  Increased confusion and weakness. EXAM: PORTABLE CHEST 1 VIEW COMPARISON:  10/05/2021 FINDINGS: Bilateral pulmonary nodules again noted, similar to prior chest x-ray better characterized on CT chest 10/05/2021. The cardio pericardial silhouette is enlarged. There is pulmonary vascular congestion without overt pulmonary edema. No evidence for pneumothorax or pleural effusion. Telemetry leads overlie the chest. IMPRESSION: 1. Pulmonary vascular congestion without overt pulmonary edema. 2. Stable bilateral pulmonary nodules. Electronically Signed   By: Misty Stanley M.D.   On: 11/01/2021 16:39    Scheduled Meds:  dexamethasone  4 mg Oral TID   insulin aspart  0-9 Units Subcutaneous Q4H   levETIRAcetam  500 mg Oral BID   pantoprazole  40 mg Oral BID   simvastatin  40 mg Oral QHS   sucralfate  1 g Oral TID WC & HS   Continuous Infusions:  sodium chloride Stopped (11/02/21 1127)  cefTRIAXone (ROCEPHIN)  IV       LOS: 0 days   Time spent: 35 minutes   Fabrizzio Marcella Loann Quill, MD [facility]  If 7PM-7AM, please contact night-coverage [email or web address] 11/02/2021, 2:25 PM

## 2021-11-02 NOTE — Progress Notes (Signed)
Occupational Therapy Evaluation  Patient currently quite lethargic, back from radiation treatment and RN reports patient gets ativan prior to treatments. Patient providing inconsistent PLOF information with home information very conflicting in chart from recent admissions. Unsure exactly how much assistance she needs at home with self care or transfers. Currently patient is total A +2 for bed mobility with posterior lean and unable to correct with multimodal cues. Patient refusing to eat but did allow OT to hold cup while she drank some ice tea. Recommend rehab at D/C pending patient progress, acute OT to follow.    11/02/21 1401  OT Visit Information  Last OT Received On 11/02/21  Assistance Needed +2  PT/OT/SLP Co-Evaluation/Treatment Yes  Reason for Co-Treatment To address functional/ADL transfers;For patient/therapist safety  PT goals addressed during session Mobility/safety with mobility  OT goals addressed during session ADL's and self-care  History of Present Illness Natalie Contreras is a 56 y.o. 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 Patient returns to Ed from cancer center with increased  confusion and weakness.  Precautions  Precautions Fall  Precaution Comments R BKA with prosthesis  Restrictions  Weight Bearing Restrictions No  Home Living  Family/patient expects to be discharged to: Private residence  Living Arrangements Children  Available Help at Discharge Family;Available PRN/intermittently  Type of Home Apartment  Home Access Level entry  Entrance Stairs-Number of Steps 11  Entrance Stairs-Rails Right  Home Layout One level  Bathroom Therapist, music Tub  bench;Wheelchair - Publishing copy (2 wheels)  Additional Comments patient reported having roommate at home. daughter helps with groceries and getting to [facility] apts, info from previous  encounter. Has a prosthesis that did not fit last admission per PT note  Prior Function  Mobility Comments Patient lethargic and information is not clear- lives in apt. ? has a roommate.  ADLs Comments Patient is very lethargic and providing conflicting information. Initially stating her daughter helps her with all ADLs, then states she only comes around intermittently. When asked what she does for the bathroom states goes in her pants doesn't wear briefs  Communication  Communication No difficulties  Pain Assessment  Pain Assessment No/denies pain  Cognition  Arousal/Alertness Lethargic;Suspect due to medications  Behavior During Therapy Flat affect  Overall Cognitive Status Impaired/Different from baseline  Area of Impairment Orientation;Attention;Awareness  Orientation Level Disoriented to;Place;Time;Situation  Current Attention Level Focused  Awareness Intellectual  General Comments Patient is very lethargic and unable to provide consistent PLOF info. Thinks she is at the Cancer center, month is December. Was oriented to year  Upper Extremity Assessment  Upper Extremity Assessment Difficult to assess due to impaired cognition  Lower Extremity Assessment  Lower Extremity Assessment Defer to PT evaluation  ADL  Overall ADL's  Needs assistance/impaired  Eating/Feeding Total assistance;Sitting  Eating/Feeding Details (indicate cue type and reason) Patient is unable to hold onto her cup safely without spilling likely due to lethargy  Grooming Total assistance;Sitting;Bed level  Upper Body Bathing Total assistance  Lower Body Bathing Total assistance  Upper Body Dressing  Total assistance  Lower Body Dressing Total assistance  Toilet Transfer Details (indicate cue type and reason) deferred  Toileting-  Clothing Manipulation and Hygiene Total assistance  General ADL Comments  Patient is very lethargic and needing total A for self cares at this time. May be able to perform more independently with increased alertness  Bed Mobility  Overal bed mobility Needs Assistance  Bed Mobility Supine to Sit;Sit to Supine  Supine to sit Total assist;+2 for physical assistance;+2 for safety/equipment;HOB elevated  Sit to supine Total assist;+2 for physical assistance;+2 for safety/equipment  General bed mobility comments patient is lethargic and  does not offer any assistance  Transfers  General transfer comment NT  Balance  Overall balance assessment Needs assistance  Sitting-balance support Feet unsupported;Bilateral upper extremity supported  Sitting balance-Leahy Scale Zero  Sitting balance - Comments requires constant support to remain upright and balanced when sitting.  OT - End of Session  Activity Tolerance Patient limited by lethargy  Patient left in bed;with call bell/phone within reach;with bed alarm set  Nurse Communication Mobility status  OT Assessment  OT Recommendation/Assessment Patient needs continued OT Services  OT Visit Diagnosis Other abnormalities of gait and mobility (R26.89);Muscle weakness (generalized) (M62.81);Other symptoms and signs involving cognitive function  OT Problem List Decreased strength;Decreased activity tolerance;Impaired balance (sitting and/or standing);Decreased cognition;Decreased safety awareness;Obesity  OT Plan  OT Frequency (ACUTE ONLY) Min 2X/week  OT Treatment/Interventions (ACUTE ONLY) Self-care/ADL training;Therapeutic exercise;Neuromuscular education;DME and/or AE instruction;Therapeutic activities;Patient/family education;Balance training  AM-PAC OT "6 Clicks" Daily Activity Outcome Measure (Version 2)  Help from another person eating meals? 1  Help from another person taking care of personal grooming? 1  Help from another person toileting, which  includes using toliet, bedpan, or urinal? 1  Help from another person bathing (including washing, rinsing, drying)? 1  Help from another person to put on and taking off regular upper body clothing? 1  Help from another person to put on and taking off regular lower body clothing? 1  6 Click Score 6  Progressive Mobility  What is the highest level of mobility based on the progressive mobility assessment? Level 1 (Bedfast) - Unable to balance while sitting on edge of bed  Activity Dangled on edge of bed  OT Recommendation  Follow Up Recommendations Skilled nursing-short term rehab (<3 hours/day)  Assistance recommended at discharge Frequent or constant Supervision/Assistance  Patient can return home with the following Two people to help with walking and/or transfers;Two people to help with bathing/dressing/bathroom;Assistance with cooking/housework;Assistance with feeding;Direct supervision/assist for medications management;Direct supervision/assist for financial management;Assist for transportation;Help with stairs or ramp for entrance  Functional Status Assessent Patient has had a recent decline in their functional status and/or demonstrates limited ability to make significant improvements in function in a reasonable and predictable amount of time  OT Equipment None recommended by OT  Individuals Consulted  Consulted and Agree with Results and Recommendations Patient  Acute Rehab OT Goals  Patient Stated Goal unable  OT Goal Formulation Patient unable to participate in goal setting  Time For Goal Achievement 11/16/21  Potential to [place]  OT Time Calculation  OT Start Time (ACUTE ONLY) 1313  OT Stop Time (ACUTE ONLY) 1332  OT Time Calculation (min) 19 min  OT General Charges  $OT Visit 1 Visit  OT Evaluation  $OT Eval Low Complexity 1 Low  Written Expression  Dominant Hand Right   Delbert Phenix OT OT pager: [phone]

## 2021-11-02 NOTE — Consult Note (Addendum)
Palliative Care Consult Note                                  Date: 11/02/2021   Patient Name: Natalie Contreras  DOB: May 19, 1966  MRN: [id number]  Age / Sex: 56 y.o., female  PCP: Celene Squibb, MD Referring Physician: Mckinley Jewel, MD  Reason for Consultation: Establishing goals of care  HPI/Patient Profile: Palliative Care consult requested for goals of care discussion in this 56 y.o. female  with past medical history of  metastatic renal cell carcinoma with mets to liver, diffuse chest and abdominal wall soft tissue metastasis, brain metastasis, and bilateral lung involvement.  Patient was recently hospitalized and discharged home.. TOC initially attempted to arrange for placement however with no bed offers or home health availability patient was discharged home expressing ability to provide care for herself.  She was admitted on 11/01/2021 after being seen at [facility] for scheduled radiation treatment with generalized weakness and UTI.   Past Medical History:  Diagnosis Date   Abscess of foot 08/09/2019   WITH ULCER  RIGHT FOOT   Acute diastolic CHF (congestive heart failure) ([place]) 10/28/2016   Brain cancer ([place])    Cancer of kidney ([place])    Carotid stenosis, asymptomatic, bilateral 01/15/2017   Diabetes mellitus without complication (HCC)    Heart murmur    Hypertension    PAD (peripheral artery disease) ([place]) 04/10/2017   Left ABI 0.74. Right ABI 0.96.   Stomach cancer ([place])      Subjective:   This NP Osborne Oman reviewed medical records, received report from team, assessed the patient and then spoke with son Natalie Contreras via phone to discuss diagnosis, prognosis, [place], EOL wishes disposition and options.  I am familiar with Caffey and saw her yesterday at the [facility] for palliative needs.    [name] is somnolent. Complains of some generalized pain and weakness. Is able to answer some questions appropriately. Falls  asleep during evaluation. Her lunch tray is at the bedside uneaten. It appears she has drank some of her tea. She states she is not hungry and has no appetite.   Updates provided to her son, Natalie Contreras. He is tearful expressing concerns of his mother's what seems to be rapid decline.   I Created space and opportunity for son to explore state of health prior to admission, thoughts, and feelings.   We discussed Her current illness and what it means in the larger context of Her on-going co-morbidities. Natural disease trajectory and expectations were discussed.  Natalie Contreras verbalized understanding of current illness and co-morbidities. When discussing conditions with [name] she expresses she is tired and does not wish to suffer. States she should have never expressed wishes to come home knowing she would be alone and unable to care for herself. She acknowledges declining health.   Natalie Contreras expresses he has been in conversation with his family and they are in mutual agreement to continue to treat the treatable with no escalation of care. They are leaning towards focusing on patient's comfort and what time she has left.   I had open and honest discussion with son regarding Angle's poor long-term prognosis. She is not likely to show any meaningful recovery and most likely will continue to decline. He verbalizes understanding again confirming family would not wish to prolong her life or for any aggressive interventions.   I discussed the importance of  continued conversation with family and their medical providers regarding overall plan of care and treatment options, ensuring decisions are within the context of the patients values and GOCs.  Questions and concerns were addressed.  The family was encouraged to call with questions or concerns.  PMT will continue to support holistically as needed.  Objective:   Primary Diagnoses: Present on Admission:  UTI (urinary tract infection)  Benign essential HTN   Hyperlipidemia  Obesity (BMI 30-39.9)  Ambulatory dysfunction  Metastatic cancer to brain ([place])  Severe protein-calorie malnutrition (HCC)  Chronic diastolic CHF (congestive heart failure) (HCC)  Anemia  Renal cell carcinoma (HCC)   Scheduled Meds:  carvedilol  6.25 mg Oral BID WC   dexamethasone  4 mg Oral TID   [START ON 11/03/2021] ferrous sulfate  325 mg Oral Q breakfast   folic acid  1 mg Oral Daily   furosemide  40 mg Oral Daily   hydrALAZINE  25 mg Oral Q8H   insulin aspart  0-9 Units Subcutaneous Q4H   levETIRAcetam  500 mg Oral BID   pantoprazole  40 mg Oral BID   simvastatin  40 mg Oral QHS   sucralfate  1 g Oral TID WC & HS    Continuous Infusions:  cefTRIAXone (ROCEPHIN)  IV      PRN Meds: acetaminophen **OR** acetaminophen, HYDROcodone-acetaminophen, LORazepam, oxyCODONE-acetaminophen  No Known Allergies  Review of Systems  Constitutional:  Positive for appetite change.  Musculoskeletal:  Positive for arthralgias.  Neurological:  Positive for weakness.  Unless otherwise noted, a complete review of systems is negative.  Physical Exam General: Somnolent, frail chronically-ill appearing Cardiovascular: regular rate and rhythm Pulmonary: diminished bilaterally  Abdomen: soft, nontender, + bowel sounds Extremities: left extremity edema, right BKA Skin: no rashes, warm and dry Neurological: somnolent, alert to person and year. Answers some questions appropriately.   Vital Signs:  BP (!) 140/54 (BP Location: Right Arm)    Pulse 71    Temp 98.4 F (36.9 C) (Oral)    Resp 17    LMP 12/16/2015    SpO2 100%  Pain Scale: 0-10   Pain Score: 0-No pain  SpO2: SpO2: 100 % O2 Device:SpO2: 100 % O2 Flow Rate: .O2 Flow Rate (L/min): 2 L/min  IO: Intake/output summary:  Intake/Output Summary (Last 24 hours) at 11/02/2021 1627 Last data filed at 11/02/2021 1522 Gross per 24 hour  Intake 727.92 ml  Output 550 ml  Net 177.92 ml    LBM: Last BM Date : 11/02/21  (smear) Baseline Weight:   Most recent weight:        Palliative Assessment/Data: PPS 20-30%   Advanced Care Planning:   Primary Decision Maker: NEXT OF KIN- Son Durenda Hurt  Code Status/Advance Care Planning: DNR  Son confirms wishes for DNR/DNI. Patient also expressed DNR as her wishes. She would desire a natural death with no life-sustaining measures.   Hospice services outpatient were explained and offered. Patient and family verbalized their understanding and awareness of hospice's goals and philosophy of care. Son expresses family is leaning towards a more hospice approach. If patient continues to decline, shows no improvement, and appetite remains poor she would meet criteria for residential hospice.   Assessment & Plan:   SUMMARY OF RECOMMENDATIONS   DNR/DNI-as confirmed by patient and family Continue with current plan of care , treat the treatable, no escalation of care. Family and patient have been clear in expressed wishes for no aggressive interventions. Son is having ongoing family discussions. Leaning  towards residential hospice pending outcome.  PMT will continue to support and follow as needed. Please call team line with urgent needs. I will be off service returning on Monday. Family is aware.   Symptom Management:  Per Attending  Palliative Prophylaxis:  Aspiration, Bowel Regimen, Delirium Protocol, Frequent Pain Assessment, Oral Care, and Turn Reposition  Additional Recommendations (Limitations, Scope, Preferences): No Artificial Feeding and treat the treatable, no escalation of care   Psycho-social/Spiritual:  Desire for further Chaplaincy support: no Additional Recommendations: Education on Hospice  Prognosis:  Poor  Discharge Planning:  To Be Determined    Patient's son expressed understanding and was in agreement with this plan.   Time Total: 50 min.   Visit consisted of counseling and education dealing with the complex and emotionally  intense issues of symptom management and palliative care in the setting of serious and potentially life-threatening illness.Greater than 50%  of this time was spent counseling and coordinating care related to the above assessment and plan.  Signed by:  Alda Lea, AGPCNP-BC Palliative Medicine Team  Phone: [phone] Pager: [phone] Amion: Bjorn Pippin   Thank you for allowing the Palliative Medicine Team to assist in the care of this patient. Please utilize secure chat with additional questions, if there is no response within 30 minutes please call the above phone number. Palliative Medicine Team providers are available by phone from 7am to 5pm daily and can be reached through the team cell phone.  Should this patient require assistance outside of these hours, please call the patient's attending physician.

## 2021-11-02 NOTE — Progress Notes (Signed)
Initial Nutrition Assessment  DOCUMENTATION CODES:   Obesity unspecified  INTERVENTION:   -Recommend liberalize diet to regular now comfort care   NUTRITION DIAGNOSIS:   Increased nutrient needs related to cancer and cancer related treatments as evidenced by estimated needs.  GOAL:   Patient will meet greater than or equal to 90% of their needs  MONITOR:   PO intake  REASON FOR ASSESSMENT:   Consult Assessment of nutrition requirement/status  ASSESSMENT:   56 y.o. female with medical history significant of  renal metastatic cancer getting brain radiation.  Patient sleeping in room, no family present at bedside. Lunch tray sitting untouched. Pt was recently given ativan. Still underwent radiation.  Per chart review now, pt is now comfort care.  Recommend diet liberalization if comfort measures.  Per weight records, pt's weight has increased since January 2023.   Medications: folic acid, ferrous sulfate, Lasix, Carafate  Labs reviewed:  CBGs: 153-166  NUTRITION - FOCUSED PHYSICAL EXAM:  Flowsheet Row Most Recent Value  Orbital Region Mild depletion  Upper Arm Region No depletion  Thoracic and Lumbar Region No depletion  Buccal Region No depletion  Temple Region Mild depletion  Clavicle Bone Region No depletion  Clavicle and Acromion Bone Region No depletion  Scapular Bone Region Mild depletion  Dorsal Hand Unable to assess  Patellar Region Unable to assess  Anterior Thigh Region Unable to assess  Posterior Calf Region Unable to assess  Edema (RD Assessment) None  Hair Reviewed  Eyes Unable to assess  [kept eyes closed]  Mouth Reviewed  [missing teeth]  Skin Reviewed       Diet Order:   Diet Order             Diet Carb Modified Fluid consistency: Thin; Room service appropriate? Yes  Diet effective now                   EDUCATION NEEDS:   No education needs have been identified at this time  Skin:  Skin Assessment: Reviewed RN  Assessment  Last BM:  2/17  Height:   Ht Readings from Last 1 Encounters:  10/31/21 5\' 4"  (1.626 m)    Weight:   Wt Readings from Last 1 Encounters:  10/31/21 95.1 kg    BMI:  37.8 kg/m^2 -adjusted for rt BKA  Estimated Nutritional Needs:   Kcal:  1700-1900  Protein:  85-100g  Fluid:  1.9L/day   Clayton Bibles, MS, RD, LDN Inpatient Clinical Dietitian Contact information available via Amion

## 2021-11-03 LAB — URINE CULTURE: Culture: NO GROWTH

## 2021-11-03 LAB — GLUCOSE, CAPILLARY
Glucose-Capillary: 194 mg/dL — ABNORMAL HIGH (ref 70–99)
Glucose-Capillary: 195 mg/dL — ABNORMAL HIGH (ref 70–99)
Glucose-Capillary: 203 mg/dL — ABNORMAL HIGH (ref 70–99)
Glucose-Capillary: 251 mg/dL — ABNORMAL HIGH (ref 70–99)
Glucose-Capillary: 293 mg/dL — ABNORMAL HIGH (ref 70–99)
Glucose-Capillary: 307 mg/dL — ABNORMAL HIGH (ref 70–99)
Glucose-Capillary: 333 mg/dL — ABNORMAL HIGH (ref 70–99)

## 2021-11-03 NOTE — Plan of Care (Signed)
  Problem: Education: Goal: Knowledge of General Education information will improve Description: Including pain rating scale, medication(s)/side effects and non-pharmacologic comfort measures Outcome: Progressing   Problem: Coping: Goal: Level of anxiety will decrease Outcome: Progressing   Problem: Pain Managment: Goal: General experience of comfort will improve Outcome: Progressing   

## 2021-11-03 NOTE — Progress Notes (Signed)
PROGRESS NOTE    Natalie Contreras  BSW:[id number] DOB: [birth year] DOA: 11/01/2021 PCP: Celene Squibb, MD   Brief Narrative:  Natalie Contreras is a 56 y.o. 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  Was seen at any pain yesterday and diagnosed with UTI.  Currently she is undergoing treatment for metastatic renal cancer to multiple areas.  Family requested no aggressive work-up.  They are interested in hospice care. they agreed with comfort measures only.  Assessment & Plan:  UTI: -Patient presented with generalized weakness, no energy.  Afebrile.  UA i was concerning for infection-patient started on Rocephin.   -Urine culture shows no growth.  Discontinued IV antibiotics -No aggressive measures as per family -Patient has poor prognosis.  Discussed with patient's son-agreed with comfort measures and residential hospice.  He is aware that patient will not get further radiations. -Appreciate palliative care consult.  Metastatic cancer to brain: Renal cell carcinoma: -CT head shows multiple hyperdense intracranial lesions consistent with hemorrhagic metastasis. -Radiation treatment as per radiation oncology -Continue Decadron -Continue Keppra  Chronic diastolic CHF: Patient appears euvolemic on exam. -Strict INO's and daily weight. -Chest x-ray shows pulmonary vascular congestion without pulmonary edema. -Continue home Lasix  CKD stage IIIb: -At baseline.  Continue to monitor.  Avoid nephrotoxic medications.  Type 2 diabetes: -Stable.  A1c 5.8% -Continue sliding scale insulin.  Hold on Jardiance at this time  Hypertension: -Blood pressure is slightly elevated.  -Continue Coreg and  hydralazine  Hyperlipidemia: Continue statin  Status post right BKA: Chronic stable.  Severe protein calorie malnutrition: -Consult dietitian-appreciate help.  Prealbumin is 13.4  Normocytic anemia: H&H is stable.   -B12 level: WNL.  Low folic acid.  Low total iron and iron saturation. -Continue ferrous sulfate and folic acid  DVT prophylaxis: SCD Code Status: DNR Family Communication:  None present at bedside.  Plan of care discussed with patient in length and she verbalized understanding and agreed with it.  I called patient's son and discussed plan of care and he agreed with the comfort care measures.  Disposition Plan: Hospice  Consultants:  Negative care  Procedures:  None  Antimicrobials:  Rocephin  Status is: Inpatient      Subjective: Patient seen and examined.  Lying comfortably on the bed.  Denies any complaints.  Remained afebrile.  No acute events overnight.  Objective: Vitals:   11/02/21 1722 11/02/21 1726 11/02/21 2013 11/03/21 0541  BP: (!) 117/59 (!) 117/59 (!) 128/49 (!) 155/60  Pulse: 71 71 70   Resp: 15 15 18    Temp: 98.4 F (36.9 C) 98.4 F (36.9 C) 97.8 F (36.6 C)   TempSrc: Oral Axillary Oral   SpO2: 98%  99%   Weight:  95.1 kg    Height:  5\' 4"  (1.626 m)      Intake/Output Summary (Last 24 hours) at 11/03/2021 1238 Last data filed at 11/03/2021 1000 Gross per 24 hour  Intake 460.16 ml  Output 700 ml  Net -239.84 ml    Filed Weights   11/02/21 1726  Weight: 95.1 kg    Examination:  General exam: Appears calm and comfortable, appears sick, on room air, communicating well Respiratory system: Clear to auscultation. Respiratory effort normal.  His has bony prominence on left upper chest. Cardiovascular  system: S1 & S2 heard, RRR. No JVD, murmurs, rubs, gallops or clicks. No pedal edema. Gastrointestinal system: Abdomen is nondistended, soft and nontender. No organomegaly or masses felt. Normal bowel sounds heard. Central  nervous system: Alert and oriented.  Extremities: Right BKA  skin: No rashes, lesions or ulcers Psychiatry: Judgement and insight appear normal. Mood & affect appropriate.    Data Reviewed: I have personally reviewed following labs and imaging studies  CBC: Recent Labs  Lab 10/28/21 0714 10/31/21 1620 11/01/21 1557 11/02/21 0553  WBC 6.7 9.3 7.8 7.1  NEUTROABS  --   --  5.7 5.4  HGB 8.4* 9.0* 8.8* 7.9*  HCT 28.8* 31.5* 30.4* 26.7*  MCV 82.5 83.6 83.1 82.9  PLT 323 385 346 973    Basic Metabolic Panel: Recent Labs  Lab 10/28/21 0443 10/29/21 0422 10/31/21 1620 11/01/21 1557 11/02/21 0553  NA 137 137 134* 139  --   K 5.5* 4.7 4.0 3.9  --   CL 108 107 106 108  --   CO2 20* 22 18* 23  --   GLUCOSE 208* 206* 187* 170*  --   BUN 67* 72* 69* 69*  --   CREATININE 1.43* 1.07* 1.22* 1.18*  --   CALCIUM 8.5* 8.8* 8.7* 8.6*  --   MG  --   --   --  2.3 2.2  PHOS  --   --   --  3.8 3.5    GFR: Estimated Creatinine Clearance: 61 mL/min (A) (by C-G formula based on SCr of 1.18 mg/dL (H)). Liver Function Tests: Recent Labs  Lab 10/31/21 1620 11/01/21 1557  AST 17 15  ALT 31 31  ALKPHOS 160* 139*  BILITOT 0.4 0.4  PROT 6.3* 5.8*  ALBUMIN 3.1* 2.7*    No results for input(s): LIPASE, AMYLASE in the last 168 hours. No results for input(s): AMMONIA in the last 168 hours. Coagulation Profile: No results for input(s): INR, PROTIME in the last 168 hours. Cardiac Enzymes: Recent Labs  Lab 11/01/21 1557  CKTOTAL 12*    BNP (last 3 results) No results for input(s): PROBNP in the last 8760 hours. HbA1C: Recent Labs    11/01/21 2220  HGBA1C 5.8*    CBG: Recent Labs  Lab 11/02/21 2016 11/03/21 0000 11/03/21 0401 11/03/21 0801 11/03/21 1137  GLUCAP 220* 195* 194* 203* 333*    Lipid Profile: No results for input(s): CHOL, HDL, LDLCALC, TRIG, CHOLHDL, LDLDIRECT in the last 72 hours. Thyroid Function Tests: Recent Labs    11/02/21 0550  TSH 4.518*     Anemia Panel: Recent Labs    11/01/21 2220  VITAMINB12 428  FOLATE 4.8*  FERRITIN 356*  TIBC 230*  IRON 16*  RETICCTPCT 2.3    Sepsis Labs: No results for input(s): PROCALCITON, LATICACIDVEN in the last 168 hours.  Recent Results (from the past 240 hour(s))  Urine Culture     Status: None   Collection Time: 11/01/21 10:20 PM   Specimen: Urine, Clean Catch  Result Value Ref Range Status   Specimen Description   Final    URINE, CLEAN CATCH Performed at [facility], [place] [address], [place], [place] [address]    Special Requests   Final    NONE Performed at [facility], [place] [address], [place], [place] [address]    Culture   Final    NO GROWTH Performed at [facility], [place] [address], [place], [place] [address]    Report Status  11/03/2021 FINAL  Final  Resp Panel by RT-PCR (Flu A&B, Covid) Nasopharyngeal Swab     Status: None   Collection Time: 11/01/21 10:20 PM   Specimen: Nasopharyngeal Swab; Nasopharyngeal(NP) swabs in vial transport medium  Result Value Ref Range Status   SARS Coronavirus 2 by RT PCR NEGATIVE NEGATIVE Final    Comment: (NOTE) SARS-CoV-2 target nucleic acids are NOT DETECTED.  The SARS-CoV-2 RNA is generally detectable in upper respiratory specimens during the acute phase of infection. The lowest concentration of SARS-CoV-2 viral copies this assay can detect is 138 copies/mL. A negative result does not preclude SARS-Cov-2 infection and should not be used as the sole basis for treatment or other patient management decisions. A negative result may occur with  improper specimen collection/handling, submission of specimen other than nasopharyngeal swab, presence of viral mutation(s) within the areas targeted by this assay, and inadequate number of viral copies(<138 copies/mL). A negative result must be combined with clinical observations, patient history, and epidemiological information. The expected  result is Negative.  Fact Sheet for Patients:  EntrepreneurPulse.com.au  Fact Sheet for Healthcare Providers:  IncredibleEmployment.be  This test is no t yet approved or cleared by the [place] FDA and  has been authorized for detection and/or diagnosis of SARS-CoV-2 by FDA under an Emergency Use Authorization (EUA). This EUA will remain  in effect (meaning this test can be used) for the duration of the COVID-19 declaration under Section 564(b)(1) of the Act, 21 U.S.C.section 360bbb-3(b)(1), unless the authorization is terminated  or revoked sooner.       Influenza A by PCR NEGATIVE NEGATIVE Final   Influenza B by PCR NEGATIVE NEGATIVE Final    Comment: (NOTE) The Xpert Xpress SARS-CoV-2/FLU/RSV plus assay is intended as an aid in the diagnosis of influenza from Nasopharyngeal swab specimens and should not be used as a sole basis for treatment. Nasal washings and aspirates are unacceptable for Xpert Xpress SARS-CoV-2/FLU/RSV testing.  Fact Sheet for Patients: EntrepreneurPulse.com.au  Fact Sheet for Healthcare Providers: IncredibleEmployment.be  This test is not yet approved or cleared by the [place] FDA and has been authorized for detection and/or diagnosis of SARS-CoV-2 by FDA under an Emergency Use Authorization (EUA). This EUA will remain in effect (meaning this test can be used) for the duration of the COVID-19 declaration under Section 564(b)(1) of the Act, 21 U.S.C. section 360bbb-3(b)(1), unless the authorization is terminated or revoked.  Performed at [facility], [place] [address], [place], [place] [address]        Radiology Studies: CT HEAD WO CONTRAST (5MM)  Result Date: 11/01/2021 CLINICAL DATA:  Altered level of consciousness, increasing confusion weakness, history of metastatic cancer EXAM: CT HEAD WITHOUT CONTRAST TECHNIQUE: Contiguous axial images were  obtained from the base of the skull through the vertex without intravenous contrast. RADIATION DOSE REDUCTION: This exam was performed according to the departmental dose-optimization program which includes automated exposure control, adjustment of the mA and/or kV according to patient size and/or use of iterative reconstruction technique. COMPARISON:  10/24/2021, 10/19/2021 FINDINGS: Brain: Multiple intracranial masses are again identified, consistent with metastatic disease. Largest heterogeneously hyperdense mass within the left parietal lobe measures 3.3 x 3.1 cm reference image 18/2, with marked surrounding vasogenic edema, stable. There are and least 5 other hyperdense intracranial masses again identified. The sub ependymal lesion in the right lateral ventricle now measures up to 1.1 x 0.6 cm, previously subcentimeter in size. Left frontal hyperdense lesion measures 1.0 x 0.8 cm in size,  previously 0.8 x 0.6 cm. I do not see any new metastatic lesions since the prior exam. Again, hyperdensity in these lesions suggest hemorrhagic component. No evidence of acute infarct. The lateral ventricles and remaining midline structures are stable. Chronic encephalomalacia within the cerebellum. No acute extra-axial fluid collections. No significant mass effect or midline shift. Vascular: No hyperdense vessel or unexpected calcification. Skull: Normal. Negative for fracture or focal lesion. Sinuses/Orbits: No acute finding. Other: None. IMPRESSION: 1. Multiple hyperdense intracranial lesions, consistent with hemorrhagic metastases. The dominant lesion in the left parietal lobe is grossly stable, with minimal increase in size of smaller index lesions as described above. 2. No significant mass effect or midline shift. 3. No new lesions since previous exam. 4. No evidence of acute infarct. Electronically Signed   By: Randa Ngo M.D.   On: 11/01/2021 16:32   DG Chest Portable 1 View  Result Date: 11/01/2021 CLINICAL  DATA:  Increased confusion and weakness. EXAM: PORTABLE CHEST 1 VIEW COMPARISON:  10/05/2021 FINDINGS: Bilateral pulmonary nodules again noted, similar to prior chest x-ray better characterized on CT chest 10/05/2021. The cardio pericardial silhouette is enlarged. There is pulmonary vascular congestion without overt pulmonary edema. No evidence for pneumothorax or pleural effusion. Telemetry leads overlie the chest. IMPRESSION: 1. Pulmonary vascular congestion without overt pulmonary edema. 2. Stable bilateral pulmonary nodules. Electronically Signed   By: Misty Stanley M.D.   On: 11/01/2021 16:39    Scheduled Meds:  carvedilol  6.25 mg Oral BID WC   dexamethasone  4 mg Oral TID   ferrous sulfate  325 mg Oral Q breakfast   folic acid  1 mg Oral Daily   furosemide  40 mg Oral Daily   hydrALAZINE  25 mg Oral Q8H   insulin aspart  0-9 Units Subcutaneous Q4H   levETIRAcetam  500 mg Oral BID   pantoprazole  40 mg Oral BID   simvastatin  40 mg Oral QHS   sucralfate  1 g Oral TID WC & HS   Continuous Infusions:     LOS: 1 day   Time spent: 35 minutes   Aylah Yeary Loann Quill, MD [facility]  If 7PM-7AM, please contact night-coverage [email or web address] 11/03/2021, 12:38 PM

## 2021-11-03 NOTE — Progress Notes (Signed)
Daily Progress Note   Patient Name: Natalie Contreras       Date: 11/03/2021 DOB: 1966-02-17  Age: 56 y.o. MRN#: [id number] Attending Physician: Mckinley Jewel, MD Primary Care Physician: Celene Squibb, MD Admit Date: 11/01/2021  Reason for Consultation/Follow-up: Establishing goals of care  Subjective: Resting in bed, appears in mild distress, discussed with [facility] MD, RN and also with son. See below.   Length of Stay: 1  Current Medications: Scheduled Meds:   carvedilol  6.25 mg Oral BID WC   dexamethasone  4 mg Oral TID   ferrous sulfate  325 mg Oral Q breakfast   folic acid  1 mg Oral Daily   furosemide  40 mg Oral Daily   hydrALAZINE  25 mg Oral Q8H   insulin aspart  0-9 Units Subcutaneous Q4H   levETIRAcetam  500 mg Oral BID   pantoprazole  40 mg Oral BID   simvastatin  40 mg Oral QHS   sucralfate  1 g Oral TID WC & HS    Continuous Infusions:   PRN Meds: acetaminophen **OR** acetaminophen, HYDROcodone-acetaminophen, LORazepam, oxyCODONE-acetaminophen  Physical Exam         General: Somnolent, frail chronically-ill appearing Cardiovascular: regular rate and rhythm Pulmonary: diminished bilaterally  Abdomen: soft, nontender, + bowel sounds Extremities: left extremity edema, right BKA Skin: no rashes, warm and dry.  Vital Signs: BP (!) 155/60    Pulse 70    Temp 97.8 F (36.6 C) (Oral)    Resp 18    Ht 5\' 4"  (1.626 m)    Wt 95.1 kg    LMP 12/16/2015    SpO2 99%    BMI 35.99 kg/m  SpO2: SpO2: 99 % O2 Device: O2 Device: Room Air O2 Flow Rate: O2 Flow Rate (L/min): 2 L/min  Intake/output summary:  Intake/Output Summary (Last 24 hours) at 11/03/2021 1154 Last data filed at 11/03/2021 1000 Gross per 24 hour  Intake 460.16 ml  Output 700 ml  Net -239.84 ml   LBM:  Last BM Date : 11/02/21 Baseline Weight: Weight: 95.1 kg Most recent weight: Weight: 95.1 kg       Palliative Assessment/Data:      Patient Active Problem List   Diagnosis Date Noted   UTI (urinary tract infection) 11/01/2021   Anemia 11/01/2021  Metastatic cancer to brain ([place]) 10/24/2021   Renal cell carcinoma ([place]) 10/24/2021   Ambulatory dysfunction 10/24/2021   Brain metastases ([place]) 10/17/2021   Melena    Blood in the stool    Symptomatic anemia 10/13/2021   DM2 (diabetes mellitus, type 2) ([place]) 10/13/2021   Metastatic disease ([place]) 10/13/2021   Left renal mass 10/13/2021   Drug induced constipation    Postoperative pain    Morbid obesity (HCC)    Acute blood loss anemia    Hypoalbuminemia due to protein-calorie malnutrition (HCC)    Stage 3 chronic kidney disease ([employer])    Diabetic peripheral neuropathy (HCC)    S/P BKA (below knee amputation) ([place]) 09/06/2019   Cutaneous abscess of right foot    Dehiscence of amputation stump ([place])    Status post skin graft 08/20/2019   Severe protein-calorie malnutrition ([place])    Gangrene of right foot ([place])    Abscess or cellulitis of foot 08/08/2019   Obesity (BMI 30-39.9) 08/08/2019   Wound infection 06/13/2019   Cellulitis 07/17/2017   Subacute osteomyelitis, right ankle and foot ([place])    Toe amputation status, left 05/05/2017   Gangrene of left foot ([place])    PAD (peripheral artery disease) ([place]) 04/10/2017   Essential hypertension 04/10/2017   Chronic diastolic CHF (congestive heart failure) ([place]) 04/10/2017   AKI (acute kidney injury) ([place]) 04/10/2017   Foreign body in left foot    Cellulitis and abscess of foot 04/07/2017   Carotid stenosis, asymptomatic, bilateral 01/15/2017   Visual disturbance 01/15/2017   Tobacco abuse 01/15/2017   Acute respiratory failure with hypoxia ([place]) 10/30/2016   Pulmonary hypertension ([place]) 25/01/3975   Acute diastolic CHF (congestive heart failure) ([place]) 10/28/2016   Benign  essential HTN 10/28/2016   Hyperlipidemia 10/28/2016   Diabetic infection of right foot ([place]) 10/28/2016    Palliative Care Assessment & Plan   Patient Profile:   Assessment: Metastatic renal cell cancer Mets to liver diffuse chest and abdominal wall soft tissue mets, brain mets, bilateral lung involvement.    Recommendations/Plan: Call placed and discussed with son, he has elected to proceed with comfort measures and residential hospice. Son wishes to keep her as comfortable as possible, is familiar with hospice care and knows that she will not be undergoing any more radiation.  Prognosis likely less than 2 weeks in my opinion.   Code Status:    Code Status Orders  (From admission, onward)           Start     Ordered   11/02/21 0529  Do not attempt resuscitation (DNR)  Continuous       Question Answer Comment  In the event of cardiac or respiratory ARREST Do not call a code blue   In the event of cardiac or respiratory ARREST Do not perform Intubation, CPR, defibrillation or ACLS   In the event of cardiac or respiratory ARREST Use medication by any route, position, wound care, and other measures to relive pain and suffering. May use oxygen, suction and manual treatment of airway obstruction as needed for comfort.      11/02/21 0528           Code Status History     Date Active Date Inactive Code Status Order ID Comments User Context   11/01/2021 2050 11/02/2021 0528 DNR [id number]  Toy Baker, MD ED   10/24/2021 0422 10/30/2021 2308 Full Code [id number]  Etta Quill, DO ED   10/13/2021 2153 10/18/2021 1915 Full Code  [id number]  Truett Mainland, DO Inpatient   09/06/2019 1425 09/16/2019 1517 Full Code [id number]  Flora Lipps Inpatient   09/03/2019 1552 09/06/2019 1352 Full Code [id number]  Persons, Bevely Palmer, [place] Inpatient   08/08/2019 1132 08/14/2019 2232 Full Code [id number]  Roxan Hockey, MD ED   06/13/2019 1800 06/15/2019 1757 Full Code [id number]   Roxan Hockey, MD Inpatient   07/17/2017 1707 07/23/2017 1604 Full Code [id number]  Phillips Grout, MD Inpatient   05/05/2017 1321 05/06/2017 1552 Full Code [id number]  Aviva Signs, MD Inpatient   04/07/2017 1700 04/15/2017 2117 Full Code [id number]  Tawni Millers, MD ED   10/28/2016 1307 11/01/2016 1552 Full Code [id number]  Kathie Dike, MD Inpatient      Advance Directive Documentation    Flowsheet Row Most Recent Value  Type of Advance Directive Out of facility DNR (pink MOST or yellow form)  Pre-existing out of facility DNR order (yellow form or pink MOST form) Yellow form placed in chart (order not valid for inpatient use)  [yellow form in the room]  "MOST" Form in Place? --       Prognosis:  < 2 weeks  Discharge Planning: Hospice facility  Care plan was discussed with  patient's son on the phone, bedside RN and also with Dr Doristine Bosworth.   Thank you for allowing the Palliative Medicine Team to assist in the care of this patient.   Time In: 11.30 Time Out: 12.05 Total Time 35 Prolonged Time Billed  no       Greater than 50%  of this time was spent counseling and coordinating care related to the above assessment and plan.  Loistine Chance, MD  Please contact Palliative Medicine Team phone at [phone] for questions and concerns.

## 2021-11-04 LAB — GLUCOSE, CAPILLARY
Glucose-Capillary: 258 mg/dL — ABNORMAL HIGH (ref 70–99)
Glucose-Capillary: 282 mg/dL — ABNORMAL HIGH (ref 70–99)
Glucose-Capillary: 295 mg/dL — ABNORMAL HIGH (ref 70–99)
Glucose-Capillary: 348 mg/dL — ABNORMAL HIGH (ref 70–99)
Glucose-Capillary: 357 mg/dL — ABNORMAL HIGH (ref 70–99)
Glucose-Capillary: 425 mg/dL — ABNORMAL HIGH (ref 70–99)
Glucose-Capillary: 468 mg/dL — ABNORMAL HIGH (ref 70–99)

## 2021-11-04 LAB — GLUCOSE, RANDOM: Glucose, Bld: 454 mg/dL — ABNORMAL HIGH (ref 70–99)

## 2021-11-04 NOTE — Progress Notes (Signed)
PROGRESS NOTE    Natalie Contreras  TDD:[id number] DOB: 1966/02/06 DOA: 11/01/2021 PCP: Celene Squibb, MD   Brief Narrative:  Natalie Contreras is a 56 y.o. 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  Was seen at any pain yesterday and diagnosed with UTI.  Currently she is undergoing treatment for metastatic renal cancer to multiple areas.  Family requested no aggressive work-up.  They are interested in hospice care. they agreed with comfort measures only.  Assessment & Plan:  Generalized weakness: -Patient presented with generalized weakness.  Afebrile.  UA  was concerning for infection on admission-patient started on Rocephin.   -Urine culture shows no growth.  Discontinued Rocephin on 2/18. -No aggressive measures as per family.  -Appreciate palliative care involvement -Patient is on full comfort care-family is interested in residential hospice-TOC consulted. Family is aware that patient will not get further radiation therapy while in residential hospice.  Metastatic cancer to brain: Renal cell carcinoma: -CT head shows multiple hyperdense intracranial lesions consistent with hemorrhagic metastasis. -Radiation treatment as per radiation oncology -Continue Decadron -Continue Keppra  Chronic diastolic CHF: Patient appears euvolemic on exam. -Strict INO's and daily weight. -Chest x-ray shows pulmonary vascular congestion without pulmonary edema. -Continue home Lasix  CKD stage IIIb: -At baseline.    Type 2 diabetes: -Stable.  A1c 5.8% -Continue sliding scale insulin.  Hold on Jardiance at this time  Hypertension: -Blood pressure is slightly elevated.  -Continue Coreg and  hydralazine  Hyperlipidemia: Continue statin  Status post right BKA: Chronic stable.  Severe protein calorie malnutrition: -Consult dietitian-appreciate help.  Prealbumin is 13.4  Normocytic anemia: H&H is stable.   -B12 level: WNL.  Low folic acid.  Low total iron and iron saturation. -Continue ferrous sulfate and folic acid  DVT prophylaxis: SCD Code Status: DNR Family Communication:  None present at bedside.  Plan of care discussed with patient in length and she verbalized understanding and agreed with it.  Disposition Plan: Residential hospice-likely on Monday  Consultants:  Palliative care  Procedures:  None  Antimicrobials:  Rocephin  Status is: Inpatient      Subjective: Patient seen and examined.  Denies any complaints.  No fever overnight.  No acute events overnight.  Objective: Vitals:   11/02/21 2013 11/03/21 0541 11/03/21 2103 11/04/21 0541  BP: (!) 128/49 (!) 155/60 (!) 152/67 (!) 158/58  Pulse: 70  64 64  Resp: 18  18 18   Temp: 97.8 F (36.6 C)  98 F (36.7 C) 98 F (36.7 C)  TempSrc: Oral  Oral Oral  SpO2: 99%  98% 95%  Weight:      Height:        Intake/Output Summary (Last 24 hours) at 11/04/2021 1347 Last data filed at 11/04/2021 1000 Gross per 24 hour  Intake 240 ml  Output 550 ml  Net -310 ml    Filed Weights   11/02/21 1726  Weight: 95.1 kg    Examination:  General exam: Appears calm and comfortable, appears sick, on room air, communicating well Respiratory system: Clear to auscultation. Respiratory effort normal.  His has bony prominence on left upper chest. Cardiovascular system: S1 & S2 heard, RRR. No JVD, murmurs, rubs, gallops or clicks. No pedal edema. Gastrointestinal system: Abdomen is nondistended, soft and nontender. No organomegaly  or masses felt. Normal bowel sounds heard. Central nervous system: Alert and oriented.  Extremities: Right BKA  skin: No rashes, lesions or ulcers Psychiatry: Judgement and insight  appear normal. Mood & affect appropriate.    Data Reviewed: I have personally reviewed following labs and imaging studies  CBC: Recent Labs  Lab 10/31/21 1620 11/01/21 1557 11/02/21 0553  WBC 9.3 7.8 7.1  NEUTROABS  --  5.7 5.4  HGB 9.0* 8.8* 7.9*  HCT 31.5* 30.4* 26.7*  MCV 83.6 83.1 82.9  PLT 385 346 016    Basic Metabolic Panel: Recent Labs  Lab 10/29/21 0422 10/31/21 1620 11/01/21 1557 11/02/21 0553  NA 137 134* 139  --   K 4.7 4.0 3.9  --   CL 107 106 108  --   CO2 22 18* 23  --   GLUCOSE 206* 187* 170*  --   BUN 72* 69* 69*  --   CREATININE 1.07* 1.22* 1.18*  --   CALCIUM 8.8* 8.7* 8.6*  --   MG  --   --  2.3 2.2  PHOS  --   --  3.8 3.5    GFR: Estimated Creatinine Clearance: 61 mL/min (A) (by C-G formula based on SCr of 1.18 mg/dL (H)). Liver Function Tests: Recent Labs  Lab 10/31/21 1620 11/01/21 1557  AST 17 15  ALT 31 31  ALKPHOS 160* 139*  BILITOT 0.4 0.4  PROT 6.3* 5.8*  ALBUMIN 3.1* 2.7*    No results for input(s): LIPASE, AMYLASE in the last 168 hours. No results for input(s): AMMONIA in the last 168 hours. Coagulation Profile: No results for input(s): INR, PROTIME in the last 168 hours. Cardiac Enzymes: Recent Labs  Lab 11/01/21 1557  CKTOTAL 12*    BNP (last 3 results) No results for input(s): PROBNP in the last 8760 hours. HbA1C: Recent Labs    11/01/21 2220  HGBA1C 5.8*    CBG: Recent Labs  Lab 11/03/21 2100 11/03/21 2353 11/04/21 0357 11/04/21 0731 11/04/21 1146  GLUCAP 293* 307* 295* 282* 348*    Lipid Profile: No results for input(s): CHOL, HDL, LDLCALC, TRIG, CHOLHDL, LDLDIRECT in the last 72 hours. Thyroid Function Tests: Recent Labs    11/02/21 0550  TSH 4.518*    Anemia Panel: Recent Labs    11/01/21 2220  VITAMINB12 428  FOLATE 4.8*  FERRITIN 356*  TIBC 230*  IRON 16*  RETICCTPCT 2.3    Sepsis Labs: No results for input(s): PROCALCITON, LATICACIDVEN in the last 168 hours.  Recent  Results (from the past 240 hour(s))  Urine Culture     Status: None   Collection Time: 11/01/21 10:20 PM   Specimen: Urine, Clean Catch  Result Value Ref Range Status   Specimen Description   Final    URINE, CLEAN CATCH Performed at [facility], [place] [address], [place], [place] [address]    Special Requests   Final    NONE Performed at [facility], [place] [address] [place], [place] [address]    Culture   Final    NO GROWTH Performed at [facility], [place] [address] [place], [place] [address]    Report Status 11/03/2021 FINAL  Final  Resp Panel by RT-PCR (Flu A&B, Covid) Nasopharyngeal Swab     Status: None   Collection Time: 11/01/21 10:20 PM   Specimen: Nasopharyngeal Swab; Nasopharyngeal(NP) swabs in vial transport medium  Result Value Ref Range Status   SARS Coronavirus 2 by RT PCR  NEGATIVE NEGATIVE Final    Comment: (NOTE) SARS-CoV-2 target nucleic acids are NOT DETECTED.  The SARS-CoV-2 RNA is generally detectable in upper respiratory specimens during the acute phase of infection. The lowest concentration of SARS-CoV-2 viral copies this assay can detect is 138 copies/mL. A negative result does not preclude SARS-Cov-2 infection and should not be used as the sole basis for treatment or other patient management decisions. A negative result may occur with  improper specimen collection/handling, submission of specimen other than nasopharyngeal swab, presence of viral mutation(s) within the areas targeted by this assay, and inadequate number of viral copies(<138 copies/mL). A negative result must be combined with clinical observations, patient history, and epidemiological information. The expected result is Negative.  Fact Sheet for Patients:  EntrepreneurPulse.com.au  Fact Sheet for Healthcare Providers:  IncredibleEmployment.be  This test is no t yet approved or cleared by the [place]  FDA and  has been authorized for detection and/or diagnosis of SARS-CoV-2 by FDA under an Emergency Use Authorization (EUA). This EUA will remain  in effect (meaning this test can be used) for the duration of the COVID-19 declaration under Section 564(b)(1) of the Act, 21 U.S.C.section 360bbb-3(b)(1), unless the authorization is terminated  or revoked sooner.       Influenza A by PCR NEGATIVE NEGATIVE Final   Influenza B by PCR NEGATIVE NEGATIVE Final    Comment: (NOTE) The Xpert Xpress SARS-CoV-2/FLU/RSV plus assay is intended as an aid in the diagnosis of influenza from Nasopharyngeal swab specimens and should not be used as a sole basis for treatment. Nasal washings and aspirates are unacceptable for Xpert Xpress SARS-CoV-2/FLU/RSV testing.  Fact Sheet for Patients: EntrepreneurPulse.com.au  Fact Sheet for Healthcare Providers: IncredibleEmployment.be  This test is not yet approved or cleared by the [place] FDA and has been authorized for detection and/or diagnosis of SARS-CoV-2 by FDA under an Emergency Use Authorization (EUA). This EUA will remain in effect (meaning this test can be used) for the duration of the COVID-19 declaration under Section 564(b)(1) of the Act, 21 U.S.C. section 360bbb-3(b)(1), unless the authorization is terminated or revoked.  Performed at [facility], [place] [address], [place], [place] [address]        Radiology Studies: No results found.  Scheduled Meds:  carvedilol  6.25 mg Oral BID WC   dexamethasone  4 mg Oral TID   ferrous sulfate  325 mg Oral Q breakfast   folic acid  1 mg Oral Daily   furosemide  40 mg Oral Daily   hydrALAZINE  25 mg Oral Q8H   insulin aspart  0-9 Units Subcutaneous Q4H   levETIRAcetam  500 mg Oral BID   pantoprazole  40 mg Oral BID   simvastatin  40 mg Oral QHS   sucralfate  1 g Oral TID WC & HS   Continuous Infusions:     LOS: 2 days   Time  spent: 35 minutes   Saffron Busey Loann Quill, MD [facility]  If 7PM-7AM, please contact night-coverage [email or web address] 11/04/2021, 1:47 PM

## 2021-11-04 NOTE — Progress Notes (Signed)
PT Cancellation Note  Patient Details Name: Natalie Contreras MRN: [id number] DOB: 1966-09-05   Cancelled Treatment:     Patient to transition to hospice services at this time. PT to sign off at this time.    Darreon Lutes 11/04/2021, 8:31 AM

## 2021-11-04 NOTE — Plan of Care (Signed)
  Problem: Education: Goal: Knowledge of General Education information will improve Description: Including pain rating scale, medication(s)/side effects and non-pharmacologic comfort measures Outcome: Progressing   Problem: Coping: Goal: Level of anxiety will decrease Outcome: Progressing   Problem: Pain Managment: Goal: General experience of comfort will improve Outcome: Progressing   

## 2021-11-04 NOTE — Progress Notes (Signed)
OT Cancellation Note  Patient Details Name: SIDDHI DORNBUSH MRN: [id number] DOB: 08-27-1966   Cancelled Treatment:    Reason Eval/Treat Not Completed: Other (comment) Patient to transition to hospice services at this time. OT to sign off at this time.  Jackelyn Poling OTR/L, [place] [facility] Office# [phone] Pager# [phone]   11/04/2021, 7:31 AM

## 2021-11-04 NOTE — TOC Progression Note (Addendum)
Transition of Care [facility]) - Progression Note    Patient Details  Name: DESEREA BORDLEY MRN: [id number] Date of Birth: [birth year]  [facility] CM/SW Contact  Ross Ludwig, [place] Phone Number: 11/04/2021, 1:24 PM  Clinical Narrative:     CSW received referral that patient and family have decided on inpatient hospice facility placement.  CSW spoke to patient's son Catalina Antigua and he confirmed they would like to to [facility] inpatient facility.  CSW contacted [facility], and they requested clinicals to be faxed to [phone].  CSW called the hospice house to see if clinicals have been received they confirmed receipt.  Per [facility] their physician has to review patient before she can be accepted, and they are not there till tomorrow.  Weekday TOC to follow up with [facility] inpatient [facility] facility on Monday.     Expected Discharge Plan and [place] [facility] inpatient facility.                                               Social Determinants of Health (SDOH) Interventions    Readmission Risk Interventions Readmission Risk Prevention Plan 10/18/2021 10/17/2021  Transportation Screening - Complete  PCP or Specialist Appt within 3-5 Days - Complete  HRI or Home Care Consult Complete -  Social Work Consult for [place] Planning/Counseling - Complete  Palliative Care Screening - Not Applicable  Medication Review Press photographer) - Complete  Some recent data might be hidden

## 2021-11-05 DIAGNOSIS — Z515 Encounter for palliative care: Secondary | ICD-10-CM

## 2021-11-05 LAB — RESP PANEL BY RT-PCR (FLU A&B, COVID) ARPGX2
Influenza A by PCR: NEGATIVE
Influenza B by PCR: NEGATIVE
SARS Coronavirus 2 by RT PCR: NEGATIVE

## 2021-11-05 LAB — GLUCOSE, CAPILLARY
Glucose-Capillary: 288 mg/dL — ABNORMAL HIGH (ref 70–99)
Glucose-Capillary: 259 mg/dL — ABNORMAL HIGH (ref 70–99)
Glucose-Capillary: 242 mg/dL — ABNORMAL HIGH (ref 70–99)

## 2021-11-05 MED ORDER — LEVETIRACETAM 500 MG PO TABS
500.0000 mg | ORAL_TABLET | Freq: Two times a day (BID) | ORAL | Status: AC
Start: 2021-11-05 — End: ?

## 2021-11-05 NOTE — TOC Transition Note (Signed)
Transition of Care [facility]) - CM/SW Discharge Note   Patient Details  Name: Natalie Contreras MRN: [id number] Date of Birth: [birth year]  Transition of Care [facility]) CM/SW Contact:  Lennart Pall, LCSW Phone Number: 11/05/2021, 3:12 PM   Clinical Narrative:    Notified that bed ready today at [facility]) and son, [name] aware dc today.  PTAR called at 3:15pm.  No further TOC needs.   Final next level of care: [place] Barriers to Discharge: Barriers Resolved   Patient Goals and CMS Choice        Discharge Placement                       Discharge Plan and Services                DME Arranged: N/A DME Agency: NA                  Social Determinants of Health (SDOH) Interventions     Readmission Risk Interventions Readmission Risk Prevention Plan 10/18/2021 10/17/2021  Transportation Screening - Complete  PCP or Specialist Appt within 3-5 Days - Complete  HRI or Home Care Consult Complete -  Social Work Consult for [place] Planning/Counseling - Complete  Palliative Care Screening - Not Applicable  Medication Review Press photographer) - Complete  Some recent data might be hidden

## 2021-11-05 NOTE — Discharge Summary (Signed)
Physician Discharge Summary  Natalie Contreras PPJ:[id number] DOB: 08-Dec-1965 DOA: 11/01/2021  PCP: Celene Squibb, MD  Admit date: 11/01/2021 Discharge date: 11/05/2021  Admitted From: home Discharge disposition: residential hospice   Recommendations for Outpatient Follow-Up:   D/c to residential hospice DNR   Discharge Diagnosis:   Principal Problem:   UTI (urinary tract infection) Active Problems:   Benign essential HTN   Hyperlipidemia   Chronic diastolic CHF (congestive heart failure) (HCC)   Obesity (BMI 30-39.9)   Severe protein-calorie malnutrition (HCC)   S/P BKA (below knee amputation) ([place])   Metastatic cancer to brain [facility])   Renal cell carcinoma [facility])   Ambulatory dysfunction   Anemia   Hospice care patient    Discharge Condition: terminal  Diet recommendation: regular vs carb mod  Wound care: None.  Code status: DNR   History of Present Illness:   Natalie Contreras is a 56 y.o. 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  Was seen at any pain yesterday and diagnosed with UTI.  Currently she is undergoing treatment for metastatic renal cancer to multiple areas.   Family requested no aggressive work-up.  They are interested in hospice care. they agreed with comfort measures only.   Hospital Course by Problem:   Generalized weakness: -Patient presented with generalized weakness.  Afebrile.  UA  was concerning for infection on admission-patient started on Rocephin.   -Urine culture shows no growth.  Discontinued Rocephin on 2/18. -No aggressive measures as per family.  -Appreciate palliative care involvement -Patient is on full comfort care-family is interested  in residential hospice-TOC consulted. Family is aware that patient will not get further radiation therapy while in residential hospice.   Metastatic cancer to brain: Renal cell carcinoma: -CT head shows multiple hyperdense intracranial lesions consistent with hemorrhagic metastasis. -Continue Decadron -Continue Keppra   Chronic diastolic CHF:  -Continue home Lasix   CKD stage IIIb: -At baseline.     Type 2 diabetes: -carb mod diet -transition to comfort   Hypertension: -Continue Coreg and hydralazine   Hyperlipidemia:  -comfort focused   Status post right BKA: Chronic stable.   Severe protein calorie malnutrition: -transition to comfort measures   Normocytic anemia: H&H is stable.   -transition to comfort measures    Medical Consultants:   Palliative care   Discharge Exam:   Vitals:   11/05/21 0900 11/05/21 0920  BP: (!) 166/52 (!) 156/60  Pulse: 62 64  Resp: 20   Temp: 97.6 F (36.4 C)   SpO2: 99%    Vitals:   11/04/21 2200 11/05/21 0537 11/05/21 0900 11/05/21 0920  BP: (!) 151/60 (!) 150/50 (!) 166/52 (!) 156/60  Pulse: 61 66 62 64  Resp:  16 20   Temp: 97.8 F (36.6 C) 97.6 F (36.4 C) 97.6 F (36.4 C)   TempSrc: Oral  Oral   SpO2:  95% 99%   Weight:      Height:        General exam: Appears calm and comfortable.   The results of significant diagnostics from this hospitalization (including imaging, microbiology, ancillary and laboratory) are listed below for reference.     Procedures and Diagnostic Studies:   No results found.   Labs:   Basic Metabolic Panel:  Recent Labs  Lab 10/31/21 1620 11/01/21 1557 11/02/21 0553 11/04/21 1718  NA 134* 139  --   --   K 4.0 3.9  --   --   CL 106 108  --   --   CO2 18* 23  --   --   GLUCOSE 187* 170*  --  454*  BUN 69* 69*  --   --   CREATININE 1.22* 1.18*  --   --   CALCIUM 8.7* 8.6*  --   --   MG  --  2.3 2.2  --   PHOS  --  3.8 3.5  --    GFR Estimated Creatinine Clearance: 61  mL/min (A) (by C-G formula based on SCr of 1.18 mg/dL (H)). Liver Function Tests: Recent Labs  Lab 10/31/21 1620 11/01/21 1557  AST 17 15  ALT 31 31  ALKPHOS 160* 139*  BILITOT 0.4 0.4  PROT 6.3* 5.8*  ALBUMIN 3.1* 2.7*   No results for input(s): LIPASE, AMYLASE in the last 168 hours. No results for input(s): AMMONIA in the last 168 hours. Coagulation profile No results for input(s): INR, PROTIME in the last 168 hours.  CBC: Recent Labs  Lab 10/31/21 1620 11/01/21 1557 11/02/21 0553  WBC 9.3 7.8 7.1  NEUTROABS  --  5.7 5.4  HGB 9.0* 8.8* 7.9*  HCT 31.5* 30.4* 26.7*  MCV 83.6 83.1 82.9  PLT 385 346 317   Cardiac Enzymes: Recent Labs  Lab 11/01/21 1557  CKTOTAL 12*   BNP: Invalid input(s): POCBNP CBG: Recent Labs  Lab 11/04/21 1647 11/04/21 1949 11/04/21 2340 11/05/21 0540 11/05/21 0732  GLUCAP 468* 357* 258* 288* 259*   D-Dimer No results for input(s): DDIMER in the last 72 hours. Hgb A1c No results for input(s): HGBA1C in the last 72 hours. Lipid Profile No results for input(s): CHOL, HDL, LDLCALC, TRIG, CHOLHDL, LDLDIRECT in the last 72 hours. Thyroid function studies No results for input(s): TSH, T4TOTAL, T3FREE, THYROIDAB in the last 72 hours.  Invalid input(s): FREET3 Anemia work up No results for input(s): VITAMINB12, FOLATE, FERRITIN, TIBC, IRON, RETICCTPCT in the last 72 hours. Microbiology Recent Results (from the past 240 hour(s))  Urine Culture     Status: None   Collection Time: 11/01/21 10:20 PM   Specimen: Urine, Clean Catch  Result Value Ref Range Status   Specimen Description   Final    URINE, CLEAN CATCH Performed at [facility], [place] [address] [place], [place] [address]    Special Requests   Final    NONE Performed at [facility], [place] [address], [place], [place] [address]    Culture   Final    NO GROWTH Performed at [facility], [place] [address], [place], [place] [address]     Report Status 11/03/2021 FINAL  Final  Resp Panel by RT-PCR (Flu A&B, Covid) Nasopharyngeal Swab     Status: None   Collection Time: 11/01/21 10:20 PM   Specimen: Nasopharyngeal Swab; Nasopharyngeal(NP) swabs in vial transport medium  Result Value Ref Range Status   SARS Coronavirus 2 by RT PCR NEGATIVE NEGATIVE Final    Comment: (NOTE) SARS-CoV-2 target nucleic acids are NOT DETECTED.  The SARS-CoV-2 RNA is generally detectable in upper respiratory specimens during the acute phase of infection. The lowest concentration of SARS-CoV-2 viral copies this assay can detect is 138 copies/mL. A negative result does not preclude SARS-Cov-2 infection and should not be used as the sole basis for  treatment or other patient management decisions. A negative result may occur with  improper specimen collection/handling, submission of specimen other than nasopharyngeal swab, presence of viral mutation(s) within the areas targeted by this assay, and inadequate number of viral copies(<138 copies/mL). A negative result must be combined with clinical observations, patient history, and epidemiological information. The expected result is Negative.  Fact Sheet for Patients:  EntrepreneurPulse.com.au  Fact Sheet for Healthcare Providers:  IncredibleEmployment.be  This test is no t yet approved or cleared by the [place] FDA and  has been authorized for detection and/or diagnosis of SARS-CoV-2 by FDA under an Emergency Use Authorization (EUA). This EUA will remain  in effect (meaning this test can be used) for the duration of the COVID-19 declaration under Section 564(b)(1) of the Act, 21 U.S.C.section 360bbb-3(b)(1), unless the authorization is terminated  or revoked sooner.       Influenza A by PCR NEGATIVE NEGATIVE Final   Influenza B by PCR NEGATIVE NEGATIVE Final    Comment: (NOTE) The Xpert Xpress SARS-CoV-2/FLU/RSV plus assay is intended as an aid in  the diagnosis of influenza from Nasopharyngeal swab specimens and should not be used as a sole basis for treatment. Nasal washings and aspirates are unacceptable for Xpert Xpress SARS-CoV-2/FLU/RSV testing.  Fact Sheet for Patients: EntrepreneurPulse.com.au  Fact Sheet for Healthcare Providers: IncredibleEmployment.be  This test is not yet approved or cleared by the [place] FDA and has been authorized for detection and/or diagnosis of SARS-CoV-2 by FDA under an Emergency Use Authorization (EUA). This EUA will remain in effect (meaning this test can be used) for the duration of the COVID-19 declaration under Section 564(b)(1) of the Act, 21 U.S.C. section 360bbb-3(b)(1), unless the authorization is terminated or revoked.  Performed at [facility], [place] [address] [place], [place] [address]      Discharge Instructions:   Discharge Instructions     Diet Carb Modified   Complete by: As directed    Increase activity slowly   Complete by: As directed       Allergies as of 11/05/2021   No Known Allergies      Medication List     STOP taking these medications    cephALEXin 500 MG capsule Commonly known as: Keflex   ergocalciferol 1.25 MG (50000 UT) capsule Commonly known as: VITAMIN D2   hydrocortisone 25 MG suppository Commonly known as: ANUSOL-HC   insulin aspart 100 UNIT/ML injection Commonly known as: novoLOG   LORazepam 1 MG tablet Commonly known as: ATIVAN   multivitamin with minerals Tabs tablet   Omnipod DASH Pods (Gen 4) Misc   ondansetron 4 MG tablet Commonly known as: ZOFRAN   simvastatin 40 MG tablet Commonly known as: ZOCOR   sucralfate 1 GM/10ML suspension Commonly known as: CARAFATE   torsemide 20 MG tablet Commonly known as: DEMADEX       TAKE these medications    acetaminophen 325 MG tablet Commonly known as: TYLENOL Take 1-2 tablets (325-650 mg total) by mouth  every 4 (four) hours as needed for mild pain.   carvedilol 6.25 MG tablet Commonly known as: COREG Take 1 tablet (6.25 mg total) by mouth 2 (two) times daily with a meal.   dexamethasone 4 MG tablet Commonly known as: DECADRON Take 1 tablet (4 mg total) by mouth 3 (three) times daily for 10 days.   Dexcom G6 Sensor Misc Inject 1 Device into the skin See admin instructions. Place 1 device/sensor into the skin every 10 days,  after removing the former one   furosemide 40 MG tablet Commonly known as: LASIX Take 1 tablet (40 mg total) by mouth daily.   hydrALAZINE 25 MG tablet Commonly known as: APRESOLINE Take 1 tablet (25 mg total) by mouth every 8 (eight) hours.   Jardiance 25 MG Tabs tablet Generic drug: empagliflozin Take 25 mg by mouth daily.   levETIRAcetam 500 MG tablet Commonly known as: KEPPRA Take 1 tablet (500 mg total) by mouth 2 (two) times daily.   oxyCODONE-acetaminophen 5-325 MG tablet Commonly known as: PERCOCET/ROXICET Take 1 tablet by mouth every 6 (six) hours as needed for moderate pain or severe pain.   pantoprazole 40 MG tablet Commonly known as: PROTONIX Take 1 tablet (40 mg total) by mouth 2 (two) times daily.   polyethylene glycol 17 g packet Commonly known as: MIRALAX / GLYCOLAX Take 17 g by mouth 2 (two) times daily. What changed:  when to take this reasons to take this          Time coordinating discharge: 35 min  Signed:  Geradine Girt DO  [facility] 11/05/2021, 9:46 AM

## 2021-11-05 NOTE — Plan of Care (Signed)
Problem: Pain Managment: Goal: General experience of comfort will improve Outcome: Progressing   Problem: Safety: Goal: Ability to remain free from injury will improve Outcome: Progressing   Problem: Coping: Goal: Level of anxiety will decrease Outcome: Sutton V Beckam Abdulaziz, RN 11/05/21 12:31 PM

## 2021-11-05 NOTE — Plan of Care (Signed)
Patient discharged to [facility] via EMS. Son Durenda Hurt notified that patient leaving facility. Report called to Amy, Therapist, sports at [facility]. Ivan Anchors, RN 11/05/21 4:49 PM

## 2021-11-05 NOTE — Plan of Care (Signed)
  Problem: Pain Managment: Goal: General experience of comfort will improve Outcome: Progressing   Problem: Safety: Goal: Ability to remain free from injury will improve Outcome: Progressing   

## 2021-11-21 NOTE — Progress Notes (Signed)
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  Narrative: The patient tolerated radiation therapy relatively well.   Plan: The patient will follow-up with radiation oncology in 1mo She is still an inpatient.  -----------------------------------  SEppie Gibson MD

## 2021-12-04 ENCOUNTER — Ambulatory Visit: Payer: Medicaid Other | Admitting: Radiation Oncology

## 2021-12-15 DEATH — deceased

## 2022-04-02 ENCOUNTER — Other Ambulatory Visit: Payer: Self-pay | Admitting: Radiation Therapy
